# Patient Record
Sex: Male | Born: 1935 | Race: Black or African American | Hispanic: No | Marital: Married | Smoking: Former smoker
Health system: Southern US, Community
[De-identification: ages and names within clinical notes are randomized; demographics above are authoritative.]

## PROBLEM LIST (undated history)

## (undated) DIAGNOSIS — Z8546 Personal history of malignant neoplasm of prostate: Secondary | ICD-10-CM

## (undated) DIAGNOSIS — Z860100 Personal history of colon polyps, unspecified: Secondary | ICD-10-CM

## (undated) DIAGNOSIS — N281 Cyst of kidney, acquired: Secondary | ICD-10-CM

## (undated) DIAGNOSIS — E785 Hyperlipidemia, unspecified: Secondary | ICD-10-CM

## (undated) DIAGNOSIS — K219 Gastro-esophageal reflux disease without esophagitis: Secondary | ICD-10-CM

## (undated) DIAGNOSIS — I10 Essential (primary) hypertension: Secondary | ICD-10-CM

## (undated) DIAGNOSIS — I509 Heart failure, unspecified: Secondary | ICD-10-CM

## (undated) DIAGNOSIS — M1990 Unspecified osteoarthritis, unspecified site: Secondary | ICD-10-CM

## (undated) HISTORY — DX: Gastro-esophageal reflux disease without esophagitis: K21.9

## (undated) HISTORY — DX: Cyst of kidney, acquired: N28.1

## (undated) HISTORY — DX: Unspecified osteoarthritis, unspecified site: M19.90

## (undated) HISTORY — PX: COLONOSCOPY: SHX174

## (undated) HISTORY — DX: Personal history of malignant neoplasm of prostate: Z85.46

## (undated) HISTORY — DX: Personal history of colon polyps, unspecified: Z86.0100

## (undated) HISTORY — DX: Personal history of colonic polyps: Z86.010

## (undated) HISTORY — DX: Essential (primary) hypertension: I10

## (undated) HISTORY — DX: Hyperlipidemia, unspecified: E78.5

---

## 1996-10-07 ENCOUNTER — Encounter: Payer: Self-pay | Admitting: Family Medicine

## 1997-11-07 ENCOUNTER — Encounter: Payer: Self-pay | Admitting: Family Medicine

## 1997-11-07 LAB — CONVERTED CEMR LAB: PSA: 4.8 ng/mL

## 1998-12-08 ENCOUNTER — Encounter: Payer: Self-pay | Admitting: Family Medicine

## 1998-12-08 LAB — CONVERTED CEMR LAB: PSA: 6.2 ng/mL

## 1999-02-08 HISTORY — PX: PROSTATECTOMY: SHX69

## 1999-02-08 HISTORY — PX: INGUINAL HERNIA REPAIR: SUR1180

## 1999-02-12 ENCOUNTER — Other Ambulatory Visit: Admission: RE | Admit: 1999-02-12 | Discharge: 1999-02-12 | Payer: Self-pay | Admitting: Urology

## 1999-02-12 HISTORY — PX: CYSTOSCOPY: SUR368

## 1999-03-21 ENCOUNTER — Encounter: Payer: Self-pay | Admitting: Urology

## 1999-03-26 ENCOUNTER — Encounter (INDEPENDENT_AMBULATORY_CARE_PROVIDER_SITE_OTHER): Payer: Self-pay | Admitting: Specialist

## 1999-03-26 ENCOUNTER — Inpatient Hospital Stay (HOSPITAL_COMMUNITY): Admission: RE | Admit: 1999-03-26 | Discharge: 1999-03-29 | Payer: Self-pay | Admitting: General Surgery

## 2002-01-05 ENCOUNTER — Encounter: Payer: Self-pay | Admitting: Urology

## 2002-01-05 ENCOUNTER — Encounter: Admission: RE | Admit: 2002-01-05 | Discharge: 2002-01-05 | Payer: Self-pay | Admitting: Urology

## 2005-06-18 ENCOUNTER — Ambulatory Visit: Payer: Self-pay | Admitting: Internal Medicine

## 2005-06-23 ENCOUNTER — Ambulatory Visit: Payer: Self-pay | Admitting: Internal Medicine

## 2005-07-09 ENCOUNTER — Ambulatory Visit: Payer: Self-pay | Admitting: Family Medicine

## 2005-08-08 ENCOUNTER — Ambulatory Visit: Payer: Self-pay | Admitting: Family Medicine

## 2006-06-03 ENCOUNTER — Emergency Department (HOSPITAL_COMMUNITY): Admission: EM | Admit: 2006-06-03 | Discharge: 2006-06-03 | Payer: Self-pay | Admitting: Emergency Medicine

## 2006-08-11 ENCOUNTER — Ambulatory Visit: Payer: Self-pay | Admitting: Family Medicine

## 2006-08-18 ENCOUNTER — Ambulatory Visit: Payer: Self-pay | Admitting: Family Medicine

## 2006-08-18 LAB — CONVERTED CEMR LAB
BUN: 9 mg/dL (ref 6–23)
CO2: 30 meq/L (ref 19–32)
Calcium: 8.8 mg/dL (ref 8.4–10.5)
Chloride: 103 meq/L (ref 96–112)
Creatinine, Ser: 1.3 mg/dL (ref 0.4–1.5)
GFR calc Af Amer: 69 mL/min
GFR calc non Af Amer: 57 mL/min
Glucose, Bld: 99 mg/dL (ref 70–99)
Potassium: 3.3 meq/L — ABNORMAL LOW (ref 3.5–5.1)
Sodium: 141 meq/L (ref 135–145)

## 2006-09-24 ENCOUNTER — Ambulatory Visit: Payer: Self-pay | Admitting: Family Medicine

## 2006-09-24 LAB — CONVERTED CEMR LAB
BUN: 8 mg/dL (ref 6–23)
CO2: 30 meq/L (ref 19–32)
Calcium: 8.9 mg/dL (ref 8.4–10.5)
Chloride: 103 meq/L (ref 96–112)
Creatinine, Ser: 1.4 mg/dL (ref 0.4–1.5)
GFR calc Af Amer: 64 mL/min
GFR calc non Af Amer: 53 mL/min
Glucose, Bld: 106 mg/dL — ABNORMAL HIGH (ref 70–99)
Potassium: 3.7 meq/L (ref 3.5–5.1)
Sodium: 142 meq/L (ref 135–145)

## 2006-11-25 ENCOUNTER — Encounter: Payer: Self-pay | Admitting: Family Medicine

## 2006-11-25 DIAGNOSIS — E7800 Pure hypercholesterolemia, unspecified: Secondary | ICD-10-CM

## 2006-11-25 DIAGNOSIS — K219 Gastro-esophageal reflux disease without esophagitis: Secondary | ICD-10-CM | POA: Insufficient documentation

## 2006-11-25 DIAGNOSIS — R739 Hyperglycemia, unspecified: Secondary | ICD-10-CM

## 2006-11-25 DIAGNOSIS — A048 Other specified bacterial intestinal infections: Secondary | ICD-10-CM | POA: Insufficient documentation

## 2006-11-25 DIAGNOSIS — I10 Essential (primary) hypertension: Secondary | ICD-10-CM | POA: Insufficient documentation

## 2006-11-27 ENCOUNTER — Ambulatory Visit: Payer: Self-pay | Admitting: Family Medicine

## 2007-02-19 ENCOUNTER — Ambulatory Visit: Payer: Self-pay | Admitting: Family Medicine

## 2007-02-19 LAB — CONVERTED CEMR LAB
Albumin: 3.6 g/dL (ref 3.5–5.2)
Alkaline Phosphatase: 68 units/L (ref 39–117)
BUN: 9 mg/dL (ref 6–23)
Chloride: 106 meq/L (ref 96–112)
Creatinine, Ser: 1.3 mg/dL (ref 0.4–1.5)
Creatinine,U: 45.3 mg/dL
GFR calc non Af Amer: 57 mL/min
LDL Cholesterol: 115 mg/dL — ABNORMAL HIGH (ref 0–99)
Microalb, Ur: 5.6 mg/dL — ABNORMAL HIGH (ref 0.0–1.9)
Potassium: 3.8 meq/L (ref 3.5–5.1)
Sodium: 143 meq/L (ref 135–145)
TSH: 2.48 microintl units/mL (ref 0.35–5.50)
Total Bilirubin: 0.9 mg/dL (ref 0.3–1.2)
Triglycerides: 92 mg/dL (ref 0–149)
VLDL: 18 mg/dL (ref 0–40)

## 2007-02-23 ENCOUNTER — Ambulatory Visit: Payer: Self-pay | Admitting: Family Medicine

## 2007-02-23 DIAGNOSIS — Z87891 Personal history of nicotine dependence: Secondary | ICD-10-CM

## 2007-03-15 ENCOUNTER — Ambulatory Visit: Payer: Self-pay | Admitting: Family Medicine

## 2007-03-26 ENCOUNTER — Ambulatory Visit: Payer: Self-pay | Admitting: Family Medicine

## 2007-03-26 LAB — CONVERTED CEMR LAB
BUN: 12 mg/dL (ref 6–23)
CO2: 31 meq/L (ref 19–32)
Calcium: 9.2 mg/dL (ref 8.4–10.5)
GFR calc Af Amer: 63 mL/min
GFR calc non Af Amer: 52 mL/min
Potassium: 4.4 meq/L (ref 3.5–5.1)

## 2007-05-26 ENCOUNTER — Ambulatory Visit: Payer: Self-pay | Admitting: Family Medicine

## 2007-05-26 LAB — CONVERTED CEMR LAB
BUN: 10 mg/dL (ref 6–23)
Creatinine, Ser: 1.4 mg/dL (ref 0.4–1.5)
Potassium: 4.5 meq/L (ref 3.5–5.1)

## 2007-07-01 ENCOUNTER — Encounter: Payer: Self-pay | Admitting: Internal Medicine

## 2007-10-04 ENCOUNTER — Ambulatory Visit: Payer: Self-pay | Admitting: Family Medicine

## 2008-03-03 ENCOUNTER — Ambulatory Visit: Payer: Self-pay | Admitting: Family Medicine

## 2008-03-06 LAB — CONVERTED CEMR LAB
AST: 21 units/L (ref 0–37)
Albumin: 3.5 g/dL (ref 3.5–5.2)
Alkaline Phosphatase: 67 units/L (ref 39–117)
BUN: 11 mg/dL (ref 6–23)
Basophils Relative: 0.2 % (ref 0.0–3.0)
Creatinine, Ser: 1.4 mg/dL (ref 0.4–1.5)
Creatinine,U: 61.6 mg/dL
Eosinophils Absolute: 0.2 10*3/uL (ref 0.0–0.7)
Eosinophils Relative: 3.1 % (ref 0.0–5.0)
GFR calc Af Amer: 63 mL/min
Glucose, Bld: 103 mg/dL — ABNORMAL HIGH (ref 70–99)
HCT: 39.8 % (ref 39.0–52.0)
Hemoglobin: 13.8 g/dL (ref 13.0–17.0)
MCV: 89.5 fL (ref 78.0–100.0)
Microalb Creat Ratio: 76.3 mg/g — ABNORMAL HIGH (ref 0.0–30.0)
Monocytes Absolute: 0.8 10*3/uL (ref 0.1–1.0)
Monocytes Relative: 9.4 % (ref 3.0–12.0)
Neutro Abs: 5.8 10*3/uL (ref 1.4–7.7)
Platelets: 239 10*3/uL (ref 150–400)
Potassium: 4 meq/L (ref 3.5–5.1)
RBC: 4.44 M/uL (ref 4.22–5.81)
Total CHOL/HDL Ratio: 3.9
Total Protein: 7.5 g/dL (ref 6.0–8.3)
WBC: 8 10*3/uL (ref 4.5–10.5)

## 2008-03-09 ENCOUNTER — Ambulatory Visit: Payer: Self-pay | Admitting: Family Medicine

## 2008-03-09 DIAGNOSIS — K439 Ventral hernia without obstruction or gangrene: Secondary | ICD-10-CM | POA: Insufficient documentation

## 2008-03-24 ENCOUNTER — Ambulatory Visit: Payer: Self-pay | Admitting: Family Medicine

## 2008-03-24 LAB — CONVERTED CEMR LAB: OCCULT 3: NEGATIVE

## 2008-04-28 ENCOUNTER — Ambulatory Visit: Payer: Self-pay | Admitting: Family Medicine

## 2008-04-28 LAB — CONVERTED CEMR LAB
OCCULT 1: NEGATIVE
OCCULT 3: NEGATIVE

## 2008-04-28 LAB — FECAL OCCULT BLOOD, GUAIAC: Fecal Occult Blood: NEGATIVE

## 2008-05-01 ENCOUNTER — Encounter (INDEPENDENT_AMBULATORY_CARE_PROVIDER_SITE_OTHER): Payer: Self-pay | Admitting: *Deleted

## 2008-06-19 ENCOUNTER — Ambulatory Visit: Payer: Self-pay | Admitting: Family Medicine

## 2008-07-07 ENCOUNTER — Encounter: Payer: Self-pay | Admitting: Family Medicine

## 2009-01-03 ENCOUNTER — Encounter: Payer: Self-pay | Admitting: Family Medicine

## 2009-03-09 ENCOUNTER — Ambulatory Visit: Payer: Self-pay | Admitting: Family Medicine

## 2009-03-11 LAB — CONVERTED CEMR LAB
ALT: 12 units/L (ref 0–53)
BUN: 10 mg/dL (ref 6–23)
Basophils Absolute: 0 10*3/uL (ref 0.0–0.1)
Chloride: 106 meq/L (ref 96–112)
Cholesterol: 184 mg/dL (ref 0–200)
Creatinine,U: 36.6 mg/dL
Eosinophils Absolute: 0.1 10*3/uL (ref 0.0–0.7)
Glucose, Bld: 93 mg/dL (ref 70–99)
HCT: 42.2 % (ref 39.0–52.0)
Lymphs Abs: 1.1 10*3/uL (ref 0.7–4.0)
MCHC: 33 g/dL (ref 30.0–36.0)
MCV: 90.5 fL (ref 78.0–100.0)
Microalb Creat Ratio: 147.5 mg/g — ABNORMAL HIGH (ref 0.0–30.0)
Microalb, Ur: 5.4 mg/dL — ABNORMAL HIGH (ref 0.0–1.9)
Monocytes Absolute: 0.6 10*3/uL (ref 0.1–1.0)
Platelets: 174 10*3/uL (ref 150.0–400.0)
Potassium: 3.7 meq/L (ref 3.5–5.1)
RDW: 14.4 % (ref 11.5–14.6)
TSH: 3.01 microintl units/mL (ref 0.35–5.50)
Total Bilirubin: 0.8 mg/dL (ref 0.3–1.2)
Triglycerides: 80 mg/dL (ref 0.0–149.0)

## 2009-03-13 ENCOUNTER — Ambulatory Visit: Payer: Self-pay | Admitting: Family Medicine

## 2009-03-28 ENCOUNTER — Ambulatory Visit: Payer: Self-pay | Admitting: Family Medicine

## 2009-03-28 LAB — CONVERTED CEMR LAB: OCCULT 1: NEGATIVE

## 2009-04-03 ENCOUNTER — Telehealth: Payer: Self-pay | Admitting: Family Medicine

## 2009-04-24 ENCOUNTER — Ambulatory Visit: Payer: Self-pay | Admitting: Family Medicine

## 2009-04-24 LAB — CONVERTED CEMR LAB
BUN: 10 mg/dL (ref 6–23)
GFR calc non Af Amer: 68.64 mL/min (ref >60)
Glucose, Bld: 99 mg/dL (ref 70–99)
Potassium: 4 meq/L (ref 3.5–5.1)

## 2009-05-01 ENCOUNTER — Ambulatory Visit: Payer: Self-pay | Admitting: Family Medicine

## 2009-07-03 ENCOUNTER — Ambulatory Visit: Payer: Self-pay | Admitting: Urology

## 2009-07-03 ENCOUNTER — Encounter: Payer: Self-pay | Admitting: Family Medicine

## 2009-07-10 ENCOUNTER — Ambulatory Visit: Payer: Self-pay | Admitting: Family Medicine

## 2009-08-20 ENCOUNTER — Ambulatory Visit: Payer: Self-pay | Admitting: Family Medicine

## 2009-09-10 ENCOUNTER — Ambulatory Visit: Payer: Self-pay | Admitting: Family Medicine

## 2010-01-15 ENCOUNTER — Encounter (INDEPENDENT_AMBULATORY_CARE_PROVIDER_SITE_OTHER): Payer: Self-pay | Admitting: *Deleted

## 2010-07-09 NOTE — Letter (Signed)
Summary: Dr.[place] Humphries,[facility],Note  Dr.[place] Humphries,[facility],Note   Imported By: Virgia Land 07/03/2009 13:50:04  _____________________________________________________________________  External Attachment:    Type:   Image     Comment:   External Document  Appended Document: Dr.[place] Humphries,[facility],Note    Clinical Lists Changes  Problems: Added new problem of CONGEN POLYCYSTIC KIDNEY AUTOSOMAL DOMINANT, DR HUMPHRIES ([id number])

## 2010-07-09 NOTE — Assessment & Plan Note (Signed)
Summary: [place] F/U FOR BP CHECK / LFW   Vital Signs:  Patient profile:   77 year old male Weight:      166.50 pounds Temp:     97.9 degrees F oral Pulse rate:   80 / minute Pulse rhythm:   regular BP sitting:   120 / 74  (left arm) Cuff size:   regular  Vitals Entered By: Emelia Salisbury LPN (April  4, 624THL [id number] AM) CC: 2 Month follow-up on BP   History of Present Illness: Pt here for two month reche4ck of BP, tolerating his medication well. He was found to have shingles last time and is improving. The secondary bacrterial infection responded well to the Keflex and the lesions are resolving. He sdtill has a few large scabs in the lumbar area, the thigh area resolving.  Problems Prior to Update: 1)  Herpes Zoster, Right L3 Distr  (ICD-053.9) 2)  Congen Polycystic Kidney Autosomal Dominant, Dr Reece Agar  [phone]) 3)  Ventral Hernia, Asymptomatic  (ICD-553.20) 4)  Screening For Malignannt Neoplasm, Site Nec  (ICD-V76.49) 5)  Hypercholesterolemia  (ICD-272.0) 6)  Hyperglycemia  (ICD-790.6) 7)  Tobacco Abuse, Hx of  ([id number]) 8)  Helicobacter Pylori Infection  (ICD-041.86) 9)  Hypertension  (ICD-401.9) 10)  Gerd  (ICD-530.81) 11)  Prostate Cancer, Hx of (DR HUMPHRIES)  (ICD-V10.46)  Medications Prior to Update: 1)  Caduet 10-10 Mg Tabs (Amlodipine-Atorvastatin) .Marland Kitchen.. 1 By Mouth By Mouth At Night 2)  Furosemide 20 Mg Tabs (Furosemide) .... One Tab By Mouth Every Am 3)  Prilosec 20 Mg  Cpdr (Omeprazole) .Marland Kitchen.. 1 By Mouth Daily 4)  Klor-Con 10 10 Meq Cr-Tabs (Potassium Chloride) .Marland Kitchen.. 1 Daily By Mouth 5)  Lisinopril 20 Mg Tabs (Lisinopril) .... One Tab By Mouth Daily 6)  Vicodin 5-500 Mg Tabs (Hydrocodone-Acetaminophen) .... One Tab By Mouth Two Times A Day As Needed Pain From Shingles 7)  Keflex 250 Mg Caps (Cephalexin) .... One Tab By Mouth 4 Times A Day  Allergies: No Known Drug Allergies  Physical Exam  General:  Well-developed,well-nourished,in no acute distress;  alert,appropriate and cooperative throughout examination Head:  Normocephalic and atraumatic without obvious abnormalities. No apparent alopecia or balding. Eyes:  Conjunctiva clear bilaterally.  Ears:  External ear exam shows no significant lesions or deformities.  Otoscopic examination reveals clear canals, tympanic membranes are intact bilaterally without bulging, retraction, inflammation or discharge. Hearing is grossly normal bilaterally. Nose:  External nasal examination shows no deformity or inflammation. Nasal mucosa are pink and moist without lesions or exudates. Mouth:  Oral mucosa and oropharynx without lesions or exudates.  Teeth in mild  repair. Neck:  No deformities, masses, or tenderness noted. Lungs:  Normal respiratory effort, chest expands symmetrically. Lungs are clear to auscultation, no crackles or wheezes. Heart:  Normal rate and regular rhythm. S1 and S2 normal without gallop, murmur, click, rub or other extra sounds. Skin:  Lower back around to inguinal area and down the entire anterior right thigh, resolving with scabs on confluent areas of lower back but no new lesions seen.    Impression & Recommendations:  Problem # 1:  HYPERTENSION (ICD-401.9) Assessment Improved Stable, cont curr meds. His updated medication list for this problem includes:    Caduet 10-10 Mg Tabs (Amlodipine-atorvastatin) .Marland Kitchen... 1 by mouth by mouth at night    Furosemide 20 Mg Tabs (Furosemide) ..... One tab by mouth every am    Lisinopril 20 Mg Tabs (Lisinopril) ..... One tab by mouth daily    Metoprolol  Succinate 50 Mg Xr24h-tab (Metoprolol succinate) .Marland Kitchen... Take one by mouth every am  BP today: 120/74 Prior BP: 160/80 (08/20/2009)  Labs Reviewed: K+: 4.0 (04/24/2009) Creat: : 1.3 (04/24/2009)   Chol: 184 (03/09/2009)   HDL: 46.70 (03/09/2009)   LDL: 121 (03/09/2009)   TG: 80.0 (03/09/2009)  Problem # 2:  HERPES ZOSTER, RIGHT L3 DISTR (ICD-053.9) Assessment: Improved Resolving but still  hurting. Refill Vicodin but take as sparingly as possible.  Complete Medication List: 1)  Caduet 10-10 Mg Tabs (Amlodipine-atorvastatin) .Marland Kitchen.. 1 by mouth by mouth at night 2)  Furosemide 20 Mg Tabs (Furosemide) .... One tab by mouth every am 3)  Prilosec 20 Mg Cpdr (Omeprazole) .Marland Kitchen.. 1 by mouth daily 4)  Klor-con 10 10 Meq Cr-tabs (Potassium chloride) .Marland Kitchen.. 1 daily by mouth 5)  Lisinopril 20 Mg Tabs (Lisinopril) .... One tab by mouth daily 6)  Metoprolol Succinate 50 Mg Xr24h-tab (Metoprolol succinate) .... Take one by mouth every am 7)  Vicodin 5-500 Mg Tabs (Hydrocodone-acetaminophen) .... One tab by mouth two times a day as needed for shingles pain.  Patient Instructions: 1)  Zostavax next time if hasn't gotten it. 2)  Discussed discussing with his insurance. 3)  Call for appt in Fall for Comp Exam. Prescriptions: VICODIN 5-500 MG TABS (HYDROCODONE-ACETAMINOPHEN) one tab by mouth two times a day as needed for shingles pain.  #60 x 0   Entered and Authorized by:   Raenette Rover MD   Signed by:   Raenette Rover MD on 09/10/2009   Method used:   Print then Give to Patient   RxID:   [id number]   Current Allergies (reviewed today): No known allergies

## 2010-07-09 NOTE — Assessment & Plan Note (Signed)
Summary: RASH ON BACK AND LEGS/NT   Vital Signs:  Patient profile:   77 year old male Weight:      172 pounds Temp:     97.8 degrees F oral Pulse rate:   76 / minute Pulse rhythm:   regular BP sitting:   160 / 80  (left arm) Cuff size:   regular  Vitals Entered By: Emelia Salisbury LPN (March 14, 624THL [id number] AM) CC: Rash on legs and back, did not take his Caduet last night because he was out of it, will pick up today at the pharmacy   History of Present Illness: Pt here for rash which broke out last Wed or Thu, does not itch but is on his lowwer back and anterior thigh.  Problems Prior to Update: 1)  Congen Polycystic Kidney Autosomal Dominant, Dr Reece Agar  [phone]) 2)  Ventral Hernia, Asymptomatic  (ICD-553.20) 3)  Screening For Malignannt Neoplasm, Site Nec  (ICD-V76.49) 4)  Hypercholesterolemia  (ICD-272.0) 5)  Hyperglycemia  (ICD-790.6) 6)  Tobacco Abuse, Hx of  ([id number]) 7)  Helicobacter Pylori Infection  (ICD-041.86) 8)  Hypertension  (ICD-401.9) 9)  Gerd  (ICD-530.81) 10)  Prostate Cancer, Hx of (DR HUMPHRIES)  (ICD-V10.46)  Medications Prior to Update: 1)  Caduet 10-10 Mg Tabs (Amlodipine-Atorvastatin) .Marland Kitchen.. 1 By Mouth By Mouth At Night 2)  Furosemide 20 Mg Tabs (Furosemide) .... One Tab By Mouth Every Am 3)  Prilosec 20 Mg  Cpdr (Omeprazole) .Marland Kitchen.. 1 By Mouth Daily 4)  Klor-Con 10 10 Meq Cr-Tabs (Potassium Chloride) .Marland Kitchen.. 1 Daily By Mouth 5)  Lisinopril 20 Mg Tabs (Lisinopril) .... One Tab By Mouth Daily 6)  Metoprolol Succinate 50 Mg Xr24h-Tab (Metoprolol Succinate) .... Take One By Mouth Twice A Day. 7)  Amlodipine Besylate 10 Mg Tabs (Amlodipine Besylate) .... One Tab By Mouth At Night 8)  Lipitor 10 Mg Tabs (Atorvastatin Calcium) .... One Tab By Mouth At Night.  Allergies: No Known Drug Allergies  Physical Exam  General:  Well-developed,well-nourished,in no acute distress; alert,appropriate and cooperative throughout examination Head:  Normocephalic and  atraumatic without obvious abnormalities. No apparent alopecia or balding. Eyes:  Conjunctiva clear bilaterally.  Ears:  External ear exam shows no significant lesions or deformities.  Otoscopic examination reveals clear canals, tympanic membranes are intact bilaterally without bulging, retraction, inflammation or discharge. Hearing is grossly normal bilaterally. Nose:  External nasal examination shows no deformity or inflammation. Nasal mucosa are pink and moist without lesions or exudates. Mouth:  Oral mucosa and oropharynx without lesions or exudates.  Teeth in mild  repair. Neck:  No deformities, masses, or tenderness noted. Lungs:  Normal respiratory effort, chest expands symmetrically. Lungs are clear to auscultation, no crackles or wheezes. Heart:  Normal rate and regular rhythm. S1 and S2 normal without gallop, murmur, click, rub or other extra sounds. Skin:  Lower back around to inguinal area and down the entire anterior right thigh, veesicular clustered fuid filled lesions on erythematous base.    Impression & Recommendations:  Problem # 1:  HERPES ZOSTER, RIGHT L3 DISTR (ICD-053.9) Assessment New Too late for antiviral trmt.Will treat with Keflex for poss secondary celluilitis and use Vicodin sparingly, principally at night to help pain for sleep. Get shinngles shot after resolution .  Complete Medication List: 1)  Caduet 10-10 Mg Tabs (Amlodipine-atorvastatin) .Marland Kitchen.. 1 by mouth by mouth at night 2)  Furosemide 20 Mg Tabs (Furosemide) .... One tab by mouth every am 3)  Prilosec 20 Mg Cpdr (Omeprazole) .Marland Kitchen.. 1 by  mouth daily 4)  Klor-con 10 10 Meq Cr-tabs (Potassium chloride) .Marland Kitchen.. 1 daily by mouth 5)  Lisinopril 20 Mg Tabs (Lisinopril) .... One tab by mouth daily 6)  Vicodin 5-500 Mg Tabs (Hydrocodone-acetaminophen) .... One tab by mouth two times a day as needed pain from shingles 7)  Keflex 250 Mg Caps (Cephalexin) .... One tab by mouth 4 times a day Prescriptions: KEFLEX 250 MG  CAPS (CEPHALEXIN) one tab by mouth 4 times a day  #40 x 0   Entered and Authorized by:   Raenette Rover MD   Signed by:   Raenette Rover MD on 08/20/2009   Method used:   Print then Give to Patient   RxID:   [id number] VICODIN 5-500 MG TABS (HYDROCODONE-ACETAMINOPHEN) one tab by mouth two times a day as needed pain from shingles  #30 x 0   Entered and Authorized by:   Raenette Rover MD   Signed by:   Raenette Rover MD on 08/20/2009   Method used:   Print then Give to Patient   [id number]   Current Allergies (reviewed today): No known allergies

## 2010-07-09 NOTE — Assessment & Plan Note (Signed)
Summary: 2 MTH F/U / LFW   Vital Signs:  Patient profile:   77 year old male Weight:      168 pounds Temp:     98.2 degrees F oral Pulse rate:   80 / minute Pulse rhythm:   regular BP sitting:   132 / 80  (left arm) Cuff size:   regular  Vitals Entered By: Emelia Salisbury LPN (February  1, 624THL 10:02 AM) CC: 2 Month follow-up   History of Present Illness: He doesn't think his wife got the scripts last time filled which were to change from Caduet, an expensive medicine to Lipitor and Amlodipine individually. He does not have his meds with him but will check this when he gets home. he recently saw Dr Reece Agar and was told things are fine there. He is not due back for one year. He is urinating well. He has bno complaints and feels well.  Problems Prior to Update: 1)  Congen Polycystic Kidney Autosomal Dominant, Dr Reece Agar  [phone]) 2)  Ventral Hernia, Asymptomatic  (ICD-553.20) 3)  Screening For Malignannt Neoplasm, Site Nec  (ICD-V76.49) 4)  Hypercholesterolemia  (ICD-272.0) 5)  Hyperglycemia  (ICD-790.6) 6)  Tobacco Abuse, Hx of  ([id number]) 7)  Helicobacter Pylori Infection  (ICD-041.86) 8)  Hypertension  (ICD-401.9) 9)  Gerd  (ICD-530.81) 10)  Prostate Cancer, Hx of (DR HUMPHRIES)  (ICD-V10.46)  Medications Prior to Update: 1)  Caduet 10-10 Mg Tabs (Amlodipine-Atorvastatin) .Marland Kitchen.. 1 By Mouth By Mouth At Night 2)  Furosemide 20 Mg Tabs (Furosemide) .... One Tab By Mouth Every Am 3)  Prilosec 20 Mg  Cpdr (Omeprazole) .Marland Kitchen.. 1 By Mouth Daily 4)  Klor-Con 10 10 Meq Cr-Tabs (Potassium Chloride) .Marland Kitchen.. 1 Daily By Mouth 5)  Lisinopril 20 Mg Tabs (Lisinopril) .... One Tab By Mouth Daily 6)  Metoprolol Succinate 50 Mg Xr24h-Tab (Metoprolol Succinate) .... Take One By Mouth Twice A Day. 7)  Amlodipine Besylate 10 Mg Tabs (Amlodipine Besylate) .... One Tab By Mouth At Night 8)  Lipitor 10 Mg Tabs (Atorvastatin Calcium) .... One Tab By Mouth At Night.  Allergies: No Known Drug  Allergies  Physical Exam  General:  Well-developed,well-nourished,in no acute distress; alert,appropriate and cooperative throughout examination Head:  Normocephalic and atraumatic without obvious abnormalities. No apparent alopecia or balding. Eyes:  Conjunctiva clear bilaterally.  Ears:  External ear exam shows no significant lesions or deformities.  Otoscopic examination reveals clear canals, tympanic membranes are intact bilaterally without bulging, retraction, inflammation or discharge. Hearing is grossly normal bilaterally. Nose:  External nasal examination shows no deformity or inflammation. Nasal mucosa are pink and moist without lesions or exudates. Mouth:  Oral mucosa and oropharynx without lesions or exudates.  Teeth in mild  repair. Neck:  No deformities, masses, or tenderness noted. Chest Wall:  No deformities, masses, tenderness or gynecomastia noted. Lungs:  Normal respiratory effort, chest expands symmetrically. Lungs are clear to auscultation, no crackles or wheezes. Heart:  Normal rate and regular rhythm. S1 and S2 normal without gallop, murmur, click, rub or other extra sounds.   Impression & Recommendations:  Problem # 1:  HYPERTENSION (ICD-401.9) Assessment Improved  Finally well controlled. Will recheck next time. His updated medication list for this problem includes:    Caduet 10-10 Mg Tabs (Amlodipine-atorvastatin) .Marland Kitchen... 1 by mouth by mouth at night    Furosemide 20 Mg Tabs (Furosemide) ..... One tab by mouth every am    Lisinopril 20 Mg Tabs (Lisinopril) ..... One tab by mouth daily  Metoprolol Succinate 50 Mg Xr24h-tab (Metoprolol succinate) .Marland Kitchen... Take one by mouth twice a day.    Amlodipine Besylate 10 Mg Tabs (Amlodipine besylate) ..... One tab by mouth at night  BP today: 132/80 Prior BP: 142/82 (05/01/2009)  Labs Reviewed: K+: 4.0 (04/24/2009) Creat: : 1.3 (04/24/2009)   Chol: 184 (03/09/2009)   HDL: 46.70 (03/09/2009)   LDL: 121 (03/09/2009)   TG:  80.0 (03/09/2009)  Problem # 2:  CONGEN POLYCYSTIC KIDNEY AUTOSOMAL DOMINANT, DR HUMPHRIES [id number]) Assessment: Unchanged Per Dr Reece Agar. Seems stable.  Complete Medication List: 1)  Caduet 10-10 Mg Tabs (Amlodipine-atorvastatin) .Marland Kitchen.. 1 by mouth by mouth at night 2)  Furosemide 20 Mg Tabs (Furosemide) .... One tab by mouth every am 3)  Prilosec 20 Mg Cpdr (Omeprazole) .Marland Kitchen.. 1 by mouth daily 4)  Klor-con 10 10 Meq Cr-tabs (Potassium chloride) .Marland Kitchen.. 1 daily by mouth 5)  Lisinopril 20 Mg Tabs (Lisinopril) .... One tab by mouth daily 6)  Metoprolol Succinate 50 Mg Xr24h-tab (Metoprolol succinate) .... Take one by mouth twice a day. 7)  Amlodipine Besylate 10 Mg Tabs (Amlodipine besylate) .... One tab by mouth at night 8)  Lipitor 10 Mg Tabs (Atorvastatin calcium) .... One tab by mouth at night.  Patient Instructions: 1)  RTC 2 mos for BP check. 2)  Bring all medications along  Current Allergies (reviewed today): No known allergies

## 2010-07-09 NOTE — Letter (Signed)
Summary: Anabel Halon letter  [place] [facility]  [address] [place], [place] [address]   Phone: [phone]  Fax: [phone]       01/15/2010 MRN: [id number]  [facility] [place], [place]  [address]  Dear Mr. [place],  [place], and [place] announce the retirement of Modesto Charon, M.D., from full-time practice at the [facility] office effective December 06, 2009 and his plans of returning part-time.  It is important to Dr. Council Mechanic and to our practice that you understand that [facility] has seven physicians in our office for your health care needs.  We will continue to offer the same exceptional care that you have today.    Dr. Council Mechanic has spoken to many of you about his plans for retirement and returning part-time in the fall.   We will continue to work with you through the transition to schedule appointments for you in the office and meet the high standards that [facility] is committed to.   Again, it is with great pleasure that we share the news that Dr. Council Mechanic will return to [facility] in October of 2011 with a reduced schedule.    If you have any questions, or would like to request an appointment with one of our physicians, please call us at [phone] and press the option for Scheduling an appointment.  We take pleasure in providing you with excellent patient care and look forward to seeing you at your next office visit.  [facility] are:  Viviana Simpler, M.D. Teresa Pelton, M.D. Loura Pardon, M.D. Eliezer Lofts, M.D. Owens Loffler, M.D. Arnette Norris, M.D. We proudly welcomed Renford Dills, M.D. and Ria Bush, M.D. to the practice in July/August 2011.  Sincerely,  [facility]

## 2010-09-13 ENCOUNTER — Other Ambulatory Visit: Payer: Self-pay | Admitting: Family Medicine

## 2010-09-16 ENCOUNTER — Other Ambulatory Visit: Payer: Self-pay | Admitting: *Deleted

## 2010-09-16 MED ORDER — AMLODIPINE-ATORVASTATIN 10-10 MG PO TABS: 1.0000 | ORAL_TABLET | Freq: Every day | ORAL | Status: DC

## 2010-09-16 MED ORDER — POTASSIUM CHLORIDE 10 MEQ PO TBCR: 10.0000 meq | EXTENDED_RELEASE_TABLET | Freq: Every day | ORAL | Status: DC

## 2010-09-16 MED ORDER — FUROSEMIDE 20 MG PO TABS: 20.0000 mg | ORAL_TABLET | Freq: Every day | ORAL | Status: DC

## 2010-10-08 ENCOUNTER — Other Ambulatory Visit: Payer: Self-pay | Admitting: Family Medicine

## 2010-10-12 ENCOUNTER — Other Ambulatory Visit: Payer: Self-pay | Admitting: Family Medicine

## 2010-12-09 ENCOUNTER — Other Ambulatory Visit: Payer: Self-pay | Admitting: Family Medicine

## 2010-12-31 ENCOUNTER — Encounter: Payer: Self-pay | Admitting: Family Medicine

## 2011-01-01 ENCOUNTER — Ambulatory Visit: Payer: Self-pay | Admitting: Family Medicine

## 2011-01-09 ENCOUNTER — Ambulatory Visit (INDEPENDENT_AMBULATORY_CARE_PROVIDER_SITE_OTHER): Payer: Medicare Other | Admitting: Family Medicine

## 2011-01-09 ENCOUNTER — Encounter: Payer: Self-pay | Admitting: Family Medicine

## 2011-01-09 DIAGNOSIS — I10 Essential (primary) hypertension: Secondary | ICD-10-CM

## 2011-01-09 MED ORDER — LOSARTAN POTASSIUM 25 MG PO TABS: 25.0000 mg | ORAL_TABLET | Freq: Every day | ORAL | Status: DC

## 2011-01-09 MED ORDER — ZOSTER VACCINE LIVE 19400 UNT/0.65ML ~~LOC~~ SOLR: 0.6500 mL | Freq: Once | SUBCUTANEOUS | Status: DC

## 2011-01-09 NOTE — Patient Instructions (Addendum)
Please schedule fasting labs in three months and see me after. Start Cozaar instead of Lisinopril due to cough. Use prescription for Zostavax (Shingles shot) if so desired. Make appt after the beginning of the year to meet your new doctor and have Physical Exam approx 6/13.

## 2011-01-09 NOTE — Progress Notes (Signed)
  Subjective:    Patient ID: Rick [name], male    DOB: 05/29/1929, 77 y.o.   MRN: [id number]  HPIPt here for medication refills. He ran out of Lisinopril due to not being here in over a year and not renewing his medication. He was to get a PE last Fall but didn't come in. He has no complaints and feels ok. He has a cough that has been going on for a while. It has not stopped since he has been off the lisinopril for about two weeks.  He otherwise feels well and has no complaints.     Review of Systems  Constitutional: Negative for fever, chills, diaphoresis, activity change, appetite change and fatigue.  HENT: Negative for hearing loss, ear pain, congestion, sore throat, rhinorrhea, neck pain, neck stiffness, postnasal drip, sinus pressure, tinnitus and ear discharge.   Eyes: Negative for pain, discharge and visual disturbance.  Respiratory: Negative for cough, shortness of breath and wheezing.   Cardiovascular: Negative for chest pain and palpitations.       No SOB w/ exertion  Gastrointestinal:       No heartburn or swallowing problems.  Genitourinary:       No nocturia  Skin:       No itching or dryness.  Neurological:       No tingling or balance problems.  All other systems reviewed and are negative.       Objective:   Physical Exam  Constitutional: He appears well-developed and well-nourished. No distress.  HENT:  Head: Normocephalic and atraumatic.  Right Ear: External ear normal.  Left Ear: External ear normal.  Nose: Nose normal.  Mouth/Throat: Oropharynx is clear and moist.  Eyes: Conjunctivae and EOM are normal. Pupils are equal, round, and reactive to light. Right eye exhibits no discharge. Left eye exhibits no discharge.  Neck: Normal range of motion. Neck supple.  Cardiovascular: Normal rate and regular rhythm.   Pulmonary/Chest: Effort normal and breath sounds normal. He has no wheezes.  Lymphadenopathy:    He has no cervical adenopathy.  Skin: He is not  diaphoretic.          Assessment & Plan:

## 2011-01-09 NOTE — Assessment & Plan Note (Signed)
Stable. Will try switching ACEI to ARB in hopes of cough resolution. Start Cozaar. Will see back with labs prior.

## 2011-01-16 ENCOUNTER — Other Ambulatory Visit: Payer: Self-pay | Admitting: Family Medicine

## 2011-01-16 MED ORDER — POTASSIUM CHLORIDE CRYS ER 10 MEQ PO TBCR: 10.0000 meq | EXTENDED_RELEASE_TABLET | Freq: Every day | ORAL | Status: DC

## 2011-01-16 MED ORDER — FUROSEMIDE 20 MG PO TABS: 20.0000 mg | ORAL_TABLET | Freq: Every day | ORAL | Status: DC

## 2011-01-16 NOTE — Telephone Encounter (Addendum)
Received refill request electronically from pharmacy. The chart shows that Lisinopril, KlorCon and Caduet has been discontinued. Please advise.

## 2011-01-16 NOTE — Telephone Encounter (Signed)
Please insure ACEI cancelled at the drugstore and ARB filled from the other day with the other prescriptions done today.

## 2011-02-15 ENCOUNTER — Other Ambulatory Visit: Payer: Self-pay | Admitting: Family Medicine

## 2011-02-18 ENCOUNTER — Other Ambulatory Visit: Payer: Self-pay | Admitting: Family Medicine

## 2011-02-21 ENCOUNTER — Encounter: Payer: Self-pay | Admitting: Gastroenterology

## 2011-04-14 ENCOUNTER — Other Ambulatory Visit: Payer: Self-pay | Admitting: Family Medicine

## 2011-04-14 DIAGNOSIS — E7800 Pure hypercholesterolemia, unspecified: Secondary | ICD-10-CM

## 2011-04-14 DIAGNOSIS — Z87891 Personal history of nicotine dependence: Secondary | ICD-10-CM

## 2011-04-14 DIAGNOSIS — R7989 Other specified abnormal findings of blood chemistry: Secondary | ICD-10-CM

## 2011-04-17 ENCOUNTER — Other Ambulatory Visit (INDEPENDENT_AMBULATORY_CARE_PROVIDER_SITE_OTHER): Payer: Medicare Other

## 2011-04-17 DIAGNOSIS — Z87891 Personal history of nicotine dependence: Secondary | ICD-10-CM

## 2011-04-17 DIAGNOSIS — E7800 Pure hypercholesterolemia, unspecified: Secondary | ICD-10-CM

## 2011-04-17 DIAGNOSIS — R7989 Other specified abnormal findings of blood chemistry: Secondary | ICD-10-CM

## 2011-04-17 LAB — HEPATIC FUNCTION PANEL
Albumin: 3.7 g/dL (ref 3.5–5.2)
Alkaline Phosphatase: 58 U/L (ref 39–117)
Bilirubin, Direct: 0 mg/dL (ref 0.0–0.3)

## 2011-04-17 LAB — CBC WITH DIFFERENTIAL/PLATELET
Basophils Relative: 0.7 % (ref 0.0–3.0)
Eosinophils Relative: 2 % (ref 0.0–5.0)
Hemoglobin: 12.6 g/dL — ABNORMAL LOW (ref 13.0–17.0)
Lymphocytes Relative: 17.8 % (ref 12.0–46.0)
MCHC: 33.2 g/dL (ref 30.0–36.0)
Monocytes Relative: 8.8 % (ref 3.0–12.0)
Neutro Abs: 4.9 10*3/uL (ref 1.4–7.7)
RBC: 4.38 Mil/uL (ref 4.22–5.81)

## 2011-04-17 LAB — LIPID PANEL
HDL: 62.4 mg/dL (ref >39.00)
Total CHOL/HDL Ratio: 3
Triglycerides: 78 mg/dL (ref 0.0–149.0)
VLDL: 15.6 mg/dL (ref 0.0–40.0)

## 2011-04-17 LAB — MICROALBUMIN / CREATININE URINE RATIO: Microalb Creat Ratio: 7.9 mg/g (ref 0.0–30.0)

## 2011-04-17 LAB — RENAL FUNCTION PANEL
CO2: 28 mEq/L (ref 19–32)
Chloride: 104 mEq/L (ref 96–112)
GFR: 64.28 mL/min (ref >60.00)
Potassium: 3.9 mEq/L (ref 3.5–5.1)
Sodium: 140 mEq/L (ref 135–145)

## 2011-04-17 LAB — TSH: TSH: 2.32 u[IU]/mL (ref 0.35–5.50)

## 2011-04-23 ENCOUNTER — Ambulatory Visit (INDEPENDENT_AMBULATORY_CARE_PROVIDER_SITE_OTHER): Payer: Medicare Other | Admitting: Family Medicine

## 2011-04-23 ENCOUNTER — Encounter: Payer: Self-pay | Admitting: Family Medicine

## 2011-04-23 VITALS — BP 130/80 | HR 80 | Temp 97.7°F | Ht 67.0 in | Wt 175.0 lb

## 2011-04-23 DIAGNOSIS — I10 Essential (primary) hypertension: Secondary | ICD-10-CM

## 2011-04-23 DIAGNOSIS — E7800 Pure hypercholesterolemia, unspecified: Secondary | ICD-10-CM

## 2011-04-23 DIAGNOSIS — Z23 Encounter for immunization: Secondary | ICD-10-CM

## 2011-04-23 DIAGNOSIS — R7989 Other specified abnormal findings of blood chemistry: Secondary | ICD-10-CM

## 2011-04-23 NOTE — Progress Notes (Signed)
  Subjective:    Patient ID: Rick [name], male    DOB: 01-Dec-1928, 77 y.o.   MRN: [id number]  HPI Pt here for three month followup for BP control, switched tyo ARB after being off ACEI for two weeks with cough. He is tolerating the Cozaar well without any problems. Cough has lessened altho not resolved.    Review of Systems  Constitutional: Negative for fever, chills, diaphoresis, activity change, appetite change and fatigue.  HENT: Negative for hearing loss, ear pain, congestion, sore throat, rhinorrhea, neck pain, neck stiffness, postnasal drip, sinus pressure, tinnitus and ear discharge.   Eyes: Negative for pain, discharge and visual disturbance.  Respiratory: Negative for cough, shortness of breath and wheezing.   Cardiovascular: Negative for chest pain and palpitations.       No SOB w/ exertion  Gastrointestinal:       No heartburn or swallowing problems.  Genitourinary:       No nocturia  Skin:       No itching or dryness.  Neurological:       No tingling or balance problems.  All other systems reviewed and are negative.       Objective:   Physical Exam  Constitutional: He appears well-developed and well-nourished. No distress.  HENT:  Head: Normocephalic and atraumatic.  Right Ear: External ear normal.  Left Ear: External ear normal.  Nose: Nose normal.  Mouth/Throat: Oropharynx is clear and moist.  Eyes: Conjunctivae and EOM are normal. Pupils are equal, round, and reactive to light. Right eye exhibits no discharge. Left eye exhibits no discharge.  Neck: Normal range of motion. Neck supple.  Cardiovascular: Normal rate and regular rhythm.   Pulmonary/Chest: Effort normal and breath sounds normal. He has no wheezes.  Lymphadenopathy:    He has no cervical adenopathy.  Skin: He is not diaphoretic.          Assessment & Plan:

## 2011-04-23 NOTE — Assessment & Plan Note (Signed)
Still very slightly elevated. We discussed avoiding sweets and carbs to avoid accel to DM. Drink more water.

## 2011-04-23 NOTE — Assessment & Plan Note (Signed)
Good control. Discussed avoiding fatty foods.

## 2011-04-23 NOTE — Patient Instructions (Signed)
RTC in nine month for Comp Exam, labs prior with Dr [name]

## 2011-04-23 NOTE — Assessment & Plan Note (Signed)
Good control. Cont curr meds and followup in nine months. BP Readings from Last 3 Encounters:  04/23/11 130/80  01/09/11 130/80  09/10/09 120/74

## 2011-04-29 ENCOUNTER — Other Ambulatory Visit: Payer: Self-pay | Admitting: Family Medicine

## 2011-05-18 ENCOUNTER — Other Ambulatory Visit: Payer: Self-pay | Admitting: Family Medicine

## 2011-05-19 NOTE — Telephone Encounter (Signed)
Received refill request from pharmacy electronically. Please verify that patient is to continue medications.

## 2011-06-10 ENCOUNTER — Other Ambulatory Visit: Payer: Self-pay | Admitting: Family Medicine

## 2011-07-08 DIAGNOSIS — N182 Chronic kidney disease, stage 2 (mild): Secondary | ICD-10-CM | POA: Diagnosis not present

## 2011-07-08 DIAGNOSIS — Z8546 Personal history of malignant neoplasm of prostate: Secondary | ICD-10-CM | POA: Diagnosis not present

## 2011-07-08 DIAGNOSIS — N281 Cyst of kidney, acquired: Secondary | ICD-10-CM | POA: Diagnosis not present

## 2011-10-01 DIAGNOSIS — H40029 Open angle with borderline findings, high risk, unspecified eye: Secondary | ICD-10-CM | POA: Diagnosis not present

## 2011-10-01 DIAGNOSIS — H11009 Unspecified pterygium of unspecified eye: Secondary | ICD-10-CM | POA: Diagnosis not present

## 2011-10-01 DIAGNOSIS — H0230 Blepharochalasis unspecified eye, unspecified eyelid: Secondary | ICD-10-CM | POA: Diagnosis not present

## 2011-10-01 DIAGNOSIS — Z961 Presence of intraocular lens: Secondary | ICD-10-CM | POA: Diagnosis not present

## 2012-01-11 ENCOUNTER — Other Ambulatory Visit: Payer: Self-pay | Admitting: Family Medicine

## 2012-01-11 DIAGNOSIS — I10 Essential (primary) hypertension: Secondary | ICD-10-CM

## 2012-01-15 ENCOUNTER — Other Ambulatory Visit (INDEPENDENT_AMBULATORY_CARE_PROVIDER_SITE_OTHER): Payer: Medicare Other

## 2012-01-15 DIAGNOSIS — I10 Essential (primary) hypertension: Secondary | ICD-10-CM

## 2012-01-15 LAB — LIPID PANEL
Cholesterol: 165 mg/dL (ref 0–200)
HDL: 54 mg/dL (ref >39.00)
LDL Cholesterol: 94 mg/dL (ref 0–99)
VLDL: 17.4 mg/dL (ref 0.0–40.0)

## 2012-01-15 LAB — COMPREHENSIVE METABOLIC PANEL
Alkaline Phosphatase: 65 U/L (ref 39–117)
Creatinine, Ser: 1.4 mg/dL (ref 0.4–1.5)
Glucose, Bld: 101 mg/dL — ABNORMAL HIGH (ref 70–99)
Sodium: 138 mEq/L (ref 135–145)
Total Bilirubin: 0.6 mg/dL (ref 0.3–1.2)
Total Protein: 7.8 g/dL (ref 6.0–8.3)

## 2012-01-22 ENCOUNTER — Ambulatory Visit (INDEPENDENT_AMBULATORY_CARE_PROVIDER_SITE_OTHER): Payer: Medicare Other | Admitting: Family Medicine

## 2012-01-22 ENCOUNTER — Encounter: Payer: Self-pay | Admitting: Family Medicine

## 2012-01-22 VITALS — BP 152/70 | HR 68 | Temp 97.5°F | Wt 171.0 lb

## 2012-01-22 DIAGNOSIS — Z1211 Encounter for screening for malignant neoplasm of colon: Secondary | ICD-10-CM

## 2012-01-22 DIAGNOSIS — E7800 Pure hypercholesterolemia, unspecified: Secondary | ICD-10-CM

## 2012-01-22 DIAGNOSIS — I10 Essential (primary) hypertension: Secondary | ICD-10-CM

## 2012-01-22 DIAGNOSIS — Z0000 Encounter for general adult medical examination without abnormal findings: Secondary | ICD-10-CM

## 2012-01-22 MED ORDER — FUROSEMIDE 20 MG PO TABS: ORAL_TABLET | ORAL | Status: DC

## 2012-01-22 MED ORDER — ZOSTER VACCINE LIVE 19400 UNT/0.65ML ~~LOC~~ SOLR: 0.6500 mL | Freq: Once | SUBCUTANEOUS | Status: DC

## 2012-01-22 MED ORDER — AMLODIPINE-ATORVASTATIN 10-10 MG PO TABS: ORAL_TABLET | ORAL | Status: DC

## 2012-01-22 MED ORDER — METOPROLOL SUCCINATE ER 50 MG PO TB24: ORAL_TABLET | ORAL | Status: DC

## 2012-01-22 MED ORDER — LOSARTAN POTASSIUM 25 MG PO TABS: ORAL_TABLET | ORAL | Status: DC

## 2012-01-22 MED ORDER — POTASSIUM CHLORIDE CRYS ER 10 MEQ PO TBCR: 10.0000 meq | EXTENDED_RELEASE_TABLET | Freq: Every day | ORAL | Status: DC

## 2012-01-22 NOTE — Patient Instructions (Addendum)
I would get a flu shot each fall.   Get the shingles shot at the pharmacy.  Talk to your wife about what you would want done if you got so sick that you couldn't talk.   Try to cut back on salt.   Recheck labs in 1 year at a physical.

## 2012-01-22 NOTE — Progress Notes (Signed)
I have personally reviewed the Medicare Annual Wellness questionnaire and have noted 1. The patient's medical and social history 2. Their use of alcohol, tobacco or illicit drugs 3. Their current medications and supplements 4. The patient's functional ability including ADL's, fall risks, home safety risks and hearing or visual             impairment. 5. Diet and physical activities 6. Evidence for depression or mood disorders  The patients weight, height, BMI have been recorded in the chart and visual acuity is per eye clinic.  I have made referrals, counseling and provided education to the patient based review of the above and I have provided the pt with a written personalized care plan for preventive services.  See scanned forms.  Routine anticipatory guidance given to patient.  See health maintenance. Tetanus 2009 Flu yearly Shingles encouraged PNA 2002 D/w patient [id number] for colon cancer screening, including IFOB vs. colonoscopy.  Risks and benefits of both were discussed and patient voiced understanding.  Pt elects [id number].   Prostate cancer, s/p prostate surgery.  Deferred to uro.   Advance directive- d/w pt about living will.    Hypertension:    Using medication without problems or lightheadedness: yes Chest pain with exertion:no Edema:no Short of breath:no  Elevated Cholesterol: Using medications without problems:yes Muscle aches: no Diet compliance: "I like to eat."  "I'm trying." Exercise: some, walking  PMH and SH reviewed  Meds, vitals, and allergies reviewed.   ROS: See HPI.  Otherwise negative.    GEN: nad, alert and oriented HEENT: mucous membranes moist NECK: supple w/o LA CV: rrr. PULM: ctab, no inc wob ABD: soft, +bs EXT: trace edema SKIN: no acute rash

## 2012-01-23 DIAGNOSIS — Z0000 Encounter for general adult medical examination without abnormal findings: Secondary | ICD-10-CM | POA: Insufficient documentation

## 2012-01-23 NOTE — Assessment & Plan Note (Signed)
Mild elevation but I don't want to induce hypotension.  Avoid salt, continue current meds.  Labs d/w pt.

## 2012-01-23 NOTE — Assessment & Plan Note (Signed)
Controlled, continue current meds. Labs d/w pt.  

## 2012-01-28 ENCOUNTER — Telehealth: Payer: Self-pay | Admitting: Radiology

## 2012-01-28 ENCOUNTER — Other Ambulatory Visit: Payer: Medicare Other

## 2012-01-28 DIAGNOSIS — R195 Other fecal abnormalities: Secondary | ICD-10-CM

## 2012-01-28 DIAGNOSIS — Z1211 Encounter for screening for malignant neoplasm of colon: Secondary | ICD-10-CM

## 2012-01-28 LAB — FECAL OCCULT BLOOD, IMMUNOCHEMICAL: Fecal Occult Bld: POSITIVE

## 2012-01-28 NOTE — Telephone Encounter (Signed)
Noted. Please call pt.  I would have him see GI.  Referral is in.  Thanks.

## 2012-01-28 NOTE — Telephone Encounter (Signed)
[facility] called results of an ifob- positive

## 2012-01-29 NOTE — Telephone Encounter (Signed)
LMOVM to return call.

## 2012-02-03 NOTE — Telephone Encounter (Signed)
Rick [name] has scheduled appt.

## 2012-02-11 ENCOUNTER — Other Ambulatory Visit: Payer: Self-pay | Admitting: Family Medicine

## 2012-02-18 ENCOUNTER — Other Ambulatory Visit: Payer: Self-pay | Admitting: Family Medicine

## 2012-03-04 ENCOUNTER — Ambulatory Visit (INDEPENDENT_AMBULATORY_CARE_PROVIDER_SITE_OTHER): Payer: Medicare Other | Admitting: Gastroenterology

## 2012-03-04 ENCOUNTER — Encounter: Payer: Self-pay | Admitting: Gastroenterology

## 2012-03-04 ENCOUNTER — Other Ambulatory Visit (INDEPENDENT_AMBULATORY_CARE_PROVIDER_SITE_OTHER): Payer: Medicare Other

## 2012-03-04 VITALS — BP 138/72 | HR 88 | Ht 67.0 in | Wt 171.2 lb

## 2012-03-04 DIAGNOSIS — R195 Other fecal abnormalities: Secondary | ICD-10-CM | POA: Insufficient documentation

## 2012-03-04 DIAGNOSIS — Z23 Encounter for immunization: Secondary | ICD-10-CM | POA: Diagnosis not present

## 2012-03-04 DIAGNOSIS — Z800 Family history of malignant neoplasm of digestive organs: Secondary | ICD-10-CM | POA: Insufficient documentation

## 2012-03-04 LAB — CBC WITH DIFFERENTIAL/PLATELET
Basophils Relative: 0.4 % (ref 0.0–3.0)
Eosinophils Absolute: 0.1 10*3/uL (ref 0.0–0.7)
Eosinophils Relative: 1 % (ref 0.0–5.0)
Hemoglobin: 12.9 g/dL — ABNORMAL LOW (ref 13.0–17.0)
Lymphocytes Relative: 12.3 % (ref 12.0–46.0)
MCHC: 32.6 g/dL (ref 30.0–36.0)
Monocytes Relative: 6.3 % (ref 3.0–12.0)
Neutro Abs: 8.6 10*3/uL — ABNORMAL HIGH (ref 1.4–7.7)
Neutrophils Relative %: 80 % — ABNORMAL HIGH (ref 43.0–77.0)
RBC: 4.55 Mil/uL (ref 4.22–5.81)
WBC: 10.8 10*3/uL — ABNORMAL HIGH (ref 4.5–10.5)

## 2012-03-04 MED ORDER — NA SULFATE-K SULFATE-MG SULF 17.5-3.13-1.6 GM/177ML PO SOLN: ORAL | Status: DC

## 2012-03-04 NOTE — Assessment & Plan Note (Addendum)
Heme positive stool could be due to an occult malignancy. I feel a mass in the right lower quadrant that could represent a neoplasm. Occult bleeding from polyps,  AVMs or neoplasm are considerations. An upper GI source from ulcer disease is less likely in the face of PPI therapy.  Recommendations #1 CBC #2 colonoscopy #3 to consider upper endoscopy if colonoscopy is negative and CBC demonstrates a worsening anemia

## 2012-03-04 NOTE — Assessment & Plan Note (Signed)
Plan colonoscopy 

## 2012-03-04 NOTE — Progress Notes (Signed)
History of Present Illness: Pleasant 78 year old Afro-American male referred at the request of Dr. [name] for evaluation of Hemoccult-positive stool. This was noted on routine testing. [name] has no GI complaints including change of bowel habits, abdominal pain, melena or hematochezia. He is on no gastric irritants. At last CBC in November, 2012  hemoglobin was 12.6.  Family history is pertinent for a sister with colon cancer    Past Medical History  Diagnosis Date  . History of prostate cancer   . GERD (gastroesophageal reflux disease)   . Hypertension   . Kidney cysts     bilateral.  Renal US  . Hyperlipidemia   . Arthritis    Past Surgical History  Procedure Date  . Cystoscopy 02/12/99    biopsy  . Prostatectomy 02/1999  . Inguinal hernia repair 02/1999    Dr. Reece Agar   family history includes Bone cancer in his sister; Colon cancer in his other and sister; Lung cancer in his sister; Stomach cancer in his brothers; and Stroke in his mother. Current Outpatient Prescriptions  Medication Sig Dispense Refill  . amlodipine-atorvastatin (CADUET) 10-10 MG per tablet TAKE 1 TABLET EVERY DAY  30 tablet  12  . furosemide (LASIX) 20 MG tablet TAKE 1 TABLET EVERY MORNING  30 tablet  12  . losartan (COZAAR) 25 MG tablet TAKE 1 TABLET BY MOUTH EVERY DAY  30 tablet  12  . metoprolol succinate (TOPROL-XL) 50 MG 24 hr tablet Take with or immediately following a meal.  30 tablet  12  . omeprazole (PRILOSEC) 20 MG capsule Take 20 mg by mouth daily.        . potassium chloride (KLOR-CON M10) 10 MEQ tablet Take 1 tablet (10 mEq total) by mouth daily.  30 tablet  12  . zoster vaccine live, PF, (ZOSTAVAX) 09811 UNT/0.65ML injection Inject 19,400 Units into the skin once.  1 vial  0   Allergies as of 03/04/2012  . (No Known Allergies)    reports that he has quit smoking. His smokeless tobacco use includes Chew. He reports that he drinks alcohol. He reports that he does not use illicit  drugs.     Review of Systems: Pertinent positive and negative review of systems were noted in the above HPI section. All other review of systems were otherwise negative.  Vital signs were reviewed in today's medical record Physical Exam: General: Well developed , well nourished, no acute distress Head: Normocephalic and atraumatic Eyes:  sclerae anicteric, EOMI Ears: Normal auditory acuity Mouth: No deformity or lesions Neck: Supple, no masses or thyromegaly Lungs: Clear throughout to auscultation Heart: Regular rate and rhythm; no murmurs, rubs or bruits Abdomen: Soft, non tender and non distended. Nohepatosplenomegaly or hernias noted. Normal Bowel sounds. He has a palpable mass in the right lower quadrant that is non-fixed and measures at least 2 x 4 cm. This may represent a mobile right colon Rectal:deferred Musculoskeletal: Symmetrical with no gross deformities  Skin: No lesions on visible extremities Pulses:  Normal pulses noted Extremities: No clubbing, cyanosis,  or deformities noted; there is 2-3+ ankle edema Neurological: Alert oriented x 4, grossly nonfocal Cervical Nodes:  No significant cervical adenopathy Inguinal Nodes: No significant inguinal adenopathy Psychological:  Alert and cooperative. Normal mood and affect

## 2012-03-04 NOTE — Patient Instructions (Addendum)
You have been given a separate informational sheet regarding your tobacco use, the importance of quitting and local resources to help you quit.  You have been scheduled for a colonoscopy with propofol. Please follow written instructions given to you at your visit today.  Please pick up your prep kit at the pharmacy within the next 1-3 days. If you use inhalers (even only as needed), please bring them with you on the day of your procedure.  Your physician has requested that you go to the basement for lab work before leaving today:

## 2012-03-14 ENCOUNTER — Other Ambulatory Visit: Payer: Self-pay | Admitting: Family Medicine

## 2012-03-15 NOTE — Telephone Encounter (Signed)
Deny.  Was prev stopped 01/09/11.

## 2012-03-15 NOTE — Telephone Encounter (Signed)
This medication is not on the current meds list.  Please advise.

## 2012-03-18 ENCOUNTER — Telehealth: Payer: Self-pay

## 2012-03-18 NOTE — Telephone Encounter (Signed)
Stay off lisinopril for now.  Get OV to work on this.  Have him bring all meds/pills/bottles to the visit.  We don't need to have him on both meds.  We can adjust the losartan as needed at the [facility].  Thanks.

## 2012-03-18 NOTE — Telephone Encounter (Signed)
LM with wife to return call to schedule OV and be sure to ask him to bring meds/bottles/pills.

## 2012-03-18 NOTE — Telephone Encounter (Signed)
pts wife called pt is taking lisinopril 20 mg one daily. Spoke with [facility] and lisinopril refilled in 06/2011 with 8 refills. Pt has picked up med each month since 06/2011.  On 03/14/12 refill request Dr [name] denied; pt supposed to stop 01/2011. Pt also taking Losartan 25 mg one daily.[facility]. Pt does not take BP at home but pt seen Dr Eliezer Mccoy 03/04/12 BP 138/72. Please advise.

## 2012-03-19 NOTE — Telephone Encounter (Signed)
Pt's wire returned call, I advised pt and her and scheduled appt for him on 03/22/2012, he will bring all his meds with him and not take lisinopril.

## 2012-03-22 ENCOUNTER — Encounter: Payer: Self-pay | Admitting: Family Medicine

## 2012-03-22 ENCOUNTER — Other Ambulatory Visit: Payer: Self-pay | Admitting: *Deleted

## 2012-03-22 ENCOUNTER — Ambulatory Visit (INDEPENDENT_AMBULATORY_CARE_PROVIDER_SITE_OTHER): Payer: Medicare Other | Admitting: Family Medicine

## 2012-03-22 VITALS — BP 142/80 | HR 63 | Temp 98.2°F | Wt 172.0 lb

## 2012-03-22 DIAGNOSIS — I10 Essential (primary) hypertension: Secondary | ICD-10-CM | POA: Diagnosis not present

## 2012-03-22 NOTE — Progress Notes (Signed)
Hypertension:    Using medication without problems or lightheadedness: yes Chest pain with exertion:no Edema:no Short of breath:no Average home BPs:  Prev ACE cough, improved off medicine. We clarified today.  He hasn't been on lisinopril for about 1 year.    Meds, vitals, and allergies reviewed.   ROS: See HPI.  Otherwise negative.    GEN: nad, alert and oriented HEENT: mucous membranes moist NECK: supple w/o LA CV: rrr. PULM: ctab, no inc wob ABD: soft, +bs EXT: trace BLE edema SKIN: no acute rash

## 2012-03-22 NOTE — Patient Instructions (Addendum)
Don't change your meds.  Take care.  Glad to see you.

## 2012-03-23 ENCOUNTER — Encounter: Payer: Self-pay | Admitting: Gastroenterology

## 2012-03-23 ENCOUNTER — Ambulatory Visit (AMBULATORY_SURGERY_CENTER): Payer: Medicare Other | Admitting: Gastroenterology

## 2012-03-23 VITALS — BP 137/76 | HR 60 | Temp 97.7°F | Resp 38 | Ht 67.0 in | Wt 171.0 lb

## 2012-03-23 DIAGNOSIS — R195 Other fecal abnormalities: Secondary | ICD-10-CM

## 2012-03-23 DIAGNOSIS — K5520 Angiodysplasia of colon without hemorrhage: Secondary | ICD-10-CM

## 2012-03-23 DIAGNOSIS — N281 Cyst of kidney, acquired: Secondary | ICD-10-CM | POA: Diagnosis not present

## 2012-03-23 DIAGNOSIS — K219 Gastro-esophageal reflux disease without esophagitis: Secondary | ICD-10-CM | POA: Diagnosis not present

## 2012-03-23 DIAGNOSIS — Z800 Family history of malignant neoplasm of digestive organs: Secondary | ICD-10-CM | POA: Diagnosis not present

## 2012-03-23 DIAGNOSIS — D126 Benign neoplasm of colon, unspecified: Secondary | ICD-10-CM

## 2012-03-23 DIAGNOSIS — M129 Arthropathy, unspecified: Secondary | ICD-10-CM | POA: Diagnosis not present

## 2012-03-23 DIAGNOSIS — Z8546 Personal history of malignant neoplasm of prostate: Secondary | ICD-10-CM | POA: Diagnosis not present

## 2012-03-23 DIAGNOSIS — I10 Essential (primary) hypertension: Secondary | ICD-10-CM | POA: Diagnosis not present

## 2012-03-23 DIAGNOSIS — E785 Hyperlipidemia, unspecified: Secondary | ICD-10-CM | POA: Diagnosis not present

## 2012-03-23 MED ORDER — SODIUM CHLORIDE 0.9 % IV SOLN: 500.0000 mL | INTRAVENOUS | Status: DC

## 2012-03-23 NOTE — Progress Notes (Signed)
Patient did not experience any of the following events: a burn prior to discharge; a fall within the facility; wrong site/side/patient/procedure/implant event; or a hospital transfer or hospital admission upon discharge from the facility. (G8907) Patient did not have preoperative order for IV antibiotic SSI prophylaxis. (G8918)  

## 2012-03-23 NOTE — Assessment & Plan Note (Signed)
bp controlled on ARB.  Cough resolved off ACE.  We'll clarify with pharmacy.  Continue current meds.  He agrees.  Doing well.  He has flu shot prev at pharmacy.

## 2012-03-23 NOTE — Patient Instructions (Addendum)
Impressions/Recommendations:  Polyp (handout given) Diverticulosis (handout given) High Fiber Diet (handout given)  Hemoccult cards to be completed after two weeks time. Start collecting after 04/06/12.  YOU HAD AN ENDOSCOPIC PROCEDURE TODAY AT [facility]: Refer to the procedure report that was given to you for any specific questions about what was found during the examination.  If the procedure report does not answer your questions, please call your gastroenterologist to clarify.  If you requested that your care partner not be given the details of your procedure findings, then the procedure report has been included in a sealed envelope for you to review at your convenience later.  YOU SHOULD EXPECT: Some feelings of bloating in the abdomen. Passage of more gas than usual.  Walking can help get rid of the air that was put into your GI tract during the procedure and reduce the bloating. If you had a lower endoscopy (such as a colonoscopy or flexible sigmoidoscopy) you may notice spotting of blood in your stool or on the toilet paper. If you underwent a bowel prep for your procedure, then you may not have a normal bowel movement for a few days.  DIET: Your first meal following the procedure should be a light meal and then it is ok to progress to your normal diet.  A half-sandwich or bowl of soup is an example of a good first meal.  Heavy or fried foods are harder to digest and may make you feel nauseous or bloated.  Likewise meals heavy in dairy and vegetables can cause extra gas to form and this can also increase the bloating.  Drink plenty of fluids but you should avoid alcoholic beverages for 24 hours.  ACTIVITY: Your care partner should take you home directly after the procedure.  You should plan to take it easy, moving slowly for the rest of the day.  You can resume normal activity the day after the procedure however you should NOT DRIVE or use heavy machinery for 24 hours (because  of the sedation medicines used during the test).    SYMPTOMS TO REPORT IMMEDIATELY: A gastroenterologist can be reached at any hour.  During normal business hours, 8:30 AM to 5:00 PM Monday through Friday, call [phone].  After hours and on weekends, please call the GI answering service at [phone] who will take a message and have the physician on call contact you.   Following lower endoscopy (colonoscopy or flexible sigmoidoscopy):  Excessive amounts of blood in the stool  Significant tenderness or worsening of abdominal pains  Swelling of the abdomen that is new, acute  Fever of 100F or higher   FOLLOW UP: If any biopsies were taken you will be contacted by phone or by letter within the next 1-3 weeks.  Call your gastroenterologist if you have not heard about the biopsies in 3 weeks.  Our staff will call the home number listed on your records the next business day following your procedure to check on you and address any questions or concerns that you may have at that time regarding the information given to you following your procedure. This is a courtesy call and so if there is no answer at the home number and we have not heard from you through the emergency physician on call, we will assume that you have returned to your regular daily activities without incident.  SIGNATURES/CONFIDENTIALITY: You and/or your care partner have signed paperwork which will be entered into your electronic medical record.  These  signatures attest to the fact that that the information above on your After Visit Summary has been reviewed and is understood.  Full responsibility of the confidentiality of this discharge information lies with you and/or your care-partner.

## 2012-03-23 NOTE — Op Note (Signed)
[place]  [address]. [place], [address]   COLONOSCOPY PROCEDURE REPORT  PATIENT: Rick, [name]  MR#: [id number] BIRTHDATE: March 01, 1929 , 46  yrs. old GENDER: Male ENDOSCOPIST: Inda Castle, MD REFERRED [id number] Duncan, M.D. PROCEDURE DATE:  03/23/2012 PROCEDURE:   Colonoscopy with snare polypectomy ASA CLASS:   Class III INDICATIONS:heme-positive stool and patient's immediate family history of colon cancer. MEDICATIONS: MAC sedation, administered by CRNA and propofol (Diprivan) 200mg  IV  DESCRIPTION OF PROCEDURE:   After the risks benefits and alternatives of the procedure were thoroughly explained, informed consent was obtained.  A digital rectal exam revealed no abnormalities of the rectum.   The LB [id number]  endoscope was introduced through the anus and advanced to the cecum, which was identified by both the appendix and ileocecal valve. No adverse events experienced.   The quality of the prep was Suprep good  The instrument was then slowly withdrawn as the colon was fully examined.      COLON FINDINGS: An angiodysplastic lesion was found.   A single AVM measuring 3-4 mm was seen in the cecum.  There was no blood.  in the transverse colon a flat 3 mm polyp was identified and removed with cold polypectomy snare and submitted to pathology.  moderately severe diverticulosis was seen in the sigmoid colon.  there were internal hemorrhoids.  The remainder of the exam was entirely normal.  Retroflexed views revealed no abnormalities. The time to cecum=9 minutes 17 seconds.  Withdrawal time=11 minutes 56 seconds. The scope was withdrawn and the procedure completed. COMPLICATIONS: There were no complications.  ENDOSCOPIC IMPRESSION: 1.   Angiodysplastic lesion 2.  colon polyp 3.  diverticulosis  RECOMMENDATIONS: The cecal AVM could be responsible for Hemoccult-positive stools. it may be a marker for other AVMs.  recommend followup  Hemoccults in 2 weeks  eSigned:  Inda Castle, MD 03/23/2012 3:34 PM   cc:   PATIENT NAME:  Rick, [name] MR#: [id number]

## 2012-03-24 ENCOUNTER — Telehealth: Payer: Self-pay | Admitting: *Deleted

## 2012-03-24 NOTE — Telephone Encounter (Signed)
No answer, message left for the patient. 

## 2012-03-30 DIAGNOSIS — H0230 Blepharochalasis unspecified eye, unspecified eyelid: Secondary | ICD-10-CM | POA: Diagnosis not present

## 2012-03-30 DIAGNOSIS — H11009 Unspecified pterygium of unspecified eye: Secondary | ICD-10-CM | POA: Diagnosis not present

## 2012-03-30 DIAGNOSIS — Z961 Presence of intraocular lens: Secondary | ICD-10-CM | POA: Diagnosis not present

## 2012-03-30 DIAGNOSIS — H40029 Open angle with borderline findings, high risk, unspecified eye: Secondary | ICD-10-CM | POA: Diagnosis not present

## 2012-04-02 ENCOUNTER — Other Ambulatory Visit: Payer: Medicare Other

## 2012-04-05 ENCOUNTER — Encounter: Payer: Self-pay | Admitting: Gastroenterology

## 2012-04-07 ENCOUNTER — Other Ambulatory Visit: Payer: Self-pay | Admitting: Gastroenterology

## 2012-04-14 ENCOUNTER — Telehealth: Payer: Self-pay | Admitting: Gastroenterology

## 2012-04-14 DIAGNOSIS — R195 Other fecal abnormalities: Secondary | ICD-10-CM

## 2012-04-14 NOTE — Telephone Encounter (Signed)
Spoke with patient and gave him results. Scheduled for repeat CBC. Upper Endoscopy with enteroscopy scheduled on 04/27/12  3:00/4:00 PM. Pre visit 11;14;13 10:00 AM.

## 2012-04-22 ENCOUNTER — Ambulatory Visit (AMBULATORY_SURGERY_CENTER): Payer: Medicare Other

## 2012-04-22 VITALS — Ht 68.5 in | Wt 173.7 lb

## 2012-04-22 DIAGNOSIS — R195 Other fecal abnormalities: Secondary | ICD-10-CM

## 2012-04-22 DIAGNOSIS — K219 Gastro-esophageal reflux disease without esophagitis: Secondary | ICD-10-CM

## 2012-04-27 ENCOUNTER — Ambulatory Visit (AMBULATORY_SURGERY_CENTER): Payer: Medicare Other | Admitting: Gastroenterology

## 2012-04-27 ENCOUNTER — Encounter: Payer: Self-pay | Admitting: Gastroenterology

## 2012-04-27 VITALS — BP 162/79 | HR 58 | Temp 98.4°F | Resp 20 | Ht 68.5 in | Wt 173.0 lb

## 2012-04-27 DIAGNOSIS — K219 Gastro-esophageal reflux disease without esophagitis: Secondary | ICD-10-CM

## 2012-04-27 DIAGNOSIS — D139 Benign neoplasm of ill-defined sites within the digestive system: Secondary | ICD-10-CM | POA: Diagnosis not present

## 2012-04-27 DIAGNOSIS — D131 Benign neoplasm of stomach: Secondary | ICD-10-CM

## 2012-04-27 DIAGNOSIS — R195 Other fecal abnormalities: Secondary | ICD-10-CM

## 2012-04-27 DIAGNOSIS — A048 Other specified bacterial intestinal infections: Secondary | ICD-10-CM | POA: Diagnosis not present

## 2012-04-27 MED ORDER — SODIUM CHLORIDE 0.9 % IV SOLN: 500.0000 mL | INTRAVENOUS | Status: DC

## 2012-04-27 NOTE — Progress Notes (Signed)
Propofol given over incremental dosages 

## 2012-04-27 NOTE — Patient Instructions (Addendum)

## 2012-04-27 NOTE — Progress Notes (Signed)
Patient did not experience any of the following events: a burn prior to discharge; a fall within the facility; wrong site/side/patient/procedure/implant event; or a hospital transfer or hospital admission upon discharge from the facility. (G8907) Patient did not have preoperative order for IV antibiotic SSI prophylaxis. (G8918)   Charted by April Mirts RN 

## 2012-04-27 NOTE — Op Note (Signed)
[place]  [address]. [place], [address]   OPERATIVE PROCEDURE REPORT  PATIENT: Rick [name], Rick [name]  MR#: [id number] BIRTHDATE: 12-05-1928 , 65  yrs. old GENDER: Male ENDOSCOPIST: Inda Castle, MD REFERRED BY:  Elsie Stain, M.D. PROCEDURE DATE: 04/27/2012 PROCEDURE:   Small bowel enteroscopy with biopsy ASA CLASS:   Class II INDICATIONS:1.  hemoccult positive stools. MEDICATIONS: MAC sedation, administered by CRNA and propofol (Diprivan) 200mg  IV TOPICAL ANESTHETIC:  DESCRIPTION OF PROCEDURE:   After the risks benefits and alternatives of the procedure were thoroughly explained, informed consent was obtained.  The     endoscope was introduced through the mouth  and advanced to the proximal jejunum jejunum , limited by Without limitations.   The instrument was slowly withdrawn as the mucosa was fully examined.    A pedunculated polyp was found in the cardia.  Multiple biopsies were obtained and sent to pathology.   The polyp measured about 2 cm and was very friable. A hiatal hernia was found The remainder of the small bowel enteroscopy exam was otherwise normal. Retroflexed views revealed no abnormalities.    The scope was then withdrawn from the patient and the procedure terminated.  COMPLICATIONS: There were no complications. ENDOSCOPIC IMPRESSION: 1.    Bleeding, friable polyp was found in the cardia 2.   A hiatal hernia was found 3.   The remainder of the small bowel enteroscopy exam was otherwise normal  Hemoccult-positive stool is most likely related to bleeding, friable gastric polyp  RECOMMENDATIONS:  await biopsy results to determine  best method for polyp removal REPEAT EXAM:  _______________________________ eSignedInda Castle, MD 04/27/2012 4:57 PM   CC:

## 2012-04-28 ENCOUNTER — Telehealth: Payer: Self-pay

## 2012-04-28 NOTE — Telephone Encounter (Signed)
Left message

## 2012-05-04 ENCOUNTER — Encounter: Payer: Self-pay | Admitting: Gastroenterology

## 2012-05-20 ENCOUNTER — Telehealth: Payer: Self-pay | Admitting: *Deleted

## 2012-05-20 NOTE — Telephone Encounter (Signed)
Please schedule endoscopy with band ligation at [facility]

## 2012-05-21 ENCOUNTER — Telehealth: Payer: Self-pay

## 2012-05-21 NOTE — Telephone Encounter (Signed)
EGD with banding ----- Message ----- From: Maury Dus, RN Sent: 05/10/2012 2:27 PM To: Inda Castle, MD Subject: FW: Results Review Dr. [name], Does this pt need EGD with banding or EGD with APC. Just want to make sure I schedule the correct thing. Thanks, Vaughan Basta ----- Message ----- From: Laverna Peace, RN Sent: 05/10/2012 8:28 AM To: Maury Dus, RN Subject: FW: Results Review Hello. This pt needs to be scheduled for a procedure at [facility] you, Cyril Mourning ----- Message ----- From: Inda Castle, MD Sent: 05/04/2012 10:00 AM To: Oda Kilts, CMA, * Subject: Results Review    Left message for pt to call back to schedule appt.

## 2012-05-24 NOTE — Telephone Encounter (Signed)
Spoke with pts wife and they would like to schedule this after the 1st of the year.

## 2012-05-27 NOTE — Telephone Encounter (Signed)
Left another message for pt to return call to get procedure scheduled

## 2012-05-28 NOTE — Telephone Encounter (Signed)
Patients wife wants to schedule this after the first of the year

## 2012-06-05 ENCOUNTER — Other Ambulatory Visit: Payer: Self-pay | Admitting: Family Medicine

## 2012-07-12 DIAGNOSIS — Z8546 Personal history of malignant neoplasm of prostate: Secondary | ICD-10-CM | POA: Diagnosis not present

## 2012-07-21 ENCOUNTER — Other Ambulatory Visit: Payer: Self-pay | Admitting: Gastroenterology

## 2012-07-21 DIAGNOSIS — I8500 Esophageal varices without bleeding: Secondary | ICD-10-CM

## 2012-07-21 NOTE — Telephone Encounter (Signed)
Pt scheduled for EGD with banding at [facility] 07/30/12@12 :30pm. Scheduled with Sharee Pimple, case [phone]. Left message for pt to call back.

## 2012-07-30 ENCOUNTER — Ambulatory Visit (HOSPITAL_COMMUNITY): Admission: RE | Admit: 2012-07-30 | Payer: Medicare Other | Source: Ambulatory Visit | Admitting: Gastroenterology

## 2012-07-30 ENCOUNTER — Telehealth: Payer: Self-pay | Admitting: Gastroenterology

## 2012-07-30 ENCOUNTER — Encounter (HOSPITAL_COMMUNITY): Admission: RE | Payer: Self-pay | Source: Ambulatory Visit

## 2012-07-30 SURGERY — EGD (ESOPHAGOGASTRODUODENOSCOPY)
Anesthesia: Moderate Sedation

## 2012-07-30 NOTE — Telephone Encounter (Signed)
Left message for pt to call back  °

## 2012-08-02 ENCOUNTER — Telehealth: Payer: Self-pay | Admitting: Gastroenterology

## 2012-08-02 ENCOUNTER — Other Ambulatory Visit: Payer: Self-pay | Admitting: Gastroenterology

## 2012-08-02 DIAGNOSIS — I8500 Esophageal varices without bleeding: Secondary | ICD-10-CM

## 2012-08-02 NOTE — Telephone Encounter (Signed)
Spoke with pts wife and she is aware of appt date and time. Reviewed prep instructions with wife also.

## 2012-08-02 NOTE — Telephone Encounter (Signed)
See additional phone note. 

## 2012-08-02 NOTE — Telephone Encounter (Signed)
Pt missed hospital appt and wants to reschedule. Pt rescheduled at [facility] for 08/04/12. Pt to arrive at 11am for a 12:30pm appt. Pt to be NPO after midnight. Left message for pt to call back regarding appt date and time.

## 2012-08-04 ENCOUNTER — Encounter (HOSPITAL_COMMUNITY)
Admission: RE | Disposition: A | Discharge status: Home / Self Care | Payer: Self-pay | Source: Ambulatory Visit | Attending: Gastroenterology

## 2012-08-04 ENCOUNTER — Encounter (HOSPITAL_COMMUNITY): Payer: Self-pay

## 2012-08-04 ENCOUNTER — Ambulatory Visit (HOSPITAL_COMMUNITY)
Admission: RE | Admit: 2012-08-04 | Discharge: 2012-08-04 | Disposition: A | Discharge status: Home / Self Care | Payer: Medicare Other | Source: Ambulatory Visit | Attending: Gastroenterology | Admitting: Gastroenterology

## 2012-08-04 DIAGNOSIS — Z801 Family history of malignant neoplasm of trachea, bronchus and lung: Secondary | ICD-10-CM | POA: Diagnosis not present

## 2012-08-04 DIAGNOSIS — I8500 Esophageal varices without bleeding: Secondary | ICD-10-CM | POA: Diagnosis not present

## 2012-08-04 DIAGNOSIS — E785 Hyperlipidemia, unspecified: Secondary | ICD-10-CM | POA: Insufficient documentation

## 2012-08-04 DIAGNOSIS — Z800 Family history of malignant neoplasm of digestive organs: Secondary | ICD-10-CM | POA: Diagnosis not present

## 2012-08-04 DIAGNOSIS — F17200 Nicotine dependence, unspecified, uncomplicated: Secondary | ICD-10-CM | POA: Diagnosis not present

## 2012-08-04 DIAGNOSIS — K219 Gastro-esophageal reflux disease without esophagitis: Secondary | ICD-10-CM | POA: Insufficient documentation

## 2012-08-04 DIAGNOSIS — I10 Essential (primary) hypertension: Secondary | ICD-10-CM | POA: Diagnosis not present

## 2012-08-04 DIAGNOSIS — D131 Benign neoplasm of stomach: Secondary | ICD-10-CM | POA: Diagnosis not present

## 2012-08-04 DIAGNOSIS — Z808 Family history of malignant neoplasm of other organs or systems: Secondary | ICD-10-CM | POA: Diagnosis not present

## 2012-08-04 HISTORY — PX: ESOPHAGOGASTRODUODENOSCOPY: SHX5428

## 2012-08-04 HISTORY — PX: ESOPHAGEAL BANDING: SHX5518

## 2012-08-04 SURGERY — EGD (ESOPHAGOGASTRODUODENOSCOPY)
Anesthesia: Moderate Sedation

## 2012-08-04 MED ORDER — GLYCOPYRROLATE 0.2 MG/ML IJ SOLN
INTRAMUSCULAR | Status: DC | PRN
  Administered 2012-08-04: 0.2 mg via INTRAVENOUS

## 2012-08-04 MED ORDER — GLYCOPYRROLATE 0.2 MG/ML IJ SOLN
INTRAMUSCULAR | Status: AC
  Filled 2012-08-04: qty 1

## 2012-08-04 MED ORDER — DIPHENHYDRAMINE HCL 50 MG/ML IJ SOLN
INTRAMUSCULAR | Status: AC
  Filled 2012-08-04: qty 1

## 2012-08-04 MED ORDER — SODIUM CHLORIDE 0.9 % IV SOLN
INTRAVENOUS | Status: DC
  Administered 2012-08-04: 500 mL via INTRAVENOUS

## 2012-08-04 MED ORDER — MIDAZOLAM HCL 10 MG/2ML IJ SOLN
INTRAMUSCULAR | Status: AC
  Filled 2012-08-04: qty 2

## 2012-08-04 MED ORDER — FENTANYL CITRATE 0.05 MG/ML IJ SOLN
INTRAMUSCULAR | Status: DC | PRN
  Administered 2012-08-04 (×2): 25 ug via INTRAVENOUS

## 2012-08-04 MED ORDER — SODIUM CHLORIDE 0.9 % IV SOLN: INTRAVENOUS | Status: DC

## 2012-08-04 MED ORDER — BUTAMBEN-TETRACAINE-BENZOCAINE 2-2-14 % EX AERO
INHALATION_SPRAY | CUTANEOUS | Status: DC | PRN
  Administered 2012-08-04: 2 via TOPICAL

## 2012-08-04 MED ORDER — FENTANYL CITRATE 0.05 MG/ML IJ SOLN
INTRAMUSCULAR | Status: AC
  Filled 2012-08-04: qty 2

## 2012-08-04 MED ORDER — MIDAZOLAM HCL 10 MG/2ML IJ SOLN
INTRAMUSCULAR | Status: DC | PRN
  Administered 2012-08-04 (×2): 2 mg via INTRAVENOUS

## 2012-08-04 NOTE — H&P (Signed)
  History of Present Illness:Pt is scheduled for upper endoscopy and band ligation of a bleeding gastric polyp identified by prior endoscopy. He has Hemoccult-positive stools felt secondary to the bleeding polyp.    Past Medical History  Diagnosis Date  . History of prostate cancer   . GERD (gastroesophageal reflux disease)   . Hypertension   . Kidney cysts     bilateral.  Renal US  . Hyperlipidemia   . Arthritis   . History of colon polyps    Past Surgical History  Procedure Laterality Date  . Cystoscopy  02/12/99    biopsy  . Prostatectomy  02/1999  . Inguinal hernia repair  02/1999    Dr. Reece Agar  . Colonoscopy     family history includes Bone cancer in his sister; Colon cancer in his other and sister; Lung cancer in his sister; Stomach cancer in his brothers; and Stroke in his mother. Current Facility-Administered Medications  Medication Dose Route Frequency Provider Last Rate Last Dose  . 0.9 %  sodium chloride infusion   Intravenous Continuous Inda Castle, MD 20 mL/hr at 08/04/12 1244 500 mL at 08/04/12 1244  . 0.9 %  sodium chloride infusion   Intravenous Continuous Inda Castle, MD       Allergies as of 08/02/2012 - Review Complete 04/27/2012  Allergen Reaction Noted  . Lisinopril  03/22/2012    reports that he quit smoking about 10 years ago. His smoking use included Cigarettes. He smoked 0.00 packs per day. His smokeless tobacco use includes Chew. He reports that he drinks about 8.4 ounces of alcohol per week. He reports that he does not use illicit drugs.     Review of Systems: Pertinent positive and negative review of systems were noted in the above HPI section. All other review of systems were otherwise negative.  Vital signs were reviewed in today's medical record Physical Exam: General: Well developed , well nourished, no acute distress Skin: anicteric Head: Normocephalic and atraumatic Eyes:  sclerae anicteric, EOMI Ears: Normal auditory  acuity Mouth: No deformity or lesions Neck: Supple, no masses or thyromegaly Lungs: Clear throughout to auscultation Heart: Regular rate and rhythm; no murmurs, rubs or bruits Abdomen: Soft, non tender and non distended. No masses, hepatosplenomegaly or hernias noted. Normal Bowel sounds Rectal:deferred Musculoskeletal: Symmetrical with no gross deformities  Skin: No lesions on visible extremities Pulses:  Normal pulses noted Extremities: No clubbing, cyanosis, edema or deformities noted Neurological: Alert oriented x 4, grossly nonfocal Cervical Nodes:  No significant cervical adenopathy Inguinal Nodes: No significant inguinal adenopathy Psychological:  Alert and cooperative. Normal mood and affect   Impression-a bleeding gastric polyp  Plan upper endoscopy with band ligation of the polyp

## 2012-08-04 NOTE — Op Note (Signed)
[facility] [place], [address]   ENDOSCOPY PROCEDURE REPORT  PATIENT: [name], Rick  MR#: [id number] BIRTHDATE: 1929/01/27 , 54  yrs. old GENDER: Male ENDOSCOPIST: Inda Castle, MD REFERRED BY:  Elsie Stain, M.D. PROCEDURE DATE:  08/04/2012 PROCEDURE:  EGD w/ band ligation of varices ASA CLASS:     Class II INDICATIONS:  Bleeding gastric polyp. MEDICATIONS: These medications were titrated to patient response per physician's verbal order, Fentanyl 50 mcg IV, Versed 4 mg IV, and Robinul 0.2 mg IV TOPICAL ANESTHETIC: Cetacaine Spray  DESCRIPTION OF PROCEDURE: After the risks benefits and alternatives of the procedure were thoroughly explained, informed consent was obtained.  The Pentax Gastroscope [id number] endoscope was introduced through the mouth and advanced to the bulb of duodenum. Without limitations.  The instrument was slowly withdrawn as the mucosa was fully examined.      Again noted was a friable, definite polyp in the gastric body measuring approximately 2 cm.  3 endoscopic bands were placed at the and of the pedunculated polyp in the area of the stalk.   The remainder of the upper endoscopy exam was otherwise normal.  Retroflexed views revealed no abnormalities.     The scope was then withdrawn from the patient and the procedure completed.  COMPLICATIONS: There were no complications.  ENDOSCOPIC IMPRESSION: Bleeding gastric polyp-status post band ligation  RECOMMENDATIONS: Followup Hemoccults in one month  REPEAT EXAM:  eSigned:  Inda Castle, MD 08/04/2012 1:18 PM   CC:

## 2012-08-05 ENCOUNTER — Encounter (HOSPITAL_COMMUNITY): Payer: Self-pay | Admitting: Gastroenterology

## 2012-08-10 ENCOUNTER — Other Ambulatory Visit: Payer: Self-pay | Admitting: Gastroenterology

## 2012-08-10 DIAGNOSIS — K921 Melena: Secondary | ICD-10-CM

## 2012-09-13 ENCOUNTER — Other Ambulatory Visit (INDEPENDENT_AMBULATORY_CARE_PROVIDER_SITE_OTHER): Payer: Medicare Other

## 2012-09-13 DIAGNOSIS — K921 Melena: Secondary | ICD-10-CM

## 2012-09-13 LAB — HEMOCCULT SLIDES (X 3 CARDS)
OCCULT 1: NEGATIVE
OCCULT 2: NEGATIVE
OCCULT 4: NEGATIVE

## 2012-09-18 NOTE — Progress Notes (Signed)
Quick Note:  Please inform the patient that lab work were normal. No furthernGI workup. Cc referring MD  ______

## 2012-09-20 ENCOUNTER — Telehealth: Payer: Self-pay | Admitting: *Deleted

## 2012-09-20 NOTE — Telephone Encounter (Signed)
Results sent via EPIC to Dr. [name]. Left a message for patient to call me.

## 2012-09-20 NOTE — Telephone Encounter (Signed)
Message copied by Hulan Saas on Mon Sep 20, 2012  8:59 AM ------      Message from: Erskine Emery D      Created: Sat Sep 18, 2012  7:49 PM       Please inform the patient that lab work were normal.  No furthernGI workup.  Cc referring MD       ------

## 2012-09-21 NOTE — Telephone Encounter (Signed)
Spoke with patient and gave him results

## 2012-09-21 NOTE — Telephone Encounter (Signed)
Left message for patient to call me

## 2012-10-13 DIAGNOSIS — H0230 Blepharochalasis unspecified eye, unspecified eyelid: Secondary | ICD-10-CM | POA: Diagnosis not present

## 2012-10-13 DIAGNOSIS — H40029 Open angle with borderline findings, high risk, unspecified eye: Secondary | ICD-10-CM | POA: Diagnosis not present

## 2012-10-13 DIAGNOSIS — H11009 Unspecified pterygium of unspecified eye: Secondary | ICD-10-CM | POA: Diagnosis not present

## 2012-10-13 DIAGNOSIS — Z961 Presence of intraocular lens: Secondary | ICD-10-CM | POA: Diagnosis not present

## 2012-10-14 DIAGNOSIS — H35369 Drusen (degenerative) of macula, unspecified eye: Secondary | ICD-10-CM | POA: Diagnosis not present

## 2012-10-14 DIAGNOSIS — H3560 Retinal hemorrhage, unspecified eye: Secondary | ICD-10-CM | POA: Diagnosis not present

## 2012-10-14 DIAGNOSIS — H43829 Vitreomacular adhesion, unspecified eye: Secondary | ICD-10-CM | POA: Diagnosis not present

## 2012-10-14 DIAGNOSIS — H3509 Other intraretinal microvascular abnormalities: Secondary | ICD-10-CM | POA: Diagnosis not present

## 2012-12-16 DIAGNOSIS — H35369 Drusen (degenerative) of macula, unspecified eye: Secondary | ICD-10-CM | POA: Diagnosis not present

## 2012-12-16 DIAGNOSIS — H43819 Vitreous degeneration, unspecified eye: Secondary | ICD-10-CM | POA: Diagnosis not present

## 2012-12-16 DIAGNOSIS — H353190 Nonexudative age-related macular degeneration, unspecified eye, stage unspecified: Secondary | ICD-10-CM | POA: Diagnosis not present

## 2012-12-16 DIAGNOSIS — H3560 Retinal hemorrhage, unspecified eye: Secondary | ICD-10-CM | POA: Diagnosis not present

## 2013-02-05 ENCOUNTER — Other Ambulatory Visit: Payer: Self-pay | Admitting: Family Medicine

## 2013-02-12 ENCOUNTER — Other Ambulatory Visit: Payer: Self-pay | Admitting: Family Medicine

## 2013-02-14 ENCOUNTER — Other Ambulatory Visit: Payer: Self-pay | Admitting: Family Medicine

## 2013-03-18 ENCOUNTER — Other Ambulatory Visit: Payer: Self-pay | Admitting: *Deleted

## 2013-03-18 ENCOUNTER — Other Ambulatory Visit: Payer: Self-pay | Admitting: Family Medicine

## 2013-04-13 ENCOUNTER — Other Ambulatory Visit: Payer: Self-pay | Admitting: Family Medicine

## 2013-04-13 NOTE — Telephone Encounter (Signed)
Ok to fill? Last seen 03/2012, no future appts scheduled

## 2013-04-13 NOTE — Telephone Encounter (Signed)
Sent, schedule a CPE.  Thanks.

## 2013-04-14 ENCOUNTER — Other Ambulatory Visit: Payer: Self-pay | Admitting: Family Medicine

## 2013-04-14 NOTE — Telephone Encounter (Signed)
Electronic refill request, no recent appt (over a year ago), and no future appt, please advise

## 2013-04-14 NOTE — Telephone Encounter (Signed)
Sent, please see if you can get him scheduled for a BP check and labs at [facility].  Thanks.

## 2013-04-15 NOTE — Telephone Encounter (Signed)
Appointment scheduled.

## 2013-04-19 ENCOUNTER — Ambulatory Visit (INDEPENDENT_AMBULATORY_CARE_PROVIDER_SITE_OTHER): Payer: Medicare Other | Admitting: Family Medicine

## 2013-04-19 ENCOUNTER — Encounter: Payer: Self-pay | Admitting: Family Medicine

## 2013-04-19 VITALS — BP 142/80 | HR 63 | Temp 97.8°F | Ht 68.0 in | Wt 171.5 lb

## 2013-04-19 DIAGNOSIS — I10 Essential (primary) hypertension: Secondary | ICD-10-CM | POA: Diagnosis not present

## 2013-04-19 DIAGNOSIS — E7800 Pure hypercholesterolemia, unspecified: Secondary | ICD-10-CM

## 2013-04-19 DIAGNOSIS — Z23 Encounter for immunization: Secondary | ICD-10-CM

## 2013-04-19 LAB — COMPREHENSIVE METABOLIC PANEL
AST: 21 U/L (ref 0–37)
Albumin: 3.7 g/dL (ref 3.5–5.2)
BUN: 10 mg/dL (ref 6–23)
CO2: 29 mEq/L (ref 19–32)
Calcium: 9 mg/dL (ref 8.4–10.5)
Chloride: 102 mEq/L (ref 96–112)
Creatinine, Ser: 1.4 mg/dL (ref 0.4–1.5)
GFR: 62.9 mL/min (ref >60.00)
Glucose, Bld: 103 mg/dL — ABNORMAL HIGH (ref 70–99)

## 2013-04-19 LAB — LIPID PANEL
Cholesterol: 180 mg/dL (ref 0–200)
HDL: 55.4 mg/dL (ref >39.00)
Total CHOL/HDL Ratio: 3
Triglycerides: 81 mg/dL (ref 0.0–149.0)

## 2013-04-19 MED ORDER — FUROSEMIDE 20 MG PO TABS: ORAL_TABLET | ORAL | Status: DC

## 2013-04-19 MED ORDER — POTASSIUM CHLORIDE CRYS ER 10 MEQ PO TBCR: EXTENDED_RELEASE_TABLET | ORAL | Status: DC

## 2013-04-19 MED ORDER — LOSARTAN POTASSIUM 25 MG PO TABS: ORAL_TABLET | ORAL | Status: DC

## 2013-04-19 MED ORDER — AMLODIPINE-ATORVASTATIN 10-10 MG PO TABS: ORAL_TABLET | ORAL | Status: DC

## 2013-04-19 MED ORDER — METOPROLOL SUCCINATE ER 50 MG PO TB24: ORAL_TABLET | ORAL | Status: DC

## 2013-04-19 NOTE — Patient Instructions (Signed)
Go to the lab on the way out.  We'll contact you with your lab report.  Take care.  Keep walking for exercise.  Glad to see you.  Don't change your meds.  Call with questions.   I would schedule a physical for next summer or fall.

## 2013-04-19 NOTE — Assessment & Plan Note (Signed)
Continue current meds for now.  See notes on labs.  Reasonable control, minimal edema.   Continue healthy diet and walking for exercise.

## 2013-04-19 NOTE — Progress Notes (Signed)
Pre-visit discussion using our clinic review tool. No additional management support is needed unless otherwise documented below in the visit note.  Hypertension:    Using medication without problems or lightheadedness: yes Chest pain with exertion:no Edema:occ, rare, resolves on its own Short of breath:no Due for labs.   Elevated Cholesterol: Using medications without problems: yes Muscle aches: no Diet compliance:yes Exercise:walking  Meds, vitals, and allergies reviewed.   ROS: See HPI.  Otherwise negative.    GEN: nad, alert and oriented HEENT: mucous membranes moist NECK: supple w/o LA CV: rrr. PULM: ctab, no inc wob ABD: soft, +bs EXT: trace edema SKIN: no acute rash

## 2013-04-19 NOTE — Addendum Note (Signed)
Addended by: Josetta Huddle on: 04/19/2013 11:13 AM   Modules accepted: Orders

## 2013-04-19 NOTE — Assessment & Plan Note (Signed)
Continue current meds for now.  See notes on labs.

## 2013-05-17 DIAGNOSIS — H353190 Nonexudative age-related macular degeneration, unspecified eye, stage unspecified: Secondary | ICD-10-CM | POA: Diagnosis not present

## 2013-05-17 DIAGNOSIS — Z961 Presence of intraocular lens: Secondary | ICD-10-CM | POA: Diagnosis not present

## 2013-05-17 DIAGNOSIS — H40019 Open angle with borderline findings, low risk, unspecified eye: Secondary | ICD-10-CM | POA: Diagnosis not present

## 2013-05-17 DIAGNOSIS — H11009 Unspecified pterygium of unspecified eye: Secondary | ICD-10-CM | POA: Diagnosis not present

## 2013-05-17 DIAGNOSIS — H3560 Retinal hemorrhage, unspecified eye: Secondary | ICD-10-CM | POA: Diagnosis not present

## 2013-05-17 DIAGNOSIS — H0230 Blepharochalasis unspecified eye, unspecified eyelid: Secondary | ICD-10-CM | POA: Diagnosis not present

## 2013-05-26 ENCOUNTER — Encounter: Payer: Self-pay | Admitting: Family Medicine

## 2013-06-16 DIAGNOSIS — H3509 Other intraretinal microvascular abnormalities: Secondary | ICD-10-CM | POA: Diagnosis not present

## 2013-06-16 DIAGNOSIS — H353190 Nonexudative age-related macular degeneration, unspecified eye, stage unspecified: Secondary | ICD-10-CM | POA: Diagnosis not present

## 2013-07-11 DIAGNOSIS — N393 Stress incontinence (female) (male): Secondary | ICD-10-CM | POA: Diagnosis not present

## 2013-07-11 DIAGNOSIS — Z8546 Personal history of malignant neoplasm of prostate: Secondary | ICD-10-CM | POA: Diagnosis not present

## 2013-07-11 DIAGNOSIS — N182 Chronic kidney disease, stage 2 (mild): Secondary | ICD-10-CM | POA: Diagnosis not present

## 2013-07-17 ENCOUNTER — Other Ambulatory Visit: Payer: Self-pay | Admitting: Family Medicine

## 2013-08-13 ENCOUNTER — Other Ambulatory Visit: Payer: Self-pay | Admitting: Family Medicine

## 2013-08-19 ENCOUNTER — Other Ambulatory Visit: Payer: Self-pay | Admitting: Family Medicine

## 2013-08-26 ENCOUNTER — Other Ambulatory Visit: Payer: Self-pay | Admitting: Family Medicine

## 2013-09-29 ENCOUNTER — Other Ambulatory Visit: Payer: Self-pay | Admitting: Family Medicine

## 2013-09-29 ENCOUNTER — Other Ambulatory Visit: Payer: Self-pay | Admitting: *Deleted

## 2013-09-29 MED ORDER — METOPROLOL SUCCINATE ER 50 MG PO TB24: ORAL_TABLET | ORAL | Status: DC

## 2013-09-29 MED ORDER — FUROSEMIDE 20 MG PO TABS: ORAL_TABLET | ORAL | Status: DC

## 2013-09-29 MED ORDER — POTASSIUM CHLORIDE CRYS ER 10 MEQ PO TBCR: EXTENDED_RELEASE_TABLET | ORAL | Status: DC

## 2013-09-29 MED ORDER — LOSARTAN POTASSIUM 25 MG PO TABS: ORAL_TABLET | ORAL | Status: DC

## 2013-11-24 DIAGNOSIS — H40019 Open angle with borderline findings, low risk, unspecified eye: Secondary | ICD-10-CM | POA: Diagnosis not present

## 2013-11-24 DIAGNOSIS — Z961 Presence of intraocular lens: Secondary | ICD-10-CM | POA: Diagnosis not present

## 2013-11-24 DIAGNOSIS — H11009 Unspecified pterygium of unspecified eye: Secondary | ICD-10-CM | POA: Diagnosis not present

## 2013-12-16 ENCOUNTER — Encounter: Payer: Self-pay | Admitting: Gastroenterology

## 2014-01-30 ENCOUNTER — Encounter: Payer: Self-pay | Admitting: Family Medicine

## 2014-01-30 ENCOUNTER — Encounter (INDEPENDENT_AMBULATORY_CARE_PROVIDER_SITE_OTHER): Payer: Self-pay

## 2014-01-30 ENCOUNTER — Ambulatory Visit (INDEPENDENT_AMBULATORY_CARE_PROVIDER_SITE_OTHER)
Admission: RE | Admit: 2014-01-30 | Discharge: 2014-01-30 | Disposition: A | Discharge status: Home / Self Care | Payer: Medicare Other | Source: Ambulatory Visit | Attending: Family Medicine | Admitting: Family Medicine

## 2014-01-30 ENCOUNTER — Ambulatory Visit (INDEPENDENT_AMBULATORY_CARE_PROVIDER_SITE_OTHER): Payer: Medicare Other | Admitting: Family Medicine

## 2014-01-30 VITALS — BP 118/70 | HR 80 | Temp 97.3°F | Wt 164.2 lb

## 2014-01-30 DIAGNOSIS — R143 Flatulence: Secondary | ICD-10-CM

## 2014-01-30 DIAGNOSIS — R141 Gas pain: Secondary | ICD-10-CM

## 2014-01-30 DIAGNOSIS — R142 Eructation: Secondary | ICD-10-CM

## 2014-01-30 DIAGNOSIS — R109 Unspecified abdominal pain: Secondary | ICD-10-CM | POA: Diagnosis not present

## 2014-01-30 DIAGNOSIS — R140 Abdominal distension (gaseous): Secondary | ICD-10-CM

## 2014-01-30 LAB — COMPREHENSIVE METABOLIC PANEL
ALT: 60 U/L — ABNORMAL HIGH (ref 0–53)
AST: 74 U/L — ABNORMAL HIGH (ref 0–37)
Albumin: 2.8 g/dL — ABNORMAL LOW (ref 3.5–5.2)
Alkaline Phosphatase: 120 U/L — ABNORMAL HIGH (ref 39–117)
BUN: 19 mg/dL (ref 6–23)
CO2: 26 meq/L (ref 19–32)
CREATININE: 2.2 mg/dL — AB (ref 0.4–1.5)
Calcium: 9 mg/dL (ref 8.4–10.5)
Chloride: 99 mEq/L (ref 96–112)
GFR: 37.75 mL/min — AB (ref >60.00)
GLUCOSE: 91 mg/dL (ref 70–99)
Potassium: 3.7 mEq/L (ref 3.5–5.1)
SODIUM: 135 meq/L (ref 135–145)
TOTAL PROTEIN: 8 g/dL (ref 6.0–8.3)
Total Bilirubin: 0.4 mg/dL (ref 0.2–1.2)

## 2014-01-30 LAB — CBC WITH DIFFERENTIAL/PLATELET
BASOS PCT: 0.1 % (ref 0.0–3.0)
Basophils Absolute: 0 10*3/uL (ref 0.0–0.1)
Eosinophils Absolute: 0.1 10*3/uL (ref 0.0–0.7)
Eosinophils Relative: 0.8 % (ref 0.0–5.0)
HCT: 31.4 % — ABNORMAL LOW (ref 39.0–52.0)
Hemoglobin: 10.1 g/dL — ABNORMAL LOW (ref 13.0–17.0)
Lymphocytes Relative: 6.4 % — ABNORMAL LOW (ref 12.0–46.0)
Lymphs Abs: 1 10*3/uL (ref 0.7–4.0)
MCHC: 32.3 g/dL (ref 30.0–36.0)
MCV: 80.3 fl (ref 78.0–100.0)
MONOS PCT: 7.7 % (ref 3.0–12.0)
Monocytes Absolute: 1.2 10*3/uL — ABNORMAL HIGH (ref 0.1–1.0)
Neutro Abs: 13.6 10*3/uL — ABNORMAL HIGH (ref 1.4–7.7)
PLATELETS: 304 10*3/uL (ref 150.0–400.0)
RBC: 3.91 Mil/uL — AB (ref 4.22–5.81)
RDW: 16.7 % — ABNORMAL HIGH (ref 11.5–15.5)
WBC: 16 10*3/uL — ABNORMAL HIGH (ref 4.0–10.5)

## 2014-01-30 NOTE — Patient Instructions (Signed)
Take mirlax daily for now and go to the lab on the way out.  We'll contact you with your lab report. We'll go from there. Take care.  Glad to see you.

## 2014-01-30 NOTE — Progress Notes (Signed)
Pre visit review using our clinic review tool, if applicable. No additional management support is needed unless otherwise documented below in the visit note.  Sx noted sx in mid July 2015.  He had some chills, felt cold initially.  Then had abd pain.  He had gradual dec in appetite. The abd pain resolved but the appetite changes persisted longer.  Recently with some mild inc in PO intake.  No vomiting at any point.  Some nausea previously.  No diarrhea.  He tired stool softeners and OTC laxatives with some partial improvement.  Still with BMs, normal.  No blood in stool.  Daily BM.  Last took a laxative last night.  Still with some nausea, better than prev.  No burning with urination.  No fevers but some sweats.    Abd bloating is better now than prev.  Overall he feels better today.   No travel, no new foods. No other sick contacts.    Meds, vitals, and allergies reviewed.   ROS: See HPI.  Otherwise, noncontributory.  nad ncat Mmm rrr ctab abd bloated but not ttp, normal BS

## 2014-01-31 ENCOUNTER — Ambulatory Visit
Admission: RE | Admit: 2014-01-31 | Discharge: 2014-01-31 | Disposition: A | Discharge status: Home / Self Care | Payer: 59 | Source: Ambulatory Visit | Attending: Family Medicine | Admitting: Family Medicine

## 2014-01-31 ENCOUNTER — Telehealth: Payer: Self-pay

## 2014-01-31 DIAGNOSIS — N281 Cyst of kidney, acquired: Secondary | ICD-10-CM | POA: Insufficient documentation

## 2014-01-31 DIAGNOSIS — R140 Abdominal distension (gaseous): Secondary | ICD-10-CM

## 2014-01-31 DIAGNOSIS — K8020 Calculus of gallbladder without cholecystitis without obstruction: Secondary | ICD-10-CM | POA: Diagnosis not present

## 2014-01-31 NOTE — Assessment & Plan Note (Signed)
See notes on labs.   I initially thought this was an issue with stool retention/constipation, but wasn't demonstrated on KUB.  I assume his relative dec in PO intake led to the inc in Cr.  Will check u/s given the LFT changes.  I don't want him to have CT abd with contrast given the Cr.  Will get u/s first and then have recheck here in clinic.  He doesn't appear toxic at all, only with the distension that isn't sore.  Okay for outpatient f/u.  >25 minutes spent in face to face time with patient, >50% spent in counselling or coordination of care.

## 2014-01-31 NOTE — Telephone Encounter (Signed)
Patient not available.  Left message with wife (patient had asked me to speak to his wife also on Monday).  Wife says he will come to the appt with Dr. [name] on Wed, Aug 26th.

## 2014-01-31 NOTE — Telephone Encounter (Signed)
Please call pt.  Right complex mass measuring 23x13x16cm, unclear if mass is related to kidney or the liver.  Needs CT abd/pelvis with contrast.  We'll need to get his labs done again tomorrow and then proceed with the scan.  I wouldn't get the scan w/o rechecking him and his labs first (at the [facility] tomorrow).  Continue as planned in the meantime, with the med changes listed yesterday.  Thanks.

## 2014-01-31 NOTE — Telephone Encounter (Signed)
[facility] called report for [place] of abdomen; pt not waiting; report in pt's chart under imaging. Copy of report given to Dr Danise Mina and in Dr Josefine Class in box for review since Dr [name] is not in office until this afternoon.

## 2014-01-31 NOTE — Telephone Encounter (Signed)
Received call report on abd US showing R complex mass measuring 23x13.16cm, unclear if suprarenal mass or inferior projection from liver - rec CT abd/pelvis with contrast  Also showing mult gallstones, kidney cyst x1 possibly complex on right and several on left as well as kidney stones x3 on left  Reviewed yesterday's office note. Pt has appt with PCP tomorrow, PCP will be in office this afternoon.  Will route to him for f/u. Pt has not been contacted yet.

## 2014-02-01 ENCOUNTER — Encounter: Payer: Self-pay | Admitting: Family Medicine

## 2014-02-01 ENCOUNTER — Ambulatory Visit (INDEPENDENT_AMBULATORY_CARE_PROVIDER_SITE_OTHER): Payer: Medicare Other | Admitting: Family Medicine

## 2014-02-01 ENCOUNTER — Telehealth: Payer: Self-pay | Admitting: Radiology

## 2014-02-01 VITALS — BP 120/70 | HR 76 | Temp 98.1°F | Wt 166.2 lb

## 2014-02-01 DIAGNOSIS — R1900 Intra-abdominal and pelvic swelling, mass and lump, unspecified site: Secondary | ICD-10-CM | POA: Diagnosis not present

## 2014-02-01 LAB — CBC WITH DIFFERENTIAL/PLATELET
BASOS ABS: 0 10*3/uL (ref 0.0–0.1)
Basophils Relative: 0.2 % (ref 0.0–3.0)
Eosinophils Absolute: 0.2 10*3/uL (ref 0.0–0.7)
Eosinophils Relative: 1.2 % (ref 0.0–5.0)
HCT: 30 % — ABNORMAL LOW (ref 39.0–52.0)
Hemoglobin: 9.8 g/dL — ABNORMAL LOW (ref 13.0–17.0)
LYMPHS PCT: 6.4 % — AB (ref 12.0–46.0)
Lymphs Abs: 1.2 10*3/uL (ref 0.7–4.0)
MCHC: 32 g/dL (ref 30.0–36.0)
MCV: 80.4 fl (ref 78.0–100.0)
MONOS PCT: 6.1 % (ref 3.0–12.0)
Monocytes Absolute: 1.1 10*3/uL — ABNORMAL HIGH (ref 0.1–1.0)
NEUTROS PCT: 86.1 % — AB (ref 43.0–77.0)
Neutro Abs: 16.1 10*3/uL — ABNORMAL HIGH (ref 1.4–7.7)
PLATELETS: 383 10*3/uL (ref 150.0–400.0)
RBC: 3.74 Mil/uL — ABNORMAL LOW (ref 4.22–5.81)
RDW: 17.1 % — AB (ref 11.5–15.5)
WBC: 18.1 10*3/uL (ref 4.0–10.5)

## 2014-02-01 LAB — COMPREHENSIVE METABOLIC PANEL
ALBUMIN: 2.7 g/dL — AB (ref 3.5–5.2)
ALT: 40 U/L (ref 0–53)
AST: 33 U/L (ref 0–37)
Alkaline Phosphatase: 111 U/L (ref 39–117)
BUN: 16 mg/dL (ref 6–23)
CO2: 24 meq/L (ref 19–32)
Calcium: 8.6 mg/dL (ref 8.4–10.5)
Chloride: 99 mEq/L (ref 96–112)
Creatinine, Ser: 2 mg/dL — ABNORMAL HIGH (ref 0.4–1.5)
GFR: 40.55 mL/min — AB (ref >60.00)
GLUCOSE: 108 mg/dL — AB (ref 70–99)
POTASSIUM: 4 meq/L (ref 3.5–5.1)
SODIUM: 133 meq/L — AB (ref 135–145)
TOTAL PROTEIN: 7.8 g/dL (ref 6.0–8.3)
Total Bilirubin: 0.6 mg/dL (ref 0.2–1.2)

## 2014-02-01 NOTE — Patient Instructions (Signed)
Go to the lab on the way out.  We'll contact you with your lab report. Stay off the potassium, losartan and furosemide (also known as lasix). We'll likely get the CT scan set up soon.   Take care.  If you have a fever or a lot of abdominal pain, then go to the ER.

## 2014-02-01 NOTE — Telephone Encounter (Signed)
[facility] called a critical result, WBC 18.1. Results given to Dr [name]

## 2014-02-01 NOTE — Assessment & Plan Note (Signed)
Labs reviewed.  Labs are similar to prev, kidney function not worse but not much better. Will not change his meds for now.  Needs CT scan, but we can't do this with IV contrast- I d/w rady MD and we should use IV contast. We'll need to do CT with oral contrast only. Order is in.  We'll go from there. I don't expect him to worsen overnight, but if he does then go to ER. >25 minutes spent in face to face time with patient, >50% spent in counselling or coordination of care.

## 2014-02-01 NOTE — Telephone Encounter (Signed)
Similar to prev, awaiting other labs re: cr for CT scan planning. Clinically improved.

## 2014-02-01 NOTE — Progress Notes (Signed)
Pre visit review using our clinic review tool, if applicable. No additional management support is needed unless otherwise documented below in the visit note.  F/u for abd mass, elevated cr.  He stopped lasix, K and ARB.  In meantime, drinking more fluids.  No abd pain, BMs returning to normal.  No fevers, no sweats.  No complaints other than continued abd bloating.  No edema, no sob.    U/s results d/w pt re: mass and anatomy, with possible liver vs renal source for the mass.    Meds, vitals, and allergies reviewed.   ROS: See HPI.  Otherwise, noncontributory.  nad ncat Mmm Neck supple, no LA rrr ctab Abd soft, distended but not ttp, normal BS Ext w/o edema.   Repeat labs pending.

## 2014-02-02 ENCOUNTER — Ambulatory Visit: Payer: Medicare Other

## 2014-02-02 ENCOUNTER — Other Ambulatory Visit: Payer: Self-pay | Admitting: Family Medicine

## 2014-02-02 ENCOUNTER — Telehealth: Payer: Self-pay

## 2014-02-02 ENCOUNTER — Encounter: Payer: Self-pay | Admitting: Family Medicine

## 2014-02-02 ENCOUNTER — Ambulatory Visit (INDEPENDENT_AMBULATORY_CARE_PROVIDER_SITE_OTHER)
Admission: RE | Admit: 2014-02-02 | Discharge: 2014-02-02 | Disposition: A | Discharge status: Home / Self Care | Payer: Medicare Other | Source: Ambulatory Visit | Attending: Family Medicine | Admitting: Family Medicine

## 2014-02-02 DIAGNOSIS — R1900 Intra-abdominal and pelvic swelling, mass and lump, unspecified site: Secondary | ICD-10-CM | POA: Diagnosis not present

## 2014-02-02 DIAGNOSIS — Z125 Encounter for screening for malignant neoplasm of prostate: Secondary | ICD-10-CM

## 2014-02-02 DIAGNOSIS — Z8546 Personal history of malignant neoplasm of prostate: Secondary | ICD-10-CM | POA: Insufficient documentation

## 2014-02-02 DIAGNOSIS — N200 Calculus of kidney: Secondary | ICD-10-CM | POA: Diagnosis not present

## 2014-02-02 DIAGNOSIS — N281 Cyst of kidney, acquired: Secondary | ICD-10-CM

## 2014-02-02 DIAGNOSIS — K8020 Calculus of gallbladder without cholecystitis without obstruction: Secondary | ICD-10-CM | POA: Diagnosis not present

## 2014-02-02 LAB — PSA: PSA: 0.05 ng/mL — ABNORMAL LOW (ref 0.10–4.00)

## 2014-02-02 NOTE — Telephone Encounter (Signed)
Rick [name] request cb about CT of abdomen report; Rick [name] is not home but will be home by 4:00 - 4:15.

## 2014-02-06 ENCOUNTER — Other Ambulatory Visit (INDEPENDENT_AMBULATORY_CARE_PROVIDER_SITE_OTHER): Payer: Medicare Other

## 2014-02-06 ENCOUNTER — Telehealth: Payer: Self-pay | Admitting: Family Medicine

## 2014-02-06 DIAGNOSIS — N281 Cyst of kidney, acquired: Secondary | ICD-10-CM

## 2014-02-06 DIAGNOSIS — Q619 Cystic kidney disease, unspecified: Secondary | ICD-10-CM | POA: Diagnosis not present

## 2014-02-06 LAB — BASIC METABOLIC PANEL
BUN: 12 mg/dL (ref 6–23)
CHLORIDE: 102 meq/L (ref 96–112)
CO2: 24 mEq/L (ref 19–32)
Calcium: 8.8 mg/dL (ref 8.4–10.5)
Creatinine, Ser: 1.7 mg/dL — ABNORMAL HIGH (ref 0.4–1.5)
GFR: 49.09 mL/min — ABNORMAL LOW (ref >60.00)
Glucose, Bld: 113 mg/dL — ABNORMAL HIGH (ref 70–99)
POTASSIUM: 5 meq/L (ref 3.5–5.1)
SODIUM: 136 meq/L (ref 135–145)

## 2014-02-06 NOTE — Telephone Encounter (Signed)
Labwork faxed to Urology Dr Louis Meckel for appt tomorrow, 02/07/14 at 8am, Basic Metabolic panel.

## 2014-02-06 NOTE — Telephone Encounter (Signed)
Please see lab result note- I don't know if the most recent labs already went over.  Thanks.

## 2014-02-07 DIAGNOSIS — Q613 Polycystic kidney, unspecified: Secondary | ICD-10-CM | POA: Diagnosis not present

## 2014-02-07 DIAGNOSIS — R82998 Other abnormal findings in urine: Secondary | ICD-10-CM | POA: Diagnosis not present

## 2014-02-07 DIAGNOSIS — R599 Enlarged lymph nodes, unspecified: Secondary | ICD-10-CM | POA: Diagnosis not present

## 2014-02-07 DIAGNOSIS — Z8546 Personal history of malignant neoplasm of prostate: Secondary | ICD-10-CM | POA: Diagnosis not present

## 2014-02-07 NOTE — Telephone Encounter (Signed)
[facility] faxed labs.

## 2014-03-06 ENCOUNTER — Encounter: Payer: Self-pay | Admitting: Gastroenterology

## 2014-04-20 ENCOUNTER — Other Ambulatory Visit: Payer: Self-pay | Admitting: Family Medicine

## 2014-04-20 NOTE — Telephone Encounter (Signed)
Sent. Thanks.   

## 2014-04-20 NOTE — Telephone Encounter (Signed)
Received refill request electronically from pharmacy. See warning with Amlodipine and atorvastatin. Is it okay to refill medication?

## 2014-05-11 DIAGNOSIS — Z8546 Personal history of malignant neoplasm of prostate: Secondary | ICD-10-CM | POA: Diagnosis not present

## 2014-05-18 DIAGNOSIS — R590 Localized enlarged lymph nodes: Secondary | ICD-10-CM | POA: Diagnosis not present

## 2014-05-18 DIAGNOSIS — Z8546 Personal history of malignant neoplasm of prostate: Secondary | ICD-10-CM | POA: Diagnosis not present

## 2014-05-18 DIAGNOSIS — Q613 Polycystic kidney, unspecified: Secondary | ICD-10-CM | POA: Diagnosis not present

## 2014-05-18 DIAGNOSIS — N390 Urinary tract infection, site not specified: Secondary | ICD-10-CM | POA: Diagnosis not present

## 2014-05-25 DIAGNOSIS — H4011X1 Primary open-angle glaucoma, mild stage: Secondary | ICD-10-CM | POA: Diagnosis not present

## 2014-05-25 DIAGNOSIS — E119 Type 2 diabetes mellitus without complications: Secondary | ICD-10-CM | POA: Diagnosis not present

## 2014-05-25 DIAGNOSIS — Z961 Presence of intraocular lens: Secondary | ICD-10-CM | POA: Diagnosis not present

## 2014-05-25 LAB — HM DIABETES EYE EXAM

## 2014-05-30 ENCOUNTER — Encounter: Payer: Self-pay | Admitting: Family Medicine

## 2014-06-05 ENCOUNTER — Encounter: Payer: Self-pay | Admitting: Family Medicine

## 2014-07-27 DIAGNOSIS — H40013 Open angle with borderline findings, low risk, bilateral: Secondary | ICD-10-CM | POA: Diagnosis not present

## 2014-08-07 ENCOUNTER — Other Ambulatory Visit: Payer: Self-pay | Admitting: Family Medicine

## 2014-08-07 NOTE — Telephone Encounter (Signed)
Sent, schedule CPE for this summer.  Thanks.

## 2014-08-07 NOTE — Telephone Encounter (Signed)
Electronic refill request. Patient has refills remaining but is requesting 90 day supply but hasn't been seen for HTN since 2014.  Please advise. Last Filled:    30 tablet 5 04/20/2014

## 2014-08-08 NOTE — Telephone Encounter (Signed)
Sent to front desk pool for CPE scheduling.

## 2014-08-10 ENCOUNTER — Telehealth: Payer: Self-pay | Admitting: Family Medicine

## 2014-08-10 NOTE — Telephone Encounter (Signed)
Left message asking pt to call office    schedule CPE for this summer.[name]

## 2014-08-11 NOTE — Telephone Encounter (Signed)
Left message asking pt to call office  °

## 2014-08-11 NOTE — Telephone Encounter (Signed)
V/M left returning call and request cb at [phone].

## 2014-08-16 NOTE — Telephone Encounter (Signed)
Left message asking pt to call office  °

## 2014-08-18 DIAGNOSIS — Q613 Polycystic kidney, unspecified: Secondary | ICD-10-CM | POA: Diagnosis not present

## 2014-08-18 DIAGNOSIS — R590 Localized enlarged lymph nodes: Secondary | ICD-10-CM | POA: Diagnosis not present

## 2014-08-18 DIAGNOSIS — N200 Calculus of kidney: Secondary | ICD-10-CM | POA: Diagnosis not present

## 2014-08-18 DIAGNOSIS — K8020 Calculus of gallbladder without cholecystitis without obstruction: Secondary | ICD-10-CM | POA: Diagnosis not present

## 2014-08-18 DIAGNOSIS — N281 Cyst of kidney, acquired: Secondary | ICD-10-CM | POA: Diagnosis not present

## 2014-08-21 DIAGNOSIS — Z8546 Personal history of malignant neoplasm of prostate: Secondary | ICD-10-CM | POA: Diagnosis not present

## 2014-08-21 DIAGNOSIS — R590 Localized enlarged lymph nodes: Secondary | ICD-10-CM | POA: Diagnosis not present

## 2014-08-23 ENCOUNTER — Encounter: Payer: Self-pay | Admitting: Family Medicine

## 2014-08-23 NOTE — Telephone Encounter (Signed)
Mailed letter °

## 2014-08-23 NOTE — Telephone Encounter (Signed)
Left message asking pt to call office  °

## 2014-08-25 ENCOUNTER — Other Ambulatory Visit: Payer: Self-pay | Admitting: Family Medicine

## 2014-10-25 DIAGNOSIS — H4011X2 Primary open-angle glaucoma, moderate stage: Secondary | ICD-10-CM | POA: Diagnosis not present

## 2014-10-25 DIAGNOSIS — Z961 Presence of intraocular lens: Secondary | ICD-10-CM | POA: Diagnosis not present

## 2014-11-24 ENCOUNTER — Other Ambulatory Visit: Payer: Self-pay | Admitting: Family Medicine

## 2014-11-25 ENCOUNTER — Other Ambulatory Visit: Payer: Self-pay | Admitting: Family Medicine

## 2015-01-11 ENCOUNTER — Emergency Department (HOSPITAL_COMMUNITY): Payer: Medicare Other

## 2015-01-11 ENCOUNTER — Encounter (HOSPITAL_COMMUNITY): Payer: Self-pay | Admitting: Emergency Medicine

## 2015-01-11 ENCOUNTER — Emergency Department (HOSPITAL_COMMUNITY)
Admission: EM | Admit: 2015-01-11 | Discharge: 2015-01-12 | Disposition: A | Discharge status: Home / Self Care | Payer: Medicare Other | Attending: Emergency Medicine | Admitting: Emergency Medicine

## 2015-01-11 DIAGNOSIS — W01198A Fall on same level from slipping, tripping and stumbling with subsequent striking against other object, initial encounter: Secondary | ICD-10-CM | POA: Insufficient documentation

## 2015-01-11 DIAGNOSIS — Z86010 Personal history of colonic polyps: Secondary | ICD-10-CM | POA: Diagnosis not present

## 2015-01-11 DIAGNOSIS — Z862 Personal history of diseases of the blood and blood-forming organs and certain disorders involving the immune mechanism: Secondary | ICD-10-CM | POA: Insufficient documentation

## 2015-01-11 DIAGNOSIS — K429 Umbilical hernia without obstruction or gangrene: Secondary | ICD-10-CM | POA: Insufficient documentation

## 2015-01-11 DIAGNOSIS — Z87891 Personal history of nicotine dependence: Secondary | ICD-10-CM | POA: Diagnosis not present

## 2015-01-11 DIAGNOSIS — I10 Essential (primary) hypertension: Secondary | ICD-10-CM | POA: Diagnosis not present

## 2015-01-11 DIAGNOSIS — K219 Gastro-esophageal reflux disease without esophagitis: Secondary | ICD-10-CM | POA: Insufficient documentation

## 2015-01-11 DIAGNOSIS — Z79899 Other long term (current) drug therapy: Secondary | ICD-10-CM | POA: Insufficient documentation

## 2015-01-11 DIAGNOSIS — Y999 Unspecified external cause status: Secondary | ICD-10-CM | POA: Insufficient documentation

## 2015-01-11 DIAGNOSIS — R404 Transient alteration of awareness: Secondary | ICD-10-CM | POA: Diagnosis not present

## 2015-01-11 DIAGNOSIS — R42 Dizziness and giddiness: Secondary | ICD-10-CM | POA: Insufficient documentation

## 2015-01-11 DIAGNOSIS — E785 Hyperlipidemia, unspecified: Secondary | ICD-10-CM | POA: Diagnosis not present

## 2015-01-11 DIAGNOSIS — R531 Weakness: Secondary | ICD-10-CM | POA: Diagnosis not present

## 2015-01-11 DIAGNOSIS — Y9289 Other specified places as the place of occurrence of the external cause: Secondary | ICD-10-CM | POA: Diagnosis not present

## 2015-01-11 DIAGNOSIS — W19XXXA Unspecified fall, initial encounter: Secondary | ICD-10-CM

## 2015-01-11 DIAGNOSIS — M1990 Unspecified osteoarthritis, unspecified site: Secondary | ICD-10-CM | POA: Diagnosis not present

## 2015-01-11 DIAGNOSIS — R112 Nausea with vomiting, unspecified: Secondary | ICD-10-CM | POA: Diagnosis not present

## 2015-01-11 DIAGNOSIS — Z8546 Personal history of malignant neoplasm of prostate: Secondary | ICD-10-CM | POA: Diagnosis not present

## 2015-01-11 DIAGNOSIS — S0990XA Unspecified injury of head, initial encounter: Secondary | ICD-10-CM | POA: Diagnosis not present

## 2015-01-11 DIAGNOSIS — Y939 Activity, unspecified: Secondary | ICD-10-CM | POA: Insufficient documentation

## 2015-01-11 LAB — CBC WITH DIFFERENTIAL/PLATELET
Basophils Absolute: 0 10*3/uL (ref 0.0–0.1)
Basophils Relative: 0 % (ref 0–1)
Eosinophils Absolute: 0.2 10*3/uL (ref 0.0–0.7)
Eosinophils Relative: 2 % (ref 0–5)
HCT: 30.5 % — ABNORMAL LOW (ref 39.0–52.0)
Hemoglobin: 8.7 g/dL — ABNORMAL LOW (ref 13.0–17.0)
LYMPHS ABS: 1 10*3/uL (ref 0.7–4.0)
Lymphocytes Relative: 10 % — ABNORMAL LOW (ref 12–46)
MCH: 20.8 pg — AB (ref 26.0–34.0)
MCHC: 28.5 g/dL — AB (ref 30.0–36.0)
MCV: 73 fL — ABNORMAL LOW (ref 78.0–100.0)
Monocytes Absolute: 0.7 10*3/uL (ref 0.1–1.0)
Monocytes Relative: 7 % (ref 3–12)
Neutro Abs: 7.6 10*3/uL (ref 1.7–7.7)
Neutrophils Relative %: 80 % — ABNORMAL HIGH (ref 43–77)
Platelets: 313 10*3/uL (ref 150–400)
RBC: 4.18 MIL/uL — ABNORMAL LOW (ref 4.22–5.81)
RDW: 17.6 % — ABNORMAL HIGH (ref 11.5–15.5)
WBC: 9.5 10*3/uL (ref 4.0–10.5)

## 2015-01-11 LAB — URINALYSIS, ROUTINE W REFLEX MICROSCOPIC
Bilirubin Urine: NEGATIVE
Glucose, UA: NEGATIVE mg/dL
Ketones, ur: NEGATIVE mg/dL
Nitrite: NEGATIVE
Protein, ur: 100 mg/dL — AB
Specific Gravity, Urine: 1.008 (ref 1.005–1.030)
Urobilinogen, UA: 0.2 mg/dL (ref 0.0–1.0)
pH: 7 (ref 5.0–8.0)

## 2015-01-11 LAB — I-STAT TROPONIN, ED: Troponin i, poc: 0 ng/mL (ref 0.00–0.08)

## 2015-01-11 LAB — URINE MICROSCOPIC-ADD ON

## 2015-01-11 LAB — BASIC METABOLIC PANEL WITH GFR
Anion gap: 9 (ref 5–15)
BUN: 14 mg/dL (ref 6–20)
CO2: 23 mmol/L (ref 22–32)
Calcium: 9 mg/dL (ref 8.9–10.3)
Chloride: 103 mmol/L (ref 101–111)
Creatinine, Ser: 1.47 mg/dL — ABNORMAL HIGH (ref 0.61–1.24)
GFR calc Af Amer: 49 mL/min — ABNORMAL LOW (ref >60)
GFR calc non Af Amer: 42 mL/min — ABNORMAL LOW (ref >60)
Glucose, Bld: 106 mg/dL — ABNORMAL HIGH (ref 65–99)
Potassium: 3.3 mmol/L — ABNORMAL LOW (ref 3.5–5.1)
Sodium: 135 mmol/L (ref 135–145)

## 2015-01-11 MED ORDER — SODIUM CHLORIDE 0.9 % IV BOLUS (SEPSIS)
1000.0000 mL | Freq: Once | INTRAVENOUS | Status: AC
  Administered 2015-01-11: 1000 mL via INTRAVENOUS

## 2015-01-11 MED ORDER — MECLIZINE HCL 25 MG PO TABS
25.0000 mg | ORAL_TABLET | Freq: Once | ORAL | Status: AC
  Administered 2015-01-11: 25 mg via ORAL
  Filled 2015-01-11: qty 1

## 2015-01-11 MED ORDER — DIPHENHYDRAMINE HCL 25 MG PO CAPS
25.0000 mg | ORAL_CAPSULE | Freq: Once | ORAL | Status: AC
  Administered 2015-01-11: 25 mg via ORAL
  Filled 2015-01-11: qty 1

## 2015-01-11 MED ORDER — ONDANSETRON HCL 4 MG/2ML IJ SOLN
4.0000 mg | Freq: Once | INTRAMUSCULAR | Status: AC
  Administered 2015-01-11: 4 mg via INTRAVENOUS
  Filled 2015-01-11: qty 2

## 2015-01-11 MED ORDER — POTASSIUM CHLORIDE CRYS ER 20 MEQ PO TBCR
20.0000 meq | EXTENDED_RELEASE_TABLET | Freq: Once | ORAL | Status: AC
  Administered 2015-01-11: 20 meq via ORAL
  Filled 2015-01-11: qty 1

## 2015-01-11 NOTE — ED Notes (Signed)
Bed: WA09 Expected date:  Expected time:  Means of arrival:  Comments: EMS 81 yo male nausea, vomiting and weakness

## 2015-01-11 NOTE — ED Notes (Signed)
Pt presents from home via EMS for N/V and generalized weakness after eating pizza. Denies pain or SOB.   20G left hand. 4mg  Zofran in route.  Last VS: 147/88, 81hr, 99%RA, 18resp, cbg 119.

## 2015-01-11 NOTE — ED Notes (Addendum)
Pt reports "got up to go outdoors, balance was off, dizziness, fell and hit head on tree". Denies LOC, or anticoagulants.

## 2015-01-11 NOTE — ED Notes (Signed)
Pt is comfortably resting in bed, alert and responding appropriately.  Family is at the bedside.  No needs expressed and no acute distress noted.

## 2015-01-11 NOTE — ED Provider Notes (Signed)
CSN: [id number]     Arrival date & time 01/11/15  2130 History   First MD Initiated Contact with Patient 01/11/15 2132     No chief complaint on file.    (Consider location/radiation/quality/duration/timing/severity/associated sxs/prior Treatment) HPI Mr. Clarida is an 81 y.o male with a history of anemia, hypertension, hyperlipidemia, prostate cancer who presents by EMS for a fall while outside. He states that he was getting up out of a chair, felt dizzy and off balance, went outside and fell, hitting his head on a tree. No loss of consciousness. He states he vomited several times afterward and is currently nauseous. He also states he felt dizzy in the ambulance like the room was spinning. He was given Zofran by EMS. He denies being on any anticoagulation medications. He denies any recent illness, fever, chest pain, shortness of breath, abdominal pain, leg swelling. He denies smoking.   Past Medical History  Diagnosis Date  . History of prostate cancer   . GERD (gastroesophageal reflux disease)   . Hypertension   . Kidney cysts     bilateral.  Renal US  . Hyperlipidemia   . Arthritis   . History of colon polyps    Past Surgical History  Procedure Laterality Date  . Cystoscopy  02/12/99    biopsy  . Prostatectomy  02/1999  . Inguinal hernia repair  02/1999    Dr. Reece Agar  . Colonoscopy    . Esophagogastroduodenoscopy N/A 08/04/2012    Procedure: ESOPHAGOGASTRODUODENOSCOPY (EGD);  Surgeon: Inda Castle, MD;  Location: [facility];  Service: Endoscopy;  Laterality: N/A;  . Esophageal banding N/A 08/04/2012    Procedure: ESOPHAGEAL BANDING;  Surgeon: Inda Castle, MD;  Location: [facility];  Service: Endoscopy;  Laterality: N/A;   Family History  Problem Relation Age of Onset  . Bone cancer Sister   . Stomach cancer Brother   . Stomach cancer Brother   . Lung cancer Sister   . Colon cancer Sister   . Colon cancer Other     nephew  . Stroke Mother    History  Substance  Use Topics  . Smoking status: Former Smoker    Types: Cigarettes    Quit date: 08/26/2001  . Smokeless tobacco: Current User    Types: Chew     Comment: quit about 10 years or more  . Alcohol Use: 8.4 oz/week    14 Cans of beer per week     Comment: 14 per week    Review of Systems  Constitutional: Negative for fever.  Eyes: Negative for visual disturbance.  Respiratory: Negative for shortness of breath.   Cardiovascular: Negative for chest pain.  Gastrointestinal: Positive for nausea and vomiting.  Musculoskeletal: Positive for gait problem.  Neurological: Positive for dizziness. Negative for syncope, facial asymmetry, speech difficulty, weakness, numbness and headaches.  All other systems reviewed and are negative.     Allergies  Lisinopril  Home Medications   Prior to Admission medications   Medication Sig Start Date End Date Taking? Authorizing Provider  amlodipine-atorvastatin (CADUET) 10-10 MG per tablet TAKE 1 TABLET BY MOUTH DAILY 11/27/14  Yes Tonia Ghent, MD  metoprolol succinate (TOPROL-XL) 50 MG 24 hr tablet TAKE 1 TABLET BY MOUTH IMMEDIATELY FOLLWING A MEAL 08/25/14  Yes Tonia Ghent, MD  omeprazole (PRILOSEC) 20 MG capsule Take 20 mg by mouth daily.     Yes Historical Provider, MD  amlodipine-atorvastatin (CADUET) 10-10 MG per tablet TAKE 1 TABLET BY MOUTH DAILY Patient not  taking: Reported on 01/11/2015 11/24/14   Tonia Ghent, MD   BP 155/84 mmHg  Pulse 82  Temp(Src) 97.9 F (36.6 C) (Oral)  Resp 18  SpO2 93% Physical Exam  Constitutional: He is oriented to person, place, and time. He appears well-developed and well-nourished.  HENT:  Head: Normocephalic.  Eyes: Conjunctivae and EOM are normal.  Neck: Normal range of motion. Neck supple.  Cardiovascular: Normal rate, regular rhythm and normal heart sounds.   Pulmonary/Chest: Effort normal and breath sounds normal. No respiratory distress. He has no wheezes. He has no rales. He exhibits no  tenderness.  Abdominal: Soft. He exhibits no distension and no mass. There is no tenderness. There is no rigidity, no rebound and no guarding.  Umbilical hernia.  Musculoskeletal: Normal range of motion.  Neurological: He is alert and oriented to person, place, and time. He has normal strength. No sensory deficit. He displays a negative Romberg sign. GCS eye subscore is 4. GCS verbal subscore is 5. GCS motor subscore is 6.  Cranial nurse 3 through 12 intact.   Skin: Skin is warm and dry.  Psychiatric: He has a normal mood and affect. His behavior is normal.  Nursing note and vitals reviewed.   ED Course  Procedures (including critical care time) Labs Review Labs Reviewed  CBC WITH DIFFERENTIAL/PLATELET - Abnormal; Notable for the following:    RBC 4.18 (*)    Hemoglobin 8.7 (*)    HCT 30.5 (*)    MCV 73.0 (*)    MCH 20.8 (*)    MCHC 28.5 (*)    RDW 17.6 (*)    Neutrophils Relative % 80 (*)    Lymphocytes Relative 10 (*)    All other components within normal limits  BASIC METABOLIC PANEL - Abnormal; Notable for the following:    Potassium 3.3 (*)    Glucose, Bld 106 (*)    Creatinine, Ser 1.47 (*)    GFR calc non Af Amer 42 (*)    GFR calc Af Amer 49 (*)    All other components within normal limits  URINALYSIS, ROUTINE W REFLEX MICROSCOPIC (NOT AT [facility]) - Abnormal; Notable for the following:    Hgb urine dipstick TRACE (*)    Protein, ur 100 (*)    Leukocytes, UA MODERATE (*)    All other components within normal limits  URINE MICROSCOPIC-ADD ON - Abnormal; Notable for the following:    Casts HYALINE CASTS (*)    All other components within normal limits  URINE CULTURE  I-STAT TROPOININ, ED    Imaging Review Dg Chest 2 View  01/11/2015   CLINICAL DATA:  Nausea vomiting and generalized weakness  EXAM: CHEST  2 VIEW  COMPARISON:  None.  FINDINGS: The heart size and mediastinal contours are within normal limits. Both lungs are clear. The visualized skeletal structures are  unremarkable.  IMPRESSION: No active cardiopulmonary disease.   Electronically Signed   By: Andreas Newport M.D.   On: 01/11/2015 23:14   Ct Head Wo Contrast  01/11/2015   CLINICAL DATA:  Became dizzy and off balance, fell outside hitting head on tree. No loss of consciousness. History of prostate cancer, hypertension, hyperlipidemia.  EXAM: CT HEAD WITHOUT CONTRAST  TECHNIQUE: Contiguous axial images were obtained from the base of the skull through the vertex without intravenous contrast.  COMPARISON:  CT head June 03, 2006  FINDINGS: The ventricles and sulci are normal for age. No intraparenchymal hemorrhage, mass effect nor midline shift. Patchy supratentorial  white matter hypodensities are less than expected for patient's age and though non-specific suggest sequelae of chronic small vessel ischemic disease. No acute large vascular territory infarcts.  No abnormal extra-axial fluid collections. Basal cisterns are patent. Moderate calcific atherosclerosis of the carotid siphons and included vertebral arteries.  No skull fracture. The included ocular globes and orbital contents are non-suspicious. Status post bilateral ocular lens implants. Mild paranasal sinus mucosal thickening without air-fluid levels. Mastoid air cells are well aerated. Soft tissue within the external auditory canals compatible with cerumen.  IMPRESSION: No acute intracranial process ; normal noncontrast CT head for age.   Electronically Signed   By: Elon Alas M.D.   On: 01/11/2015 23:07     EKG Interpretation   Date/Time:  Thursday January 11 2015 21:43:08 EDT Ventricular Rate:  79 PR Interval:  318 QRS Duration: 116 QT Interval:  407 QTC Calculation: 467 R Axis:   -59 Text Interpretation:  Sinus rhythm Prolonged PR interval Left anterior  fascicular block Probable lateral infarct, old No comparison since 2000  Confirmed by [place]  MD, [place] ([id number]) on 01/11/2015 9:49:28 PM      MDM   Final diagnoses:  Fall,  initial encounter  Dizziness   Patient presents for dizziness, fall, n/v. His troponin is negative and EKG is not concerning. His vitals are stable and he is well-appearing. His labs show anemia with a hemoglobin of 8.7. He also has mild hypokalemia. He has moderate leukocytes but no urinary symptoms.  I have sent a urine culture but will not treat at this time. Chest x-ray is negative for pneumonia or edema. CT head shows no acute intracranial process. I discussed this patient with Dr. Jeneen Rinks who has seen and evaluated the patient. He believed the patient's symptoms are consistent with vertigo. He is not orthostatic. Medications  ondansetron (ZOFRAN) injection 4 mg (4 mg Intravenous Given 01/11/15 2205)  sodium chloride 0.9 % bolus 1,000 mL (1,000 mLs Intravenous New Bag/Given 01/11/15 2314)  diphenhydrAMINE (BENADRYL) capsule 25 mg (25 mg Oral Given 01/11/15 2314)  meclizine (ANTIVERT) tablet 25 mg (25 mg Oral Given 01/11/15 2314)  ondansetron (ZOFRAN) injection 4 mg (4 mg Intravenous Given 01/11/15 2314)  potassium chloride SA (K-DUR,KLOR-CON) CR tablet 20 mEq (20 mEq Oral Given 01/11/15 2351)  Recheck: He states he is feeling better.  I feel comfortable sending him home with strict return precautions which I discussed with him and his son. Patient has a follow-up appointment tomorrow at 8:45 am which I discussed keeping. He agrees with the plan.    Ottie Glazier, PA-C 01/12/15 0018  Tanna Furry, MD 01/16/15 [phone]

## 2015-01-12 ENCOUNTER — Encounter: Payer: Self-pay | Admitting: Family Medicine

## 2015-01-12 ENCOUNTER — Ambulatory Visit (INDEPENDENT_AMBULATORY_CARE_PROVIDER_SITE_OTHER): Payer: Medicare Other | Admitting: Family Medicine

## 2015-01-12 VITALS — BP 132/70 | HR 86 | Temp 97.7°F | Ht 68.0 in | Wt 169.0 lb

## 2015-01-12 DIAGNOSIS — Z23 Encounter for immunization: Secondary | ICD-10-CM | POA: Diagnosis not present

## 2015-01-12 DIAGNOSIS — E7800 Pure hypercholesterolemia, unspecified: Secondary | ICD-10-CM

## 2015-01-12 DIAGNOSIS — Z125 Encounter for screening for malignant neoplasm of prostate: Secondary | ICD-10-CM

## 2015-01-12 DIAGNOSIS — Z7189 Other specified counseling: Secondary | ICD-10-CM

## 2015-01-12 DIAGNOSIS — H8110 Benign paroxysmal vertigo, unspecified ear: Secondary | ICD-10-CM

## 2015-01-12 DIAGNOSIS — I10 Essential (primary) hypertension: Secondary | ICD-10-CM | POA: Diagnosis not present

## 2015-01-12 DIAGNOSIS — D649 Anemia, unspecified: Secondary | ICD-10-CM

## 2015-01-12 DIAGNOSIS — Z0000 Encounter for general adult medical examination without abnormal findings: Secondary | ICD-10-CM

## 2015-01-12 MED ORDER — IRON 325 (65 FE) MG PO TABS: 325.0000 mg | ORAL_TABLET | Freq: Every day | ORAL | Status: DC

## 2015-01-12 NOTE — Discharge Instructions (Signed)
Dizziness Hgb 8.7.   K 3.3.  Keep your follow up appointment tomorrow with your pcp at 8:45am. Return for worsening symptoms.  Dizziness is a common problem. It is a feeling of unsteadiness or light-headedness. You may feel like you are about to faint. Dizziness can lead to injury if you stumble or fall. A person of any age group can suffer from dizziness, but dizziness is more common in older adults. CAUSES  Dizziness can be caused by many different things, including:  Middle ear problems.  Standing for too long.  Infections.  An allergic reaction.  Aging.  An emotional response to something, such as the sight of blood.  Side effects of medicines.  Tiredness.  Problems with circulation or blood pressure.  Excessive use of alcohol or medicines, or illegal drug use.  Breathing too fast (hyperventilation).  An irregular heart rhythm (arrhythmia).  A low red blood cell count (anemia).  Pregnancy.  Vomiting, diarrhea, fever, or other illnesses that cause body fluid loss (dehydration).  Diseases or conditions such as Parkinson's disease, high blood pressure (hypertension), diabetes, and thyroid problems.  Exposure to extreme heat. DIAGNOSIS  Your health care provider will ask about your symptoms, perform a physical exam, and perform an electrocardiogram (ECG) to record the electrical activity of your heart. Your health care provider may also perform other heart or blood tests to determine the cause of your dizziness. These may include:  Transthoracic echocardiogram (TTE). During echocardiography, sound waves are used to evaluate how blood flows through your heart.  Transesophageal echocardiogram (TEE).  Cardiac monitoring. This allows your health care provider to monitor your heart rate and rhythm in real time.  Holter monitor. This is a portable device that records your heartbeat and can help diagnose heart arrhythmias. It allows your health care provider to track your  heart activity for several days if needed.  Stress tests by exercise or by giving medicine that makes the heart beat faster. TREATMENT  Treatment of dizziness depends on the cause of your symptoms and can vary greatly. HOME CARE INSTRUCTIONS   Drink enough fluids to keep your urine clear or pale yellow. This is especially important in very hot weather. In older adults, it is also important in cold weather.  Take your medicine exactly as directed if your dizziness is caused by medicines. When taking blood pressure medicines, it is especially important to get up slowly.  Rise slowly from chairs and steady yourself until you feel okay.  In the morning, first sit up on the side of the bed. When you feel okay, stand slowly while holding onto something until you know your balance is fine.  Move your legs often if you need to stand in one place for a long time. Tighten and relax your muscles in your legs while standing.  Have someone stay with you for 1-2 days if dizziness continues to be a problem. Do this until you feel you are well enough to stay alone. Have the person call your health care provider if he or she notices changes in you that are concerning.  Do not drive or use heavy machinery if you feel dizzy.  Do not drink alcohol. SEEK IMMEDIATE MEDICAL CARE IF:   Your dizziness or light-headedness gets worse.  You feel nauseous or vomit.  You have problems talking, walking, or using your arms, hands, or legs.  You feel weak.  You are not thinking clearly or you have trouble forming sentences. It may take a friend or family  member to notice this.  You have chest pain, abdominal pain, shortness of breath, or sweating.  Your vision changes.  You notice any bleeding.  You have side effects from medicine that seems to be getting worse rather than better. MAKE SURE YOU:   Understand these instructions.  Will watch your condition.  Will get help right away if you are not doing  well or get worse. Document Released: 11/19/2000 Document Revised: 05/31/2013 Document Reviewed: 12/13/2010 [facility] Patient Information 2015 [place], [place]. This information is not intended to replace advice given to you by your health care provider. Make sure you discuss any questions you have with your health care provider.

## 2015-01-12 NOTE — Patient Instructions (Signed)
Pneumonia vaccine today.  Go to the lab on the way out.  Pick up the stool cards and schedule a fasting labs appointment for early next week.  We'll contact you with your lab report. Start taking iron 325mg  a day.  It may make your stools turn darker.  We'll make plans when I see your labs.   Take care.  Glad to see you.

## 2015-01-12 NOTE — Progress Notes (Signed)
Pre visit review using our clinic review tool, if applicable. No additional management support is needed unless otherwise documented below in the visit note.  Patient was taken to ER yesterday by EMS after falling and hitting his head on a tree.  I have personally reviewed the Medicare Annual Wellness questionnaire and have noted 1. The patient's medical and social history 2. Their use of alcohol, tobacco or illicit drugs 3. Their current medications and supplements 4. The patient's functional ability including ADL's, fall risks, home safety risks and hearing or visual             impairment. 5. Diet and physical activities 6. Evidence for depression or mood disorders  The patients weight, height, BMI have been recorded in the chart and visual acuity is per eye clinic.  I have made referrals, counseling and provided education to the patient based review of the above and I have provided the pt with a written personalized care plan for preventive services.  Provider list updated- see scanned forms.  Routine anticipatory guidance given to patient.  See health maintenance.  Flu prev done, encouraged Shingles deferred, getting PNA vaccine at OV 01/2015 PNA updated 2016 Tetanus 2009 Colonoscopy NA due to age PSA pending.  Advance directive- wife designated if patient were incapacitated.   Cognitive function addressed- see scanned forms- and if abnormal then additional documentation follows.   To ER with vertigo yesterday, quick onset of sx yesterday.  [place] and hit a tree when his sx started.  No LOC.  Not dizzy now.  No CP, no SOB.  Feels better now.  ER notes and labs reviewed.    H/o abdominal LA and had seen Dr. Janice Norrie.  Neg w/u per urology.    Hypertension:    Using medication without problems or lightheadedness: yes Chest pain with exertion:no Edema:no Short of breath:no  Elevated Cholesterol: Using medications without problems:yes Muscle aches: no Diet compliance:  encouraged Exercise: limited, age expected.    Anemia noted on ER labs.  No bleeding, no black stools, no blood in stool per patient. No known cause of loss per patient but h/o banding noted.  Isn't lightheaded.    PMH and SH reviewed  Meds, vitals, and allergies reviewed.   ROS: See HPI.  Otherwise negative.    GEN: nad, alert and oriented HEENT: mucous membranes moist NECK: supple w/o LA CV: rrr. PULM: ctab, no inc wob ABD: soft, +bs EXT: no edema SKIN: no acute rash

## 2015-01-14 LAB — URINE CULTURE

## 2015-01-16 DIAGNOSIS — Z7189 Other specified counseling: Secondary | ICD-10-CM | POA: Insufficient documentation

## 2015-01-16 DIAGNOSIS — D649 Anemia, unspecified: Secondary | ICD-10-CM

## 2015-01-16 DIAGNOSIS — H8110 Benign paroxysmal vertigo, unspecified ear: Secondary | ICD-10-CM | POA: Insufficient documentation

## 2015-01-16 HISTORY — DX: Anemia, unspecified: D64.9

## 2015-01-16 NOTE — Assessment & Plan Note (Signed)
Now resolved.  

## 2015-01-16 NOTE — Assessment & Plan Note (Signed)
Flu prev done, encouraged Shingles deferred, getting PNA vaccine at OV 01/2015 PNA updated 2016 Tetanus 2009 Colonoscopy NA due to age PSA pending.  Advance directive- wife designated if patient were incapacitated.   Cognitive function addressed- see scanned forms- and if abnormal then additional documentation follows.

## 2015-01-16 NOTE — Assessment & Plan Note (Signed)
Continue statin, return for labs.  He agrees.

## 2015-01-16 NOTE — Assessment & Plan Note (Signed)
Return for labs, will start iron in the meantime with routine cautions.  He agrees.  Likely was an incidental finding at the ER.

## 2015-01-16 NOTE — Assessment & Plan Note (Signed)
No change in meds, return for labs.  He agrees.

## 2015-01-18 ENCOUNTER — Other Ambulatory Visit (INDEPENDENT_AMBULATORY_CARE_PROVIDER_SITE_OTHER): Payer: Medicare Other

## 2015-01-18 DIAGNOSIS — Z125 Encounter for screening for malignant neoplasm of prostate: Secondary | ICD-10-CM | POA: Diagnosis not present

## 2015-01-18 DIAGNOSIS — I10 Essential (primary) hypertension: Secondary | ICD-10-CM | POA: Diagnosis not present

## 2015-01-18 DIAGNOSIS — D649 Anemia, unspecified: Secondary | ICD-10-CM

## 2015-01-18 LAB — LIPID PANEL
CHOLESTEROL: 135 mg/dL (ref 0–200)
HDL: 43.3 mg/dL (ref >39.00)
LDL CALC: 75 mg/dL (ref 0–99)
NonHDL: 91.98
Total CHOL/HDL Ratio: 3
Triglycerides: 86 mg/dL (ref 0.0–149.0)
VLDL: 17.2 mg/dL (ref 0.0–40.0)

## 2015-01-18 LAB — CBC WITH DIFFERENTIAL/PLATELET
BASOS ABS: 0 10*3/uL (ref 0.0–0.1)
Basophils Relative: 0.6 % (ref 0.0–3.0)
EOS PCT: 4.7 % (ref 0.0–5.0)
Eosinophils Absolute: 0.4 10*3/uL (ref 0.0–0.7)
HEMATOCRIT: 27 % — AB (ref 39.0–52.0)
Hemoglobin: 8.5 g/dL — ABNORMAL LOW (ref 13.0–17.0)
LYMPHS ABS: 1 10*3/uL (ref 0.7–4.0)
Lymphocytes Relative: 13.2 % (ref 12.0–46.0)
MCHC: 31.3 g/dL (ref 30.0–36.0)
MCV: 67.9 fl — ABNORMAL LOW (ref 78.0–100.0)
Monocytes Absolute: 0.7 10*3/uL (ref 0.1–1.0)
Monocytes Relative: 9.7 % (ref 3.0–12.0)
Neutro Abs: 5.5 10*3/uL (ref 1.4–7.7)
Neutrophils Relative %: 71.8 % (ref 43.0–77.0)
PLATELETS: 306 10*3/uL (ref 150.0–400.0)
RBC: 3.98 Mil/uL — ABNORMAL LOW (ref 4.22–5.81)
RDW: 17.9 % — AB (ref 11.5–15.5)
WBC: 7.7 10*3/uL (ref 4.0–10.5)

## 2015-01-18 LAB — BASIC METABOLIC PANEL
BUN: 14 mg/dL (ref 6–23)
CHLORIDE: 105 meq/L (ref 96–112)
CO2: 27 meq/L (ref 19–32)
CREATININE: 1.59 mg/dL — AB (ref 0.40–1.50)
Calcium: 8.5 mg/dL (ref 8.4–10.5)
GFR: 53.63 mL/min — ABNORMAL LOW (ref >60.00)
Glucose, Bld: 102 mg/dL — ABNORMAL HIGH (ref 70–99)
POTASSIUM: 4 meq/L (ref 3.5–5.1)
Sodium: 139 mEq/L (ref 135–145)

## 2015-01-18 LAB — IBC PANEL
Iron: 17 ug/dL — ABNORMAL LOW (ref 42–165)
Saturation Ratios: 4.1 % — ABNORMAL LOW (ref 20.0–50.0)
Transferrin: 293 mg/dL (ref 212.0–360.0)

## 2015-01-18 LAB — PSA, MEDICARE: PSA: 0 ng/ml — ABNORMAL LOW (ref 0.10–4.00)

## 2015-01-19 ENCOUNTER — Other Ambulatory Visit: Payer: Self-pay | Admitting: Family Medicine

## 2015-01-19 DIAGNOSIS — D649 Anemia, unspecified: Secondary | ICD-10-CM

## 2015-01-19 LAB — FECAL OCCULT BLOOD, IMMUNOCHEMICAL: FECAL OCCULT BLD: POSITIVE — AB

## 2015-02-02 ENCOUNTER — Other Ambulatory Visit (INDEPENDENT_AMBULATORY_CARE_PROVIDER_SITE_OTHER): Payer: Medicare Other

## 2015-02-02 DIAGNOSIS — D649 Anemia, unspecified: Secondary | ICD-10-CM | POA: Diagnosis not present

## 2015-02-02 LAB — CBC WITH DIFFERENTIAL/PLATELET
BASOS ABS: 0 10*3/uL (ref 0.0–0.1)
BASOS PCT: 0.2 % (ref 0.0–3.0)
EOS ABS: 0.3 10*3/uL (ref 0.0–0.7)
Eosinophils Relative: 2 % (ref 0.0–5.0)
HEMATOCRIT: 29.8 % — AB (ref 39.0–52.0)
HEMOGLOBIN: 9.3 g/dL — AB (ref 13.0–17.0)
LYMPHS PCT: 7.5 % — AB (ref 12.0–46.0)
Lymphs Abs: 1 10*3/uL (ref 0.7–4.0)
MCHC: 31 g/dL (ref 30.0–36.0)
MCV: 70.4 fl — ABNORMAL LOW (ref 78.0–100.0)
Monocytes Absolute: 0.9 10*3/uL (ref 0.1–1.0)
Monocytes Relative: 6.6 % (ref 3.0–12.0)
Neutro Abs: 11.1 10*3/uL — ABNORMAL HIGH (ref 1.4–7.7)
Neutrophils Relative %: 83.7 % — ABNORMAL HIGH (ref 43.0–77.0)
Platelets: 387 10*3/uL (ref 150.0–400.0)
RBC: 4.24 Mil/uL (ref 4.22–5.81)
RDW: 22.8 % — ABNORMAL HIGH (ref 11.5–15.5)
WBC: 13.3 10*3/uL — AB (ref 4.0–10.5)

## 2015-02-02 LAB — IRON: Iron: 9 ug/dL — ABNORMAL LOW (ref 42–165)

## 2015-02-05 ENCOUNTER — Other Ambulatory Visit: Payer: Self-pay | Admitting: Family Medicine

## 2015-02-05 DIAGNOSIS — D509 Iron deficiency anemia, unspecified: Secondary | ICD-10-CM

## 2015-02-16 ENCOUNTER — Encounter: Payer: Self-pay | Admitting: Gastroenterology

## 2015-02-16 ENCOUNTER — Other Ambulatory Visit (INDEPENDENT_AMBULATORY_CARE_PROVIDER_SITE_OTHER): Payer: Medicare Other

## 2015-02-16 ENCOUNTER — Other Ambulatory Visit: Payer: Self-pay | Admitting: Family Medicine

## 2015-02-16 DIAGNOSIS — D509 Iron deficiency anemia, unspecified: Secondary | ICD-10-CM | POA: Diagnosis not present

## 2015-02-16 DIAGNOSIS — D649 Anemia, unspecified: Secondary | ICD-10-CM

## 2015-02-16 LAB — CBC WITH DIFFERENTIAL/PLATELET
BASOS PCT: 0.6 % (ref 0.0–3.0)
Basophils Absolute: 0 10*3/uL (ref 0.0–0.1)
EOS PCT: 4.9 % (ref 0.0–5.0)
Eosinophils Absolute: 0.3 10*3/uL (ref 0.0–0.7)
HCT: 31.2 % — ABNORMAL LOW (ref 39.0–52.0)
HEMOGLOBIN: 9.8 g/dL — AB (ref 13.0–17.0)
LYMPHS PCT: 13.5 % (ref 12.0–46.0)
Lymphs Abs: 0.9 10*3/uL (ref 0.7–4.0)
MCHC: 31.4 g/dL (ref 30.0–36.0)
MCV: 72.9 fl — ABNORMAL LOW (ref 78.0–100.0)
MONO ABS: 0.5 10*3/uL (ref 0.1–1.0)
Monocytes Relative: 7.5 % (ref 3.0–12.0)
Neutro Abs: 5.1 10*3/uL (ref 1.4–7.7)
Neutrophils Relative %: 73.5 % (ref 43.0–77.0)
Platelets: 346 10*3/uL (ref 150.0–400.0)
RBC: 4.28 Mil/uL (ref 4.22–5.81)
RDW: 25.4 % — AB (ref 11.5–15.5)
WBC: 7 10*3/uL (ref 4.0–10.5)

## 2015-02-16 LAB — IRON: IRON: 24 ug/dL — AB (ref 42–165)

## 2015-03-14 ENCOUNTER — Other Ambulatory Visit (INDEPENDENT_AMBULATORY_CARE_PROVIDER_SITE_OTHER): Payer: Medicare Other

## 2015-03-14 DIAGNOSIS — D649 Anemia, unspecified: Secondary | ICD-10-CM

## 2015-03-14 LAB — CBC WITH DIFFERENTIAL/PLATELET
BASOS PCT: 0.3 % (ref 0.0–3.0)
Basophils Absolute: 0 10*3/uL (ref 0.0–0.1)
EOS PCT: 2.8 % (ref 0.0–5.0)
Eosinophils Absolute: 0.2 10*3/uL (ref 0.0–0.7)
HCT: 37.6 % — ABNORMAL LOW (ref 39.0–52.0)
Hemoglobin: 12.1 g/dL — ABNORMAL LOW (ref 13.0–17.0)
LYMPHS ABS: 0.8 10*3/uL (ref 0.7–4.0)
Lymphocytes Relative: 10 % — ABNORMAL LOW (ref 12.0–46.0)
MCHC: 32.1 g/dL (ref 30.0–36.0)
MCV: 78.7 fl (ref 78.0–100.0)
MONO ABS: 0.5 10*3/uL (ref 0.1–1.0)
MONOS PCT: 6.8 % (ref 3.0–12.0)
NEUTROS PCT: 80.1 % — AB (ref 43.0–77.0)
Neutro Abs: 6.5 10*3/uL (ref 1.4–7.7)
Platelets: 228 10*3/uL (ref 150.0–400.0)
RBC: 4.78 Mil/uL (ref 4.22–5.81)
RDW: 26.8 % — AB (ref 11.5–15.5)
WBC: 8.1 10*3/uL (ref 4.0–10.5)

## 2015-04-11 ENCOUNTER — Ambulatory Visit: Payer: Medicare Other | Admitting: Gastroenterology

## 2015-04-11 ENCOUNTER — Ambulatory Visit (INDEPENDENT_AMBULATORY_CARE_PROVIDER_SITE_OTHER): Payer: Medicare Other | Admitting: Gastroenterology

## 2015-04-11 ENCOUNTER — Encounter: Payer: Self-pay | Admitting: Gastroenterology

## 2015-04-11 VITALS — BP 150/80 | HR 76 | Ht 68.0 in | Wt 168.6 lb

## 2015-04-11 DIAGNOSIS — R195 Other fecal abnormalities: Secondary | ICD-10-CM | POA: Diagnosis not present

## 2015-04-11 DIAGNOSIS — D509 Iron deficiency anemia, unspecified: Secondary | ICD-10-CM | POA: Insufficient documentation

## 2015-04-11 NOTE — Patient Instructions (Signed)
You have been given a separate informational sheet regarding your tobacco use, the importance of quitting and local resources to help you quit.  

## 2015-04-11 NOTE — Progress Notes (Signed)
     04/11/2015 Rick [name] [id number] 07-Dec-1928   History of Present Illness:  This is a pleasant 81 year old male who is previously known to Dr. Deatra [name]. He had been seen here in the past for anemia and heme-positive stools. 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 The polyp was a tubular adenoma, but no repeat colonoscopy was recommended due to age. Then, on November 2013, he underwent a small bowel endoscopy where he was found have a friable polyp in the stomach; this was biopsied and was a hyperplastic type polyp without H. pylori. EGD was then repeated in February 2013 at which time the polyp was removed with band ligation.  He returns to our office today once again from his PCP, Dr. [name] for evaluation regarding anemia and one recent heme positive stool in the form of an IFOB.  His hemoglobin had been running anywhere from 8.7-10.1 grams over the past year, however, he was recently placed back on iron supplementation 325 mg daily and his most recent hemoglobin on October 5 was improved at 12.1 g. The patient denies seeing any blood in his stools. Now that he is on iron his stools have been darker in color, but prior to that he did not have any dark black colored stools. He does have some long-standing acid reflux that is well controlled on daily PPI.  Current Medications, Allergies, Past Medical History, Past Surgical History, Family History and Social History were reviewed in [employer] record.   Physical Exam: BP 150/80 mmHg  Pulse 76  Ht 5\' 8"  (1.727 m)  Wt 168 lb 9.6 oz (76.476 kg)  BMI 25.64 kg/m2 General: Well developed black male in no acute distress Head: Normocephalic and atraumatic Eyes:  Sclerae anicteric, conjunctiva pink  Ears: Normal auditory acuity Lungs: Clear throughout to auscultation Heart: Regular rate and rhythm Abdomen: Soft, non-distended.  Normal bowel  sounds.  Non-tender. Musculoskeletal: Symmetrical with no gross deformities  Extremities: No edema  Neurological: Alert oriented x 4, grossly non-focal Psychological:  Alert and cooperative. Normal mood and affect  Assessment and Recommendations: -Recurrent anemia and heme positive stool:  Had extensive GI evaluation in the past with small colon AVM and a friable hyperplastic polyp that was removed from the stomach. 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 It appears he has responded well to iron supplementation that was restarted recently with good improvement in his hemoglobin. Discussed with Dr. Silverio Decamp; we'll hold off on repeat evaluation at this time. Would consider continuing iron supplementation and monitoring of his hemoglobin via his PCP.

## 2015-04-12 NOTE — Progress Notes (Signed)
Reviewed and agree with documentation and assessment and plan. K. Veena Nandigam , MD   

## 2015-04-18 ENCOUNTER — Telehealth: Payer: Self-pay | Admitting: Family Medicine

## 2015-04-18 DIAGNOSIS — R195 Other fecal abnormalities: Secondary | ICD-10-CM

## 2015-04-18 DIAGNOSIS — D509 Iron deficiency anemia, unspecified: Secondary | ICD-10-CM

## 2015-04-18 NOTE — Telephone Encounter (Signed)
Call pt.  Saw the note from GI.  Would continue iron for now.  Would recheck labs about every 3 months.  I put in the orders.   If more bleeding in the meantime, then please let me know.  Thanks.

## 2015-04-18 NOTE — Telephone Encounter (Signed)
Left message at home number and cell for patient to call back.

## 2015-04-19 NOTE — Telephone Encounter (Signed)
Left detailed message on voicemail.  

## 2015-04-27 DIAGNOSIS — H401132 Primary open-angle glaucoma, bilateral, moderate stage: Secondary | ICD-10-CM | POA: Diagnosis not present

## 2015-04-27 DIAGNOSIS — Z961 Presence of intraocular lens: Secondary | ICD-10-CM | POA: Diagnosis not present

## 2015-04-27 DIAGNOSIS — H11043 Peripheral pterygium, stationary, bilateral: Secondary | ICD-10-CM | POA: Diagnosis not present

## 2015-05-10 DIAGNOSIS — Z8546 Personal history of malignant neoplasm of prostate: Secondary | ICD-10-CM | POA: Diagnosis not present

## 2015-05-17 DIAGNOSIS — Z8546 Personal history of malignant neoplasm of prostate: Secondary | ICD-10-CM | POA: Diagnosis not present

## 2015-11-17 ENCOUNTER — Other Ambulatory Visit: Payer: Self-pay | Admitting: Family Medicine

## 2015-11-28 ENCOUNTER — Other Ambulatory Visit: Payer: Self-pay | Admitting: Family Medicine

## 2016-02-14 ENCOUNTER — Other Ambulatory Visit: Payer: Self-pay | Admitting: Family Medicine

## 2016-02-14 NOTE — Telephone Encounter (Signed)
Electronic refill request. Last office visit:   01/12/15  Last Filled:    90 tablet 0 11/19/2015  With notation to schedule OV prior to further refills.  No appts scheduled.  Please advise.

## 2016-02-15 NOTE — Telephone Encounter (Signed)
Fill when he has OV scheduled. Thanks.

## 2016-02-15 NOTE — Telephone Encounter (Signed)
Left detailed message on voicemail of preferred number.

## 2016-02-21 ENCOUNTER — Encounter: Payer: Self-pay | Admitting: Family Medicine

## 2016-02-21 ENCOUNTER — Ambulatory Visit (INDEPENDENT_AMBULATORY_CARE_PROVIDER_SITE_OTHER): Payer: Medicare Other | Admitting: Family Medicine

## 2016-02-21 VITALS — BP 144/82 | HR 62 | Temp 98.5°F | Wt 169.5 lb

## 2016-02-21 DIAGNOSIS — Z125 Encounter for screening for malignant neoplasm of prostate: Secondary | ICD-10-CM

## 2016-02-21 DIAGNOSIS — I10 Essential (primary) hypertension: Secondary | ICD-10-CM

## 2016-02-21 DIAGNOSIS — E7800 Pure hypercholesterolemia, unspecified: Secondary | ICD-10-CM

## 2016-02-21 DIAGNOSIS — Z8546 Personal history of malignant neoplasm of prostate: Secondary | ICD-10-CM

## 2016-02-21 DIAGNOSIS — D509 Iron deficiency anemia, unspecified: Secondary | ICD-10-CM

## 2016-02-21 LAB — CBC WITH DIFFERENTIAL/PLATELET
BASOS PCT: 0.3 % (ref 0.0–3.0)
Basophils Absolute: 0 10*3/uL (ref 0.0–0.1)
EOS ABS: 0.1 10*3/uL (ref 0.0–0.7)
EOS PCT: 1.5 % (ref 0.0–5.0)
HCT: 43.5 % (ref 39.0–52.0)
Hemoglobin: 15 g/dL (ref 13.0–17.0)
LYMPHS ABS: 1 10*3/uL (ref 0.7–4.0)
Lymphocytes Relative: 10.2 % — ABNORMAL LOW (ref 12.0–46.0)
MCHC: 34.4 g/dL (ref 30.0–36.0)
MCV: 90.2 fl (ref 78.0–100.0)
MONO ABS: 0.6 10*3/uL (ref 0.1–1.0)
Monocytes Relative: 6.3 % (ref 3.0–12.0)
NEUTROS PCT: 81.7 % — AB (ref 43.0–77.0)
Neutro Abs: 8.3 10*3/uL — ABNORMAL HIGH (ref 1.4–7.7)
PLATELETS: 164 10*3/uL (ref 150.0–400.0)
RBC: 4.82 Mil/uL (ref 4.22–5.81)
RDW: 14.2 % (ref 11.5–15.5)
WBC: 10.2 10*3/uL (ref 4.0–10.5)

## 2016-02-21 LAB — IBC PANEL
IRON: 81 ug/dL (ref 42–165)
SATURATION RATIOS: 25.6 % (ref 20.0–50.0)
TRANSFERRIN: 226 mg/dL (ref 212.0–360.0)

## 2016-02-21 LAB — COMPREHENSIVE METABOLIC PANEL
ALT: 13 U/L (ref 0–53)
AST: 18 U/L (ref 0–37)
Albumin: 3.9 g/dL (ref 3.5–5.2)
Alkaline Phosphatase: 64 U/L (ref 39–117)
BUN: 12 mg/dL (ref 6–23)
CHLORIDE: 102 meq/L (ref 96–112)
CO2: 29 mEq/L (ref 19–32)
CREATININE: 1.38 mg/dL (ref 0.40–1.50)
Calcium: 8.8 mg/dL (ref 8.4–10.5)
GFR: 62.99 mL/min (ref >60.00)
GLUCOSE: 113 mg/dL — AB (ref 70–99)
POTASSIUM: 3.6 meq/L (ref 3.5–5.1)
SODIUM: 138 meq/L (ref 135–145)
TOTAL PROTEIN: 7.7 g/dL (ref 6.0–8.3)
Total Bilirubin: 0.6 mg/dL (ref 0.2–1.2)

## 2016-02-21 LAB — LIPID PANEL
CHOL/HDL RATIO: 3
Cholesterol: 172 mg/dL (ref 0–200)
HDL: 55.9 mg/dL (ref >39.00)
LDL Cholesterol: 87 mg/dL (ref 0–99)
NONHDL: 116.52
Triglycerides: 149 mg/dL (ref 0.0–149.0)
VLDL: 29.8 mg/dL (ref 0.0–40.0)

## 2016-02-21 LAB — PSA, MEDICARE: PSA: 0.01 ng/mL — AB (ref 0.10–4.00)

## 2016-02-21 MED ORDER — AMLODIPINE-ATORVASTATIN 10-10 MG PO TABS: 1.0000 | ORAL_TABLET | Freq: Every day | ORAL | 3 refills | Status: DC

## 2016-02-21 MED ORDER — METOPROLOL SUCCINATE ER 50 MG PO TB24: ORAL_TABLET | ORAL | 3 refills | Status: DC

## 2016-02-21 NOTE — Assessment & Plan Note (Signed)
No ADE on med.  See notes on nonfasting labs.  Okay to continue statin, assuming no sig changes on labs.  He agrees.

## 2016-02-21 NOTE — Assessment & Plan Note (Signed)
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  As long as he is feeling well, tolerating iron, and not having overt sx, then it is likely not a good idea for more invasive testing, at the risk may be >> benefit.  Continue iron, check labs today.  He agrees.

## 2016-02-21 NOTE — Progress Notes (Signed)
Pre visit review using our clinic review tool, if applicable. No additional management support is needed unless otherwise documented below in the visit note. 

## 2016-02-21 NOTE — Assessment & Plan Note (Signed)
Reasonable to continue as is.  No change in meds.  See notes on labs.

## 2016-02-21 NOTE — Progress Notes (Signed)
Hypertension:    Using medication without problems or lightheadedness: yes Chest pain with exertion:no Edema:no Short of breath:no Average home BPs: not checked  Elevated Cholesterol: Using medications without problems:yes Muscle aches: no Diet compliance:encouarged Exercise: as tolerated.  D/w pt.  Main walking.    Advance directive d/w pt.  Wife designated if patient were incapacitated.    H/o IDA.  Prev CBC with anemia responding to iron noted.  Prev seen by GI.  Due for labs. Still on iron.  No FCNAVD.  On PPI.   Flu shot done at pharmacy today.    Meds, vitals, and allergies reviewed.   PMH and SH reviewed  ROS: Per HPI unless specifically indicated in ROS section   GEN: nad, alert and oriented HEENT: mucous membranes moist NECK: supple w/o LA CV: rrr. PULM: ctab, no inc wob ABD: soft, +bs EXT: no edema

## 2016-02-21 NOTE — Assessment & Plan Note (Signed)
D/w pt.  Reasonable to recheck PSA to make sure it is still low. He agrees.

## 2016-02-21 NOTE — Patient Instructions (Signed)
Go to the lab on the way out.  We'll contact you with your lab report. Take care.  Glad to see you.  Update me as needed.   

## 2016-02-22 ENCOUNTER — Encounter: Payer: Self-pay | Admitting: *Deleted

## 2016-03-06 ENCOUNTER — Encounter: Payer: Self-pay | Admitting: Family Medicine

## 2016-05-19 DIAGNOSIS — Z8546 Personal history of malignant neoplasm of prostate: Secondary | ICD-10-CM | POA: Diagnosis not present

## 2016-05-20 ENCOUNTER — Telehealth: Payer: Self-pay | Admitting: Family Medicine

## 2016-05-20 NOTE — Telephone Encounter (Signed)
Pt dropped off [employer] form to be filled out by pcp. Please call when ready for p/u. I placed in Rx tower.

## 2016-05-23 NOTE — Telephone Encounter (Signed)
I will work on the hardcopy.  Thanks. 

## 2016-05-25 NOTE — Telephone Encounter (Addendum)
I looked back at this. I need extra information. What was the specific service related issue that the patient wanted me to address with the paperwork? Please let me know.  My question is not dispute anything being service related, I just need extra detail. Thanks.

## 2016-05-26 NOTE — Telephone Encounter (Signed)
Patient called back and said there wasn't a specific problem.  He said he's just going to the [place] to get general care and sign up for the [place].

## 2016-05-26 NOTE — Telephone Encounter (Signed)
Left detailed message on voicemail to return call with info.

## 2016-05-27 NOTE — Telephone Encounter (Signed)
Form done. Thanks. 

## 2016-05-27 NOTE — Telephone Encounter (Signed)
Patient advised.  Form left at front desk for pick up. 

## 2016-06-26 ENCOUNTER — Ambulatory Visit: Payer: Medicare Other | Admitting: Family Medicine

## 2016-08-15 ENCOUNTER — Telehealth: Payer: Self-pay | Admitting: Family Medicine

## 2016-08-15 NOTE — Telephone Encounter (Signed)
Left pt message asking to call Allison back directly at [phone] to schedule AWV.+ labs with Lesia and CPE with PCP. °

## 2016-10-01 NOTE — Telephone Encounter (Signed)
Left pt message asking to call Allison back directly at [phone] to schedule AWV.+ labs with Lesia and CPE with PCP. °

## 2016-10-02 NOTE — Telephone Encounter (Signed)
Called pt back and LVM again

## 2016-10-14 ENCOUNTER — Other Ambulatory Visit: Payer: Self-pay | Admitting: Family Medicine

## 2016-10-14 DIAGNOSIS — D509 Iron deficiency anemia, unspecified: Secondary | ICD-10-CM

## 2016-10-14 DIAGNOSIS — I10 Essential (primary) hypertension: Secondary | ICD-10-CM

## 2016-10-15 ENCOUNTER — Ambulatory Visit (INDEPENDENT_AMBULATORY_CARE_PROVIDER_SITE_OTHER): Payer: Medicare Other

## 2016-10-15 VITALS — BP 136/80 | HR 72 | Temp 97.9°F | Ht 66.5 in | Wt 171.8 lb

## 2016-10-15 DIAGNOSIS — I10 Essential (primary) hypertension: Secondary | ICD-10-CM

## 2016-10-15 DIAGNOSIS — Z0000 Encounter for general adult medical examination without abnormal findings: Secondary | ICD-10-CM

## 2016-10-15 DIAGNOSIS — D509 Iron deficiency anemia, unspecified: Secondary | ICD-10-CM | POA: Diagnosis not present

## 2016-10-15 LAB — COMPREHENSIVE METABOLIC PANEL
ALBUMIN: 3.6 g/dL (ref 3.5–5.2)
ALK PHOS: 59 U/L (ref 39–117)
ALT: 14 U/L (ref 0–53)
AST: 20 U/L (ref 0–37)
BUN: 13 mg/dL (ref 6–23)
CO2: 28 mEq/L (ref 19–32)
CREATININE: 1.68 mg/dL — AB (ref 0.40–1.50)
Calcium: 9.1 mg/dL (ref 8.4–10.5)
Chloride: 102 mEq/L (ref 96–112)
GFR: 50.12 mL/min — ABNORMAL LOW (ref >60.00)
Glucose, Bld: 116 mg/dL — ABNORMAL HIGH (ref 70–99)
POTASSIUM: 3.8 meq/L (ref 3.5–5.1)
SODIUM: 135 meq/L (ref 135–145)
TOTAL PROTEIN: 7.4 g/dL (ref 6.0–8.3)
Total Bilirubin: 0.6 mg/dL (ref 0.2–1.2)

## 2016-10-15 LAB — LIPID PANEL
CHOLESTEROL: 149 mg/dL (ref 0–200)
HDL: 50.1 mg/dL (ref >39.00)
LDL CALC: 59 mg/dL (ref 0–99)
NonHDL: 98.96
TRIGLYCERIDES: 199 mg/dL — AB (ref 0.0–149.0)
Total CHOL/HDL Ratio: 3
VLDL: 39.8 mg/dL (ref 0.0–40.0)

## 2016-10-15 LAB — CBC WITH DIFFERENTIAL/PLATELET
Basophils Absolute: 0.1 10*3/uL (ref 0.0–0.1)
Basophils Relative: 0.6 % (ref 0.0–3.0)
EOS ABS: 0.1 10*3/uL (ref 0.0–0.7)
Eosinophils Relative: 0.9 % (ref 0.0–5.0)
HCT: 43.8 % (ref 39.0–52.0)
HEMOGLOBIN: 14.9 g/dL (ref 13.0–17.0)
Lymphocytes Relative: 10.4 % — ABNORMAL LOW (ref 12.0–46.0)
Lymphs Abs: 1 10*3/uL (ref 0.7–4.0)
MCHC: 33.9 g/dL (ref 30.0–36.0)
MCV: 92.5 fl (ref 78.0–100.0)
MONO ABS: 0.6 10*3/uL (ref 0.1–1.0)
Monocytes Relative: 6.3 % (ref 3.0–12.0)
Neutro Abs: 8 10*3/uL — ABNORMAL HIGH (ref 1.4–7.7)
Neutrophils Relative %: 81.8 % — ABNORMAL HIGH (ref 43.0–77.0)
Platelets: 176 10*3/uL (ref 150.0–400.0)
RBC: 4.73 Mil/uL (ref 4.22–5.81)
RDW: 13.8 % (ref 11.5–15.5)
WBC: 9.8 10*3/uL (ref 4.0–10.5)

## 2016-10-15 LAB — IBC PANEL
IRON: 78 ug/dL (ref 42–165)
SATURATION RATIOS: 27.7 % (ref 20.0–50.0)
TRANSFERRIN: 201 mg/dL — AB (ref 212.0–360.0)

## 2016-10-15 NOTE — Progress Notes (Signed)
PCP notes:   Health maintenance:  No gaps identified.   Abnormal screenings:   Hearing - failed Mini-Cog score: 18/20  Patient concerns:   None  Nurse concerns:  None  Next PCP appt:   10/22/16 @ 1500  I reviewed health advisor's note, was available for consultation on the day of service listed in this note, and agree with documentation and plan. Elsie Stain, MD.

## 2016-10-15 NOTE — Patient Instructions (Signed)
Rick [name] , Thank you for taking time to come for your Medicare Wellness Visit. I appreciate your ongoing commitment to your health goals. Please review the following plan we discussed and let me know if I can assist you in the future.   These are the goals we discussed: Goals    . Increase physical activity          Starting 10/15/2016, I will continue to work at least 60 min daily in garden as weather permits.        This is a list of the screening recommended for you and due dates:  Health Maintenance  Topic Date Due  . Flu Shot  01/07/2017  . Tetanus Vaccine  03/09/2018  . Pneumonia vaccines  Completed   Preventive Care for Adults  A healthy lifestyle and preventive care can promote health and wellness. Preventive health guidelines for adults include the following key practices.  . A routine yearly physical is a good way to check with your health care provider about your health and preventive screening. It is a chance to share any concerns and updates on your health and to receive a thorough exam.  . Visit your dentist for a routine exam and preventive care every 6 months. Brush your teeth twice a day and floss once a day. Good oral hygiene prevents tooth decay and gum disease.  . The frequency of eye exams is based on your age, health, family medical history, use  of contact lenses, and other factors. Follow your health care provider's ecommendations for frequency of eye exams.  . Eat a healthy diet. Foods like vegetables, fruits, whole grains, low-fat dairy products, and lean protein foods contain the nutrients you need without too many calories. Decrease your intake of foods high in solid fats, added sugars, and salt. Eat the right amount of calories for you. Get information about a proper diet from your health care provider, if necessary.  . Regular physical exercise is one of the most important things you can do for your health. Most adults should get at least 150 minutes of  moderate-intensity exercise (any activity that increases your heart rate and causes you to sweat) each week. In addition, most adults need muscle-strengthening exercises on 2 or more days a week.  Silver Sneakers may be a benefit available to you. To determine eligibility, you may visit the website: www.silversneakers.com or contact program at [phone] Mon-Fri between 8AM-8PM.   . Maintain a healthy weight. The body mass index (BMI) is a screening tool to identify possible weight problems. It provides an estimate of body fat based on height and weight. Your health care provider can find your BMI and can help you achieve or maintain a healthy weight.   For adults 20 years and older: ? A BMI below 18.5 is considered underweight. ? A BMI of 18.5 to 24.9 is normal. ? A BMI of 25 to 29.9 is considered overweight. ? A BMI of 30 and above is considered obese.   . Maintain normal blood lipids and cholesterol levels by exercising and minimizing your intake of saturated fat. Eat a balanced diet with plenty of fruit and vegetables. Blood tests for lipids and cholesterol should begin at age 39 and be repeated every 5 years. If your lipid or cholesterol levels are high, you are over 50, or you are at high risk for heart disease, you may need your cholesterol levels checked more frequently. Ongoing high lipid and cholesterol levels should be treated with medicines  if diet and exercise are not working.  . If you smoke, find out from your health care provider how to quit. If you do not use tobacco, please do not start.  . If you choose to drink alcohol, please do not consume more than 2 drinks per day. One drink is considered to be 12 ounces (355 mL) of beer, 5 ounces (148 mL) of wine, or 1.5 ounces (44 mL) of liquor.  . If you are 68-68 years old, ask your health care provider if you should take aspirin to prevent strokes.  . Use sunscreen. Apply sunscreen liberally and repeatedly throughout the day. You  should seek shade when your shadow is shorter than you. Protect yourself by wearing long sleeves, pants, a wide-brimmed hat, and sunglasses year round, whenever you are outdoors.  . Once a month, do a whole body skin exam, using a mirror to look at the skin on your back. Tell your health care provider of new moles, moles that have irregular borders, moles that are larger than a pencil eraser, or moles that have changed in shape or color.

## 2016-10-15 NOTE — Progress Notes (Signed)
Pre visit review using our clinic review tool, if applicable. No additional management support is needed unless otherwise documented below in the visit note. 

## 2016-10-15 NOTE — Progress Notes (Signed)
Subjective:   Rick [name] is a 83 y.o. male who presents for Medicare Annual/Subsequent preventive examination.  Review of Systems:  N/A Cardiac Risk Factors include: advanced age (>69men, >100 women);male gender;dyslipidemia;hypertension;smoking/ tobacco exposure     Objective:    Vitals: BP 136/80 (BP Location: Right Arm, Patient Position: Sitting, Cuff Size: Normal)   Pulse 72   Temp 97.9 F (36.6 C) (Oral)   Ht 5' 6.5" (1.689 m) Comment: no shoes  Wt 171 lb 12 oz (77.9 kg)   SpO2 95%   BMI 27.31 kg/m   Body mass index is 27.31 kg/m.  Tobacco History  Smoking Status  . Former Smoker  . Types: Cigarettes  . Quit date: 08/26/2001  Smokeless Tobacco  . Current User  . Types: Chew    Comment: quit about 10 years or more     Ready to quit: No Counseling given: No   Past Medical History:  Diagnosis Date  . Arthritis   . GERD (gastroesophageal reflux disease)   . History of colon polyps   . History of prostate cancer   . Hyperlipidemia   . Hypertension   . Kidney cysts    bilateral.  Renal US   Past Surgical History:  Procedure Laterality Date  . COLONOSCOPY    . CYSTOSCOPY  02/12/99   biopsy  . ESOPHAGEAL BANDING N/A 08/04/2012   Procedure: ESOPHAGEAL BANDING;  Surgeon: Inda Castle, MD;  Location: [facility];  Service: Endoscopy;  Laterality: N/A;  . ESOPHAGOGASTRODUODENOSCOPY N/A 08/04/2012   Procedure: ESOPHAGOGASTRODUODENOSCOPY (EGD);  Surgeon: Inda Castle, MD;  Location: [facility];  Service: Endoscopy;  Laterality: N/A;  . INGUINAL HERNIA REPAIR  02/1999   Dr. Reece Agar  . PROSTATECTOMY  02/1999   Family History  Problem Relation Age of Onset  . Bone cancer Sister   . Stomach cancer Brother   . Stomach cancer Brother   . Lung cancer Sister   . Colon cancer Sister   . Stroke Mother   . Colon cancer Other     nephew   History  Sexual Activity  . Sexual activity: Not on file    Outpatient Encounter Prescriptions as of 10/15/2016    Medication Sig  . amlodipine-atorvastatin (CADUET) 10-10 MG tablet Take 1 tablet by mouth daily.  . Ferrous Sulfate (IRON) 325 (65 FE) MG TABS Take 325 mg by mouth daily.  Marland Kitchen latanoprost (XALATAN) 0.005 % ophthalmic solution Place 1 drop into both eyes at bedtime.  . metoprolol succinate (TOPROL-XL) 50 MG 24 hr tablet TAKE 1 TABLET BY MOUTH EVERY DAY IMMEDIATELY FOLLWING A MEAL  . omeprazole (PRILOSEC) 20 MG capsule Take 20 mg by mouth daily.     No facility-administered encounter medications on file as of 10/15/2016.     Activities of Daily Living In your present state of health, do you have any difficulty performing the following activities: 10/15/2016  Hearing? N  Vision? N  Difficulty concentrating or making decisions? N  Walking or climbing stairs? N  Dressing or bathing? N  Doing errands, shopping? N  Preparing Food and eating ? N  Using the Toilet? N  In the past six months, have you accidently leaked urine? Y  Do you have problems with loss of bowel control? N  Managing your Medications? N  Managing your Finances? N  Housekeeping or managing your Housekeeping? N  Some recent data might be hidden    Patient Care Team: Tonia Ghent, MD as PCP - General (Family  Medicine) Ardis Hughs, MD as Attending Physician (Urology) Clent Jacks, MD as Consulting Physician (Ophthalmology)   Assessment:     Hearing Screening   125Hz  250Hz  500Hz  1000Hz  2000Hz  3000Hz  4000Hz  6000Hz  8000Hz   Right ear:   40 0 0  40    Left ear:   40 0 40  0    Vision Screening Comments: Last vision exam in December 2017 with Dr. Katy Fitch   Exercise Activities and Dietary recommendations Current Exercise Habits: The patient has a physically strenous job, but has no regular exercise apart from work. (pt works daily in farm), Exercise limited by: None identified  Goals    . Increase physical activity          Starting 10/15/2016, I will continue to work at least 60 min daily in garden as weather  permits.       Fall Risk Fall Risk  10/15/2016 01/12/2015  Falls in the past year? No Yes  Number falls in past yr: - 1   Depression Screen PHQ 2/9 Scores 10/15/2016 01/12/2015  PHQ - 2 Score 0 0    Cognitive Function MMSE - Mini Mental State Exam 10/15/2016  Orientation to time 5  Orientation to Place 5  Registration 3  Attention/ Calculation 0  Recall 1  Recall-comments pt was unable to recall 2 of 3 words  Language- name 2 objects 0  Language- repeat 1  Language- follow 3 step command 3  Language- read & follow direction 0  Write a sentence 0  Copy design 0  Total score 18     PLEASE NOTE: A Mini-Cog screen was completed. Maximum score is 20. A value of 0 denotes this part of Folstein MMSE was not completed or the patient failed this part of the Mini-Cog screening.   Mini-Cog Screening Orientation to Time - Max 5 pts Orientation to Place - Max 5 pts Registration - Max 3 pts Recall - Max 3 pts Language Repeat - Max 1 pts Language Follow 3 Step Command - Max 3 pts   Immunization History  Administered Date(s) Administered  . Influenza Split 04/23/2011  . Influenza Whole 06/18/2005, 03/03/2008  . Influenza,inj,Quad PF,36+ Mos 04/19/2013  . Influenza-Unspecified 02/21/2016  . Pneumococcal Conjugate-13 01/12/2015  . Pneumococcal Polysaccharide-23 06/09/2000  . Td 06/09/1994, 03/09/2008   Screening Tests Health Maintenance  Topic Date Due  . INFLUENZA VACCINE  01/07/2017  . TETANUS/TDAP  03/09/2018  . PNA vac Low Risk Adult  Completed      Plan:    I have personally reviewed and addressed the Medicare Annual Wellness questionnaire and have noted the following in the patient's chart:  A. Medical and social history B. Use of alcohol, tobacco or illicit drugs  C. Current medications and supplements D. Functional ability and status E.  Nutritional status F.  Physical activity G. Advance directives H. List of other physicians I.  Hospitalizations, surgeries, and ER  visits in previous 12 months J.  [place] to include hearing, vision, cognitive, depression L. Referrals and appointments - none  In addition, I have reviewed and discussed with patient certain preventive protocols, quality metrics, and best practice recommendations. A written personalized care plan for preventive services as well as general preventive health recommendations were provided to patient.  See attached scanned questionnaire for additional information.   Signed,   Lindell Noe, MHA, BS, LPN Health Coach

## 2016-10-22 ENCOUNTER — Encounter: Payer: Self-pay | Admitting: Family Medicine

## 2016-10-22 ENCOUNTER — Ambulatory Visit (INDEPENDENT_AMBULATORY_CARE_PROVIDER_SITE_OTHER): Payer: Medicare Other | Admitting: Family Medicine

## 2016-10-22 VITALS — BP 136/78 | Temp 98.3°F | Wt 173.0 lb

## 2016-10-22 DIAGNOSIS — I10 Essential (primary) hypertension: Secondary | ICD-10-CM | POA: Diagnosis not present

## 2016-10-22 DIAGNOSIS — R739 Hyperglycemia, unspecified: Secondary | ICD-10-CM

## 2016-10-22 DIAGNOSIS — D509 Iron deficiency anemia, unspecified: Secondary | ICD-10-CM

## 2016-10-22 DIAGNOSIS — Z8546 Personal history of malignant neoplasm of prostate: Secondary | ICD-10-CM

## 2016-10-22 DIAGNOSIS — Z0000 Encounter for general adult medical examination without abnormal findings: Secondary | ICD-10-CM

## 2016-10-22 DIAGNOSIS — Z125 Encounter for screening for malignant neoplasm of prostate: Secondary | ICD-10-CM

## 2016-10-22 DIAGNOSIS — E7800 Pure hypercholesterolemia, unspecified: Secondary | ICD-10-CM

## 2016-10-22 MED ORDER — AMLODIPINE-ATORVASTATIN 10-10 MG PO TABS: 1.0000 | ORAL_TABLET | Freq: Every day | ORAL | 3 refills | Status: DC

## 2016-10-22 MED ORDER — OMEPRAZOLE 20 MG PO CPDR: 20.0000 mg | DELAYED_RELEASE_CAPSULE | Freq: Every day | ORAL | Status: DC

## 2016-10-22 MED ORDER — METOPROLOL SUCCINATE ER 50 MG PO TB24: ORAL_TABLET | ORAL | 3 refills | Status: DC

## 2016-10-22 NOTE — Assessment & Plan Note (Signed)
D/w pt about tapering carbs- he drinks a few miller lites a day.  He can cut back some.

## 2016-10-22 NOTE — Assessment & Plan Note (Signed)
Abnormal screenings:  Hearing - failed, not bothersome enough to need hearing aids.   Mini-Cog score: 18/20.  Recheck memory today, oriented to year, month, day.  3/3 attention.  Can to math.  2/3 on recall, 3/3 with prompting.  No red flag events, no recent deficits.  Neither he nor I see sig evidence of problematic memory loss at this point.   He is trying quit all tobacco. D/w pt.   Advance directive- wife designated if patient were incapacitated.

## 2016-10-22 NOTE — Progress Notes (Signed)
Abnormal screenings:  Hearing - failed, not bothersome enough to need hearing aids.   Mini-Cog score: 18/20.  Recheck memory today, oriented to year, month, day.  3/3 attention.  Can to math.  2/3 on recall, 3/3 with prompting.  No red flag events, no recent deficits.  He is trying quit all tobacco. D/w pt.   Advance directive- wife designated if patient were incapacitated.   Hypertension:    Using medication without problems or lightheadedness: yes Chest pain with exertion:no Edema:occ mild BLE edema, better in the AM, worse at the end of the day Short of breath:no Labs d/w pt.   Elevated Cholesterol: Using medications without problems:yes Muscle aches: no Diet compliance: encouraged.  "I try to eat pretty good."   Exercise:encouraged, working around the house, walking.    H/o anemia.  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  As long as he is feeling well, tolerating iron, and not having overt sx, then it is likely not a good idea for more invasive testing, at the risk may be >> benefit- again d/w pt today.  No bleeding, no blood seen in stool.  Compliant with oral iron w/o ADE.    Meds, vitals, and allergies reviewed.   PMH and SH reviewed  ROS: Per HPI unless specifically indicated in ROS section   GEN: nad, alert and oriented HEENT: mucous membranes moist NECK: supple w/o LA CV: rrr. PULM: ctab, no inc wob ABD: soft, +bs EXT: no edema SKIN: no acute rash

## 2016-10-22 NOTE — Patient Instructions (Addendum)
Check with your insurance to see if they will cover the shingles shot. Recheck labs in about 6 months before a visit.  You don't need to fast for the labs.  Take care.  Glad to see you.

## 2016-10-22 NOTE — Assessment & Plan Note (Signed)
We can recheck PSA with next set of labs.  D/w pt. He agrees.

## 2016-10-22 NOTE — Assessment & Plan Note (Signed)
Reasonable control, still with eGFR ~50 even with slightly higher Cr.  He avoid nsaids.  Would continue as is.  D/w pt about labs.  He agrees.

## 2016-10-22 NOTE — Assessment & Plan Note (Signed)
As long as he is feeling well, tolerating iron, and not having overt sx, then it is likely not a good idea for more invasive testing, at the risk may be >> benefit- again d/w pt today.  No bleeding, no blood seen in stool.  Compliant with oral iron w/o ADE.  Continue as is.  Recheck labs in about 6 months.  Update me as needed.  He agrees.

## 2016-10-22 NOTE — Assessment & Plan Note (Signed)
Tolerating statin, d/w pt about diet and exercise.  No change in meds.

## 2017-04-22 ENCOUNTER — Other Ambulatory Visit (INDEPENDENT_AMBULATORY_CARE_PROVIDER_SITE_OTHER): Payer: Medicare Other

## 2017-04-22 DIAGNOSIS — D509 Iron deficiency anemia, unspecified: Secondary | ICD-10-CM | POA: Diagnosis not present

## 2017-04-22 DIAGNOSIS — Z125 Encounter for screening for malignant neoplasm of prostate: Secondary | ICD-10-CM

## 2017-04-22 LAB — IBC PANEL
IRON: 83 ug/dL (ref 42–165)
SATURATION RATIOS: 29.1 % (ref 20.0–50.0)
Transferrin: 204 mg/dL — ABNORMAL LOW (ref 212.0–360.0)

## 2017-04-22 LAB — CBC WITH DIFFERENTIAL/PLATELET
BASOS PCT: 0.9 % (ref 0.0–3.0)
Basophils Absolute: 0.1 10*3/uL (ref 0.0–0.1)
EOS PCT: 2.9 % (ref 0.0–5.0)
Eosinophils Absolute: 0.2 10*3/uL (ref 0.0–0.7)
HEMATOCRIT: 45.1 % (ref 39.0–52.0)
HEMOGLOBIN: 15.1 g/dL (ref 13.0–17.0)
LYMPHS PCT: 11.4 % — AB (ref 12.0–46.0)
Lymphs Abs: 0.9 10*3/uL (ref 0.7–4.0)
MCHC: 33.4 g/dL (ref 30.0–36.0)
MCV: 94.1 fl (ref 78.0–100.0)
Monocytes Absolute: 0.6 10*3/uL (ref 0.1–1.0)
Monocytes Relative: 8.3 % (ref 3.0–12.0)
NEUTROS ABS: 5.9 10*3/uL (ref 1.4–7.7)
Neutrophils Relative %: 76.5 % (ref 43.0–77.0)
RBC: 4.79 Mil/uL (ref 4.22–5.81)
RDW: 14.1 % (ref 11.5–15.5)
WBC: 7.7 10*3/uL (ref 4.0–10.5)

## 2017-04-22 LAB — BASIC METABOLIC PANEL
BUN: 14 mg/dL (ref 6–23)
CALCIUM: 8.9 mg/dL (ref 8.4–10.5)
CO2: 31 mEq/L (ref 19–32)
CREATININE: 1.59 mg/dL — AB (ref 0.40–1.50)
Chloride: 103 mEq/L (ref 96–112)
GFR: 53.34 mL/min — AB (ref >60.00)
GLUCOSE: 106 mg/dL — AB (ref 70–99)
Potassium: 3.9 mEq/L (ref 3.5–5.1)
Sodium: 140 mEq/L (ref 135–145)

## 2017-04-22 LAB — PSA, MEDICARE: PSA: 0 ng/ml — ABNORMAL LOW (ref 0.10–4.00)

## 2017-04-24 ENCOUNTER — Ambulatory Visit: Payer: Medicare Other | Admitting: Family Medicine

## 2017-05-04 ENCOUNTER — Encounter: Payer: Self-pay | Admitting: Family Medicine

## 2017-05-04 ENCOUNTER — Ambulatory Visit (INDEPENDENT_AMBULATORY_CARE_PROVIDER_SITE_OTHER): Payer: Medicare Other | Admitting: Family Medicine

## 2017-05-04 VITALS — BP 128/70 | HR 75 | Temp 98.4°F | Wt 172.8 lb

## 2017-05-04 DIAGNOSIS — Z8546 Personal history of malignant neoplasm of prostate: Secondary | ICD-10-CM | POA: Diagnosis not present

## 2017-05-04 DIAGNOSIS — D509 Iron deficiency anemia, unspecified: Secondary | ICD-10-CM | POA: Diagnosis not present

## 2017-05-04 DIAGNOSIS — I10 Essential (primary) hypertension: Secondary | ICD-10-CM

## 2017-05-04 NOTE — Progress Notes (Signed)
PSA still zero.  D/w pt.  No dysuria.  He has some occ leakage, at base and he is putting up with that.  "I'm used to it."  Hypertension:    Using medication without problems or lightheadedness: yes Chest pain with exertion:no Edema:no Short of breath:no Labs d/w pt.   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 As long as he is feeling well, tolerating iron, and not having overt sx, then it is likely not a good idea for more invasive testing, at the risk may be >>benefit- again d/w pt today.  No bleeding, no blood seen in stool.  Compliant with oral iron w/o ADE except black stools as expected from iron use.  Discussed labs.    He has f/u with eye clinic pending for 05/2017.    PMH and SH reviewed  ROS: Per HPI unless specifically indicated in ROS section   Meds, vitals, and allergies reviewed.   GEN: nad, alert and oriented HEENT: mucous membranes moist NECK: supple w/o LA CV: rrr. PULM: ctab, no inc wob ABD: soft, +bs EXT: no edema

## 2017-05-04 NOTE — Patient Instructions (Addendum)
Check with your insurance to see if they will cover the shingrix shot. Don't change your meds for now.  Keep taking the iron.  Update me as needed.  Medicare/lab visit with [facility] in about 6 months.  I'd like to see you a few days after you see [facility].  Take care.  Glad to see you.

## 2017-05-05 NOTE — Assessment & Plan Note (Signed)
PSA still zero.  D/w pt.  No dysuria.  He has some occ leakage, at base and he is putting up with that.  "I'm used to it."  Continue as is.   Will notify urology as Juluis Rainier.

## 2017-05-05 NOTE — Assessment & Plan Note (Signed)
Reasonable control, continue as is.  No ADE on med.  Labs d/w pt.  He agrees.  Recheck in about 6 months.

## 2017-05-05 NOTE — Assessment & Plan Note (Signed)
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 As long as he is feeling well, tolerating iron, and not having overt sx, then it is likely not a good idea for more invasive testing, at the risk may be >>benefit- again d/w pt today.  No bleeding, no blood seen in stool.  Compliant with oral iron w/o ADE except black stools as expected from iron use.  Discussed labs.  Continue as is.  He agrees.

## 2017-05-29 IMAGING — CT CT HEAD W/O CM
2 series · 16 of 30 positions shown, 19 images · non-contrast
Comparison: CT head June 03, 2006

CLINICAL DATA: Became dizzy and off balance, fell outside hitting
head on tree. No loss of consciousness. History of prostate cancer,
hypertension, hyperlipidemia.

EXAM:
CT HEAD WITHOUT CONTRAST
TECHNIQUE: Contiguous axial images were obtained from the base of the skull
through the vertex without intravenous contrast.

[Series 2: head w/o · axial · non-contrast · 0.45mm/px · z∈[-173,-63]mm · 9 of 28 slices shown, 12 images]
[im 3/28  brain]
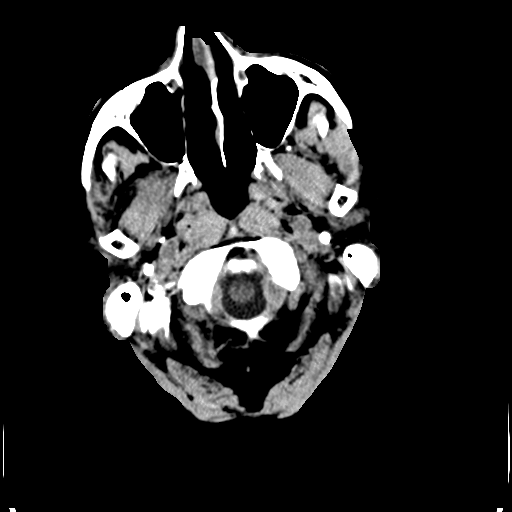
[im 3/28  bone]
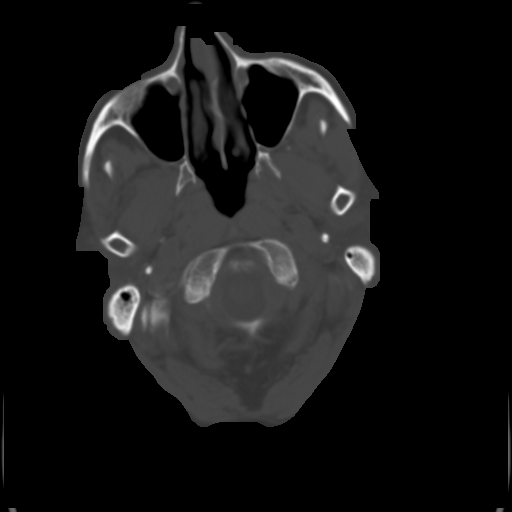
[im 6/28  brain]
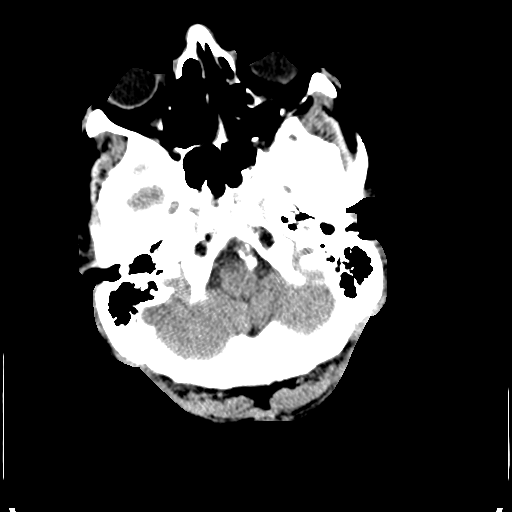
[im 9/28  brain]
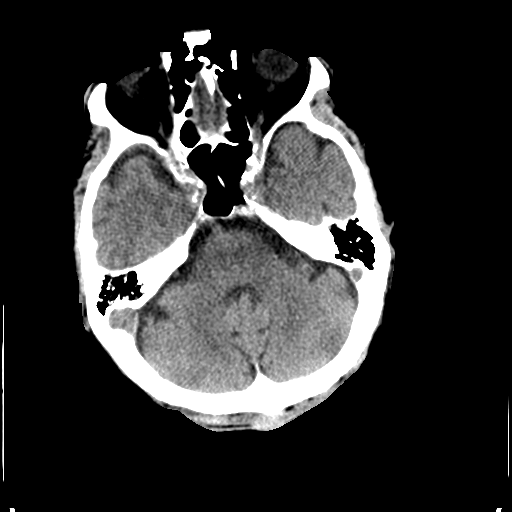
[im 11/28  brain]
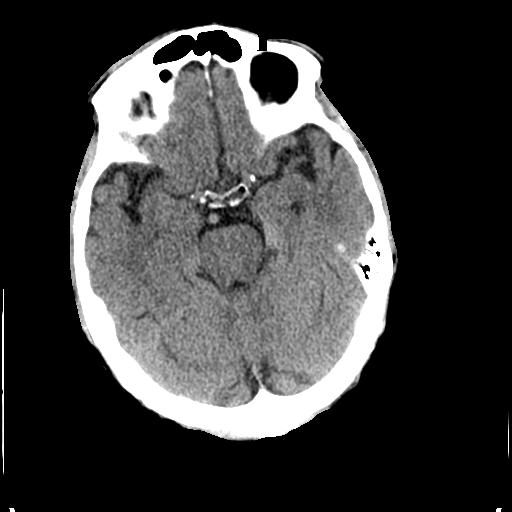
[im 14/28  brain]
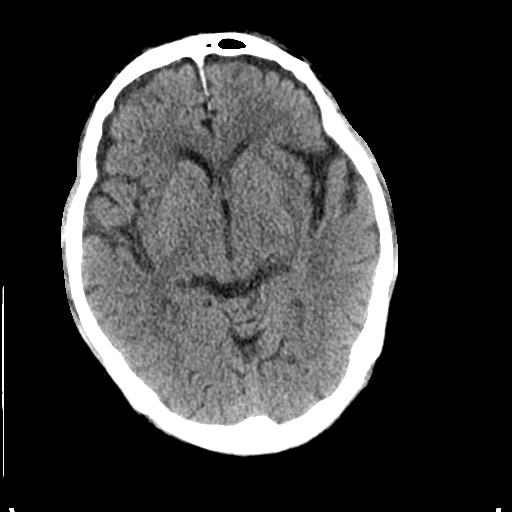
[im 14/28  bone]
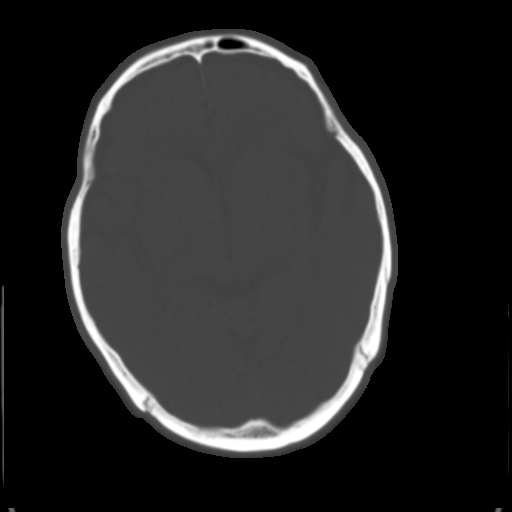
[im 17/28  brain]
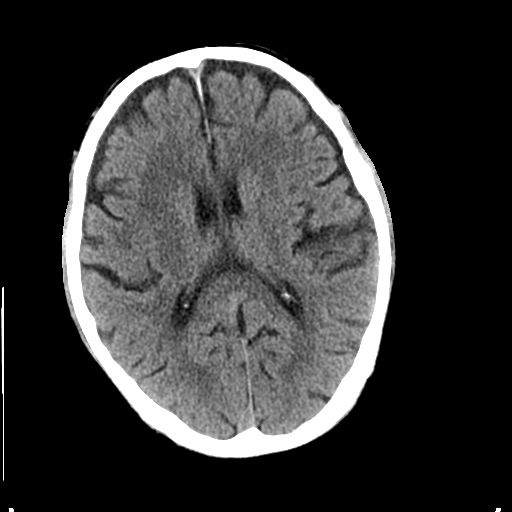
[im 19/28  brain]
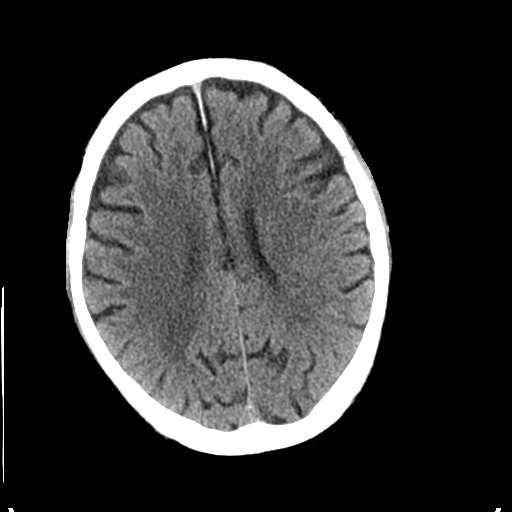
[im 22/28  brain]
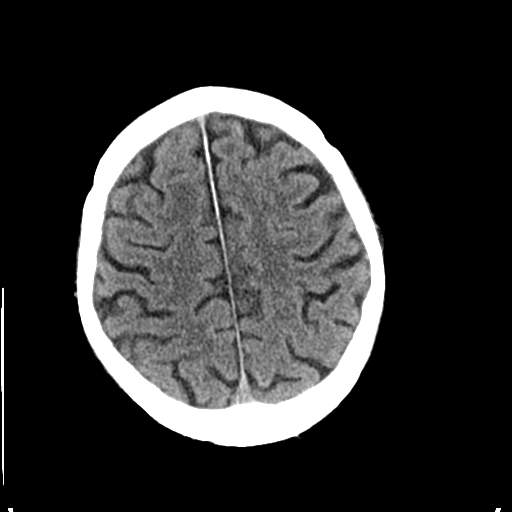
[im 25/28  brain]
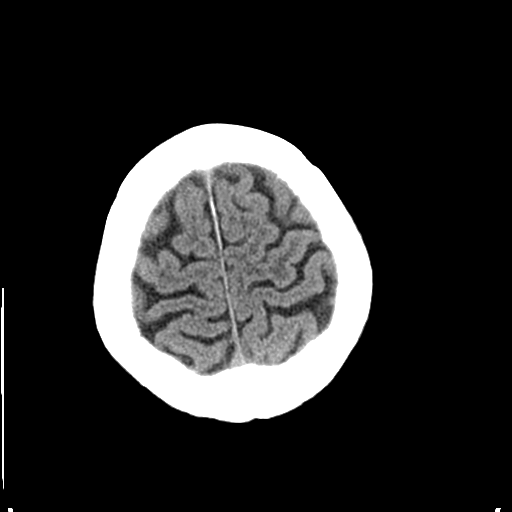
[im 25/28  bone]
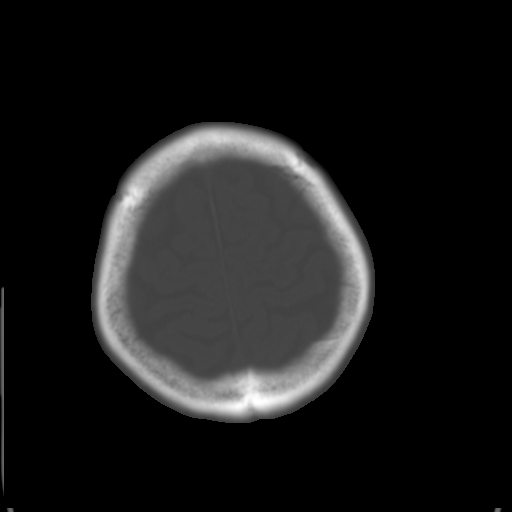

[Series 3: bone windows · axial · 0.42mm/px · z∈[-168,-69]mm · 7 of 50 slices shown]
[im 6/50  bone]
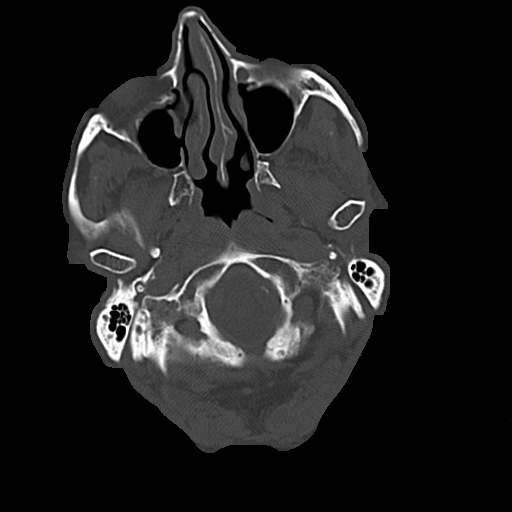
[im 11/50  bone]
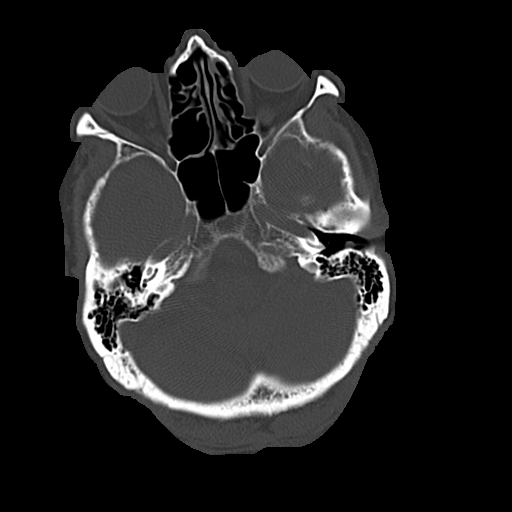
[im 17/50  bone]
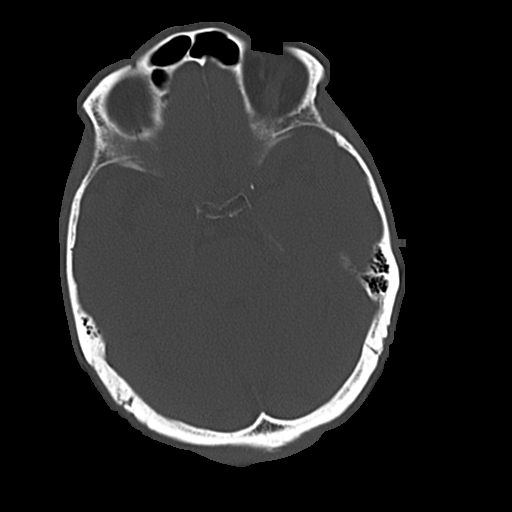
[im 22/50  bone]
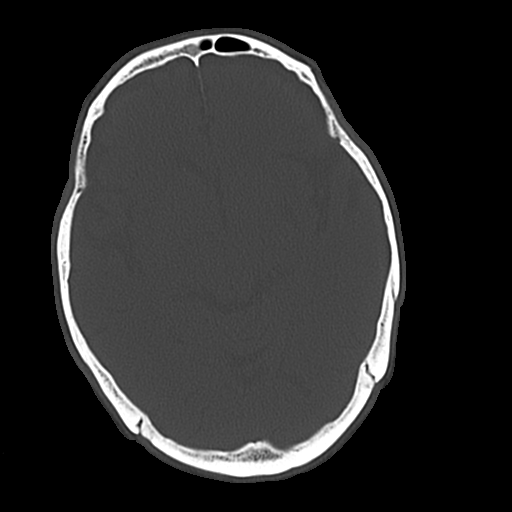
[im 28/50  bone]
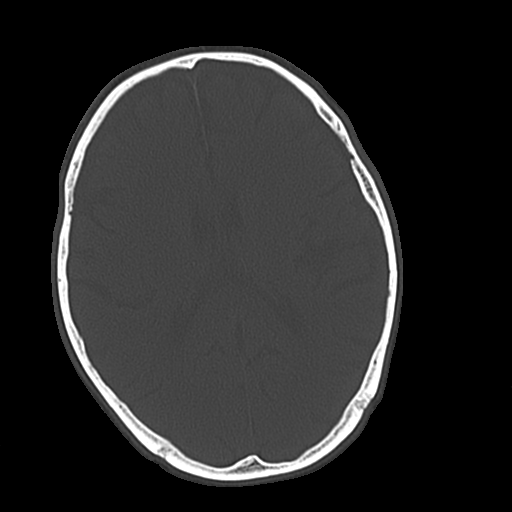
[im 33/50  bone]
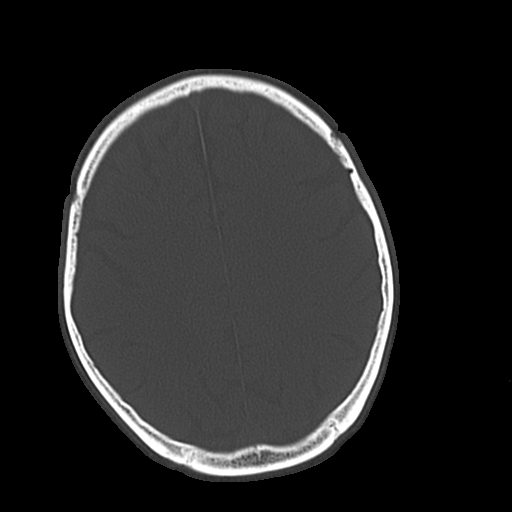
[im 39/50  bone]
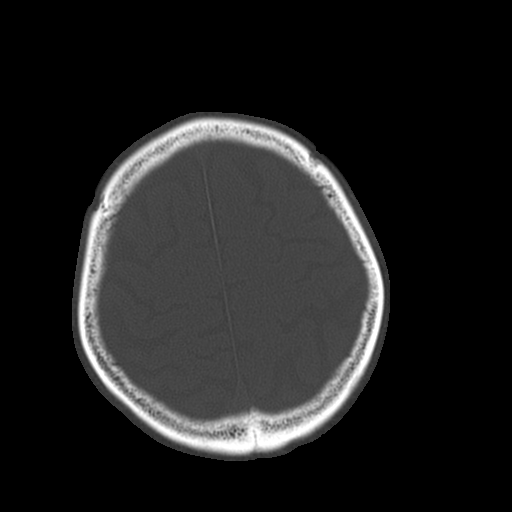

[16 of 30 positions shown; findings below may reference images not displayed]

FINDINGS: The ventricles and sulci are normal for age. No intraparenchymal
hemorrhage, mass effect nor midline shift. Patchy supratentorial
white matter hypodensities are less than expected for patient's age
and though non-specific suggest sequelae of chronic small vessel
ischemic disease. No acute large vascular territory infarcts.

No abnormal extra-axial fluid collections. Basal cisterns are
patent. Moderate calcific atherosclerosis of the carotid siphons and
included vertebral arteries.

No skull fracture. The included ocular globes and orbital contents
are non-suspicious. Status post bilateral ocular lens implants. Mild
paranasal sinus mucosal thickening without air-fluid levels. Mastoid
air cells are well aerated. Soft tissue within the external auditory
canals compatible with cerumen.
IMPRESSION: No acute intracranial process ; normal noncontrast CT head for age.

## 2017-10-09 ENCOUNTER — Other Ambulatory Visit: Payer: Self-pay | Admitting: Family Medicine

## 2017-10-16 ENCOUNTER — Ambulatory Visit: Payer: Medicare Other

## 2017-10-23 ENCOUNTER — Other Ambulatory Visit: Payer: Self-pay | Admitting: Family Medicine

## 2017-10-23 ENCOUNTER — Encounter: Payer: Self-pay | Admitting: Family Medicine

## 2017-10-23 ENCOUNTER — Ambulatory Visit (INDEPENDENT_AMBULATORY_CARE_PROVIDER_SITE_OTHER): Payer: Medicare Other

## 2017-10-23 ENCOUNTER — Ambulatory Visit (INDEPENDENT_AMBULATORY_CARE_PROVIDER_SITE_OTHER): Payer: Medicare Other | Admitting: Family Medicine

## 2017-10-23 VITALS — BP 140/84 | HR 69 | Temp 97.6°F | Ht 66.5 in | Wt 170.0 lb

## 2017-10-23 DIAGNOSIS — Z0000 Encounter for general adult medical examination without abnormal findings: Secondary | ICD-10-CM | POA: Diagnosis not present

## 2017-10-23 DIAGNOSIS — R05 Cough: Secondary | ICD-10-CM | POA: Diagnosis not present

## 2017-10-23 DIAGNOSIS — D509 Iron deficiency anemia, unspecified: Secondary | ICD-10-CM | POA: Diagnosis not present

## 2017-10-23 DIAGNOSIS — E7800 Pure hypercholesterolemia, unspecified: Secondary | ICD-10-CM

## 2017-10-23 DIAGNOSIS — Z7189 Other specified counseling: Secondary | ICD-10-CM

## 2017-10-23 DIAGNOSIS — I10 Essential (primary) hypertension: Secondary | ICD-10-CM | POA: Diagnosis not present

## 2017-10-23 DIAGNOSIS — Z125 Encounter for screening for malignant neoplasm of prostate: Secondary | ICD-10-CM

## 2017-10-23 DIAGNOSIS — R059 Cough, unspecified: Secondary | ICD-10-CM

## 2017-10-23 DIAGNOSIS — Z8546 Personal history of malignant neoplasm of prostate: Secondary | ICD-10-CM

## 2017-10-23 LAB — CBC WITH DIFFERENTIAL/PLATELET
BASOS PCT: 0.5 % (ref 0.0–3.0)
Basophils Absolute: 0 10*3/uL (ref 0.0–0.1)
EOS PCT: 3.1 % (ref 0.0–5.0)
Eosinophils Absolute: 0.3 10*3/uL (ref 0.0–0.7)
HEMATOCRIT: 39.6 % (ref 39.0–52.0)
HEMOGLOBIN: 13.6 g/dL (ref 13.0–17.0)
LYMPHS PCT: 8.4 % — AB (ref 12.0–46.0)
Lymphs Abs: 0.7 10*3/uL (ref 0.7–4.0)
MCHC: 34.5 g/dL (ref 30.0–36.0)
MCV: 89.6 fl (ref 78.0–100.0)
Monocytes Absolute: 0.9 10*3/uL (ref 0.1–1.0)
Monocytes Relative: 10.7 % (ref 3.0–12.0)
Neutro Abs: 6.6 10*3/uL (ref 1.4–7.7)
Neutrophils Relative %: 77.3 % — ABNORMAL HIGH (ref 43.0–77.0)
Platelets: 217 10*3/uL (ref 150.0–400.0)
RBC: 4.42 Mil/uL (ref 4.22–5.81)
RDW: 13.4 % (ref 11.5–15.5)
WBC: 8.5 10*3/uL (ref 4.0–10.5)

## 2017-10-23 LAB — LIPID PANEL
CHOL/HDL RATIO: 3
Cholesterol: 128 mg/dL (ref 0–200)
HDL: 51 mg/dL (ref >39.00)
LDL CALC: 63 mg/dL (ref 0–99)
NonHDL: 76.88
Triglycerides: 68 mg/dL (ref 0.0–149.0)
VLDL: 13.6 mg/dL (ref 0.0–40.0)

## 2017-10-23 LAB — COMPREHENSIVE METABOLIC PANEL
ALT: 11 U/L (ref 0–53)
AST: 16 U/L (ref 0–37)
Albumin: 3.4 g/dL — ABNORMAL LOW (ref 3.5–5.2)
Alkaline Phosphatase: 68 U/L (ref 39–117)
BUN: 13 mg/dL (ref 6–23)
CO2: 30 meq/L (ref 19–32)
Calcium: 8.8 mg/dL (ref 8.4–10.5)
Chloride: 102 mEq/L (ref 96–112)
Creatinine, Ser: 1.66 mg/dL — ABNORMAL HIGH (ref 0.40–1.50)
GFR: 50.69 mL/min — AB (ref >60.00)
Glucose, Bld: 100 mg/dL — ABNORMAL HIGH (ref 70–99)
POTASSIUM: 3.8 meq/L (ref 3.5–5.1)
SODIUM: 138 meq/L (ref 135–145)
Total Bilirubin: 0.5 mg/dL (ref 0.2–1.2)
Total Protein: 8.2 g/dL (ref 6.0–8.3)

## 2017-10-23 LAB — IBC PANEL
Iron: 20 ug/dL — ABNORMAL LOW (ref 42–165)
Saturation Ratios: 7.1 % — ABNORMAL LOW (ref 20.0–50.0)
Transferrin: 201 mg/dL — ABNORMAL LOW (ref 212.0–360.0)

## 2017-10-23 MED ORDER — METOPROLOL SUCCINATE ER 50 MG PO TB24: ORAL_TABLET | ORAL | 3 refills | Status: DC

## 2017-10-23 MED ORDER — AMLODIPINE-ATORVASTATIN 10-10 MG PO TABS: 1.0000 | ORAL_TABLET | Freq: Every day | ORAL | 3 refills | Status: DC

## 2017-10-23 NOTE — Patient Instructions (Signed)
Don't change your meds for now.   Let me talk to the GI clinic in the meantime.  Make sure you are still taking iron.   Update me as needed.  Take care.  Glad to see you.  Plan on a recheck in 6 months.

## 2017-10-23 NOTE — Progress Notes (Signed)
PCP notes:   Health maintenance:  No gaps identified.   Abnormal screenings:   Hearing - failed  Hearing Screening   125Hz  250Hz  500Hz  1000Hz  2000Hz  3000Hz  4000Hz  6000Hz  8000Hz   Right ear:   40 40 40  40    Left ear:   0 0 0  0     Patient concerns:   None  Nurse concerns:  None  Next PCP appt:   10/23/17 @ 1515  I reviewed health advisor's note, was available for consultation on the day of service listed in this note, and agree with documentation and plan. Elsie Stain, MD.

## 2017-10-23 NOTE — Patient Instructions (Addendum)
Mr. Route , Thank you for taking time to come for your Medicare Wellness Visit. I appreciate your ongoing commitment to your health goals. Please review the following plan we discussed and let me know if I can assist you in the future.   These are the goals we discussed: Goals    . Patient Stated     Starting 10/23/2017, I will continue to take medications as prescribed.        This is a list of the screening recommended for you and due dates:  Health Maintenance  Topic Date Due  . Flu Shot  01/07/2018  . Tetanus Vaccine  03/09/2018  . Pneumonia vaccines  Completed  . Preventive Care for Adults  A healthy lifestyle and preventive care can promote health and wellness. Preventive health guidelines for adults include the following key practices.  . A routine yearly physical is a good way to check with your health care provider about your health and preventive screening. It is a chance to share any concerns and updates on your health and to receive a thorough exam.  . Visit your dentist for a routine exam and preventive care every 6 months. Brush your teeth twice a day and floss once a day. Good oral hygiene prevents tooth decay and gum disease.  . The frequency of eye exams is based on your age, health, family medical history, use  of contact lenses, and other factors. Follow your health care provider's recommendations for frequency of eye exams.  . Eat a healthy diet. Foods like vegetables, fruits, whole grains, low-fat dairy products, and lean protein foods contain the nutrients you need without too many calories. Decrease your intake of foods high in solid fats, added sugars, and salt. Eat the right amount of calories for you. Get information about a proper diet from your health care provider, if necessary.  . Regular physical exercise is one of the most important things you can do for your health. Most adults should get at least 150 minutes of moderate-intensity exercise (any  activity that increases your heart rate and causes you to sweat) each week. In addition, most adults need muscle-strengthening exercises on 2 or more days a week.  Silver Sneakers may be a benefit available to you. To determine eligibility, you may visit the website: www.silversneakers.com or contact program at [phone] Mon-Fri between 8AM-8PM.   . Maintain a healthy weight. The body mass index (BMI) is a screening tool to identify possible weight problems. It provides an estimate of body fat based on height and weight. Your health care provider can find your BMI and can help you achieve or maintain a healthy weight.   For adults 20 years and older: ? A BMI below 18.5 is considered underweight. ? A BMI of 18.5 to 24.9 is normal. ? A BMI of 25 to 29.9 is considered overweight. ? A BMI of 30 and above is considered obese.   . Maintain normal blood lipids and cholesterol levels by exercising and minimizing your intake of saturated fat. Eat a balanced diet with plenty of fruit and vegetables. Blood tests for lipids and cholesterol should begin at age 29 and be repeated every 5 years. If your lipid or cholesterol levels are high, you are over 50, or you are at high risk for heart disease, you may need your cholesterol levels checked more frequently. Ongoing high lipid and cholesterol levels should be treated with medicines if diet and exercise are not working.  . If you smoke, find  out from your health care provider how to quit. If you do not use tobacco, please do not start.  . If you choose to drink alcohol, please do not consume more than 2 drinks per day. One drink is considered to be 12 ounces (355 mL) of beer, 5 ounces (148 mL) of wine, or 1.5 ounces (44 mL) of liquor.  . If you are 32-45 years old, ask your health care provider if you should take aspirin to prevent strokes.  . Use sunscreen. Apply sunscreen liberally and repeatedly throughout the day. You should seek shade when your  shadow is shorter than you. Protect yourself by wearing long sleeves, pants, a wide-brimmed hat, and sunglasses year round, whenever you are outdoors.  . Once a month, do a whole body skin exam, using a mirror to look at the skin on your back. Tell your health care provider of new moles, moles that have irregular borders, moles that are larger than a pencil eraser, or moles that have changed in shape or color.

## 2017-10-23 NOTE — Progress Notes (Signed)
Hearing screening failed.  Declined hearing aids.    Advance directive- wife designated if patient were incapacitated.   PSA wnl ~6 months ago.   D/w pt about recheck in about 6 months.    IDA. Iron level is lower but he reports still being on iron.  Black stools from iron at baseline.  No abd pain.  He doesn't have gross hematuria.  No other known blood loss.    Hypertension:    Using medication without problems or lightheadedness:  yes Chest pain with exertion:no Edema:occ trace BLE edema.   Short of breath:no  Elevated Cholesterol: Using medications without problems:yes Muscle aches: no Diet compliance: yes Exercise: active at baseline.    He had a cough attributed to allergies with season change.  Drinking water helps.  No sputum usually but occ.  No fevers.  He had used mucinex occ.    He has a 1 acre garden and he is still tending to all of that.  He is active at baseline.  PMH and SH reviewed  ROS: Per HPI unless specifically indicated in ROS section   Meds, vitals, and allergies reviewed.   GEN: nad, alert and oriented HEENT: mucous membranes moist NECK: supple w/o LA CV: rrr PULM: ctab, no inc wob ABD: soft, +bs EXT: no edema SKIN: no acute rash

## 2017-10-23 NOTE — Progress Notes (Signed)
Subjective:   Rick [name] is a 84 y.o. male who presents for Medicare Annual/Subsequent preventive examination.  Review of Systems:  N/A Cardiac Risk Factors include: advanced age (>53men, >59 women);dyslipidemia;hypertension;male gender;smoking/ tobacco exposure     Objective:    Vitals: BP 140/84 (BP Location: Right Arm, Patient Position: Sitting, Cuff Size: Normal)   Pulse 69   Temp 97.6 F (36.4 C) (Oral)   Ht 5' 6.5" (1.689 m) Comment: no shoes  Wt 170 lb (77.1 kg)   SpO2 93%   BMI 27.03 kg/m   Body mass index is 27.03 kg/m.  Advanced Directives 10/23/2017 10/15/2016 01/11/2015 01/11/2015 08/04/2012  Does Patient Have a Medical Advance Directive? No No No No Patient does not have advance directive  Would patient like information on creating a medical advance directive? Yes (MAU/Ambulatory/Procedural Areas - Information given) - No - patient declined information No - patient declined information -    Tobacco Social History   Tobacco Use  Smoking Status Former Smoker  . Types: Cigarettes  . Last attempt to quit: 08/26/2001  . Years since quitting: 16.1  Smokeless Tobacco Current User  . Types: Chew  Tobacco Comment   quit about 10 years or more     Ready to quit: No Counseling given: No Comment: quit about 10 years or more   Clinical Intake:  Pre-visit preparation completed: Yes  Pain : No/denies pain Pain Score: 0-No pain     Nutritional Status: BMI 25 -29 Overweight Nutritional Risks: None Diabetes: No  How often do you need to have someone help you when you read instructions, pamphlets, or other written materials from your doctor or pharmacy?: 1 - Never What is the last grade level you completed in school?: 12th grade  Interpreter Needed?: No  Comments: pt lives with spouse Information entered by :: LPinson, LPN  Past Medical History:  Diagnosis Date  . Arthritis   . GERD (gastroesophageal reflux disease)   . History of colon polyps   .  History of prostate cancer   . Hyperlipidemia   . Hypertension   . Kidney cysts    bilateral.  Renal US   Past Surgical History:  Procedure Laterality Date  . COLONOSCOPY    . CYSTOSCOPY  02/12/99   biopsy  . ESOPHAGEAL BANDING N/A 08/04/2012   Procedure: ESOPHAGEAL BANDING;  Surgeon: Inda Castle, MD;  Location: [facility];  Service: Endoscopy;  Laterality: N/A;  . ESOPHAGOGASTRODUODENOSCOPY N/A 08/04/2012   Procedure: ESOPHAGOGASTRODUODENOSCOPY (EGD);  Surgeon: Inda Castle, MD;  Location: [facility];  Service: Endoscopy;  Laterality: N/A;  . INGUINAL HERNIA REPAIR  02/1999   Dr. Reece Agar  . PROSTATECTOMY  02/1999   Family History  Problem Relation Age of Onset  . Bone cancer Sister   . Stomach cancer Brother   . Stomach cancer Brother   . Lung cancer Sister   . Colon cancer Sister   . Stroke Mother   . Colon cancer Other        nephew   Social History   Socioeconomic History  . Marital status: Married    Spouse name: Not on file  . Number of children: 6  . Years of education: Not on file  . Highest education level: Not on file  Occupational History  . Occupation: retired- Patent attorney, Biochemist, clinical    Comment: at [employer], Sport and exercise psychologist: [place]  . Financial resource strain: Not on file  .  Food insecurity:    Worry: Not on file    Inability: Not on file  . Transportation needs:    Medical: Not on file    Non-medical: Not on file  Tobacco Use  . Smoking status: Former Smoker    Types: Cigarettes    Last attempt to quit: 08/26/2001    Years since quitting: 16.1  . Smokeless tobacco: Current User    Types: Chew  . Tobacco comment: quit about 10 years or more  Substance and Sexual Activity  . Alcohol use: Yes    Alcohol/week: 3.6 - 4.2 oz    Types: 6 - 7 Cans of beer per week  . Drug use: No  . Sexual activity: Not Currently  Lifestyle  . Physical activity:    Days per week: Not on file     Minutes per session: Not on file  . Stress: Not on file  Relationships  . Social connections:    Talks on phone: Not on file    Gets together: Not on file    Attends religious service: Not on file    Active member of club or organization: Not on file    Attends meetings of clubs or organizations: Not on file    Relationship status: Not on file  Other Topics Concern  . 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      Outpatient Encounter Medications as of 10/23/2017  Medication Sig  . amlodipine-atorvastatin (CADUET) 10-10 MG tablet Take 1 tablet by mouth daily.  . Ferrous Sulfate (IRON) 325 (65 FE) MG TABS Take 325 mg by mouth daily.  Marland Kitchen latanoprost (XALATAN) 0.005 % ophthalmic solution Place 1 drop into both eyes at bedtime.  . metoprolol succinate (TOPROL-XL) 50 MG 24 hr tablet TAKE 1 TABLET BY MOUTH EVERY DAY IMMEDIATELY FOLLOWING A MEAL  . omeprazole (PRILOSEC) 20 MG capsule Take 1 capsule (20 mg total) by mouth daily.   No facility-administered encounter medications on file as of 10/23/2017.     Activities of Daily Living In your present state of health, do you have any difficulty performing the following activities: 10/23/2017  Hearing? N  Vision? N  Difficulty concentrating or making decisions? Y  Walking or climbing stairs? N  Dressing or bathing? N  Doing errands, shopping? Y  Comment drives short distances only  Conservation officer, nature and eating ? N  Using the Toilet? N  In the past six months, have you accidently leaked urine? N  Do you have problems with loss of bowel control? N  Managing your Medications? N  Managing your Finances? N  Housekeeping or managing your Housekeeping? N  Some recent data might be hidden    Patient Care Team: Tonia Ghent, MD as PCP - General (Family Medicine) Ardis Hughs, MD as  Attending Physician (Urology) Clent Jacks, MD as Consulting Physician (Ophthalmology)   Assessment:   This is a routine wellness examination for Rick [name].   Hearing Screening   125Hz  250Hz  500Hz  1000Hz  2000Hz  3000Hz  4000Hz  6000Hz  8000Hz   Right ear:   40 40 40  40    Left ear:   0 0 0  0    Vision Screening Comments: June 2018 with Dr. Katy Fitch   Exercise Activities and Dietary recommendations Current Exercise Habits: The patient has a physically strenous job, but has no  regular exercise apart from work.(gardening 2-3 hours daily, weather permitting), Exercise limited by: None identified  Goals    . Patient Stated     Starting 10/23/2017, I will continue to take medications as prescribed.        Fall Risk Fall Risk  10/23/2017 10/22/2016 10/15/2016 01/12/2015  Falls in the past year? No No No Yes  Number falls in past yr: - - - 1   Depression Screen PHQ 2/9 Scores 10/23/2017 10/22/2016 10/15/2016 01/12/2015  PHQ - 2 Score 0 0 0 0  PHQ- 9 Score 0 - - -    Cognitive Function MMSE - Mini Mental State Exam 10/23/2017 10/15/2016  Orientation to time 5 5  Orientation to Place 5 5  Registration 3 3  Attention/ Calculation 0 0  Recall 3 1  Recall-comments - pt was unable to recall 2 of 3 words  Language- name 2 objects 0 0  Language- repeat 1 1  Language- follow 3 step command 3 3  Language- read & follow direction 0 0  Write a sentence 0 0  Copy design 0 0  Total score 20 18     PLEASE NOTE: A Mini-Cog screen was completed. Maximum score is 20. A value of 0 denotes this part of Folstein MMSE was not completed or the patient failed this part of the Mini-Cog screening.   Mini-Cog Screening Orientation to Time - Max 5 pts Orientation to Place - Max 5 pts Registration - Max 3 pts Recall - Max 3 pts Language Repeat - Max 1 pts Language Follow 3 Step Command - Max 3 pts     Immunization History  Administered Date(s) Administered  . Influenza Split 04/23/2011  . Influenza Whole  06/18/2005, 03/03/2008  . Influenza, High Dose Seasonal PF 04/06/2017  . Influenza,inj,Quad PF,6+ Mos 04/19/2013  . Influenza-Unspecified 02/21/2016  . Pneumococcal Conjugate-13 01/12/2015  . Pneumococcal Polysaccharide-23 06/09/2000  . Td 06/09/1994, 03/09/2008    Screening Tests Health Maintenance  Topic Date Due  . INFLUENZA VACCINE  01/07/2018  . TETANUS/TDAP  03/09/2018  . PNA vac Low Risk Adult  Completed       Plan:     I have personally reviewed, addressed, and noted the following in the patient's chart:  A. Medical and social history B. Use of alcohol, tobacco or illicit drugs  C. Current medications and supplements D. Functional ability and status E.  Nutritional status F.  Physical activity G. Advance directives H. List of other physicians I.  Hospitalizations, surgeries, and ER visits in previous 12 months J.  Helen to include hearing, vision, cognitive, depression L. Referrals and appointments - none  In addition, I have reviewed and discussed with patient certain preventive protocols, quality metrics, and best practice recommendations. A written personalized care plan for preventive services as well as general preventive health recommendations were provided to patient.  See attached scanned questionnaire for additional information.   Signed,   Lindell Noe, MHA, BS, LPN Health Coach

## 2017-10-25 DIAGNOSIS — R05 Cough: Secondary | ICD-10-CM | POA: Insufficient documentation

## 2017-10-25 DIAGNOSIS — R059 Cough, unspecified: Secondary | ICD-10-CM | POA: Insufficient documentation

## 2017-10-25 NOTE — Assessment & Plan Note (Signed)
PSA wnl ~6 months ago.   D/w pt about recheck in about 6 months.   He agrees.

## 2017-10-25 NOTE — Assessment & Plan Note (Addendum)
Iron level is lower but he reports still being on iron.  Black stools from iron at baseline.  No abd pain.  He doesn't have gross hematuria.  No other known blood loss.    He is not on aspirin.  I want to talk to GI about options in the meantime.  I asked him to verify that he was still taking an adequate amount of iron at home.  See after visit summary.  >25 minutes spent in face to face time with patient, >50% spent in counselling or coordination of care, discussing iron deficiency, high blood pressure, hypercholesterolemia, etc.

## 2017-10-25 NOTE — Assessment & Plan Note (Signed)
Reasonable control.  No change in meds.  Labs discussed with patient.  He agrees.

## 2017-10-25 NOTE — Assessment & Plan Note (Signed)
Cough noted, but lungs are clear.  He attributed this to the seasonal change.  If his symptoms continue he can let me know.  Okay for outpatient follow-up.

## 2017-10-25 NOTE — Assessment & Plan Note (Signed)
Advance directive- wife designated if patient were incapacitated.  

## 2017-10-26 ENCOUNTER — Telehealth: Payer: Self-pay | Admitting: Family Medicine

## 2017-10-26 DIAGNOSIS — D509 Iron deficiency anemia, unspecified: Secondary | ICD-10-CM

## 2017-10-26 NOTE — Telephone Encounter (Signed)
No answer, will call again later.

## 2017-10-26 NOTE — Telephone Encounter (Signed)
Call pt.  I checked with Dr. Carlean Purl with GI.  At this point, since his HGB is still normal, I would continue as is with the iron.  I would recheck ferritin and HGB in 3 months.  If he isn't feeling well in the meantime, then let me know.  At this point, he is still active enough to work in his garden.  If that changes, then needs eval sooner.  I put in the orders.  All he needs in 3 months is the ferritin and CBC.   If his labs get sig worse, then we can consider other options (iron infusion, endoscopy) but he wouldn't need that done yet.   Thanks.

## 2017-10-27 NOTE — Telephone Encounter (Signed)
Copied from [place] [id number]. Topic: Quick Communication - Office Called Patient >> Oct 27, 2017  8:26 AM Yvette Rack wrote: Reason for CRM: patient calling office back Dr [name] assistant called her about GI Doctor

## 2017-10-27 NOTE — Telephone Encounter (Signed)
Patient advised.  Lab appt scheduled.  

## 2017-10-27 NOTE — Telephone Encounter (Signed)
Tried to phone patient at home and cell number, no answer on cell and no VM available.  Left another message on home phone.

## 2018-01-26 ENCOUNTER — Other Ambulatory Visit (INDEPENDENT_AMBULATORY_CARE_PROVIDER_SITE_OTHER): Payer: Medicare Other

## 2018-01-26 DIAGNOSIS — Z125 Encounter for screening for malignant neoplasm of prostate: Secondary | ICD-10-CM

## 2018-01-26 DIAGNOSIS — D509 Iron deficiency anemia, unspecified: Secondary | ICD-10-CM | POA: Diagnosis not present

## 2018-01-26 LAB — IBC PANEL
Iron: 46 ug/dL (ref 42–165)
SATURATION RATIOS: 16.8 % — AB (ref 20.0–50.0)
Transferrin: 195 mg/dL — ABNORMAL LOW (ref 212.0–360.0)

## 2018-01-26 LAB — BASIC METABOLIC PANEL
BUN: 14 mg/dL (ref 6–23)
CALCIUM: 9.1 mg/dL (ref 8.4–10.5)
CHLORIDE: 101 meq/L (ref 96–112)
CO2: 31 meq/L (ref 19–32)
CREATININE: 1.75 mg/dL — AB (ref 0.40–1.50)
GFR: 47.67 mL/min — ABNORMAL LOW (ref >60.00)
Glucose, Bld: 108 mg/dL — ABNORMAL HIGH (ref 70–99)
Potassium: 4.2 mEq/L (ref 3.5–5.1)
Sodium: 139 mEq/L (ref 135–145)

## 2018-01-26 LAB — CBC WITH DIFFERENTIAL/PLATELET
BASOS PCT: 0.5 % (ref 0.0–3.0)
Basophils Absolute: 0 10*3/uL (ref 0.0–0.1)
EOS ABS: 0.2 10*3/uL (ref 0.0–0.7)
Eosinophils Relative: 2.3 % (ref 0.0–5.0)
HEMATOCRIT: 41.8 % (ref 39.0–52.0)
Hemoglobin: 14.2 g/dL (ref 13.0–17.0)
LYMPHS PCT: 9.4 % — AB (ref 12.0–46.0)
Lymphs Abs: 0.8 10*3/uL (ref 0.7–4.0)
MCHC: 33.9 g/dL (ref 30.0–36.0)
MCV: 87.6 fl (ref 78.0–100.0)
Monocytes Absolute: 0.6 10*3/uL (ref 0.1–1.0)
Monocytes Relative: 7.3 % (ref 3.0–12.0)
NEUTROS ABS: 6.6 10*3/uL (ref 1.4–7.7)
Neutrophils Relative %: 80.5 % — ABNORMAL HIGH (ref 43.0–77.0)
PLATELETS: 201 10*3/uL (ref 150.0–400.0)
RBC: 4.77 Mil/uL (ref 4.22–5.81)
RDW: 15.6 % — AB (ref 11.5–15.5)
WBC: 8.2 10*3/uL (ref 4.0–10.5)

## 2018-01-26 LAB — FERRITIN: Ferritin: 67.7 ng/mL (ref 22.0–322.0)

## 2018-01-26 LAB — PSA, MEDICARE: PSA: 0 ng/mL — AB (ref 0.10–4.00)

## 2018-01-28 ENCOUNTER — Other Ambulatory Visit: Payer: Self-pay | Admitting: Family Medicine

## 2018-01-28 ENCOUNTER — Encounter: Payer: Self-pay | Admitting: *Deleted

## 2018-01-28 DIAGNOSIS — D509 Iron deficiency anemia, unspecified: Secondary | ICD-10-CM

## 2018-05-31 ENCOUNTER — Other Ambulatory Visit (INDEPENDENT_AMBULATORY_CARE_PROVIDER_SITE_OTHER): Payer: Medicare Other

## 2018-05-31 DIAGNOSIS — D509 Iron deficiency anemia, unspecified: Secondary | ICD-10-CM | POA: Diagnosis not present

## 2018-05-31 LAB — CBC WITH DIFFERENTIAL/PLATELET
BASOS ABS: 0 10*3/uL (ref 0.0–0.1)
BASOS PCT: 0.6 % (ref 0.0–3.0)
EOS ABS: 0.2 10*3/uL (ref 0.0–0.7)
Eosinophils Relative: 2.6 % (ref 0.0–5.0)
HEMATOCRIT: 42.9 % (ref 39.0–52.0)
HEMOGLOBIN: 14.8 g/dL (ref 13.0–17.0)
LYMPHS PCT: 10.6 % — AB (ref 12.0–46.0)
Lymphs Abs: 0.9 10*3/uL (ref 0.7–4.0)
MCHC: 34.6 g/dL (ref 30.0–36.0)
MCV: 89.7 fl (ref 78.0–100.0)
Monocytes Absolute: 0.8 10*3/uL (ref 0.1–1.0)
Monocytes Relative: 8.7 % (ref 3.0–12.0)
Neutro Abs: 6.8 10*3/uL (ref 1.4–7.7)
Neutrophils Relative %: 77.5 % — ABNORMAL HIGH (ref 43.0–77.0)
Platelets: 188 10*3/uL (ref 150.0–400.0)
RBC: 4.78 Mil/uL (ref 4.22–5.81)
RDW: 13.5 % (ref 11.5–15.5)
WBC: 8.7 10*3/uL (ref 4.0–10.5)

## 2018-05-31 LAB — FERRITIN: Ferritin: 90.3 ng/mL (ref 22.0–322.0)

## 2018-05-31 LAB — IBC PANEL
IRON: 39 ug/dL — AB (ref 42–165)
Saturation Ratios: 14.6 % — ABNORMAL LOW (ref 20.0–50.0)
Transferrin: 191 mg/dL — ABNORMAL LOW (ref 212.0–360.0)

## 2018-05-31 LAB — BASIC METABOLIC PANEL
BUN: 15 mg/dL (ref 6–23)
CHLORIDE: 101 meq/L (ref 96–112)
CO2: 27 meq/L (ref 19–32)
Calcium: 8.9 mg/dL (ref 8.4–10.5)
Creatinine, Ser: 1.57 mg/dL — ABNORMAL HIGH (ref 0.40–1.50)
GFR: 53.99 mL/min — ABNORMAL LOW (ref >60.00)
GLUCOSE: 106 mg/dL — AB (ref 70–99)
POTASSIUM: 3.6 meq/L (ref 3.5–5.1)
Sodium: 138 mEq/L (ref 135–145)

## 2018-06-01 ENCOUNTER — Other Ambulatory Visit: Payer: Self-pay | Admitting: Family Medicine

## 2018-06-01 DIAGNOSIS — D509 Iron deficiency anemia, unspecified: Secondary | ICD-10-CM

## 2018-09-03 ENCOUNTER — Other Ambulatory Visit: Payer: Self-pay

## 2018-09-03 ENCOUNTER — Other Ambulatory Visit (INDEPENDENT_AMBULATORY_CARE_PROVIDER_SITE_OTHER): Payer: Medicare Other

## 2018-09-03 DIAGNOSIS — D509 Iron deficiency anemia, unspecified: Secondary | ICD-10-CM

## 2018-09-03 LAB — CBC WITH DIFFERENTIAL/PLATELET
Basophils Absolute: 0 10*3/uL (ref 0.0–0.1)
Basophils Relative: 0.6 % (ref 0.0–3.0)
Eosinophils Absolute: 0.3 10*3/uL (ref 0.0–0.7)
Eosinophils Relative: 3.1 % (ref 0.0–5.0)
HCT: 41.6 % (ref 39.0–52.0)
Hemoglobin: 14.5 g/dL (ref 13.0–17.0)
Lymphocytes Relative: 10.8 % — ABNORMAL LOW (ref 12.0–46.0)
Lymphs Abs: 0.9 10*3/uL (ref 0.7–4.0)
MCHC: 34.9 g/dL (ref 30.0–36.0)
MCV: 89.4 fl (ref 78.0–100.0)
Monocytes Absolute: 0.6 10*3/uL (ref 0.1–1.0)
Monocytes Relative: 7 % (ref 3.0–12.0)
NEUTROS ABS: 6.5 10*3/uL (ref 1.4–7.7)
Neutrophils Relative %: 78.5 % — ABNORMAL HIGH (ref 43.0–77.0)
Platelets: 178 10*3/uL (ref 150.0–400.0)
RBC: 4.65 Mil/uL (ref 4.22–5.81)
RDW: 13.7 % (ref 11.5–15.5)
WBC: 8.3 10*3/uL (ref 4.0–10.5)

## 2018-09-03 LAB — IBC PANEL
Iron: 65 ug/dL (ref 42–165)
Saturation Ratios: 25 % (ref 20.0–50.0)
Transferrin: 186 mg/dL — ABNORMAL LOW (ref 212.0–360.0)

## 2018-09-03 LAB — BASIC METABOLIC PANEL
BUN: 15 mg/dL (ref 6–23)
CALCIUM: 8.6 mg/dL (ref 8.4–10.5)
CO2: 27 mEq/L (ref 19–32)
Chloride: 102 mEq/L (ref 96–112)
Creatinine, Ser: 1.7 mg/dL — ABNORMAL HIGH (ref 0.40–1.50)
GFR: 46.31 mL/min — ABNORMAL LOW (ref >60.00)
Glucose, Bld: 133 mg/dL — ABNORMAL HIGH (ref 70–99)
Potassium: 3.3 mEq/L — ABNORMAL LOW (ref 3.5–5.1)
Sodium: 138 mEq/L (ref 135–145)

## 2018-09-03 LAB — FERRITIN: Ferritin: 60.7 ng/mL (ref 22.0–322.0)

## 2018-11-02 ENCOUNTER — Other Ambulatory Visit: Payer: Self-pay | Admitting: Family Medicine

## 2018-11-11 ENCOUNTER — Other Ambulatory Visit (INDEPENDENT_AMBULATORY_CARE_PROVIDER_SITE_OTHER): Payer: Medicare Other

## 2018-11-11 ENCOUNTER — Ambulatory Visit (INDEPENDENT_AMBULATORY_CARE_PROVIDER_SITE_OTHER): Payer: Medicare Other

## 2018-11-11 ENCOUNTER — Other Ambulatory Visit: Payer: Self-pay | Admitting: Family Medicine

## 2018-11-11 ENCOUNTER — Encounter: Payer: Medicare Other | Admitting: Family Medicine

## 2018-11-11 ENCOUNTER — Other Ambulatory Visit: Payer: Self-pay

## 2018-11-11 DIAGNOSIS — Z8546 Personal history of malignant neoplasm of prostate: Secondary | ICD-10-CM

## 2018-11-11 DIAGNOSIS — D509 Iron deficiency anemia, unspecified: Secondary | ICD-10-CM

## 2018-11-11 DIAGNOSIS — Z0000 Encounter for general adult medical examination without abnormal findings: Secondary | ICD-10-CM

## 2018-11-11 DIAGNOSIS — I10 Essential (primary) hypertension: Secondary | ICD-10-CM | POA: Diagnosis not present

## 2018-11-11 LAB — LIPID PANEL
Cholesterol: 132 mg/dL (ref 0–200)
HDL: 49 mg/dL (ref >39.00)
LDL Cholesterol: 69 mg/dL (ref 0–99)
NonHDL: 82.63
Total CHOL/HDL Ratio: 3
Triglycerides: 69 mg/dL (ref 0.0–149.0)
VLDL: 13.8 mg/dL (ref 0.0–40.0)

## 2018-11-11 LAB — CBC WITH DIFFERENTIAL/PLATELET
Basophils Absolute: 0.1 10*3/uL (ref 0.0–0.1)
Basophils Relative: 0.6 % (ref 0.0–3.0)
Eosinophils Absolute: 0.2 10*3/uL (ref 0.0–0.7)
Eosinophils Relative: 2 % (ref 0.0–5.0)
HCT: 37.4 % — ABNORMAL LOW (ref 39.0–52.0)
Hemoglobin: 13 g/dL (ref 13.0–17.0)
Lymphocytes Relative: 7.3 % — ABNORMAL LOW (ref 12.0–46.0)
Lymphs Abs: 0.7 10*3/uL (ref 0.7–4.0)
MCHC: 34.7 g/dL (ref 30.0–36.0)
MCV: 89.4 fl (ref 78.0–100.0)
Monocytes Absolute: 0.7 10*3/uL (ref 0.1–1.0)
Monocytes Relative: 8 % (ref 3.0–12.0)
Neutro Abs: 7.5 10*3/uL (ref 1.4–7.7)
Neutrophils Relative %: 82.1 % — ABNORMAL HIGH (ref 43.0–77.0)
Platelets: 201 10*3/uL (ref 150.0–400.0)
RBC: 4.19 Mil/uL — ABNORMAL LOW (ref 4.22–5.81)
RDW: 14 % (ref 11.5–15.5)
WBC: 9.1 10*3/uL (ref 4.0–10.5)

## 2018-11-11 LAB — COMPREHENSIVE METABOLIC PANEL
ALT: 10 U/L (ref 0–53)
AST: 13 U/L (ref 0–37)
Albumin: 3.2 g/dL — ABNORMAL LOW (ref 3.5–5.2)
Alkaline Phosphatase: 62 U/L (ref 39–117)
BUN: 17 mg/dL (ref 6–23)
CO2: 28 mEq/L (ref 19–32)
Calcium: 8.6 mg/dL (ref 8.4–10.5)
Chloride: 103 mEq/L (ref 96–112)
Creatinine, Ser: 2.03 mg/dL — ABNORMAL HIGH (ref 0.40–1.50)
GFR: 37.72 mL/min — ABNORMAL LOW (ref >60.00)
Glucose, Bld: 90 mg/dL (ref 70–99)
Potassium: 3.9 mEq/L (ref 3.5–5.1)
Sodium: 140 mEq/L (ref 135–145)
Total Bilirubin: 0.8 mg/dL (ref 0.2–1.2)
Total Protein: 7.1 g/dL (ref 6.0–8.3)

## 2018-11-11 LAB — FERRITIN: Ferritin: 77 ng/mL (ref 22.0–322.0)

## 2018-11-11 LAB — IRON: Iron: 50 ug/dL (ref 42–165)

## 2018-11-11 LAB — PSA, MEDICARE: PSA: 0 ng/ml — ABNORMAL LOW (ref 0.10–4.00)

## 2018-11-11 NOTE — Progress Notes (Signed)
PCP notes:   Health maintenance:  Tetanus vaccine - postponed/insurance  Abnormal screenings:   Fall risk - hx of multiple falls Fall Risk  11/11/2018 10/23/2017 10/22/2016 10/15/2016 01/12/2015  Falls in the past year? 2 No No No Yes  Comment falls due to loss of balance - - - -  Number falls in past yr: 2 - - - 1  Injury with Fall? 0 - - - -  Risk for fall due to : History of fall(s);Impaired balance/gait - - - -    Patient concerns:   None  Nurse concerns:  None  Next PCP appt:   11/12/18 @ 1200  I reviewed health advisor's note, was available for consultation on the day of service listed in this note, and agree with documentation and plan. Elsie Stain, MD.

## 2018-11-11 NOTE — Progress Notes (Signed)
Subjective:   Rick [name] is a 85 y.o. male who presents for Medicare Annual/Subsequent preventive examination.  Review of Systems:  N/A Cardiac Risk Factors include: advanced age (>2men, >38 women);dyslipidemia;hypertension;male gender;smoking/ tobacco exposure     Objective:    Vitals: There were no vitals taken for this visit.  There is no height or weight on file to calculate BMI.  Advanced Directives 11/11/2018 10/23/2017 10/15/2016 01/11/2015 01/11/2015 08/04/2012  Does Patient Have a Medical Advance Directive? No No No No No Patient does not have advance directive  Would patient like information on creating a medical advance directive? No - Patient declined Yes (MAU/Ambulatory/Procedural Areas - Information given) - No - patient declined information No - patient declined information -    Tobacco Social History   Tobacco Use  Smoking Status Former Smoker  . Types: Cigarettes  . Last attempt to quit: 08/26/2001  . Years since quitting: 17.2  Smokeless Tobacco Current User  . Types: Chew  Tobacco Comment   quit about 10 years or more     Ready to quit: Not Answered Counseling given: No Comment: quit about 10 years or more   Clinical Intake:  Pre-visit preparation completed: Yes  Pain : No/denies pain Pain Score: 0-No pain     Nutritional Status: BMI 25 -29 Overweight Nutritional Risks: None Diabetes: No  How often do you need to have someone help you when you read instructions, pamphlets, or other written materials from your doctor or pharmacy?: 1 - Never What is the last grade level you completed in school?: 12th grade  Interpreter Needed?: No  Comments: pt lives with spouse Information entered by :: LPinson, LPN  Past Medical History:  Diagnosis Date  . Arthritis   . GERD (gastroesophageal reflux disease)   . History of colon polyps   . History of prostate cancer   . Hyperlipidemia   . Hypertension   . Kidney cysts    bilateral.  Renal US   Past  Surgical History:  Procedure Laterality Date  . COLONOSCOPY    . CYSTOSCOPY  02/12/99   biopsy  . ESOPHAGEAL BANDING N/A 08/04/2012   Procedure: ESOPHAGEAL BANDING;  Surgeon: Inda Castle, MD;  Location: [facility];  Service: Endoscopy;  Laterality: N/A;  . ESOPHAGOGASTRODUODENOSCOPY N/A 08/04/2012   Procedure: ESOPHAGOGASTRODUODENOSCOPY (EGD);  Surgeon: Inda Castle, MD;  Location: [facility];  Service: Endoscopy;  Laterality: N/A;  . INGUINAL HERNIA REPAIR  02/1999   Dr. Reece Agar  . PROSTATECTOMY  02/1999   Family History  Problem Relation Age of Onset  . Bone cancer Sister   . Stomach cancer Brother   . Stomach cancer Brother   . Lung cancer Sister   . Colon cancer Sister   . Stroke Mother   . Colon cancer Other        nephew   Social History   Socioeconomic History  . Marital status: Married    Spouse name: Not on file  . Number of children: 6  . Years of education: Not on file  . Highest education level: Not on file  Occupational History  . Occupation: retired- Patent attorney, Biochemist, clinical    Comment: at [employer], Sport and exercise psychologist: [place]  . Financial resource strain: Not on file  . Food insecurity:    Worry: Not on file    Inability: Not on file  . Transportation needs:    Medical: Not on file  Non-medical: Not on file  Tobacco Use  . Smoking status: Former Smoker    Types: Cigarettes    Last attempt to quit: 08/26/2001    Years since quitting: 17.2  . Smokeless tobacco: Current User    Types: Chew  . Tobacco comment: quit about 10 years or more  Substance and Sexual Activity  . Alcohol use: Yes    Alcohol/week: 7.0 standard drinks    Types: 7 Cans of beer per week  . Drug use: No  . Sexual activity: Not Currently  Lifestyle  . Physical activity:    Days per week: Not on file    Minutes per session: Not on file  . Stress: Not on file  Relationships  . Social connections:    Talks on phone: Not  on file    Gets together: Not on file    Attends religious service: Not on file    Active member of club or organization: Not on file    Attends meetings of clubs or organizations: Not on file    Relationship status: Not on file  Other Topics Concern  . 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      Outpatient Encounter Medications as of 11/11/2018  Medication Sig  . amlodipine-atorvastatin (CADUET) 10-10 MG tablet TAKE 1 TABLET BY MOUTH EVERY DAY  . Ferrous Sulfate (IRON) 325 (65 FE) MG TABS Take 325 mg by mouth daily.  Marland Kitchen latanoprost (XALATAN) 0.005 % ophthalmic solution Place 1 drop into both eyes at bedtime.  . metoprolol succinate (TOPROL-XL) 50 MG 24 hr tablet TAKE 1 TABLET BY MOUTH EVERY DAY IMMEDIATELY FOLLOWING A MEAL  . omeprazole (PRILOSEC) 20 MG capsule Take 1 capsule (20 mg total) by mouth daily.   No facility-administered encounter medications on file as of 11/11/2018.     Activities of Daily Living In your present state of health, do you have any difficulty performing the following activities: 11/11/2018  Hearing? Y  Vision? N  Difficulty concentrating or making decisions? N  Walking or climbing stairs? N  Dressing or bathing? N  Doing errands, shopping? N  Preparing Food and eating ? N  Using the Toilet? N  In the past six months, have you accidently leaked urine? N  Do you have problems with loss of bowel control? N  Managing your Medications? N  Managing your Finances? N  Housekeeping or managing your Housekeeping? N  Some recent data might be hidden    Patient Care Team: Tonia Ghent, MD as PCP - General (Family Medicine) Ardis Hughs, MD as Attending Physician (Urology) Clent Jacks, MD as Consulting Physician (Ophthalmology)   Assessment:   This is a routine wellness examination for [place].  Vision  Screening Comments: Vision exam every 6 mths with Dr. 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 Patient Stated     Starting 11/11/2018, I will continue to take medications as prescribed.        Fall Risk Fall Risk  11/11/2018 10/23/2017 10/22/2016 10/15/2016 01/12/2015  Falls in the past year? 1 No No No Yes  Comment falls due to loss of balance - - - -  Number falls  in past yr: 1 - - - 1  Injury with Fall? 0 - - - -  Risk for fall due to : History of fall(s);Impaired balance/gait - - - -   Depression Screen PHQ 2/9 Scores 11/11/2018 10/23/2017 10/22/2016 10/15/2016  PHQ - 2 Score 0 0 0 0  PHQ- 9 Score 0 0 - -    Cognitive Function MMSE - Mini Mental State Exam 11/11/2018 10/23/2017 10/15/2016  Orientation to time 5 5 5   Orientation to Place 5 5 5   Registration 3 3 3   Attention/ Calculation 0 0 0  Recall 3 3 1   Recall-comments - - pt was unable to recall 2 of 3 words  Language- name 2 objects 0 0 0  Language- repeat 1 1 1   Language- follow 3 step command 0 3 3  Language- read & follow direction 0 0 0  Write a sentence 0 0 0  Copy design 0 0 0  Total score 17 20 18      PLEASE NOTE: A Mini-Cog screen was completed. Maximum score is 17. A value of 0 denotes this part of Folstein MMSE was not completed or the patient failed this part of the Mini-Cog screening.   Mini-Cog Screening Orientation to Time - Max 5 pts Orientation to Place - Max 5 pts Registration - Max 3 pts Recall - Max 3 pts Language Repeat - Max 1 pts      Immunization History  Administered Date(s) Administered  . Influenza Split 04/23/2011  . Influenza Whole 06/18/2005, 03/03/2008  . Influenza, High Dose Seasonal PF 04/06/2017  . Influenza,inj,Quad PF,6+ Mos 04/19/2013  . Influenza-Unspecified 02/21/2016  . Pneumococcal  Conjugate-13 01/12/2015  . Pneumococcal Polysaccharide-23 06/09/2000  . Td 06/09/1994, 03/09/2008    Screening Tests Health Maintenance  Topic Date Due  . TETANUS/TDAP  06/08/2020 (Originally 03/09/2018)  . INFLUENZA VACCINE  01/08/2019  . PNA vac Low Risk Adult  Completed     Plan:     I have personally reviewed, addressed, and noted the following in the patient's chart:  A. Medical and social history B. Use of alcohol, tobacco or illicit drugs  C. Current medications and supplements D. Functional ability and status E.  Nutritional status F.  Physical activity G. Advance directives H. List of other physicians I.  Hospitalizations, surgeries, and ER visits in previous 12 months J.  Vitals (unless it is a telemedicine encounter) K. Screenings to include cognitive, depression, hearing, vision (NOTE: hearing and vision screenings not completed in telemedicine encounter) L. Referrals and appointments   In addition, I have reviewed and discussed with patient certain preventive protocols, quality metrics, and best practice recommendations. A written personalized care plan for preventive services and recommendations were provided to patient.  With patient's permission, we connected on 11/11/18 at 10:00 AM EDT. Interactive audio and video telecommunications were attempted with patient. This attempt was unsuccessful due to patient having technical difficulties OR patient did not have access to video capability.  Encounter was completed with audio only.  Two patient identifiers were used to ensure the encounter occurred with the correct person. Patient was in home and writer was in office.   Signed,   Lindell Noe, MHA, BS, LPN Health Coach

## 2018-11-11 NOTE — Patient Instructions (Signed)
Rick [name] , Thank you for taking time to come for your Medicare Wellness Visit. I appreciate your ongoing commitment to your health goals. Please review the following plan we discussed and let me know if I can assist you in the future.   These are the goals we discussed: Goals    . Patient Stated     Starting 11/11/2018, I will continue to take medications as prescribed.        This is a list of the screening recommended for you and due dates:  Health Maintenance  Topic Date Due  . Tetanus Vaccine  03/09/2018  . Flu Shot  01/08/2019  . Pneumonia vaccines  Completed   Preventive Care for Adults  A healthy lifestyle and preventive care can promote health and wellness. Preventive health guidelines for adults include the following key practices.  . A routine yearly physical is a good way to check with your health care provider about your health and preventive screening. It is a chance to share any concerns and updates on your health and to receive a thorough exam.  . Visit your dentist for a routine exam and preventive care every 6 months. Brush your teeth twice a day and floss once a day. Good oral hygiene prevents tooth decay and gum disease.  . The frequency of eye exams is based on your age, health, family medical history, use  of contact lenses, and other factors. Follow your health care provider's recommendations for frequency of eye exams.  . Eat a healthy diet. Foods like vegetables, fruits, whole grains, low-fat dairy products, and lean protein foods contain the nutrients you need without too many calories. Decrease your intake of foods high in solid fats, added sugars, and salt. Eat the right amount of calories for you. Get information about a proper diet from your health care provider, if necessary.  . Regular physical exercise is one of the most important things you can do for your health. Most adults should get at least 150 minutes of moderate-intensity exercise (any activity  that increases your heart rate and causes you to sweat) each week. In addition, most adults need muscle-strengthening exercises on 2 or more days a week.  Silver Sneakers may be a benefit available to you. To determine eligibility, you may visit the website: www.silversneakers.com or contact program at [phone] Mon-Fri between 8AM-8PM.   . Maintain a healthy weight. The body mass index (BMI) is a screening tool to identify possible weight problems. It provides an estimate of body fat based on height and weight. Your health care provider can find your BMI and can help you achieve or maintain a healthy weight.   For adults 20 years and older: ? A BMI below 18.5 is considered underweight. ? A BMI of 18.5 to 24.9 is normal. ? A BMI of 25 to 29.9 is considered overweight. ? A BMI of 30 and above is considered obese.   . Maintain normal blood lipids and cholesterol levels by exercising and minimizing your intake of saturated fat. Eat a balanced diet with plenty of fruit and vegetables. Blood tests for lipids and cholesterol should begin at age 25 and be repeated every 5 years. If your lipid or cholesterol levels are high, you are over 50, or you are at high risk for heart disease, you may need your cholesterol levels checked more frequently. Ongoing high lipid and cholesterol levels should be treated with medicines if diet and exercise are not working.  . If you smoke, find  out from your health care provider how to quit. If you do not use tobacco, please do not start.  . If you choose to drink alcohol, please do not consume more than 2 drinks per day. One drink is considered to be 12 ounces (355 mL) of beer, 5 ounces (148 mL) of wine, or 1.5 ounces (44 mL) of liquor.  . If you are 43-81 years old, ask your health care provider if you should take aspirin to prevent strokes.  . Use sunscreen. Apply sunscreen liberally and repeatedly throughout the day. You should seek shade when your shadow is  shorter than you. Protect yourself by wearing long sleeves, pants, a wide-brimmed hat, and sunglasses year round, whenever you are outdoors.  . Once a month, do a whole body skin exam, using a mirror to look at the skin on your back. Tell your health care provider of new moles, moles that have irregular borders, moles that are larger than a pencil eraser, or moles that have changed in shape or color.

## 2018-11-12 ENCOUNTER — Telehealth: Payer: Self-pay | Admitting: Family Medicine

## 2018-11-12 ENCOUNTER — Ambulatory Visit: Payer: Medicare Other | Admitting: Family Medicine

## 2018-11-12 NOTE — Telephone Encounter (Signed)
Patient's wife advised, [name].

## 2018-11-12 NOTE — Telephone Encounter (Signed)
Patient's wife called and said they had received several calls and she thought it was about patient's lab work. I let her know patient had a phone appointment with Dr.Duncan at 12:00. I rescheduled the appointment to 11/16/18.  I didn't see this note until I rescheduled the appointment, so I didn't tell her to make sure patient was drinking water.  I cancelled the appointment for today.

## 2018-11-12 NOTE — Telephone Encounter (Signed)
Thanks for checking on patient.  Please give him the message below.  His Cr was up from prev and we'll need to address/recheck that in the near future.   Make sure he is drinking enough water to keep his urine clear and avoid nsaids.  Thanks.

## 2018-11-12 NOTE — Telephone Encounter (Signed)
Was scheduled for f/u today.  Called mult times, couldn't get in touch with patient. Please check on patient, reschedule for early next week if possible.  His Cr was up from prev and we'll need to address/recheck that in the near future.   Make sure he is drinking enough water to keep his urine clear and avoid nsaids.  Thanks.

## 2018-11-12 NOTE — Progress Notes (Signed)
Called pt, couldn't leave message on voice mail.  A. Hopkins prev tried call pt twice today, w/o answer by patient.

## 2018-11-14 NOTE — Assessment & Plan Note (Signed)
Called pt, couldn't leave message on voice mail.  A. Hopkins prev tried call pt twice today, w/o answer by patient.

## 2018-11-16 ENCOUNTER — Ambulatory Visit (INDEPENDENT_AMBULATORY_CARE_PROVIDER_SITE_OTHER): Payer: Medicare Other | Admitting: Family Medicine

## 2018-11-16 ENCOUNTER — Encounter: Payer: Self-pay | Admitting: Family Medicine

## 2018-11-16 VITALS — Temp 97.5°F

## 2018-11-16 DIAGNOSIS — R7989 Other specified abnormal findings of blood chemistry: Secondary | ICD-10-CM

## 2018-11-16 DIAGNOSIS — Z0000 Encounter for general adult medical examination without abnormal findings: Secondary | ICD-10-CM | POA: Diagnosis not present

## 2018-11-16 DIAGNOSIS — Z7189 Other specified counseling: Secondary | ICD-10-CM

## 2018-11-16 DIAGNOSIS — I10 Essential (primary) hypertension: Secondary | ICD-10-CM

## 2018-11-16 DIAGNOSIS — D509 Iron deficiency anemia, unspecified: Secondary | ICD-10-CM

## 2018-11-16 DIAGNOSIS — E7800 Pure hypercholesterolemia, unspecified: Secondary | ICD-10-CM

## 2018-11-16 MED ORDER — AMLODIPINE-ATORVASTATIN 10-10 MG PO TABS: 1.0000 | ORAL_TABLET | Freq: Every day | ORAL | 3 refills | Status: DC

## 2018-11-16 MED ORDER — METOPROLOL SUCCINATE ER 50 MG PO TB24: ORAL_TABLET | ORAL | 3 refills | Status: DC

## 2018-11-16 NOTE — Progress Notes (Signed)
Interactive audio and video telecommunications were attempted between this provider and patient, however failed, due to patient having technical difficulties OR patient did not have access to video capability.  We continued and completed visit with audio only.   Virtual Visit via Telephone Note  I connected with patient on 11/16/18 at 12:33 PM by telephone and verified that I am speaking with the correct person using two identifiers.  Location of patient: home.   Location of MD: [facility] Name of referring provider (if blank then none associated): Names per persons and role in encounter:  MD: Earlyne Iba, Patient: name listed above.    I discussed the limitations, risks, security and privacy concerns of performing an evaluation and management service by telephone and the availability of in person appointments. I also discussed with the patient that there may be a patient responsible charge related to this service. The patient expressed understanding and agreed to proceed.  CC f/u   History of Present Illness:   Advance directive- wife designated if patient were incapacitated.  PSA 0. D/w pt.   Colonoscopy not due given his age, d/w pt.  PNA up to date Flu shot prev done.  tetanus 2020 at the [place].  Shingles d/w pt.    Fall caution d/w pt. He is intentionally working to careful.    Cr elevation.  He isn't taking aleve or ibuprofen.  nsaid cautions d/w pt. He has h/o renal cysts.    Hypertension:    Using medication without problems or lightheadedness: yes Chest pain with exertion:no Edema: some occ RLE edema, not consistent   Short of breath: he has some dec in exercise capacity that he attributed to relative deconditioning due to age and restrictions.    Elevated Cholesterol: Using medications without problems:yes Muscle aches: no Diet compliance: encouraged   Exercise: encouraged as tolerated.    Iron def anemia.  Still on iron.  No ADE on med.  Likely reasonable to  continue for now and defer colonoscopy.  Not passing blood.    Past medical history, social history, family history reviewed.  Meds, vitals, and allergies reviewed.   ROS: Per HPI unless specifically indicated in ROS section   Observations/Objective: nad Speech normal.  Assessment and Plan: Advance directive- wife designated if patient were incapacitated.  PSA 0. D/w pt.   Colonoscopy not due given his age, d/w pt.  PNA up to date Flu shot prev done.  tetanus 2020 at the [place].  Shingles d/w pt.    Fall caution d/w pt. He is intentionally working to careful with walking.   Cr elevation.  He isn't taking aleve or ibuprofen.  nsaid cautions d/w pt. He has h/o renal cysts.    Needs lab visit at 9:25 on 11/17/2018.  Bmet in [place].    Hypertension:    No change in meds at this point.  See notes on follow-up labs.  Elevated Cholesterol: No change in meds at this point.  Labs discussed with patient.  Iron def anemia.  Still on iron.  No ADE on med.  Likely reasonable/safer to continue for now and defer colonoscopy.  Not passing blood.  He agrees.   Follow Up Instructions: See above.  I discussed the assessment and treatment plan with the patient. The patient was provided an opportunity to ask questions and all were answered. The patient agreed with the plan and demonstrated an understanding of the instructions.   The patient was advised to call back or seek an  in-person evaluation if the symptoms worsen or if the condition fails to improve as anticipated.  I provided 25 minutes of non-face-to-face time during this encounter.  Elsie Stain, MD

## 2018-11-17 ENCOUNTER — Other Ambulatory Visit (INDEPENDENT_AMBULATORY_CARE_PROVIDER_SITE_OTHER): Payer: Medicare Other

## 2018-11-17 DIAGNOSIS — IMO0002 Reserved for concepts with insufficient information to code with codable children: Secondary | ICD-10-CM | POA: Insufficient documentation

## 2018-11-17 DIAGNOSIS — R7989 Other specified abnormal findings of blood chemistry: Secondary | ICD-10-CM | POA: Diagnosis not present

## 2018-11-17 LAB — BASIC METABOLIC PANEL
BUN: 14 mg/dL (ref 6–23)
CO2: 28 mEq/L (ref 19–32)
Calcium: 8.6 mg/dL (ref 8.4–10.5)
Chloride: 102 mEq/L (ref 96–112)
Creatinine, Ser: 1.87 mg/dL — ABNORMAL HIGH (ref 0.40–1.50)
GFR: 41.47 mL/min — ABNORMAL LOW (ref >60.00)
Glucose, Bld: 92 mg/dL (ref 70–99)
Potassium: 4.1 mEq/L (ref 3.5–5.1)
Sodium: 137 mEq/L (ref 135–145)

## 2018-11-17 NOTE — Assessment & Plan Note (Signed)
Iron def anemia.  Still on iron.  No ADE on med.  Likely reasonable/safer to continue for now and defer colonoscopy.  Not passing blood.  He agrees.

## 2018-11-17 NOTE — Assessment & Plan Note (Signed)
Advance directive- wife designated if patient were incapacitated.  PSA 0. D/w pt.   Colonoscopy not due given his age, d/w pt.  PNA up to date Flu shot prev done.  tetanus 2020 at the [place].  Shingles d/w pt.

## 2018-11-17 NOTE — Assessment & Plan Note (Signed)
No change in meds at this point.  See notes on follow-up labs.

## 2018-11-17 NOTE — Assessment & Plan Note (Signed)
No change in meds at this point.  Labs discussed with patient. 

## 2018-11-17 NOTE — Assessment & Plan Note (Signed)
Cr elevation.  He isn't taking aleve or ibuprofen.  nsaid cautions d/w pt. He has h/o renal cysts.    Needs lab visit at 9:25 on 11/17/2018.  [facility] in [place].

## 2018-11-17 NOTE — Assessment & Plan Note (Signed)
Advance directive- wife designated if patient were incapacitated.  

## 2019-11-15 ENCOUNTER — Other Ambulatory Visit: Payer: Self-pay | Admitting: *Deleted

## 2019-11-15 ENCOUNTER — Other Ambulatory Visit: Payer: Self-pay | Admitting: Family Medicine

## 2019-11-15 MED ORDER — METOPROLOL SUCCINATE ER 50 MG PO TB24: ORAL_TABLET | ORAL | 0 refills | Status: DC

## 2019-12-01 DIAGNOSIS — H401131 Primary open-angle glaucoma, bilateral, mild stage: Secondary | ICD-10-CM | POA: Diagnosis not present

## 2019-12-03 ENCOUNTER — Other Ambulatory Visit: Payer: Self-pay | Admitting: Family Medicine

## 2020-02-10 ENCOUNTER — Other Ambulatory Visit: Payer: Self-pay | Admitting: Family Medicine

## 2020-03-01 ENCOUNTER — Other Ambulatory Visit: Payer: Self-pay | Admitting: Family Medicine

## 2020-03-01 NOTE — Telephone Encounter (Signed)
Electronic refill request. Amlodipine-Atorvastatin Last office visit:   11/16/2018 Last Filled:    90 tablet 0 12/03/2019  Patient was given notice at last RF to schedule OV.

## 2020-03-04 NOTE — Telephone Encounter (Signed)
Sent. Thanks.  With noticed to schedule appointment put on prescription.

## 2020-03-22 ENCOUNTER — Other Ambulatory Visit: Payer: Self-pay | Admitting: Family Medicine

## 2020-03-26 ENCOUNTER — Other Ambulatory Visit: Payer: Self-pay | Admitting: Family Medicine

## 2020-03-26 ENCOUNTER — Encounter: Payer: Self-pay | Admitting: Family Medicine

## 2020-05-08 ENCOUNTER — Other Ambulatory Visit: Payer: Self-pay | Admitting: Family Medicine

## 2020-05-09 ENCOUNTER — Other Ambulatory Visit: Payer: Self-pay | Admitting: Family Medicine

## 2020-05-09 DIAGNOSIS — Z125 Encounter for screening for malignant neoplasm of prostate: Secondary | ICD-10-CM

## 2020-05-09 DIAGNOSIS — E7800 Pure hypercholesterolemia, unspecified: Secondary | ICD-10-CM

## 2020-05-09 DIAGNOSIS — I10 Essential (primary) hypertension: Secondary | ICD-10-CM

## 2020-05-09 DIAGNOSIS — D509 Iron deficiency anemia, unspecified: Secondary | ICD-10-CM

## 2020-05-10 ENCOUNTER — Other Ambulatory Visit: Payer: Self-pay | Admitting: Family Medicine

## 2020-05-10 DIAGNOSIS — D509 Iron deficiency anemia, unspecified: Secondary | ICD-10-CM

## 2020-05-18 ENCOUNTER — Other Ambulatory Visit: Payer: Medicare Other

## 2020-05-18 ENCOUNTER — Other Ambulatory Visit: Payer: Self-pay

## 2020-05-18 ENCOUNTER — Other Ambulatory Visit (INDEPENDENT_AMBULATORY_CARE_PROVIDER_SITE_OTHER): Payer: Medicare PPO

## 2020-05-18 DIAGNOSIS — Z125 Encounter for screening for malignant neoplasm of prostate: Secondary | ICD-10-CM

## 2020-05-18 DIAGNOSIS — I10 Essential (primary) hypertension: Secondary | ICD-10-CM | POA: Diagnosis not present

## 2020-05-18 DIAGNOSIS — D509 Iron deficiency anemia, unspecified: Secondary | ICD-10-CM | POA: Diagnosis not present

## 2020-05-18 DIAGNOSIS — E7800 Pure hypercholesterolemia, unspecified: Secondary | ICD-10-CM

## 2020-05-18 LAB — COMPREHENSIVE METABOLIC PANEL WITH GFR
ALT: 12 U/L (ref 0–53)
AST: 22 U/L (ref 0–37)
Albumin: 3.4 g/dL — ABNORMAL LOW (ref 3.5–5.2)
Alkaline Phosphatase: 83 U/L (ref 39–117)
BUN: 16 mg/dL (ref 6–23)
CO2: 28 meq/L (ref 19–32)
Calcium: 8.7 mg/dL (ref 8.4–10.5)
Chloride: 100 meq/L (ref 96–112)
Creatinine, Ser: 2.21 mg/dL — ABNORMAL HIGH (ref 0.40–1.50)
GFR: 25.82 mL/min — ABNORMAL LOW
Glucose, Bld: 87 mg/dL (ref 70–99)
Potassium: 3.9 meq/L (ref 3.5–5.1)
Sodium: 135 meq/L (ref 135–145)
Total Bilirubin: 0.8 mg/dL (ref 0.2–1.2)
Total Protein: 7.7 g/dL (ref 6.0–8.3)

## 2020-05-18 LAB — LIPID PANEL
Cholesterol: 132 mg/dL (ref 0–200)
HDL: 61.6 mg/dL (ref >39.00)
LDL Cholesterol: 56 mg/dL (ref 0–99)
NonHDL: 70.69
Total CHOL/HDL Ratio: 2
Triglycerides: 73 mg/dL (ref 0.0–149.0)
VLDL: 14.6 mg/dL (ref 0.0–40.0)

## 2020-05-18 LAB — CBC WITH DIFFERENTIAL/PLATELET
Basophils Absolute: 0.1 10*3/uL (ref 0.0–0.1)
Basophils Relative: 0.7 % (ref 0.0–3.0)
Eosinophils Absolute: 0.3 10*3/uL (ref 0.0–0.7)
Eosinophils Relative: 3.6 % (ref 0.0–5.0)
HCT: 38.4 % — ABNORMAL LOW (ref 39.0–52.0)
Hemoglobin: 13.1 g/dL (ref 13.0–17.0)
Lymphocytes Relative: 9.9 % — ABNORMAL LOW (ref 12.0–46.0)
Lymphs Abs: 0.8 10*3/uL (ref 0.7–4.0)
MCHC: 34.3 g/dL (ref 30.0–36.0)
MCV: 90.8 fl (ref 78.0–100.0)
Monocytes Absolute: 0.7 10*3/uL (ref 0.1–1.0)
Monocytes Relative: 8.3 % (ref 3.0–12.0)
Neutro Abs: 6.3 10*3/uL (ref 1.4–7.7)
Neutrophils Relative %: 77.5 % — ABNORMAL HIGH (ref 43.0–77.0)
Platelets: 196 10*3/uL (ref 150.0–400.0)
RBC: 4.22 Mil/uL (ref 4.22–5.81)
RDW: 14.6 % (ref 11.5–15.5)
WBC: 8.2 10*3/uL (ref 4.0–10.5)

## 2020-05-18 LAB — IBC + FERRITIN
Ferritin: 104.7 ng/mL (ref 22.0–322.0)
Iron: 79 ug/dL (ref 42–165)
Saturation Ratios: 31.5 % (ref 20.0–50.0)
Transferrin: 179 mg/dL — ABNORMAL LOW (ref 212.0–360.0)

## 2020-05-18 LAB — PSA, MEDICARE: PSA: 0 ng/ml — ABNORMAL LOW (ref 0.10–4.00)

## 2020-05-20 ENCOUNTER — Other Ambulatory Visit: Payer: Self-pay | Admitting: Family Medicine

## 2020-05-20 DIAGNOSIS — R7989 Other specified abnormal findings of blood chemistry: Secondary | ICD-10-CM

## 2020-05-25 ENCOUNTER — Ambulatory Visit: Payer: Medicare PPO

## 2020-05-25 ENCOUNTER — Telehealth: Payer: Self-pay

## 2020-05-25 ENCOUNTER — Other Ambulatory Visit: Payer: Self-pay

## 2020-05-25 NOTE — Telephone Encounter (Signed)
Called patient 4 times trying to complete AWV. Kept getting voicemail that said, "Voicemail box not setup right now please try your call again later". Appointment was cancelled.

## 2020-05-25 NOTE — Progress Notes (Deleted)
Subjective:   Rick [name] is a 86 y.o. male who presents for Medicare Annual/Subsequent preventive examination.  Review of Systems: N/A     I connected with the patient today by telephone and verified that I am speaking with the correct person using two identifiers. Location patient: home Location nurse: work Persons participating in the telephone visit: patient, nurse.   I discussed the limitations, risks, security and privacy concerns of performing an evaluation and management service by telephone and the availability of in person appointments. I also discussed with the patient that there may be a patient responsible charge related to this service. The patient expressed understanding and verbally consented to this telephonic visit.              Objective:    There were no vitals filed for this visit. There is no height or weight on file to calculate BMI.  Advanced Directives 11/11/2018 10/23/2017 10/15/2016 01/11/2015 01/11/2015 08/04/2012  Does Patient Have a Medical Advance Directive? No No No No No Patient does not have advance directive  Would patient like information on creating a medical advance directive? No - Patient declined Yes (MAU/Ambulatory/Procedural Areas - Information given) - No - patient declined information No - patient declined information -    Current Medications (verified) Outpatient Encounter Medications as of 05/25/2020  Medication Sig  . amlodipine-atorvastatin (CADUET) 10-10 MG tablet TAKE 1 TABLET BY MOUTH DAILY. MUST SCHEDULE OFFICE VISIT  . Ferrous Sulfate (IRON) 325 (65 FE) MG TABS Take 325 mg by mouth daily.  Marland Kitchen latanoprost (XALATAN) 0.005 % ophthalmic solution Place 1 drop into both eyes at bedtime.  . metoprolol succinate (TOPROL-XL) 50 MG 24 hr tablet TAKE 1 TABLET BY MOUTH EVERY DAY IMMEDIATELY FOLLOWING A MEAL .NEEDS APPT FOR REFILLS  . omeprazole (PRILOSEC) 20 MG capsule Take 1 capsule (20 mg total) by mouth daily.   No facility-administered  encounter medications on file as of 05/25/2020.    Allergies (verified) Lisinopril   History: Past Medical History:  Diagnosis Date  . Arthritis   . GERD (gastroesophageal reflux disease)   . History of colon polyps   . History of prostate cancer   . Hyperlipidemia   . Hypertension   . Kidney cysts    bilateral.  Renal US   Past Surgical History:  Procedure Laterality Date  . COLONOSCOPY    . CYSTOSCOPY  02/12/99   biopsy  . ESOPHAGEAL BANDING N/A 08/04/2012   Procedure: ESOPHAGEAL BANDING;  Surgeon: Inda Castle, MD;  Location: [facility];  Service: Endoscopy;  Laterality: N/A;  . ESOPHAGOGASTRODUODENOSCOPY N/A 08/04/2012   Procedure: ESOPHAGOGASTRODUODENOSCOPY (EGD);  Surgeon: Inda Castle, MD;  Location: [facility];  Service: Endoscopy;  Laterality: N/A;  . INGUINAL HERNIA REPAIR  02/1999   Dr. Reece Agar  . PROSTATECTOMY  02/1999   Family History  Problem Relation Age of Onset  . Bone cancer Sister   . Stomach cancer Brother   . Stomach cancer Brother   . Lung cancer Sister   . Colon cancer Sister   . Stroke Mother   . Colon cancer Other        nephew   Social History   Socioeconomic History  . Marital status: Married    Spouse name: Not on file  . Number of children: 6  . Years of education: Not on file  . Highest education level: Not on file  Occupational History  . Occupation: retired- Patent attorney, Biochemist, clinical    Comment:  at [employer], [place] workman    Employer: RETIRED  Tobacco Use  . Smoking status: Former Smoker    Types: Cigarettes    Quit date: 08/26/2001    Years since quitting: 18.7  . Smokeless tobacco: Current User    Types: Chew  . Tobacco comment: quit about 10 years or more  Vaping Use  . Vaping Use: Never used  Substance and Sexual Activity  . Alcohol use: Yes  . Drug use: No  . Sexual activity: Not Currently  Other Topics Concern  . Not on file  Social History Narrative   Retired Psychologist, sport and exercise, worked at  [employer], [place]   Married 1955   6 kids initially, oldest daughter died after surgery   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, 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   Social Determinants of Radio broadcast assistant Strain: Not on [employer] Insecurity: Not on file  Transportation Needs: Not on file  Physical Activity: Not on file  Stress: Not on file  Social Connections: Not on file    Tobacco Counseling Ready to quit: Not Answered Counseling given: Not Answered Comment: quit about 10 years or more   Clinical Intake:                 Diabetic: No Nutrition Risk Assessment:  Has the patient had any N/V/D within the last 2 months?  {YES/NO:21197} Does the patient have any non-healing wounds?  {YES/NO:21197} Has the patient had any unintentional weight loss or weight gain?  {YES/NO:21197}  Diabetes:  Is the patient diabetic?  No  If diabetic, was a CBG obtained today?  N/A Did the patient bring in their glucometer from home?  N/A How often do you monitor your CBG's? N/A.   Financial Strains and Diabetes Management:  Are you having any financial strains with the device, your supplies or your medication? N/A.  Does the patient want to be seen by Chronic Care Management for management of their diabetes?  N/A Would the patient like to be referred to a Nutritionist or for Diabetic Management?  N/A         Activities of Daily Living No flowsheet data found.  Patient Care Team: Tonia Ghent, MD as PCP - General (Family Medicine) Ardis Hughs, MD as Attending Physician (Urology) Clent Jacks, MD as Consulting Physician (Ophthalmology)  Indicate any recent Medical Services you may have received from other than [facility] providers in the past year (date may be approximate).     Assessment:   This is a routine wellness examination for Kel[place].  Hearing/Vision screen No exam data present  Dietary issues and exercise activities discussed:    Goals    . Patient Stated     Starting 11/11/2018,  I will continue to take medications as prescribed.       Depression Screen PHQ 2/9 Scores 11/11/2018 10/23/2017 10/22/2016 10/15/2016 01/12/2015  PHQ - 2 Score 0 0 0 0 0  PHQ- 9 Score 0 0 - - -    Fall Risk Fall Risk  11/11/2018 10/23/2017 10/22/2016 10/15/2016 01/12/2015  Falls in the past year? 1 No No No Yes  Comment falls due to loss of balance - - - -  Number falls in past yr: 1 - - - 1  Injury with Fall? 0 - - - -  Risk for fall due to : History of fall(s);Impaired balance/gait - - - -    FALL RISK PREVENTION PERTAINING TO THE HOME:  Any stairs in or around the home? Yes  If so, are there any without handrails? No  Home free of loose throw rugs in walkways, pet beds, electrical cords, etc? Yes  Adequate lighting in your home to reduce risk of falls? Yes   ASSISTIVE DEVICES UTILIZED TO PREVENT FALLS:  Life alert? {YES/NO:21197} Use of a cane, walker or w/c? {YES/NO:21197} Grab bars in the bathroom? {YES/NO:21197} Shower chair or bench in shower? {YES/NO:21197} Elevated toilet seat or a handicapped toilet? {YES/NO:21197}  TIMED UP AND GO:  Was the test performed? N/A, telephone visit.    Cognitive Function: MMSE - Mini Mental State Exam 11/11/2018 10/23/2017 10/15/2016  Orientation to time 5 5 5   Orientation to Place 5 5 5   Registration 3 3 3   Attention/ Calculation 0 0 0  Recall 3 3 1   Recall-comments - - pt was unable to recall 2 of 3 words  Language- name 2 objects 0 0 0  Language- repeat 1 1 1   Language- follow 3 step command 0 3 3  Language- read & follow direction 0 0 0  Write a sentence 0 0 0  Copy design 0 0 0  Total score 17 20 18   Mini Cog  Mini-Cog screen was completed. Maximum score is 22. A value of 0 denotes this part of the MMSE was not completed or the patient failed this part of the Mini-Cog screening.       Immunizations Immunization History  Administered Date(s) Administered  . Influenza Split 04/23/2011  . Influenza Whole 06/18/2005, 03/03/2008  .  Influenza, High Dose Seasonal PF 04/06/2017, 03/15/2019  . Influenza,inj,Quad PF,6+ Mos 04/19/2013  . Influenza-Unspecified 02/21/2016  . Pneumococcal Conjugate-13 01/12/2015  . Pneumococcal Polysaccharide-23 06/09/2000  . Td 06/09/1994, 03/09/2008  . Tetanus 06/09/2018    TDAP status: Up to date  {Flu Vaccine status:2101806}  Pneumococcal vaccine status: Up to date  {Covid-19 vaccine status:2101808}  Qualifies for Shingles Vaccine? Yes   Zostavax completed No   Shingrix Completed?: No.    Education has been provided regarding the importance of this vaccine. Patient has been advised to call insurance company to determine out of pocket expense if they have not yet received this vaccine. Advised may also receive vaccine at local pharmacy or Health Dept. Verbalized acceptance and understanding.  Screening Tests Health Maintenance  Topic Date Due  . COVID-19 Vaccine (1) Never done  . INFLUENZA VACCINE  01/08/2020  . TETANUS/TDAP  06/09/2028  . PNA vac Low Risk Adult  Completed    Health Maintenance  Health Maintenance Due  Topic Date Due  . COVID-19 Vaccine (1) Never done  . INFLUENZA VACCINE  01/08/2020    Colorectal cancer screening: No longer required.   Lung Cancer Screening: (Low Dose CT Chest recommended if Age 70-80 years, 30 pack-year currently smoking OR have quit w/in 15years.) does not qualify.  Additional Screening:  Hepatitis C Screening: does not qualify; Completed N/A  Vision Screening: Recommended annual ophthalmology exams for early detection of glaucoma and other disorders of the eye. Is the patient up to date with their annual eye exam?  {YES/NO:21197} Who is the provider or what is the name of the office in which the patient attends annual eye exams? *** If pt is not established with a provider, would they like to be referred to a provider to establish care? No .   Dental Screening: Recommended annual dental exams for proper oral hygiene  Community  Resource Referral / Chronic Care Management: CRR required this visit?  No   CCM required this visit?  No      Plan:     I have personally reviewed and noted the following in the patient's chart:   . Medical and social history . Use of alcohol, tobacco or illicit drugs  . Current medications and supplements . Functional ability and status . Nutritional status . Physical activity . Advanced directives . List of other physicians . Hospitalizations, surgeries, and ER visits in previous 12 months . Vitals . Screenings to include cognitive, depression, and falls . Referrals and appointments  In addition, I have reviewed and discussed with patient certain preventive protocols, quality metrics, and best practice recommendations. A written personalized care plan for preventive services as well as general preventive health recommendations were provided to patient.   Due to this being a telephonic visit, the after visit summary with patients personalized plan was offered to patient via office or my-chart.  Patient preferred to pick up at office at next visit or via mychart.   Andrez Grime, LPN   [id number]

## 2020-05-28 ENCOUNTER — Encounter: Payer: Self-pay | Admitting: Family Medicine

## 2020-05-28 ENCOUNTER — Ambulatory Visit (INDEPENDENT_AMBULATORY_CARE_PROVIDER_SITE_OTHER): Payer: Medicare PPO | Admitting: Family Medicine

## 2020-05-28 ENCOUNTER — Other Ambulatory Visit: Payer: Self-pay

## 2020-05-28 VITALS — BP 142/80 | HR 59 | Temp 95.7°F | Ht 68.0 in | Wt 162.7 lb

## 2020-05-28 DIAGNOSIS — Z7189 Other specified counseling: Secondary | ICD-10-CM

## 2020-05-28 DIAGNOSIS — Z8546 Personal history of malignant neoplasm of prostate: Secondary | ICD-10-CM

## 2020-05-28 DIAGNOSIS — K219 Gastro-esophageal reflux disease without esophagitis: Secondary | ICD-10-CM

## 2020-05-28 DIAGNOSIS — IMO0002 Reserved for concepts with insufficient information to code with codable children: Secondary | ICD-10-CM

## 2020-05-28 DIAGNOSIS — M25519 Pain in unspecified shoulder: Secondary | ICD-10-CM

## 2020-05-28 DIAGNOSIS — I10 Essential (primary) hypertension: Secondary | ICD-10-CM | POA: Diagnosis not present

## 2020-05-28 DIAGNOSIS — R7989 Other specified abnormal findings of blood chemistry: Secondary | ICD-10-CM

## 2020-05-28 DIAGNOSIS — D509 Iron deficiency anemia, unspecified: Secondary | ICD-10-CM | POA: Diagnosis not present

## 2020-05-28 DIAGNOSIS — Z0000 Encounter for general adult medical examination without abnormal findings: Secondary | ICD-10-CM

## 2020-05-28 DIAGNOSIS — E7800 Pure hypercholesterolemia, unspecified: Secondary | ICD-10-CM

## 2020-05-28 LAB — BASIC METABOLIC PANEL
BUN: 20 mg/dL (ref 6–23)
CO2: 27 mEq/L (ref 19–32)
Calcium: 9.1 mg/dL (ref 8.4–10.5)
Chloride: 101 mEq/L (ref 96–112)
Creatinine, Ser: 2.27 mg/dL — ABNORMAL HIGH (ref 0.40–1.50)
GFR: 25 mL/min — ABNORMAL LOW (ref >60.00)
Glucose, Bld: 103 mg/dL — ABNORMAL HIGH (ref 70–99)
Potassium: 3.7 mEq/L (ref 3.5–5.1)
Sodium: 135 mEq/L (ref 135–145)

## 2020-05-28 MED ORDER — METOPROLOL SUCCINATE ER 50 MG PO TB24: ORAL_TABLET | ORAL | 3 refills | Status: DC

## 2020-05-28 MED ORDER — AMLODIPINE-ATORVASTATIN 10-10 MG PO TABS: 1.0000 | ORAL_TABLET | Freq: Every day | ORAL | 3 refills | Status: DC

## 2020-05-28 NOTE — Progress Notes (Signed)
This visit occurred during the SARS-CoV-2 public health emergency.  Safety protocols were in place, including screening questions prior to the visit, additional usage of staff PPE, and extensive cleaning of exam room while observing appropriate contact time as indicated for disinfecting solutions.  Advance directive- wife designated if patient were incapacitated. PSA 0. D/w pt.   Colonoscopy not due given his age, d/w pt.  PNA up to date Flu shot prev done.  tetanus 2020 at the [place].  covid vaccine prev done.   Shingles d/w pt.    GERD.  Still on PPI.  No GERD sx while on med.  Failed taper.  No blood in stool.    Cr elevation d/w pt. he had a history of elevated creatinine in the past that had improved on recheck but his most recent creatinine was again elevated.  He is not taking NSAIDs.  Routine cautions given to patient.  We agreed to recheck his labs today.  See notes on labs.  H/o anemia, on iron.  Labs d/w pt.  He has harder stools and black stools likely from iron.  He can manage as is. D/w pt.    Hypertension:    Using medication without problems or lightheadedness:  yes Chest pain with exertion:no Edema:some occ BLE edema, better overnight.   Short of breath:no Labs d/w pt.   Elevated Cholesterol: Using medications without problems: yes Muscle aches: no diffuse aches attributed to statin.   Diet compliance:  Exercise: Labs d/w pt.   He has L shoulder pain likely from rotator cuff irritation. Pain with overhead motion and sleeping on L shoulder.  Some days with more pain on ext rotation than other days.    PMH and SH reviewed  Meds, vitals, and allergies reviewed.   ROS: Per HPI unless specifically indicated in ROS section   GEN: nad, alert and oriented HEENT: ncat NECK: supple w/o LA CV: rrr. PULM: ctab, no inc wob ABD: soft, +bs EXT: no edema SKIN: no acute rash Some pain with ext rotation L shoulder.  Less pain with internal rotation.  No arm drop.  Distally  neurovascular intact.

## 2020-05-28 NOTE — Patient Instructions (Addendum)
Go to the lab on the way out.   If you have mychart we'll likely use that to update you.    Don't change your meds for now.  Use the shoulder exercises.  Let me know if that doesn't help.  Don't take aleve or ibuprofen or motrin.    Take care.  Glad to see you.

## 2020-05-29 ENCOUNTER — Ambulatory Visit (INDEPENDENT_AMBULATORY_CARE_PROVIDER_SITE_OTHER): Payer: Medicare PPO | Admitting: Ophthalmology

## 2020-05-29 ENCOUNTER — Encounter (INDEPENDENT_AMBULATORY_CARE_PROVIDER_SITE_OTHER): Payer: Self-pay | Admitting: Ophthalmology

## 2020-05-29 DIAGNOSIS — H11043 Peripheral pterygium, stationary, bilateral: Secondary | ICD-10-CM | POA: Diagnosis not present

## 2020-05-29 DIAGNOSIS — H35351 Cystoid macular degeneration, right eye: Secondary | ICD-10-CM | POA: Diagnosis not present

## 2020-05-29 DIAGNOSIS — H353131 Nonexudative age-related macular degeneration, bilateral, early dry stage: Secondary | ICD-10-CM | POA: Diagnosis not present

## 2020-05-29 DIAGNOSIS — H35031 Hypertensive retinopathy, right eye: Secondary | ICD-10-CM | POA: Diagnosis not present

## 2020-05-29 DIAGNOSIS — Z961 Presence of intraocular lens: Secondary | ICD-10-CM | POA: Diagnosis not present

## 2020-05-29 DIAGNOSIS — H3561 Retinal hemorrhage, right eye: Secondary | ICD-10-CM | POA: Diagnosis not present

## 2020-05-29 DIAGNOSIS — H401131 Primary open-angle glaucoma, bilateral, mild stage: Secondary | ICD-10-CM | POA: Diagnosis not present

## 2020-05-29 DIAGNOSIS — H35011 Changes in retinal vascular appearance, right eye: Secondary | ICD-10-CM

## 2020-05-29 NOTE — Assessment & Plan Note (Signed)
I discussed with the patient this asymptomatic finding discovered today by Dr. Carolynn Sayers on examination.  I explained to the patient that this is underlying hypertension related and that all efforts should be made to assure that hypertension is controlled.  I will also explained that these conditions can spontaneously erupt even with normal blood pressure at this current time.  No specific therapy is warranted.  There may be secondary serous elevation of fluid on the papillomacular bundle from this area however again no specific therapy is warranted as simple observation alone over time will in most cases resulted in successful resolution

## 2020-05-29 NOTE — Progress Notes (Signed)
05/29/2020     CHIEF COMPLAINT Patient presents for Retina Evaluation (NP VIT HEM OD, ref'd by R. Groat////Pt reports stable vision OU, no F/F, no pain or pressure. )   HISTORY OF PRESENT ILLNESS: Rick [name] is a 86 y.o. male who presents to the clinic today for:   HPI    Retina Evaluation    In right eye.  This started 1 day ago.  Duration of 1 day. Additional comments: NP VIT HEM OD, ref'd by R. Groat    Pt reports stable vision OU, no F/F, no pain or pressure.        Last edited by Nichola Sizer D on 05/29/2020  1:26 PM. (History)      Referring physician: Tonia Ghent, MD [place],  [place] [id number]  HISTORICAL INFORMATION:   Selected notes from the MEDICAL RECORD NUMBER       CURRENT MEDICATIONS: Current Outpatient Medications (Ophthalmic Drugs)  Medication Sig  . latanoprost (XALATAN) 0.005 % ophthalmic solution Place 1 drop into both eyes at bedtime.  . timolol (BETIMOL) 0.25 % ophthalmic solution Place 1-2 drops into both eyes every morning.   No current facility-administered medications for this visit. (Ophthalmic Drugs)   Current Outpatient Medications (Other)  Medication Sig  . amlodipine-atorvastatin (CADUET) 10-10 MG tablet Take 1 tablet by mouth daily.  . Ferrous Sulfate (IRON) 325 (65 FE) MG TABS Take 325 mg by mouth daily.  . metoprolol succinate (TOPROL-XL) 50 MG 24 hr tablet Take with or immediately following a meal.  . omeprazole (PRILOSEC) 20 MG capsule Take 1 capsule (20 mg total) by mouth daily.   No current facility-administered medications for this visit. (Other)      REVIEW OF SYSTEMS:    ALLERGIES Allergies  Allergen Reactions  . Lisinopril     cough    PAST MEDICAL HISTORY Past Medical History:  Diagnosis Date  . Arthritis   . GERD (gastroesophageal reflux disease)   . History of colon polyps   . History of prostate cancer   . Hyperlipidemia   . Hypertension   . Kidney cysts     bilateral.  Renal US   Past Surgical History:  Procedure Laterality Date  . COLONOSCOPY    . CYSTOSCOPY  02/12/99   biopsy  . ESOPHAGEAL BANDING N/A 08/04/2012   Procedure: ESOPHAGEAL BANDING;  Surgeon: Inda Castle, MD;  Location: [facility];  Service: Endoscopy;  Laterality: N/A;  . ESOPHAGOGASTRODUODENOSCOPY N/A 08/04/2012   Procedure: ESOPHAGOGASTRODUODENOSCOPY (EGD);  Surgeon: Inda Castle, MD;  Location: [facility];  Service: Endoscopy;  Laterality: N/A;  . INGUINAL HERNIA REPAIR  02/1999   Dr. Reece Agar  . PROSTATECTOMY  02/1999    FAMILY HISTORY Family History  Problem Relation Age of Onset  . Bone cancer Sister   . Stomach cancer Brother   . Stomach cancer Brother   . Lung cancer Sister   . Colon cancer Sister   . Stroke Mother   . Colon cancer Other        nephew    SOCIAL HISTORY Social History   Tobacco Use  . Smoking status: Former Smoker    Types: Cigarettes    Quit date: 08/26/2001    Years since quitting: 18.7  . Smokeless tobacco: Current User    Types: Chew  . Tobacco comment: quit about 10 years or more  Vaping Use  . Vaping Use: Never used  Substance Use Topics  . Alcohol use:  Not Currently  . Drug use: No         OPHTHALMIC EXAM:  Base Eye Exam    Visual Acuity (ETDRS)      Right Left   Dist cc 20/100 -1 20/60 +2   Dist ph cc 20/80 20/40 +2   Correction: Glasses       Tonometry (Tonopen, 1:38 PM)      Right Left   Pressure 14 18       Pupils      Dark   Right dilated   Left dilated       Visual Fields (Counting fingers)      Left Right   Restrictions Total superior temporal, inferior temporal, superior nasal, inferior nasal deficiencies Total superior temporal, inferior temporal, inferior nasal deficiencies       Extraocular Movement      Right Left    limited limited    -- -- --  --  --  -- -- --   -- -- --  --  --  -- -- --         Neuro/Psych    Oriented x3: Yes       Dilation    Both eyes: 1.0%  Mydriacyl, 2.5% Phenylephrine @ 1:38 PM        Slit Lamp and Fundus Exam    External Exam      Right Left   External Normal Normal       Slit Lamp Exam      Right Left   Lids/Lashes Normal Normal   Conjunctiva/Sclera White and quiet White and quiet   Cornea Clear Clear   Anterior Chamber Deep and quiet Deep and quiet   Iris Round and reactive Round and reactive   Anterior Vitreous Normal Normal       Fundus Exam      Right Left   Posterior Vitreous Posterior vitreous detachment Posterior vitreous detachment   Disc Normal Normal   C/D Ratio 0.45 0.7   Macula Normal Normal   Vessels Retinal artery macro aneurysm superior to the nerve proximal 1.5 disc diameters superonasal, with surrounding adjacent subretinal hemorrhage and distally thin attenuated artery Normal   Periphery Superior to the optic nerve, subretinal hemorrhage and intraretinal hemorrhage elevating with a central lucency characteristic of a retinal artery macro aneurysm, Central white, RAM, Subretinal hemorrhage adjacent, Attenuated retinal artery superonasal Normal          IMAGING AND PROCEDURES  Imaging and Procedures for 05/29/20  OCT, Retina - OU - Both Eyes       Right Eye Quality was good. Scan locations included subfoveal. Central Foveal Thickness: 309. Progression has worsened. Findings include cystoid macular edema, intraretinal fluid.   Left Eye Quality was good. Scan locations included subfoveal. Central Foveal Thickness: 274. Progression has been stable. Findings include no SRF.   Notes Minor CME in the nasal aspect of the PM bundle which is new OD but most likely this is a serous effusion coming from the retinal artery macro aneurysm leakage which is fairly close and superior to the nerve  Will observe OD       Color Fundus Photography Optos - OU - Both Eyes       Right Eye Disc findings include normal observations. Macula : geographic atrophy. Periphery : hemorrhage.   Left  Eye Progression has no prior data. Disc findings include normal observations. Macula : geographic atrophy. Vessels : normal observations.   Notes OD subretinal and intraretinal hemorrhage  superonasal to the nerve, 1.5 disc diameters away with a central "lucency" of white fibrotic round recently ruptured.  Distally a thin attenuated artery is noted.  OS normal                  ASSESSMENT/PLAN:  Retinal macroaneurysm of right eye I discussed with the patient this asymptomatic finding discovered today by Dr. Carolynn Sayers on examination.  I explained to the patient that this is underlying hypertension related and that all efforts should be made to assure that hypertension is controlled.  I will also explained that these conditions can spontaneously erupt even with normal blood pressure at this current time.  No specific therapy is warranted.  There may be secondary serous elevation of fluid on the papillomacular bundle from this area however again no specific therapy is warranted as simple observation alone over time will in most cases resulted in successful resolution  Retinal hemorrhage, right eye Not threatening to vision at this time, will observe  Hypertensive retinopathy, right eye Confirm with PCP that blood pressure is normal      ICD-10-CM   1. Retinal macroaneurysm of right eye  H35.011 Color Fundus Photography Optos - OU - Both Eyes  2. Retinal hemorrhage, right eye  H35.61 Color Fundus Photography Optos - OU - Both Eyes  3. Hypertensive retinopathy, right eye  H35.031 Color Fundus Photography Optos - OU - Both Eyes  4. Cystoid macular edema of right eye  H35.351 OCT, Retina - OU - Both Eyes    1.  Patient to follow-up with PCP to confirm blood pressure is at satisfactory levels  2.  Patient informed observation alone is usually sufficient for this asymptomatic condition to subside often taking months.  3.  Ophthalmic Meds Ordered this visit:  No orders of the defined  types were placed in this encounter.      Return in about 8 weeks (around 07/24/2020) for DILATE OU, COLOR FP, OCT.  Patient Instructions  Patient asked to contact the office promptly for new onset visual acuity declines or distortions.    Explained the diagnoses, plan, and follow up with the patient and they expressed understanding.  Patient expressed understanding of the importance of proper follow up care.   Clent Demark Jakwon Gayton M.D. Diseases & Surgery of the Retina and Vitreous Retina [facility] Diabetic [place] 05/29/20     Abbreviations: M myopia (nearsighted); A astigmatism; H hyperopia (farsighted); P presbyopia; Mrx spectacle prescription;  CTL contact lenses; OD right eye; OS left eye; OU both eyes  XT exotropia; ET esotropia; PEK punctate epithelial keratitis; PEE punctate epithelial erosions; DES dry eye syndrome; MGD meibomian gland dysfunction; ATs artificial tears; PFAT's preservative free artificial tears; [place] nuclear sclerotic cataract; PSC posterior subcapsular cataract; ERM epi-retinal membrane; PVD posterior vitreous detachment; RD retinal detachment; DM diabetes mellitus; DR diabetic retinopathy; NPDR non-proliferative diabetic retinopathy; PDR proliferative diabetic retinopathy; CSME clinically significant macular edema; DME diabetic macular edema; dbh dot blot hemorrhages; CWS cotton wool spot; POAG primary open angle glaucoma; C/D cup-to-disc ratio; HVF humphrey visual field; GVF goldmann visual field; OCT optical coherence tomography; IOP intraocular pressure; BRVO Branch retinal vein occlusion; CRVO central retinal vein occlusion; CRAO central retinal artery occlusion; BRAO branch retinal artery occlusion; RT retinal tear; SB scleral buckle; PPV pars plana vitrectomy; VH Vitreous hemorrhage; PRP panretinal laser photocoagulation; IVK intravitreal kenalog; VMT vitreomacular traction; MH Macular hole;  NVD neovascularization of the disc; NVE neovascularization elsewhere; AREDS age  related eye disease study; ARMD age related  macular degeneration; POAG primary open angle glaucoma; EBMD epithelial/anterior basement membrane dystrophy; ACIOL anterior chamber intraocular lens; IOL intraocular lens; PCIOL posterior chamber intraocular lens; Phaco/IOL phacoemulsification with intraocular lens placement; [place] photorefractive keratectomy; LASIK laser assisted in situ keratomileusis; HTN hypertension; DM diabetes mellitus; COPD chronic obstructive pulmonary disease

## 2020-05-29 NOTE — Patient Instructions (Signed)
Patient asked to contact the office promptly for new onset visual acuity declines or distortions.

## 2020-05-29 NOTE — Assessment & Plan Note (Signed)
Not threatening to vision at this time, will observe

## 2020-05-29 NOTE — Assessment & Plan Note (Signed)
Confirm with PCP that blood pressure is normal

## 2020-05-30 DIAGNOSIS — M25519 Pain in unspecified shoulder: Secondary | ICD-10-CM | POA: Insufficient documentation

## 2020-05-30 NOTE — Assessment & Plan Note (Signed)
Advance directive- wife designated if patient were incapacitated. PSA 0. D/w pt.   Colonoscopy not due given his age, d/w pt.  PNA up to date Flu shot prev done.  tetanus 2020 at the [place].  covid vaccine prev done.   Shingles d/w pt.

## 2020-05-30 NOTE — Assessment & Plan Note (Signed)
Advance directive- wife designated if patient were incapacitated.  

## 2020-05-30 NOTE — Assessment & Plan Note (Signed)
Likely from rotator cuff irritation.  Discussed home exercises, handout given and demonstrated.  He can try that and then update me as needed.  Avoid NSAIDs.

## 2020-05-30 NOTE — Assessment & Plan Note (Signed)
Continue atorvastatin.  Labs discussed with patient.  He agrees.

## 2020-05-30 NOTE — Assessment & Plan Note (Signed)
Continue amlodipine.  Labs discussed with patient.  He agrees.

## 2020-05-30 NOTE — Assessment & Plan Note (Signed)
He has harder stools and black stools likely from iron.  He can manage as is. D/w pt. hemoglobin is stable and iron is normal.  Would continue as is with iron replacement.

## 2020-05-30 NOTE — Assessment & Plan Note (Signed)
See notes on follow-up clinic.  Avoid NSAIDs.  Refer to renal.

## 2020-05-30 NOTE — Assessment & Plan Note (Signed)
PSA 0 discussed with patient.

## 2020-05-30 NOTE — Assessment & Plan Note (Signed)
Still on PPI.  No GERD sx while on med.  Failed taper.  No blood in stool.   I would continue as is with PPI for now.

## 2020-07-04 DIAGNOSIS — I129 Hypertensive chronic kidney disease with stage 1 through stage 4 chronic kidney disease, or unspecified chronic kidney disease: Secondary | ICD-10-CM | POA: Diagnosis not present

## 2020-07-04 DIAGNOSIS — R809 Proteinuria, unspecified: Secondary | ICD-10-CM | POA: Diagnosis not present

## 2020-07-04 DIAGNOSIS — N184 Chronic kidney disease, stage 4 (severe): Secondary | ICD-10-CM | POA: Diagnosis not present

## 2020-07-04 DIAGNOSIS — Q613 Polycystic kidney, unspecified: Secondary | ICD-10-CM | POA: Diagnosis not present

## 2020-07-04 LAB — BASIC METABOLIC PANEL
Creatinine: 1.9 — AB (ref <=1.3)
Glucose: 88

## 2020-07-04 LAB — CBC AND DIFFERENTIAL: Hemoglobin: 13.9 (ref 13.5–17.5)

## 2020-07-06 ENCOUNTER — Other Ambulatory Visit: Payer: Self-pay | Admitting: Nephrology

## 2020-07-06 ENCOUNTER — Other Ambulatory Visit: Payer: Self-pay | Admitting: Urology

## 2020-07-06 DIAGNOSIS — R809 Proteinuria, unspecified: Secondary | ICD-10-CM

## 2020-07-06 DIAGNOSIS — N184 Chronic kidney disease, stage 4 (severe): Secondary | ICD-10-CM

## 2020-07-06 DIAGNOSIS — Q613 Polycystic kidney, unspecified: Secondary | ICD-10-CM

## 2020-07-24 ENCOUNTER — Encounter (INDEPENDENT_AMBULATORY_CARE_PROVIDER_SITE_OTHER): Payer: Self-pay | Admitting: Ophthalmology

## 2020-07-24 ENCOUNTER — Ambulatory Visit (INDEPENDENT_AMBULATORY_CARE_PROVIDER_SITE_OTHER): Payer: Medicare PPO | Admitting: Ophthalmology

## 2020-07-24 ENCOUNTER — Other Ambulatory Visit: Payer: Self-pay

## 2020-07-24 DIAGNOSIS — H35011 Changes in retinal vascular appearance, right eye: Secondary | ICD-10-CM

## 2020-07-24 DIAGNOSIS — H35031 Hypertensive retinopathy, right eye: Secondary | ICD-10-CM | POA: Diagnosis not present

## 2020-07-24 DIAGNOSIS — H3561 Retinal hemorrhage, right eye: Secondary | ICD-10-CM | POA: Diagnosis not present

## 2020-07-24 NOTE — Assessment & Plan Note (Signed)
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

## 2020-07-24 NOTE — Progress Notes (Signed)
07/24/2020     CHIEF COMPLAINT Patient presents for Retina Follow Up (8 Week f\u OU. OCT and FP/Pt states vision is doing well. Denies FOL and floaters.)   HISTORY OF PRESENT ILLNESS: Rick [name] is a 87 y.o. male who presents to the clinic today for:   HPI    Retina Follow Up    Diagnosis: Macroaneurysm.  In right eye.  Severity is moderate.  Duration of 8 weeks.  Since onset it is stable.  I, the attending physician,  performed the HPI with the patient and updated documentation appropriately. Additional comments: 8 Week f\u OU. OCT and FP Pt states vision is doing well. Denies FOL and floaters.       Last edited by Tilda Franco on 07/24/2020 10:50 AM. (History)      Referring physician: Tonia Ghent, MD [place],  [place] [id number]  HISTORICAL INFORMATION:   Selected notes from the MEDICAL RECORD NUMBER       CURRENT MEDICATIONS: Current Outpatient Medications (Ophthalmic Drugs)  Medication Sig  . latanoprost (XALATAN) 0.005 % ophthalmic solution Place 1 drop into both eyes at bedtime.  . timolol (BETIMOL) 0.25 % ophthalmic solution Place 1-2 drops into both eyes every morning.   No current facility-administered medications for this visit. (Ophthalmic Drugs)   Current Outpatient Medications (Other)  Medication Sig  . amlodipine-atorvastatin (CADUET) 10-10 MG tablet Take 1 tablet by mouth daily.  . Ferrous Sulfate (IRON) 325 (65 FE) MG TABS Take 325 mg by mouth daily.  . metoprolol succinate (TOPROL-XL) 50 MG 24 hr tablet Take with or immediately following a meal.  . omeprazole (PRILOSEC) 20 MG capsule Take 1 capsule (20 mg total) by mouth daily.   No current facility-administered medications for this visit. (Other)      REVIEW OF SYSTEMS:    ALLERGIES Allergies  Allergen Reactions  . Lisinopril     cough    PAST MEDICAL HISTORY Past Medical History:  Diagnosis Date  . Arthritis   . GERD (gastroesophageal reflux  disease)   . History of colon polyps   . History of prostate cancer   . Hyperlipidemia   . Hypertension   . Kidney cysts    bilateral.  Renal US   Past Surgical History:  Procedure Laterality Date  . COLONOSCOPY    . CYSTOSCOPY  02/12/99   biopsy  . ESOPHAGEAL BANDING N/A 08/04/2012   Procedure: ESOPHAGEAL BANDING;  Surgeon: Inda Castle, MD;  Location: [facility];  Service: Endoscopy;  Laterality: N/A;  . ESOPHAGOGASTRODUODENOSCOPY N/A 08/04/2012   Procedure: ESOPHAGOGASTRODUODENOSCOPY (EGD);  Surgeon: Inda Castle, MD;  Location: [facility];  Service: Endoscopy;  Laterality: N/A;  . INGUINAL HERNIA REPAIR  02/1999   Dr. Reece Agar  . PROSTATECTOMY  02/1999    FAMILY HISTORY Family History  Problem Relation Age of Onset  . Bone cancer Sister   . Stomach cancer Brother   . Stomach cancer Brother   . Lung cancer Sister   . Colon cancer Sister   . Stroke Mother   . Colon cancer Other        nephew    SOCIAL HISTORY Social History   Tobacco Use  . Smoking status: Former Smoker    Types: Cigarettes    Quit date: 08/26/2001    Years since quitting: 18.9  . Smokeless tobacco: Current User    Types: Chew  . Tobacco comment: quit about 10 years or more  Vaping  Use  . Vaping Use: Never used  Substance Use Topics  . Alcohol use: Not Currently  . Drug use: No         OPHTHALMIC EXAM:  Base Eye Exam    Visual Acuity (Snellen - Linear)      Right Left   Dist cc 20/80 20/40 -1   Dist ph cc 20/60 +2    Correction: Glasses       Tonometry (Tonopen, 10:57 AM)      Right Left   Pressure 14 18       Pupils      Pupils Dark Light Shape React APD   Right PERRL 3 3 Round Minimal None   Left PERRL 3 3 Round Minimal None       Visual Fields (Counting fingers)      Left Right    Full Full       Neuro/Psych    Oriented x3: Yes   Mood/Affect: Normal       Dilation    Both eyes: 1.0% Mydriacyl, 2.5% Phenylephrine @ 10:57 AM        Slit Lamp and Fundus  Exam    External Exam      Right Left   External Normal Normal       Slit Lamp Exam      Right Left   Lids/Lashes Normal Normal   Conjunctiva/Sclera White and quiet White and quiet   Cornea Clear Clear   Anterior Chamber Deep and quiet Deep and quiet   Iris Round and reactive Round and reactive   Anterior Vitreous Normal Normal       Fundus Exam      Right Left   Posterior Vitreous Posterior vitreous detachment Posterior vitreous detachment   Disc Normal Normal   C/D Ratio 0.45 0.7   Macula Normal Normal   Vessels Retinal artery macro aneurysm superior to the nerve proximal .75 disc diametersin size superonasal, with surrounding adjacent subretinal hemorrhage smaller and distally thin attenuated artery Normal   Periphery Superior to the optic nerve, subretinal hemorrhage and intraretinal hemorrhage elevating with a central lucency characteristic of a retinal artery macro aneurysm, Central white, RAM, Subretinal hemorrhage adjacent, Attenuated retinal artery superonasal Normal          IMAGING AND PROCEDURES  Imaging and Procedures for 07/24/20  OCT, Retina - OU - Both Eyes       Right Eye Quality was good. Scan locations included subfoveal. Central Foveal Thickness: 250. Progression has improved. Findings include cystoid macular edema, intraretinal fluid.   Left Eye Quality was good. Scan locations included subfoveal. Central Foveal Thickness: 274. Findings include no SRF.   Notes Much less macular thickening now nasal aspect of the fovea as retinal macro aneurysm has continued to spontaneously resolved superonasal to the optic nerve.  Will observe OD  OS stable       Color Fundus Photography Optos - OU - Both Eyes       Right Eye Progression has improved. Disc findings include normal observations. Macula : geographic atrophy. Periphery : hemorrhage.   Left Eye Progression has been stable. Disc findings include normal observations. Macula : geographic  atrophy. Vessels : normal observations.   Notes OD subretinal and intraretinal hemorrhage superonasal to the nerve, smaller now with only 0.75 disc diameters in size with a central "lucency" of white fibrotic round recently ruptured.  Distally a thin attenuated artery is noted.  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 Retinal hemorrhage, right eye  H35.61   3. Hypertensive retinopathy, right eye  H35.031     1.  Continued observation OU.  2.  Visual acuity preserved and no macular threatening lesions at this time as the macular edema from this condition has continued to resolve  3.  Ophthalmic Meds Ordered this visit:  No orders of the defined types were placed in this encounter.      Return in about 6 months (around 01/21/2021) for DILATE OU, OCT.  There are no Patient Instructions on file for this visit.   Explained the diagnoses, plan, and follow up with the patient and they expressed understanding.  Patient expressed understanding of the importance of proper follow up care.   Clent Demark Davona Kinoshita M.D. Diseases & Surgery of the Retina and Vitreous Retina & Diabetic [place] 07/24/20     Abbreviations: M myopia (nearsighted); A astigmatism; H hyperopia (farsighted); P presbyopia; Mrx spectacle prescription;  CTL contact lenses; OD right eye; OS left eye;  OU both eyes  XT exotropia; ET esotropia; PEK punctate epithelial keratitis; PEE punctate epithelial erosions; DES dry eye syndrome; MGD meibomian gland dysfunction; ATs artificial tears; PFAT's preservative free artificial tears; [place] nuclear sclerotic cataract; PSC posterior subcapsular cataract; ERM epi-retinal membrane; PVD posterior vitreous detachment; RD retinal detachment; DM diabetes mellitus; DR diabetic retinopathy; NPDR non-proliferative diabetic retinopathy; PDR proliferative diabetic retinopathy; CSME clinically significant macular edema; DME diabetic macular edema; dbh dot blot hemorrhages; CWS cotton wool spot; POAG primary open angle glaucoma; C/D cup-to-disc ratio; HVF humphrey visual field; GVF goldmann visual field; OCT optical coherence tomography; IOP intraocular pressure; BRVO Branch retinal vein occlusion; CRVO central retinal vein occlusion; CRAO central retinal artery occlusion; BRAO branch retinal artery occlusion; RT retinal tear; SB scleral buckle; PPV pars plana vitrectomy; VH Vitreous hemorrhage; PRP panretinal laser photocoagulation; IVK intravitreal kenalog; VMT vitreomacular traction; MH Macular hole;  NVD neovascularization of the disc; NVE neovascularization elsewhere; AREDS age related eye disease study; ARMD age related macular degeneration; POAG primary open angle glaucoma; EBMD epithelial/anterior basement membrane dystrophy; ACIOL anterior chamber intraocular lens; IOL intraocular lens; PCIOL posterior chamber intraocular lens; Phaco/IOL phacoemulsification with intraocular lens placement; [place] photorefractive keratectomy; LASIK laser assisted in situ keratomileusis; HTN hypertension; DM diabetes mellitus; COPD chronic obstructive pulmonary disease

## 2020-07-24 NOTE — Assessment & Plan Note (Signed)
Control blood pressure

## 2020-07-24 NOTE — Assessment & Plan Note (Signed)
Improving spontaneously OD will continue to observe

## 2020-08-06 ENCOUNTER — Ambulatory Visit
Admission: RE | Admit: 2020-08-06 | Discharge: 2020-08-06 | Disposition: A | Discharge status: Home / Self Care | Payer: Medicare PPO | Source: Ambulatory Visit | Attending: Nephrology | Admitting: Nephrology

## 2020-08-06 DIAGNOSIS — N184 Chronic kidney disease, stage 4 (severe): Secondary | ICD-10-CM

## 2020-08-06 DIAGNOSIS — Q613 Polycystic kidney, unspecified: Secondary | ICD-10-CM

## 2020-08-06 DIAGNOSIS — Q612 Polycystic kidney, adult type: Secondary | ICD-10-CM | POA: Diagnosis not present

## 2020-08-06 DIAGNOSIS — R809 Proteinuria, unspecified: Secondary | ICD-10-CM

## 2020-08-06 DIAGNOSIS — N200 Calculus of kidney: Secondary | ICD-10-CM | POA: Diagnosis not present

## 2020-08-14 ENCOUNTER — Other Ambulatory Visit: Payer: Self-pay | Admitting: Nephrology

## 2020-08-14 DIAGNOSIS — N281 Cyst of kidney, acquired: Secondary | ICD-10-CM | POA: Diagnosis not present

## 2020-08-14 DIAGNOSIS — R809 Proteinuria, unspecified: Secondary | ICD-10-CM | POA: Diagnosis not present

## 2020-08-14 DIAGNOSIS — Q613 Polycystic kidney, unspecified: Secondary | ICD-10-CM

## 2020-08-14 DIAGNOSIS — I129 Hypertensive chronic kidney disease with stage 1 through stage 4 chronic kidney disease, or unspecified chronic kidney disease: Secondary | ICD-10-CM | POA: Diagnosis not present

## 2020-08-14 DIAGNOSIS — N184 Chronic kidney disease, stage 4 (severe): Secondary | ICD-10-CM | POA: Diagnosis not present

## 2020-08-22 DIAGNOSIS — N184 Chronic kidney disease, stage 4 (severe): Secondary | ICD-10-CM | POA: Diagnosis not present

## 2020-09-01 ENCOUNTER — Ambulatory Visit
Admission: RE | Admit: 2020-09-01 | Discharge: 2020-09-01 | Disposition: A | Discharge status: Home / Self Care | Payer: Medicare PPO | Source: Ambulatory Visit | Attending: Nephrology | Admitting: Nephrology

## 2020-09-01 ENCOUNTER — Other Ambulatory Visit: Payer: Self-pay

## 2020-09-01 ENCOUNTER — Other Ambulatory Visit: Payer: Self-pay | Admitting: Nephrology

## 2020-09-01 DIAGNOSIS — Q613 Polycystic kidney, unspecified: Secondary | ICD-10-CM

## 2020-09-01 DIAGNOSIS — K8020 Calculus of gallbladder without cholecystitis without obstruction: Secondary | ICD-10-CM | POA: Diagnosis not present

## 2020-09-01 DIAGNOSIS — N2889 Other specified disorders of kidney and ureter: Secondary | ICD-10-CM | POA: Diagnosis not present

## 2020-10-17 DIAGNOSIS — D631 Anemia in chronic kidney disease: Secondary | ICD-10-CM | POA: Diagnosis not present

## 2020-10-17 DIAGNOSIS — E7800 Pure hypercholesterolemia, unspecified: Secondary | ICD-10-CM | POA: Diagnosis not present

## 2020-10-17 DIAGNOSIS — Q613 Polycystic kidney, unspecified: Secondary | ICD-10-CM | POA: Diagnosis not present

## 2020-10-17 DIAGNOSIS — N184 Chronic kidney disease, stage 4 (severe): Secondary | ICD-10-CM | POA: Diagnosis not present

## 2020-10-17 DIAGNOSIS — R609 Edema, unspecified: Secondary | ICD-10-CM | POA: Diagnosis not present

## 2020-10-17 DIAGNOSIS — N2581 Secondary hyperparathyroidism of renal origin: Secondary | ICD-10-CM | POA: Diagnosis not present

## 2020-10-17 DIAGNOSIS — R809 Proteinuria, unspecified: Secondary | ICD-10-CM | POA: Diagnosis not present

## 2020-10-17 DIAGNOSIS — I129 Hypertensive chronic kidney disease with stage 1 through stage 4 chronic kidney disease, or unspecified chronic kidney disease: Secondary | ICD-10-CM | POA: Diagnosis not present

## 2020-10-24 DIAGNOSIS — N184 Chronic kidney disease, stage 4 (severe): Secondary | ICD-10-CM | POA: Diagnosis not present

## 2020-10-24 DIAGNOSIS — I129 Hypertensive chronic kidney disease with stage 1 through stage 4 chronic kidney disease, or unspecified chronic kidney disease: Secondary | ICD-10-CM | POA: Diagnosis not present

## 2020-11-01 DIAGNOSIS — N184 Chronic kidney disease, stage 4 (severe): Secondary | ICD-10-CM | POA: Diagnosis not present

## 2020-11-30 ENCOUNTER — Inpatient Hospital Stay (HOSPITAL_COMMUNITY)
Admission: EM | Admit: 2020-11-30 | Discharge: 2020-12-02 | DRG: 291 | Disposition: A | Discharge status: Home / Self Care | Payer: Medicare PPO | Attending: Family Medicine | Admitting: Family Medicine

## 2020-11-30 ENCOUNTER — Other Ambulatory Visit: Payer: Self-pay

## 2020-11-30 ENCOUNTER — Encounter (HOSPITAL_COMMUNITY): Payer: Self-pay

## 2020-11-30 ENCOUNTER — Emergency Department (HOSPITAL_COMMUNITY): Payer: Medicare PPO

## 2020-11-30 DIAGNOSIS — Z79899 Other long term (current) drug therapy: Secondary | ICD-10-CM | POA: Diagnosis not present

## 2020-11-30 DIAGNOSIS — R197 Diarrhea, unspecified: Secondary | ICD-10-CM

## 2020-11-30 DIAGNOSIS — I110 Hypertensive heart disease with heart failure: Secondary | ICD-10-CM | POA: Diagnosis not present

## 2020-11-30 DIAGNOSIS — I160 Hypertensive urgency: Secondary | ICD-10-CM | POA: Diagnosis not present

## 2020-11-30 DIAGNOSIS — Z888 Allergy status to other drugs, medicaments and biological substances status: Secondary | ICD-10-CM | POA: Diagnosis not present

## 2020-11-30 DIAGNOSIS — I700 Atherosclerosis of aorta: Secondary | ICD-10-CM | POA: Diagnosis not present

## 2020-11-30 DIAGNOSIS — I5043 Acute on chronic combined systolic (congestive) and diastolic (congestive) heart failure: Secondary | ICD-10-CM | POA: Insufficient documentation

## 2020-11-30 DIAGNOSIS — K219 Gastro-esophageal reflux disease without esophagitis: Secondary | ICD-10-CM | POA: Diagnosis present

## 2020-11-30 DIAGNOSIS — R109 Unspecified abdominal pain: Secondary | ICD-10-CM | POA: Diagnosis not present

## 2020-11-30 DIAGNOSIS — M1990 Unspecified osteoarthritis, unspecified site: Secondary | ICD-10-CM | POA: Diagnosis present

## 2020-11-30 DIAGNOSIS — Q613 Polycystic kidney, unspecified: Secondary | ICD-10-CM | POA: Diagnosis not present

## 2020-11-30 DIAGNOSIS — I130 Hypertensive heart and chronic kidney disease with heart failure and stage 1 through stage 4 chronic kidney disease, or unspecified chronic kidney disease: Principal | ICD-10-CM | POA: Diagnosis present

## 2020-11-30 DIAGNOSIS — N200 Calculus of kidney: Secondary | ICD-10-CM | POA: Diagnosis not present

## 2020-11-30 DIAGNOSIS — K8020 Calculus of gallbladder without cholecystitis without obstruction: Secondary | ICD-10-CM | POA: Diagnosis not present

## 2020-11-30 DIAGNOSIS — H409 Unspecified glaucoma: Secondary | ICD-10-CM | POA: Diagnosis present

## 2020-11-30 DIAGNOSIS — I248 Other forms of acute ischemic heart disease: Secondary | ICD-10-CM | POA: Diagnosis present

## 2020-11-30 DIAGNOSIS — Z801 Family history of malignant neoplasm of trachea, bronchus and lung: Secondary | ICD-10-CM

## 2020-11-30 DIAGNOSIS — Z20822 Contact with and (suspected) exposure to covid-19: Secondary | ICD-10-CM | POA: Diagnosis not present

## 2020-11-30 DIAGNOSIS — I2510 Atherosclerotic heart disease of native coronary artery without angina pectoris: Secondary | ICD-10-CM | POA: Diagnosis present

## 2020-11-30 DIAGNOSIS — N189 Chronic kidney disease, unspecified: Secondary | ICD-10-CM

## 2020-11-30 DIAGNOSIS — I509 Heart failure, unspecified: Secondary | ICD-10-CM

## 2020-11-30 DIAGNOSIS — I5033 Acute on chronic diastolic (congestive) heart failure: Secondary | ICD-10-CM | POA: Insufficient documentation

## 2020-11-30 DIAGNOSIS — N179 Acute kidney failure, unspecified: Secondary | ICD-10-CM | POA: Diagnosis not present

## 2020-11-30 DIAGNOSIS — I5031 Acute diastolic (congestive) heart failure: Secondary | ICD-10-CM | POA: Diagnosis not present

## 2020-11-30 DIAGNOSIS — J9601 Acute respiratory failure with hypoxia: Secondary | ICD-10-CM | POA: Diagnosis present

## 2020-11-30 DIAGNOSIS — I340 Nonrheumatic mitral (valve) insufficiency: Secondary | ICD-10-CM | POA: Diagnosis present

## 2020-11-30 DIAGNOSIS — Z8546 Personal history of malignant neoplasm of prostate: Secondary | ICD-10-CM

## 2020-11-30 DIAGNOSIS — E785 Hyperlipidemia, unspecified: Secondary | ICD-10-CM | POA: Diagnosis present

## 2020-11-30 DIAGNOSIS — Z87891 Personal history of nicotine dependence: Secondary | ICD-10-CM | POA: Diagnosis not present

## 2020-11-30 DIAGNOSIS — N184 Chronic kidney disease, stage 4 (severe): Secondary | ICD-10-CM | POA: Diagnosis present

## 2020-11-30 DIAGNOSIS — Z823 Family history of stroke: Secondary | ICD-10-CM | POA: Diagnosis not present

## 2020-11-30 DIAGNOSIS — R1084 Generalized abdominal pain: Secondary | ICD-10-CM

## 2020-11-30 DIAGNOSIS — K5750 Diverticulosis of both small and large intestine without perforation or abscess without bleeding: Secondary | ICD-10-CM | POA: Diagnosis not present

## 2020-11-30 DIAGNOSIS — I5023 Acute on chronic systolic (congestive) heart failure: Secondary | ICD-10-CM | POA: Diagnosis not present

## 2020-11-30 DIAGNOSIS — R0602 Shortness of breath: Secondary | ICD-10-CM

## 2020-11-30 DIAGNOSIS — R059 Cough, unspecified: Secondary | ICD-10-CM | POA: Diagnosis not present

## 2020-11-30 DIAGNOSIS — Z800 Family history of malignant neoplasm of digestive organs: Secondary | ICD-10-CM | POA: Diagnosis not present

## 2020-11-30 DIAGNOSIS — Q6102 Congenital multiple renal cysts: Secondary | ICD-10-CM

## 2020-11-30 DIAGNOSIS — K4090 Unilateral inguinal hernia, without obstruction or gangrene, not specified as recurrent: Secondary | ICD-10-CM | POA: Diagnosis not present

## 2020-11-30 LAB — CBC
HCT: 40.1 % (ref 39.0–52.0)
Hemoglobin: 13.4 g/dL (ref 13.0–17.0)
MCH: 30.9 pg (ref 26.0–34.0)
MCHC: 33.4 g/dL (ref 30.0–36.0)
MCV: 92.4 fL (ref 80.0–100.0)
Platelets: 189 10*3/uL (ref 150–400)
RBC: 4.34 MIL/uL (ref 4.22–5.81)
RDW: 15.6 % — ABNORMAL HIGH (ref 11.5–15.5)
WBC: 8.1 10*3/uL (ref 4.0–10.5)
nRBC: 0 % (ref 0.0–0.2)

## 2020-11-30 LAB — COMPREHENSIVE METABOLIC PANEL
ALT: 14 U/L (ref 0–44)
AST: 22 U/L (ref 15–41)
Albumin: 2.7 g/dL — ABNORMAL LOW (ref 3.5–5.0)
Alkaline Phosphatase: 61 U/L (ref 38–126)
Anion gap: 11 (ref 5–15)
BUN: 20 mg/dL (ref 8–23)
CO2: 25 mmol/L (ref 22–32)
Calcium: 8.5 mg/dL — ABNORMAL LOW (ref 8.9–10.3)
Chloride: 95 mmol/L — ABNORMAL LOW (ref 98–111)
Creatinine, Ser: 2.87 mg/dL — ABNORMAL HIGH (ref 0.61–1.24)
GFR, Estimated: 20 mL/min — ABNORMAL LOW (ref >60)
Glucose, Bld: 100 mg/dL — ABNORMAL HIGH (ref 70–99)
Potassium: 3.6 mmol/L (ref 3.5–5.1)
Sodium: 131 mmol/L — ABNORMAL LOW (ref 135–145)
Total Bilirubin: 1.3 mg/dL — ABNORMAL HIGH (ref 0.3–1.2)
Total Protein: 6.9 g/dL (ref 6.5–8.1)

## 2020-11-30 LAB — RESP PANEL BY RT-PCR (FLU A&B, COVID) ARPGX2
Influenza A by PCR: NEGATIVE
Influenza B by PCR: NEGATIVE
SARS Coronavirus 2 by RT PCR: NEGATIVE

## 2020-11-30 LAB — LIPASE, BLOOD: Lipase: 37 U/L (ref 11–51)

## 2020-11-30 LAB — URINALYSIS, ROUTINE W REFLEX MICROSCOPIC
Bilirubin Urine: NEGATIVE
Glucose, UA: NEGATIVE mg/dL
Ketones, ur: NEGATIVE mg/dL
Nitrite: NEGATIVE
Protein, ur: 100 mg/dL — AB
Specific Gravity, Urine: 1.01 (ref 1.005–1.030)
WBC, UA: >50 WBC/hpf — ABNORMAL HIGH (ref 0–5)
pH: 7 (ref 5.0–8.0)

## 2020-11-30 LAB — TROPONIN I (HIGH SENSITIVITY)
Troponin I (High Sensitivity): 47 ng/L — ABNORMAL HIGH (ref <18)
Troponin I (High Sensitivity): 45 ng/L — ABNORMAL HIGH (ref <18)

## 2020-11-30 LAB — BRAIN NATRIURETIC PEPTIDE: B Natriuretic Peptide: 2320.4 pg/mL — ABNORMAL HIGH (ref 0.0–100.0)

## 2020-11-30 MED ORDER — HEPARIN SODIUM (PORCINE) 5000 UNIT/ML IJ SOLN
5000.0000 [IU] | Freq: Three times a day (TID) | INTRAMUSCULAR | Status: DC
  Administered 2020-11-30 – 2020-12-02 (×5): 5000 [IU] via SUBCUTANEOUS
  Filled 2020-11-30 (×4): qty 1

## 2020-11-30 MED ORDER — ACETAMINOPHEN 650 MG RE SUPP: 650.0000 mg | Freq: Four times a day (QID) | RECTAL | Status: DC | PRN

## 2020-11-30 MED ORDER — HYDRALAZINE HCL 20 MG/ML IJ SOLN
10.0000 mg | Freq: Once | INTRAMUSCULAR | Status: AC
  Administered 2020-11-30: 10 mg via INTRAVENOUS
  Filled 2020-11-30: qty 1

## 2020-11-30 MED ORDER — ONDANSETRON HCL 4 MG/2ML IJ SOLN: 4.0000 mg | Freq: Four times a day (QID) | INTRAMUSCULAR | Status: DC | PRN

## 2020-11-30 MED ORDER — LATANOPROST 0.005 % OP SOLN
1.0000 [drp] | Freq: Every day | OPHTHALMIC | Status: DC
  Administered 2020-11-30 – 2020-12-01 (×2): 1 [drp] via OPHTHALMIC
  Filled 2020-11-30: qty 2.5

## 2020-11-30 MED ORDER — METOPROLOL SUCCINATE ER 50 MG PO TB24
50.0000 mg | ORAL_TABLET | Freq: Every day | ORAL | Status: DC
  Administered 2020-12-01 – 2020-12-02 (×2): 50 mg via ORAL
  Filled 2020-11-30 (×2): qty 1

## 2020-11-30 MED ORDER — FUROSEMIDE 10 MG/ML IJ SOLN
60.0000 mg | Freq: Once | INTRAMUSCULAR | Status: AC
  Administered 2020-11-30: 60 mg via INTRAVENOUS
  Filled 2020-11-30: qty 8

## 2020-11-30 MED ORDER — HYDRALAZINE HCL 20 MG/ML IJ SOLN: 20.0000 mg | Freq: Once | INTRAMUSCULAR | Status: DC

## 2020-11-30 MED ORDER — ATORVASTATIN CALCIUM 10 MG PO TABS
10.0000 mg | ORAL_TABLET | Freq: Every day | ORAL | Status: DC
  Administered 2020-11-30 – 2020-12-01 (×2): 10 mg via ORAL
  Filled 2020-11-30 (×2): qty 1

## 2020-11-30 MED ORDER — NICARDIPINE HCL IN NACL 20-0.86 MG/200ML-% IV SOLN
3.0000 mg/h | INTRAVENOUS | Status: DC
  Administered 2020-11-30: 5 mg/h via INTRAVENOUS
  Filled 2020-11-30: qty 200

## 2020-11-30 MED ORDER — LABETALOL HCL 5 MG/ML IV SOLN
10.0000 mg | Freq: Once | INTRAVENOUS | Status: DC
  Filled 2020-11-30: qty 4

## 2020-11-30 MED ORDER — HYDRALAZINE HCL 20 MG/ML IJ SOLN
10.0000 mg | Freq: Three times a day (TID) | INTRAMUSCULAR | Status: DC | PRN
  Administered 2020-11-30 – 2020-12-01 (×2): 10 mg via INTRAVENOUS
  Filled 2020-11-30 (×2): qty 1

## 2020-11-30 MED ORDER — FENTANYL CITRATE (PF) 100 MCG/2ML IJ SOLN
50.0000 ug | Freq: Once | INTRAMUSCULAR | Status: AC
  Administered 2020-11-30: 50 ug via INTRAVENOUS
  Filled 2020-11-30: qty 2

## 2020-11-30 MED ORDER — FUROSEMIDE 10 MG/ML IJ SOLN
60.0000 mg | Freq: Two times a day (BID) | INTRAMUSCULAR | Status: DC
  Administered 2020-11-30 – 2020-12-01 (×3): 60 mg via INTRAVENOUS
  Filled 2020-11-30 (×3): qty 6

## 2020-11-30 MED ORDER — ACETAMINOPHEN 325 MG PO TABS: 650.0000 mg | ORAL_TABLET | Freq: Four times a day (QID) | ORAL | Status: DC | PRN

## 2020-11-30 MED ORDER — NITROGLYCERIN 0.4 MG SL SUBL
0.4000 mg | SUBLINGUAL_TABLET | SUBLINGUAL | Status: DC | PRN
  Filled 2020-11-30: qty 1

## 2020-11-30 MED ORDER — ONDANSETRON HCL 4 MG PO TABS: 4.0000 mg | ORAL_TABLET | Freq: Four times a day (QID) | ORAL | Status: DC | PRN

## 2020-11-30 NOTE — ED Notes (Signed)
Messaged ED provider Dr. Billy Fischer regarding pt's hypertensive BP's and low O2 sats requiring nasal cannula O2. No further orders at this time. Will continue to monitor.

## 2020-11-30 NOTE — ED Notes (Signed)
Provider at bedside with US.

## 2020-11-30 NOTE — ED Provider Notes (Signed)
[facility] DEPT Provider Note   CSN: [id number] Arrival date & time: 11/30/20  2595     History Chief Complaint  Patient presents with   Abdominal Pain    Rick [name] is a 87 y.o. male.  HPI     87 year old male with history of hypertension, hyperlipidemia, arthritis, presents with concern for abdominal pain and distention for 2 days, and increasing shortness of breath over the last week.  Presents with concern for abdominal pain, distention for 2 days Increasing shortness of breath over the last week, chronic cough No fever Swelling present chronically Orthopnea No n/v/d  Past Medical History:  Diagnosis Date   Arthritis    GERD (gastroesophageal reflux disease)    History of colon polyps    History of prostate cancer    Hyperlipidemia    Hypertension    Kidney cysts    bilateral.  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   Retinal hemorrhage, right eye 05/29/2020   Hypertensive retinopathy, right eye 05/29/2020   Creatinine elevation 11/17/2018   Cough 10/25/2017   Health care maintenance 10/22/2016   Anemia, iron deficiency 04/11/2015   Advance care planning 01/16/2015   Benign paroxysmal positional vertigo 01/16/2015   History of prostate cancer 02/02/2014   Bilateral renal cysts 01/31/2014   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   Family history of malignant neoplasm of gastrointestinal tract 03/04/2012   Medicare annual wellness visit, subsequent 01/23/2012   VENTRAL HERNIA, ASYMPTOMATIC 03/09/2008   TOBACCO ABUSE, HX OF 63/87/5643   HELICOBACTER PYLORI INFECTION 11/25/2006   HYPERCHOLESTEROLEMIA 11/25/2006   Essential hypertension 11/25/2006   GERD 11/25/2006   Hyperglycemia 11/25/2006    Past Surgical History:  Procedure Laterality Date   COLONOSCOPY      CYSTOSCOPY  02/12/99   biopsy   ESOPHAGEAL BANDING N/A 08/04/2012   Procedure: ESOPHAGEAL BANDING;  Surgeon: Inda Castle, MD;  Location: [facility];  Service: Endoscopy;  Laterality: N/A;   ESOPHAGOGASTRODUODENOSCOPY N/A 08/04/2012   Procedure: ESOPHAGOGASTRODUODENOSCOPY (EGD);  Surgeon: Inda Castle, MD;  Location: [facility];  Service: Endoscopy;  Laterality: N/A;   INGUINAL HERNIA REPAIR  02/1999   Dr. Reece Agar   PROSTATECTOMY  02/1999       Family History  Problem Relation Age of Onset   Bone cancer Sister    Stomach cancer Brother    Stomach cancer Brother    Lung cancer Sister    Colon cancer Sister    Stroke Mother    Colon cancer Other        nephew    Social History   Tobacco Use   Smoking status: Former    Pack years: 0.00    Types: Cigarettes    Quit date: 08/26/2001    Years since quitting: 19.2   Smokeless tobacco: Current    Types: Chew   Tobacco comments:    quit about 10 years or more  Vaping Use   Vaping Use: Never used  Substance Use Topics   Alcohol use: Yes   Drug use: No    Home Medications Prior to Admission medications   Medication Sig Start Date End Date Taking? Authorizing Provider  atorvastatin (LIPITOR) 10 MG tablet Take 1 tablet by mouth at bedtime. 10/17/20  Yes [provider]  latanoprost (XALATAN) 0.005 % ophthalmic solution  Place 1 drop into both eyes at bedtime.   Yes [provider]  losartan (COZAAR) 100 MG tablet Take 1 tablet by mouth daily. 10/25/20  Yes [provider]  metoprolol succinate (TOPROL-XL) 50 MG 24 hr tablet Take with or immediately following a meal. Patient taking differently: Take 50 mg by mouth daily. Take with or immediately following a meal. 05/28/20  Yes Tonia Ghent, MD  omeprazole (PRILOSEC OTC) 20 MG tablet Take 20 mg by mouth daily.   Yes [provider]  psyllium (REGULOID) 0.52 g capsule Take 0.52 g by mouth daily.   Yes [provider]   amlodipine-atorvastatin (CADUET) 10-10 MG tablet Take 1 tablet by mouth daily. Patient not taking: No sig reported 05/28/20   Tonia Ghent, MD  Ferrous Sulfate (IRON) 325 (65 FE) MG TABS Take 325 mg by mouth daily. Patient not taking: No sig reported 01/12/15   Tonia Ghent, MD  omeprazole (PRILOSEC) 20 MG capsule Take 1 capsule (20 mg total) by mouth daily. Patient not taking: Reported on 11/30/2020 10/22/16   Tonia Ghent, MD    Allergies    Lisinopril  Review of Systems   Review of Systems  Constitutional:  Positive for fatigue. Negative for fever.  HENT:  Negative for sore throat.   Eyes:  Negative for visual disturbance.  Respiratory:  Positive for cough and shortness of breath.   Cardiovascular:  Positive for leg swelling. Negative for chest pain.  Gastrointestinal:  Positive for abdominal pain. Negative for constipation, diarrhea, nausea and vomiting.  Genitourinary:  Negative for difficulty urinating.  Musculoskeletal:  Negative for back pain and neck stiffness.  Skin:  Negative for rash.  Neurological:  Positive for light-headedness. Negative for syncope and headaches.   Physical Exam Updated Vital Signs BP (!) 162/80 (BP Location: Right Arm)   Pulse (!) 51   Temp (!) 97.5 F (36.4 C) (Oral)   Resp 18   Ht 5\' 8"  (1.727 m)   Wt 76.7 kg   SpO2 96%   BMI 25.70 kg/m   Physical Exam Vitals and nursing note reviewed.  Constitutional:      General: He is not in acute distress.    Appearance: He is well-developed. He is not diaphoretic.  HENT:     Head: Normocephalic and atraumatic.  Eyes:     Conjunctiva/sclera: Conjunctivae normal.  Cardiovascular:     Rate and Rhythm: Normal rate and regular rhythm.     Heart sounds: Murmur heard.    No friction rub. No gallop.  Pulmonary:     Effort: Pulmonary effort is normal. Tachypnea present. No respiratory distress.     Breath sounds: Normal breath sounds. No wheezing or rales.     Comments: Decreased breath  sounds  Abdominal:     General: There is distension.     Palpations: Abdomen is soft. There is mass.     Tenderness: There is no abdominal tenderness. There is no guarding.     Comments: mass  Musculoskeletal:     Cervical back: Normal range of motion.     Right lower leg: Edema present.     Left lower leg: Edema present.  Skin:    General: Skin is warm and dry.  Neurological:     Mental Status: He is alert and oriented to person, place, and time.    ED Results / Procedures / Treatments   Labs (all labs ordered are listed, but only abnormal results are displayed) Labs Reviewed  CBC - Abnormal; Notable for the following components:      Result Value   RDW 15.6 (*)    All other components within normal limits  URINALYSIS, ROUTINE W REFLEX MICROSCOPIC - Abnormal; Notable for the following components:   APPearance CLOUDY (*)    Hgb urine dipstick MODERATE (*)    Protein, ur 100 (*)    Leukocytes,Ua LARGE (*)    WBC, UA >50 (*)    Bacteria, UA MANY (*)    All other components within normal limits  COMPREHENSIVE METABOLIC PANEL - Abnormal; Notable for the following components:   Sodium 131 (*)    Chloride 95 (*)    Glucose, Bld 100 (*)    Creatinine, Ser 2.87 (*)    Calcium 8.5 (*)    Albumin 2.7 (*)    Total Bilirubin 1.3 (*)    GFR, Estimated 20 (*)    All other components within normal limits  BRAIN NATRIURETIC PEPTIDE - Abnormal; Notable for the following components:   B Natriuretic Peptide 2,320.4 (*)    All other components within normal limits  TROPONIN I (HIGH SENSITIVITY) - Abnormal; Notable for the following components:   Troponin I (High Sensitivity) 47 (*)    All other components within normal limits  TROPONIN I (HIGH SENSITIVITY) - Abnormal; Notable for the following components:   Troponin I (High Sensitivity) 45 (*)    All other components within normal limits  RESP PANEL BY RT-PCR (FLU A&B, COVID) ARPGX2  GASTROINTESTINAL PANEL BY PCR, STOOL (REPLACES  STOOL CULTURE)  LIPASE, BLOOD  COMPREHENSIVE METABOLIC PANEL  CBC    EKG None  Radiology CT ABDOMEN PELVIS WO CONTRAST  Result Date: 11/30/2020 CLINICAL DATA:  Abdominal pain EXAM: CT ABDOMEN AND PELVIS WITHOUT CONTRAST TECHNIQUE: Multidetector CT imaging of the abdomen and pelvis was performed following the standard protocol without IV contrast. COMPARISON:  CT abdomen pelvis, 08/18/2014, MR abdomen, 09/01/2020 FINDINGS: Lower chest: Moderate bilateral pleural effusions, with associated dependent interlobular septal thickening and heterogeneous airspace opacity. Underlying emphysema. Cardiomegaly, coronary artery calcifications, and trace pleural effusion. Hepatobiliary: No solid liver abnormality is seen. Gallstones and sludge in the gallbladder. No gallbladder wall thickening, or biliary dilatation. Pancreas: Unremarkable. No pancreatic ductal dilatation or surrounding inflammatory changes. Spleen: Normal in size without significant abnormality. Adrenals/Urinary Tract: Adrenal glands are unremarkable. Numerous, extremely bilateral renal cysts which completely replace the normal renal parenchyma, lesion of the superior pole of the right kidney measuring at least 23 cm. There are large staghorn calculi of the left kidney, measuring at least 4.6 and 3.8 cm (series 5, image 85). No hydronephrosis. Bladder is unremarkable. Stomach/Bowel: Stomach is within normal limits. Appendix appears normal. No evidence of bowel wall thickening, distention, or inflammatory changes. Sigmoid diverticulosis. Vascular/Lymphatic: Aortic atherosclerosis. No enlarged abdominal or pelvic lymph nodes. Reproductive: Status post prostatectomy. Other: Small, fat containing right inguinal hernia. Anasarca. No abdominopelvic ascites. Musculoskeletal: No acute or significant osseous findings. IMPRESSION: 1. No acute non-contrast CT findings of the abdomen or pelvis to explain abdominal pain. 2. Moderate bilateral pleural effusions,  with associated dependent interlobular septal thickening and heterogeneous airspace opacity, consistent with edema and/or infection. 3. Cardiomegaly, coronary artery disease, and trace pleural effusion. 4. Cholelithiasis. 5. Numerous, extremely bilateral renal cysts which completely replace the normal renal parenchyma, lesion of the superior pole of the right kidney measuring at least 23 cm. These may be symptomatic due to extreme bulk. 6. There are large staghorn calculi of the left kidney, measuring at least  4.6 and 3.8 cm. No hydronephrosis. 7. Status post prostatectomy. Aortic Atherosclerosis (ICD10-I70.0). Electronically Signed   By: Eddie Candle M.D.   On: 11/30/2020 13:58   DG Chest Portable 1 View  Result Date: 11/30/2020 CLINICAL DATA:  87 y.o male c/o feeling off balanced x 1 month. Patient also c/o feeling tired all the time. Patient also c/o abdominal pain x 2 days ago. Patient is a poor historian. Pt. States he has had a cough for 2 days EXAM: PORTABLE CHEST 1 VIEW COMPARISON:  01/11/2015 FINDINGS: Hazy bilateral lung base opacities, right greater than left, with hemidiaphragms partly obscured, more evident on the left. Mid to upper lungs are clear. Cardiac silhouette is normal in size. No mediastinal or hilar masses. Possible small effusions.  No pneumothorax. Skeletal structures are grossly intact. IMPRESSION: 1. Mild lateral lung base airspace opacities consistent with multifocal pneumonia. No convincing pulmonary edema. Electronically Signed   By: Lajean Manes M.D.   On: 11/30/2020 11:54    Procedures Procedures   Medications Ordered in ED Medications  labetalol (NORMODYNE) injection 10 mg (10 mg Intravenous Not Given 11/30/20 1245)  atorvastatin (LIPITOR) tablet 10 mg (10 mg Oral Given 11/30/20 2103)  latanoprost (XALATAN) 0.005 % ophthalmic solution 1 drop (1 drop Both Eyes Given 11/30/20 2333)  heparin injection 5,000 Units (5,000 Units Subcutaneous Given 11/30/20 2104)  furosemide  (LASIX) injection 60 mg (60 mg Intravenous Given 11/30/20 2103)  acetaminophen (TYLENOL) tablet 650 mg (has no administration in time range)    Or  acetaminophen (TYLENOL) suppository 650 mg (has no administration in time range)  ondansetron (ZOFRAN) tablet 4 mg (has no administration in time range)    Or  ondansetron (ZOFRAN) injection 4 mg (has no administration in time range)  hydrALAZINE (APRESOLINE) injection 10 mg (10 mg Intravenous Given 11/30/20 1903)  metoprolol succinate (TOPROL-XL) 24 hr tablet 50 mg (has no administration in time range)  fentaNYL (SUBLIMAZE) injection 50 mcg (50 mcg Intravenous Given 11/30/20 1246)  hydrALAZINE (APRESOLINE) injection 10 mg (10 mg Intravenous Given 11/30/20 1257)  furosemide (LASIX) injection 60 mg (60 mg Intravenous Given 11/30/20 1405)    ED Course  I have reviewed the triage vital signs and the nursing notes.  Pertinent labs & imaging results that were available during my care of the patient were reviewed by me and considered in my medical decision making (see chart for details).    MDM Rules/Calculators/A&P                           87 year old male with history of hypertension, hyperlipidemia, arthritis, presents with concern for abdominal pain and distention for 2 days, and increasing shortness of breath over the last week.  CT abdomen pelvis shows numerous very large renal cysts one measuring up to 23cm, staghorn calculi, pleural effusions, cardiomegaly. UA with suspected infection.  No sign of AAA, symptoms not consistent with disseciton.   CXR< BNP, troponin, exam all consistent with CHF/volume overload. Suspect this is etiology of symptoms. XR shows possible multifocal pneumonia but history and exam more consistent with CHF, no leukocytosis, doubt pneumonia.   Given lasix, medications for severe hypertension, placed on 4L O2 for hypoxia, will admit for further care.   Final Clinical Impression(s) / ED Diagnoses Final diagnoses:   Shortness of breath  Acute on chronic congestive heart failure, unspecified heart failure type ([facility])  Acute respiratory failure with hypoxia ([facility])  Generalized abdominal pain  Multiple renal cysts  Hypertensive urgency    Rx / DC Orders ED Discharge Orders     None        Gareth Morgan, MD 12/01/20 [phone]

## 2020-11-30 NOTE — ED Notes (Signed)
Provider at the bedside to evaluate. 

## 2020-11-30 NOTE — ED Notes (Signed)
4W nurse called to receive report on patient. Per RN she will call me back

## 2020-11-30 NOTE — ED Triage Notes (Addendum)
Patient c/o feeling off balanced x 1 month. Patient also c/o feeling tired all the time.  Patient also c/o abdominal pain x 2 days ago.  Patient is a poor historian.

## 2020-11-30 NOTE — H&P (Addendum)
History and Physical    [name]:[id number] DOB: Oct 30, 1928 DOA: 11/30/2020  PCP: Tonia Ghent, MD  Patient coming from: Home  Chief Complaint: abdominal pain  HPI: Rick [name] is a 87 y.o. male with medical history significant of prostate CA, HTN, PCKD. Presenting with abdominal pain. Started 3 days ago. Noticed it first in the lower quadrants. It was an achy pain that wrapped around to his flanks. He tried some baking soda, but it didn't help. He's noticed increased fatigue, weakness, and orthopnea during that time. His symptoms worsened last night, so he decided to come to the ED. He denies any other aggravating or alleviating factors.    ED Course: He was found to have an elevated BNP. CT exam was positive for significant renal cyst load from his PCKD, renal stones, moderate b/l pleural effusions w/ pulm edema. He was given 60mg  IV lasix. His was noted to have elevated SBPs. He was given NTG, labetolol and hydralazine. TRH was called for admission.   Review of Systems:  Denies CP, palpitations, N/V, fever, lightheadedness, dizziness. Reports diarrhea, ble edema. Review of systems is otherwise negative for all not mentioned in HPI.   PMHx Past Medical History:  Diagnosis Date   Arthritis    GERD (gastroesophageal reflux disease)    History of colon polyps    History of prostate cancer    Hyperlipidemia    Hypertension    Kidney cysts    bilateral.  Renal US    PSHx Past Surgical History:  Procedure Laterality Date   COLONOSCOPY     CYSTOSCOPY  02/12/99   biopsy   ESOPHAGEAL BANDING N/A 08/04/2012   Procedure: ESOPHAGEAL BANDING;  Surgeon: Inda Castle, MD;  Location: [facility];  Service: Endoscopy;  Laterality: N/A;   ESOPHAGOGASTRODUODENOSCOPY N/A 08/04/2012   Procedure: ESOPHAGOGASTRODUODENOSCOPY (EGD);  Surgeon: Inda Castle, MD;  Location: [facility];  Service: Endoscopy;  Laterality: N/A;   INGUINAL HERNIA REPAIR  02/1999   Dr. Reece Agar    PROSTATECTOMY  02/1999    [facility]  reports that he quit smoking about 19 years ago. His smoking use included cigarettes. His smokeless tobacco use includes chew. He reports current alcohol use. He reports that he does not use drugs.  Allergies  Allergen Reactions   Lisinopril     cough    FamHx Family History  Problem Relation Age of Onset   Bone cancer Sister    Stomach cancer Brother    Stomach cancer Brother    Lung cancer Sister    Colon cancer Sister    Stroke Mother    Colon cancer Other        nephew    Prior to Admission medications   Medication Sig Start Date End Date Taking? Authorizing Provider  amlodipine-atorvastatin (CADUET) 10-10 MG tablet Take 1 tablet by mouth daily. 05/28/20   Tonia Ghent, MD  Ferrous Sulfate (IRON) 325 (65 FE) MG TABS Take 325 mg by mouth daily. 01/12/15   Tonia Ghent, MD  latanoprost (XALATAN) 0.005 % ophthalmic solution Place 1 drop into both eyes at bedtime.    [provider]  metoprolol succinate (TOPROL-XL) 50 MG 24 hr tablet Take with or immediately following a meal. 05/28/20   Tonia Ghent, MD  omeprazole (PRILOSEC) 20 MG capsule Take 1 capsule (20 mg total) by mouth daily. 10/22/16   Tonia Ghent, MD  timolol (BETIMOL) 0.25 % ophthalmic solution Place 1-2 drops into both eyes every  morning.    [provider]    Physical Exam: Vitals:   11/30/20 1230 11/30/20 1300 11/30/20 1345 11/30/20 1400  BP: (!) 203/110 (!) 211/106 (!) 196/102 (!) 185/99  Pulse: (!) 59 66 72 67  Resp: 19 15 19  (!) 21  Temp:      TempSrc:      SpO2: 99% 99% 98% 99%  Weight:      Height:        General: 87 y.o. male resting in bed in NAD Eyes: PERRL, normal sclera ENMT: Nares patent w/o discharge, orophaynx clear, dentition normal, ears w/o discharge/lesions/ulcers Neck: thin, trachea midline Cardiovascular: RRR, +S1, S2, no m/g/r, equal pulses throughout Respiratory: CTABL, no r/r, 2/6 SEM, slightly increased WOB GI:  BS+, distended w/ slight TTP in LLQ, soft MSK: No c/c; 2-3+ pitting edema BLE Skin: No rashes, bruises, ulcerations noted Neuro: A&O x 3, no focal deficits Psyc: Appropriate interaction and affect, calm/cooperative  Labs on Admission: I have personally reviewed following labs and imaging studies  CBC: Recent Labs  Lab 11/30/20 1019  WBC 8.1  HGB 13.4  HCT 40.1  MCV 92.4  PLT 341   Basic Metabolic Panel: Recent Labs  Lab 11/30/20 1107  NA 131*  K 3.6  CL 95*  CO2 25  GLUCOSE 100*  BUN 20  CREATININE 2.87*  CALCIUM 8.5*   GFR: Estimated Creatinine Clearance: 16.9 mL/min (A) (by C-G formula based on SCr of 2.87 mg/dL (H)). Liver Function Tests: Recent Labs  Lab 11/30/20 1107  AST 22  ALT 14  ALKPHOS 61  BILITOT 1.3*  PROT 6.9  ALBUMIN 2.7*   Recent Labs  Lab 11/30/20 1107  LIPASE 37   No results for input(s): AMMONIA in the last 168 hours. Coagulation Profile: No results for input(s): INR, PROTIME in the last 168 hours. Cardiac Enzymes: No results for input(s): CKTOTAL, CKMB, CKMBINDEX, TROPONINI in the last 168 hours. BNP (last 3 results) No results for input(s): PROBNP in the last 8760 hours. HbA1C: No results for input(s): HGBA1C in the last 72 hours. CBG: No results for input(s): GLUCAP in the last 168 hours. Lipid Profile: No results for input(s): CHOL, HDL, LDLCALC, TRIG, CHOLHDL, LDLDIRECT in the last 72 hours. Thyroid Function Tests: No results for input(s): TSH, T4TOTAL, FREET4, T3FREE, THYROIDAB in the last 72 hours. Anemia Panel: No results for input(s): VITAMINB12, FOLATE, FERRITIN, TIBC, IRON, RETICCTPCT in the last 72 hours. Urine analysis:    Component Value Date/Time   COLORURINE YELLOW 11/30/2020 1129   APPEARANCEUR CLOUDY (A) 11/30/2020 1129   LABSPEC 1.010 11/30/2020 1129   PHURINE 7.0 11/30/2020 1129   GLUCOSEU NEGATIVE 11/30/2020 1129   HGBUR MODERATE (A) 11/30/2020 1129   BILIRUBINUR NEGATIVE 11/30/2020 1129   KETONESUR  NEGATIVE 11/30/2020 1129   PROTEINUR 100 (A) 11/30/2020 1129   UROBILINOGEN 0.2 01/11/2015 2204   NITRITE NEGATIVE 11/30/2020 1129   LEUKOCYTESUR LARGE (A) 11/30/2020 1129    Radiological Exams on Admission: CT ABDOMEN PELVIS WO CONTRAST  Result Date: 11/30/2020 CLINICAL DATA:  Abdominal pain EXAM: CT ABDOMEN AND PELVIS WITHOUT CONTRAST TECHNIQUE: Multidetector CT imaging of the abdomen and pelvis was performed following the standard protocol without IV contrast. COMPARISON:  CT abdomen pelvis, 08/18/2014, MR abdomen, 09/01/2020 FINDINGS: Lower chest: Moderate bilateral pleural effusions, with associated dependent interlobular septal thickening and heterogeneous airspace opacity. Underlying emphysema. Cardiomegaly, coronary artery calcifications, and trace pleural effusion. Hepatobiliary: No solid liver abnormality is seen. Gallstones and sludge in the gallbladder. No gallbladder  wall thickening, or biliary dilatation. Pancreas: Unremarkable. No pancreatic ductal dilatation or surrounding inflammatory changes. Spleen: Normal in size without significant abnormality. Adrenals/Urinary Tract: Adrenal glands are unremarkable. Numerous, extremely bilateral renal cysts which completely replace the normal renal parenchyma, lesion of the superior pole of the right kidney measuring at least 23 cm. There are large staghorn calculi of the left kidney, measuring at least 4.6 and 3.8 cm (series 5, image 85). No hydronephrosis. Bladder is unremarkable. Stomach/Bowel: Stomach is within normal limits. Appendix appears normal. No evidence of bowel wall thickening, distention, or inflammatory changes. Sigmoid diverticulosis. Vascular/Lymphatic: Aortic atherosclerosis. No enlarged abdominal or pelvic lymph nodes. Reproductive: Status post prostatectomy. Other: Small, fat containing right inguinal hernia. Anasarca. No abdominopelvic ascites. Musculoskeletal: No acute or significant osseous findings. IMPRESSION: 1. No acute  non-contrast CT findings of the abdomen or pelvis to explain abdominal pain. 2. Moderate bilateral pleural effusions, with associated dependent interlobular septal thickening and heterogeneous airspace opacity, consistent with edema and/or infection. 3. Cardiomegaly, coronary artery disease, and trace pleural effusion. 4. Cholelithiasis. 5. Numerous, extremely bilateral renal cysts which completely replace the normal renal parenchyma, lesion of the superior pole of the right kidney measuring at least 23 cm. These may be symptomatic due to extreme bulk. 6. There are large staghorn calculi of the left kidney, measuring at least 4.6 and 3.8 cm. No hydronephrosis. 7. Status post prostatectomy. Aortic Atherosclerosis (ICD10-I70.0). Electronically Signed   By: Eddie Candle M.D.   On: 11/30/2020 13:58   DG Chest Portable 1 View  Result Date: 11/30/2020 CLINICAL DATA:  87 y.o male c/o feeling off balanced x 1 month. Patient also c/o feeling tired all the time. Patient also c/o abdominal pain x 2 days ago. Patient is a poor historian. Pt. States he has had a cough for 2 days EXAM: PORTABLE CHEST 1 VIEW COMPARISON:  01/11/2015 FINDINGS: Hazy bilateral lung base opacities, right greater than left, with hemidiaphragms partly obscured, more evident on the left. Mid to upper lungs are clear. Cardiac silhouette is normal in size. No mediastinal or hilar masses. Possible small effusions.  No pneumothorax. Skeletal structures are grossly intact. IMPRESSION: 1. Mild lateral lung base airspace opacities consistent with multifocal pneumonia. No convincing pulmonary edema. Electronically Signed   By: Lajean Manes M.D.   On: 11/30/2020 11:54    EKG: Independently reviewed. Sinus, RBBB, no st elevations  Assessment/Plan Acute CHF, type unknown     - admit to inpatient, progressive     - lasix 60mg  IV BID, fluid restriction to 1200cc     - continue home metoprolol     - daily wts, I&O     - echo     - new Dx; cards  consulted, appreciate assistance  HTN urgency     - SBP initially in 200s; given PRNs but not holding     - started on cardene gtt, but had huge response; stop cardene     - will resume home metoprolol and continue IV lasix  Bilateral pleural effusions Pulm edema     - as a fxn of above     - wean O2 as able, continue lasix  PCKD     - evidence of this back to 2015 at least     - significant disease now     - Follows w/ urology (Dr. Louis Meckel)     - spoke with on-call urology; reasonable to continue outpt follow up  AKI on CKD4     - watch nephrotoxins     -  will be getting lasix     - obviously his PCKD is having a decent role here, monitor  HLD     - continue statin  Hx of prostate cancer     - continue outpt follow up  Glaucoma     - continue eye drops  Diarrhea     - check GI PCR  DVT prophylaxis: heparin  Code Status: FULL  Family Communication: w/ wife at bedside  Consults called: cardiology   Status is: Inpatient  Remains inpatient appropriate because:Inpatient level of care appropriate due to severity of illness  Dispo: The patient is from: Home              Anticipated d/c is to: Home              Patient currently is not medically stable to d/c.   Difficult to place patient No  Time spent coordinating admission: 70 minutes  [place] [facility]  If 7PM-7AM, please contact night-coverage [email or web address]  11/30/2020, 2:35 PM

## 2020-12-01 ENCOUNTER — Inpatient Hospital Stay (HOSPITAL_COMMUNITY): Payer: Medicare PPO

## 2020-12-01 DIAGNOSIS — N189 Chronic kidney disease, unspecified: Secondary | ICD-10-CM

## 2020-12-01 DIAGNOSIS — J9601 Acute respiratory failure with hypoxia: Secondary | ICD-10-CM

## 2020-12-01 DIAGNOSIS — I160 Hypertensive urgency: Secondary | ICD-10-CM

## 2020-12-01 DIAGNOSIS — Q613 Polycystic kidney, unspecified: Secondary | ICD-10-CM

## 2020-12-01 DIAGNOSIS — N179 Acute kidney failure, unspecified: Secondary | ICD-10-CM

## 2020-12-01 DIAGNOSIS — I5031 Acute diastolic (congestive) heart failure: Secondary | ICD-10-CM

## 2020-12-01 DIAGNOSIS — N184 Chronic kidney disease, stage 4 (severe): Secondary | ICD-10-CM

## 2020-12-01 DIAGNOSIS — I509 Heart failure, unspecified: Secondary | ICD-10-CM

## 2020-12-01 DIAGNOSIS — R197 Diarrhea, unspecified: Secondary | ICD-10-CM

## 2020-12-01 DIAGNOSIS — H409 Unspecified glaucoma: Secondary | ICD-10-CM

## 2020-12-01 DIAGNOSIS — I5023 Acute on chronic systolic (congestive) heart failure: Secondary | ICD-10-CM

## 2020-12-01 DIAGNOSIS — I700 Atherosclerosis of aorta: Secondary | ICD-10-CM

## 2020-12-01 HISTORY — DX: Acute respiratory failure with hypoxia: J96.01

## 2020-12-01 LAB — COMPREHENSIVE METABOLIC PANEL
ALT: 24 U/L (ref 0–44)
AST: 48 U/L — ABNORMAL HIGH (ref 15–41)
Albumin: 2.6 g/dL — ABNORMAL LOW (ref 3.5–5.0)
Alkaline Phosphatase: 103 U/L (ref 38–126)
Anion gap: 13 (ref 5–15)
BUN: 26 mg/dL — ABNORMAL HIGH (ref 8–23)
CO2: 25 mmol/L (ref 22–32)
Calcium: 8.2 mg/dL — ABNORMAL LOW (ref 8.9–10.3)
Chloride: 94 mmol/L — ABNORMAL LOW (ref 98–111)
Creatinine, Ser: 3.12 mg/dL — ABNORMAL HIGH (ref 0.61–1.24)
GFR, Estimated: 18 mL/min — ABNORMAL LOW (ref >60)
Glucose, Bld: 70 mg/dL (ref 70–99)
Potassium: 3.4 mmol/L — ABNORMAL LOW (ref 3.5–5.1)
Sodium: 132 mmol/L — ABNORMAL LOW (ref 135–145)
Total Bilirubin: 1.9 mg/dL — ABNORMAL HIGH (ref 0.3–1.2)
Total Protein: 6.7 g/dL (ref 6.5–8.1)

## 2020-12-01 LAB — ECHOCARDIOGRAM COMPLETE
AR max vel: 2.31 cm2
AV Area VTI: 2.31 cm2
AV Area mean vel: 2.22 cm2
AV Mean grad: 8 mmHg
AV Peak grad: 14 mmHg
Ao pk vel: 1.87 m/s
Height: 68 in
P 1/2 time: 280 msec
S' Lateral: 2.8 cm
Weight: 2557.34 oz

## 2020-12-01 LAB — CBC
HCT: 37.1 % — ABNORMAL LOW (ref 39.0–52.0)
Hemoglobin: 12.2 g/dL — ABNORMAL LOW (ref 13.0–17.0)
MCH: 30.3 pg (ref 26.0–34.0)
MCHC: 32.9 g/dL (ref 30.0–36.0)
MCV: 92.1 fL (ref 80.0–100.0)
Platelets: 169 10*3/uL (ref 150–400)
RBC: 4.03 MIL/uL — ABNORMAL LOW (ref 4.22–5.81)
RDW: 15.9 % — ABNORMAL HIGH (ref 11.5–15.5)
WBC: 11.8 10*3/uL — ABNORMAL HIGH (ref 4.0–10.5)
nRBC: 0 % (ref 0.0–0.2)

## 2020-12-01 MED ORDER — HYDRALAZINE HCL 10 MG PO TABS
10.0000 mg | ORAL_TABLET | Freq: Three times a day (TID) | ORAL | Status: DC
  Administered 2020-12-01 – 2020-12-02 (×2): 10 mg via ORAL
  Filled 2020-12-01 (×2): qty 1

## 2020-12-01 MED ORDER — AMLODIPINE BESYLATE 5 MG PO TABS
5.0000 mg | ORAL_TABLET | Freq: Every day | ORAL | Status: DC
  Administered 2020-12-02: 5 mg via ORAL
  Filled 2020-12-01: qty 1

## 2020-12-01 NOTE — Assessment & Plan Note (Deleted)
--  follow BMP

## 2020-12-01 NOTE — Hospital Course (Signed)
87 year old man presenting with abdominal pain, diarrhea, lower extremity edema, orthopnea.  Admitted for acute CHF presumed, hypertensive urgency, AKI.

## 2020-12-01 NOTE — Assessment & Plan Note (Signed)
continue statin

## 2020-12-01 NOTE — Assessment & Plan Note (Signed)
--  continue gtts

## 2020-12-01 NOTE — Assessment & Plan Note (Signed)
--  resolved, etiology and significance unclear

## 2020-12-01 NOTE — Progress Notes (Signed)
  Echocardiogram 2D Echocardiogram has been performed.  Rick [name] 12/01/2020, 10:00 AM

## 2020-12-01 NOTE — Assessment & Plan Note (Addendum)
--   No significant peripheral edema at this time.  Asymptomatic.  Discussed with patient, wife and daughter at bedside limitation of salt in diet and weighing daily. -- Discussed with Dr. Harrington Challenger.  No diuretic at this time.  Continue amlodipine and metoprolol on discharge. -- Close outpatient follow-up with cardiology.

## 2020-12-01 NOTE — Assessment & Plan Note (Addendum)
--   Versus progression of chronic kidney disease.  Creatinine slightly better today.  No diuretic at this point.  Hold Cozaar for now until follow-up. -- Continue to follow with Dr. Candiss Norse as an outpatient.

## 2020-12-01 NOTE — Assessment & Plan Note (Addendum)
--  follow-up with urology and nephrology as an outpatient

## 2020-12-01 NOTE — Assessment & Plan Note (Addendum)
--  resolved --added amlodipine, continue metoprolol.

## 2020-12-01 NOTE — Progress Notes (Addendum)
PROGRESS NOTE  Rick [name] QJF:[id number] DOB: [birth year] DOA: 11/30/2020 PCP: Tonia Ghent, MD  Brief History   87 year old man presenting with abdominal pain, diarrhea, lower extremity edema, orthopnea.  Admitted for acute CHF presumed, hypertensive urgency, AKI.  A & P  * Acute CHF (congestive heart failure) (HCC) -- Still has peripheral edema.  Breathing better today.  Continue diuresis.  Follow BMP. -- Follow-up echocardiogram and cardiology evaluation. -- Can continue beta-blocker  AKI (acute kidney injury) ([place]) superimposed on CKD stage IV --suspect secondary to relative hypoperfusion of kidneys --creatinine worse today, may need to back off Lasix unless better tomorrow  CKD (chronic kidney disease), stage IV ([place]) --follow BMP  Polycystic kidney disease --follow-up with urology as an outpatient  Diarrhea --resolved, etiology and significance unclear  Glaucoma --continue gtts  Acute hypoxemic respiratory failure ([place]) --resolved, secondary to CHF  Hypertensive urgency --resolved  Aortic atherosclerosis ([place]) --continue statin  Disposition Plan:  Discussion:   Status is: Inpatient  Remains inpatient appropriate because:IV treatments appropriate due to intensity of illness or inability to take PO and Inpatient level of care appropriate due to severity of illness  Dispo: The patient is from: Home              Anticipated d/c is to: Home              Patient currently is not medically stable to d/c.   Difficult to place patient No  DVT prophylaxis: heparin injection 5,000 Units Start: 11/30/20 2200   Code Status: Full Code Level of care: Progressive Family Communication: none  Murray Hodgkins, MD  [facility] Direct contact: see [email or web address] (further directions at bottom of note if needed) 7PM-7AM contact night coverage as at bottom of note 12/01/2020, 12:50 PM  LOS: 1 day   Significant Hospital Events   6/25 admit for CHF   Consults:   Cardiology    Procedures:    Interval History/Subjective  CC: f/u swelling  Feels better, breathing better No diarrhea now (but had x1 week) No abd pain now Still w/ LE edema   Objective   Vitals:  Vitals:   11/30/20 2123 12/01/20 0443  BP: (!) 162/80 (!) 163/84  Pulse: (!) 51 77  Resp: 18 18  Temp: (!) 97.5 F (36.4 C) 98.5 F (36.9 C)  SpO2: 96% 97%    Exam: Physical Exam Constitutional:      General: He is not in acute distress.    Appearance: Normal appearance.     Comments: Sitting in chair.  Cardiovascular:     Rate and Rhythm: Normal rate and regular rhythm.     Heart sounds: No murmur heard.    Comments: Telemetry SR Pulmonary:     Effort: Pulmonary effort is normal. No respiratory distress.     Breath sounds: Normal breath sounds. No wheezing or rales.  Abdominal:     Palpations: Abdomen is soft.     Tenderness: There is no abdominal tenderness. There is no guarding.  Neurological:     Mental Status: He is alert.  Psychiatric:        Mood and Affect: Mood normal.        Behavior: Behavior normal.    I have personally reviewed the labs and other data, making special note of:   Today's Data  UOP 1200 K+ 3.4 Creatinine up to 3.12 AST up to 48 CBC noted   Scheduled Meds:  atorvastatin  10 mg Oral QHS   furosemide  60  mg Intravenous BID   heparin  5,000 Units Subcutaneous Q8H   labetalol  10 mg Intravenous Once   latanoprost  1 drop Both Eyes QHS   metoprolol succinate  50 mg Oral Daily   Continuous Infusions:  Principal Problem:   Acute CHF (congestive heart failure) (HCC) Active Problems:   AKI (acute kidney injury) ([place]) superimposed on CKD stage IV   Polycystic kidney disease   CKD (chronic kidney disease), stage IV (HCC)   Glaucoma   Diarrhea   Aortic atherosclerosis ([place])   Hypertensive urgency   Acute hypoxemic respiratory failure ([place])   LOS: 1 day   How to contact the [facility] Attending or Consulting provider 7A - 7P or  covering provider during after hours [place], for this patient?  Check the care team in [facility] and look for a) attending/consulting [facility] provider listed and b) the [facility] team listed Log into [email or web address] and use [place]'s universal password to access. If you do not have the password, please contact the hospital operator. Locate the [facility] provider you are looking for under [facility] and page to a number that you can be directly reached. If you still have difficulty reaching the provider, please page the [facility] (Director on Call) for the Hospitalists listed on amion for assistance.

## 2020-12-01 NOTE — Assessment & Plan Note (Signed)
--  resolved, secondary to CHF

## 2020-12-01 NOTE — Consult Note (Signed)
Cardiology Consultation:   Patient ID: Rick [name] MRN: [id number]; DOB: [birth year]  Admit date: 11/30/2020 Date of Consult: 12/01/2020  PCP:  Tonia Ghent, MD   [facility] Providers Cardiologist:  None   { Patient Profile:   Rick [name] is a 88 y.o. male with a hx of HTN who is being seen 12/01/2020 for the evaluation of edema, CHF at the request of Dr Sarajane Jews.  History of Present Illness:   Rick [name] an 87 yo with hx of prostate Ca, HTN, CKD.  The pt was admitted yesterday with abdominal pain(lower then around flanks)   Patient also noted increased fatigue, weakness and orthopnea  Went to [facility] ED In ED he was found to be severely HTN (203/110)  CT showed mod bilateral pleural effusions with pulmonary edema.  Pt admitted for control of HTN and CHF   Cardiology consulted    The pt deneis CP   No palpitations   No dizziness  Has had LE swelling for years    Denies SOB and then says he was a little SOB     Past Medical History:  Diagnosis Date   Arthritis    GERD (gastroesophageal reflux disease)    History of colon polyps    History of prostate cancer    Hyperlipidemia    Hypertension    Kidney cysts    bilateral.  Renal US    Past Surgical History:  Procedure Laterality Date   COLONOSCOPY     CYSTOSCOPY  02/12/99   biopsy   ESOPHAGEAL BANDING N/A 08/04/2012   Procedure: ESOPHAGEAL BANDING;  Surgeon: Inda Castle, MD;  Location: [facility] ENDOSCOPY;  Service: Endoscopy;  Laterality: N/A;   ESOPHAGOGASTRODUODENOSCOPY N/A 08/04/2012   Procedure: ESOPHAGOGASTRODUODENOSCOPY (EGD);  Surgeon: Inda Castle, MD;  Location: [facility];  Service: Endoscopy;  Laterality: N/A;   INGUINAL HERNIA REPAIR  02/1999   Dr. Reece Agar   PROSTATECTOMY  02/1999     Home Medications:  Prior to Admission medications   Medication Sig Start Date End Date Taking? Authorizing Provider  atorvastatin (LIPITOR) 10 MG tablet Take 1 tablet by mouth at bedtime. 10/17/20  Yes [provider]  latanoprost (XALATAN) 0.005 % ophthalmic solution Place 1 drop into both eyes at bedtime.   Yes [provider]  losartan (COZAAR) 100 MG tablet Take 1 tablet by mouth daily. 10/25/20  Yes [provider]  metoprolol succinate (TOPROL-XL) 50 MG 24 hr tablet Take with or immediately following a meal. Patient taking differently: Take 50 mg by mouth daily. Take with or immediately following a meal. 05/28/20  Yes Tonia Ghent, MD  omeprazole (PRILOSEC OTC) 20 MG tablet Take 20 mg by mouth daily.   Yes [provider]  psyllium (REGULOID) 0.52 g capsule Take 0.52 g by mouth daily.   Yes [provider]  amlodipine-atorvastatin (CADUET) 10-10 MG tablet Take 1 tablet by mouth daily. Patient not taking: No sig reported 05/28/20   Tonia Ghent, MD  Ferrous Sulfate (IRON) 325 (65 FE) MG TABS Take 325 mg by mouth daily. Patient not taking: No sig reported 01/12/15   Tonia Ghent, MD  omeprazole (PRILOSEC) 20 MG capsule Take 1 capsule (20 mg total) by mouth daily. Patient not taking: Reported on 11/30/2020 10/22/16   Tonia Ghent, MD    Inpatient Medications: Scheduled Meds:  atorvastatin  10 mg Oral QHS   furosemide  60 mg Intravenous BID   heparin  5,000 Units Subcutaneous  Q8H   labetalol  10 mg Intravenous Once   latanoprost  1 drop Both Eyes QHS   metoprolol succinate  50 mg Oral Daily   Continuous Infusions:  PRN Meds: acetaminophen **OR** acetaminophen, hydrALAZINE, ondansetron **OR** ondansetron (ZOFRAN) IV  Allergies:    Allergies  Allergen Reactions   Lisinopril     cough    Social History:   Social History   Socioeconomic History   Marital status: Married    Spouse name: Not on file   Number of children: 6   Years of education: Not on file   Highest education level: Not on file  Occupational History   Occupation: retired- Patent attorney, Biochemist, clinical    Comment: at [employer], Neurosurgeon: RETIRED  Tobacco Use   Smoking status: Former    Pack years: 0.00    Types: Cigarettes    Quit date: 08/26/2001    Years since quitting: 19.2   Smokeless tobacco: Current    Types: Chew   Tobacco comments:    quit about 10 years or more  Vaping Use   Vaping Use: Never used  Substance and Sexual Activity   Alcohol use: Yes   Drug use: No   Sexual activity: Not Currently  Other Topics Concern   Not on file  Social History Narrative   Retired Psychologist, sport and exercise, worked at [employer], [place]   Married 1955   6 kids initially, oldest daughter died after surgery   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, 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   Social Determinants of Radio broadcast assistant Strain: Not on Art therapist Insecurity: Not on file  Transportation Needs: Not on file  Physical Activity: Not on file  Stress: Not on file  Social Connections: Not on file  Intimate Partner Violence: Not on file    Family History:    Family History  Problem Relation Age of Onset   Bone cancer Sister    Stomach cancer Brother    Stomach cancer Brother    Lung cancer Sister    Colon cancer Sister    Stroke Mother    Colon cancer Other        nephew     ROS:  Please see the history of present illness.   All other ROS reviewed and negative.     Physical Exam/Data:   Vitals:   11/30/20 1848 11/30/20 2123 12/01/20 0443 12/01/20 0500  BP: (!) 184/92 (!) 162/80 (!) 163/84   Pulse: (!) 53 (!) 51 77   Resp:  18 18   Temp:  (!) 97.5 F (36.4 C) 98.5 F (36.9 C)   TempSrc:  Oral Oral   SpO2:  96% 97%   Weight:    72.5 kg  Height:        Intake/Output Summary (Last 24 hours) at 12/01/2020 0756 Last data filed at 12/01/2020 0300 Gross per 24 hour  Intake --  Output 1200 ml  Net -1200 ml   Last 3 Weights 12/01/2020 11/30/2020 05/28/2020  Weight (lbs) 159 lb 13.3 oz 169 lb 162 lb 11.2 oz  Weight (kg) 72.5 kg 76.658 kg 73.8 kg     Body mass index is 24.3 kg/m.  General:  Well nourished, well developed, in no  acute distress HEENT: normal Neck: no JVD Endocrine:  No thryomegaly Vascular: No carotid bruits; FA pulses 2+ bilaterally without bruits  Cardiac:  RRR   Gr III/VI blowing holosystolic murmur heard best at apex   Lungs:  clear to auscultation bilaterally, no wheezing.  Very mild rales at bses Abd: soft, nontender, no hepatomegaly  Ext: 1-2+ LE edema Musculoskeletal:  No deformities, BUE and BLE strength normal and equal Skin: warm and dry  Neuro:  CNs 2-12 intact, no focal abnormalities noted Psych:  Normal affect   EKG:  The EKG was personally reviewed and demonstrates:  SR 73  First degree AV block  PR 308 msec   RBBB  LAFB   Poss Lateral MI with Q in V6    Telemetry:  Telemetry was personally reviewed and demonstrates:  SR  Relevant CV Studies: Moderate to severe mitral regurgitation is present, likely severe. The jet is eccentric and anteriorly directed. There is likely pulmonary vein flow reversal in systole in the right inferior pulmonary vein. There is splay artifact present, indicating at least moderate severity. The jet appears to wrap around the LA, indicative of severe MR. RVSP elevated. There is no obvious flail segment, but suspect this is not well seen. Would recommend a TEE for characterization of the MR. The mitral valve is grossly normal. Moderate to severe mitral valve regurgitation. No evidence of mitral stenosis. 2. Left ventricular ejection fraction, by estimation, is 60 to 65%. The left ventricle has normal function. The left ventricle has no regional wall motion abnormalities. There is severe asymmetric left ventricular hypertrophy of the basal-septal segment. Indeterminate diastolic filling due to E-A fusion. 3. Right ventricular systolic function is normal. The right ventricular size is normal. There is mildly elevated pulmonary artery systolic pressure. The estimated right ventricular systolic pressure is 88.4 mmHg. 4. The aortic valve is tricuspid. Aortic  valve regurgitation is mild to moderate. Mild aortic valve sclerosis is present, with no evidence of aortic valve stenosis. 5. The inferior vena cava is normal in size with greater than 50% respiratory variability, suggesting right atrial pressure of 3 mmHg. 6. There is a large cystic structure located inferior to the liver, which could represent a cyst. Would recommend dedicated liver imaging.  Laboratory Data:  High Sensitivity Troponin:   Recent Labs  Lab 11/30/20 1107 11/30/20 1319  TROPONINIHS 47* 45*     Chemistry Recent Labs  Lab 11/30/20 1107  NA 131*  K 3.6  CL 95*  CO2 25  GLUCOSE 100*  BUN 20  CREATININE 2.87*  CALCIUM 8.5*  GFRNONAA 20*  ANIONGAP 11    Recent Labs  Lab 11/30/20 1107  PROT 6.9  ALBUMIN 2.7*  AST 22  ALT 14  ALKPHOS 61  BILITOT 1.3*   Hematology Recent Labs  Lab 11/30/20 1019  WBC 8.1  RBC 4.34  HGB 13.4  HCT 40.1  MCV 92.4  MCH 30.9  MCHC 33.4  RDW 15.6*  PLT 189   BNP Recent Labs  Lab 11/30/20 1107  BNP 2,320.4*    DDimer No results for input(s): DDIMER in the last 168 hours.   Radiology/Studies:  CT ABDOMEN PELVIS WO CONTRAST  Result Date: 11/30/2020 CLINICAL DATA:  Abdominal pain EXAM: CT ABDOMEN AND PELVIS WITHOUT CONTRAST TECHNIQUE: Multidetector CT imaging of the abdomen and pelvis was performed following the standard protocol without IV contrast. COMPARISON:  CT abdomen pelvis, 08/18/2014, MR abdomen, 09/01/2020 FINDINGS: Lower chest: Moderate bilateral pleural effusions, with associated dependent interlobular septal thickening and heterogeneous airspace opacity. Underlying emphysema. Cardiomegaly, coronary artery calcifications, and trace pleural effusion. Hepatobiliary: No solid liver abnormality is seen. Gallstones and sludge in the gallbladder. No gallbladder wall thickening, or biliary dilatation. Pancreas: Unremarkable. No pancreatic ductal dilatation  or surrounding inflammatory changes. Spleen: Normal in size  without significant abnormality. Adrenals/Urinary Tract: Adrenal glands are unremarkable. Numerous, extremely bilateral renal cysts which completely replace the normal renal parenchyma, lesion of the superior pole of the right kidney measuring at least 23 cm. There are large staghorn calculi of the left kidney, measuring at least 4.6 and 3.8 cm (series 5, image 85). No hydronephrosis. Bladder is unremarkable. Stomach/Bowel: Stomach is within normal limits. Appendix appears normal. No evidence of bowel wall thickening, distention, or inflammatory changes. Sigmoid diverticulosis. Vascular/Lymphatic: Aortic atherosclerosis. No enlarged abdominal or pelvic lymph nodes. Reproductive: Status post prostatectomy. Other: Small, fat containing right inguinal hernia. Anasarca. No abdominopelvic ascites. Musculoskeletal: No acute or significant osseous findings. IMPRESSION: 1. No acute non-contrast CT findings of the abdomen or pelvis to explain abdominal pain. 2. Moderate bilateral pleural effusions, with associated dependent interlobular septal thickening and heterogeneous airspace opacity, consistent with edema and/or infection. 3. Cardiomegaly, coronary artery disease, and trace pleural effusion. 4. Cholelithiasis. 5. Numerous, extremely bilateral renal cysts which completely replace the normal renal parenchyma, lesion of the superior pole of the right kidney measuring at least 23 cm. These may be symptomatic due to extreme bulk. 6. There are large staghorn calculi of the left kidney, measuring at least 4.6 and 3.8 cm. No hydronephrosis. 7. Status post prostatectomy. Aortic Atherosclerosis (ICD10-I70.0). Electronically Signed   By: Eddie Candle M.D.   On: 11/30/2020 13:58   DG Chest Portable 1 View  Result Date: 11/30/2020 CLINICAL DATA:  87 y.o male c/o feeling off balanced x 1 month. Patient also c/o feeling tired all the time. Patient also c/o abdominal pain x 2 days ago. Patient is a poor historian. Pt. States he  has had a cough for 2 days EXAM: PORTABLE CHEST 1 VIEW COMPARISON:  01/11/2015 FINDINGS: Hazy bilateral lung base opacities, right greater than left, with hemidiaphragms partly obscured, more evident on the left. Mid to upper lungs are clear. Cardiac silhouette is normal in size. No mediastinal or hilar masses. Possible small effusions.  No pneumothorax. Skeletal structures are grossly intact. IMPRESSION: 1. Mild lateral lung base airspace opacities consistent with multifocal pneumonia. No convincing pulmonary edema. Electronically Signed   By: Lajean Manes M.D.   On: 11/30/2020 11:54     Assessment and Plan:    1.  CHF   Acute diastolic  Pt presented yesterday for eval of abdominal pain some SOB and orthopnea  Found to have LE edema, bilateral pleural effusion (on noncontrastCT)    He has diuresed some with IV lasix with 1000 cc net out     Breathing is OK at rest    Sill with edeam Echo done shows LVEF is normal  There is possible severe MR (eccentric) With bump in Cr I would stop diuretics and follow      2  Mitral valve dz   Echo shows a very eccentric jet of MR    Given age, comorbidities (esp signif renal dysfunction) I would recomm maximizing medical Rx for now and follow   Need tighter control of BP   Watch I/O and renal function  3   Renal  Hx polycystic renal dz  Baselin Cr 1.9 to 2.3   Now 3.12  Hold lasix for now   Reassess in am  4  HTN   BP is severly elevated   Had been on Caduet metoprolol and losartan as outpt  Agree with stopping carden   I would add amlodipine 5 as wall as  hydralazine 10 mg tid    Follow BP  5  CAD  Noted atheroscleorsis on CT scan   No symptoms of angina    Keep on statin     ]    New York Heart Association (NYHA) Functional Class NYHA Class II        For questions or updates, please contact [facility] Please consult [email or web address] for contact info under    Signed, Dorris Carnes, MD  12/01/2020 7:56 AM

## 2020-12-02 ENCOUNTER — Encounter (HOSPITAL_COMMUNITY): Payer: Self-pay | Admitting: Internal Medicine

## 2020-12-02 DIAGNOSIS — N184 Chronic kidney disease, stage 4 (severe): Secondary | ICD-10-CM

## 2020-12-02 DIAGNOSIS — I340 Nonrheumatic mitral (valve) insufficiency: Secondary | ICD-10-CM

## 2020-12-02 DIAGNOSIS — J9601 Acute respiratory failure with hypoxia: Secondary | ICD-10-CM

## 2020-12-02 DIAGNOSIS — I248 Other forms of acute ischemic heart disease: Secondary | ICD-10-CM

## 2020-12-02 DIAGNOSIS — I5033 Acute on chronic diastolic (congestive) heart failure: Secondary | ICD-10-CM

## 2020-12-02 HISTORY — DX: Nonrheumatic mitral (valve) insufficiency: I34.0

## 2020-12-02 LAB — BASIC METABOLIC PANEL
Anion gap: 12 (ref 5–15)
BUN: 29 mg/dL — ABNORMAL HIGH (ref 8–23)
CO2: 25 mmol/L (ref 22–32)
Calcium: 7.9 mg/dL — ABNORMAL LOW (ref 8.9–10.3)
Chloride: 95 mmol/L — ABNORMAL LOW (ref 98–111)
Creatinine, Ser: 3.03 mg/dL — ABNORMAL HIGH (ref 0.61–1.24)
GFR, Estimated: 19 mL/min — ABNORMAL LOW (ref >60)
Glucose, Bld: 104 mg/dL — ABNORMAL HIGH (ref 70–99)
Potassium: 3.4 mmol/L — ABNORMAL LOW (ref 3.5–5.1)
Sodium: 132 mmol/L — ABNORMAL LOW (ref 135–145)

## 2020-12-02 MED ORDER — POTASSIUM CHLORIDE CRYS ER 20 MEQ PO TBCR
40.0000 meq | EXTENDED_RELEASE_TABLET | Freq: Once | ORAL | Status: AC
  Administered 2020-12-02: 40 meq via ORAL
  Filled 2020-12-02: qty 2

## 2020-12-02 MED ORDER — AMLODIPINE BESYLATE 5 MG PO TABS: 5.0000 mg | ORAL_TABLET | Freq: Every day | ORAL | 2 refills | Status: DC

## 2020-12-02 NOTE — TOC Transition Note (Signed)
Transition of Care [facility]) - CM/SW Discharge Note   Patient Details  Name: Rick [name] MRN: [id number] Date of Birth: 1929/04/19  [facility] CM/SW Contact:  Dessa Phi, RN Phone Number: 12/02/2020, 12:37 PM   Clinical Narrative: d/c today. No CM orders or needs per attending.             Patient Goals and CMS Choice        Discharge Placement                       Discharge Plan and Services                                     Social Determinants of Health (SDOH) Interventions     Readmission Risk Interventions No flowsheet data found.

## 2020-12-02 NOTE — Assessment & Plan Note (Signed)
--   In the setting of acute CHF, mitral regurgitation, hypertension.  No further evaluation planned.

## 2020-12-02 NOTE — Discharge Summary (Addendum)
Physician Discharge Summary  Rick [name] LHT:[id number] DOB: 02/20/1929 DOA: 11/30/2020  PCP: Tonia Ghent, MD  Admit date: 11/30/2020 Discharge date: 12/02/2020  Recommendations for Outpatient Follow-up:  Follow-up new diagnosis of diastolic CHF and mitral regurgitation, see below Follow-up CKD stage IV with possible progression. I discussed with primary nephrologist Dr. Candiss Norse, he recommended to hold ARB for now. Can restart if renal function recovers.   Follow-up Information     Tonia Ghent, MD. Schedule an appointment as soon as possible for a visit in 1 week(s).   Specialty: Family Medicine Contact information: [place] [address] [phone]         Fay Records, MD Follow up.   Specialty: Cardiology Why: The office will call to arrange a f/u appt with Dr. Harrington Challenger in the next 2-4 weeks. Contact information: [place] [address] [phone]                Discharge Diagnoses: Principal diagnosis is #1 Principal Problem:   Acute on chronic diastolic CHF (congestive heart failure) ([place]) Active Problems:   AKI (acute kidney injury) ([place]) superimposed on CKD stage IV   Polycystic kidney disease   CKD (chronic kidney disease), stage IV (HCC)   Mitral regurgitation   Glaucoma   Diarrhea   Demand ischemia ([place])   Aortic atherosclerosis ([place])   Hypertensive urgency   Acute hypoxemic respiratory failure ([place])  Discharge Condition: improved Disposition: home  Diet recommendation:  Diet Orders (From admission, onward)     Start     Ordered   12/02/20 0952  Diet Heart Room service appropriate? Yes; Fluid consistency: Thin; Fluid restriction: 1500 mL Fluid  Diet effective now       Comments: 2G NA  Question Answer Comment  Room service appropriate? Yes   Fluid consistency: Thin   Fluid restriction: 1500 mL Fluid      12/02/20 1000   12/02/20 0000  Diet - low sodium heart healthy         12/02/20 1137             Filed Weights   11/30/20 0940 12/01/20 0500 12/02/20 0500  Weight: 76.7 kg 72.5 kg 68.3 kg    HPI/Hospital Course:   87 year old man presenting with abdominal pain, diarrhea, lower extremity edema, orthopnea.  Admitted for acute CHF presumed, hypertensive urgency, AKI.  Treated with diuresis with rapid clinical improvement.  Seen by cardiology.  Hospitalization uncomplicated.  Individual issues as below.  * Acute on chronic diastolic CHF (congestive heart failure) (HCC) -- No significant peripheral edema at this time.  Asymptomatic.  Discussed with patient, wife and daughter at bedside limitation of salt in diet and weighing daily. -- Discussed with Dr. Harrington Challenger.  No diuretic at this time.  Continue amlodipine and metoprolol on discharge. -- Close outpatient follow-up with cardiology.  AKI (acute kidney injury) ([place]) superimposed on CKD stage IV -- Versus progression of chronic kidney disease.  Creatinine slightly better today.  No diuretic at this point.  Hold Cozaar for now until follow-up. -- Continue to follow with Dr. Candiss Norse as an outpatient.  Mitral regurgitation --moderate to severe.  Given advanced age and renal dysfunction cardiology plans to manage medically at this time.  Control blood pressure, limit salt.  Polycystic kidney disease --follow-up with urology and nephrology as an outpatient  Demand ischemia [facility]) -- In the setting of acute CHF, mitral regurgitation, hypertension.  No further evaluation planned.  Diarrhea --resolved, etiology and significance unclear  Glaucoma --continue gtts  Acute hypoxemic respiratory failure ([place]) --resolved, secondary to CHF  Hypertensive urgency --resolved --added amlodipine, continue metoprolol.  Aortic atherosclerosis ([place]) --continue statin    Consults:  Cardiology  Today's assessment: S: CC: f/u SOB  Feels good, breathing fine No diarrhea per RN  O: Vitals:  Vitals:   12/02/20 0101  12/02/20 0607  BP: (!) 151/83 115/70  Pulse:  87  Resp:    Temp:  97.8 F (36.6 C)  SpO2:  99%   Physical Exam Vitals and nursing note reviewed.  Constitutional:      Appearance: Normal appearance.  Cardiovascular:     Rate and Rhythm: Normal rate and regular rhythm.     Comments: Telemetry SR Pulmonary:     Effort: Pulmonary effort is normal. No respiratory distress.     Breath sounds: Normal breath sounds. No wheezing or rales.  Neurological:     Mental Status: He is alert.  Psychiatric:        Mood and Affect: Mood normal.        Behavior: Behavior normal.     Discharge Instructions  Discharge Instructions     (HEART FAILURE PATIENTS) Call MD:  Anytime you have any of the following symptoms: 1) 3 pound weight gain in 24 hours or 5 pounds in 1 week 2) shortness of breath, with or without a dry hacking cough 3) swelling in the hands, feet or stomach 4) if you have to sleep on extra pillows at night in order to breathe.   Complete by: As directed    Diet - low sodium heart healthy   Complete by: As directed    Discharge instructions   Complete by: As directed    Call your physician or seek immediate medical attention for shortness or breath, swelling, pain or worsening of condition.   Increase activity slowly   Complete by: As directed       Allergies as of 12/02/2020       Reactions   Lisinopril    cough        Medication List     STOP taking these medications    amlodipine-atorvastatin 10-10 MG tablet Commonly known as: CADUET   Iron 325 (65 Fe) MG Tabs   losartan 100 MG tablet Commonly known as: COZAAR   omeprazole 20 MG capsule Commonly known as: PRILOSEC       TAKE these medications    amLODipine 5 MG tablet Commonly known as: NORVASC Take 1 tablet (5 mg total) by mouth daily.   atorvastatin 10 MG tablet Commonly known as: LIPITOR Take 1 tablet by mouth at bedtime.   latanoprost 0.005 % ophthalmic solution Commonly known as:  XALATAN Place 1 drop into both eyes at bedtime.   metoprolol succinate 50 MG 24 hr tablet Commonly known as: TOPROL-XL Take with or immediately following a meal. What changed:  how much to take how to take this when to take this   omeprazole 20 MG tablet Commonly known as: PRILOSEC OTC Take 20 mg by mouth daily.   psyllium 0.52 g capsule Commonly known as: REGULOID Take 0.52 g by mouth daily.       Allergies  Allergen Reactions   Lisinopril     cough    The results of significant diagnostics from this hospitalization (including imaging, microbiology, ancillary and laboratory) are listed below for reference.    Significant Diagnostic Studies: CT ABDOMEN PELVIS WO CONTRAST  Result Date:  11/30/2020 CLINICAL DATA:  Abdominal pain EXAM: CT ABDOMEN AND PELVIS WITHOUT CONTRAST TECHNIQUE: Multidetector CT imaging of the abdomen and pelvis was performed following the standard protocol without IV contrast. COMPARISON:  CT abdomen pelvis, 08/18/2014, MR abdomen, 09/01/2020 FINDINGS: Lower chest: Moderate bilateral pleural effusions, with associated dependent interlobular septal thickening and heterogeneous airspace opacity. Underlying emphysema. Cardiomegaly, coronary artery calcifications, and trace pleural effusion. Hepatobiliary: No solid liver abnormality is seen. Gallstones and sludge in the gallbladder. No gallbladder wall thickening, or biliary dilatation. Pancreas: Unremarkable. No pancreatic ductal dilatation or surrounding inflammatory changes. Spleen: Normal in size without significant abnormality. Adrenals/Urinary Tract: Adrenal glands are unremarkable. Numerous, extremely bilateral renal cysts which completely replace the normal renal parenchyma, lesion of the superior pole of the right kidney measuring at least 23 cm. There are large staghorn calculi of the left kidney, measuring at least 4.6 and 3.8 cm (series 5, image 85). No hydronephrosis. Bladder is unremarkable.  Stomach/Bowel: Stomach is within normal limits. Appendix appears normal. No evidence of bowel wall thickening, distention, or inflammatory changes. Sigmoid diverticulosis. Vascular/Lymphatic: Aortic atherosclerosis. No enlarged abdominal or pelvic lymph nodes. Reproductive: Status post prostatectomy. Other: Small, fat containing right inguinal hernia. Anasarca. No abdominopelvic ascites. Musculoskeletal: No acute or significant osseous findings. IMPRESSION: 1. No acute non-contrast CT findings of the abdomen or pelvis to explain abdominal pain. 2. Moderate bilateral pleural effusions, with associated dependent interlobular septal thickening and heterogeneous airspace opacity, consistent with edema and/or infection. 3. Cardiomegaly, coronary artery disease, and trace pleural effusion. 4. Cholelithiasis. 5. Numerous, extremely bilateral renal cysts which completely replace the normal renal parenchyma, lesion of the superior pole of the right kidney measuring at least 23 cm. These may be symptomatic due to extreme bulk. 6. There are large staghorn calculi of the left kidney, measuring at least 4.6 and 3.8 cm. No hydronephrosis. 7. Status post prostatectomy. Aortic Atherosclerosis (ICD10-I70.0). Electronically Signed   By: Eddie Candle M.D.   On: 11/30/2020 13:58   DG Chest Portable 1 View  Result Date: 11/30/2020 CLINICAL DATA:  87 y.o male c/o feeling off balanced x 1 month. Patient also c/o feeling tired all the time. Patient also c/o abdominal pain x 2 days ago. Patient is a poor historian. Pt. States he has had a cough for 2 days EXAM: PORTABLE CHEST 1 VIEW COMPARISON:  01/11/2015 FINDINGS: Hazy bilateral lung base opacities, right greater than left, with hemidiaphragms partly obscured, more evident on the left. Mid to upper lungs are clear. Cardiac silhouette is normal in size. No mediastinal or hilar masses. Possible small effusions.  No pneumothorax. Skeletal structures are grossly intact. IMPRESSION: 1.  Mild lateral lung base airspace opacities consistent with multifocal pneumonia. No convincing pulmonary edema. Electronically Signed   By: Lajean Manes M.D.   On: 11/30/2020 11:54   ECHOCARDIOGRAM COMPLETE  Result Date: 12/01/2020    ECHOCARDIOGRAM REPORT   Patient Name:   GAIL CREEKMORE Date of Exam: 12/01/2020 Medical Rec #:  [id number]       Height:       68.0 in Accession #:    [id number]      Weight:       159.8 lb Date of Birth:  1928/08/27       BSA:          1.858 m Patient Age:    70 years        BP:           163/84 mmHg Patient Gender: M  HR:           81 bpm. Exam Location:  Inpatient Procedure: 2D Echo, Cardiac Doppler and Color Doppler Indications:    I50.23 Acute on chronic systolic (congestive) heart failure  History:        Patient has no prior history of Echocardiogram examinations.                 Risk Factors:Hypertension and Dyslipidemia. GERD. Cancer.  Sonographer:    Tiffany Dance Referring Phys: [id number] [place]  1. Moderate to severe mitral regurgitation is present, likely severe. The jet is eccentric and anteriorly directed. There is likely pulmonary vein flow reversal in systole in the right inferior pulmonary vein. There is splay artifact present, indicating  at least moderate severity. The jet appears to wrap around the LA, indicative of severe MR. RVSP elevated. There is no obvious flail segment, but suspect this is not well seen. Would recommend a TEE for characterization of the MR. The mitral valve is grossly normal. Moderate to severe mitral valve regurgitation. No evidence of mitral stenosis.  2. Left ventricular ejection fraction, by estimation, is 60 to 65%. The left ventricle has normal function. The left ventricle has no regional wall motion abnormalities. There is severe asymmetric left ventricular hypertrophy of the basal-septal segment. Indeterminate diastolic filling due to E-A fusion.  3. Right ventricular systolic function is normal. The  right ventricular size is normal. There is mildly elevated pulmonary artery systolic pressure. The estimated right ventricular systolic pressure is 09.6 mmHg.  4. The aortic valve is tricuspid. Aortic valve regurgitation is mild to moderate. Mild aortic valve sclerosis is present, with no evidence of aortic valve stenosis.  5. The inferior vena cava is normal in size with greater than 50% respiratory variability, suggesting right atrial pressure of 3 mmHg.  6. There is a large cystic structure located inferior to the liver, which could represent a cyst. Would recommend dedicated liver imaging. FINDINGS  Left Ventricle: Left ventricular ejection fraction, by estimation, is 60 to 65%. The left ventricle has normal function. The left ventricle has no regional wall motion abnormalities. The left ventricular internal cavity size was normal in size. There is  severe asymmetric left ventricular hypertrophy of the basal-septal segment. Indeterminate diastolic filling due to E-A fusion. Right Ventricle: The right ventricular size is normal. No increase in right ventricular wall thickness. Right ventricular systolic function is normal. There is mildly elevated pulmonary artery systolic pressure. The tricuspid regurgitant velocity is 3.20  m/s, and with an assumed right atrial pressure of 3 mmHg, the estimated right ventricular systolic pressure is 28.3 mmHg. Left Atrium: Left atrial size was normal in size. Right Atrium: Right atrial size was normal in size. Pericardium: Trivial pericardial effusion is present. Mitral Valve: Moderate to severe mitral regurgitation is present, likely severe. The jet is eccentric and anteriorly directed. There is likely pulmonary vein flow reversal in systole in the right inferior pulmonary vein. There is splay artifact present, indicating at least moderate severity. The jet appears to wrap around the LA, indicative of severe MR. RVSP elevated. There is no obvious flail segment, but suspect  this is not well seen. Would recommend a TEE for characterization of the MR. The mitral valve is grossly normal. Moderate to severe mitral valve regurgitation, with anteriorly-directed jet. No evidence of mitral valve stenosis. Tricuspid Valve: The tricuspid valve is grossly normal. Tricuspid valve regurgitation is mild . No evidence of tricuspid stenosis. Aortic Valve: The aortic valve  is tricuspid. Aortic valve regurgitation is mild to moderate. Aortic regurgitation PHT measures 280 msec. Mild aortic valve sclerosis is present, with no evidence of aortic valve stenosis. Aortic valve mean gradient measures 8.0 mmHg. Aortic valve peak gradient measures 14.0 mmHg. Aortic valve area, by VTI measures 2.31 cm. Pulmonic Valve: The pulmonic valve was grossly normal. Pulmonic valve regurgitation is mild. No evidence of pulmonic stenosis. Aorta: The aortic root and ascending aorta are structurally normal, with no evidence of dilitation. Venous: The inferior vena cava is normal in size with greater than 50% respiratory variability, suggesting right atrial pressure of 3 mmHg. IAS/Shunts: The atrial septum is grossly normal. Additional Comments: There is a small pleural effusion in the left lateral region.  LEFT VENTRICLE PLAX 2D LVIDd:         4.80 cm LVIDs:         2.80 cm LV PW:         1.20 cm LV IVS:        1.60 cm LVOT diam:     2.10 cm LV SV:         81 LV SV Index:   44 LVOT Area:     3.46 cm  RIGHT VENTRICLE          IVC RV Basal diam:  2.60 cm  IVC diam: 1.50 cm TAPSE (M-mode): 1.8 cm LEFT ATRIUM             Index       RIGHT ATRIUM           Index LA diam:        3.80 cm 2.05 cm/m  RA Area:     13.30 cm LA Vol (A2C):   95.9 ml 51.61 ml/m RA Volume:   28.90 ml  15.55 ml/m LA Vol (A4C):   40.7 ml 21.91 ml/m LA Biplane Vol: 66.6 ml 35.84 ml/m  AORTIC VALVE AV Area (Vmax):    2.31 cm AV Area (Vmean):   2.22 cm AV Area (VTI):     2.31 cm AV Vmax:           187.33 cm/s AV Vmean:          129.333 cm/s AV VTI:             0.352 m AV Peak Grad:      14.0 mmHg AV Mean Grad:      8.0 mmHg LVOT Vmax:         125.00 cm/s LVOT Vmean:        82.850 cm/s LVOT VTI:          0.234 m LVOT/AV VTI ratio: 0.67 AI PHT:            280 msec  AORTA Ao Root diam: 3.80 cm Ao Asc diam:  3.90 cm MV A velocity: 135.50 cm/s  TRICUSPID VALVE                             TR Peak grad:   41.0 mmHg                             TR Vmax:        320.00 cm/s                              SHUNTS  Systemic VTI:  0.23 m                             Systemic Diam: 2.10 cm Eleonore Chiquito MD Electronically signed by Eleonore Chiquito MD Signature Date/Time: 12/01/2020/12:22:23 PM    Final     Microbiology: Recent Results (from the past 240 hour(s))  Resp Panel by RT-PCR (Flu A&B, Covid) Nasopharyngeal Swab     Status: None   Collection Time: 11/30/20 11:29 AM   Specimen: Nasopharyngeal Swab; Nasopharyngeal(NP) swabs in vial transport medium  Result Value Ref Range Status   SARS Coronavirus 2 by RT PCR NEGATIVE NEGATIVE Final    Comment: (NOTE) SARS-CoV-2 target nucleic acids are NOT DETECTED.  The SARS-CoV-2 RNA is generally detectable in upper respiratory specimens during the acute phase of infection. The lowest concentration of SARS-CoV-2 viral copies this assay can detect is 138 copies/mL. A negative result does not preclude SARS-Cov-2 infection and should not be used as the sole basis for treatment or other patient management decisions. A negative result may occur with  improper specimen collection/handling, submission of specimen other than nasopharyngeal swab, presence of viral mutation(s) within the areas targeted by this assay, and inadequate number of viral copies(<138 copies/mL). A negative result must be combined with clinical observations, patient history, and epidemiological information. The expected result is Negative.  Fact Sheet for Patients:  EntrepreneurPulse.com.au  Fact Sheet for  Healthcare Providers:  IncredibleEmployment.be  This test is no t yet approved or cleared by the [place] FDA and  has been authorized for detection and/or diagnosis of SARS-CoV-2 by FDA under an Emergency Use Authorization (EUA). This EUA will remain  in effect (meaning this test can be used) for the duration of the COVID-19 declaration under Section 564(b)(1) of the Act, 21 U.S.C.section 360bbb-3(b)(1), unless the authorization is terminated  or revoked sooner.       Influenza A by PCR NEGATIVE NEGATIVE Final   Influenza B by PCR NEGATIVE NEGATIVE Final    Comment: (NOTE) The Xpert Xpress SARS-CoV-2/FLU/RSV plus assay is intended as an aid in the diagnosis of influenza from Nasopharyngeal swab specimens and should not be used as a sole basis for treatment. Nasal washings and aspirates are unacceptable for Xpert Xpress SARS-CoV-2/FLU/RSV testing.  Fact Sheet for Patients: EntrepreneurPulse.com.au  Fact Sheet for Healthcare Providers: IncredibleEmployment.be  This test is not yet approved or cleared by the [place] FDA and has been authorized for detection and/or diagnosis of SARS-CoV-2 by FDA under an Emergency Use Authorization (EUA). This EUA will remain in effect (meaning this test can be used) for the duration of the COVID-19 declaration under Section 564(b)(1) of the Act, 21 U.S.C. section 360bbb-3(b)(1), unless the authorization is terminated or revoked.  Performed at [facility], [place] [address], [place], [place] [address]      Labs: Basic Metabolic Panel: Recent Labs  Lab 11/30/20 1107 12/01/20 0657 12/02/20 0450  NA 131* 132* 132*  K 3.6 3.4* 3.4*  CL 95* 94* 95*  CO2 25 25 25   GLUCOSE 100* 70 104*  BUN 20 26* 29*  CREATININE 2.87* 3.12* 3.03*  CALCIUM 8.5* 8.2* 7.9*   Liver Function Tests: Recent Labs  Lab 11/30/20 1107 12/01/20 0657  AST 22 48*  ALT 14 24   ALKPHOS 61 103  BILITOT 1.3* 1.9*  PROT 6.9 6.7  ALBUMIN 2.7* 2.6*   Recent Labs  Lab 11/30/20 1107  LIPASE 37  CBC: Recent Labs  Lab 11/30/20 1019 12/01/20 0657  WBC 8.1 11.8*  HGB 13.4 12.2*  HCT 40.1 37.1*  MCV 92.4 92.1  PLT 189 169   Recent Labs    11/30/20 1107  BNP 2,320.4*   Principal Problem:   Acute on chronic diastolic CHF (congestive heart failure) (HCC) Active Problems:   AKI (acute kidney injury) ([place]) superimposed on CKD stage IV   Polycystic kidney disease   CKD (chronic kidney disease), stage IV (HCC)   Mitral regurgitation   Glaucoma   Diarrhea   Demand ischemia ([place])   Aortic atherosclerosis ([place])   Hypertensive urgency   Acute hypoxemic respiratory failure ([place])   Time coordinating discharge: 25 minutes  Signed:  Murray Hodgkins, MD  [facility]  12/02/2020, 3:57 PM

## 2020-12-02 NOTE — Assessment & Plan Note (Signed)
--  moderate to severe.  Given advanced age and renal dysfunction cardiology plans to manage medically at this time.  Control blood pressure, limit salt.

## 2020-12-02 NOTE — Progress Notes (Signed)
Progress Note  Patient Name: Rick [name] Date of Encounter: 12/02/2020  [facility] Cardiologist: New  Subjective   Breathing is OK  No CP   Inpatient Medications    Scheduled Meds:  amLODipine  5 mg Oral Daily   atorvastatin  10 mg Oral QHS   heparin  5,000 Units Subcutaneous Q8H   hydrALAZINE  10 mg Oral Q8H   labetalol  10 mg Intravenous Once   latanoprost  1 drop Both Eyes QHS   metoprolol succinate  50 mg Oral Daily   Continuous Infusions:    Vital Signs    Vitals:   12/01/20 2250 12/02/20 0101 12/02/20 0500 12/02/20 0607  BP: (!) 180/86 (!) 151/83  115/70  Pulse:    87  Resp:      Temp:    97.8 F (36.6 C)  TempSrc:    Oral  SpO2:    99%  Weight:   68.3 kg   Height:        Intake/Output Summary (Last 24 hours) at 12/02/2020 0748 Last data filed at 12/02/2020 3810 Gross per 24 hour  Intake 360 ml  Output 2025 ml  Net -1665 ml   Last 3 Weights 12/02/2020 12/01/2020 11/30/2020  Weight (lbs) 150 lb 9.2 oz 159 lb 13.3 oz 169 lb  Weight (kg) 68.3 kg 72.5 kg 76.658 kg      Telemetry    SR  - Personally Reviewed  ECG    No new  - Personally Reviewed  Physical Exam   GEN: No acute distress.   Neck: No JVD Cardiac: RRR, Gr III/VI systolic murmur apex   Respiratory: Clear to auscultation bilaterally. GI: Soft, nontender, non-distended  Mobile mass (? Lipoma) mid abdomen) MS: No edema; No deformity. Neuro:  Nonfocal  Psych: Normal affect   Labs    High Sensitivity Troponin:   Recent Labs  Lab 11/30/20 1107 11/30/20 1319  TROPONINIHS 47* 45*      Chemistry Recent Labs  Lab 11/30/20 1107 12/01/20 0657 12/02/20 0450  NA 131* 132* 132*  K 3.6 3.4* 3.4*  CL 95* 94* 95*  CO2 25 25 25   GLUCOSE 100* 70 104*  BUN 20 26* 29*  CREATININE 2.87* 3.12* 3.03*  CALCIUM 8.5* 8.2* 7.9*  PROT 6.9 6.7  --   ALBUMIN 2.7* 2.6*  --   AST 22 48*  --   ALT 14 24  --   ALKPHOS 61 103  --   BILITOT 1.3* 1.9*  --   GFRNONAA 20* 18* 19*   ANIONGAP 11 13 12      Hematology Recent Labs  Lab 11/30/20 1019 12/01/20 0657  WBC 8.1 11.8*  RBC 4.34 4.03*  HGB 13.4 12.2*  HCT 40.1 37.1*  MCV 92.4 92.1  MCH 30.9 30.3  MCHC 33.4 32.9  RDW 15.6* 15.9*  PLT 189 169    BNP Recent Labs  Lab 11/30/20 1107  BNP 2,320.4*     DDimer No results for input(s): DDIMER in the last 168 hours.   Radiology    CT ABDOMEN PELVIS WO CONTRAST  Result Date: 11/30/2020 CLINICAL DATA:  Abdominal pain EXAM: CT ABDOMEN AND PELVIS WITHOUT CONTRAST TECHNIQUE: Multidetector CT imaging of the abdomen and pelvis was performed following the standard protocol without IV contrast. COMPARISON:  CT abdomen pelvis, 08/18/2014, MR abdomen, 09/01/2020 FINDINGS: Lower chest: Moderate bilateral pleural effusions, with associated dependent interlobular septal thickening and heterogeneous airspace opacity. Underlying emphysema. Cardiomegaly, coronary artery calcifications, and trace pleural effusion.  Hepatobiliary: No solid liver abnormality is seen. Gallstones and sludge in the gallbladder. No gallbladder wall thickening, or biliary dilatation. Pancreas: Unremarkable. No pancreatic ductal dilatation or surrounding inflammatory changes. Spleen: Normal in size without significant abnormality. Adrenals/Urinary Tract: Adrenal glands are unremarkable. Numerous, extremely bilateral renal cysts which completely replace the normal renal parenchyma, lesion of the superior pole of the right kidney measuring at least 23 cm. There are large staghorn calculi of the left kidney, measuring at least 4.6 and 3.8 cm (series 5, image 85). No hydronephrosis. Bladder is unremarkable. Stomach/Bowel: Stomach is within normal limits. Appendix appears normal. No evidence of bowel wall thickening, distention, or inflammatory changes. Sigmoid diverticulosis. Vascular/Lymphatic: Aortic atherosclerosis. No enlarged abdominal or pelvic lymph nodes. Reproductive: Status post prostatectomy. Other:  Small, fat containing right inguinal hernia. Anasarca. No abdominopelvic ascites. Musculoskeletal: No acute or significant osseous findings. IMPRESSION: 1. No acute non-contrast CT findings of the abdomen or pelvis to explain abdominal pain. 2. Moderate bilateral pleural effusions, with associated dependent interlobular septal thickening and heterogeneous airspace opacity, consistent with edema and/or infection. 3. Cardiomegaly, coronary artery disease, and trace pleural effusion. 4. Cholelithiasis. 5. Numerous, extremely bilateral renal cysts which completely replace the normal renal parenchyma, lesion of the superior pole of the right kidney measuring at least 23 cm. These may be symptomatic due to extreme bulk. 6. There are large staghorn calculi of the left kidney, measuring at least 4.6 and 3.8 cm. No hydronephrosis. 7. Status post prostatectomy. Aortic Atherosclerosis (ICD10-I70.0). Electronically Signed   By: Eddie Candle M.D.   On: 11/30/2020 13:58   DG Chest Portable 1 View  Result Date: 11/30/2020 CLINICAL DATA:  87 y.o male c/o feeling off balanced x 1 month. Patient also c/o feeling tired all the time. Patient also c/o abdominal pain x 2 days ago. Patient is a poor historian. Pt. States he has had a cough for 2 days EXAM: PORTABLE CHEST 1 VIEW COMPARISON:  01/11/2015 FINDINGS: Hazy bilateral lung base opacities, right greater than left, with hemidiaphragms partly obscured, more evident on the left. Mid to upper lungs are clear. Cardiac silhouette is normal in size. No mediastinal or hilar masses. Possible small effusions.  No pneumothorax. Skeletal structures are grossly intact. IMPRESSION: 1. Mild lateral lung base airspace opacities consistent with multifocal pneumonia. No convincing pulmonary edema. Electronically Signed   By: Lajean Manes M.D.   On: 11/30/2020 11:54   ECHOCARDIOGRAM COMPLETE  Result Date: 12/01/2020    ECHOCARDIOGRAM REPORT   Patient Name:   Rick [name] Date of Exam:  12/01/2020 Medical Rec #:  [id number]       Height:       68.0 in Accession #:    [id number]      Weight:       159.8 lb Date of Birth:  Apr 12, 1929       BSA:          1.858 m Patient Age:    87 years        BP:           163/84 mmHg Patient Gender: M               HR:           81 bpm. Exam Location:  Inpatient Procedure: 2D Echo, Cardiac Doppler and Color Doppler Indications:    I50.23 Acute on chronic systolic (congestive) heart failure  History:        Patient has no prior history of Echocardiogram examinations.  Risk Factors:Hypertension and Dyslipidemia. GERD. Cancer.  Sonographer:    Tiffany Dance Referring Phys: [id number] [place]  1. Moderate to severe mitral regurgitation is present, likely severe. The jet is eccentric and anteriorly directed. There is likely pulmonary vein flow reversal in systole in the right inferior pulmonary vein. There is splay artifact present, indicating  at least moderate severity. The jet appears to wrap around the LA, indicative of severe MR. RVSP elevated. There is no obvious flail segment, but suspect this is not well seen. Would recommend a TEE for characterization of the MR. The mitral valve is grossly normal. Moderate to severe mitral valve regurgitation. No evidence of mitral stenosis.  2. Left ventricular ejection fraction, by estimation, is 60 to 65%. The left ventricle has normal function. The left ventricle has no regional wall motion abnormalities. There is severe asymmetric left ventricular hypertrophy of the basal-septal segment. Indeterminate diastolic filling due to E-A fusion.  3. Right ventricular systolic function is normal. The right ventricular size is normal. There is mildly elevated pulmonary artery systolic pressure. The estimated right ventricular systolic pressure is 16.9 mmHg.  4. The aortic valve is tricuspid. Aortic valve regurgitation is mild to moderate. Mild aortic valve sclerosis is present, with no evidence of aortic  valve stenosis.  5. The inferior vena cava is normal in size with greater than 50% respiratory variability, suggesting right atrial pressure of 3 mmHg.  6. There is a large cystic structure located inferior to the liver, which could represent a cyst. Would recommend dedicated liver imaging. FINDINGS  Left Ventricle: Left ventricular ejection fraction, by estimation, is 60 to 65%. The left ventricle has normal function. The left ventricle has no regional wall motion abnormalities. The left ventricular internal cavity size was normal in size. There is  severe asymmetric left ventricular hypertrophy of the basal-septal segment. Indeterminate diastolic filling due to E-A fusion. Right Ventricle: The right ventricular size is normal. No increase in right ventricular wall thickness. Right ventricular systolic function is normal. There is mildly elevated pulmonary artery systolic pressure. The tricuspid regurgitant velocity is 3.20  m/s, and with an assumed right atrial pressure of 3 mmHg, the estimated right ventricular systolic pressure is 67.8 mmHg. Left Atrium: Left atrial size was normal in size. Right Atrium: Right atrial size was normal in size. Pericardium: Trivial pericardial effusion is present. Mitral Valve: Moderate to severe mitral regurgitation is present, likely severe. The jet is eccentric and anteriorly directed. There is likely pulmonary vein flow reversal in systole in the right inferior pulmonary vein. There is splay artifact present, indicating at least moderate severity. The jet appears to wrap around the LA, indicative of severe MR. RVSP elevated. There is no obvious flail segment, but suspect this is not well seen. Would recommend a TEE for characterization of the MR. The mitral valve is grossly normal. Moderate to severe mitral valve regurgitation, with anteriorly-directed jet. No evidence of mitral valve stenosis. Tricuspid Valve: The tricuspid valve is grossly normal. Tricuspid valve  regurgitation is mild . No evidence of tricuspid stenosis. Aortic Valve: The aortic valve is tricuspid. Aortic valve regurgitation is mild to moderate. Aortic regurgitation PHT measures 280 msec. Mild aortic valve sclerosis is present, with no evidence of aortic valve stenosis. Aortic valve mean gradient measures 8.0 mmHg. Aortic valve peak gradient measures 14.0 mmHg. Aortic valve area, by VTI measures 2.31 cm. Pulmonic Valve: The pulmonic valve was grossly normal. Pulmonic valve regurgitation is mild. No evidence of pulmonic stenosis. Aorta: The  aortic root and ascending aorta are structurally normal, with no evidence of dilitation. Venous: The inferior vena cava is normal in size with greater than 50% respiratory variability, suggesting right atrial pressure of 3 mmHg. IAS/Shunts: The atrial septum is grossly normal. Additional Comments: There is a small pleural effusion in the left lateral region.  LEFT VENTRICLE PLAX 2D LVIDd:         4.80 cm LVIDs:         2.80 cm LV PW:         1.20 cm LV IVS:        1.60 cm LVOT diam:     2.10 cm LV SV:         81 LV SV Index:   44 LVOT Area:     3.46 cm  RIGHT VENTRICLE          IVC RV Basal diam:  2.60 cm  IVC diam: 1.50 cm TAPSE (M-mode): 1.8 cm LEFT ATRIUM             Index       RIGHT ATRIUM           Index LA diam:        3.80 cm 2.05 cm/m  RA Area:     13.30 cm LA Vol (A2C):   95.9 ml 51.61 ml/m RA Volume:   28.90 ml  15.55 ml/m LA Vol (A4C):   40.7 ml 21.91 ml/m LA Biplane Vol: 66.6 ml 35.84 ml/m  AORTIC VALVE AV Area (Vmax):    2.31 cm AV Area (Vmean):   2.22 cm AV Area (VTI):     2.31 cm AV Vmax:           187.33 cm/s AV Vmean:          129.333 cm/s AV VTI:            0.352 m AV Peak Grad:      14.0 mmHg AV Mean Grad:      8.0 mmHg LVOT Vmax:         125.00 cm/s LVOT Vmean:        82.850 cm/s LVOT VTI:          0.234 m LVOT/AV VTI ratio: 0.67 AI PHT:            280 msec  AORTA Ao Root diam: 3.80 cm Ao Asc diam:  3.90 cm MV A velocity: 135.50 cm/s   TRICUSPID VALVE                             TR Peak grad:   41.0 mmHg                             TR Vmax:        320.00 cm/s                              SHUNTS                             Systemic VTI:  0.23 m                             Systemic Diam: 2.10 cm Eleonore Chiquito MD Electronically signed by Eleonore Chiquito MD  Signature Date/Time: 12/01/2020/12:22:23 PM    Final     Cardiac Studies    Moderate to severe mitral regurgitation is present, likely severe. The jet is eccentric and anteriorly directed. There is likely pulmonary vein flow reversal in systole in the right inferior pulmonary vein. There is splay artifact present, indicating at least moderate severity. The jet appears to wrap around the LA, indicative of severe MR. RVSP elevated. There is no obvious flail segment, but suspect this is not well seen. Would recommend a TEE for characterization of the MR. The mitral valve is grossly normal. Moderate to severe mitral valve regurgitation. No evidence of mitral stenosis. 2. Left ventricular ejection fraction, by estimation, is 60 to 65%. The left ventricle has normal function. The left ventricle has no regional wall motion abnormalities. There is severe asymmetric left ventricular hypertrophy of the basal-septal segment. Indeterminate diastolic filling due to E-A fusion. 3. Right ventricular systolic function is normal. The right ventricular size is normal. There is mildly elevated pulmonary artery systolic pressure. The estimated right ventricular systolic pressure is 76.8 mmHg. 4. The aortic valve is tricuspid. Aortic valve regurgitation is mild to moderate. Mild aortic valve sclerosis is present, with no evidence of aortic valve stenosis. 5. The inferior vena cava is normal in size with greater than 50% respiratory variability, suggesting right atrial pressure of 3 mmHg. 6. There is a large cystic structure located inferior to the liver, which could represent a cyst. Would  recommend dedicated liver imaging.    Patient Profile     Pt is an 87 yo with hx of HTN who we are asked to see for evaluation of edema, CHF at the request of Dr Sarajane Jews   Assessment & Plan    1  Acute on chronic diastolic CHF  Pt presented with orthopnea and SOB   REceived 1 dose lasix   I/O   ? Accurate VOlume has improved since admit  (no LE edema, laying flat) He is at risk for reaccumulation given HTN, CKD and mitral regurg Spoke with patient and daughter   Limit salt in diet Weight daily   Follow as outpt  2  Mitral regurg.  PT with significant mitral regurgitation  At least moderate, may be severe.   Given age, degree of renal dysfunction would try to manage medically    Control BP Limit salt   Follow symptoms    Weigh daily    3  Elevated trop   may represent demand in setting of HTN and mitral regurg  No CP   4  HTN  BP is better   Would contine amlodipine and metorpllol   WIll need to be followed as outpt        Pt OK to d/c if he gets out pf bed    Will make sure he has f/u   For questions or updates, please contact [facility] Please consult [email or web address] for contact info under        Signed, Dorris Carnes, MD  12/02/2020, 7:48 AM

## 2020-12-03 ENCOUNTER — Telehealth: Payer: Self-pay

## 2020-12-03 NOTE — Telephone Encounter (Signed)
Transition Care Management Unsuccessful Follow-up Telephone Call  Date of discharge and from where:  12/02/2020, [name]   Attempts:  3rd Attempt  Reason for unsuccessful TCM follow-up call:  Left voice message

## 2020-12-03 NOTE — Telephone Encounter (Signed)
Transition Care Management Unsuccessful Follow-up Telephone Call  Date of discharge and from where:  12/02/2020, [facility]  Attempts:  2nd Attempt  Reason for unsuccessful TCM follow-up call:  No answer/busy

## 2020-12-03 NOTE — Telephone Encounter (Signed)
Transition Care Management Unsuccessful Follow-up Telephone Call  Date of discharge and from where:  12/02/2020, [name]  Attempts:  1st Attempt  Reason for unsuccessful TCM follow-up call:  Left voice message

## 2020-12-04 NOTE — Telephone Encounter (Signed)
Please try to get update on patient.  Thanks.

## 2020-12-05 NOTE — Telephone Encounter (Signed)
LMTCB

## 2020-12-07 ENCOUNTER — Other Ambulatory Visit: Payer: Self-pay

## 2020-12-07 ENCOUNTER — Encounter: Payer: Self-pay | Admitting: Family Medicine

## 2020-12-07 ENCOUNTER — Ambulatory Visit: Payer: Medicare PPO | Admitting: Family Medicine

## 2020-12-07 VITALS — BP 136/78 | HR 56 | Temp 97.5°F | Ht 68.0 in | Wt 154.0 lb

## 2020-12-07 DIAGNOSIS — N179 Acute kidney failure, unspecified: Secondary | ICD-10-CM | POA: Diagnosis not present

## 2020-12-07 DIAGNOSIS — R7989 Other specified abnormal findings of blood chemistry: Secondary | ICD-10-CM | POA: Diagnosis not present

## 2020-12-07 DIAGNOSIS — I340 Nonrheumatic mitral (valve) insufficiency: Secondary | ICD-10-CM

## 2020-12-07 DIAGNOSIS — K668 Other specified disorders of peritoneum: Secondary | ICD-10-CM | POA: Diagnosis not present

## 2020-12-07 LAB — CBC WITH DIFFERENTIAL/PLATELET
Basophils Absolute: 0.1 10*3/uL (ref 0.0–0.1)
Basophils Relative: 1 % (ref 0.0–3.0)
Eosinophils Absolute: 0.1 10*3/uL (ref 0.0–0.7)
Eosinophils Relative: 1.7 % (ref 0.0–5.0)
HCT: 36.8 % — ABNORMAL LOW (ref 39.0–52.0)
Hemoglobin: 12.6 g/dL — ABNORMAL LOW (ref 13.0–17.0)
Lymphocytes Relative: 12.8 % (ref 12.0–46.0)
Lymphs Abs: 0.8 10*3/uL (ref 0.7–4.0)
MCHC: 34.4 g/dL (ref 30.0–36.0)
MCV: 89.2 fl (ref 78.0–100.0)
Monocytes Absolute: 0.6 10*3/uL (ref 0.1–1.0)
Monocytes Relative: 10.5 % (ref 3.0–12.0)
Neutro Abs: 4.6 10*3/uL (ref 1.4–7.7)
Neutrophils Relative %: 74 % (ref 43.0–77.0)
Platelets: 186 10*3/uL (ref 150.0–400.0)
RBC: 4.12 Mil/uL — ABNORMAL LOW (ref 4.22–5.81)
RDW: 16 % — ABNORMAL HIGH (ref 11.5–15.5)
WBC: 6.2 10*3/uL (ref 4.0–10.5)

## 2020-12-07 LAB — BASIC METABOLIC PANEL
BUN: 20 mg/dL (ref 6–23)
CO2: 29 mEq/L (ref 19–32)
Calcium: 8.8 mg/dL (ref 8.4–10.5)
Chloride: 93 mEq/L — ABNORMAL LOW (ref 96–112)
Creatinine, Ser: 2.35 mg/dL — ABNORMAL HIGH (ref 0.40–1.50)
GFR: 23.9 mL/min — ABNORMAL LOW (ref >60.00)
Glucose, Bld: 125 mg/dL — ABNORMAL HIGH (ref 70–99)
Potassium: 4.6 mEq/L (ref 3.5–5.1)
Sodium: 127 mEq/L — ABNORMAL LOW (ref 135–145)

## 2020-12-07 MED ORDER — ATORVASTATIN CALCIUM 10 MG PO TABS: 10.0000 mg | ORAL_TABLET | Freq: Every day | ORAL | 3 refills | Status: DC

## 2020-12-07 MED ORDER — OMEPRAZOLE MAGNESIUM 20 MG PO TBEC: 20.0000 mg | DELAYED_RELEASE_TABLET | Freq: Every day | ORAL | Status: DC

## 2020-12-07 NOTE — Patient Instructions (Signed)
I'll check on the kidney and heart appointments.  I'll check on the follow up imaging.   Go to the lab on the way out.   If you have mychart we'll likely use that to update you.    Take care.  Glad to see you. Don't change your meds for now.  Limit salt- don't add extra salt.

## 2020-12-07 NOTE — Progress Notes (Signed)
This visit occurred during the SARS-CoV-2 public health emergency.  Safety protocols were in place, including screening questions prior to the visit, additional usage of staff PPE, and extensive cleaning of exam room while observing appropriate contact time as indicated for disinfecting solutions.  Inpatient f/u for CHF/inc Cr/mitral valve changes.       1. Moderate to severe mitral regurgitation is present, likely severe. The jet is eccentric and anteriorly directed. There is likely pulmonary vein flow reversal in systole in the right inferior pulmonary vein. There is splay artifact present, indicating  at least moderate severity. The jet appears to wrap around the LA, indicative of severe MR. RVSP elevated. There is no obvious flail segment, but suspect this is not well seen. Would recommend a TEE for characterization of the MR. The mitral valve is  grossly normal. Moderate to severe mitral valve regurgitation. No evidence  of mitral stenosis.    2. Left ventricular ejection fraction, by estimation, is 60 to 65%. The left ventricle has normal function. The left ventricle has no regional wall motion abnormalities. There is severe asymmetric left ventricular hypertrophy of the basal-septal segment. Indeterminate diastolic filling due to E-A fusion.      3. Right ventricular systolic function is normal. The right ventricular size is normal. There is mildly elevated pulmonary artery systolic pressure. The estimated right ventricular systolic pressure is 28.7 mmHg.    4. The aortic valve is tricuspid. Aortic valve regurgitation is mild to moderate. Mild aortic valve sclerosis is present, with no evidence of aortic valve stenosis.    5. The inferior vena cava is normal in size with greater than 50% respiratory variability, suggesting right atrial pressure of 3 mmHg.    6. There is a large cystic structure located inferior to the liver, whichcould represent a cyst. Would recommend dedicated liver imaging.    ================= Sx started about 1 week prior to ER presentation.  Was SOB, couldn't work as hard.  Presented to ER, admitted, with CHF, inc Cr, valve findings.  D/w pt about inpatient tx and med changes.   Not SOB currently.  Some BLE edema, but better than prev.  Nsaid cautions. Advised to limit salt.    D/w pt about f/u imaging re: #6 above.    Anatomy re: valve and CHF/cardiorenal considerations d/w pt.    Meds, vitals, and allergies reviewed.   ROS: Per HPI unless specifically indicated in ROS section   GEN: nad, alert and oriented HEENT: ncat NECK: supple w/o LA CV: RRR murmur noted.  PULM: ctab, no inc wob ABD: soft, +bs EXT: 1+ BLE edema SKIN: no acute rash Nontender 4cm mobile mass noted on the skin on the R side of abdomen.    40 minutes were devoted to patient care in this encounter (this includes time spent reviewing the patient's file/history, interviewing and examining the patient, counseling/reviewing plan with patient).

## 2020-12-07 NOTE — Telephone Encounter (Signed)
Patient seen in office today with Dr. [name].

## 2020-12-11 DIAGNOSIS — K668 Other specified disorders of peritoneum: Secondary | ICD-10-CM | POA: Insufficient documentation

## 2020-12-11 NOTE — Assessment & Plan Note (Signed)
With likely contribution to CHF and inc Cr.  D/w pt about options.  We'll check labs today and check on cards/renal f/u.  No change in meds at this point, continue amlodipine, lipitor, metoprolol.  Okay for outpatient f/u.

## 2020-12-11 NOTE — Assessment & Plan Note (Signed)
Also with nontender 4cm mobile mass noted on the skin on the R side of abdomen- will see if that can be concurrently imaged on f/u liver imaging.

## 2020-12-11 NOTE — Assessment & Plan Note (Signed)
Off ace/arb, would avoid nsaids, see f/u labs.

## 2020-12-25 ENCOUNTER — Ambulatory Visit
Admission: RE | Admit: 2020-12-25 | Discharge: 2020-12-25 | Disposition: A | Discharge status: Home / Self Care | Payer: Medicare PPO | Source: Ambulatory Visit | Attending: Family Medicine | Admitting: Family Medicine

## 2020-12-25 ENCOUNTER — Other Ambulatory Visit: Payer: Self-pay

## 2020-12-25 DIAGNOSIS — K8020 Calculus of gallbladder without cholecystitis without obstruction: Secondary | ICD-10-CM | POA: Diagnosis not present

## 2020-12-25 DIAGNOSIS — N281 Cyst of kidney, acquired: Secondary | ICD-10-CM | POA: Diagnosis not present

## 2020-12-25 DIAGNOSIS — K668 Other specified disorders of peritoneum: Secondary | ICD-10-CM

## 2020-12-25 MED ORDER — GADOBUTROL 1 MMOL/ML IV SOLN
7.0000 mL | Freq: Once | INTRAVENOUS | Status: AC | PRN
  Administered 2020-12-25: 7 mL via INTRAVENOUS

## 2021-01-17 DIAGNOSIS — I129 Hypertensive chronic kidney disease with stage 1 through stage 4 chronic kidney disease, or unspecified chronic kidney disease: Secondary | ICD-10-CM | POA: Diagnosis not present

## 2021-01-17 DIAGNOSIS — N2581 Secondary hyperparathyroidism of renal origin: Secondary | ICD-10-CM | POA: Diagnosis not present

## 2021-01-17 DIAGNOSIS — D631 Anemia in chronic kidney disease: Secondary | ICD-10-CM | POA: Diagnosis not present

## 2021-01-17 DIAGNOSIS — R809 Proteinuria, unspecified: Secondary | ICD-10-CM | POA: Diagnosis not present

## 2021-01-17 DIAGNOSIS — R609 Edema, unspecified: Secondary | ICD-10-CM | POA: Diagnosis not present

## 2021-01-17 DIAGNOSIS — Q613 Polycystic kidney, unspecified: Secondary | ICD-10-CM | POA: Diagnosis not present

## 2021-01-17 DIAGNOSIS — N200 Calculus of kidney: Secondary | ICD-10-CM | POA: Diagnosis not present

## 2021-01-17 DIAGNOSIS — E871 Hypo-osmolality and hyponatremia: Secondary | ICD-10-CM | POA: Diagnosis not present

## 2021-01-17 DIAGNOSIS — N184 Chronic kidney disease, stage 4 (severe): Secondary | ICD-10-CM | POA: Diagnosis not present

## 2021-01-22 ENCOUNTER — Ambulatory Visit (INDEPENDENT_AMBULATORY_CARE_PROVIDER_SITE_OTHER): Payer: Medicare PPO | Admitting: Ophthalmology

## 2021-01-22 ENCOUNTER — Other Ambulatory Visit: Payer: Self-pay

## 2021-01-22 ENCOUNTER — Encounter (INDEPENDENT_AMBULATORY_CARE_PROVIDER_SITE_OTHER): Payer: Self-pay | Admitting: Ophthalmology

## 2021-01-22 DIAGNOSIS — H35011 Changes in retinal vascular appearance, right eye: Secondary | ICD-10-CM | POA: Diagnosis not present

## 2021-01-22 DIAGNOSIS — H3561 Retinal hemorrhage, right eye: Secondary | ICD-10-CM | POA: Diagnosis not present

## 2021-01-22 NOTE — Progress Notes (Signed)
01/22/2021     CHIEF COMPLAINT Patient presents for Retina Follow Up   HISTORY OF PRESENT ILLNESS: Rick [name] is a 87 y.o. male who presents to the clinic today for:   HPI     Retina Follow Up           Diagnosis: Other   Laterality: both eyes   Onset: 6 months ago   Severity: mild   Duration: 6 months   Course: stable         Comments   6 month fu OU and OCT Pt states VA OU stable since last visit. Pt denies FOL, floaters, or ocular pain OU.  Pt reports using Latanoprost QHS OU       Last edited by Kendra Opitz, COA on 01/22/2021  9:48 AM.      Referring physician: Tonia Ghent, MD [address] [place],  [place] [address]  HISTORICAL INFORMATION:   Selected notes from the MEDICAL RECORD NUMBER       CURRENT MEDICATIONS: Current Outpatient Medications (Ophthalmic Drugs)  Medication Sig   latanoprost (XALATAN) 0.005 % ophthalmic solution Place 1 drop into both eyes at bedtime.   No current facility-administered medications for this visit. (Ophthalmic Drugs)   Current Outpatient Medications (Other)  Medication Sig   amLODipine (NORVASC) 5 MG tablet Take 1 tablet (5 mg total) by mouth daily.   atorvastatin (LIPITOR) 10 MG tablet Take 1 tablet (10 mg total) by mouth at bedtime.   metoprolol succinate (TOPROL-XL) 50 MG 24 hr tablet Take with or immediately following a meal.   omeprazole (PRILOSEC OTC) 20 MG tablet Take 1 tablet (20 mg total) by mouth daily.   psyllium (REGULOID) 0.52 g capsule Take 0.52 g by mouth daily.   No current facility-administered medications for this visit. (Other)      REVIEW OF SYSTEMS:    ALLERGIES Allergies  Allergen Reactions   Lisinopril     cough   Nsaids     Elevated Cr    PAST MEDICAL HISTORY Past Medical History:  Diagnosis Date   Arthritis    GERD (gastroesophageal reflux disease)    History of colon polyps    History of prostate cancer    Hyperlipidemia    Hypertension    Kidney  cysts    bilateral.  Renal US   Mitral regurgitation 12/02/2020   Past Surgical History:  Procedure Laterality Date   COLONOSCOPY     CYSTOSCOPY  02/12/99   biopsy   ESOPHAGEAL BANDING N/A 08/04/2012   Procedure: ESOPHAGEAL BANDING;  Surgeon: Inda Castle, MD;  Location: [facility];  Service: Endoscopy;  Laterality: N/A;   ESOPHAGOGASTRODUODENOSCOPY N/A 08/04/2012   Procedure: ESOPHAGOGASTRODUODENOSCOPY (EGD);  Surgeon: Inda Castle, MD;  Location: [facility];  Service: Endoscopy;  Laterality: N/A;   INGUINAL HERNIA REPAIR  02/1999   Dr. Reece Agar   PROSTATECTOMY  02/1999    FAMILY HISTORY Family History  Problem Relation Age of Onset   Bone cancer Sister    Stomach cancer Brother    Stomach cancer Brother    Lung cancer Sister    Colon cancer Sister    Stroke Mother    Colon cancer Other        nephew    SOCIAL HISTORY Social History   Tobacco Use   Smoking status: Former    Types: Cigarettes    Quit date: 08/26/2001    Years since quitting: 19.4   Smokeless tobacco: Current  Types: Chew   Tobacco comments:    quit about 10 years or more  Vaping Use   Vaping Use: Never used  Substance Use Topics   Alcohol use: Yes   Drug use: No         OPHTHALMIC EXAM:  Base Eye Exam     Visual Acuity (ETDRS)       Right Left   Dist cc 20/80 -2 20/60 -1   Dist ph cc NI 20/50 ecc         Tonometry (Tonopen, 9:51 AM)       Right Left   Pressure 14 15         Pupils       Pupils Dark Light Shape React APD   Right PERRL 3 2 Round Minimal None   Left PERRL 3 2 Round Minimal None         Visual Fields       Left Right    Full Full         Extraocular Movement       Right Left    Full, Ortho Full, Ortho         Neuro/Psych     Oriented x3: Yes   Mood/Affect: Normal         Dilation     Both eyes: 1.0% Mydriacyl, 2.5% Phenylephrine @ 9:52 AM           Slit Lamp and Fundus Exam     External Exam       Right Left    External Normal Normal         Slit Lamp Exam       Right Left   Lids/Lashes Normal Normal   Conjunctiva/Sclera White and quiet White and quiet   Cornea Clear Clear   Anterior Chamber Deep and quiet Deep and quiet   Iris Round and reactive Round and reactive   Anterior Vitreous Normal Normal         Fundus Exam       Right Left   Posterior Vitreous Posterior vitreous detachment Posterior vitreous detachment   Disc Normal Normal   C/D Ratio 0.45 0.7   Macula Normal Normal   Vessels Small area superior to the nerve now completely resolved resolved  Normal   Periphery  Central white, RAM, fibrotic resolved Normal            IMAGING AND PROCEDURES  Imaging and Procedures for 01/22/21  OCT, Retina - OU - Both Eyes       Right Eye Quality was good. Scan locations included subfoveal. Central Foveal Thickness: 253. Progression has improved. Findings include cystoid macular edema, intraretinal fluid.   Left Eye Quality was good. Scan locations included subfoveal. Central Foveal Thickness: 274. Findings include no SRF.   Notes Much less macular thickening now nasal aspect of the fovea as retinal macro aneurysm has continued to spontaneously resolved superonasal to the optic nerve.  Will observe OD  OS stable             ASSESSMENT/PLAN:  Retinal macroaneurysm of right eye Recent RAM noted superior nasal to the nerve now resolved     ICD-10-CM   1. Retinal macroaneurysm of right eye  H35.011 OCT, Retina - OU - Both Eyes    2. Retinal hemorrhage, right eye  H35.61 OCT, Retina - OU - Both Eyes      1.  Recent RAM noted superonasal to the nerve OD resolved completely no  residual hemorrhage  2.  History of large ram Temporally to the macula right eye 2014 also no change able  3.  Ophthalmic Meds Ordered this visit:  No orders of the defined types were placed in this encounter.      Return in about 1 year (around 01/22/2022) for DILATE OU, COLOR  FP.  There are no Patient Instructions on file for this visit.   Explained the diagnoses, plan, and follow up with the patient and they expressed understanding.  Patient expressed understanding of the importance of proper follow up care.   Clent Demark Nayla Dias M.D. Diseases & Surgery of the Retina and Vitreous [facility] [place] 01/22/21     Abbreviations: M myopia (nearsighted); A astigmatism; H hyperopia (farsighted); P presbyopia; Mrx spectacle prescription;  CTL contact lenses; OD right eye; OS left eye; OU both eyes  XT exotropia; ET esotropia; PEK punctate epithelial keratitis; PEE punctate epithelial erosions; DES dry eye syndrome; MGD meibomian gland dysfunction; ATs artificial tears; PFAT's preservative free artificial tears; [place] nuclear sclerotic cataract; PSC posterior subcapsular cataract; ERM epi-retinal membrane; PVD posterior vitreous detachment; RD retinal detachment; DM diabetes mellitus; DR diabetic retinopathy; NPDR non-proliferative diabetic retinopathy; PDR proliferative diabetic retinopathy; CSME clinically significant macular edema; DME diabetic macular edema; dbh dot blot hemorrhages; CWS cotton wool spot; POAG primary open angle glaucoma; C/D cup-to-disc ratio; HVF humphrey visual field; GVF goldmann visual field; OCT optical coherence tomography; IOP intraocular pressure; BRVO Branch retinal vein occlusion; CRVO central retinal vein occlusion; CRAO central retinal artery occlusion; BRAO branch retinal artery occlusion; RT retinal tear; SB scleral buckle; PPV pars plana vitrectomy; VH Vitreous hemorrhage; PRP panretinal laser photocoagulation; IVK intravitreal kenalog; VMT vitreomacular traction; MH Macular hole;  NVD neovascularization of the disc; NVE neovascularization elsewhere; AREDS age related eye disease study; ARMD age related macular degeneration; POAG primary open angle glaucoma; EBMD epithelial/anterior basement membrane dystrophy; ACIOL anterior chamber  intraocular lens; IOL intraocular lens; PCIOL posterior chamber intraocular lens; Phaco/IOL phacoemulsification with intraocular lens placement; [place] photorefractive keratectomy; LASIK laser assisted in situ keratomileusis; HTN hypertension; DM diabetes mellitus; COPD chronic obstructive pulmonary disease

## 2021-01-22 NOTE — Assessment & Plan Note (Signed)
Recent RAM noted superior nasal to the nerve now resolved

## 2021-02-26 ENCOUNTER — Telehealth: Payer: Self-pay | Admitting: Family Medicine

## 2021-02-26 DIAGNOSIS — N184 Chronic kidney disease, stage 4 (severe): Secondary | ICD-10-CM | POA: Diagnosis not present

## 2021-02-26 NOTE — Telephone Encounter (Signed)
LVM for pt to rtn my call to schedule awv with nha. 

## 2021-03-29 DIAGNOSIS — D631 Anemia in chronic kidney disease: Secondary | ICD-10-CM | POA: Diagnosis not present

## 2021-03-29 DIAGNOSIS — N184 Chronic kidney disease, stage 4 (severe): Secondary | ICD-10-CM | POA: Diagnosis not present

## 2021-03-29 DIAGNOSIS — E871 Hypo-osmolality and hyponatremia: Secondary | ICD-10-CM | POA: Diagnosis not present

## 2021-03-29 DIAGNOSIS — Z23 Encounter for immunization: Secondary | ICD-10-CM | POA: Diagnosis not present

## 2021-03-29 DIAGNOSIS — N2581 Secondary hyperparathyroidism of renal origin: Secondary | ICD-10-CM | POA: Diagnosis not present

## 2021-03-29 DIAGNOSIS — I129 Hypertensive chronic kidney disease with stage 1 through stage 4 chronic kidney disease, or unspecified chronic kidney disease: Secondary | ICD-10-CM | POA: Diagnosis not present

## 2021-03-29 DIAGNOSIS — Q613 Polycystic kidney, unspecified: Secondary | ICD-10-CM | POA: Diagnosis not present

## 2021-04-26 DIAGNOSIS — N184 Chronic kidney disease, stage 4 (severe): Secondary | ICD-10-CM | POA: Diagnosis not present

## 2021-05-01 ENCOUNTER — Inpatient Hospital Stay (HOSPITAL_COMMUNITY)
Admission: EM | Admit: 2021-05-01 | Discharge: 2021-05-07 | DRG: 377 | Disposition: A | Discharge status: Home / Self Care | Payer: Medicare PPO | Attending: Internal Medicine | Admitting: Internal Medicine

## 2021-05-01 ENCOUNTER — Telehealth: Payer: Self-pay | Admitting: Nephrology

## 2021-05-01 ENCOUNTER — Encounter (HOSPITAL_COMMUNITY): Payer: Self-pay

## 2021-05-01 DIAGNOSIS — K31A19 Gastric intestinal metaplasia without dysplasia, unspecified site: Secondary | ICD-10-CM | POA: Diagnosis not present

## 2021-05-01 DIAGNOSIS — D509 Iron deficiency anemia, unspecified: Secondary | ICD-10-CM | POA: Diagnosis present

## 2021-05-01 DIAGNOSIS — R140 Abdominal distension (gaseous): Secondary | ICD-10-CM

## 2021-05-01 DIAGNOSIS — K922 Gastrointestinal hemorrhage, unspecified: Principal | ICD-10-CM | POA: Diagnosis present

## 2021-05-01 DIAGNOSIS — I340 Nonrheumatic mitral (valve) insufficiency: Secondary | ICD-10-CM | POA: Diagnosis present

## 2021-05-01 DIAGNOSIS — Z20822 Contact with and (suspected) exposure to covid-19: Secondary | ICD-10-CM | POA: Diagnosis present

## 2021-05-01 DIAGNOSIS — K219 Gastro-esophageal reflux disease without esophagitis: Secondary | ICD-10-CM | POA: Diagnosis present

## 2021-05-01 DIAGNOSIS — Z79899 Other long term (current) drug therapy: Secondary | ICD-10-CM

## 2021-05-01 DIAGNOSIS — E871 Hypo-osmolality and hyponatremia: Secondary | ICD-10-CM | POA: Diagnosis present

## 2021-05-01 DIAGNOSIS — I5033 Acute on chronic diastolic (congestive) heart failure: Secondary | ICD-10-CM | POA: Diagnosis present

## 2021-05-01 DIAGNOSIS — N179 Acute kidney failure, unspecified: Secondary | ICD-10-CM | POA: Diagnosis present

## 2021-05-01 DIAGNOSIS — K449 Diaphragmatic hernia without obstruction or gangrene: Secondary | ICD-10-CM | POA: Diagnosis not present

## 2021-05-01 DIAGNOSIS — Z888 Allergy status to other drugs, medicaments and biological substances status: Secondary | ICD-10-CM

## 2021-05-01 DIAGNOSIS — I472 Ventricular tachycardia, unspecified: Secondary | ICD-10-CM | POA: Diagnosis not present

## 2021-05-01 DIAGNOSIS — E785 Hyperlipidemia, unspecified: Secondary | ICD-10-CM | POA: Diagnosis present

## 2021-05-01 DIAGNOSIS — K2970 Gastritis, unspecified, without bleeding: Secondary | ICD-10-CM

## 2021-05-01 DIAGNOSIS — I272 Pulmonary hypertension, unspecified: Secondary | ICD-10-CM | POA: Diagnosis present

## 2021-05-01 DIAGNOSIS — I130 Hypertensive heart and chronic kidney disease with heart failure and stage 1 through stage 4 chronic kidney disease, or unspecified chronic kidney disease: Secondary | ICD-10-CM | POA: Diagnosis present

## 2021-05-01 DIAGNOSIS — D62 Acute posthemorrhagic anemia: Secondary | ICD-10-CM | POA: Diagnosis present

## 2021-05-01 DIAGNOSIS — I700 Atherosclerosis of aorta: Secondary | ICD-10-CM | POA: Diagnosis not present

## 2021-05-01 DIAGNOSIS — Q613 Polycystic kidney, unspecified: Secondary | ICD-10-CM

## 2021-05-01 DIAGNOSIS — Z886 Allergy status to analgesic agent status: Secondary | ICD-10-CM

## 2021-05-01 DIAGNOSIS — Z8546 Personal history of malignant neoplasm of prostate: Secondary | ICD-10-CM

## 2021-05-01 DIAGNOSIS — E876 Hypokalemia: Secondary | ICD-10-CM | POA: Diagnosis not present

## 2021-05-01 DIAGNOSIS — D631 Anemia in chronic kidney disease: Secondary | ICD-10-CM | POA: Diagnosis present

## 2021-05-01 DIAGNOSIS — K3189 Other diseases of stomach and duodenum: Secondary | ICD-10-CM | POA: Diagnosis not present

## 2021-05-01 DIAGNOSIS — K2990 Gastroduodenitis, unspecified, without bleeding: Secondary | ICD-10-CM | POA: Diagnosis not present

## 2021-05-01 DIAGNOSIS — I10 Essential (primary) hypertension: Secondary | ICD-10-CM | POA: Diagnosis present

## 2021-05-01 DIAGNOSIS — N289 Disorder of kidney and ureter, unspecified: Secondary | ICD-10-CM

## 2021-05-01 DIAGNOSIS — D649 Anemia, unspecified: Secondary | ICD-10-CM | POA: Diagnosis not present

## 2021-05-01 DIAGNOSIS — R195 Other fecal abnormalities: Secondary | ICD-10-CM | POA: Diagnosis not present

## 2021-05-01 DIAGNOSIS — N184 Chronic kidney disease, stage 4 (severe): Secondary | ICD-10-CM | POA: Diagnosis present

## 2021-05-01 DIAGNOSIS — K8020 Calculus of gallbladder without cholecystitis without obstruction: Secondary | ICD-10-CM | POA: Diagnosis not present

## 2021-05-01 DIAGNOSIS — K5730 Diverticulosis of large intestine without perforation or abscess without bleeding: Secondary | ICD-10-CM | POA: Diagnosis present

## 2021-05-01 DIAGNOSIS — F17220 Nicotine dependence, chewing tobacco, uncomplicated: Secondary | ICD-10-CM | POA: Diagnosis present

## 2021-05-01 DIAGNOSIS — Z66 Do not resuscitate: Secondary | ICD-10-CM | POA: Diagnosis present

## 2021-05-01 DIAGNOSIS — K921 Melena: Secondary | ICD-10-CM | POA: Diagnosis not present

## 2021-05-01 DIAGNOSIS — N189 Chronic kidney disease, unspecified: Secondary | ICD-10-CM | POA: Diagnosis present

## 2021-05-01 DIAGNOSIS — K317 Polyp of stomach and duodenum: Secondary | ICD-10-CM | POA: Diagnosis present

## 2021-05-01 DIAGNOSIS — K5520 Angiodysplasia of colon without hemorrhage: Secondary | ICD-10-CM | POA: Diagnosis present

## 2021-05-01 LAB — BASIC METABOLIC PANEL
Anion gap: 10 (ref 5–15)
BUN: 26 mg/dL — ABNORMAL HIGH (ref 8–23)
CO2: 17 mmol/L — ABNORMAL LOW (ref 22–32)
Calcium: 8 mg/dL — ABNORMAL LOW (ref 8.9–10.3)
Chloride: 95 mmol/L — ABNORMAL LOW (ref 98–111)
Creatinine, Ser: 3.26 mg/dL — ABNORMAL HIGH (ref 0.61–1.24)
GFR, Estimated: 17 mL/min — ABNORMAL LOW (ref >60)
Glucose, Bld: 111 mg/dL — ABNORMAL HIGH (ref 70–99)
Potassium: 3.8 mmol/L (ref 3.5–5.1)
Sodium: 122 mmol/L — ABNORMAL LOW (ref 135–145)

## 2021-05-01 LAB — CBC WITH DIFFERENTIAL/PLATELET
Abs Immature Granulocytes: 0.04 10*3/uL (ref 0.00–0.07)
Basophils Absolute: 0 10*3/uL (ref 0.0–0.1)
Basophils Relative: 0 %
Eosinophils Absolute: 0.2 10*3/uL (ref 0.0–0.5)
Eosinophils Relative: 3 %
HCT: 18.9 % — ABNORMAL LOW (ref 39.0–52.0)
Hemoglobin: 6.4 g/dL — CL (ref 13.0–17.0)
Immature Granulocytes: 1 %
Lymphocytes Relative: 9 %
Lymphs Abs: 0.6 10*3/uL — ABNORMAL LOW (ref 0.7–4.0)
MCH: 31.7 pg (ref 26.0–34.0)
MCHC: 33.9 g/dL (ref 30.0–36.0)
MCV: 93.6 fL (ref 80.0–100.0)
Monocytes Absolute: 0.6 10*3/uL (ref 0.1–1.0)
Monocytes Relative: 9 %
Neutro Abs: 5.3 10*3/uL (ref 1.7–7.7)
Neutrophils Relative %: 78 %
Platelets: 281 10*3/uL (ref 150–400)
RBC: 2.02 MIL/uL — ABNORMAL LOW (ref 4.22–5.81)
RDW: 16.1 % — ABNORMAL HIGH (ref 11.5–15.5)
WBC: 6.9 10*3/uL (ref 4.0–10.5)
nRBC: 0 % (ref 0.0–0.2)

## 2021-05-01 LAB — OSMOLALITY, URINE: Osmolality, Ur: 350 mOsm/kg (ref 300–900)

## 2021-05-01 LAB — RESP PANEL BY RT-PCR (FLU A&B, COVID) ARPGX2
Influenza A by PCR: NEGATIVE
Influenza B by PCR: NEGATIVE
SARS Coronavirus 2 by RT PCR: NEGATIVE

## 2021-05-01 LAB — CREATININE, URINE, RANDOM: Creatinine, Urine: 91.9 mg/dL

## 2021-05-01 LAB — OSMOLALITY: Osmolality: 265 mOsm/kg — ABNORMAL LOW (ref 275–295)

## 2021-05-01 LAB — ABO/RH: ABO/RH(D): B POS

## 2021-05-01 LAB — SODIUM, URINE, RANDOM: Sodium, Ur: 36 mmol/L

## 2021-05-01 NOTE — ED Provider Notes (Signed)
Emergency Medicine Provider Triage Evaluation Note  Rick [name] , a 87 y.o. male  was evaluated in triage.  Pt complains of hyponatremia. Outpatient labs drawn by nephrology showed persistent hyponatremia despite fluid restriction this week.  Review of Systems  Positive: No sxs Negative: seizures  Physical Exam  BP 117/63 (BP Location: Left Arm)   Pulse 65   Temp 97.8 F (36.6 C)   Resp 15   SpO2 98%  Gen:   Awake, no distress   Resp:  Normal effort  MSK:   Moves extremities without difficulty  Other:  Edema to the bueble  Medical Decision Making  Medically screening exam initiated at 8:05 PM.  Appropriate orders placed.  ELIU BATCH was informed that the remainder of the evaluation will be completed by another provider, this initial triage assessment does not replace that evaluation, and the importance of remaining in the ED until their evaluation is complete.     Bishop Dublin 05/01/21 2009    Regan Lemming, MD 05/01/21 2018

## 2021-05-01 NOTE — Telephone Encounter (Signed)
Patient followed by me at the office. Have been following for advanced CKD, PCKD.  Has been having worsening hyponatremia, Na now down to 123 today, despite fluid restriction (was 124 last week). Cr relatively stable around 3.2 with a eGFR of 18%. Has been having excessive fatigue and has been sleeping more than usual. Discussed with his wife [name]. At this junction, I recommended to her that the proceed with going to the ER for an expedited evaluation. Recommend rechecking labs, assessing volume status for diuretic need, serum osm, urine osm, urine Na, TSH, uric acid level. Please call on-call nephrologist if needed.  Gean Quint, MD [facility]

## 2021-05-01 NOTE — ED Triage Notes (Signed)
Pt states that he went to see his PCP for normal check up and called and told his NA level was low and told to come here

## 2021-05-02 ENCOUNTER — Encounter (HOSPITAL_COMMUNITY): Payer: Self-pay | Admitting: Family Medicine

## 2021-05-02 ENCOUNTER — Inpatient Hospital Stay (HOSPITAL_COMMUNITY): Payer: Medicare PPO

## 2021-05-02 ENCOUNTER — Other Ambulatory Visit: Payer: Self-pay

## 2021-05-02 DIAGNOSIS — K921 Melena: Secondary | ICD-10-CM

## 2021-05-02 DIAGNOSIS — K922 Gastrointestinal hemorrhage, unspecified: Secondary | ICD-10-CM | POA: Diagnosis present

## 2021-05-02 DIAGNOSIS — E785 Hyperlipidemia, unspecified: Secondary | ICD-10-CM | POA: Diagnosis present

## 2021-05-02 DIAGNOSIS — E871 Hypo-osmolality and hyponatremia: Secondary | ICD-10-CM

## 2021-05-02 DIAGNOSIS — K219 Gastro-esophageal reflux disease without esophagitis: Secondary | ICD-10-CM | POA: Diagnosis present

## 2021-05-02 DIAGNOSIS — Q613 Polycystic kidney, unspecified: Secondary | ICD-10-CM | POA: Diagnosis not present

## 2021-05-02 DIAGNOSIS — D62 Acute posthemorrhagic anemia: Secondary | ICD-10-CM

## 2021-05-02 DIAGNOSIS — K2970 Gastritis, unspecified, without bleeding: Secondary | ICD-10-CM | POA: Diagnosis not present

## 2021-05-02 DIAGNOSIS — I5033 Acute on chronic diastolic (congestive) heart failure: Secondary | ICD-10-CM

## 2021-05-02 DIAGNOSIS — Z8546 Personal history of malignant neoplasm of prostate: Secondary | ICD-10-CM | POA: Diagnosis not present

## 2021-05-02 DIAGNOSIS — D509 Iron deficiency anemia, unspecified: Secondary | ICD-10-CM | POA: Diagnosis present

## 2021-05-02 DIAGNOSIS — I340 Nonrheumatic mitral (valve) insufficiency: Secondary | ICD-10-CM | POA: Diagnosis present

## 2021-05-02 DIAGNOSIS — D631 Anemia in chronic kidney disease: Secondary | ICD-10-CM | POA: Diagnosis present

## 2021-05-02 DIAGNOSIS — N179 Acute kidney failure, unspecified: Secondary | ICD-10-CM | POA: Diagnosis present

## 2021-05-02 DIAGNOSIS — N184 Chronic kidney disease, stage 4 (severe): Secondary | ICD-10-CM | POA: Diagnosis present

## 2021-05-02 DIAGNOSIS — I472 Ventricular tachycardia, unspecified: Secondary | ICD-10-CM | POA: Diagnosis not present

## 2021-05-02 DIAGNOSIS — K317 Polyp of stomach and duodenum: Secondary | ICD-10-CM | POA: Diagnosis present

## 2021-05-02 DIAGNOSIS — K2990 Gastroduodenitis, unspecified, without bleeding: Secondary | ICD-10-CM | POA: Diagnosis not present

## 2021-05-02 DIAGNOSIS — F17220 Nicotine dependence, chewing tobacco, uncomplicated: Secondary | ICD-10-CM | POA: Diagnosis present

## 2021-05-02 DIAGNOSIS — Z888 Allergy status to other drugs, medicaments and biological substances status: Secondary | ICD-10-CM | POA: Diagnosis not present

## 2021-05-02 DIAGNOSIS — Z66 Do not resuscitate: Secondary | ICD-10-CM | POA: Diagnosis present

## 2021-05-02 DIAGNOSIS — I130 Hypertensive heart and chronic kidney disease with heart failure and stage 1 through stage 4 chronic kidney disease, or unspecified chronic kidney disease: Secondary | ICD-10-CM | POA: Diagnosis present

## 2021-05-02 DIAGNOSIS — Z20822 Contact with and (suspected) exposure to covid-19: Secondary | ICD-10-CM | POA: Diagnosis present

## 2021-05-02 DIAGNOSIS — E876 Hypokalemia: Secondary | ICD-10-CM | POA: Diagnosis not present

## 2021-05-02 DIAGNOSIS — Z79899 Other long term (current) drug therapy: Secondary | ICD-10-CM | POA: Diagnosis not present

## 2021-05-02 DIAGNOSIS — Z886 Allergy status to analgesic agent status: Secondary | ICD-10-CM | POA: Diagnosis not present

## 2021-05-02 DIAGNOSIS — I272 Pulmonary hypertension, unspecified: Secondary | ICD-10-CM | POA: Diagnosis present

## 2021-05-02 HISTORY — DX: Acute on chronic diastolic (congestive) heart failure: I50.33

## 2021-05-02 LAB — IRON AND TIBC
Iron: 35 ug/dL — ABNORMAL LOW (ref 45–182)
Saturation Ratios: 15 % — ABNORMAL LOW (ref 17.9–39.5)
TIBC: 231 ug/dL — ABNORMAL LOW (ref 250–450)
UIBC: 196 ug/dL

## 2021-05-02 LAB — BASIC METABOLIC PANEL
Anion gap: 7 (ref 5–15)
Anion gap: 11 (ref 5–15)
BUN: 25 mg/dL — ABNORMAL HIGH (ref 8–23)
BUN: 27 mg/dL — ABNORMAL HIGH (ref 8–23)
CO2: 20 mmol/L — ABNORMAL LOW (ref 22–32)
CO2: 20 mmol/L — ABNORMAL LOW (ref 22–32)
Calcium: 7.9 mg/dL — ABNORMAL LOW (ref 8.9–10.3)
Calcium: 8 mg/dL — ABNORMAL LOW (ref 8.9–10.3)
Chloride: 96 mmol/L — ABNORMAL LOW (ref 98–111)
Chloride: 95 mmol/L — ABNORMAL LOW (ref 98–111)
Creatinine, Ser: 3.21 mg/dL — ABNORMAL HIGH (ref 0.61–1.24)
Creatinine, Ser: 3.1 mg/dL — ABNORMAL HIGH (ref 0.61–1.24)
GFR, Estimated: 18 mL/min — ABNORMAL LOW (ref >60)
GFR, Estimated: 18 mL/min — ABNORMAL LOW (ref >60)
Glucose, Bld: 97 mg/dL (ref 70–99)
Glucose, Bld: 110 mg/dL — ABNORMAL HIGH (ref 70–99)
Potassium: 3.6 mmol/L (ref 3.5–5.1)
Potassium: 3.5 mmol/L (ref 3.5–5.1)
Sodium: 123 mmol/L — ABNORMAL LOW (ref 135–145)
Sodium: 126 mmol/L — ABNORMAL LOW (ref 135–145)

## 2021-05-02 LAB — FERRITIN: Ferritin: 87 ng/mL (ref 24–336)

## 2021-05-02 LAB — BRAIN NATRIURETIC PEPTIDE: B Natriuretic Peptide: 318.9 pg/mL — ABNORMAL HIGH (ref 0.0–100.0)

## 2021-05-02 LAB — CBC
HCT: 19.1 % — ABNORMAL LOW (ref 39.0–52.0)
HCT: 23.1 % — ABNORMAL LOW (ref 39.0–52.0)
Hemoglobin: 6.1 g/dL — CL (ref 13.0–17.0)
Hemoglobin: 7.7 g/dL — ABNORMAL LOW (ref 13.0–17.0)
MCH: 30.3 pg (ref 26.0–34.0)
MCH: 30.3 pg (ref 26.0–34.0)
MCHC: 31.9 g/dL (ref 30.0–36.0)
MCHC: 33.3 g/dL (ref 30.0–36.0)
MCV: 95 fL (ref 80.0–100.0)
MCV: 90.9 fL (ref 80.0–100.0)
Platelets: 263 10*3/uL (ref 150–400)
Platelets: 233 10*3/uL (ref 150–400)
RBC: 2.01 MIL/uL — ABNORMAL LOW (ref 4.22–5.81)
RBC: 2.54 MIL/uL — ABNORMAL LOW (ref 4.22–5.81)
RDW: 16.3 % — ABNORMAL HIGH (ref 11.5–15.5)
RDW: 17.2 % — ABNORMAL HIGH (ref 11.5–15.5)
WBC: 6.8 10*3/uL (ref 4.0–10.5)
WBC: 6.2 10*3/uL (ref 4.0–10.5)
nRBC: 0 % (ref 0.0–0.2)
nRBC: 0 % (ref 0.0–0.2)

## 2021-05-02 LAB — SODIUM, URINE, RANDOM: Sodium, Ur: 73 mmol/L

## 2021-05-02 LAB — OSMOLALITY: Osmolality: 267 mOsm/kg — ABNORMAL LOW (ref 275–295)

## 2021-05-02 LAB — VITAMIN B12: Vitamin B-12: 771 pg/mL (ref 180–914)

## 2021-05-02 LAB — RETICULOCYTES
Immature Retic Fract: 21.4 % — ABNORMAL HIGH (ref 2.3–15.9)
RBC.: 2.06 MIL/uL — ABNORMAL LOW (ref 4.22–5.81)
Retic Count, Absolute: 78.7 10*3/uL (ref 19.0–186.0)
Retic Ct Pct: 3.8 % — ABNORMAL HIGH (ref 0.4–3.1)

## 2021-05-02 LAB — PREPARE RBC (CROSSMATCH)

## 2021-05-02 LAB — FOLATE: Folate: 10.4 ng/mL (ref >5.9)

## 2021-05-02 LAB — POC OCCULT BLOOD, ED: Fecal Occult Bld: POSITIVE — AB

## 2021-05-02 MED ORDER — FUROSEMIDE 10 MG/ML IJ SOLN
80.0000 mg | Freq: Once | INTRAMUSCULAR | Status: AC
  Administered 2021-05-02: 80 mg via INTRAVENOUS
  Filled 2021-05-02: qty 8

## 2021-05-02 MED ORDER — FUROSEMIDE 10 MG/ML IJ SOLN
40.0000 mg | Freq: Two times a day (BID) | INTRAMUSCULAR | Status: DC
  Administered 2021-05-02: 40 mg via INTRAVENOUS
  Filled 2021-05-02: qty 4

## 2021-05-02 MED ORDER — LOSARTAN POTASSIUM 50 MG PO TABS: 100.0000 mg | ORAL_TABLET | Freq: Every day | ORAL | Status: DC

## 2021-05-02 MED ORDER — PANTOPRAZOLE SODIUM 40 MG IV SOLR
40.0000 mg | Freq: Two times a day (BID) | INTRAVENOUS | Status: DC
  Administered 2021-05-02 – 2021-05-06 (×10): 40 mg via INTRAVENOUS
  Filled 2021-05-02 (×10): qty 40

## 2021-05-02 MED ORDER — ACETAMINOPHEN 325 MG PO TABS: 650.0000 mg | ORAL_TABLET | Freq: Four times a day (QID) | ORAL | Status: DC | PRN

## 2021-05-02 MED ORDER — SODIUM CHLORIDE 0.9 % IV SOLN: 250.0000 mL | INTRAVENOUS | Status: DC | PRN

## 2021-05-02 MED ORDER — AMLODIPINE BESYLATE 5 MG PO TABS
10.0000 mg | ORAL_TABLET | Freq: Every day | ORAL | Status: DC
  Administered 2021-05-02: 10 mg via ORAL
  Filled 2021-05-02: qty 2

## 2021-05-02 MED ORDER — HYDRALAZINE HCL 25 MG PO TABS
25.0000 mg | ORAL_TABLET | Freq: Three times a day (TID) | ORAL | Status: DC
  Administered 2021-05-02: 25 mg via ORAL
  Filled 2021-05-02: qty 1

## 2021-05-02 MED ORDER — SODIUM CHLORIDE 0.9% FLUSH: 3.0000 mL | INTRAVENOUS | Status: DC | PRN

## 2021-05-02 MED ORDER — SODIUM CHLORIDE 0.9 % IV SOLN
10.0000 mL/h | Freq: Once | INTRAVENOUS | Status: AC
  Administered 2021-05-02: 10 mL/h via INTRAVENOUS

## 2021-05-02 MED ORDER — ATORVASTATIN CALCIUM 10 MG PO TABS
10.0000 mg | ORAL_TABLET | Freq: Every day | ORAL | Status: DC
  Administered 2021-05-02 – 2021-05-06 (×5): 10 mg via ORAL
  Filled 2021-05-02 (×5): qty 1

## 2021-05-02 MED ORDER — LATANOPROST 0.005 % OP SOLN
1.0000 [drp] | Freq: Every day | OPHTHALMIC | Status: DC
  Administered 2021-05-04 – 2021-05-06 (×3): 1 [drp] via OPHTHALMIC
  Filled 2021-05-02 (×2): qty 2.5

## 2021-05-02 MED ORDER — ACETAMINOPHEN 650 MG RE SUPP: 650.0000 mg | Freq: Four times a day (QID) | RECTAL | Status: DC | PRN

## 2021-05-02 MED ORDER — SODIUM CHLORIDE 0.9% FLUSH
3.0000 mL | Freq: Two times a day (BID) | INTRAVENOUS | Status: DC
  Administered 2021-05-02 – 2021-05-07 (×10): 3 mL via INTRAVENOUS

## 2021-05-02 NOTE — Consult Note (Addendum)
Referring Provider: [facility] PCP: Tonia Ghent, MD  Gastroenterologist:  Harl Bowie, MD Reason for consultation: GI bleed                  ASSESSMENT / PLAN   #87 year old male with multiple medical problems admitted with hyponatremia, AKI on CKD, CHF, recurrent anemia with FOBT positive dark stools at home.  Hemoglobin 12.6 ( July) >> 6.4. Received 2 uPRBC,  hgb improved to 7.7. He could have another bleeding gastric polyp. Other etiology such as PUD, AVM, malignancy also considerations --Will probably need EGD this admission when CHF and hyponatremia improve. -- BID PPI  -- Monitor CBC, transfuse additional blood as needed.  #  Hyponatremia, work-up in progress . Na+ 123              #  Acute on chronic heart failure with preserved EF. He has peripheral edema, mild abdominal wall edema and possibly ascites?  Got one dose of lasix today. So far urine output is 2175 ml.  --Abdominal US, evaluate for ascites / abnormal abdominal exam  # Polycystic kidney disease. MRI July 2022 remarkable for bilateral renal cysts. One of the largest measuring over 20 cm and occupying entire right renal parenchyma. Also multiple cystic renal complexes in left kidney.   # Cholelithiasis, MRI in July 2022 remarkable for a 4.9 cm stone in gallbladder  # Additional medical history listed below.   HISTORY OF PRESENT ILLNESS                                                                                                                         Chief Complaint:  anemia  Rick [name] is a 87 y.o. male with a past medical history significant for small adenomatous colon polyps, diverticulosis, gastric polyps, diastolic heart failure, aortic atherosclerosis , HLD CKD stage IV, kidney stones , history of prostate cancer , polycystic kidney disease , hypertension, glaucoma, cholelithiasis see [facility] for any additional medical problems.   Rick [name] was evaluated by [place] in 2013-2014 for heme  positive stool.  Colonoscopy revealing only of a nonbleeding cecal AVM and very small transverse colon tubular adenoma.  He was found to have a bleeding hyperplastic gastric polyp which we banded. We saw him again in the office November 2016 for anemia and positive IFOBT.   Prior to that visit patient had been started on oral iron with improvement in hemoglobin from the 8-10 range up to 12 .  Since he had had a good response to oral iron we decided to hold off on repeat endoscopic evaluation  ED visit:  Patient presented to the ED yesterday upon the recommendation of his PCP for low sodium level.  In the ED he complained of fatigue and reported black tarry stools over the last few weeks.   WBC 2.0, hemoglobin 6.4 (down from 12.6 in July), MCV 93, platelets 281.  Sodium 122, BUN 26, creatinine 3.26, BNP 318.  FOBT positive  Rick [name] is hard of hearing so history taking was difficult. He gives ~ 1 week history of very dark bowel movements. He doesn't think he takes iron at home. He does not take NSAIDs as advised by his PCP.  He has not had any nausea or vomiting and says his appetite is fine and weight is stable.  About a week or so ago he had generalized abdominal pain shortly after eating a salad.  No abdominal pain since.  He has occasional constipation for which he takes laxatives.  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  No blood in colon.  A 3 mm transverse colon polyp (tubular adenoma).  Moderately severe sigmoid diverticulosis  Small bowel endoscopy November 2013 for Hemoccult positive stool  -- Extent of exam to the proximal jejunum and remarkable for a bleeding polyp in the cardia and a hiatal hernia small bowel endoscopy November 2013 for heme positive stool  EGD February 2014 --Friable, pedunculated gastric  polyp treated with 3 bands  Past Medical History:  Diagnosis Date   Arthritis    GERD (gastroesophageal reflux disease)    History of colon polyps    History of prostate cancer    Hyperlipidemia    Hypertension    Kidney cysts    bilateral.  Renal US   Mitral regurgitation 12/02/2020    Past Surgical History:  Procedure Laterality Date   COLONOSCOPY     CYSTOSCOPY  02/12/99   biopsy   ESOPHAGEAL BANDING N/A 08/04/2012   Procedure: ESOPHAGEAL BANDING;  Surgeon: Inda Castle, MD;  Location: [facility];  Service: Endoscopy;  Laterality: N/A;   ESOPHAGOGASTRODUODENOSCOPY N/A 08/04/2012   Procedure: ESOPHAGOGASTRODUODENOSCOPY (EGD);  Surgeon: Inda Castle, MD;  Location: [facility];  Service: Endoscopy;  Laterality: N/A;   INGUINAL HERNIA REPAIR  02/1999   Dr. Reece Agar   PROSTATECTOMY  02/1999    Prior to Admission medications   Medication Sig Start Date End Date Taking? Authorizing Provider  amLODipine (NORVASC) 10 MG tablet Take 10 mg by mouth daily. 03/29/21  Yes [provider]  atorvastatin (LIPITOR) 10 MG tablet Take 1 tablet (10 mg total) by mouth at bedtime. 12/07/20  Yes Tonia Ghent, MD  hydrALAZINE (APRESOLINE) 25 MG tablet Take 25 mg by mouth 3 (three) times daily. 04/09/21  Yes [provider]  latanoprost (XALATAN) 0.005 % ophthalmic solution Place 1 drop into both eyes at bedtime.   Yes [provider]  losartan (COZAAR) 100 MG tablet Take 100 mg by mouth daily. 01/31/21  Yes [provider]  omeprazole (PRILOSEC OTC) 20 MG tablet Take 1 tablet (20 mg total) by mouth daily. 12/07/20  Yes Tonia Ghent, MD  sodium bicarbonate 650 MG tablet Take 1,250 mg by mouth See admin instructions. 2 tabs bid x 8 days 04/30/21  Yes [provider]  amLODipine (NORVASC) 5 MG tablet Take 1 tablet (5 mg total) by mouth daily. Patient not taking: Reported on 05/02/2021 12/02/20   Samuella Cota, MD  metoprolol succinate (TOPROL-XL)  50 MG 24 hr tablet Take with or immediately following a meal. Patient not taking: Reported on 05/02/2021 05/28/20   Tonia Ghent, MD    Current Facility-Administered Medications  Medication Dose Route Frequency Provider Last Rate Last Admin  0.9 %  sodium chloride infusion  250 mL Intravenous PRN Chotiner, Yevonne Aline, MD       acetaminophen (TYLENOL) tablet 650 mg  650 mg Oral Q6H PRN Chotiner, Yevonne Aline, MD       Or   acetaminophen (TYLENOL) suppository 650 mg  650 mg Rectal Q6H PRN Chotiner, Yevonne Aline, MD       atorvastatin (LIPITOR) tablet 10 mg  10 mg Oral QHS Chotiner, Yevonne Aline, MD       latanoprost (XALATAN) 0.005 % ophthalmic solution 1 drop  1 drop Both Eyes QHS Chotiner, Yevonne Aline, MD       pantoprazole (PROTONIX) injection 40 mg  40 mg Intravenous Q12H Chotiner, Yevonne Aline, MD   40 mg at 05/02/21 0905   sodium chloride flush (NS) 0.9 % injection 3 mL  3 mL Intravenous Q12H Chotiner, Yevonne Aline, MD   3 mL at 05/02/21 0905   sodium chloride flush (NS) 0.9 % injection 3 mL  3 mL Intravenous PRN Chotiner, Yevonne Aline, MD        Allergies as of 05/01/2021 - Review Complete 05/01/2021  Allergen Reaction Noted   Lisinopril  03/22/2012   Nsaids  12/07/2020    Family History  Problem Relation Age of Onset   Bone cancer Sister    Stomach cancer Brother    Stomach cancer Brother    Lung cancer Sister    Colon cancer Sister    Stroke Mother    Colon cancer Other        nephew    Social History   Socioeconomic History   Marital status: Married    Spouse name: Not on file   Number of children: 6   Years of education: Not on file   Highest education level: Not on file  Occupational History   Occupation: retired- Patent attorney, Biochemist, clinical    Comment: at [employer], [employer] Therapist, art: RETIRED  Tobacco Use   Smoking status: Former    Types: Cigarettes    Quit date: 08/26/2001    Years since quitting: 19.6   Smokeless tobacco: Current     Types: Chew   Tobacco comments:    quit about 10 years or more  Vaping Use   Vaping Use: Never used  Substance and Sexual Activity   Alcohol use: Yes   Drug use: No   Sexual activity: Not Currently  Other Topics Concern   Not on file  Social History Narrative   Retired Psychologist, sport and exercise, worked at [employer], [place]   Married 1955   6 kids initially, oldest daughter died after surgery   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, 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   Social Determinants of Radio broadcast assistant Strain: Not on Art therapist Insecurity: Not on file  Transportation Needs: Not on file  Physical Activity: Not on file  Stress: Not on file  Social Connections: Not on file  Intimate Partner Violence: Not on file    Review of Systems: All systems reviewed and negative except where noted in HPI.   OBJECTIVE    Physical Exam: Vital signs in last 24 hours: Temp:  [97.3 F (36.3 C)-97.8 F (36.6 C)] 97.6 F (36.4 C) (11/24 1223) Pulse Rate:  [61-73] 68 (11/24 1223) Resp:  [13-22] 18 (11/24 1223) BP: (102-133)/(60-93) 112/75 (11/24 1223) SpO2:  [92 %-98 %] 93 % (11/24 1223) Weight:  [74.2 kg] 74.2 kg (11/24 1223) Last BM Date: 05/02/21  General:  Alert male in NAD  Psych:  Pleasant, cooperative. Normal mood and affect Eyes: Pupils equal, no icterus. Conjunctive pink Ears: Hard of hearing  Nose: No deformity, discharge or lesions Neck:  Supple, no masses felt Lungs:  Clear to auscultation.  Heart:  Regular rate, regular rhythm, 2+ BLE edema  Abdomen:  Soft, distended.  There is abdominal wall edema .  Most of the abdomen is tympanitic, flanks and dependent portion of abdominal wall more flat to percussion . Nontender, active bowel sounds. Large firm area on lateral aspect of LUQ, similar mass like area in RLQ.  Rectal :  Deferred Msk: Symmetrical without gross deformities.  Neurologic:  Alert, oriented, grossly normal neurologically Skin:  Intact without significant lesions.    Scheduled inpatient  medications  atorvastatin  10 mg Oral QHS   latanoprost  1 drop Both Eyes QHS   pantoprazole (PROTONIX) IV  40 mg Intravenous Q12H   sodium chloride flush  3 mL Intravenous Q12H      Intake/Output from previous day: 11/23 0701 - 11/24 0700 In: 960.2 [I.V.:15.2; Blood:945] Out: -  Intake/Output this shift: Total I/O In: -  Out: 1400 [Urine:1400]   Lab Results: Recent Labs    05/01/21 2017 05/02/21 0234 05/02/21 1030  WBC 6.9 6.8 6.2  HGB 6.4* 6.1* 7.7*  HCT 18.9* 19.1* 23.1*  PLT 281 263 233   BMET Recent Labs    05/01/21 2017 05/02/21 0234  NA 122* 123*  K 3.8 3.6  CL 95* 96*  CO2 17* 20*  GLUCOSE 111* 97  BUN 26* 25*  CREATININE 3.26* 3.21*  CALCIUM 8.0* 7.9*   LFTs No results for input(s): PROT, ALBUMIN, AST, ALT, ALKPHOS, BILITOT, BILIDIR, IBILI in the last 72 hours. PT/INR No results for input(s): LABPROT, INR in the last 72 hours. Hepatitis Panel No results for input(s): HEPBSAG, HCVAB, HEPAIGM, HEPBIGM in the last 72 hours.   . CBC Latest Ref Rng & Units 05/02/2021 05/02/2021 05/01/2021  WBC 4.0 - 10.5 K/uL 6.2 6.8 6.9  Hemoglobin 13.0 - 17.0 g/dL 7.7(L) 6.1(LL) 6.4(LL)  Hematocrit 39.0 - 52.0 % 23.1(L) 19.1(L) 18.9(L)  Platelets 150 - 400 K/uL 233 263 281    . CMP Latest Ref Rng & Units 05/02/2021 05/01/2021 12/07/2020  Glucose 70 - 99 mg/dL 97 111(H) 125(H)  BUN 8 - 23 mg/dL 25(H) 26(H) 20  Creatinine 0.61 - 1.24 mg/dL 3.21(H) 3.26(H) 2.35(H)  Sodium 135 - 145 mmol/L 123(L) 122(L) 127(L)  Potassium 3.5 - 5.1 mmol/L 3.6 3.8 4.6  Chloride 98 - 111 mmol/L 96(L) 95(L) 93(L)  CO2 22 - 32 mmol/L 20(L) 17(L) 29  Calcium 8.9 - 10.3 mg/dL 7.9(L) 8.0(L) 8.8  Total Protein 6.5 - 8.1 g/dL - - -  Total Bilirubin 0.3 - 1.2 mg/dL - - -  Alkaline Phos 38 - 126 U/L - - -  AST 15 - 41 U/L - - -  ALT 0 - 44 U/L - - -     Principal Problem:   GI bleed Active Problems:   Essential hypertension   CKD (chronic kidney disease), stage IV (HCC)   Anemia  due to stage 4 chronic kidney disease (HCC)   Hyponatremia   Acute on chronic heart failure with preserved ejection fraction (HFpEF) ([place])    Tye Savoy, NP-C @  05/02/2021, 1:57 PM

## 2021-05-02 NOTE — Progress Notes (Addendum)
Patient seen and examined, admitted earlier this morning by Dr.Chotiner -Briefly Rick [name] is a 89/M with history of CKD 4, valvular heart disease, moderate to severe MR, LVH, pulmonary hypertension, essential hypertension, dyslipidemia, prostate cancer was sent to the ED by his nephrologist due to weakness, worsening hyponatremia in the setting of CKD. -While in the ER labs noted hemoglobin of 6.4 with heme positive stools, sodium was 122, creatinine was 3.2 slightly higher from baseline of 2.3-3.0, patient reported dark/black stools off and on the last few weeks.  Acute blood loss anemia Heme positive stools/melena -Hemoglobin 6.4 on admission, baseline was 12.6 in July'22 -Transfused 2 units of PRBC early this morning, repeat CBC -Gastroenterology consulting, continue PPI -Anemia panel with component of iron deficiency and chronic disease  AKI on CKD 4 Hyponatremia Volume overload -Creatinine 3.2 on admission up from baseline of 2.3-3.0 -Likely secondary to blood loss as above, transfused 2 units of PRBC overnight -Hold Cozaar -Monitor urine output -Lasix 80 mg x 1 now, repeat sodium, BMP this evening -Case discussed with nephrology, I do not think he would be a good dialysis candidate  Valvular heart disease,  moderate to severe MR, LVH Pulmonary hypertension -Volume overloaded at this time, diuretics as above  Hypertension -Hold amlodipine, Cozaar and hydralazine  DVT prophylaxis: SCDs CODE STATUS: Full code, discussed this in detail with patient and spouse, recommended consideration of DNR Disposition: Home pending GI work-up, clinical improvement  Domenic Polite, MD

## 2021-05-02 NOTE — H&P (Signed)
History and Physical    Rick [name] HYW:[id number] DOB: July 12, 1928 DOA: 05/01/2021  PCP: Tonia Ghent, MD   Patient coming from: Home  Chief Complaint: fatigue, abnormal labs  HPI: Rick [name] is a 87 y.o. male with medical history significant for HTN, HLD, CKD 4, prostate cancer who presents for evaluation of abnoraml lab work. He reports having low energy level for past few weeks. He states he has had black tarry stools for the past 3-4 weeks. He had labs by nephrology today and was found to have anemia and hyponatremia was low. He has not had LOC. He denies any bright red blood in stools. He has not had nausea or vomiting. Is not on NSAIDs or anticoagulants. He had polyp removed in 2014 during colonoscopy.   ED Course:  Hgb is 6.4 on labs in Er. PRBC ordered. Has hemoccult positive stools.  Was 122 potassium 3.8 chloride 95 bicarb 17 glucose 111 creatinine 3.26 (baseline creatinine is 2.35-3.05), WBC 6900 hemoglobin 6.4 hematocrit 18.9 platelet 291,000.  COVID is negative.  Influenza A and B are negative.  BNP ordered and pending  Review of Systems:  General: Denies fever, chills, weight loss, night sweats.  Denies dizziness.  Denies change in appetite HENT: Denies head trauma, headache, denies change in hearing, tinnitus.  Denies nasal congestion or bleeding.  Denies sore throat, sores in mouth.  Denies difficulty swallowing Eyes: Denies blurry vision, pain in eye, drainage.  Denies discoloration of eyes. Neck: Denies pain.  Denies swelling.  Denies pain with movement. Cardiovascular: Denies chest pain, palpitations.  Denies edema.  Denies orthopnea Respiratory: Reports shortness of breath with exertion, cough.  Denies wheezing.  Denies sputum production Gastrointestinal: Denies abdominal pain, swelling.  Denies nausea, vomiting, diarrhea. Denies hematemesis. Musculoskeletal: Denies limitation of movement.  Denies deformity or swelling. Denies arthralgias or  myalgias. Genitourinary: Denies pelvic pain.  Denies urinary frequency or hesitancy.  Denies dysuria.  Skin: Denies rash.  Denies petechiae, purpura, ecchymosis. Neurological: Denies syncope.  Denies seizure activity. Denies paresthesia. Denies slurred speech, drooping face.  Denies visual change. Psychiatric: Denies depression, anxiety. Denies hallucinations.  Past Medical History:  Diagnosis Date   Arthritis    GERD (gastroesophageal reflux disease)    History of colon polyps    History of prostate cancer    Hyperlipidemia    Hypertension    Kidney cysts    bilateral.  Renal US   Mitral regurgitation 12/02/2020    Past Surgical History:  Procedure Laterality Date   COLONOSCOPY     CYSTOSCOPY  02/12/99   biopsy   ESOPHAGEAL BANDING N/A 08/04/2012   Procedure: ESOPHAGEAL BANDING;  Surgeon: Inda Castle, MD;  Location: [facility];  Service: Endoscopy;  Laterality: N/A;   ESOPHAGOGASTRODUODENOSCOPY N/A 08/04/2012   Procedure: ESOPHAGOGASTRODUODENOSCOPY (EGD);  Surgeon: Inda Castle, MD;  Location: [facility];  Service: Endoscopy;  Laterality: N/A;   INGUINAL HERNIA REPAIR  02/1999   Dr. Reece Agar   PROSTATECTOMY  02/1999    Social History  reports that he quit smoking about 19 years ago. His smoking use included cigarettes. His smokeless tobacco use includes chew. He reports current alcohol use. He reports that he does not use drugs.  Allergies  Allergen Reactions   Lisinopril     cough   Nsaids     Elevated Cr    Family History  Problem Relation Age of Onset   Bone cancer Sister    Stomach cancer Brother    Stomach cancer  Brother    Lung cancer Sister    Colon cancer Sister    Stroke Mother    Colon cancer Other        nephew     Prior to Admission medications   Medication Sig Start Date End Date Taking? Authorizing Provider  amLODipine (NORVASC) 10 MG tablet Take 10 mg by mouth daily. 03/29/21  Yes [provider]  atorvastatin (LIPITOR) 10 MG  tablet Take 1 tablet (10 mg total) by mouth at bedtime. 12/07/20  Yes Tonia Ghent, MD  hydrALAZINE (APRESOLINE) 25 MG tablet Take 25 mg by mouth 3 (three) times daily. 04/09/21  Yes [provider]  latanoprost (XALATAN) 0.005 % ophthalmic solution Place 1 drop into both eyes at bedtime.   Yes [provider]  losartan (COZAAR) 100 MG tablet Take 100 mg by mouth daily. 01/31/21  Yes [provider]  omeprazole (PRILOSEC OTC) 20 MG tablet Take 1 tablet (20 mg total) by mouth daily. 12/07/20  Yes Tonia Ghent, MD  sodium bicarbonate 650 MG tablet Take 1,250 mg by mouth See admin instructions. 2 tabs bid x 8 days 04/30/21  Yes [provider]  amLODipine (NORVASC) 5 MG tablet Take 1 tablet (5 mg total) by mouth daily. Patient not taking: Reported on 05/02/2021 12/02/20   Samuella Cota, MD  metoprolol succinate (TOPROL-XL) 50 MG 24 hr tablet Take with or immediately following a meal. Patient not taking: Reported on 05/02/2021 05/28/20   Tonia Ghent, MD    Physical Exam: Vitals:   05/01/21 1938 05/01/21 2314 05/02/21 0039  BP: 117/63 122/72 128/74  Pulse: 65 65 73  Resp: 15 (!) 22 17  Temp: 97.8 F (36.6 C)    SpO2: 98% 96% 97%    Constitutional: NAD, calm, comfortable Vitals:   05/01/21 1938 05/01/21 2314 05/02/21 0039  BP: 117/63 122/72 128/74  Pulse: 65 65 73  Resp: 15 (!) 22 17  Temp: 97.8 F (36.6 C)    SpO2: 98% 96% 97%   General: WDWN, Alert and oriented x3.  Eyes: EOMI, PERRL, conjunctivae pale.  Sclera nonicteric HENT:  [place]/AT, external ears normal.  Nares patent without epistasis.  Mucous membranes are moist.  Neck: Soft, normal range of motion, supple, no masses, Trachea midline Respiratory: clear to auscultation bilaterally, no wheezing, no crackles. Normal respiratory effort. No accessory muscle use.  Cardiovascular: Regular rate and rhythm, no murmurs / rubs / gallops. Has extremity edema. 2+ pedal pulses. Abdomen: Soft,  no tenderness, nondistended, no rebound or guarding.  No masses palpated. Bowel sounds normoactive Musculoskeletal: FROM. no cyanosis. No joint deformity upper and lower extremities. Normal muscle tone.  Skin: Warm, dry, intact no rashes, lesions, ulcers. No induration.  Neurologic:  CN 2-12 grossly intact. Sensation normal to light touch. Normal speech. Strength 5/5 in all extremities.   Psychiatric:Normal mood and affect.    Labs on Admission: I have personally reviewed following labs and imaging studies  CBC: Recent Labs  Lab 05/01/21 2017  WBC 6.9  NEUTROABS 5.3  HGB 6.4*  HCT 18.9*  MCV 93.6  PLT 244    Basic Metabolic Panel: Recent Labs  Lab 05/01/21 2017  NA 122*  K 3.8  CL 95*  CO2 17*  GLUCOSE 111*  BUN 26*  CREATININE 3.26*  CALCIUM 8.0*    GFR: CrCl cannot be calculated (Unknown ideal weight.).  Liver Function Tests: No results for input(s): AST, ALT, ALKPHOS, BILITOT, PROT, ALBUMIN in the last 168 hours.  Urine analysis:    Component Value Date/Time   COLORURINE YELLOW 11/30/2020 1129   APPEARANCEUR CLOUDY (A) 11/30/2020 1129   LABSPEC 1.010 11/30/2020 1129   PHURINE 7.0 11/30/2020 1129   GLUCOSEU NEGATIVE 11/30/2020 1129   HGBUR MODERATE (A) 11/30/2020 1129   BILIRUBINUR NEGATIVE 11/30/2020 1129   KETONESUR NEGATIVE 11/30/2020 1129   PROTEINUR 100 (A) 11/30/2020 1129   UROBILINOGEN 0.2 01/11/2015 2204   NITRITE NEGATIVE 11/30/2020 1129   LEUKOCYTESUR LARGE (A) 11/30/2020 1129    Radiological Exams on Admission: No results found.  Assessment/Plan Principal Problem:   GI bleed Mr. Spickler is admitted to Cardiac telemetry floor.  Transfuse PRBC and monitor Hgb/Hct level.  GI consulted for evaluation in am.  Protonix IV bid.  Check CBC in am.   Active Problems:   Anemia due to stage 4 chronic kidney disease  Transfusion provided. Iron level ordered in Er.     Acute on chronic heart failure with preserved ejection fraction (HFpEF)   Diuresis with lasix bid. Monitor daily weights and I&Os.  Continue metoprolol, norvasc, lipitor.  Had echo in June of this year which showed EF of 60%    CKD (chronic kidney disease), stage IV Chronic. Follows with nephrology    Hyponatremia Check urine sodium and serum osmolarity Recheck electrolytes in am    Essential hypertension Continue norvasc and metoprolol. Monitor BP  DVT prophylaxis: SCDs for DVT prophylaxis.   Code Status:   Full Code  Family Communication:  Diagnosis and plan discussed with  Disposition Plan:   Patient is from:  Home  Anticipated DC to:  Home  Anticipated DC date:  Anticipate 2 midnight stay or longer.   Anticipated DC barriers:   Admission status:  Inpatient  Yevonne Aline Jazzman Loughmiller MD [facility]  How to contact the [facility] Attending or Consulting provider [place] or covering provider during after hours [place], for this patient?   Check the care team in [facility] and look for a) attending/consulting [facility] provider listed and b) the [facility] team listed Log into [email or web address] and use [place]'s universal password to access. If you do not have the password, please contact the hospital operator. Locate the [facility] provider you are looking for under [facility] and page to a number that you can be directly reached. If you still have difficulty reaching the provider, please page the [facility] (Director on Call) for the Hospitalists listed on amion for assistance.  05/02/2021, 2:23 AM

## 2021-05-02 NOTE — ED Provider Notes (Signed)
[facility] EMERGENCY DEPARTMENT Provider Note   CSN: [id number] Arrival date & time: 05/01/21  1808     History Chief Complaint  Patient presents with   Abnormal Lab    Rick [name] is a 87 y.o. male.  The history is provided by the patient and the spouse.  Abnormal Lab He has history of hypertension, hyperlipidemia, prostate cancer, polycystic kidney disease with chronic kidney disease and comes in because of abnormal blood work.  He saw his nephrologist who ordered some labs today and his sodium was low and hemoglobin was low.  He he denies chest pain, heaviness, tightness, pressure.  He does notice some decrease in exercise tolerance.  He denies any dyspnea.  He denies any abdominal pain, nausea, vomiting.  He has noted stools have been darker than normal.  He is not on any anticoagulants or NSAIDs.   Past Medical History:  Diagnosis Date   Arthritis    GERD (gastroesophageal reflux disease)    History of colon polyps    History of prostate cancer    Hyperlipidemia    Hypertension    Kidney cysts    bilateral.  Renal US   Mitral regurgitation 12/02/2020    Patient Active Problem List   Diagnosis Date Noted   Cystic lesion of abdominal viscera 12/11/2020   Mitral regurgitation 12/02/2020   Demand ischemia ([place]) 12/02/2020   Aortic atherosclerosis ([place]) 12/01/2020   Hypertensive urgency 12/01/2020   Polycystic kidney disease 12/01/2020   AKI (acute kidney injury) ([place]) superimposed on CKD stage IV 12/01/2020   Glaucoma 12/01/2020   Diarrhea 12/01/2020   Acute hypoxemic respiratory failure ([place]) 12/01/2020   CKD (chronic kidney disease), stage IV ([place]) 12/01/2020   Acute on chronic diastolic CHF (congestive heart failure) ([place]) 11/30/2020   Shoulder pain 05/30/2020   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   Retinal hemorrhage, right eye 05/29/2020   Hypertensive retinopathy, right eye 05/29/2020   Creatinine elevation 11/17/2018   Cough  10/25/2017   Health care maintenance 10/22/2016   Anemia, iron deficiency 04/11/2015   Advance care planning 01/16/2015   Benign paroxysmal positional vertigo 01/16/2015   History of prostate cancer 02/02/2014   Bilateral renal cysts 01/31/2014   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   Family history of malignant neoplasm of gastrointestinal tract 03/04/2012   Medicare annual wellness visit, subsequent 01/23/2012   VENTRAL HERNIA, ASYMPTOMATIC 03/09/2008   TOBACCO ABUSE, HX OF 54/00/8676   HELICOBACTER PYLORI INFECTION 11/25/2006   HYPERCHOLESTEROLEMIA 11/25/2006   Essential hypertension 11/25/2006   GERD 11/25/2006   Hyperglycemia 11/25/2006    Past Surgical History:  Procedure Laterality Date   COLONOSCOPY     CYSTOSCOPY  02/12/99   biopsy   ESOPHAGEAL BANDING N/A 08/04/2012   Procedure: ESOPHAGEAL BANDING;  Surgeon: Inda Castle, MD;  Location: [facility];  Service: Endoscopy;  Laterality: N/A;   ESOPHAGOGASTRODUODENOSCOPY N/A 08/04/2012   Procedure: ESOPHAGOGASTRODUODENOSCOPY (EGD);  Surgeon: Inda Castle, MD;  Location: [facility];  Service: Endoscopy;  Laterality: N/A;   INGUINAL HERNIA REPAIR  02/1999   Dr. Reece Agar   PROSTATECTOMY  02/1999       Family History  Problem Relation Age of Onset   Bone cancer Sister    Stomach cancer Brother    Stomach cancer Brother    Lung cancer Sister    Colon cancer Sister    Stroke Mother    Colon cancer Other        nephew  Social History   Tobacco Use   Smoking status: Former    Types: Cigarettes    Quit date: 08/26/2001    Years since quitting: 19.6   Smokeless tobacco: Current    Types: Chew   Tobacco comments:    quit about 10 years or more  Vaping Use   Vaping Use: Never used  Substance Use Topics   Alcohol use: Yes   Drug use: No    Home Medications Prior to Admission medications   Medication Sig Start Date End Date Taking? Authorizing Provider  amLODipine (NORVASC) 5 MG tablet Take 1 tablet (5  mg total) by mouth daily. 12/02/20   Samuella Cota, MD  atorvastatin (LIPITOR) 10 MG tablet Take 1 tablet (10 mg total) by mouth at bedtime. 12/07/20   Tonia Ghent, MD  latanoprost (XALATAN) 0.005 % ophthalmic solution Place 1 drop into both eyes at bedtime.    [provider]  metoprolol succinate (TOPROL-XL) 50 MG 24 hr tablet Take with or immediately following a meal. 05/28/20   Tonia Ghent, MD  omeprazole (PRILOSEC OTC) 20 MG tablet Take 1 tablet (20 mg total) by mouth daily. 12/07/20   Tonia Ghent, MD  psyllium (REGULOID) 0.52 g capsule Take 0.52 g by mouth daily.    [provider]    Allergies    Lisinopril and Nsaids  Review of Systems   Review of Systems  All other systems reviewed and are negative.  Physical Exam Updated Vital Signs BP 128/74   Pulse 73   Temp 97.8 F (36.6 C)   Resp 17   SpO2 97%   Physical Exam Vitals and nursing note reviewed.  87 year old male, resting comfortably and in no acute distress. Vital signs are normal. Oxygen saturation is 97%, which is normal. Head is normocephalic and atraumatic. PERRLA, EOMI. Oropharynx is clear.  Conjunctivae are slightly pale. Neck is nontender and supple without adenopathy or JVD. Back is nontender and there is no CVA tenderness.  There is 2-3+ presacral edema. Lungs are clear without rales, wheezes, or rhonchi. Chest is nontender. Heart has regular rate and rhythm without murmur. Abdomen is soft, moderately distended, nontender without masses or hepatosplenomegaly and peristalsis is normoactive. Rectal: Normal sphincter tone.  Dark brown to black stool present which is Hemoccult positive. Extremities have 3+ edema, full range of motion is present. Skin is warm and dry without rash. Neurologic: Mental status is normal, cranial nerves are intact, moves all extremities equally.  ED Results / Procedures / Treatments   Labs (all labs ordered are listed, but only abnormal results are  displayed) Labs Reviewed  CBC WITH DIFFERENTIAL/PLATELET - Abnormal; Notable for the following components:      Result Value   RBC 2.02 (*)    Hemoglobin 6.4 (*)    HCT 18.9 (*)    RDW 16.1 (*)    Lymphs Abs 0.6 (*)    All other components within normal limits  BASIC METABOLIC PANEL - Abnormal; Notable for the following components:   Sodium 122 (*)    Chloride 95 (*)    CO2 17 (*)    Glucose, Bld 111 (*)    BUN 26 (*)    Creatinine, Ser 3.26 (*)    Calcium 8.0 (*)    GFR, Estimated 17 (*)    All other components within normal limits  OSMOLALITY - Abnormal; Notable for the following components:   Osmolality 265 (*)    All other components  within normal limits  POC OCCULT BLOOD, ED - Abnormal; Notable for the following components:   Fecal Occult Bld POSITIVE (*)    All other components within normal limits  RESP PANEL BY RT-PCR (FLU A&B, COVID) ARPGX2  SODIUM, URINE, RANDOM  OSMOLALITY, URINE  CREATININE, URINE, RANDOM  VITAMIN B12  FOLATE  IRON AND TIBC  FERRITIN  RETICULOCYTES  TYPE AND SCREEN  ABO/RH  PREPARE RBC (CROSSMATCH)    EKG None  Radiology No results found.  Procedures Procedures  CRITICAL CARE Performed by: Delora Fuel Total critical care time: 45 minutes Critical care time was exclusive of separately billable procedures and treating other patients. Critical care was necessary to treat or prevent imminent or life-threatening deterioration. Critical care was time spent personally by me on the following activities: development of treatment plan with patient and/or surrogate as well as nursing, discussions with consultants, evaluation of patient's response to treatment, examination of patient, obtaining history from patient or surrogate, ordering and performing treatments and interventions, ordering and review of laboratory studies, ordering and review of radiographic studies, pulse oximetry and re-evaluation of patient's condition.  Medications Ordered  in ED Medications  0.9 %  sodium chloride infusion (has no administration in time range)    ED Course  I have reviewed the triage vital signs and the nursing notes.  Pertinent labs & imaging results that were available during my care of the patient were reviewed by me and considered in my medical decision making (see chart for details).    MDM Rules/Calculators/A&P                         Anemia with GI bleeding, probably upper GI.  Old records were reviewed, and he did have a bleeding gastric polyp in 2014.  Labs today show hemoglobin is 6.4 compared with 12.6 on 12/07/2020.  Sodium is low at 122, but had been 127 on 12/07/2020.  Creatinine is 3.26 which is within the range that he has been at.  CO2 is 17 which is a drop compared with prior.  Note from nephrologist also states that he had been given a trial of fluid restriction with without improvement in sodium.  I suspect he will need diuresis.  He blood is ordered for transfusion.  Case is discussed with Dr. Tonie Griffith of [facility], who agrees to admit the patient.  Final Clinical Impression(s) / ED Diagnoses Final diagnoses:  Anemia due to acute blood loss  Guaiac positive stools  Hyponatremia  Renal insufficiency    Rx / DC Orders ED Discharge Orders     None        Delora Fuel, MD 18/29/93 [id number]

## 2021-05-03 DIAGNOSIS — K922 Gastrointestinal hemorrhage, unspecified: Secondary | ICD-10-CM | POA: Diagnosis not present

## 2021-05-03 DIAGNOSIS — D631 Anemia in chronic kidney disease: Secondary | ICD-10-CM

## 2021-05-03 DIAGNOSIS — N184 Chronic kidney disease, stage 4 (severe): Secondary | ICD-10-CM

## 2021-05-03 LAB — TYPE AND SCREEN
ABO/RH(D): B POS
Antibody Screen: NEGATIVE
Unit division: 0
Unit division: 0

## 2021-05-03 LAB — CBC
HCT: 21.3 % — ABNORMAL LOW (ref 39.0–52.0)
Hemoglobin: 7.2 g/dL — ABNORMAL LOW (ref 13.0–17.0)
MCH: 30.1 pg (ref 26.0–34.0)
MCHC: 33.8 g/dL (ref 30.0–36.0)
MCV: 89.1 fL (ref 80.0–100.0)
Platelets: 241 10*3/uL (ref 150–400)
RBC: 2.39 MIL/uL — ABNORMAL LOW (ref 4.22–5.81)
RDW: 17.1 % — ABNORMAL HIGH (ref 11.5–15.5)
WBC: 6 10*3/uL (ref 4.0–10.5)
nRBC: 0 % (ref 0.0–0.2)

## 2021-05-03 LAB — BPAM RBC
Blood Product Expiration Date: 202212072359
Blood Product Expiration Date: 202212082359
ISSUE DATE / TIME: 202211240245
ISSUE DATE / TIME: 202211240512
Unit Type and Rh: 7300
Unit Type and Rh: 7300

## 2021-05-03 LAB — BASIC METABOLIC PANEL
Anion gap: 7 (ref 5–15)
BUN: 27 mg/dL — ABNORMAL HIGH (ref 8–23)
CO2: 22 mmol/L (ref 22–32)
Calcium: 7.6 mg/dL — ABNORMAL LOW (ref 8.9–10.3)
Chloride: 97 mmol/L — ABNORMAL LOW (ref 98–111)
Creatinine, Ser: 3.23 mg/dL — ABNORMAL HIGH (ref 0.61–1.24)
GFR, Estimated: 18 mL/min — ABNORMAL LOW (ref >60)
Glucose, Bld: 87 mg/dL (ref 70–99)
Potassium: 3.1 mmol/L — ABNORMAL LOW (ref 3.5–5.1)
Sodium: 126 mmol/L — ABNORMAL LOW (ref 135–145)

## 2021-05-03 MED ORDER — POTASSIUM CHLORIDE CRYS ER 20 MEQ PO TBCR
40.0000 meq | EXTENDED_RELEASE_TABLET | Freq: Every day | ORAL | Status: AC
  Administered 2021-05-03: 40 meq via ORAL
  Filled 2021-05-03: qty 2

## 2021-05-03 MED ORDER — POTASSIUM CHLORIDE CRYS ER 20 MEQ PO TBCR
40.0000 meq | EXTENDED_RELEASE_TABLET | Freq: Once | ORAL | Status: AC
  Administered 2021-05-03: 40 meq via ORAL
  Filled 2021-05-03: qty 2

## 2021-05-03 MED ORDER — FUROSEMIDE 10 MG/ML IJ SOLN
80.0000 mg | Freq: Every day | INTRAMUSCULAR | Status: AC
  Administered 2021-05-03: 80 mg via INTRAVENOUS
  Filled 2021-05-03: qty 8

## 2021-05-03 NOTE — Progress Notes (Signed)
PROGRESS NOTE    Rick [name]  JTT:[id number] DOB: 02-06-1929 DOA: 05/01/2021 PCP: Tonia Ghent, MD  Brief Narrative:89/M with history of CKD 4, valvular heart disease, moderate to severe MR, LVH, pulmonary hypertension, essential hypertension, dyslipidemia, prostate cancer was sent to the ED by his nephrologist due to weakness, worsening hyponatremia in the setting of CKD. -While in the ER labs noted hemoglobin of 6.4 with heme positive stools, sodium was 122, creatinine was 3.2 slightly higher from baseline of 2.3-3.0, patient reported dark/black stools off and on the last few weeks   Assessment & Plan:   Acute blood loss anemia Heme positive stools/melena -Hemoglobin 6.4 on admission, baseline was 12.6 in [place] -Transfused 2 units of PRBC yesterday a.m., hemoglobin 7.2 this morning -Gastroenterology consulting, continue PPI -Anemia panel with component of iron deficiency and chronic disease -Plan for EGD in a few days -Will give IV iron   AKI on CKD 4 Hyponatremia Volume overload -Creatinine 3.2 on admission up from baseline of 2.3-3.0 -Likely secondary to blood loss as above, transfused 2 units of PRBC overnight -Hold Cozaar -Diuresed with IV Lasix X1 yesterday, good urine output of 3.4 L -Will repeat another dose of Lasix, sodium is improving -Case discussed with nephrology yesterday, I do not think he would be a good dialysis candidate   Valvular heart disease,  moderate to severe MR, LVH Pulmonary hypertension -Volume overloaded at this time, diuretics as above   Hypertension -Hold amlodipine, Cozaar and hydralazine  Hypokalemia -Replaced   DVT prophylaxis: SCDs CODE STATUS: Full code, discussed this in detail with patient and spouse, recommended consideration of DNR Disposition: Home pending GI work-up, clinical improvement Status is: Inpatient  Remains inpatient appropriate because: Severity of illness  Consultants:  Gastroenterology  Procedures:    Antimicrobials:    Subjective: -Feels better, breathing is improving  Objective: Vitals:   05/02/21 1553 05/03/21 0231 05/03/21 0450 05/03/21 0802  BP: 107/72  122/71 (!) 130/93  Pulse: 72  64 60  Resp: 16  18 20   Temp: 97.7 F (36.5 C)  98.7 F (37.1 C)   TempSrc: Oral  Oral   SpO2: 93%  93% 100%  Weight:  75.8 kg    Height:        Intake/Output Summary (Last 24 hours) at 05/03/2021 1105 Last data filed at 05/03/2021 0905 Gross per 24 hour  Intake 720 ml  Output 3300 ml  Net -2580 ml   Filed Weights   05/02/21 1223 05/03/21 0231  Weight: 74.2 kg 75.8 kg    Examination:  General exam: Pleasant elderly male laying in bed, AAO x2, mild cognitive deficits, no distress CVS: S1-S2, regular rate rhythm Lungs: Few basilar rales Abdomen: Soft, nontender, bowel sounds present Extremities: 1+ edema Skin: No rashes on exposed skin Psych: Appropriate mood and affect   Data Reviewed:   CBC: Recent Labs  Lab 05/01/21 2017 05/02/21 0234 05/02/21 1030 05/03/21 0310  WBC 6.9 6.8 6.2 6.0  NEUTROABS 5.3  --   --   --   HGB 6.4* 6.1* 7.7* 7.2*  HCT 18.9* 19.1* 23.1* 21.3*  MCV 93.6 95.0 90.9 89.1  PLT 281 263 233 009   Basic Metabolic Panel: Recent Labs  Lab 05/01/21 2017 05/02/21 0234 05/02/21 1453 05/03/21 0310  NA 122* 123* 126* 126*  K 3.8 3.6 3.5 3.1*  CL 95* 96* 95* 97*  CO2 17* 20* 20* 22  GLUCOSE 111* 97 110* 87  BUN 26* 25* 27* 27*  CREATININE 3.26* 3.21*  3.10* 3.23*  CALCIUM 8.0* 7.9* 8.0* 7.6*   GFR: Estimated Creatinine Clearance: 14.7 mL/min (A) (by C-G formula based on SCr of 3.23 mg/dL (H)). Liver Function Tests: No results for input(s): AST, ALT, ALKPHOS, BILITOT, PROT, ALBUMIN in the last 168 hours. No results for input(s): LIPASE, AMYLASE in the last 168 hours. No results for input(s): AMMONIA in the last 168 hours. Coagulation Profile: No results for input(s): INR, PROTIME in the last 168 hours. Cardiac Enzymes: No results for  input(s): CKTOTAL, CKMB, CKMBINDEX, TROPONINI in the last 168 hours. BNP (last 3 results) No results for input(s): PROBNP in the last 8760 hours. HbA1C: No results for input(s): HGBA1C in the last 72 hours. CBG: No results for input(s): GLUCAP in the last 168 hours. Lipid Profile: No results for input(s): CHOL, HDL, LDLCALC, TRIG, CHOLHDL, LDLDIRECT in the last 72 hours. Thyroid Function Tests: No results for input(s): TSH, T4TOTAL, FREET4, T3FREE, THYROIDAB in the last 72 hours. Anemia Panel: Recent Labs    05/02/21 0234  VITAMINB12 771  FOLATE 10.4  FERRITIN 87  TIBC 231*  IRON 35*  RETICCTPCT 3.8*   Urine analysis:    Component Value Date/Time   COLORURINE YELLOW 11/30/2020 1129   APPEARANCEUR CLOUDY (A) 11/30/2020 1129   LABSPEC 1.010 11/30/2020 1129   PHURINE 7.0 11/30/2020 1129   GLUCOSEU NEGATIVE 11/30/2020 1129   HGBUR MODERATE (A) 11/30/2020 1129   BILIRUBINUR NEGATIVE 11/30/2020 1129   KETONESUR NEGATIVE 11/30/2020 1129   PROTEINUR 100 (A) 11/30/2020 1129   UROBILINOGEN 0.2 01/11/2015 2204   NITRITE NEGATIVE 11/30/2020 1129   LEUKOCYTESUR LARGE (A) 11/30/2020 1129   Sepsis Labs: @LABRCNTIP (procalcitonin:4,lacticidven:4)  ) Recent Results (from the past 240 hour(s))  Resp Panel by RT-PCR (Flu A&B, Covid) Nasopharyngeal Swab     Status: None   Collection Time: 05/01/21  9:37 PM   Specimen: Nasopharyngeal Swab; Nasopharyngeal(NP) swabs in vial transport medium  Result Value Ref Range Status   SARS Coronavirus 2 by RT PCR NEGATIVE NEGATIVE Final    Comment: (NOTE) SARS-CoV-2 target nucleic acids are NOT DETECTED.  The SARS-CoV-2 RNA is generally detectable in upper respiratory specimens during the acute phase of infection. The lowest concentration of SARS-CoV-2 viral copies this assay can detect is 138 copies/mL. A negative result does not preclude SARS-Cov-2 infection and should not be used as the sole basis for treatment or other patient management  decisions. A negative result may occur with  improper specimen collection/handling, submission of specimen other than nasopharyngeal swab, presence of viral mutation(s) within the areas targeted by this assay, and inadequate number of viral copies(<138 copies/mL). A negative result must be combined with clinical observations, patient history, and epidemiological information. The expected result is Negative.  Fact Sheet for Patients:  EntrepreneurPulse.com.au  Fact Sheet for Healthcare Providers:  IncredibleEmployment.be  This test is no t yet approved or cleared by the [place] FDA and  has been authorized for detection and/or diagnosis of SARS-CoV-2 by FDA under an Emergency Use Authorization (EUA). This EUA will remain  in effect (meaning this test can be used) for the duration of the COVID-19 declaration under Section 564(b)(1) of the Act, 21 U.S.C.section 360bbb-3(b)(1), unless the authorization is terminated  or revoked sooner.       Influenza A by PCR NEGATIVE NEGATIVE Final   Influenza B by PCR NEGATIVE NEGATIVE Final    Comment: (NOTE) The Xpert Xpress SARS-CoV-2/FLU/RSV plus assay is intended as an aid in the diagnosis of influenza from Nasopharyngeal swab specimens  and should not be used as a sole basis for treatment. Nasal washings and aspirates are unacceptable for Xpert Xpress SARS-CoV-2/FLU/RSV testing.  Fact Sheet for Patients: EntrepreneurPulse.com.au  Fact Sheet for Healthcare Providers: IncredibleEmployment.be  This test is not yet approved or cleared by the [place] FDA and has been authorized for detection and/or diagnosis of SARS-CoV-2 by FDA under an Emergency Use Authorization (EUA). This EUA will remain in effect (meaning this test can be used) for the duration of the COVID-19 declaration under Section 564(b)(1) of the Act, 21 U.S.C. section 360bbb-3(b)(1), unless the  authorization is terminated or revoked.  Performed at [facility], [place] [address] [place],  [address]          Radiology Studies: US Abdomen Complete  Result Date: 05/03/2021 CLINICAL DATA:  Abdominal distension. EXAM: ABDOMEN ULTRASOUND COMPLETE COMPARISON:  11/30/2020. FINDINGS: Gallbladder: There is a suspected stone in the gallbladder. No wall thickening or pericholecystic edema. The sonographer reports a negative Murphy sign. Common bile duct: Diameter: 2.3 mm Liver: No focal lesion identified. Within normal limits in parenchymal echogenicity. Portal vein is patent on color Doppler imaging with normal direction of blood flow towards the liver. IVC: Not well seen on exam Pancreas: Not well seen on exam Spleen: Size and appearance within normal limits. Right Kidney: Renal parenchyma is not visualized on exam. There is a cystic structure in the right renal fossa measuring 2.4 x 1.8 cm. Left Kidney: Renal parenchyma is not visualized on exam. There is a cystic structure in the left renal fossa measuring 2.5 x 1.4 cm. Calcification is noted in the region of the left renal pelvis measuring approximately 5 cm. Abdominal aorta: No aneurysm visualized. Other findings: No free fluid. IMPRESSION: 1. No evidence of renal parenchyma bilaterally with large cystic structures in the renal fossae, unchanged from prior exams. Calcification is noted in the region of the left renal pelvis. 2. Cholelithiasis without acute cholecystitis. Electronically Signed   By: Brett Fairy M.D.   On: 05/03/2021 02:36        Scheduled Meds:  atorvastatin  10 mg Oral QHS   latanoprost  1 drop Both Eyes QHS   pantoprazole (PROTONIX) IV  40 mg Intravenous Q12H   sodium chloride flush  3 mL Intravenous Q12H   Continuous Infusions:  sodium chloride       LOS: 1 day    Time spent: 67min    Domenic Polite, MD [facility]   05/03/2021, 11:05 AM

## 2021-05-03 NOTE — H&P (View-Only) (Signed)
Progress Note Hospital Day: 3  Chief Complaint:    dark stool     ASSESSMENT AND PLAN   # 87 year old male with multiple medical problems admitted with hyponatremia, AKI on CKD, acute on chronic CHF, recurrent anemia with FOBT + dark stools at home.  Hemoglobin 6.4, down from baseline of 12.6 in [place].  He could have another bleeding gastric polyp. Other etiologies such as PUD, AVM, malignancy also considerations --Suboptimal response to 2 u PRBC yesterday morning ( 6.1 >>> 7.2 this am) but no dark stools today or yesterday ( no BMs) --TRH has ordered IV Fe  --Will put on schedule for EGD on Sunday. I discussed procedure with patient and his family. Will check on him again tomorrow and / or Sunday morning to make sure he is medically stable to proceed with procedure.  -- BID PPI   # Hyponatremia, improving 123 >> 126.     # Hypokalemia, K+ 3.1. Replacement in progress.              #  Acute on chronic heart failure with preserved EF. Diuresing with IV lasix. He had 3K urine output yesterday. No ascites on yesterday's [place]   # Polycystic kidney disease. MRI July 2022 remarkable for bilateral renal cysts. One of the largest measuring over 20 cm and occupying entire right renal parenchyma. Also multiple cystic renal complexes in left kidney.    # Cholelithiasis, MRI in July 2022 remarkable for a 4.9 cm stone in gallbladder   SUBJECTIVE   No complaints.Urinating a lot. No Bms   OBJECTIVE      Scheduled inpatient medications:   atorvastatin  10 mg Oral QHS   latanoprost  1 drop Both Eyes QHS   pantoprazole (PROTONIX) IV  40 mg Intravenous Q12H   sodium chloride flush  3 mL Intravenous Q12H   Continuous inpatient infusions:   sodium chloride     PRN inpatient medications: sodium chloride, acetaminophen **OR** acetaminophen, sodium chloride flush  Vital signs in last 24 hours: Temp:  [97.7 F (36.5 C)-98.7 F (37.1 C)] 98.3 F (36.8 C) (11/25 1148) Pulse Rate:   [60-74] 74 (11/25 1148) Resp:  [16-20] 20 (11/25 0802) BP: (107-140)/(71-93) 140/84 (11/25 1148) SpO2:  [93 %-100 %] 94 % (11/25 1148) Weight:  [75.8 kg] 75.8 kg (11/25 0231) Last BM Date: 05/02/21  Intake/Output Summary (Last 24 hours) at 05/03/2021 1500 Last data filed at 05/03/2021 1323 Gross per 24 hour  Intake 780 ml  Output 2575 ml  Net -1795 ml     Physical Exam:  General: Alert male in NAD Heart:  Regular rate and rhythm, 1-2 + BLE edema Pulmonary: Normal respiratory effort Abdomen: Soft, protuberant,  nontender. Normal bowel sounds.  Neurologic: Alert and oriented Psych: Pleasant. Cooperative.   Filed Weights   05/02/21 1223 05/03/21 0231  Weight: 74.2 kg 75.8 kg    Intake/Output from previous day: 11/24 0701 - 11/25 0700 In: 360 [P.O.:360] Out: 3400 [Urine:3400] Intake/Output this shift: Total I/O In: 600 [P.O.:600] Out: 1350 [Urine:1350]    Lab Results: Recent Labs    05/02/21 0234 05/02/21 1030 05/03/21 0310  WBC 6.8 6.2 6.0  HGB 6.1* 7.7* 7.2*  HCT 19.1* 23.1* 21.3*  PLT 263 233 241   BMET Recent Labs    05/02/21 0234 05/02/21 1453 05/03/21 0310  NA 123* 126* 126*  K 3.6 3.5 3.1*  CL 96* 95* 97*  CO2 20* 20* 22  GLUCOSE 97 110* 87  BUN  25* 27* 27*  CREATININE 3.21* 3.10* 3.23*  CALCIUM 7.9* 8.0* 7.6*   LFT No results for input(s): PROT, ALBUMIN, AST, ALT, ALKPHOS, BILITOT, BILIDIR, IBILI in the last 72 hours. PT/INR No results for input(s): LABPROT, INR in the last 72 hours. Hepatitis Panel No results for input(s): HEPBSAG, HCVAB, HEPAIGM, HEPBIGM in the last 72 hours.  US Abdomen Complete  Result Date: 05/03/2021 CLINICAL DATA:  Abdominal distension. EXAM: ABDOMEN ULTRASOUND COMPLETE COMPARISON:  11/30/2020. FINDINGS: Gallbladder: There is a suspected stone in the gallbladder. No wall thickening or pericholecystic edema. The sonographer reports a negative Murphy sign. Common bile duct: Diameter: 2.3 mm Liver: No focal lesion  identified. Within normal limits in parenchymal echogenicity. Portal vein is patent on color Doppler imaging with normal direction of blood flow towards the liver. IVC: Not well seen on exam Pancreas: Not well seen on exam Spleen: Size and appearance within normal limits. Right Kidney: Renal parenchyma is not visualized on exam. There is a cystic structure in the right renal fossa measuring 2.4 x 1.8 cm. Left Kidney: Renal parenchyma is not visualized on exam. There is a cystic structure in the left renal fossa measuring 2.5 x 1.4 cm. Calcification is noted in the region of the left renal pelvis measuring approximately 5 cm. Abdominal aorta: No aneurysm visualized. Other findings: No free fluid. IMPRESSION: 1. No evidence of renal parenchyma bilaterally with large cystic structures in the renal fossae, unchanged from prior exams. Calcification is noted in the region of the left renal pelvis. 2. Cholelithiasis without acute cholecystitis. Electronically Signed   By: Brett Fairy M.D.   On: 05/03/2021 02:36       Principal Problem:   GI bleed Active Problems:   Essential hypertension   CKD (chronic kidney disease), stage IV (HCC)   Anemia due to stage 4 chronic kidney disease (HCC)   Hyponatremia   Acute on chronic heart failure with preserved ejection fraction (HFpEF) ([place])     LOS: 1 day   Tye Savoy ,NP 05/03/2021, 3:00 PM

## 2021-05-03 NOTE — Progress Notes (Signed)
Progress Note Hospital Day: 3  Chief Complaint:    dark stool     ASSESSMENT AND PLAN   # 87 year old male with multiple medical problems admitted with hyponatremia, AKI on CKD, acute on chronic CHF, recurrent anemia with FOBT + dark stools at home.  Hemoglobin 6.4, down from baseline of 12.6 in [place].  He could have another bleeding gastric polyp. Other etiologies such as PUD, AVM, malignancy also considerations --Suboptimal response to 2 u PRBC yesterday morning ( 6.1 >>> 7.2 this am) but no dark stools today or yesterday ( no BMs) --TRH has ordered IV Fe  --Will put on schedule for EGD on Sunday. I discussed procedure with patient and his family. Will check on him again tomorrow and / or Sunday morning to make sure he is medically stable to proceed with procedure.  -- BID PPI   # Hyponatremia, improving 123 >> 126.     # Hypokalemia, K+ 3.1. Replacement in progress.              #  Acute on chronic heart failure with preserved EF. Diuresing with IV lasix. He had 3K urine output yesterday. No ascites on yesterday's [place]   # Polycystic kidney disease. MRI July 2022 remarkable for bilateral renal cysts. One of the largest measuring over 20 cm and occupying entire right renal parenchyma. Also multiple cystic renal complexes in left kidney.    # Cholelithiasis, MRI in July 2022 remarkable for a 4.9 cm stone in gallbladder   SUBJECTIVE   No complaints.Urinating a lot. No Bms   OBJECTIVE      Scheduled inpatient medications:   atorvastatin  10 mg Oral QHS   latanoprost  1 drop Both Eyes QHS   pantoprazole (PROTONIX) IV  40 mg Intravenous Q12H   sodium chloride flush  3 mL Intravenous Q12H   Continuous inpatient infusions:   sodium chloride     PRN inpatient medications: sodium chloride, acetaminophen **OR** acetaminophen, sodium chloride flush  Vital signs in last 24 hours: Temp:  [97.7 F (36.5 C)-98.7 F (37.1 C)] 98.3 F (36.8 C) (11/25 1148) Pulse Rate:   [60-74] 74 (11/25 1148) Resp:  [16-20] 20 (11/25 0802) BP: (107-140)/(71-93) 140/84 (11/25 1148) SpO2:  [93 %-100 %] 94 % (11/25 1148) Weight:  [75.8 kg] 75.8 kg (11/25 0231) Last BM Date: 05/02/21  Intake/Output Summary (Last 24 hours) at 05/03/2021 1500 Last data filed at 05/03/2021 1323 Gross per 24 hour  Intake 780 ml  Output 2575 ml  Net -1795 ml     Physical Exam:  General: Alert male in NAD Heart:  Regular rate and rhythm, 1-2 + BLE edema Pulmonary: Normal respiratory effort Abdomen: Soft, protuberant,  nontender. Normal bowel sounds.  Neurologic: Alert and oriented Psych: Pleasant. Cooperative.   Filed Weights   05/02/21 1223 05/03/21 0231  Weight: 74.2 kg 75.8 kg    Intake/Output from previous day: 11/24 0701 - 11/25 0700 In: 360 [P.O.:360] Out: 3400 [Urine:3400] Intake/Output this shift: Total I/O In: 600 [P.O.:600] Out: 1350 [Urine:1350]    Lab Results: Recent Labs    05/02/21 0234 05/02/21 1030 05/03/21 0310  WBC 6.8 6.2 6.0  HGB 6.1* 7.7* 7.2*  HCT 19.1* 23.1* 21.3*  PLT 263 233 241   BMET Recent Labs    05/02/21 0234 05/02/21 1453 05/03/21 0310  NA 123* 126* 126*  K 3.6 3.5 3.1*  CL 96* 95* 97*  CO2 20* 20* 22  GLUCOSE 97 110* 87  BUN  25* 27* 27*  CREATININE 3.21* 3.10* 3.23*  CALCIUM 7.9* 8.0* 7.6*   LFT No results for input(s): PROT, ALBUMIN, AST, ALT, ALKPHOS, BILITOT, BILIDIR, IBILI in the last 72 hours. PT/INR No results for input(s): LABPROT, INR in the last 72 hours. Hepatitis Panel No results for input(s): HEPBSAG, HCVAB, HEPAIGM, HEPBIGM in the last 72 hours.  US Abdomen Complete  Result Date: 05/03/2021 CLINICAL DATA:  Abdominal distension. EXAM: ABDOMEN ULTRASOUND COMPLETE COMPARISON:  11/30/2020. FINDINGS: Gallbladder: There is a suspected stone in the gallbladder. No wall thickening or pericholecystic edema. The sonographer reports a negative Murphy sign. Common bile duct: Diameter: 2.3 mm Liver: No focal lesion  identified. Within normal limits in parenchymal echogenicity. Portal vein is patent on color Doppler imaging with normal direction of blood flow towards the liver. IVC: Not well seen on exam Pancreas: Not well seen on exam Spleen: Size and appearance within normal limits. Right Kidney: Renal parenchyma is not visualized on exam. There is a cystic structure in the right renal fossa measuring 2.4 x 1.8 cm. Left Kidney: Renal parenchyma is not visualized on exam. There is a cystic structure in the left renal fossa measuring 2.5 x 1.4 cm. Calcification is noted in the region of the left renal pelvis measuring approximately 5 cm. Abdominal aorta: No aneurysm visualized. Other findings: No free fluid. IMPRESSION: 1. No evidence of renal parenchyma bilaterally with large cystic structures in the renal fossae, unchanged from prior exams. Calcification is noted in the region of the left renal pelvis. 2. Cholelithiasis without acute cholecystitis. Electronically Signed   By: Brett Fairy M.D.   On: 05/03/2021 02:36       Principal Problem:   GI bleed Active Problems:   Essential hypertension   CKD (chronic kidney disease), stage IV (HCC)   Anemia due to stage 4 chronic kidney disease (HCC)   Hyponatremia   Acute on chronic heart failure with preserved ejection fraction (HFpEF) ([place])     LOS: 1 day   Tye Savoy ,NP 05/03/2021, 3:00 PM

## 2021-05-04 LAB — BASIC METABOLIC PANEL
Anion gap: 7 (ref 5–15)
BUN: 25 mg/dL — ABNORMAL HIGH (ref 8–23)
CO2: 24 mmol/L (ref 22–32)
Calcium: 7.6 mg/dL — ABNORMAL LOW (ref 8.9–10.3)
Chloride: 95 mmol/L — ABNORMAL LOW (ref 98–111)
Creatinine, Ser: 3.21 mg/dL — ABNORMAL HIGH (ref 0.61–1.24)
GFR, Estimated: 18 mL/min — ABNORMAL LOW (ref >60)
Glucose, Bld: 84 mg/dL (ref 70–99)
Potassium: 3.6 mmol/L (ref 3.5–5.1)
Sodium: 126 mmol/L — ABNORMAL LOW (ref 135–145)

## 2021-05-04 LAB — CBC
HCT: 23.5 % — ABNORMAL LOW (ref 39.0–52.0)
Hemoglobin: 8.1 g/dL — ABNORMAL LOW (ref 13.0–17.0)
MCH: 30.7 pg (ref 26.0–34.0)
MCHC: 34.5 g/dL (ref 30.0–36.0)
MCV: 89 fL (ref 80.0–100.0)
Platelets: 234 10*3/uL (ref 150–400)
RBC: 2.64 MIL/uL — ABNORMAL LOW (ref 4.22–5.81)
RDW: 17.2 % — ABNORMAL HIGH (ref 11.5–15.5)
WBC: 6.8 10*3/uL (ref 4.0–10.5)
nRBC: 0 % (ref 0.0–0.2)

## 2021-05-04 MED ORDER — POTASSIUM CHLORIDE CRYS ER 20 MEQ PO TBCR
20.0000 meq | EXTENDED_RELEASE_TABLET | Freq: Once | ORAL | Status: AC
  Administered 2021-05-04: 20 meq via ORAL
  Filled 2021-05-04: qty 1

## 2021-05-04 MED ORDER — SODIUM CHLORIDE 0.9 % IV SOLN
250.0000 mg | Freq: Every day | INTRAVENOUS | Status: AC
  Administered 2021-05-04 – 2021-05-05 (×2): 250 mg via INTRAVENOUS
  Filled 2021-05-04 (×2): qty 20

## 2021-05-04 MED ORDER — FUROSEMIDE 40 MG PO TABS
80.0000 mg | ORAL_TABLET | Freq: Every day | ORAL | Status: DC
  Administered 2021-05-04 – 2021-05-05 (×2): 80 mg via ORAL
  Filled 2021-05-04 (×2): qty 2

## 2021-05-04 NOTE — Evaluation (Signed)
Physical Therapy Evaluation & Discharge Patient Details Name: Rick [name] MRN: [id number] DOB: 11/17/1928 Today's Date: 05/04/2021  History of Present Illness  Pt is a 87 y.o. male who presented 05/01/21 with weakness, worsening hyponatremia in the setting of CKD. Pt also with heme positive stools. Plan for EGD 11/27. PMH: CKD 4, valvular heart disease, moderate to severe MR, LVH, pulmonary hypertension, essential hypertension, dyslipidemia, prostate cancer   Clinical Impression  Pt presents with condition above. PTA, he was mod I using a SPC for mobility, living with his wife in a 1-level house with 3 STE with 1 handrail. Currently, pt appears and reports to be functioning at his baseline, ambulating at least 360 ft with a SPC and navigating stairs without physical assistance or LOB. He does display some deficits in balance, but is able to maintain it with reactional strategies. Encouraged pt to mobilize with mobility specialists while in hospital. All education completed and questions answered. PT will sign off.       Recommendations for follow up therapy are one component of a multi-disciplinary discharge planning process, led by the attending physician.  Recommendations may be updated based on patient status, additional functional criteria and insurance authorization.  Follow Up Recommendations No PT follow up    Assistance Recommended at Discharge Intermittent Supervision/Assistance  Functional Status Assessment Patient has not had a recent decline in their functional status  Equipment Recommendations  None recommended by PT    Recommendations for Other Services       Precautions / Restrictions Precautions Precautions: Fall Restrictions Weight Bearing Restrictions: No      Mobility  Bed Mobility Overal bed mobility: Modified Independent             General bed mobility comments: Pt performs all bed mobility aspects without assistance.    Transfers Overall  transfer level: Needs assistance Equipment used: Straight cane Transfers: Sit to/from Stand Sit to Stand: Supervision           General transfer comment: Supervision for safety, no LOB.    Ambulation/Gait Ambulation/Gait assistance: Supervision;Min guard Gait Distance (Feet): 360 Feet Assistive device: Straight cane Gait Pattern/deviations: Step-through pattern;Decreased stride length Gait velocity: reduced Gait velocity interpretation: <1.8 ft/sec, indicate of risk for recurrent falls   General Gait Details: Pt with slow, fairly steady gait, needing reactional strategies at times to maintain balance, but no LOB, min guard-supervision for safety. Pt with increased instability but still no LOB when turning head side to side.  Stairs Stairs: Yes Stairs assistance: Min guard Stair Management: One rail Right;One rail Left;Step to pattern;Forwards;With cane Number of Stairs: 4 General stair comments: Ascends with R rail and SPC in L hand and descends with L rail and SPC in R hand to simulate home set-up, no LOB, min guard for safety.  Wheelchair Mobility    Modified Rankin (Stroke Patients Only)       Balance Overall balance assessment: Mild deficits observed, not formally tested                                           Pertinent Vitals/Pain Pain Assessment: Faces Faces Pain Scale: No hurt Pain Intervention(s): Monitored during session    Home Living Family/patient expects to be discharged to:: Private residence Living Arrangements: Spouse/significant other Available Help at Discharge: Family;Available 24 hours/day Type of Home: House Home Access: Stairs to enter Entrance Stairs-Rails: Right (  ascending) Entrance Stairs-Number of Steps: 3   Home Layout: One level Home Equipment: Cane - single point;Standard Walker      Prior Function Prior Level of Function : Independent/Modified Independent;Driving             Mobility Comments: Pt uses  SPC. Denies any recent falls.       Hand Dominance        Extremity/Trunk Assessment   Upper Extremity Assessment Upper Extremity Assessment: Overall WFL for tasks assessed    Lower Extremity Assessment Lower Extremity Assessment: Overall WFL for tasks assessed    Cervical / Trunk Assessment Cervical / Trunk Assessment: Kyphotic  Communication   Communication: HOH  Cognition Arousal/Alertness: Awake/alert Behavior During Therapy: WFL for tasks assessed/performed Overall Cognitive Status: Within Functional Limits for tasks assessed                                          General Comments General comments (skin integrity, edema, etc.): Educated pt to transition to walker if instability worsens.    Exercises     Assessment/Plan    PT Assessment Patient does not need any further PT services  PT Problem List         PT Treatment Interventions      PT Goals (Current goals can be found in the Care Plan section)  Acute Rehab PT Goals Patient Stated Goal: to walk PT Goal Formulation: With patient/family Time For Goal Achievement: 05/05/21 Potential to Achieve Goals: Good    Frequency     Barriers to discharge        Co-evaluation               AM-PAC PT "6 Clicks" Mobility  Outcome Measure Help needed turning from your back to your side while in a flat bed without using bedrails?: None Help needed moving from lying on your back to sitting on the side of a flat bed without using bedrails?: None Help needed moving to and from a bed to a chair (including a wheelchair)?: A Little Help needed standing up from a chair using your arms (e.g., wheelchair or bedside chair)?: A Little Help needed to walk in hospital room?: A Little Help needed climbing 3-5 steps with a railing? : A Little 6 Click Score: 20    End of Session Equipment Utilized During Treatment: Gait belt Activity Tolerance: Patient tolerated treatment well Patient left: in  bed;with call bell/phone within reach;with bed alarm set;with family/visitor present Nurse Communication: Mobility status PT Visit Diagnosis: Unsteadiness on feet (R26.81);Other abnormalities of gait and mobility (R26.89)    Time: 5176-1607 PT Time Calculation (min) (ACUTE ONLY): 24 min   Charges:   PT Evaluation $PT Eval Low Complexity: 1 Low PT Treatments $Gait Training: 8-22 mins        Moishe Spice, PT, DPT Acute Rehabilitation Services  Pager: [phone] Office: [phone]   Orvan Falconer 05/04/2021, 3:33 PM

## 2021-05-04 NOTE — Progress Notes (Signed)
PROGRESS NOTE    Rick [name]  OEU:[id number] DOB: 1928-08-25 DOA: 05/01/2021 PCP: Tonia Ghent, MD  Brief Narrative:89/M with history of CKD 4, valvular heart disease, moderate to severe MR, LVH, pulmonary hypertension, essential hypertension, dyslipidemia, prostate cancer was sent to the ED by his nephrologist due to weakness, worsening hyponatremia in the setting of CKD. -While in the ER labs noted hemoglobin of 6.4 with heme positive stools, sodium was 122, creatinine was 3.2 slightly higher from baseline of 2.3-3.0, patient reported dark/black stools off and on the last few weeks   Assessment & Plan:   Acute blood loss anemia Heme positive stools/melena -Hemoglobin 6.4 on admission, baseline was 12.6 in July'22 -Transfused 2 units of PRBC -Gastroenterology consulting, continue PPI -Anemia panel with component of iron deficiency and chronic disease -Plan for EGD tomorrow -Give IV iron, hemoglobin seems to have stabilized, check CBC in a.m.   AKI on CKD 4 Hyponatremia Volume overload -Creatinine 3.2 on admission up from baseline of 2.3-3.0 -Likely secondary to blood loss as above, transfused 2 units of PRBC overnight -Hold Cozaar -Diuresed with IV Lasix X 2 days, he is -4.6 L, volume status is improved considerably -Transition to oral Lasix, sodium seems to have plateaued but clinically asymptomatic now -Case discussed with nephrology 11/24, I do not think he would be a good dialysis candidate   Valvular heart disease,  moderate to severe MR, LVH Pulmonary hypertension -Volume overloaded at this time, diuretics as above   Hypertension -Hold amlodipine, Cozaar and hydralazine  Hypokalemia -Replaced   DVT prophylaxis: SCDs CODE STATUS: DNR, after discussion with patient and his daughter today Family communication: Discussed with patient and daughter at bedside Disposition: Home pending GI work-up, clinical improvement Status is: Inpatient  Remains inpatient  appropriate because: Severity of illness  Consultants:  Gastroenterology  Procedures:   Antimicrobials:    Subjective: -Feels better, breathing has improved significantly  Objective: Vitals:   05/04/21 0010 05/04/21 0416 05/04/21 0818 05/04/21 1128  BP: 118/82 114/74 126/85 114/77  Pulse: 77 80 78 (!) 42  Resp: 20 20    Temp: 98.4 F (36.9 C) 98.6 F (37 C) 97.8 F (36.6 C)   TempSrc: Oral Oral Oral   SpO2: 93% 93% 97% 95%  Weight:  68.9 kg    Height:        Intake/Output Summary (Last 24 hours) at 05/04/2021 1156 Last data filed at 05/04/2021 1126 Gross per 24 hour  Intake 960 ml  Output 2925 ml  Net -1965 ml   Filed Weights   05/02/21 1223 05/03/21 0231 05/04/21 0416  Weight: 74.2 kg 75.8 kg 68.9 kg    Examination:  General exam: Pleasant elderly male sitting up in bed, AAO x2, no distress CVS: S1-S2, regular rate rhythm Lungs: Decreased breath sounds to bases, otherwise clear today Abdomen: Soft, nontender, bowel sounds present Extremities: Trace edema  Skin: No rashes on exposed skin Psych: Appropriate mood and affect   Data Reviewed:   CBC: Recent Labs  Lab 05/01/21 2017 05/02/21 0234 05/02/21 1030 05/03/21 0310 05/04/21 0314  WBC 6.9 6.8 6.2 6.0 6.8  NEUTROABS 5.3  --   --   --   --   HGB 6.4* 6.1* 7.7* 7.2* 8.1*  HCT 18.9* 19.1* 23.1* 21.3* 23.5*  MCV 93.6 95.0 90.9 89.1 89.0  PLT 281 263 233 241 154   Basic Metabolic Panel: Recent Labs  Lab 05/01/21 2017 05/02/21 0234 05/02/21 1453 05/03/21 0310 05/04/21 0314  NA 122* 123* 126*  126* 126*  K 3.8 3.6 3.5 3.1* 3.6  CL 95* 96* 95* 97* 95*  CO2 17* 20* 20* 22 24  GLUCOSE 111* 97 110* 87 84  BUN 26* 25* 27* 27* 25*  CREATININE 3.26* 3.21* 3.10* 3.23* 3.21*  CALCIUM 8.0* 7.9* 8.0* 7.6* 7.6*   GFR: Estimated Creatinine Clearance: 14.8 mL/min (A) (by C-G formula based on SCr of 3.21 mg/dL (H)). Liver Function Tests: No results for input(s): AST, ALT, ALKPHOS, BILITOT, PROT,  ALBUMIN in the last 168 hours. No results for input(s): LIPASE, AMYLASE in the last 168 hours. No results for input(s): AMMONIA in the last 168 hours. Coagulation Profile: No results for input(s): INR, PROTIME in the last 168 hours. Cardiac Enzymes: No results for input(s): CKTOTAL, CKMB, CKMBINDEX, TROPONINI in the last 168 hours. BNP (last 3 results) No results for input(s): PROBNP in the last 8760 hours. HbA1C: No results for input(s): HGBA1C in the last 72 hours. CBG: No results for input(s): GLUCAP in the last 168 hours. Lipid Profile: No results for input(s): CHOL, HDL, LDLCALC, TRIG, CHOLHDL, LDLDIRECT in the last 72 hours. Thyroid Function Tests: No results for input(s): TSH, T4TOTAL, FREET4, T3FREE, THYROIDAB in the last 72 hours. Anemia Panel: Recent Labs    05/02/21 0234  VITAMINB12 771  FOLATE 10.4  FERRITIN 87  TIBC 231*  IRON 35*  RETICCTPCT 3.8*   Urine analysis:    Component Value Date/Time   COLORURINE YELLOW 11/30/2020 1129   APPEARANCEUR CLOUDY (A) 11/30/2020 1129   LABSPEC 1.010 11/30/2020 1129   PHURINE 7.0 11/30/2020 1129   GLUCOSEU NEGATIVE 11/30/2020 1129   HGBUR MODERATE (A) 11/30/2020 1129   BILIRUBINUR NEGATIVE 11/30/2020 1129   KETONESUR NEGATIVE 11/30/2020 1129   PROTEINUR 100 (A) 11/30/2020 1129   UROBILINOGEN 0.2 01/11/2015 2204   NITRITE NEGATIVE 11/30/2020 1129   LEUKOCYTESUR LARGE (A) 11/30/2020 1129   Sepsis Labs: @LABRCNTIP (procalcitonin:4,lacticidven:4)  ) Recent Results (from the past 240 hour(s))  Resp Panel by RT-PCR (Flu A&B, Covid) Nasopharyngeal Swab     Status: None   Collection Time: 05/01/21  9:37 PM   Specimen: Nasopharyngeal Swab; Nasopharyngeal(NP) swabs in vial transport medium  Result Value Ref Range Status   SARS Coronavirus 2 by RT PCR NEGATIVE NEGATIVE Final    Comment: (NOTE) SARS-CoV-2 target nucleic acids are NOT DETECTED.  The SARS-CoV-2 RNA is generally detectable in upper respiratory specimens during  the acute phase of infection. The lowest concentration of SARS-CoV-2 viral copies this assay can detect is 138 copies/mL. A negative result does not preclude SARS-Cov-2 infection and should not be used as the sole basis for treatment or other patient management decisions. A negative result may occur with  improper specimen collection/handling, submission of specimen other than nasopharyngeal swab, presence of viral mutation(s) within the areas targeted by this assay, and inadequate number of viral copies(<138 copies/mL). A negative result must be combined with clinical observations, patient history, and epidemiological information. The expected result is Negative.  Fact Sheet for Patients:  EntrepreneurPulse.com.au  Fact Sheet for Healthcare Providers:  IncredibleEmployment.be  This test is no t yet approved or cleared by the [place] FDA and  has been authorized for detection and/or diagnosis of SARS-CoV-2 by FDA under an Emergency Use Authorization (EUA). This EUA will remain  in effect (meaning this test can be used) for the duration of the COVID-19 declaration under Section 564(b)(1) of the Act, 21 U.S.C.section 360bbb-3(b)(1), unless the authorization is terminated  or revoked sooner.  Influenza A by PCR NEGATIVE NEGATIVE Final   Influenza B by PCR NEGATIVE NEGATIVE Final    Comment: (NOTE) The Xpert Xpress SARS-CoV-2/FLU/RSV plus assay is intended as an aid in the diagnosis of influenza from Nasopharyngeal swab specimens and should not be used as a sole basis for treatment. Nasal washings and aspirates are unacceptable for Xpert Xpress SARS-CoV-2/FLU/RSV testing.  Fact Sheet for Patients: EntrepreneurPulse.com.au  Fact Sheet for Healthcare Providers: IncredibleEmployment.be  This test is not yet approved or cleared by the [place] FDA and has been authorized for detection and/or  diagnosis of SARS-CoV-2 by FDA under an Emergency Use Authorization (EUA). This EUA will remain in effect (meaning this test can be used) for the duration of the COVID-19 declaration under Section 564(b)(1) of the Act, 21 U.S.C. section 360bbb-3(b)(1), unless the authorization is terminated or revoked.  Performed at [facility], [place] [address], [place], [place] [address]      Radiology Studies: US Abdomen Complete  Result Date: 05/03/2021 CLINICAL DATA:  Abdominal distension. EXAM: ABDOMEN ULTRASOUND COMPLETE COMPARISON:  11/30/2020. FINDINGS: Gallbladder: There is a suspected stone in the gallbladder. No wall thickening or pericholecystic edema. The sonographer reports a negative Murphy sign. Common bile duct: Diameter: 2.3 mm Liver: No focal lesion identified. Within normal limits in parenchymal echogenicity. Portal vein is patent on color Doppler imaging with normal direction of blood flow towards the liver. IVC: Not well seen on exam Pancreas: Not well seen on exam Spleen: Size and appearance within normal limits. Right Kidney: Renal parenchyma is not visualized on exam. There is a cystic structure in the right renal fossa measuring 2.4 x 1.8 cm. Left Kidney: Renal parenchyma is not visualized on exam. There is a cystic structure in the left renal fossa measuring 2.5 x 1.4 cm. Calcification is noted in the region of the left renal pelvis measuring approximately 5 cm. Abdominal aorta: No aneurysm visualized. Other findings: No free fluid. IMPRESSION: 1. No evidence of renal parenchyma bilaterally with large cystic structures in the renal fossae, unchanged from prior exams. Calcification is noted in the region of the left renal pelvis. 2. Cholelithiasis without acute cholecystitis. Electronically Signed   By: Brett Fairy M.D.   On: 05/03/2021 02:36        Scheduled Meds:  atorvastatin  10 mg Oral QHS   furosemide  80 mg Oral Daily   latanoprost  1 drop Both Eyes QHS    pantoprazole (PROTONIX) IV  40 mg Intravenous Q12H   potassium chloride  20 mEq Oral Once   sodium chloride flush  3 mL Intravenous Q12H   Continuous Infusions:  sodium chloride       LOS: 2 days    Time spent: 94min  Domenic Polite, MD [facility]   05/04/2021, 11:56 AM

## 2021-05-04 NOTE — Progress Notes (Signed)
     Progress Note Hospital Day: 4   Came by to check on Rick [name] . Plan is for EGD tomorrow. Up in chair eating lunch. No complaints. Hgb improved to 8.1 post 2 u PRBC and IV iron. He has been transitioned to PO lasix, has negative fluid balance 4.6 L.  Na+ is stable at 126. Creatinine same at 3.21.   NPO after MN for EGD tomorrow

## 2021-05-04 NOTE — Progress Notes (Signed)
Mobility Specialist Progress Note:   05/04/21 1042  Mobility  Activity Ambulated in hall  Level of Assistance Contact guard assist, steadying assist  Assistive Device Cane  Distance Ambulated (ft) 520 ft  Mobility Ambulated with assistance in hallway  Mobility Response Tolerated well  Mobility performed by Mobility specialist  $Mobility charge 1 Mobility   Pt received in bed willing to participate in mobility. No complaints of pain.Pt returned to sink with nurse tech to get washed up.   [facility] Public librarian Phone [phone] Secondary Phone [phone]

## 2021-05-05 ENCOUNTER — Inpatient Hospital Stay (HOSPITAL_COMMUNITY): Payer: Medicare PPO | Admitting: Certified Registered Nurse Anesthetist

## 2021-05-05 ENCOUNTER — Encounter (HOSPITAL_COMMUNITY)
Admission: EM | Disposition: A | Discharge status: Home / Self Care | Payer: Self-pay | Source: Home / Self Care | Attending: Internal Medicine

## 2021-05-05 ENCOUNTER — Encounter (HOSPITAL_COMMUNITY): Payer: Self-pay | Admitting: Family Medicine

## 2021-05-05 DIAGNOSIS — K317 Polyp of stomach and duodenum: Secondary | ICD-10-CM | POA: Diagnosis not present

## 2021-05-05 DIAGNOSIS — K2970 Gastritis, unspecified, without bleeding: Secondary | ICD-10-CM | POA: Diagnosis not present

## 2021-05-05 DIAGNOSIS — K2990 Gastroduodenitis, unspecified, without bleeding: Secondary | ICD-10-CM

## 2021-05-05 HISTORY — PX: ESOPHAGOGASTRODUODENOSCOPY (EGD) WITH PROPOFOL: SHX5813

## 2021-05-05 HISTORY — PX: BIOPSY: SHX5522

## 2021-05-05 HISTORY — PX: POLYPECTOMY: SHX5525

## 2021-05-05 LAB — CBC
HCT: 22 % — ABNORMAL LOW (ref 39.0–52.0)
Hemoglobin: 7.2 g/dL — ABNORMAL LOW (ref 13.0–17.0)
MCH: 30.5 pg (ref 26.0–34.0)
MCHC: 32.7 g/dL (ref 30.0–36.0)
MCV: 93.2 fL (ref 80.0–100.0)
Platelets: 226 10*3/uL (ref 150–400)
RBC: 2.36 MIL/uL — ABNORMAL LOW (ref 4.22–5.81)
RDW: 16.9 % — ABNORMAL HIGH (ref 11.5–15.5)
WBC: 7.5 10*3/uL (ref 4.0–10.5)
nRBC: 0 % (ref 0.0–0.2)

## 2021-05-05 LAB — BASIC METABOLIC PANEL
Anion gap: 7 (ref 5–15)
BUN: 26 mg/dL — ABNORMAL HIGH (ref 8–23)
CO2: 23 mmol/L (ref 22–32)
Calcium: 7.6 mg/dL — ABNORMAL LOW (ref 8.9–10.3)
Chloride: 98 mmol/L (ref 98–111)
Creatinine, Ser: 3.07 mg/dL — ABNORMAL HIGH (ref 0.61–1.24)
GFR, Estimated: 19 mL/min — ABNORMAL LOW (ref >60)
Glucose, Bld: 96 mg/dL (ref 70–99)
Potassium: 3.8 mmol/L (ref 3.5–5.1)
Sodium: 128 mmol/L — ABNORMAL LOW (ref 135–145)

## 2021-05-05 LAB — PREPARE RBC (CROSSMATCH)

## 2021-05-05 SURGERY — ESOPHAGOGASTRODUODENOSCOPY (EGD) WITH PROPOFOL
Anesthesia: Monitor Anesthesia Care

## 2021-05-05 MED ORDER — PHENYLEPHRINE 40 MCG/ML (10ML) SYRINGE FOR IV PUSH (FOR BLOOD PRESSURE SUPPORT)
PREFILLED_SYRINGE | INTRAVENOUS | Status: DC | PRN
  Administered 2021-05-05 (×5): 80 ug via INTRAVENOUS

## 2021-05-05 MED ORDER — PROPOFOL 500 MG/50ML IV EMUL
INTRAVENOUS | Status: DC | PRN
  Administered 2021-05-05: 60 ug/kg/min via INTRAVENOUS

## 2021-05-05 MED ORDER — SODIUM CHLORIDE 0.9% IV SOLUTION: Freq: Once | INTRAVENOUS | Status: DC

## 2021-05-05 MED ORDER — SODIUM CHLORIDE 0.9 % IV SOLN
250.0000 mg | Freq: Every day | INTRAVENOUS | Status: AC
  Administered 2021-05-06: 09:00:00 250 mg via INTRAVENOUS
  Filled 2021-05-05 (×2): qty 20

## 2021-05-05 MED ORDER — LIDOCAINE 2% (20 MG/ML) 5 ML SYRINGE
INTRAMUSCULAR | Status: DC | PRN
  Administered 2021-05-05: 40 mg via INTRAVENOUS

## 2021-05-05 MED ORDER — SODIUM CHLORIDE 0.9% IV SOLUTION: Freq: Once | INTRAVENOUS | Status: AC

## 2021-05-05 MED ORDER — SODIUM CHLORIDE 0.9 % IV SOLN: INTRAVENOUS | Status: DC | PRN

## 2021-05-05 MED ORDER — EPHEDRINE SULFATE-NACL 50-0.9 MG/10ML-% IV SOSY
PREFILLED_SYRINGE | INTRAVENOUS | Status: DC | PRN
  Administered 2021-05-05 (×2): 5 mg via INTRAVENOUS

## 2021-05-05 MED ORDER — PROPOFOL 10 MG/ML IV BOLUS
INTRAVENOUS | Status: DC | PRN
  Administered 2021-05-05: 5 mg via INTRAVENOUS

## 2021-05-05 MED ORDER — FUROSEMIDE 40 MG PO TABS
40.0000 mg | ORAL_TABLET | Freq: Every day | ORAL | Status: DC
  Administered 2021-05-06 – 2021-05-07 (×2): 40 mg via ORAL
  Filled 2021-05-05 (×2): qty 1

## 2021-05-05 SURGICAL SUPPLY — 15 items

## 2021-05-05 NOTE — Anesthesia Preprocedure Evaluation (Addendum)
Anesthesia Evaluation  Patient identified by MRN, date of birth, ID band Patient awake    Reviewed: Allergy & Precautions, NPO status , Patient's Chart, lab work & pertinent test results  Airway Mallampati: II  TM Distance: >3 FB     Dental  (+) Missing   Pulmonary neg pulmonary ROS, former smoker,    Pulmonary exam normal        Cardiovascular hypertension, Pt. on medications and Pt. on home beta blockers +CHF  + Valvular Problems/Murmurs MR  Rhythm:Regular Rate:Normal     Neuro/Psych negative neurological ROS  negative psych ROS   GI/Hepatic Neg liver ROS, GERD  Medicated,FOBT+   Endo/Other  negative endocrine ROS  Renal/GU negative Renal ROS   Prostate Ca    Musculoskeletal  (+) Arthritis , Osteoarthritis,    Abdominal Normal abdominal exam  (+)   Peds  Hematology  (+) anemia ,   Anesthesia Other Findings   Reproductive/Obstetrics                            Anesthesia Physical Anesthesia Plan  ASA: 3  Anesthesia Plan: MAC   Post-op Pain Management:    Induction: Intravenous  PONV Risk Score and Plan: 1 and Propofol infusion and Treatment may vary due to age or medical condition  Airway Management Planned: Natural Airway, Simple Face Mask and Nasal Cannula  Additional Equipment: None  Intra-op Plan:   Post-operative Plan:   Informed Consent: I have reviewed the patients History and Physical, chart, labs and discussed the procedure including the risks, benefits and alternatives for the proposed anesthesia with the patient or authorized representative who has indicated his/her understanding and acceptance.   Patient has DNR.   Dental advisory given  Plan Discussed with: CRNA  Anesthesia Plan Comments: (Lab Results      Component                Value               Date                      WBC                      7.5                 05/05/2021                HGB                       7.2 (L)             05/05/2021                HCT                      22.0 (L)            05/05/2021                MCV                      93.2                05/05/2021                PLT  226                 05/05/2021           Lab Results      Component                Value               Date                      NA                       128 (L)             05/05/2021                K                        3.8                 05/05/2021                CO2                      23                  05/05/2021                GLUCOSE                  96                  05/05/2021                BUN                      26 (H)              05/05/2021                CREATININE               3.07 (H)            05/05/2021                CALCIUM                  7.6 (L)             05/05/2021                GFRNONAA                 19 (L)              05/05/2021           ECHO 06/22: 1. Moderate to severe mitral regurgitation is present, likely severe. The  jet is eccentric and anteriorly directed. There is likely pulmonary vein  flow reversal in systole in the right inferior pulmonary vein. There is  splay artifact present, indicating  at least moderate severity. The jet appears to wrap around the LA,  indicative of severe MR. RVSP elevated. There is no obvious flail segment,  but suspect this is not well seen. Would recommend a TEE for  characterization of the MR. The mitral valve is  grossly normal. Moderate to severe mitral valve regurgitation. No evidence  of mitral stenosis.  2. Left ventricular ejection fraction, by estimation, is 60 to 65%. The  left ventricle has normal function. The left ventricle has no regional  wall motion abnormalities. There is severe asymmetric left ventricular  hypertrophy of the basal-septal  segment. Indeterminate diastolic filling due to E-A fusion.  3. Right ventricular systolic function is normal. The right  ventricular  size is normal. There is mildly elevated pulmonary artery systolic  pressure. The estimated right ventricular systolic pressure is 00.3 mmHg.  4. The aortic valve is tricuspid. Aortic valve regurgitation is mild to  moderate. Mild aortic valve sclerosis is present, with no evidence of  aortic valve stenosis.  5. The inferior vena cava is normal in size with greater than 50%  respiratory variability, suggesting right atrial pressure of 3 mmHg.  6. There is a large cystic structure located inferior to the liver, which  could represent a cyst. Would recommend dedicated liver imaging. )       Anesthesia Quick Evaluation

## 2021-05-05 NOTE — Progress Notes (Signed)
Mobility Specialist Progress Note:   05/05/21 1106  Mobility  Activity Ambulated in hall  Level of Assistance Modified independent, requires aide device or extra time  Assistive Device [facility] Ambulated (ft) 520 ft  Mobility Ambulated with assistance in hallway  Mobility Response Tolerated well  Mobility performed by Mobility specialist  $Mobility charge 1 Mobility   Pt received in bed willing to participate in mobility. No complaints of pain and asymptomatic. Pt returned to bed with call bell in reach and all needs met.   [facility] Public librarian Phone [phone] Secondary Phone [phone]

## 2021-05-05 NOTE — Anesthesia Procedure Notes (Signed)
Procedure Name: MAC Date/Time: 05/05/2021 8:46 AM Performed by: Janene Harvey, CRNA Pre-anesthesia Checklist: Patient identified, Emergency Drugs available, Suction available and Patient being monitored Patient Re-evaluated:Patient Re-evaluated prior to induction Oxygen Delivery Method: Nasal cannula Induction Type: IV induction Placement Confirmation: positive ETCO2 Dental Injury: Teeth and Oropharynx as per pre-operative assessment

## 2021-05-05 NOTE — Op Note (Addendum)
[facility] Patient Name: Rick [name] Procedure Date : 05/05/2021 MRN: [id number] Attending MD: Thornton Park MD, MD Date of Birth: 19-Jul-1928 CSN: [id number] Age: 87 Admit Type: Inpatient Procedure:                Upper GI endoscopy Indications:              Suspected upper gastrointestinal bleeding                           Admitted with acute on chronic heart failure. Found                            to have recurrent anemia with a hemoglobin of 6.4                            with a one week history of intermittently dark                            stools. Hemoccult+. He has multiple serious other                            chronic medical problems including stage IV chronic                            kidney disease, bulky polycystic kidney disease,                            valvular heart disease, hyponatremia and                            hypertension.                           Endoscopic evaluation in 2013 for heme positive                            stool revealed a bleeding pedunculated hyperplastic                            gastric polyp treated with band ligation x 3, a                            nonbleeding cecal AVM, moderately severe sigmoid                            diverticulosis and a small transverse tubular                            adenoma.                           Seen again in 2016 for heme positive stool and                            associated anemia. Given his response to oral iron  and his advanced age, endoscopic evaluation not                            pursued at that time.                           Admitted now with hemoglobin 6.4, MCV 93, platelets                            281                           Hemoccult +                           Anemia panel shows iron deficiency and anemia of                            chronic disease                           No localizing symptoms.                            CT 11/2020 and MRI 12/2020 reviewed. He has                            polycystic kidney disease. He has a very large                            gallstone within the gallbladder but this does not                            appear to be symptomatic. Providers:                Thornton Park MD, MD, Carlyn Reichert, RN, Tyna Jaksch Technician Referring MD:              Medicines:                Monitored Anesthesia Care Complications:            Short runs of ventricular tachycardia without                            associated hemodynamic instability were noted                            during the procedure. No immediate complications.                            Estimated blood loss: Minimal. Estimated Blood Loss:     Estimated blood loss was minimal. Procedure:                Pre-Anesthesia Assessment:                           -  Prior to the procedure, a History and Physical                            was performed, and patient medications and                            allergies were reviewed. The patient's tolerance of                            previous anesthesia was also reviewed. The risks                            and benefits of the procedure and the sedation                            options and risks were discussed with the patient.                            All questions were answered, and informed consent                            was obtained. Prior Anticoagulants: The patient has                            taken no previous anticoagulant or antiplatelet                            agents. ASA Grade Assessment: III - A patient with                            severe systemic disease. After reviewing the risks                            and benefits, the patient was deemed in                            satisfactory condition to undergo the procedure.                           After obtaining informed consent, the endoscope was                             passed under direct vision. Throughout the                            procedure, the patient's blood pressure, pulse, and                            oxygen saturations were monitored continuously. The                            GIF-H190 ([id number] Olympus endoscope was introduced  through the mouth, and advanced to the third part                            of duodenum. The upper GI endoscopy was                            accomplished without difficulty. The patient                            tolerated the procedure well. Scope In: Scope Out: Findings:      The examined esophagus was normal.      A small hiatal hernia was present.      an 49mm pedunculated polyp in the gastric body, any may be contributing       to the anemia. The polyp removed with a cold snare. Resection and       retrieval were complete. Estimated blood loss was minimal. The other       polyps were sessile and had an appearance more consistent with fundic       gland polyps.      Diffuse atrophic mucosa was found in the gastric body and in the gastric       antrum. Biopsies were taken from the antrum, body, and fundus with a       cold forceps for histology. Estimated blood loss was minimal.      The examined duodenum was normal. Biopsies were taken with a cold       forceps for histology. Estimated blood loss was minimal.      The cardia and gastric fundus were normal on retroflexion.      The exam was otherwise without abnormality. No frank blood or evidence       for recent bleeding seen during the procedure. Impression:               - Normal esophagus.                           - Small hiatal hernia.                           - Multiple gastric polyps. The largest polyp may be                            contributed to his anemia. It was esected and                            retrieved.                           - Gastric mucosal atrophy. Biopsied.                           -  Normal examined duodenum. Biopsied.                           - No evidence for recent or active bleeding Recommendation:           - Return patient to hospital ward for ongoing care.                           -  Clear liquid diet. Advance diet if hemoglobin                            stabilizes.                           - Continue present medications including PPI BID.                           - Await pathology results.                           - Continue serial hgb/hct with transfusion as                            indicated.                           - GIven these results, I am hopeful that any                            component of GI blood loss anemia associated to the                            gastric polyp may improve over time after the                            polypectomy. Given his comorbidities, his advanced                            age, and the likelihood that his anemia has                            multiple etiologies, would proceed with colonoscopy                            only if his anemia does not response to iron                            replacement or if there is overt GI bleeding.                           The results and recommendations were discussed with                            the patient in PACU and with his wife by phone. All                            questions answered to their satisfaction. He was                            given a paper copy of the procedure note.  The inpatient GI team will move to stand-by.                            However, please call the on-call gastroenterologist                            with any questions or concerns during this                            hospitalization. Procedure Code(s):        --- Professional ---                           [phone], Esophagogastroduodenoscopy, flexible,                            transoral; with removal of tumor(s), polyp(s), or                             other lesion(s) by snare technique                           43239, 59,51, Esophagogastroduodenoscopy, flexible,                            transoral; with biopsy, single or multiple Diagnosis Code(s):        --- Professional ---                           K44.9, Diaphragmatic hernia without obstruction or                            gangrene                           K31.7, Polyp of stomach and duodenum                           K31.89, Other diseases of stomach and duodenum CPT copyright 2019 [employer]. All rights reserved. The codes documented in this report are preliminary and upon coder review may  be revised to meet current compliance requirements. Thornton Park MD, MD 05/05/2021 9:34:22 AM This report has been signed electronically. Number of Addenda: 0

## 2021-05-05 NOTE — Transfer of Care (Signed)
Immediate Anesthesia Transfer of Care Note  Patient: Rick [name]  Procedure(s) Performed: ESOPHAGOGASTRODUODENOSCOPY (EGD) WITH PROPOFOL BIOPSY POLYPECTOMY  Patient Location: PACU  Anesthesia Type:MAC  Level of Consciousness: drowsy and patient cooperative  Airway & Oxygen Therapy: Patient Spontanous Breathing and Patient connected to nasal cannula oxygen  Post-op Assessment: Report given to RN and Post -op Vital signs reviewed and stable  Post vital signs: Reviewed  Last Vitals:  Vitals Value Taken Time  BP 114/72 05/05/21 0927  Temp    Pulse 86 05/05/21 0930  Resp 26 05/05/21 0930  SpO2 98 % 05/05/21 0930  Vitals shown include unvalidated device data.  Last Pain:  Vitals:   05/05/21 0756  TempSrc: Temporal  PainSc: 0-No pain         Complications: No notable events documented.

## 2021-05-05 NOTE — Progress Notes (Signed)
PROGRESS NOTE    Rick [name]  QIO:[id number] DOB: 1929/05/03 DOA: 05/01/2021 PCP: Tonia Ghent, MD  Brief Narrative:89/M with history of CKD 4, valvular heart disease, moderate to severe MR, LVH, pulmonary hypertension, essential hypertension, dyslipidemia, prostate cancer was sent to the ED by his nephrologist due to weakness, worsening hyponatremia in the setting of CKD. -While in the ER labs noted hemoglobin of 6.4 with heme positive stools, sodium was 122, creatinine was 3.2 slightly higher from baseline of 2.3-3.0, patient reported dark/black stools off and on the last few weeks   Assessment & Plan:   Acute blood loss anemia Heme positive stools/melena -Hemoglobin 6.4 on admission, baseline was 12.6 in July'22 -Transfused 2 units of PRBC -Gastroenterology consulting, continue PPI -Anemia panel with component of iron deficiency and chronic disease -EGD today noted gastric polyp which was removed, suspected to be source of blood loss per gastroenterology -Continue IV iron, hemoglobin 7.2 this a.m.   AKI on CKD 4 Hyponatremia Volume overload -Creatinine 3.2 on admission up from baseline of 2.3-3.0 -Likely secondary to blood loss as above, transfused 2 units of PRBC overnight -Hold Cozaar -Diuresed with IV Lasix X 2 days, he is -4.6 L, volume status has improved -Transition to oral Lasix yesterday, discussed with nephrology, switch to 40 mg daily -Discussed with patient and daughter yesterday at that I do not think he would be a good dialysis candidate   Valvular heart disease,  moderate to severe MR, LVH Pulmonary hypertension -Volume status improving, diuretics as above   Hypertension -Hold amlodipine, Cozaar and hydralazine  Hypokalemia -Replaced   DVT prophylaxis: SCDs CODE STATUS: DNR, after discussion with patient and his daughter yesterday Family communication: Discussed with patient and daughter at bedside yesterday Disposition: Home pending GI work-up,  clinical improvement Status is: Inpatient  Remains inpatient appropriate because: Severity of illness  Consultants:  Gastroenterology  Procedures: EGD noted gastric polyp which was removed  Antimicrobials:    Subjective: -Feels okay, just back from endoscopy  Objective: Vitals:   05/05/21 0925 05/05/21 0930 05/05/21 0945 05/05/21 1000  BP: 114/72 103/74 105/74 125/83  Pulse: 74 86 79 67  Resp: (!) 22 17 18 18   Temp: 97.7 F (36.5 C)  97.7 F (36.5 C) (!) 97.4 F (36.3 C)  TempSrc:      SpO2: 99% 98% 94% 97%  Weight:      Height:        Intake/Output Summary (Last 24 hours) at 05/05/2021 1043 Last data filed at 05/05/2021 0919 Gross per 24 hour  Intake 1145 ml  Output 1100 ml  Net 45 ml   Filed Weights   05/03/21 0231 05/04/21 0416 05/05/21 0756  Weight: 75.8 kg 68.9 kg 68.9 kg    Examination:  General exam: Pleasant elderly male sitting up in bed, AAO x2, no distress CVS: S1-S2, regular rate rhythm Lungs: Decreased breath sounds to bases Abdomen: Soft, nontender, bowel sounds present Extremities: Trace edema  Skin: No rashes on exposed skin Psych: Appropriate mood and affect   Data Reviewed:   CBC: Recent Labs  Lab 05/01/21 2017 05/02/21 0234 05/02/21 1030 05/03/21 0310 05/04/21 0314 05/05/21 0242  WBC 6.9 6.8 6.2 6.0 6.8 7.5  NEUTROABS 5.3  --   --   --   --   --   HGB 6.4* 6.1* 7.7* 7.2* 8.1* 7.2*  HCT 18.9* 19.1* 23.1* 21.3* 23.5* 22.0*  MCV 93.6 95.0 90.9 89.1 89.0 93.2  PLT 281 263 233 241 234 226  Basic Metabolic Panel: Recent Labs  Lab 05/02/21 0234 05/02/21 1453 05/03/21 0310 05/04/21 0314 05/05/21 0242  NA 123* 126* 126* 126* 128*  K 3.6 3.5 3.1* 3.6 3.8  CL 96* 95* 97* 95* 98  CO2 20* 20* 22 24 23   GLUCOSE 97 110* 87 84 96  BUN 25* 27* 27* 25* 26*  CREATININE 3.21* 3.10* 3.23* 3.21* 3.07*  CALCIUM 7.9* 8.0* 7.6* 7.6* 7.6*   GFR: Estimated Creatinine Clearance: 15.5 mL/min (A) (by C-G formula based on SCr of 3.07  mg/dL (H)). Liver Function Tests: No results for input(s): AST, ALT, ALKPHOS, BILITOT, PROT, ALBUMIN in the last 168 hours. No results for input(s): LIPASE, AMYLASE in the last 168 hours. No results for input(s): AMMONIA in the last 168 hours. Coagulation Profile: No results for input(s): INR, PROTIME in the last 168 hours. Cardiac Enzymes: No results for input(s): CKTOTAL, CKMB, CKMBINDEX, TROPONINI in the last 168 hours. BNP (last 3 results) No results for input(s): PROBNP in the last 8760 hours. HbA1C: No results for input(s): HGBA1C in the last 72 hours. CBG: No results for input(s): GLUCAP in the last 168 hours. Lipid Profile: No results for input(s): CHOL, HDL, LDLCALC, TRIG, CHOLHDL, LDLDIRECT in the last 72 hours. Thyroid Function Tests: No results for input(s): TSH, T4TOTAL, FREET4, T3FREE, THYROIDAB in the last 72 hours. Anemia Panel: No results for input(s): VITAMINB12, FOLATE, FERRITIN, TIBC, IRON, RETICCTPCT in the last 72 hours.  Urine analysis:    Component Value Date/Time   COLORURINE YELLOW 11/30/2020 1129   APPEARANCEUR CLOUDY (A) 11/30/2020 1129   LABSPEC 1.010 11/30/2020 1129   PHURINE 7.0 11/30/2020 1129   GLUCOSEU NEGATIVE 11/30/2020 1129   HGBUR MODERATE (A) 11/30/2020 1129   BILIRUBINUR NEGATIVE 11/30/2020 1129   KETONESUR NEGATIVE 11/30/2020 1129   PROTEINUR 100 (A) 11/30/2020 1129   UROBILINOGEN 0.2 01/11/2015 2204   NITRITE NEGATIVE 11/30/2020 1129   LEUKOCYTESUR LARGE (A) 11/30/2020 1129   Sepsis Labs: @LABRCNTIP (procalcitonin:4,lacticidven:4)  ) Recent Results (from the past 240 hour(s))  Resp Panel by RT-PCR (Flu A&B, Covid) Nasopharyngeal Swab     Status: None   Collection Time: 05/01/21  9:37 PM   Specimen: Nasopharyngeal Swab; Nasopharyngeal(NP) swabs in vial transport medium  Result Value Ref Range Status   SARS Coronavirus 2 by RT PCR NEGATIVE NEGATIVE Final    Comment: (NOTE) SARS-CoV-2 target nucleic acids are NOT DETECTED.  The  SARS-CoV-2 RNA is generally detectable in upper respiratory specimens during the acute phase of infection. The lowest concentration of SARS-CoV-2 viral copies this assay can detect is 138 copies/mL. A negative result does not preclude SARS-Cov-2 infection and should not be used as the sole basis for treatment or other patient management decisions. A negative result may occur with  improper specimen collection/handling, submission of specimen other than nasopharyngeal swab, presence of viral mutation(s) within the areas targeted by this assay, and inadequate number of viral copies(<138 copies/mL). A negative result must be combined with clinical observations, patient history, and epidemiological information. The expected result is Negative.  Fact Sheet for Patients:  EntrepreneurPulse.com.au  Fact Sheet for Healthcare Providers:  IncredibleEmployment.be  This test is no t yet approved or cleared by the [place] FDA and  has been authorized for detection and/or diagnosis of SARS-CoV-2 by FDA under an Emergency Use Authorization (EUA). This EUA will remain  in effect (meaning this test can be used) for the duration of the COVID-19 declaration under Section 564(b)(1) of the Act, 21 U.S.C.section 360bbb-3(b)(1), unless the  authorization is terminated  or revoked sooner.       Influenza A by PCR NEGATIVE NEGATIVE Final   Influenza B by PCR NEGATIVE NEGATIVE Final    Comment: (NOTE) The Xpert Xpress SARS-CoV-2/FLU/RSV plus assay is intended as an aid in the diagnosis of influenza from Nasopharyngeal swab specimens and should not be used as a sole basis for treatment. Nasal washings and aspirates are unacceptable for Xpert Xpress SARS-CoV-2/FLU/RSV testing.  Fact Sheet for Patients: EntrepreneurPulse.com.au  Fact Sheet for Healthcare Providers: IncredibleEmployment.be  This test is not yet approved or  cleared by the [place] FDA and has been authorized for detection and/or diagnosis of SARS-CoV-2 by FDA under an Emergency Use Authorization (EUA). This EUA will remain in effect (meaning this test can be used) for the duration of the COVID-19 declaration under Section 564(b)(1) of the Act, 21 U.S.C. section 360bbb-3(b)(1), unless the authorization is terminated or revoked.  Performed at [facility], [place] [address] [place], [place] [address]      Radiology Studies: No results found.  Scheduled Meds:  sodium chloride   Intravenous Once   atorvastatin  10 mg Oral QHS   furosemide  80 mg Oral Daily   latanoprost  1 drop Both Eyes QHS   pantoprazole (PROTONIX) IV  40 mg Intravenous Q12H   sodium chloride flush  3 mL Intravenous Q12H   Continuous Infusions:  sodium chloride     ferric gluconate (FERRLECIT) IVPB 250 mg (05/05/21 1000)     LOS: 3 days    Time spent: 23min  Domenic Polite, MD [facility]   05/05/2021, 10:43 AM

## 2021-05-05 NOTE — Interval H&P Note (Signed)
History and Physical Interval Note:  05/05/2021 8:00 AM  Rick [name]  has presented today for surgery, with the diagnosis of anemia, hemoccult positive stool.  The various methods of treatment have been discussed with the patient and family. After consideration of risks, benefits and other options for treatment, the patient has consented to  Procedure(s): ESOPHAGOGASTRODUODENOSCOPY (EGD) WITH PROPOFOL (N/A) as a surgical intervention.  The patient's history has been reviewed, patient examined, no change in status, stable for surgery.  I have reviewed the patient's chart and labs.  Questions were answered to the patient's satisfaction.     Thornton Park

## 2021-05-05 NOTE — Anesthesia Postprocedure Evaluation (Signed)
Anesthesia Post Note  Patient: Rick [name]  Procedure(s) Performed: ESOPHAGOGASTRODUODENOSCOPY (EGD) WITH PROPOFOL BIOPSY POLYPECTOMY     Patient location during evaluation: PACU Anesthesia Type: MAC Level of consciousness: awake and alert Pain management: pain level controlled Vital Signs Assessment: post-procedure vital signs reviewed and stable Respiratory status: spontaneous breathing, nonlabored ventilation, respiratory function stable and patient connected to nasal cannula oxygen Cardiovascular status: stable and blood pressure returned to baseline Postop Assessment: no apparent nausea or vomiting Anesthetic complications: no   No notable events documented.  Last Vitals:  Vitals:   05/05/21 1455 05/05/21 1530  BP: 128/81 123/80  Pulse: 77 70  Resp:  18  Temp: 36.6 C 36.5 C  SpO2: 95% 100%    Last Pain:  Vitals:   05/05/21 1530  TempSrc: Oral  PainSc:                  March Rummage Audrick Lamoureaux

## 2021-05-06 ENCOUNTER — Telehealth: Payer: Self-pay

## 2021-05-06 ENCOUNTER — Encounter (HOSPITAL_COMMUNITY): Payer: Self-pay | Admitting: Gastroenterology

## 2021-05-06 ENCOUNTER — Ambulatory Visit: Payer: Medicare PPO | Admitting: Internal Medicine

## 2021-05-06 LAB — BASIC METABOLIC PANEL
Anion gap: 9 (ref 5–15)
BUN: 23 mg/dL (ref 8–23)
CO2: 23 mmol/L (ref 22–32)
Calcium: 7.9 mg/dL — ABNORMAL LOW (ref 8.9–10.3)
Chloride: 95 mmol/L — ABNORMAL LOW (ref 98–111)
Creatinine, Ser: 2.79 mg/dL — ABNORMAL HIGH (ref 0.61–1.24)
GFR, Estimated: 21 mL/min — ABNORMAL LOW (ref >60)
Glucose, Bld: 118 mg/dL — ABNORMAL HIGH (ref 70–99)
Potassium: 3.4 mmol/L — ABNORMAL LOW (ref 3.5–5.1)
Sodium: 127 mmol/L — ABNORMAL LOW (ref 135–145)

## 2021-05-06 LAB — TYPE AND SCREEN
ABO/RH(D): B POS
Antibody Screen: NEGATIVE
Unit division: 0

## 2021-05-06 LAB — COMPREHENSIVE METABOLIC PANEL
ALT: 11 U/L (ref 0–44)
AST: 20 U/L (ref 15–41)
Albumin: 2.1 g/dL — ABNORMAL LOW (ref 3.5–5.0)
Alkaline Phosphatase: 46 U/L (ref 38–126)
Anion gap: 10 (ref 5–15)
BUN: 24 mg/dL — ABNORMAL HIGH (ref 8–23)
CO2: 21 mmol/L — ABNORMAL LOW (ref 22–32)
Calcium: 7.6 mg/dL — ABNORMAL LOW (ref 8.9–10.3)
Chloride: 93 mmol/L — ABNORMAL LOW (ref 98–111)
Creatinine, Ser: 2.79 mg/dL — ABNORMAL HIGH (ref 0.61–1.24)
GFR, Estimated: 21 mL/min — ABNORMAL LOW (ref >60)
Glucose, Bld: 81 mg/dL (ref 70–99)
Potassium: 3.3 mmol/L — ABNORMAL LOW (ref 3.5–5.1)
Sodium: 124 mmol/L — ABNORMAL LOW (ref 135–145)
Total Bilirubin: 0.9 mg/dL (ref 0.3–1.2)
Total Protein: 6.4 g/dL — ABNORMAL LOW (ref 6.5–8.1)

## 2021-05-06 LAB — CBC
HCT: 26.3 % — ABNORMAL LOW (ref 39.0–52.0)
HCT: 28.1 % — ABNORMAL LOW (ref 39.0–52.0)
Hemoglobin: 9.2 g/dL — ABNORMAL LOW (ref 13.0–17.0)
Hemoglobin: 9.7 g/dL — ABNORMAL LOW (ref 13.0–17.0)
MCH: 31.1 pg (ref 26.0–34.0)
MCH: 31 pg (ref 26.0–34.0)
MCHC: 35 g/dL (ref 30.0–36.0)
MCHC: 34.5 g/dL (ref 30.0–36.0)
MCV: 88.9 fL (ref 80.0–100.0)
MCV: 89.8 fL (ref 80.0–100.0)
Platelets: 226 10*3/uL (ref 150–400)
Platelets: 234 10*3/uL (ref 150–400)
RBC: 2.96 MIL/uL — ABNORMAL LOW (ref 4.22–5.81)
RBC: 3.13 MIL/uL — ABNORMAL LOW (ref 4.22–5.81)
RDW: 17.2 % — ABNORMAL HIGH (ref 11.5–15.5)
RDW: 17.3 % — ABNORMAL HIGH (ref 11.5–15.5)
WBC: 8.6 10*3/uL (ref 4.0–10.5)
WBC: 9.3 10*3/uL (ref 4.0–10.5)
nRBC: 0 % (ref 0.0–0.2)
nRBC: 0 % (ref 0.0–0.2)

## 2021-05-06 LAB — BPAM RBC
Blood Product Expiration Date: 202212122359
ISSUE DATE / TIME: 202211271429
Unit Type and Rh: 7300

## 2021-05-06 MED ORDER — PANTOPRAZOLE SODIUM 40 MG PO TBEC
40.0000 mg | DELAYED_RELEASE_TABLET | Freq: Two times a day (BID) | ORAL | Status: DC
  Administered 2021-05-06 – 2021-05-07 (×2): 40 mg via ORAL
  Filled 2021-05-06 (×2): qty 1

## 2021-05-06 MED ORDER — POTASSIUM CHLORIDE CRYS ER 20 MEQ PO TBCR
40.0000 meq | EXTENDED_RELEASE_TABLET | Freq: Once | ORAL | Status: AC
  Administered 2021-05-06: 08:00:00 40 meq via ORAL
  Filled 2021-05-06: qty 2

## 2021-05-06 MED ORDER — POTASSIUM CHLORIDE CRYS ER 20 MEQ PO TBCR
40.0000 meq | EXTENDED_RELEASE_TABLET | Freq: Once | ORAL | Status: AC
  Administered 2021-05-06: 13:00:00 40 meq via ORAL
  Filled 2021-05-06: qty 2

## 2021-05-06 NOTE — Telephone Encounter (Signed)
Appt today with Dr. Harrington Challenger for post hosp consult back in 11/2020 cancelled due to pt is in the hospital currently with GI Bleed.   Per Dr. Harrington Challenger will make him an appt for a few months. Her next available to have a plan to see him at some point will plan to follow up with him after he is discharged form the hospital.

## 2021-05-06 NOTE — Evaluation (Signed)
Occupational Therapy Evaluation Patient Details Name: Rick [name] MRN: [id number] DOB: Nov 26, 1928 Today's Date: 05/06/2021   History of Present Illness Pt is a 87 y.o. male who presented 05/01/21 with weakness, worsening hyponatremia in the setting of CKD. Pt also with heme positive stools. Plan for EGD 11/27. PMH: CKD 4, valvular heart disease, moderate to severe MR, LVH, pulmonary hypertension, essential hypertension, dyslipidemia, prostate cancer   Clinical Impression   Pt is functioning at his baseline. Recommended tub transfer bench for fall prevention during showering. No further OT needs.      Recommendations for follow up therapy are one component of a multi-disciplinary discharge planning process, led by the attending physician.  Recommendations may be updated based on patient status, additional functional criteria and insurance authorization.   Follow Up Recommendations  No OT follow up    Assistance Recommended at Discharge    Functional Status Assessment  Patient has had a recent decline in their functional status and demonstrates the ability to make significant improvements in function in a reasonable and predictable amount of time.  Equipment Recommendations       Recommendations for Other Services       Precautions / Restrictions Precautions Precautions: Fall Precaution Comments: denies falls at home      Mobility Bed Mobility Overal bed mobility: Modified Independent             General bed mobility comments: HOB up, no physical assist    Transfers Overall transfer level: Needs assistance Equipment used: Straight cane Transfers: Sit to/from Stand Sit to Stand: Supervision           General transfer comment: Supervision for safety, no LOB.      Balance Overall balance assessment: Mild deficits observed, not formally tested                                         ADL either performed or assessed with clinical judgement    ADL                                               Vision Baseline Vision/History: 1 Wears glasses Ability to See in Adequate Light: 0 Adequate Patient Visual Report: No change from baseline       Perception     Praxis      Pertinent Vitals/Pain Pain Assessment: No/denies pain     Hand Dominance Right   Extremity/Trunk Assessment Upper Extremity Assessment Upper Extremity Assessment: Overall WFL for tasks assessed   Lower Extremity Assessment Lower Extremity Assessment: Defer to PT evaluation   Cervical / Trunk Assessment Cervical / Trunk Assessment: Kyphotic   Communication Communication Communication: HOH   Cognition Arousal/Alertness: Awake/alert Behavior During Therapy: WFL for tasks assessed/performed Overall Cognitive Status: Within Functional Limits for tasks assessed                                       General Comments       Exercises     Shoulder Instructions      Home Living Family/patient expects to be discharged to:: Private residence Living Arrangements: Spouse/significant other Available Help at Discharge: Family;Available 24 hours/day Type of Home: House  Home Access: Stairs to enter Entrance Stairs-Number of Steps: 3 Entrance Stairs-Rails: Right Home Layout: One level     Bathroom Shower/Tub: Teacher, early years/pre: Handicapped height     Home Equipment: [place] - single point;Standard Environmental consultant          Prior Functioning/Environment Prior Level of Function : Independent/Modified Independent;Driving             Mobility Comments: Pt uses SPC. Denies any recent falls. ADLs Comments: wife supervises tub transfer        OT Problem List:        OT Treatment/Interventions:      OT Goals(Current goals can be found in the care plan section)    OT Frequency:     Barriers to D/C:            Co-evaluation              AM-PAC OT "6 Clicks" Daily Activity     Outcome  Measure Help from another person eating meals?: None Help from another person taking care of personal grooming?: None Help from another person toileting, which includes using toliet, bedpan, or urinal?: None Help from another person bathing (including washing, rinsing, drying)?: None Help from another person to put on and taking off regular upper body clothing?: None Help from another person to put on and taking off regular lower body clothing?: None 6 Click Score: 24   End of Session Equipment Utilized During Treatment: Gait belt;Other (comment) (SPC)  Activity Tolerance: Patient tolerated treatment well Patient left: in bed;with call bell/phone within reach;with family/visitor present  OT Visit Diagnosis: Other abnormalities of gait and mobility (R26.89)                Time: 1700-1749 OT Time Calculation (min): 16 min Charges:  OT General Charges $OT Visit: 1 Visit OT Evaluation $OT Eval Low Complexity: 1 Low  Nestor Lewandowsky, OTR/L Acute Rehabilitation Services Pager: [phone] Office: [phone]   Malka So 05/06/2021, 2:26 PM

## 2021-05-06 NOTE — Progress Notes (Signed)
PT Cancellation Note  Patient Details Name: Rick [name] MRN: [id number] DOB: February 02, 1929   Cancelled Treatment:    Reason Eval/Treat Not Completed: PT screened, no needs identified, will sign off  Please see PT evaluation of 05/04/21 with pt at baseline for mobility. No follow-up needs identified.    Arby Barrette, PT Acute Rehabilitation Services  Pager [phone] Office [phone]    Rexanne Mano 05/06/2021, 3:37 PM

## 2021-05-06 NOTE — Progress Notes (Signed)
PHARMACIST - PHYSICIAN COMMUNICATION  DR:   Broadus John  CONCERNING: IV to Oral Route Change Policy  RECOMMENDATION: This patient is receiving Protonix by the intravenous route.  Based on criteria approved by the Pharmacy and Therapeutics Committee, the intravenous medication(s) is/are being converted to the equivalent oral dose form(s).   DESCRIPTION: These criteria include: The patient is eating (either orally or via tube) and/or has been taking other orally administered medications for a least 24 hours The patient has no evidence of active gastrointestinal bleeding or impaired GI absorption (gastrectomy, short bowel, patient on TNA or NPO). Ok to continue BID per GI  If you have questions about this conversion, please contact the Pharmacy Department  []   [phone] )  [name] []   [phone] )  [name] [x]   [phone] )  [name] []   [phone] )  [facility] []   [phone] )  [facility]

## 2021-05-06 NOTE — Care Management Important Message (Signed)
Important Message  Patient Details  Name: ANDREA COLGLAZIER MRN: [id number] Date of Birth: 11-Sep-1928   Medicare Important Message Given:  Yes     [name] 05/06/2021, 10:33 AM

## 2021-05-06 NOTE — Progress Notes (Signed)
Mobility Specialist Progress Note    05/06/21 1636  Mobility  Activity Ambulated in hall  Level of Assistance Modified independent, requires aide device or extra time  Assistive Device [facility] Ambulated (ft) 480 ft  Mobility Ambulated independently in hallway  Mobility Response Tolerated well  Mobility performed by Mobility specialist  $Mobility charge 1 Mobility   Pt received in bed and agreeable. No complaints on walk. Had x1 LOB episode. Returned to bed with call bell in reach and family present.   [facility] Mobility Specialist  M.S. Primary Phone: [phone] M.S. Secondary Phone: [phone]

## 2021-05-06 NOTE — Plan of Care (Signed)
  Problem: Education: Goal: Knowledge of General Education information will improve Description: Including pain rating scale, medication(s)/side effects and non-pharmacologic comfort measures Outcome: Progressing   Problem: Health Behavior/Discharge Planning: Goal: Ability to manage health-related needs will improve Outcome: Progressing   Problem: Clinical Measurements: Goal: Diagnostic test results will improve Outcome: Progressing   

## 2021-05-06 NOTE — Progress Notes (Signed)
PROGRESS NOTE    BRIGHTON PILLEY  HQI:[id number] DOB: 1929-02-20 DOA: 05/01/2021 PCP: Tonia Ghent, MD  Brief Narrative:89/M with history of CKD 4, valvular heart disease, moderate to severe MR, LVH, pulmonary hypertension, essential hypertension, dyslipidemia, prostate cancer was sent to the ED by his nephrologist due to weakness, worsening hyponatremia in the setting of CKD. -While in the ER labs noted hemoglobin of 6.4 with heme positive stools, sodium was 122, creatinine was 3.2 slightly higher from baseline of 2.3-3.0, patient reported dark/black stools off and on the last few weeks   Assessment & Plan:   Acute blood loss anemia Heme positive stools/melena -Hemoglobin 6.4 on admission, baseline was 12.6 in July'22 -Transfused 2 units of PRBC -Gastroenterology consulted, underwent endoscopy yesterday which noted multiple gastric polyps, largest polyp was removed, per Dr. Tarri Glenn suspected to be the source -Anemia panel with component of iron deficiency and chronic disease -Given IV iron, hemoglobin up to 9.2 which may be a lab error, repeat CBC this afternoon   AKI on CKD 4 Hyponatremia Volume overload -Creatinine 3.2 on admission up from baseline of 2.3-3.0 -Likely secondary to blood loss as above, transfused 2 units of PRBC this admission -Cozaar held -Diuresed with IV Lasix X 2 days, he is -4.2L, volume status has improved -Transitioned to oral diuretics, discussed with nephrology, switch to 40 Mg Lasix daily -Sodium dropped down to 124 this morning, despite overall clinical improvement, will repeat -Discussed with patient and daughter 11/27 at that I do not think he would be a good dialysis candidate   Valvular heart disease,  moderate to severe MR, LVH Pulmonary hypertension -Volume status improving, diuretics as above   Hypertension -Hold amlodipine, Cozaar and hydralazine  Hypokalemia -Replaced   DVT prophylaxis: SCDs CODE STATUS: DNR, after discussion with  patient and his daughter  Family communication: No family at bedside, discussed with patient and daughter yesterday Disposition: Home in 1 to 2 days if stable Status is: Inpatient  Remains inpatient appropriate because: Severity of illness  Consultants:  Gastroenterology  Procedures: EGD noted gastric polyp which was removed  Antimicrobials:    Subjective: -Feels better overall, asking when he can go home  Objective: Vitals:   05/05/21 1901 05/05/21 1959 05/06/21 0351 05/06/21 1110  BP: 130/84 125/83 120/83 130/82  Pulse: 82 81 83 76  Resp: 16 19 17 18   Temp: 97.8 F (36.6 C) 98.3 F (36.8 C) 98.1 F (36.7 C) 98.1 F (36.7 C)  TempSrc: Oral Oral Oral Oral  SpO2: 97% 95% 95% 96%  Weight:   65.3 kg   Height:        Intake/Output Summary (Last 24 hours) at 05/06/2021 1117 Last data filed at 05/06/2021 1033 Gross per 24 hour  Intake 2119.17 ml  Output 2000 ml  Net 119.17 ml   Filed Weights   05/04/21 0416 05/05/21 0756 05/06/21 0351  Weight: 68.9 kg 68.9 kg 65.3 kg    Examination:  General exam: Pleasant elderly male sitting up in bed, AAO x2, no distress CVS: S1-S2, regular rate rhythm Lungs: Decreased breath sounds the bases Abdomen: Soft, nontender, bowel sounds present Extremities: Trace edema only  Skin: No rashes on exposed skin Psych: Appropriate mood and affect   Data Reviewed:   CBC: Recent Labs  Lab 05/01/21 2017 05/02/21 0234 05/02/21 1030 05/03/21 0310 05/04/21 0314 05/05/21 0242 05/06/21 0301  WBC 6.9   < > 6.2 6.0 6.8 7.5 8.6  NEUTROABS 5.3  --   --   --   --   --   --  HGB 6.4*   < > 7.7* 7.2* 8.1* 7.2* 9.2*  HCT 18.9*   < > 23.1* 21.3* 23.5* 22.0* 26.3*  MCV 93.6   < > 90.9 89.1 89.0 93.2 88.9  PLT 281   < > 233 241 234 226 226   < > = values in this interval not displayed.   Basic Metabolic Panel: Recent Labs  Lab 05/02/21 1453 05/03/21 0310 05/04/21 0314 05/05/21 0242 05/06/21 0301  NA 126* 126* 126* 128* 124*  K 3.5  3.1* 3.6 3.8 3.3*  CL 95* 97* 95* 98 93*  CO2 20* 22 24 23  21*  GLUCOSE 110* 87 84 96 81  BUN 27* 27* 25* 26* 24*  CREATININE 3.10* 3.23* 3.21* 3.07* 2.79*  CALCIUM 8.0* 7.6* 7.6* 7.6* 7.6*   GFR: Estimated Creatinine Clearance: 16.3 mL/min (A) (by C-G formula based on SCr of 2.79 mg/dL (H)). Liver Function Tests: Recent Labs  Lab 05/06/21 0301  AST 20  ALT 11  ALKPHOS 46  BILITOT 0.9  PROT 6.4*  ALBUMIN 2.1*   No results for input(s): LIPASE, AMYLASE in the last 168 hours. No results for input(s): AMMONIA in the last 168 hours. Coagulation Profile: No results for input(s): INR, PROTIME in the last 168 hours. Cardiac Enzymes: No results for input(s): CKTOTAL, CKMB, CKMBINDEX, TROPONINI in the last 168 hours. BNP (last 3 results) No results for input(s): PROBNP in the last 8760 hours. HbA1C: No results for input(s): HGBA1C in the last 72 hours. CBG: No results for input(s): GLUCAP in the last 168 hours. Lipid Profile: No results for input(s): CHOL, HDL, LDLCALC, TRIG, CHOLHDL, LDLDIRECT in the last 72 hours. Thyroid Function Tests: No results for input(s): TSH, T4TOTAL, FREET4, T3FREE, THYROIDAB in the last 72 hours. Anemia Panel: No results for input(s): VITAMINB12, FOLATE, FERRITIN, TIBC, IRON, RETICCTPCT in the last 72 hours.  Urine analysis:    Component Value Date/Time   COLORURINE YELLOW 11/30/2020 1129   APPEARANCEUR CLOUDY (A) 11/30/2020 1129   LABSPEC 1.010 11/30/2020 1129   PHURINE 7.0 11/30/2020 1129   GLUCOSEU NEGATIVE 11/30/2020 1129   HGBUR MODERATE (A) 11/30/2020 1129   BILIRUBINUR NEGATIVE 11/30/2020 1129   KETONESUR NEGATIVE 11/30/2020 1129   PROTEINUR 100 (A) 11/30/2020 1129   UROBILINOGEN 0.2 01/11/2015 2204   NITRITE NEGATIVE 11/30/2020 1129   LEUKOCYTESUR LARGE (A) 11/30/2020 1129   Sepsis Labs: @LABRCNTIP (procalcitonin:4,lacticidven:4)  ) Recent Results (from the past 240 hour(s))  Resp Panel by RT-PCR (Flu A&B, Covid) Nasopharyngeal  Swab     Status: None   Collection Time: 05/01/21  9:37 PM   Specimen: Nasopharyngeal Swab; Nasopharyngeal(NP) swabs in vial transport medium  Result Value Ref Range Status   SARS Coronavirus 2 by RT PCR NEGATIVE NEGATIVE Final    Comment: (NOTE) SARS-CoV-2 target nucleic acids are NOT DETECTED.  The SARS-CoV-2 RNA is generally detectable in upper respiratory specimens during the acute phase of infection. The lowest concentration of SARS-CoV-2 viral copies this assay can detect is 138 copies/mL. A negative result does not preclude SARS-Cov-2 infection and should not be used as the sole basis for treatment or other patient management decisions. A negative result may occur with  improper specimen collection/handling, submission of specimen other than nasopharyngeal swab, presence of viral mutation(s) within the areas targeted by this assay, and inadequate number of viral copies(<138 copies/mL). A negative result must be combined with clinical observations, patient history, and epidemiological information. The expected result is Negative.  Fact Sheet for Patients:  EntrepreneurPulse.com.au  Fact  Sheet for Healthcare Providers:  IncredibleEmployment.be  This test is no t yet approved or cleared by the [place] FDA and  has been authorized for detection and/or diagnosis of SARS-CoV-2 by FDA under an Emergency Use Authorization (EUA). This EUA will remain  in effect (meaning this test can be used) for the duration of the COVID-19 declaration under Section 564(b)(1) of the Act, 21 U.S.C.section 360bbb-3(b)(1), unless the authorization is terminated  or revoked sooner.       Influenza A by PCR NEGATIVE NEGATIVE Final   Influenza B by PCR NEGATIVE NEGATIVE Final    Comment: (NOTE) The Xpert Xpress SARS-CoV-2/FLU/RSV plus assay is intended as an aid in the diagnosis of influenza from Nasopharyngeal swab specimens and should not be used as a sole  basis for treatment. Nasal washings and aspirates are unacceptable for Xpert Xpress SARS-CoV-2/FLU/RSV testing.  Fact Sheet for Patients: EntrepreneurPulse.com.au  Fact Sheet for Healthcare Providers: IncredibleEmployment.be  This test is not yet approved or cleared by the [place] FDA and has been authorized for detection and/or diagnosis of SARS-CoV-2 by FDA under an Emergency Use Authorization (EUA). This EUA will remain in effect (meaning this test can be used) for the duration of the COVID-19 declaration under Section 564(b)(1) of the Act, 21 U.S.C. section 360bbb-3(b)(1), unless the authorization is terminated or revoked.  Performed at [facility], [place] [address] [place], [place] [address]      Radiology Studies: No results found.  Scheduled Meds:  sodium chloride   Intravenous Once   atorvastatin  10 mg Oral QHS   furosemide  40 mg Oral Daily   latanoprost  1 drop Both Eyes QHS   pantoprazole  40 mg Oral BID   sodium chloride flush  3 mL Intravenous Q12H   Continuous Infusions:  sodium chloride     ferric gluconate (FERRLECIT) IVPB 250 mg (05/06/21 0903)     LOS: 4 days    Time spent: 47min  Domenic Polite, MD [facility]   05/06/2021, 11:17 AM

## 2021-05-07 ENCOUNTER — Other Ambulatory Visit (HOSPITAL_COMMUNITY): Payer: Self-pay

## 2021-05-07 LAB — BASIC METABOLIC PANEL
Anion gap: 10 (ref 5–15)
BUN: 23 mg/dL (ref 8–23)
CO2: 20 mmol/L — ABNORMAL LOW (ref 22–32)
Calcium: 8 mg/dL — ABNORMAL LOW (ref 8.9–10.3)
Chloride: 98 mmol/L (ref 98–111)
Creatinine, Ser: 2.76 mg/dL — ABNORMAL HIGH (ref 0.61–1.24)
GFR, Estimated: 21 mL/min — ABNORMAL LOW (ref >60)
Glucose, Bld: 81 mg/dL (ref 70–99)
Potassium: 4.1 mmol/L (ref 3.5–5.1)
Sodium: 128 mmol/L — ABNORMAL LOW (ref 135–145)

## 2021-05-07 LAB — SURGICAL PATHOLOGY

## 2021-05-07 LAB — CBC
HCT: 27.3 % — ABNORMAL LOW (ref 39.0–52.0)
Hemoglobin: 9.1 g/dL — ABNORMAL LOW (ref 13.0–17.0)
MCH: 30.2 pg (ref 26.0–34.0)
MCHC: 33.3 g/dL (ref 30.0–36.0)
MCV: 90.7 fL (ref 80.0–100.0)
Platelets: 252 10*3/uL (ref 150–400)
RBC: 3.01 MIL/uL — ABNORMAL LOW (ref 4.22–5.81)
RDW: 17.2 % — ABNORMAL HIGH (ref 11.5–15.5)
WBC: 10.3 10*3/uL (ref 4.0–10.5)
nRBC: 0 % (ref 0.0–0.2)

## 2021-05-07 MED ORDER — SENNOSIDES-DOCUSATE SODIUM 8.6-50 MG PO TABS
1.0000 | ORAL_TABLET | Freq: Every evening | ORAL | 0 refills | Status: DC | PRN
  Filled 2021-05-07: qty 10, 10d supply, fill #0

## 2021-05-07 MED ORDER — FERROUS SULFATE 325 (65 FE) MG PO TABS
325.0000 mg | ORAL_TABLET | Freq: Two times a day (BID) | ORAL | 0 refills | Status: DC
  Filled 2021-05-07: qty 60, 30d supply, fill #0

## 2021-05-07 MED ORDER — PANTOPRAZOLE SODIUM 40 MG PO TBEC
40.0000 mg | DELAYED_RELEASE_TABLET | Freq: Every day | ORAL | 0 refills | Status: DC
Start: 2021-05-07 — End: 2021-05-07
  Filled 2021-05-07: qty 30, 30d supply, fill #0

## 2021-05-07 MED ORDER — FUROSEMIDE 40 MG PO TABS
40.0000 mg | ORAL_TABLET | Freq: Every day | ORAL | 0 refills | Status: DC
Start: 2021-05-07 — End: 2021-10-25
  Filled 2021-05-07: qty 30, 30d supply, fill #0

## 2021-05-07 NOTE — Plan of Care (Signed)
  Problem: Education: Goal: Knowledge of General Education information will improve Description Including pain rating scale, medication(s)/side effects and non-pharmacologic comfort measures Outcome: Progressing   Problem: Health Behavior/Discharge Planning: Goal: Ability to manage health-related needs will improve Outcome: Progressing   

## 2021-05-07 NOTE — Discharge Summary (Signed)
Physician Discharge Summary  Rick [name] LNL:[id number] DOB: May 15, 1929 DOA: 05/01/2021  PCP: Tonia Ghent, MD  Admit date: 05/01/2021 Discharge date: 05/07/2021  Time spent: 35 minutes  Recommendations for Outpatient Follow-up:  PCP in 1 week, please monitor CBC periodically Nephrology Dr. Candiss Norse in 2 weeks, needs ongoing goals of care discussions   Discharge Diagnoses:  AKI on CKD 4 Hyponatremia Upper GI bleed Iron deficiency anemia   Essential hypertension   CKD (chronic kidney disease), stage IV (HCC)   Anemia due to stage 4 chronic kidney disease (HCC)   Hyponatremia   Acute on chronic heart failure with preserved ejection fraction (HFpEF) ([place])   Gastritis and gastroduodenitis   Gastric polyp DO NOT RESUSCITATE  Discharge Condition: Stable  Diet recommendation: Low-sodium  Filed Weights   05/05/21 0756 05/06/21 0351 05/07/21 0354  Weight: 68.9 kg 65.3 kg 63.1 kg    History of present illness:  89/M with history of CKD 4, valvular heart disease, moderate to severe MR, LVH, pulmonary hypertension, essential hypertension, dyslipidemia, prostate cancer was sent to the ED by his nephrologist due to weakness, worsening hyponatremia in the setting of CKD. -While in the ER labs noted hemoglobin of 6.4 with heme positive stools, sodium was 122, creatinine was 3.2 slightly higher from baseline of 2.3-3.0, patient reported dark/black stools off and on the last few weeks  Hospital Course:   Acute blood loss anemia Heme positive stools/melena -Hemoglobin 6.4 on admission, baseline was 12.6 in July'22 -Transfused 2 units of PRBC -Gastroenterology consulted, underwent endoscopy 11/27 which noted multiple gastric polyps, largest polyp was removed, per Dr. Tarri Glenn suspected to be the source of bleeding -Anemia panel noted component of iron deficiency and chronic disease -Given IV iron, hemoglobin 9.1 at discharge, added oral iron -Monitor hemoglobin periodically   AKI  on CKD 4 Hyponatremia Volume overload -Creatinine 3.2 on admission up from baseline of 2.3-3.0 with sodium of 122 -Likely secondary to blood loss as above, transfused 2 units of PRBC this admission -Cozaar discontinued -Diuresed with IV Lasix X 3 days, he is 5 L negative, volume status has improved -Transitioned to oral diuretics, discussed with nephrology, switch to 40 Mg Lasix daily -Sodium improved to 128 at discharge, clinically asymptomatic at this level -Discussed with patient and daughter on multiple occasions that I do not think he would be a good dialysis candidate   Valvular heart disease,  moderate to severe MR, LVH Pulmonary hypertension -Volume status improving, diuretics as above   Hypertension -Resumed amlodipine, Cozaar and hydralazine discontinued, blood pressure improved after diuresis   Hypokalemia -Replaced  CODE STATUS: DNR, after discussion with patient and his daughter    Consultants:  Gastroenterology   Procedures: EGD noted gastric polyp which was removed    Discharge Exam: Vitals:   05/07/21 0354 05/07/21 1121  BP: 126/73 125/79  Pulse: 65 81  Resp: 18 18  Temp: 98.5 F (36.9 C)   SpO2: 95% 97%   General exam: Pleasant elderly male sitting up in bed, AAO x2, no distress CVS: S1-S2, regular rate rhythm Lungs: Decreased breath sounds the bases Abdomen: Soft, nontender, bowel sounds present Extremities: Trace edema only  Skin: No rashes on exposed skin Psych: Appropriate mood and affect    Discharge Instructions   Discharge Instructions     Diet - low sodium heart healthy   Complete by: As directed    Increase activity slowly   Complete by: As directed       Allergies as of 05/07/2021  Reactions   Lisinopril    cough   Nsaids    Elevated Cr        Medication List     STOP taking these medications    hydrALAZINE 25 MG tablet Commonly known as: APRESOLINE   losartan 100 MG tablet Commonly known as: COZAAR    metoprolol succinate 50 MG 24 hr tablet Commonly known as: TOPROL-XL       TAKE these medications    amLODipine 10 MG tablet Commonly known as: NORVASC Take 10 mg by mouth daily. What changed: Another medication with the same name was removed. Continue taking this medication, and follow the directions you see here.   atorvastatin 10 MG tablet Commonly known as: LIPITOR Take 1 tablet (10 mg total) by mouth at bedtime.   FeroSul 325 (65 FE) MG tablet Generic drug: ferrous sulfate Take 1 tablet (325 mg total) by mouth 2 (two) times daily with a meal.   furosemide 40 MG tablet Commonly known as: LASIX Take 1 tablet (40 mg total) by mouth daily.   latanoprost 0.005 % ophthalmic solution Commonly known as: XALATAN Place 1 drop into both eyes at bedtime.   omeprazole 20 MG tablet Commonly known as: PRILOSEC OTC Take 1 tablet (20 mg total) by mouth daily.   Senexon-S 8.6-50 MG tablet Generic drug: senna-docusate Take 1 tablet by mouth at bedtime as needed for mild constipation.   sodium bicarbonate 650 MG tablet Take 1,250 mg by mouth See admin instructions. 2 tabs bid x 8 days       Allergies  Allergen Reactions   Lisinopril     cough   Nsaids     Elevated Cr    Follow-up Information     Gean Quint, MD. Go in 2 week(s).   Specialty: Nephrology Contact information: [place] [address] [phone]         Tonia Ghent, MD. Go on 05/09/2021.   Specialty: Family Medicine Why: @11 :30am please go Therapist, music at [employer] [place]. Contact information: [place] [address] [phone]                  The results of significant diagnostics from this hospitalization (including imaging, microbiology, ancillary and laboratory) are listed below for reference.    Significant Diagnostic Studies: US Abdomen Complete  Result Date: 05/03/2021 CLINICAL DATA:  Abdominal  distension. EXAM: ABDOMEN ULTRASOUND COMPLETE COMPARISON:  11/30/2020. FINDINGS: Gallbladder: There is a suspected stone in the gallbladder. No wall thickening or pericholecystic edema. The sonographer reports a negative Murphy sign. Common bile duct: Diameter: 2.3 mm Liver: No focal lesion identified. Within normal limits in parenchymal echogenicity. Portal vein is patent on color Doppler imaging with normal direction of blood flow towards the liver. IVC: Not well seen on exam Pancreas: Not well seen on exam Spleen: Size and appearance within normal limits. Right Kidney: Renal parenchyma is not visualized on exam. There is a cystic structure in the right renal fossa measuring 2.4 x 1.8 cm. Left Kidney: Renal parenchyma is not visualized on exam. There is a cystic structure in the left renal fossa measuring 2.5 x 1.4 cm. Calcification is noted in the region of the left renal pelvis measuring approximately 5 cm. Abdominal aorta: No aneurysm visualized. Other findings: No free fluid. IMPRESSION: 1. No evidence of renal parenchyma bilaterally with large cystic structures in the renal fossae, unchanged from prior exams. Calcification is noted in the region  of the left renal pelvis. 2. Cholelithiasis without acute cholecystitis. Electronically Signed   By: Brett Fairy M.D.   On: 05/03/2021 02:36    Microbiology: Recent Results (from the past 240 hour(s))  Resp Panel by RT-PCR (Flu A&B, Covid) Nasopharyngeal Swab     Status: None   Collection Time: 05/01/21  9:37 PM   Specimen: Nasopharyngeal Swab; Nasopharyngeal(NP) swabs in vial transport medium  Result Value Ref Range Status   SARS Coronavirus 2 by RT PCR NEGATIVE NEGATIVE Final    Comment: (NOTE) SARS-CoV-2 target nucleic acids are NOT DETECTED.  The SARS-CoV-2 RNA is generally detectable in upper respiratory specimens during the acute phase of infection. The lowest concentration of SARS-CoV-2 viral copies this assay can detect is 138 copies/mL. A  negative result does not preclude SARS-Cov-2 infection and should not be used as the sole basis for treatment or other patient management decisions. A negative result may occur with  improper specimen collection/handling, submission of specimen other than nasopharyngeal swab, presence of viral mutation(s) within the areas targeted by this assay, and inadequate number of viral copies(<138 copies/mL). A negative result must be combined with clinical observations, patient history, and epidemiological information. The expected result is Negative.  Fact Sheet for Patients:  EntrepreneurPulse.com.au  Fact Sheet for Healthcare Providers:  IncredibleEmployment.be  This test is no t yet approved or cleared by the [place] FDA and  has been authorized for detection and/or diagnosis of SARS-CoV-2 by FDA under an Emergency Use Authorization (EUA). This EUA will remain  in effect (meaning this test can be used) for the duration of the COVID-19 declaration under Section 564(b)(1) of the Act, 21 U.S.C.section 360bbb-3(b)(1), unless the authorization is terminated  or revoked sooner.       Influenza A by PCR NEGATIVE NEGATIVE Final   Influenza B by PCR NEGATIVE NEGATIVE Final    Comment: (NOTE) The Xpert Xpress SARS-CoV-2/FLU/RSV plus assay is intended as an aid in the diagnosis of influenza from Nasopharyngeal swab specimens and should not be used as a sole basis for treatment. Nasal washings and aspirates are unacceptable for Xpert Xpress SARS-CoV-2/FLU/RSV testing.  Fact Sheet for Patients: EntrepreneurPulse.com.au  Fact Sheet for Healthcare Providers: IncredibleEmployment.be  This test is not yet approved or cleared by the [place] FDA and has been authorized for detection and/or diagnosis of SARS-CoV-2 by FDA under an Emergency Use Authorization (EUA). This EUA will remain in effect (meaning this test can  be used) for the duration of the COVID-19 declaration under Section 564(b)(1) of the Act, 21 U.S.C. section 360bbb-3(b)(1), unless the authorization is terminated or revoked.  Performed at [facility], [place] [address], [place], [place] [address]      Labs: Basic Metabolic Panel: Recent Labs  Lab 05/04/21 0314 05/05/21 0242 05/06/21 0301 05/06/21 1118 05/07/21 0106  NA 126* 128* 124* 127* 128*  K 3.6 3.8 3.3* 3.4* 4.1  CL 95* 98 93* 95* 98  CO2 24 23 21* 23 20*  GLUCOSE 84 96 81 118* 81  BUN 25* 26* 24* 23 23  CREATININE 3.21* 3.07* 2.79* 2.79* 2.76*  CALCIUM 7.6* 7.6* 7.6* 7.9* 8.0*   Liver Function Tests: Recent Labs  Lab 05/06/21 0301  AST 20  ALT 11  ALKPHOS 46  BILITOT 0.9  PROT 6.4*  ALBUMIN 2.1*   No results for input(s): LIPASE, AMYLASE in the last 168 hours. No results for input(s): AMMONIA in the last 168 hours. CBC: Recent Labs  Lab 05/01/21 2017 05/02/21  0234 05/04/21 0314 05/05/21 0242 05/06/21 0301 05/06/21 1118 05/07/21 0106  WBC 6.9   < > 6.8 7.5 8.6 9.3 10.3  NEUTROABS 5.3  --   --   --   --   --   --   HGB 6.4*   < > 8.1* 7.2* 9.2* 9.7* 9.1*  HCT 18.9*   < > 23.5* 22.0* 26.3* 28.1* 27.3*  MCV 93.6   < > 89.0 93.2 88.9 89.8 90.7  PLT 281   < > 234 226 226 234 252   < > = values in this interval not displayed.   Cardiac Enzymes: No results for input(s): CKTOTAL, CKMB, CKMBINDEX, TROPONINI in the last 168 hours. BNP: BNP (last 3 results) Recent Labs    11/30/20 1107 05/02/21 0234  BNP 2,320.4* 318.9*    ProBNP (last 3 results) No results for input(s): PROBNP in the last 8760 hours.  CBG: No results for input(s): GLUCAP in the last 168 hours.     Signed:  Domenic Polite MD.  [facility] 05/07/2021, 2:00 PM

## 2021-05-07 NOTE — Plan of Care (Signed)
  Problem: Education: Goal: Knowledge of General Education information will improve Description: Including pain rating scale, medication(s)/side effects and non-pharmacologic comfort measures Outcome: Adequate for Discharge   Problem: Health Behavior/Discharge Planning: Goal: Ability to manage health-related needs will improve Outcome: Adequate for Discharge   Problem: Clinical Measurements: Goal: Will remain free from infection Outcome: Adequate for Discharge   Problem: Clinical Measurements: Goal: Diagnostic test results will improve Outcome: Adequate for Discharge

## 2021-05-07 NOTE — Progress Notes (Signed)
Patient discharged home. IVs and telemetry removed. Discharge instructions explained and given to family.

## 2021-05-08 ENCOUNTER — Telehealth: Payer: Self-pay

## 2021-05-08 NOTE — Telephone Encounter (Signed)
Transition Care Management Unsuccessful Follow-up Telephone Call  Date of discharge and from where:  05/07/21 from [facility]   Attempts:  1st Attempt  Reason for unsuccessful TCM follow-up call:  Left voice message    

## 2021-05-09 ENCOUNTER — Other Ambulatory Visit: Payer: Self-pay

## 2021-05-09 ENCOUNTER — Ambulatory Visit: Payer: Medicare PPO | Admitting: Family Medicine

## 2021-05-09 ENCOUNTER — Encounter: Payer: Self-pay | Admitting: Family Medicine

## 2021-05-09 DIAGNOSIS — Q613 Polycystic kidney, unspecified: Secondary | ICD-10-CM | POA: Diagnosis not present

## 2021-05-09 DIAGNOSIS — N184 Chronic kidney disease, stage 4 (severe): Secondary | ICD-10-CM | POA: Diagnosis not present

## 2021-05-09 DIAGNOSIS — I129 Hypertensive chronic kidney disease with stage 1 through stage 4 chronic kidney disease, or unspecified chronic kidney disease: Secondary | ICD-10-CM | POA: Diagnosis not present

## 2021-05-09 DIAGNOSIS — N2581 Secondary hyperparathyroidism of renal origin: Secondary | ICD-10-CM | POA: Diagnosis not present

## 2021-05-09 DIAGNOSIS — E871 Hypo-osmolality and hyponatremia: Secondary | ICD-10-CM | POA: Diagnosis not present

## 2021-05-09 DIAGNOSIS — D509 Iron deficiency anemia, unspecified: Secondary | ICD-10-CM

## 2021-05-09 DIAGNOSIS — D631 Anemia in chronic kidney disease: Secondary | ICD-10-CM | POA: Diagnosis not present

## 2021-05-09 DIAGNOSIS — K922 Gastrointestinal hemorrhage, unspecified: Secondary | ICD-10-CM | POA: Diagnosis not present

## 2021-05-09 MED ORDER — BETIMOL 0.25 % OP SOLN: 1.0000 [drp] | Freq: Every day | OPHTHALMIC | Status: DC

## 2021-05-09 MED ORDER — SODIUM BICARBONATE 650 MG PO TABS: 1250.0000 mg | ORAL_TABLET | Freq: Two times a day (BID) | ORAL | Status: DC

## 2021-05-09 NOTE — Progress Notes (Signed)
This visit occurred during the SARS-CoV-2 public health emergency.  Safety protocols were in place, including screening questions prior to the visit, additional usage of staff PPE, and extensive cleaning of exam room while observing appropriate contact time as indicated for disinfecting solutions.  Inpatient f/u.  Inpatient course and rationale for treatment discussed with patient.  Admitted with anemia.  He was having black stools prior to admission.  This is improving in the meantime.  No abdominal pain.  He had endoscopy done. No malignancy on biopsy.  No fevers.  No chills.  Compliant with current medications.  Med list updated and reviewed with patient and wife.  See below.  He went to see Dr. Candiss Norse today but couldn't get labs done.  He is going to follow-up next week for labs.  He has routine follow-up with the renal clinic.  Meds, vitals, and allergies reviewed.   ROS: Per HPI unless specifically indicated in ROS section   Current Outpatient Medications on File Prior to Visit  Medication Sig Dispense Refill   amLODipine (NORVASC) 10 MG tablet Take 10 mg by mouth daily.     atorvastatin (LIPITOR) 10 MG tablet Take 1 tablet (10 mg total) by mouth at bedtime. 90 tablet 3   ferrous sulfate 325 (65 FE) MG tablet Take 1 tablet (325 mg total) by mouth 2 (two) times daily with a meal. 60 tablet 0   furosemide (LASIX) 40 MG tablet Take 1 tablet (40 mg total) by mouth daily. 30 tablet 0   latanoprost (XALATAN) 0.005 % ophthalmic solution Place 1 drop into both eyes at bedtime.     omeprazole (PRILOSEC OTC) 20 MG tablet Take 1 tablet (20 mg total) by mouth daily.     senna-docusate (SENOKOT-S) 8.6-50 MG tablet Take 1 tablet by mouth at bedtime as needed for mild constipation. 10 tablet 0   No current facility-administered medications on file prior to visit.   GEN: nad, alert and oriented HEENT: ncat NECK: supple w/o LA CV: rrr PULM: ctab, no inc wob ABD: soft, +bs EXT: no edema SKIN: no acute  rash

## 2021-05-09 NOTE — Patient Instructions (Signed)
Please have Dr. Candiss Norse send me a copy of the labs from next week.  Please let me know if you can't get the labs drawn.  Please update me as needed.  Take care.  Glad to see you.

## 2021-05-12 NOTE — Assessment & Plan Note (Signed)
He is going to follow-up with Dr. Candiss Norse next week and I asked him to get me a copy of his labs/let me know if he could not get labs done.  No change in medications at this point.  Continue amlodipine Lipitor iron and Lasix.

## 2021-05-12 NOTE — Assessment & Plan Note (Signed)
Continue iron for now.  He is going to get follow-up labs done with nephrology/let me know if he is unable to get labs collected.  He is not having black stools now.  Continue PPI and update me as needed.  He agrees with plan.  30 minutes were devoted to patient care in this encounter (this includes time spent reviewing the patient's file/history, interviewing and examining the patient, counseling/reviewing plan with patient).

## 2021-05-14 DIAGNOSIS — N184 Chronic kidney disease, stage 4 (severe): Secondary | ICD-10-CM | POA: Diagnosis not present

## 2021-05-21 DIAGNOSIS — Z8546 Personal history of malignant neoplasm of prostate: Secondary | ICD-10-CM | POA: Diagnosis not present

## 2021-05-21 DIAGNOSIS — N200 Calculus of kidney: Secondary | ICD-10-CM | POA: Diagnosis not present

## 2021-05-21 DIAGNOSIS — Q613 Polycystic kidney, unspecified: Secondary | ICD-10-CM | POA: Diagnosis not present

## 2021-06-17 ENCOUNTER — Other Ambulatory Visit: Payer: Self-pay | Admitting: Family Medicine

## 2021-06-21 ENCOUNTER — Ambulatory Visit: Payer: Medicare PPO | Admitting: Gastroenterology

## 2021-07-29 ENCOUNTER — Other Ambulatory Visit: Payer: Self-pay | Admitting: Family Medicine

## 2021-08-14 ENCOUNTER — Telehealth: Payer: Self-pay

## 2021-08-14 NOTE — Telephone Encounter (Signed)
[name] - Client ?Nonclinical Telephone Record  ?Access[facility]urse? ?Client [name] - Client ?Client Site [facility] ?Provider Renford Dills - MD ?Contact Type Call ?Who Is Calling Patient / Member / Family / Caregiver ?Caller Name Roxy Files ?Caller Phone Number [phone] ?Patient Name Rick [name] ?Patient DOB 1929-04-30 ?Call Type Message Only Information Provided ?Reason for Call Medication Question / Request ?Initial Comment Caller states her husband's refill for Metoprolol was denied according to the pharmacy. He is ?completely out but is not experiencing symptoms. She would like to know why it was denied. ?Additional Comment Pharmacy- [facility] Office hours provided. ?Disp. Time Disposition Final User ?08/14/2021 1:13:50 PM General Information Provided Yes Caprice Beaver ?Call Closed By: Caprice Beaver ?Transaction Date/Time: 08/14/2021 1:09:50 PM (ET ?

## 2021-08-14 NOTE — Telephone Encounter (Signed)
Rx was denied due to being taken off of the medication when he was in the hospital in nov 2022. I called patients wife and explained that he was supposed to be off the medication. Wife states the pharmacy kept filling it but they will stay off the med if that's what he needs to do. I advised patients wife to monitor his BP and if he has changes to let us know that we may need to put him back on the med. She verbalized understanding.  ?

## 2021-08-18 NOTE — Progress Notes (Signed)
? ?Cardiology Office Note ? ? ?Date:  08/19/2021  ? ?ID:  Rick [name], DOB August 06, 1928, MRN [id number] ? ?PCP:  Tonia Ghent, MD  ?Cardiologist:   Dorris Carnes, MD  ? ?Pt presents for f/u of HTN and diastolic CHF   ? ?  ?History of Present Illness: ?Rick [name] is a 88 y.o. male with a history of HTN, CKD.   I saw him in June 2022 when he was hospitalized for  severe HTN and pulmonary edema  Echo was doe showing LVEF normal  and severe  eccentric MR    The pt was diuresed with IV lasix     No LE edema prior to d/c   Given age, renal dysfuciton plan was to treat medically and not pursue any further imaging ? ?Since seen the pt says he has been doig OK   He says his breathing is OK   He denies CP   No PND   Notes occasional dizziness   No syncope  No paliptation   ? ? ? ? ? ? ?Current Meds  ?Medication Sig  ? atorvastatin (LIPITOR) 10 MG tablet Take 1 tablet (10 mg total) by mouth at bedtime.  ? ferrous sulfate 325 (65 FE) MG tablet Take 1 tablet (325 mg total) by mouth 2 (two) times daily with a meal.  ? furosemide (LASIX) 40 MG tablet Take 1 tablet (40 mg total) by mouth daily.  ? latanoprost (XALATAN) 0.005 % ophthalmic solution Place 1 drop into both eyes at bedtime.  ? omeprazole (PRILOSEC OTC) 20 MG tablet Take 1 tablet (20 mg total) by mouth daily.  ? senna-docusate (SENOKOT-S) 8.6-50 MG tablet Take 1 tablet by mouth at bedtime as needed for mild constipation.  ? sodium bicarbonate 650 MG tablet Take 2 tablets (1,300 mg total) by mouth 2 (two) times daily. For 8 days  ? timolol (BETIMOL) 0.25 % ophthalmic solution Place 1 drop into both eyes daily.  ? ? ? ?Allergies:   Lisinopril and Nsaids  ? ?Past Medical History:  ?Diagnosis Date  ? Arthritis   ? GERD (gastroesophageal reflux disease)   ? History of colon polyps   ? History of prostate cancer   ? Hyperlipidemia   ? Hypertension   ? Kidney cysts   ? bilateral.  Renal US  ? Mitral regurgitation 12/02/2020  ? ? ?Past Surgical History:  ?Procedure  Laterality Date  ? BIOPSY  05/05/2021  ? Procedure: BIOPSY;  Surgeon: Thornton Park, MD;  Location: [name];  Service: Gastroenterology;;  ? COLONOSCOPY    ? CYSTOSCOPY  02/12/99  ? biopsy  ? ESOPHAGEAL BANDING N/A 08/04/2012  ? Procedure: ESOPHAGEAL BANDING;  Surgeon: Inda Castle, MD;  Location: [facility];  Service: Endoscopy;  Laterality: N/A;  ? ESOPHAGOGASTRODUODENOSCOPY N/A 08/04/2012  ? Procedure: ESOPHAGOGASTRODUODENOSCOPY (EGD);  Surgeon: Inda Castle, MD;  Location: [facility];  Service: Endoscopy;  Laterality: N/A;  ? ESOPHAGOGASTRODUODENOSCOPY (EGD) WITH PROPOFOL N/A 05/05/2021  ? Procedure: ESOPHAGOGASTRODUODENOSCOPY (EGD) WITH PROPOFOL;  Surgeon: Thornton Park, MD;  Location: [facility];  Service: Gastroenterology;  Laterality: N/A;  ? INGUINAL HERNIA REPAIR  02/1999  ? Dr. Reece Agar  ? POLYPECTOMY  05/05/2021  ? Procedure: POLYPECTOMY;  Surgeon: Thornton Park, MD;  Location: [facility];  Service: Gastroenterology;;  ? PROSTATECTOMY  02/1999  ? ? ? ?Social History:  The patient  reports that he quit smoking about 19 years ago. His smoking use included cigarettes. His smokeless tobacco use includes chew. He reports  current alcohol use. He reports that he does not use drugs.  ? ?Family History:  The patient's family history includes Bone cancer in his sister; Colon cancer in his sister and another family member; Lung cancer in his sister; Stomach cancer in his brother and brother; Stroke in his mother.  ? ? ?ROS:  Please see the history of present illness. All other systems are reviewed and  Negative to the above problem except as noted.  ? ? ?PHYSICAL EXAM: ?VS:  BP 128/80   Pulse 71   Ht '5\' 8"'$  (1.727 m)   Wt 151 lb (68.5 kg)   BMI 22.96 kg/m?   ?GEN: Well nourished, well developed, in no acute distress  ?HEENT: normal  ?Neck: no JVD, carotid bruits ?Cardiac: RRR; Gr II/VI systolc murmur LSB to apex   Tr to 1+ LE edema  ?Respiratory:  clear to auscultation bilaterally, ?GI:  soft, nontender, nondistended, + BS  No hepatomegaly  ?MS: no deformity Moving all extremities   ?Skin: warm and dry, no rash ?Neuro:  Strength and sensation are intact ?Psych: euthymic mood, full affect ? ? ?EKG:  EKG is ordered today.  SR 71 bpm   First degree AV block  PR 302 msec   RBBB   LAFB    ? ? ?Lipid Panel ?   ?Component Value Date/Time  ? CHOL 132 05/18/2020 1218  ? TRIG 73.0 05/18/2020 1218  ? HDL 61.60 05/18/2020 1218  ? CHOLHDL 2 05/18/2020 1218  ? VLDL 14.6 05/18/2020 1218  ? [place] 56 05/18/2020 1218  ? ?  ? ?Wt Readings from Last 3 Encounters:  ?08/19/21 151 lb (68.5 kg)  ?05/09/21 144 lb (65.3 kg)  ?05/07/21 139 lb 1.8 oz (63.1 kg)  ?  ? ? ?ASSESSMENT AND PLAN: ? ?1  HTN  BP is contrlled    He has not been on amlodipine for awhile  Would not resume   Asked pt to follow BP at home  ? ?2  Hx diastolic CHF   Volume is ot too bad on exam   Follow  ? ?3  Mitral regurgitation  PT with mod to severe MR by echo in June 2022   Plan for medical Rx  Overall appears to be tolerating   ? ?Will get CBC, CMET and lipid  ? ?F/U in Oct 2023 ? ? ?Current medicines are reviewed at length with the patient today.  The patient does not have concerns regarding medicines. ? ?Signed, ?Dorris Carnes, MD  ?08/19/2021 11:50 PM    ?[address], [place], [place]  [address] ?Phone: [phone]; Fax: [phone]  ? ? ?

## 2021-08-19 ENCOUNTER — Ambulatory Visit: Payer: Medicare PPO | Admitting: Internal Medicine

## 2021-08-19 ENCOUNTER — Other Ambulatory Visit: Payer: Self-pay

## 2021-08-19 ENCOUNTER — Encounter: Payer: Self-pay | Admitting: Internal Medicine

## 2021-08-19 VITALS — BP 128/80 | HR 71 | Ht 68.0 in | Wt 151.0 lb

## 2021-08-19 DIAGNOSIS — I10 Essential (primary) hypertension: Secondary | ICD-10-CM | POA: Diagnosis not present

## 2021-08-19 DIAGNOSIS — Z79899 Other long term (current) drug therapy: Secondary | ICD-10-CM

## 2021-08-19 DIAGNOSIS — I5033 Acute on chronic diastolic (congestive) heart failure: Secondary | ICD-10-CM | POA: Diagnosis not present

## 2021-08-19 DIAGNOSIS — Z0000 Encounter for general adult medical examination without abnormal findings: Secondary | ICD-10-CM

## 2021-08-19 NOTE — Patient Instructions (Signed)
Medication Instructions:  ?*If you need a refill on your cardiac medications before your next appointment, please call your pharmacy* ? ? ?Lab Work: ? CMET, CBC, TSH, LIPID ?If you have labs (blood work) drawn today and your tests are completely normal, you will receive your results only by: ?MyChart Message (if you have MyChart) OR ?A paper copy in the mail ?If you have any lab test that is abnormal or we need to change your treatment, we will call you to review the results. ? ? ?Testing/Procedures: ?NONE ? ? ?Follow-Up: ?At [facility], you and your health needs are our priority.  As part of our continuing mission to provide you with exceptional heart care, we have created designated Provider Care Teams.  These Care Teams include your primary Cardiologist (physician) and Advanced Practice Providers (APPs -  Physician Assistants and Nurse Practitioners) who all work together to provide you with the care you need, when you need it. ? ?We recommend signing up for the patient portal called "MyChart".  Sign up information is provided on this After Visit Summary.  MyChart is used to connect with patients for Virtual Visits (Telemedicine).  Patients are able to view lab/test results, encounter notes, upcoming appointments, etc.  Non-urgent messages can be sent to your provider as well.   ?To learn more about what you can do with MyChart, go to [email or web address].   ? ?Your next appointment:   ?7 month(s) ? ?The format for your next appointment:   ?In Person ? ?Provider:  Dr. Dorris Carnes  ?

## 2021-08-20 LAB — COMPREHENSIVE METABOLIC PANEL
ALT: 6 IU/L (ref 0–44)
AST: 15 IU/L (ref 0–40)
Albumin/Globulin Ratio: 0.6 — ABNORMAL LOW (ref 1.2–2.2)
Albumin: 3 g/dL — ABNORMAL LOW (ref 3.5–4.6)
Alkaline Phosphatase: 81 IU/L (ref 44–121)
BUN/Creatinine Ratio: 8 — ABNORMAL LOW (ref 10–24)
BUN: 20 mg/dL (ref 10–36)
Bilirubin Total: 0.4 mg/dL (ref 0.0–1.2)
CO2: 24 mmol/L (ref 20–29)
Calcium: 8.6 mg/dL (ref 8.6–10.2)
Chloride: 94 mmol/L — ABNORMAL LOW (ref 96–106)
Creatinine, Ser: 2.54 mg/dL — ABNORMAL HIGH (ref 0.76–1.27)
Globulin, Total: 5.2 g/dL — ABNORMAL HIGH (ref 1.5–4.5)
Glucose: 79 mg/dL (ref 70–99)
Potassium: 4.9 mmol/L (ref 3.5–5.2)
Sodium: 128 mmol/L — ABNORMAL LOW (ref 134–144)
Total Protein: 8.2 g/dL (ref 6.0–8.5)
eGFR: 23 mL/min/{1.73_m2} — ABNORMAL LOW (ref >59)

## 2021-08-20 LAB — LIPID PANEL
Chol/HDL Ratio: 1.9 ratio (ref 0.0–5.0)
Cholesterol, Total: 101 mg/dL (ref 100–199)
HDL: 52 mg/dL (ref >39)
LDL Chol Calc (NIH): 38 mg/dL (ref 0–99)
Triglycerides: 43 mg/dL (ref 0–149)
VLDL Cholesterol Cal: 11 mg/dL (ref 5–40)

## 2021-08-20 LAB — CBC
Hematocrit: 28 % — ABNORMAL LOW (ref 37.5–51.0)
Hemoglobin: 9.2 g/dL — ABNORMAL LOW (ref 13.0–17.7)
MCH: 28.2 pg (ref 26.6–33.0)
MCHC: 32.9 g/dL (ref 31.5–35.7)
MCV: 86 fL (ref 79–97)
Platelets: 432 10*3/uL (ref 150–450)
RBC: 3.26 x10E6/uL — ABNORMAL LOW (ref 4.14–5.80)
RDW: 13.1 % (ref 11.6–15.4)
WBC: 7.3 10*3/uL (ref 3.4–10.8)

## 2021-08-20 LAB — TSH: TSH: 9.52 u[IU]/mL — ABNORMAL HIGH (ref 0.450–4.500)

## 2021-08-26 NOTE — Addendum Note (Signed)
Addended by: Janan Halter F on: 08/26/2021 04:38 PM ? ? Modules accepted: Orders ? ?

## 2021-08-28 ENCOUNTER — Telehealth: Payer: Self-pay

## 2021-08-28 DIAGNOSIS — E039 Hypothyroidism, unspecified: Secondary | ICD-10-CM

## 2021-08-28 DIAGNOSIS — Z79899 Other long term (current) drug therapy: Secondary | ICD-10-CM

## 2021-08-28 NOTE — Telephone Encounter (Signed)
Pt to have labs drawn 09/04/21.  ?

## 2021-08-28 NOTE — Telephone Encounter (Signed)
-----   Message from Fay Records, MD sent at 08/28/2021  1:29 PM EDT ----- ?CBC:  Hgb relatively stable at 9.2    ?Cr 2.54   Mldly improved from Nov 2022 ?Lipids are excellent    ?Thryoid may be oof   Would   Check free T3 , free T4 at his convenience   ?

## 2021-08-29 ENCOUNTER — Telehealth: Payer: Self-pay | Admitting: Internal Medicine

## 2021-08-29 NOTE — Telephone Encounter (Signed)
Wife calling in with questions bout the labs results. Please advise ?

## 2021-08-29 NOTE — Telephone Encounter (Signed)
Called spouse ok per DPR reviewed lab results and allowed time to write down.  Spouse thanked me for call had no further questions or concerns.   ?

## 2021-09-04 ENCOUNTER — Other Ambulatory Visit: Payer: Self-pay

## 2021-09-04 ENCOUNTER — Other Ambulatory Visit: Payer: Medicare PPO | Admitting: *Deleted

## 2021-09-04 DIAGNOSIS — E039 Hypothyroidism, unspecified: Secondary | ICD-10-CM

## 2021-09-04 DIAGNOSIS — Z79899 Other long term (current) drug therapy: Secondary | ICD-10-CM | POA: Diagnosis not present

## 2021-09-05 LAB — T3, FREE: T3, Free: 1.8 pg/mL — ABNORMAL LOW (ref 2.0–4.4)

## 2021-09-05 LAB — T4, FREE: Free T4: 1.03 ng/dL (ref 0.82–1.77)

## 2021-09-09 ENCOUNTER — Telehealth: Payer: Self-pay | Admitting: Internal Medicine

## 2021-09-09 DIAGNOSIS — E039 Hypothyroidism, unspecified: Secondary | ICD-10-CM

## 2021-09-09 NOTE — Telephone Encounter (Signed)
Left a message to call back.

## 2021-09-09 NOTE — Telephone Encounter (Signed)
Follow Up: ? ? ? ?Patient's wife is returning a call, concerning his lab results. ?

## 2021-09-10 NOTE — Telephone Encounter (Signed)
Advised wife of abnormal thyroid test and advised Dr. Josefine Class office would be calling with medication recommendations. ? ?Wife indicates understanding. ?

## 2021-09-10 NOTE — Telephone Encounter (Signed)
I copied the result message here.  Okay to address either message.  Thanks.  ? ?Please contact patient.  His thyroid function is not as good as it used to be and it likely makes sense to start thyroid medication (levothyroxine), taking 1 pill daily.  He would need to take it in the morning on empty stomach.  The medication is usually well-tolerated.   ?If he starts the medication, he would need follow-up labs in about 2 months.  Please let me know if he is willing to start so I can send the prescription and put in the follow-up labs.  Thanks. ?

## 2021-09-10 NOTE — Telephone Encounter (Signed)
Pt's wife returning call.

## 2021-09-10 NOTE — Telephone Encounter (Signed)
LMTCB

## 2021-09-11 MED ORDER — LEVOTHYROXINE SODIUM 25 MCG PO TABS: 25.0000 ug | ORAL_TABLET | Freq: Every day | ORAL | 3 refills | Status: DC

## 2021-09-11 NOTE — Telephone Encounter (Signed)
Called patient wife reviewed all information and repeated back to me. Will call if any questions.  ? ?Patient would like to start medication lab appointment made.  ? ?

## 2021-09-11 NOTE — Telephone Encounter (Signed)
I sent the rx and put in the lab order.  Thanks.  ?

## 2021-10-14 ENCOUNTER — Telehealth: Payer: Self-pay | Admitting: Family Medicine

## 2021-10-14 NOTE — Telephone Encounter (Signed)
Left message for patient to call back and schedule Medicare Annual Wellness Visit (AWV) to be completed by video or phone. ? ? ? ?Last AWV: 11/11/2018 ? ? ? ?Please schedule at anytime with  ?[facility]    ? ? ? ?45 minute appointment ? ? ? ?Any questions, please contact me at [phone] ?

## 2021-10-21 ENCOUNTER — Ambulatory Visit: Payer: Medicare PPO

## 2021-10-22 ENCOUNTER — Emergency Department (HOSPITAL_COMMUNITY): Payer: Medicare PPO

## 2021-10-22 ENCOUNTER — Other Ambulatory Visit: Payer: Self-pay

## 2021-10-22 ENCOUNTER — Inpatient Hospital Stay (HOSPITAL_COMMUNITY)
Admission: EM | Admit: 2021-10-22 | Discharge: 2021-11-01 | DRG: 536 | Disposition: A | Discharge status: Skilled Nursing Facility | Payer: Medicare PPO | Attending: Surgery | Admitting: Surgery

## 2021-10-22 ENCOUNTER — Encounter (HOSPITAL_COMMUNITY): Payer: Self-pay

## 2021-10-22 DIAGNOSIS — S61412A Laceration without foreign body of left hand, initial encounter: Secondary | ICD-10-CM | POA: Diagnosis present

## 2021-10-22 DIAGNOSIS — Z79890 Hormone replacement therapy (postmenopausal): Secondary | ICD-10-CM

## 2021-10-22 DIAGNOSIS — Z720 Tobacco use: Secondary | ICD-10-CM

## 2021-10-22 DIAGNOSIS — Z800 Family history of malignant neoplasm of digestive organs: Secondary | ICD-10-CM

## 2021-10-22 DIAGNOSIS — K219 Gastro-esophageal reflux disease without esophagitis: Secondary | ICD-10-CM | POA: Diagnosis present

## 2021-10-22 DIAGNOSIS — E871 Hypo-osmolality and hyponatremia: Secondary | ICD-10-CM | POA: Diagnosis present

## 2021-10-22 DIAGNOSIS — S299XXA Unspecified injury of thorax, initial encounter: Secondary | ICD-10-CM

## 2021-10-22 DIAGNOSIS — S32591A Other specified fracture of right pubis, initial encounter for closed fracture: Secondary | ICD-10-CM | POA: Diagnosis present

## 2021-10-22 DIAGNOSIS — S3282XA Multiple fractures of pelvis without disruption of pelvic ring, initial encounter for closed fracture: Secondary | ICD-10-CM | POA: Diagnosis not present

## 2021-10-22 DIAGNOSIS — M7989 Other specified soft tissue disorders: Secondary | ICD-10-CM | POA: Diagnosis not present

## 2021-10-22 DIAGNOSIS — S32592A Other specified fracture of left pubis, initial encounter for closed fracture: Secondary | ICD-10-CM | POA: Diagnosis not present

## 2021-10-22 DIAGNOSIS — S381XXA Crushing injury of abdomen, lower back, and pelvis, initial encounter: Secondary | ICD-10-CM | POA: Diagnosis not present

## 2021-10-22 DIAGNOSIS — Z823 Family history of stroke: Secondary | ICD-10-CM

## 2021-10-22 DIAGNOSIS — I129 Hypertensive chronic kidney disease with stage 1 through stage 4 chronic kidney disease, or unspecified chronic kidney disease: Secondary | ICD-10-CM | POA: Diagnosis present

## 2021-10-22 DIAGNOSIS — Z886 Allergy status to analgesic agent status: Secondary | ICD-10-CM

## 2021-10-22 DIAGNOSIS — S62307A Unspecified fracture of fifth metacarpal bone, left hand, initial encounter for closed fracture: Secondary | ICD-10-CM | POA: Diagnosis not present

## 2021-10-22 DIAGNOSIS — N184 Chronic kidney disease, stage 4 (severe): Secondary | ICD-10-CM

## 2021-10-22 DIAGNOSIS — S32810A Multiple fractures of pelvis with stable disruption of pelvic ring, initial encounter for closed fracture: Secondary | ICD-10-CM | POA: Diagnosis not present

## 2021-10-22 DIAGNOSIS — D649 Anemia, unspecified: Secondary | ICD-10-CM

## 2021-10-22 DIAGNOSIS — Z801 Family history of malignant neoplasm of trachea, bronchus and lung: Secondary | ICD-10-CM

## 2021-10-22 DIAGNOSIS — I700 Atherosclerosis of aorta: Secondary | ICD-10-CM | POA: Diagnosis not present

## 2021-10-22 DIAGNOSIS — S3289XA Fracture of other parts of pelvis, initial encounter for closed fracture: Secondary | ICD-10-CM | POA: Diagnosis not present

## 2021-10-22 DIAGNOSIS — N281 Cyst of kidney, acquired: Secondary | ICD-10-CM | POA: Diagnosis present

## 2021-10-22 DIAGNOSIS — W19XXXA Unspecified fall, initial encounter: Principal | ICD-10-CM

## 2021-10-22 DIAGNOSIS — E785 Hyperlipidemia, unspecified: Secondary | ICD-10-CM | POA: Diagnosis present

## 2021-10-22 DIAGNOSIS — R102 Pelvic and perineal pain: Secondary | ICD-10-CM | POA: Diagnosis not present

## 2021-10-22 DIAGNOSIS — Y929 Unspecified place or not applicable: Secondary | ICD-10-CM

## 2021-10-22 DIAGNOSIS — N189 Chronic kidney disease, unspecified: Secondary | ICD-10-CM | POA: Diagnosis present

## 2021-10-22 DIAGNOSIS — S32501A Unspecified fracture of right pubis, initial encounter for closed fracture: Secondary | ICD-10-CM | POA: Diagnosis present

## 2021-10-22 DIAGNOSIS — F17209 Nicotine dependence, unspecified, with unspecified nicotine-induced disorders: Secondary | ICD-10-CM | POA: Diagnosis not present

## 2021-10-22 DIAGNOSIS — Z888 Allergy status to other drugs, medicaments and biological substances status: Secondary | ICD-10-CM

## 2021-10-22 DIAGNOSIS — S3991XA Unspecified injury of abdomen, initial encounter: Secondary | ICD-10-CM

## 2021-10-22 DIAGNOSIS — E039 Hypothyroidism, unspecified: Secondary | ICD-10-CM | POA: Diagnosis present

## 2021-10-22 DIAGNOSIS — X58XXXA Exposure to other specified factors, initial encounter: Secondary | ICD-10-CM | POA: Diagnosis present

## 2021-10-22 DIAGNOSIS — S32502A Unspecified fracture of left pubis, initial encounter for closed fracture: Secondary | ICD-10-CM | POA: Diagnosis present

## 2021-10-22 DIAGNOSIS — I10 Essential (primary) hypertension: Secondary | ICD-10-CM | POA: Diagnosis not present

## 2021-10-22 DIAGNOSIS — Z8546 Personal history of malignant neoplasm of prostate: Secondary | ICD-10-CM

## 2021-10-22 LAB — CBC
HCT: 27 % — ABNORMAL LOW (ref 39.0–52.0)
Hemoglobin: 8.4 g/dL — ABNORMAL LOW (ref 13.0–17.0)
MCH: 27.4 pg (ref 26.0–34.0)
MCHC: 31.1 g/dL (ref 30.0–36.0)
MCV: 87.9 fL (ref 80.0–100.0)
Platelets: 275 10*3/uL (ref 150–400)
RBC: 3.07 MIL/uL — ABNORMAL LOW (ref 4.22–5.81)
RDW: 16.5 % — ABNORMAL HIGH (ref 11.5–15.5)
WBC: 8.4 10*3/uL (ref 4.0–10.5)
nRBC: 0 % (ref 0.0–0.2)

## 2021-10-22 LAB — COMPREHENSIVE METABOLIC PANEL
ALT: 11 U/L (ref 0–44)
AST: 15 U/L (ref 15–41)
Albumin: 1.9 g/dL — ABNORMAL LOW (ref 3.5–5.0)
Alkaline Phosphatase: 59 U/L (ref 38–126)
Anion gap: 7 (ref 5–15)
BUN: 18 mg/dL (ref 8–23)
CO2: 25 mmol/L (ref 22–32)
Calcium: 8 mg/dL — ABNORMAL LOW (ref 8.9–10.3)
Chloride: 102 mmol/L (ref 98–111)
Creatinine, Ser: 2.84 mg/dL — ABNORMAL HIGH (ref 0.61–1.24)
GFR, Estimated: 20 mL/min — ABNORMAL LOW (ref >60)
Glucose, Bld: 87 mg/dL (ref 70–99)
Potassium: 3.5 mmol/L (ref 3.5–5.1)
Sodium: 134 mmol/L — ABNORMAL LOW (ref 135–145)
Total Bilirubin: 0.4 mg/dL (ref 0.3–1.2)
Total Protein: 6.8 g/dL (ref 6.5–8.1)

## 2021-10-22 LAB — I-STAT CHEM 8, ED
BUN: 18 mg/dL (ref 8–23)
Calcium, Ion: 1.07 mmol/L — ABNORMAL LOW (ref 1.15–1.40)
Chloride: 99 mmol/L (ref 98–111)
Creatinine, Ser: 3 mg/dL — ABNORMAL HIGH (ref 0.61–1.24)
Glucose, Bld: 86 mg/dL (ref 70–99)
HCT: 27 % — ABNORMAL LOW (ref 39.0–52.0)
Hemoglobin: 9.2 g/dL — ABNORMAL LOW (ref 13.0–17.0)
Potassium: 3.6 mmol/L (ref 3.5–5.1)
Sodium: 135 mmol/L (ref 135–145)
TCO2: 25 mmol/L (ref 22–32)

## 2021-10-22 LAB — SAMPLE TO BLOOD BANK

## 2021-10-22 MED ORDER — ONDANSETRON 4 MG PO TBDP: 4.0000 mg | ORAL_TABLET | Freq: Four times a day (QID) | ORAL | Status: DC | PRN

## 2021-10-22 MED ORDER — POTASSIUM CL IN DEXTROSE 5% 20 MEQ/L IV SOLN
20.0000 meq | INTRAVENOUS | Status: DC
  Administered 2021-10-22 – 2021-10-23 (×2): 20 meq via INTRAVENOUS
  Filled 2021-10-22 (×2): qty 1000

## 2021-10-22 MED ORDER — MORPHINE SULFATE (PF) 2 MG/ML IV SOLN
2.0000 mg | Freq: Once | INTRAVENOUS | Status: AC
  Administered 2021-10-22: 2 mg via INTRAVENOUS
  Filled 2021-10-22: qty 1

## 2021-10-22 MED ORDER — DOCUSATE SODIUM 100 MG PO CAPS
100.0000 mg | ORAL_CAPSULE | Freq: Two times a day (BID) | ORAL | Status: DC
  Administered 2021-10-22 – 2021-11-01 (×16): 100 mg via ORAL
  Filled 2021-10-22 (×20): qty 1

## 2021-10-22 MED ORDER — MORPHINE SULFATE (PF) 2 MG/ML IV SOLN: 2.0000 mg | INTRAVENOUS | Status: DC | PRN

## 2021-10-22 MED ORDER — AMLODIPINE BESYLATE 10 MG PO TABS
10.0000 mg | ORAL_TABLET | Freq: Every day | ORAL | Status: DC
Start: 2021-10-23 — End: 2021-11-01
  Administered 2021-10-23 – 2021-11-01 (×10): 10 mg via ORAL
  Filled 2021-10-22 (×10): qty 1

## 2021-10-22 MED ORDER — BACITRACIN ZINC 500 UNIT/GM EX OINT
TOPICAL_OINTMENT | Freq: Two times a day (BID) | CUTANEOUS | Status: DC
  Administered 2021-10-22 – 2021-10-24 (×2): 1 via TOPICAL
  Filled 2021-10-22 (×4): qty 28.35
  Filled 2021-10-22: qty 0.9
  Filled 2021-10-22: qty 28.35

## 2021-10-22 MED ORDER — TIMOLOL MALEATE 0.5 % OP SOLN
1.0000 [drp] | Freq: Two times a day (BID) | OPHTHALMIC | Status: DC
  Administered 2021-10-22 – 2021-11-01 (×20): 1 [drp] via OPHTHALMIC
  Filled 2021-10-22 (×5): qty 5

## 2021-10-22 MED ORDER — PANTOPRAZOLE SODIUM 40 MG PO TBEC
40.0000 mg | DELAYED_RELEASE_TABLET | Freq: Every day | ORAL | Status: DC
Start: 2021-10-23 — End: 2021-11-01
  Administered 2021-10-23 – 2021-11-01 (×10): 40 mg via ORAL
  Filled 2021-10-22 (×10): qty 1

## 2021-10-22 MED ORDER — ONDANSETRON HCL 4 MG/2ML IJ SOLN: 4.0000 mg | Freq: Four times a day (QID) | INTRAMUSCULAR | Status: DC | PRN

## 2021-10-22 MED ORDER — LATANOPROST 0.005 % OP SOLN
1.0000 [drp] | Freq: Every day | OPHTHALMIC | Status: DC
  Administered 2021-10-22 – 2021-10-31 (×9): 1 [drp] via OPHTHALMIC
  Filled 2021-10-22: qty 2.5

## 2021-10-22 MED ORDER — OXYCODONE HCL 5 MG PO TABS
2.5000 mg | ORAL_TABLET | ORAL | Status: DC | PRN
  Administered 2021-10-23: 5 mg via ORAL
  Filled 2021-10-22: qty 1

## 2021-10-22 MED ORDER — LOSARTAN POTASSIUM 50 MG PO TABS
100.0000 mg | ORAL_TABLET | Freq: Every day | ORAL | Status: DC
  Administered 2021-10-23 – 2021-11-01 (×10): 100 mg via ORAL
  Filled 2021-10-22 (×10): qty 2

## 2021-10-22 MED ORDER — SENNOSIDES-DOCUSATE SODIUM 8.6-50 MG PO TABS
1.0000 | ORAL_TABLET | Freq: Every evening | ORAL | Status: DC | PRN
  Administered 2021-10-25: 1 via ORAL

## 2021-10-22 MED ORDER — FERROUS SULFATE 325 (65 FE) MG PO TABS
325.0000 mg | ORAL_TABLET | Freq: Every day | ORAL | Status: DC
  Administered 2021-10-23 – 2021-11-01 (×10): 325 mg via ORAL
  Filled 2021-10-22 (×10): qty 1

## 2021-10-22 MED ORDER — SODIUM BICARBONATE 650 MG PO TABS
1250.0000 mg | ORAL_TABLET | Freq: Two times a day (BID) | ORAL | Status: DC
  Administered 2021-10-22 – 2021-11-01 (×20): 1300 mg via ORAL
  Filled 2021-10-22 (×20): qty 2

## 2021-10-22 MED ORDER — ATORVASTATIN CALCIUM 10 MG PO TABS
10.0000 mg | ORAL_TABLET | Freq: Every day | ORAL | Status: DC
  Administered 2021-10-22 – 2021-10-31 (×10): 10 mg via ORAL
  Filled 2021-10-22 (×10): qty 1

## 2021-10-22 MED ORDER — HEPARIN SODIUM (PORCINE) 5000 UNIT/ML IJ SOLN
5000.0000 [IU] | Freq: Three times a day (TID) | INTRAMUSCULAR | Status: DC
  Administered 2021-10-23 – 2021-11-01 (×27): 5000 [IU] via SUBCUTANEOUS
  Filled 2021-10-22 (×26): qty 1

## 2021-10-22 MED ORDER — LEVOTHYROXINE SODIUM 25 MCG PO TABS
25.0000 ug | ORAL_TABLET | Freq: Every day | ORAL | Status: DC
  Administered 2021-10-23 – 2021-11-01 (×10): 25 ug via ORAL
  Filled 2021-10-22 (×10): qty 1

## 2021-10-22 MED ORDER — ACETAMINOPHEN 325 MG PO TABS
650.0000 mg | ORAL_TABLET | Freq: Four times a day (QID) | ORAL | Status: DC
  Administered 2021-10-22 – 2021-11-01 (×35): 650 mg via ORAL
  Filled 2021-10-22 (×35): qty 2

## 2021-10-22 NOTE — ED Triage Notes (Signed)
Pt arrives via EMS after pt was working on his tractor and it ran over him. Tractor ran over pelvis area. Pt has some distention to abd but family reports he has some distention at baseline. Skin tear to left hand. Initial BP for EMS 90/50 and pt given 150 cc NS.  ?

## 2021-10-22 NOTE — Progress Notes (Signed)
Orthopedic Tech Progress Note ?Patient Details:  ?Rick [name] ?04-13-1929 ?[id number] ? ?Level 2 trauma  ? ?Patient ID: Rick [name], male   DOB: 05-04-1929, 88 y.o.   MRN: [id number] ? ?[name] ?10/22/2021, 1:32 PM ? ?

## 2021-10-22 NOTE — Progress Notes (Signed)
?  10/22/21 1345  ?Clinical Encounter Type  ?Visited With Patient and family together;Health care provider  ?Visit Type Trauma;ED;Follow-up  ?Referral From Nurse  ?Consult/Referral To Chaplain ?(Frederick Sibben)  ? ?Met [name] and his wife Rick [name] at patient's bedside. ?Chaplain provided meaningful presence, hospitality, and emotional support to patient's wife. ?Chaplain Frederick Sibben, M. Min., [phone]. ?

## 2021-10-22 NOTE — ED Notes (Signed)
ED TO INPATIENT HANDOFF REPORT ? ?ED Nurse Name and Phone #: Mel Almond RN [phone] ? ?S ?Name/Age/Gender ?Rick [name] ?88 y.o. ?male ?Room/Bed: 044C/044C ? ?Code Status ?  Code Status: Full Code ? ?Home/SNF/Other ?Home ?Patient oriented to: self, place, time, and situation ?Is this baseline? Yes  ? ?Triage Complete: Triage complete  ?Chief Complaint ?Multiple closed pelvic fractures without disruption of pelvic ring ([place]) [S32.82XA] ? ?Triage Note ?Pt arrives via EMS after pt was working on his tractor and it ran over him. Tractor ran over pelvis area. Pt has some distention to abd but family reports he has some distention at baseline. Skin tear to left hand. Initial BP for EMS 90/50 and pt given 150 cc NS.   ? ?Allergies ?Allergies  ?Allergen Reactions  ? Lisinopril   ?  cough  ? Nsaids   ?  Elevated Cr  ? ? ?Level of Care/Admitting Diagnosis ?ED Disposition   ? ? ED Disposition  ?Admit  ? Condition  ?--  ? Comment  ?Hospital Area: [facility] [[id number]] ? Level of Care: Telemetry Medical [104] ? May place patient in observation at [facility] or [place] if equivalent level of care is available:: No ? Covid Evaluation: Asymptomatic - no recent exposure (last 10 days) testing not required ? Diagnosis: Multiple closed pelvic fractures without disruption of pelvic ring ([place]) [[id number]] ? Admitting Physician: TRAUMA MD [[id number]] ? Attending Physician: TRAUMA MD [[id number]] ? Bed request comments: 6N ?  ?  ? ?  ? ? ?B ?Medical/Surgery History ?Past Medical History:  ?Diagnosis Date  ? Arthritis   ? GERD (gastroesophageal reflux disease)   ? History of colon polyps   ? History of prostate cancer   ? Hyperlipidemia   ? Hypertension   ? Kidney cysts   ? bilateral.  Renal US  ? Mitral regurgitation 12/02/2020  ? ?Past Surgical History:  ?Procedure Laterality Date  ? BIOPSY  05/05/2021  ? Procedure: BIOPSY;  Surgeon: Thornton Park, MD;  Location: [facility];  Service: Gastroenterology;;  ? COLONOSCOPY    ?  CYSTOSCOPY  02/12/99  ? biopsy  ? ESOPHAGEAL BANDING N/A 08/04/2012  ? Procedure: ESOPHAGEAL BANDING;  Surgeon: Inda Castle, MD;  [facility]: [facility];  Service: Endoscopy;  Laterality: N/A;  ? ESOPHAGOGASTRODUODENOSCOPY N/A 08/04/2012  ? Procedure: ESOPHAGOGASTRODUODENOSCOPY (EGD);  Surgeon: Inda Castle, MD;  Location: [facility];  Service: Endoscopy;  Laterality: N/A;  ? ESOPHAGOGASTRODUODENOSCOPY (EGD) WITH PROPOFOL N/A 05/05/2021  ? Procedure: ESOPHAGOGASTRODUODENOSCOPY (EGD) WITH PROPOFOL;  Surgeon: Thornton Park, MD;  Location: [facility];  Service: Gastroenterology;  Laterality: N/A;  ? INGUINAL HERNIA REPAIR  02/1999  ? Dr. Reece Agar  ? POLYPECTOMY  05/05/2021  ? Procedure: POLYPECTOMY;  Surgeon: Thornton Park, MD;  Location: [facility];  Service: Gastroenterology;;  ? PROSTATECTOMY  02/1999  ?  ? ?A ?IV Location/Drains/Wounds ?Patient Lines/Drains/Airways Status   ? ? Active Line/Drains/Airways   ? ? Name Placement date Placement time Site Days  ? Peripheral IV 10/22/21 16 G Left Antecubital 10/22/21  1325  Antecubital  less than 1  ? Peripheral IV 10/22/21 18 G Right Antecubital 10/22/21  1632  Antecubital  less than 1  ? Wound / Incision (Open or Dehisced) 10/22/21 Skin tear Hand Left;Posterior 10/22/21  --  Hand  less than 1  ? ?  ?  ? ?  ? ? ?Intake/Output Last 24 hours ? ?Intake/Output Summary (Last 24 hours) at 10/22/2021 1944 ?Last data filed at 10/22/2021 1807 ?  Gross per 24 hour  ?Intake --  ?Output 30 ml  ?Net -30 ml  ? ? ?Labs/Imaging ?Results for orders placed or performed during the hospital encounter of 10/22/21 (from the past 48 hour(s))  ?Sample to Blood Bank     Status: None  ? Collection Time: 10/22/21  1:23 PM  ?Result Value Ref Range  ? Blood Bank Specimen SAMPLE AVAILABLE FOR TESTING   ? Sample Expiration    ?  10/23/2021,2359 ?Performed at [facility], [place] [address] [place], [place] [address] ?  ?Comprehensive metabolic panel     Status: Abnormal  ? Collection  Time: 10/22/21  1:31 PM  ?Result Value Ref Range  ? Sodium 134 (L) 135 - 145 mmol/L  ? Potassium 3.5 3.5 - 5.1 mmol/L  ? Chloride 102 98 - 111 mmol/L  ? CO2 25 22 - 32 mmol/L  ? Glucose, Bld 87 70 - 99 mg/dL  ?  Comment: Glucose reference range applies only to samples taken after fasting for at least 8 hours.  ? BUN 18 8 - 23 mg/dL  ? Creatinine, Ser 2.84 (H) 0.61 - 1.24 mg/dL  ? Calcium 8.0 (L) 8.9 - 10.3 mg/dL  ? Total Protein 6.8 6.5 - 8.1 g/dL  ? Albumin 1.9 (L) 3.5 - 5.0 g/dL  ? AST 15 15 - 41 U/L  ? ALT 11 0 - 44 U/L  ? Alkaline Phosphatase 59 38 - 126 U/L  ? Total Bilirubin 0.4 0.3 - 1.2 mg/dL  ? GFR, Estimated 20 (L) >60 mL/min  ?  Comment: (NOTE) ?Calculated using the CKD-EPI Creatinine Equation (2021) ?  ? Anion gap 7 5 - 15  ?  Comment: Performed at [facility], [place] [address] [place], [place] [address]  ?CBC     Status: Abnormal  ? Collection Time: 10/22/21  1:31 PM  ?Result Value Ref Range  ? WBC 8.4 4.0 - 10.5 K/uL  ? RBC 3.07 (L) 4.22 - 5.81 MIL/uL  ? Hemoglobin 8.4 (L) 13.0 - 17.0 g/dL  ? HCT 27.0 (L) 39.0 - 52.0 %  ? MCV 87.9 80.0 - 100.0 fL  ? MCH 27.4 26.0 - 34.0 pg  ? MCHC 31.1 30.0 - 36.0 g/dL  ? RDW 16.5 (H) 11.5 - 15.5 %  ? Platelets 275 150 - 400 K/uL  ? nRBC 0.0 0.0 - 0.2 %  ?  Comment: Performed at [facility], [place] [address] [place],  [address]  ?I-Stat Chem 8, ED     Status: Abnormal  ? Collection Time: 10/22/21  1:41 PM  ?Result Value Ref Range  ? Sodium 135 135 - 145 mmol/L  ? Potassium 3.6 3.5 - 5.1 mmol/L  ? Chloride 99 98 - 111 mmol/L  ? BUN 18 8 - 23 mg/dL  ? Creatinine, Ser 3.00 (H) 0.61 - 1.24 mg/dL  ? Glucose, Bld 86 70 - 99 mg/dL  ?  Comment: Glucose reference range applies only to samples taken after fasting for at least 8 hours.  ? Calcium, Ion 1.07 (L) 1.15 - 1.40 mmol/L  ? TCO2 25 22 - 32 mmol/L  ? Hemoglobin 9.2 (L) 13.0 - 17.0 g/dL  ? HCT 27.0 (L) 39.0 - 52.0 %  ? ?CT Abdomen Pelvis Wo Contrast ? ?Result Date: 10/22/2021 ?CLINICAL DATA:  Blunt trauma.   Tractor ran over pelvis area EXAM: CT ABDOMEN AND PELVIS WITHOUT CONTRAST TECHNIQUE: Multidetector CT imaging of the abdomen and pelvis was performed following the standard  protocol without IV contrast. RADIATION DOSE REDUCTION: This exam was performed according to the departmental dose-optimization program which includes automated exposure control, adjustment of the mA and/or kV according to patient size and/or use of iterative reconstruction technique. COMPARISON:  CT 11/30/2020 FINDINGS: Lower chest: Emphysematous lung bases. Mild cardiomegaly. Small pericardial effusion, unchanged. Relative hypoattenuation of the cardiac blood pool indicative of anemia. Hepatobiliary: Sludge and stones again noted within the gallbladder. Gallbladder is anteriorly displaced by large renal cysts. No focal liver lesion or evidence of hepatic injury. No perihepatic hematoma. Pancreas: Grossly unremarkable. Spleen: Normal in size without focal abnormality. Adrenals/Urinary Tract: No adrenal nodule. Stable appearance of numerous large bilateral renal cysts, largest within the upper pole of the right kidney measuring at least 22 cm. Near-complete replacement of normal renal parenchyma bilaterally. Large staghorn calculi in the left kidney, largest measuring up to 6.4 cm in diameter. No evidence of hydronephrosis. Urinary bladder is incompletely distended. Stomach/Bowel: Small hiatal hernia. No dilated loops of bowel. Colonic diverticulosis. No evidence of active bowel inflammation, although evaluation is limited in the absence of oral or IV contrast. Vascular/Lymphatic: Aortic atherosclerosis. No enlarged abdominal or pelvic lymph nodes. Reproductive: Prior prostatectomy. Other: Mild presacral edema is unchanged. No intra-abdominal or pelvic hematoma or evidence of hemoperitoneum. No pneumoperitoneum. Small fat containing right inguinal hernia. Musculoskeletal: Bilateral pubic bone fractures including involvement of the left  parasymphyseal pubic bone with left inferior pubic ramus and left pubic root fracture. Mildly displaced right inferior pubic ramus fracture and nondisplaced right pubic root fracture. Pubic symphysis intact without di

## 2021-10-22 NOTE — Consult Note (Addendum)
Reason for Consult:Pelvic fxs Referring Physician: Lajean Saver Time called: 1610 Time at bedside: Rick [name] is an 88 y.o. male.  HPI: Rick [name] was outside when his tractor engaged and rolled over him. He had immediate pelvic pain. He thought he may have been able to get up but family wouldn't let him. He was brought to the ED where x-rays showed multiple pubic rami fxs and orthopedic surgery was consulted. He lives at home and generally ambulates with a cane.  Past Medical History:  Diagnosis Date   Arthritis    GERD (gastroesophageal reflux disease)    History of colon polyps    History of prostate cancer    Hyperlipidemia    Hypertension    Kidney cysts    bilateral.  Renal US   Mitral regurgitation 12/02/2020    Past Surgical History:  Procedure Laterality Date   BIOPSY  05/05/2021   Procedure: BIOPSY;  Surgeon: Thornton Park, MD;  Location: [facility];  Service: Gastroenterology;;   COLONOSCOPY     CYSTOSCOPY  02/12/99   biopsy   ESOPHAGEAL BANDING N/A 08/04/2012   Procedure: ESOPHAGEAL BANDING;  Surgeon: Inda Castle, MD;  [facility];  Service: Endoscopy;  Laterality: N/A;   ESOPHAGOGASTRODUODENOSCOPY N/A 08/04/2012   Procedure: ESOPHAGOGASTRODUODENOSCOPY (EGD);  Surgeon: Inda Castle, MD;  Location[facility];  Service: Endoscopy;  Laterality: N/A;   ESOPHAGOGASTRODUODENOSCOPY (EGD) WITH PROPOFOL N/A 05/05/2021   Procedure: ESOPHAGOGASTRODUODENOSCOPY (EGD) WITH PROPOFOL;  Surgeon: Thornton Park, MD;  Location: [facility];  Service: Gastroenterology;  Laterality: N/A;   INGUINAL HERNIA REPAIR  02/1999   Dr. Reece Agar   POLYPECTOMY  05/05/2021   Procedure: POLYPECTOMY;  Surgeon: Thornton Park, MD;  Location: [facility];  Service: Gastroenterology;;   PROSTATECTOMY  02/1999    Family History  Problem Relation Age of Onset   Bone cancer Sister    Stomach cancer Brother    Stomach cancer Brother    Lung cancer Sister    Colon  cancer Sister    Stroke Mother    Colon cancer Other        nephew    Social History:  reports that he quit smoking about 20 years ago. His smoking use included cigarettes. His smokeless tobacco use includes chew. He reports current alcohol use. He reports that he does not use drugs.  Allergies:  Allergies  Allergen Reactions   Lisinopril     cough   Nsaids     Elevated Cr    Medications: I have reviewed the patient's current medications.  Results for orders placed or performed during the hospital encounter of 10/22/21 (from the past 48 hour(s))  Sample to Blood Bank     Status: None   Collection Time: 10/22/21  1:23 PM  Result Value Ref Range   Blood Bank Specimen SAMPLE AVAILABLE FOR TESTING    Sample Expiration      10/23/2021,2359 Performed at [facility], [address] [place], [place] [address]   Comprehensive metabolic panel     Status: Abnormal   Collection Time: 10/22/21  1:31 PM  Result Value Ref Range   Sodium 134 (L) 135 - 145 mmol/L   Potassium 3.5 3.5 - 5.1 mmol/L   Chloride 102 98 - 111 mmol/L   CO2 25 22 - 32 mmol/L   Glucose, Bld 87 70 - 99 mg/dL    Comment: Glucose reference range applies only to samples taken after fasting for at least 8 hours.  BUN 18 8 - 23 mg/dL   Creatinine, Ser 2.84 (H) 0.61 - 1.24 mg/dL   Calcium 8.0 (L) 8.9 - 10.3 mg/dL   Total Protein 6.8 6.5 - 8.1 g/dL   Albumin 1.9 (L) 3.5 - 5.0 g/dL   AST 15 15 - 41 U/L   ALT 11 0 - 44 U/L   Alkaline Phosphatase 59 38 - 126 U/L   Total Bilirubin 0.4 0.3 - 1.2 mg/dL   GFR, Estimated 20 (L) >60 mL/min    Comment: (NOTE) Calculated using the CKD-EPI Creatinine Equation (2021)    Anion gap 7 5 - 15    Comment: Performed at [facility], [place] [address], [place], [place] [address]  CBC     Status: Abnormal   Collection Time: 10/22/21  1:31 PM  Result Value Ref Range   WBC 8.4 4.0 - 10.5 K/uL   RBC 3.07 (L) 4.22 - 5.81 MIL/uL   Hemoglobin 8.4 (L) 13.0 - 17.0 g/dL   HCT  27.0 (L) 39.0 - 52.0 %   MCV 87.9 80.0 - 100.0 fL   MCH 27.4 26.0 - 34.0 pg   MCHC 31.1 30.0 - 36.0 g/dL   RDW 16.5 (H) 11.5 - 15.5 %   Platelets 275 150 - 400 K/uL   nRBC 0.0 0.0 - 0.2 %    Comment: Performed at [facility] [address], [place], [place] [address]  I-Stat Chem 8, ED     Status: Abnormal   Collection Time: 10/22/21  1:41 PM  Result Value Ref Range   Sodium 135 135 - 145 mmol/L   Potassium 3.6 3.5 - 5.1 mmol/L   Chloride 99 98 - 111 mmol/L   BUN 18 8 - 23 mg/dL   Creatinine, Ser 3.00 (H) 0.61 - 1.24 mg/dL   Glucose, Bld 86 70 - 99 mg/dL    Comment: Glucose reference range applies only to samples taken after fasting for at least 8 hours.   Calcium, Ion 1.07 (L) 1.15 - 1.40 mmol/L   TCO2 25 22 - 32 mmol/L   Hemoglobin 9.2 (L) 13.0 - 17.0 g/dL   HCT 27.0 (L) 39.0 - 52.0 %    CT Abdomen Pelvis Wo Contrast  Result Date: 10/22/2021 CLINICAL DATA:  Blunt trauma.  Tractor ran over pelvis area EXAM: CT ABDOMEN AND PELVIS WITHOUT CONTRAST TECHNIQUE: Multidetector CT imaging of the abdomen and pelvis was performed following the standard protocol without IV contrast. RADIATION DOSE REDUCTION: This exam was performed according to the departmental dose-optimization program which includes automated exposure control, adjustment of the mA and/or kV according to patient size and/or use of iterative reconstruction technique. COMPARISON:  CT 11/30/2020 FINDINGS: Lower chest: Emphysematous lung bases. Mild cardiomegaly. Small pericardial effusion, unchanged. Relative hypoattenuation of the cardiac blood pool indicative of anemia. Hepatobiliary: Sludge and stones again noted within the gallbladder. Gallbladder is anteriorly displaced by large renal cysts. No focal liver lesion or evidence of hepatic injury. No perihepatic hematoma. Pancreas: Grossly unremarkable. Spleen: Normal in size without focal abnormality. Adrenals/Urinary Tract: No adrenal nodule. Stable appearance of numerous  large bilateral renal cysts, largest within the upper pole of the right kidney measuring at least 22 cm. Near-complete replacement of normal renal parenchyma bilaterally. Large staghorn calculi in the left kidney, largest measuring up to 6.4 cm in diameter. No evidence of hydronephrosis. Urinary bladder is incompletely distended. Stomach/Bowel: Small hiatal hernia. No dilated loops of bowel. Colonic diverticulosis. No evidence of active bowel inflammation, although  evaluation is limited in the absence of oral or IV contrast. Vascular/Lymphatic: Aortic atherosclerosis. No enlarged abdominal or pelvic lymph nodes. Reproductive: Prior prostatectomy. Other: Mild presacral edema is unchanged. No intra-abdominal or pelvic hematoma or evidence of hemoperitoneum. No pneumoperitoneum. Small fat containing right inguinal hernia. Musculoskeletal: Bilateral pubic bone fractures including involvement of the left parasymphyseal pubic bone with left inferior pubic ramus and left pubic root fracture. Mildly displaced right inferior pubic ramus fracture and nondisplaced right pubic root fracture. Pubic symphysis intact without diastasis. Bilateral SI joints intact without diastasis. Bilateral hip joints intact. No fracture of the proximal femurs. Lumbar vertebral body heights are maintained. Degenerative grade 1 anterolisthesis of L4 on L5 is unchanged. No appreciable soft tissue hematoma. IMPRESSION: 1. Acute multiple bilateral pubic bone fractures, as described above. 2. No evidence of acute traumatic solid organ injury to the abdomen or pelvis. 3. Stable appearance of numerous large bilateral renal cysts, largest within the upper pole of the right kidney measuring at least 22 cm. Large staghorn calculi in the left kidney, largest measuring up to 6.4 cm in diameter. No evidence of hydronephrosis. 4. Sludge and stones within the gallbladder. 5. Colonic diverticulosis without evidence of active bowel inflammation. 6. Small  pericardial effusion, unchanged. Aortic Atherosclerosis (ICD10-I70.0) and Emphysema (ICD10-J43.9). Electronically Signed   By: Davina Poke D.O.   On: 10/22/2021 14:08   DG Pelvis Portable  Result Date: 10/22/2021 CLINICAL DATA:  Run over by a tractor.  Pelvic and abdominal pain. EXAM: PORTABLE PELVIS 1-2 VIEWS COMPARISON:  None Available. FINDINGS: No displaced fracture or dislocation. Diffuse osteopenia limits evaluation. Multiple surgical clips overlying the pelvic cavity. Hyperdense foreign body projecting over the proximal right femur. IMPRESSION: No definite evidence of acute displaced fracture or dislocation. Diffuse osteopenia limits evaluation. Cross-sectional imaging could be obtained for further evaluation if clinically warranted. Electronically Signed   By: Keane Police D.O.   On: 10/22/2021 13:43   DG Chest Port 1 View  Result Date: 10/22/2021 CLINICAL DATA:  Ran over by a tractor. EXAM: PORTABLE CHEST 1 VIEW COMPARISON:  None Available. FINDINGS: The heart is enlarged. Atherosclerotic calcification of the aortic arch. Lungs are clear without evidence of focal consolidation or pleural effusion. No appreciable pneumothorax. No acute displaced fracture. IMPRESSION: No active disease. Electronically Signed   By: Keane Police D.O.   On: 10/22/2021 13:45    Review of Systems  HENT:  Negative for ear discharge, ear pain, hearing loss and tinnitus.   Eyes:  Negative for photophobia and pain.  Respiratory:  Negative for cough and shortness of breath.   Cardiovascular:  Negative for chest pain.  Gastrointestinal:  Negative for abdominal pain, nausea and vomiting.  Genitourinary:  Negative for dysuria, flank pain, frequency and urgency.  Musculoskeletal:  Positive for arthralgias (Pelvic pain). Negative for back pain, myalgias and neck pain.  Neurological:  Negative for dizziness and headaches.  Hematological:  Does not bruise/bleed easily.  Psychiatric/Behavioral:  The patient is not  nervous/anxious.   Blood pressure 128/73, pulse 61, temperature 97.8 F (36.6 C), temperature source Tympanic, resp. rate 18, height '5\' 8"'$  (1.727 m), weight 68 kg, SpO2 98 %. Physical Exam Constitutional:      General: He is not in acute distress.    Appearance: He is well-developed. He is not diaphoretic.  HENT:     Head: Normocephalic and atraumatic.  Eyes:     General: No scleral icterus.       Right eye: No discharge.  Left eye: No discharge.     Conjunctiva/sclera: Conjunctivae normal.  Cardiovascular:     Rate and Rhythm: Normal rate and regular rhythm.  Pulmonary:     Effort: Pulmonary effort is normal. No respiratory distress.  Musculoskeletal:     Cervical back: Normal range of motion.     Comments: Pelvis--no traumatic wounds or rash, no ecchymosis, stable to manual stress, mild TTP AP compression  BLE No traumatic wounds, ecchymosis, or rash  Nontender  No knee or ankle effusion  Knee stable to varus/ valgus and anterior/posterior stress  Sens DPN, SPN, TN intact  Motor EHL, ext, flex, evers 5/5  DP 2+, PT 1+, No significant edema  Skin:    General: Skin is warm and dry.  Neurological:     Mental Status: He is alert.  Psychiatric:        Mood and Affect: Mood normal.        Behavior: Behavior normal.    Assessment/Plan: Pubic rami fxs -- Pt may be WBAT BLE. F/u with Dr. Marcelino Scot in 2-3 weeks. Left 5th MC fx -- Plan non-operative management in ulnar gutter, NWB (ok to WBAT through elbow)    Lisette Abu, PA-C Orthopedic Surgery [phone] 10/22/2021, 2:44 PM

## 2021-10-22 NOTE — ED Notes (Signed)
C-collar removed by EDP.   

## 2021-10-22 NOTE — ED Provider Notes (Addendum)
[facility] EMERGENCY DEPARTMENT Provider Note   CSN: [id number] Arrival date & time: 10/22/21  1322     History  Chief Complaint  Patient presents with   tractor accident    Rick [name] is a 88 y.o. male.  Patient s/p being run over by tractor. Pt indicates was working on tractor, was finishing up when accidentally put it in gear and couldn't get out of way fast enough, and wheel ran over pelvis area. Mild pain to left lower pelvis/hip area. Denies head injury or loc, no headache. No neck or back pain. No chest pain or sob. No abd pain. No nv. Denies other extremity pain or injury. Skin intact other than small skin tear to left hand. No anticoag use.   The history is provided by the patient, the EMS personnel and medical records.      Home Medications Prior to Admission medications   Medication Sig Start Date End Date Taking? Authorizing Provider  amLODipine (NORVASC) 10 MG tablet Take 10 mg by mouth daily. Patient not taking: Reported on 08/19/2021 03/29/21   [provider]  atorvastatin (LIPITOR) 10 MG tablet Take 1 tablet (10 mg total) by mouth at bedtime. 12/07/20   Joaquim Nam, MD  ferrous sulfate 325 (65 FE) MG tablet Take 1 tablet (325 mg total) by mouth 2 (two) times daily with a meal. 05/07/21 05/07/22  Zannie Cove, MD  furosemide (LASIX) 40 MG tablet Take 1 tablet (40 mg total) by mouth daily. 05/07/21   Zannie Cove, MD  latanoprost (XALATAN) 0.005 % ophthalmic solution Place 1 drop into both eyes at bedtime.    [provider]  levothyroxine (SYNTHROID) 25 MCG tablet Take 1 tablet (25 mcg total) by mouth daily. Take on empty stomach early in the morning. 09/11/21   Joaquim Nam, MD  omeprazole (PRILOSEC OTC) 20 MG tablet Take 1 tablet (20 mg total) by mouth daily. 12/07/20   Joaquim Nam, MD  senna-docusate (SENOKOT-S) 8.6-50 MG tablet Take 1 tablet by mouth at bedtime as needed for mild constipation. 05/07/21    Zannie Cove, MD  sodium bicarbonate 650 MG tablet Take 2 tablets (1,300 mg total) by mouth 2 (two) times daily. For 8 days 05/09/21   Joaquim Nam, MD  timolol (BETIMOL) 0.25 % ophthalmic solution Place 1 drop into both eyes daily. 05/09/21   Joaquim Nam, MD      Allergies    Lisinopril and Nsaids    Review of Systems   Review of Systems  Constitutional:  Negative for fever.  HENT:  Negative for nosebleeds.   Eyes:  Negative for pain and visual disturbance.  Respiratory:  Negative for shortness of breath.   Cardiovascular:  Negative for chest pain.  Gastrointestinal:  Negative for abdominal pain and vomiting.  Genitourinary:  Negative for flank pain.  Musculoskeletal:  Negative for back pain and neck pain.  Skin:  Negative for rash.  Neurological:  Negative for headaches.  Hematological:  Does not bruise/bleed easily.  Psychiatric/Behavioral:  Negative for confusion.    Physical Exam Updated Vital Signs BP 125/80   Pulse 68   Temp 97.8 F (36.6 C) (Tympanic)   Resp 18   SpO2 (!) 89%  Physical Exam Vitals and nursing note reviewed.  Constitutional:      Appearance: Normal appearance. He is well-developed.  HENT:     Head: Atraumatic.     Nose: Nose normal.     Mouth/Throat:  Mouth: Mucous membranes are moist.     Pharynx: Oropharynx is clear.  Eyes:     General: No scleral icterus.    Conjunctiva/sclera: Conjunctivae normal.     Pupils: Pupils are equal, round, and reactive to light.  Neck:     Trachea: No tracheal deviation.  Cardiovascular:     Rate and Rhythm: Normal rate and regular rhythm.     Pulses: Normal pulses.     Heart sounds: Normal heart sounds. No murmur heard.   No friction rub. No gallop.  Pulmonary:     Effort: Pulmonary effort is normal. No accessory muscle usage or respiratory distress.     Breath sounds: Normal breath sounds.  Chest:     Chest wall: No tenderness.  Abdominal:     General: Bowel sounds are normal. There is no  distension.     Palpations: Abdomen is soft.     Tenderness: There is no abdominal tenderness. There is no guarding.     Comments: No abd contusion or bruising noted.   Genitourinary:    Comments: No cva tenderness. Musculoskeletal:        General: No swelling.     Cervical back: Normal range of motion and neck supple. No rigidity or tenderness.     Comments: CTLS spine, non tender, aligned, no step off. Pelvis grossly stable.  Mild tenderness left hip, otherwise good rom bil extremities without pain or focal bony tenderness.   Skin:    General: Skin is warm and dry.     Findings: No rash.  Neurological:     Mental Status: He is alert.     Comments: Alert, speech clear. GCS 15. Motor/sens grossly intact bil.   Psychiatric:        Mood and Affect: Mood normal.    ED Results / Procedures / Treatments   Labs (all labs ordered are listed, but only abnormal results are displayed) Results for orders placed or performed during the hospital encounter of 10/22/21  CBC  Result Value Ref Range   WBC 8.4 4.0 - 10.5 K/uL   RBC 3.07 (L) 4.22 - 5.81 MIL/uL   Hemoglobin 8.4 (L) 13.0 - 17.0 g/dL   HCT 16.1 (L) 09.6 - 04.5 %   MCV 87.9 80.0 - 100.0 fL   MCH 27.4 26.0 - 34.0 pg   MCHC 31.1 30.0 - 36.0 g/dL   RDW 40.9 (H) 81.1 - 91.4 %   Platelets 275 150 - 400 K/uL   nRBC 0.0 0.0 - 0.2 %  I-Stat Chem 8, ED  Result Value Ref Range   Sodium 135 135 - 145 mmol/L   Potassium 3.6 3.5 - 5.1 mmol/L   Chloride 99 98 - 111 mmol/L   BUN 18 8 - 23 mg/dL   Creatinine, Ser 7.82 (H) 0.61 - 1.24 mg/dL   Glucose, Bld 86 70 - 99 mg/dL   Calcium, Ion 9.56 (L) 1.15 - 1.40 mmol/L   TCO2 25 22 - 32 mmol/L   Hemoglobin 9.2 (L) 13.0 - 17.0 g/dL   HCT 21.3 (L) 08.6 - 57.8 %  Sample to Blood Bank  Result Value Ref Range   Blood Bank Specimen SAMPLE AVAILABLE FOR TESTING    Sample Expiration      10/23/2021,2359 Performed at [facility], [address] [place], [place] [address]      EKG None  Radiology CT Abdomen Pelvis Wo Contrast  Result Date: 10/22/2021 CLINICAL DATA:  Blunt trauma.  Tractor ran over  pelvis area EXAM: CT ABDOMEN AND PELVIS WITHOUT CONTRAST TECHNIQUE: Multidetector CT imaging of the abdomen and pelvis was performed following the standard protocol without IV contrast. RADIATION DOSE REDUCTION: This exam was performed according to the departmental dose-optimization program which includes automated exposure control, adjustment of the mA and/or kV according to patient size and/or use of iterative reconstruction technique. COMPARISON:  CT 11/30/2020 FINDINGS: Lower chest: Emphysematous lung bases. Mild cardiomegaly. Small pericardial effusion, unchanged. Relative hypoattenuation of the cardiac blood pool indicative of anemia. Hepatobiliary: Sludge and stones again noted within the gallbladder. Gallbladder is anteriorly displaced by large renal cysts. No focal liver lesion or evidence of hepatic injury. No perihepatic hematoma. Pancreas: Grossly unremarkable. Spleen: Normal in size without focal abnormality. Adrenals/Urinary Tract: No adrenal nodule. Stable appearance of numerous large bilateral renal cysts, largest within the upper pole of the right kidney measuring at least 22 cm. Near-complete replacement of normal renal parenchyma bilaterally. Large staghorn calculi in the left kidney, largest measuring up to 6.4 cm in diameter. No evidence of hydronephrosis. Urinary bladder is incompletely distended. Stomach/Bowel: Small hiatal hernia. No dilated loops of bowel. Colonic diverticulosis. No evidence of active bowel inflammation, although evaluation is limited in the absence of oral or IV contrast. Vascular/Lymphatic: Aortic atherosclerosis. No enlarged abdominal or pelvic lymph nodes. Reproductive: Prior prostatectomy. Other: Mild presacral edema is unchanged. No intra-abdominal or pelvic hematoma or evidence of hemoperitoneum. No pneumoperitoneum. Small fat  containing right inguinal hernia. Musculoskeletal: Bilateral pubic bone fractures including involvement of the left parasymphyseal pubic bone with left inferior pubic ramus and left pubic root fracture. Mildly displaced right inferior pubic ramus fracture and nondisplaced right pubic root fracture. Pubic symphysis intact without diastasis. Bilateral SI joints intact without diastasis. Bilateral hip joints intact. No fracture of the proximal femurs. Lumbar vertebral body heights are maintained. Degenerative grade 1 anterolisthesis of L4 on L5 is unchanged. No appreciable soft tissue hematoma. IMPRESSION: 1. Acute multiple bilateral pubic bone fractures, as described above. 2. No evidence of acute traumatic solid organ injury to the abdomen or pelvis. 3. Stable appearance of numerous large bilateral renal cysts, largest within the upper pole of the right kidney measuring at least 22 cm. Large staghorn calculi in the left kidney, largest measuring up to 6.4 cm in diameter. No evidence of hydronephrosis. 4. Sludge and stones within the gallbladder. 5. Colonic diverticulosis without evidence of active bowel inflammation. 6. Small pericardial effusion, unchanged. Aortic Atherosclerosis (ICD10-I70.0) and Emphysema (ICD10-J43.9). Electronically Signed   By: Duanne Guess D.O.   On: 10/22/2021 14:08   DG Pelvis Portable  Result Date: 10/22/2021 CLINICAL DATA:  Run over by a tractor.  Pelvic and abdominal pain. EXAM: PORTABLE PELVIS 1-2 VIEWS COMPARISON:  None Available. FINDINGS: No displaced fracture or dislocation. Diffuse osteopenia limits evaluation. Multiple surgical clips overlying the pelvic cavity. Hyperdense foreign body projecting over the proximal right femur. IMPRESSION: No definite evidence of acute displaced fracture or dislocation. Diffuse osteopenia limits evaluation. Cross-sectional imaging could be obtained for further evaluation if clinically warranted. Electronically Signed   By: Larose Hires D.O.    On: 10/22/2021 13:43   DG Chest Port 1 View  Result Date: 10/22/2021 CLINICAL DATA:  Ran over by a tractor. EXAM: PORTABLE CHEST 1 VIEW COMPARISON:  None Available. FINDINGS: The heart is enlarged. Atherosclerotic calcification of the aortic arch. Lungs are clear without evidence of focal consolidation or pleural effusion. No appreciable pneumothorax. No acute displaced fracture. IMPRESSION: No active disease. Electronically Signed   By: Leona Carry  Ahmed D.O.   On: 10/22/2021 13:45    Procedures Procedures    Medications Ordered in ED Medications - No data to display  ED Course/ Medical Decision Making/ A&P                           Medical Decision Making Problems Addressed: Accidental fall, initial encounter: acute illness or injury with systemic symptoms that poses a threat to life or bodily functions Bilateral pubic rami fractures, closed, initial encounter [facility]): acute illness or injury Blunt traumatic injury of thoraco-abdomino-pelvic region: acute illness or injury with systemic symptoms that poses a threat to life or bodily functions Chronic anemia: chronic illness or injury with exacerbation, progression, or side effects of treatment that poses a threat to life or bodily functions Stage 4 chronic kidney disease (HCC): chronic illness or injury that poses a threat to life or bodily functions  Amount and/or Complexity of Data Reviewed Independent Historian: EMS    Details: hx External Data Reviewed: notes. Labs: ordered. Decision-making details documented in ED Course. Radiology: ordered. Discussion of management or test interpretation with external provider(s): Trauma surgery, discussed pt, imaging  Risk Prescription drug management. Parenteral controlled substances. Decision regarding hospitalization.   Iv ns. Continuous pulse ox and cardiac monitoring. Labs ordered/sent. Imaging ordered.   Reviewed nursing notes and prior charts for additional history. External reports  reviewed. Additional history from: EMS.  Cardiac monitor: sinus rhythm, rate 70.   Morphine iv for pain.   Labs reviewed/interpreted by me - hgb low, hx anemia - hgb sl decreased from prior. CKD.   Xrays reviewed/interpreted by me - no obvious, displaced fx.   CT reviewed/interpreted by me -  +multiple pubic fxs.   Given bilateral pubic rami fxs, pain, need for pain control, PT, possible rehab, etc, trauma surgery consulted for admission - discussed w Dr Janee Morn - will admit.   Orthopedics consulted - discussed w APP Prince Rome - they will leave note, indicates wt bear as tol, pt.          Final Clinical Impression(s) / ED Diagnoses Final diagnoses:  None    Rx / DC Orders ED Discharge Orders     None         Cathren Laine, MD 10/22/21 1435

## 2021-10-22 NOTE — Progress Notes (Signed)
Trauma Response Nurse Documentation ? ? ?Rick [name] is a 88 y.o. male arriving to [facility] ED via EMS on  10/22/21 . Trauma was activated as a Level 2 by ED charge based on the following trauma criteria Crush injury to extremity. Trauma team at the bedside on patient arrival. Patient cleared for CT by Dr. Ashok Cordia. Patient to CT with team. GCS 15. ? ?History  ? Past Medical History:  ?Diagnosis Date  ? Arthritis   ? GERD (gastroesophageal reflux disease)   ? History of colon polyps   ? History of prostate cancer   ? Hyperlipidemia   ? Hypertension   ? Kidney cysts   ? bilateral.  Renal US  ? Mitral regurgitation 12/02/2020  ?  ? Past Surgical History:  ?Procedure Laterality Date  ? BIOPSY  05/05/2021  ? Procedure: BIOPSY;  Surgeon: Thornton Park, MD;  Location: [place];  Service: Gastroenterology;;  ? COLONOSCOPY    ? CYSTOSCOPY  02/12/99  ? biopsy  ? ESOPHAGEAL BANDING N/A 08/04/2012  ? Procedure: ESOPHAGEAL BANDING;  Surgeon: Inda Castle, MD;  Location: [facility];  Service: Endoscopy;  Laterality: N/A;  ? ESOPHAGOGASTRODUODENOSCOPY N/A 08/04/2012  ? Procedure: ESOPHAGOGASTRODUODENOSCOPY (EGD);  Surgeon: Inda Castle, MD;  Location: [facility];  Service: Endoscopy;  Laterality: N/A;  ? ESOPHAGOGASTRODUODENOSCOPY (EGD) WITH PROPOFOL N/A 05/05/2021  ? Procedure: ESOPHAGOGASTRODUODENOSCOPY (EGD) WITH PROPOFOL;  Surgeon: Thornton Park, MD;  Location: [place];  Service: Gastroenterology;  Laterality: N/A;  ? INGUINAL HERNIA REPAIR  02/1999  ? Dr. Reece Agar  ? POLYPECTOMY  05/05/2021  ? Procedure: POLYPECTOMY;  Surgeon: Thornton Park, MD;  Location: [facility] ENDOSCOPY;  Service: Gastroenterology;;  ? PROSTATECTOMY  02/1999  ?  ? ? ? ?Initial Focused Assessment (If applicable, or please see trauma documentation): ? ?No massive hemorrhage.  ?Airway, breath, and circulation WDL.  ?No neurologic disability.  ?Warm blankets applied.  ?IV and blood work obtained.  ?CXR and pelvis obtained. ?Pt  transported to/from CT. ?No pelvic instability, but pt complains of severe pain in left leg.  ?C-collar removed per EDP. ? ?CT's Completed:   ?CT abdomen/pelvis w/ contrast  ? ? ?Plan for disposition:  ?Admission to [facility]  ? ?Consults completed:  ?Orthopaedic Surgeon at 1405. ? ? ?Bedside handoff with ED RN Burman Nieves.   ? ?Feliza Diven L Ryder Man  ?Trauma Response RN ? ?Please call TRN at [phone] for further assistance. ?  ?

## 2021-10-22 NOTE — Progress Notes (Signed)
?  10/22/21 1309  ?Clinical Encounter Type  ?Visited With Family;Patient not available  ?Visit Type ED;Trauma;Initial ?(Level 2 Trauma)  ?Referral From Nurse  ?Consult/Referral To Chaplain ?Albertina Parr [place])  ?Spiritual Encounters  ?Spiritual Needs Emotional  ? ?Responded to page in E.D. Trauma Room B for Level 2 Trauma. Patient taken to CT and not seen by Chaplain at this time. Met patient's wife, Rick [name] and his granddaughter in Trauma B. Provided meaningful presence and emotional support for family.   ?Chaplain Keston Seever, M.Min., [phone].   ?

## 2021-10-22 NOTE — H&P (Signed)
? ? ? ?Rick [name] ?May 02, 1929  ?[id number].   ? ?Requesting MD: Dr. Lajean Saver ?Chief Complaint/Reason for Consult: Ran over by tractor ? ?HPI: Rick [name] is a 88 y.o. male with a history of hypertension, hyperlipidemia, GERD, hypothyroidism, renal cysts with CKD and a remote history of prostate cancer status post prostatectomy who presented to the ED after he was working on his tractor and it ran over him.  Patient reports he was finishing up and actually put the tractor in gear and could not get out of the way fast enough.  Notes the wheel ran over his pelvic region.  Reports pain in his pelvis and left hand. No other complaints.  Has not been able to walk since this time. Underwent chest x-ray that was negative. Pelvic x-rays inconclusive.  CT A/P showed acute multiple bilateral pubic bone fractures.  This was done without contrast secondary to patient's underlying kidney function but there did not appear to be any pelvic hematomas. Hgb stable at his baseline of 9.2. We were asked to see for admission. Patient lives with his wife and uses a cane on the right at baseline. He is not on any blood thinners. NKDA.  ? ?ROS: ?ROS ?As above ? ?Family History  ?Problem Relation Age of Onset  ? Bone cancer Sister   ? Stomach cancer Brother   ? Stomach cancer Brother   ? Lung cancer Sister   ? Colon cancer Sister   ? Stroke Mother   ? Colon cancer Other   ?     nephew  ? ? ?Past Medical History:  ?Diagnosis Date  ? Arthritis   ? GERD (gastroesophageal reflux disease)   ? History of colon polyps   ? History of prostate cancer   ? Hyperlipidemia   ? Hypertension   ? Kidney cysts   ? bilateral.  Renal US  ? Mitral regurgitation 12/02/2020  ? ? ?Past Surgical History:  ?Procedure Laterality Date  ? BIOPSY  05/05/2021  ? Procedure: BIOPSY;  Surgeon: Thornton Park, MD;  Location: [place];  Service: Gastroenterology;;  ? COLONOSCOPY    ? CYSTOSCOPY  02/12/99  ? biopsy  ? ESOPHAGEAL BANDING N/A 08/04/2012  ?  Procedure: ESOPHAGEAL BANDING;  Surgeon: Inda Castle, MD;  Location: [facility];  Service: Endoscopy;  Laterality: N/A;  ? ESOPHAGOGASTRODUODENOSCOPY N/A 08/04/2012  ? Procedure: ESOPHAGOGASTRODUODENOSCOPY (EGD);  Surgeon: Inda Castle, MD;  Location: [facility];  Service: Endoscopy;  Laterality: N/A;  ? ESOPHAGOGASTRODUODENOSCOPY (EGD) WITH PROPOFOL N/A 05/05/2021  ? Procedure: ESOPHAGOGASTRODUODENOSCOPY (EGD) WITH PROPOFOL;  Surgeon: Thornton Park, MD;  Location: [place];  Service: Gastroenterology;  Laterality: N/A;  ? INGUINAL HERNIA REPAIR  02/1999  ? Dr. Reece Agar  ? POLYPECTOMY  05/05/2021  ? Procedure: POLYPECTOMY;  Surgeon: Thornton Park, MD;  Location: [facility] END[facility]COPY;  Service: Gastroenterology;;  ? PROSTATECTOMY  02/1999  ? ? ?Social History:  reports that he quit smoking about 20 years ago. His smoking use included cigarettes. His smokeless tobacco use includes chew. He reports current alcohol use. He reports that he does not use drugs. ? ?Allergies:  ?Allergies  ?Allergen Reactions  ? Lisinopril   ?  cough  ? Nsaids   ?  Elevated Cr  ? ? ?(Not in a hospital admission) ? ? ? ?Physical Exam: ?Blood pressure 128/73, pulse 61, temperature 97.8 ?F (36.6 ?C), temperature source Tympanic, resp. rate 18, height '5\' 8"'$  (1.727 m), weight 68 kg, SpO2 98 %. ?General: pleasant, WD, elderly male  who is laying in bed in NAD and appears stated age ?HEENT: head is normocephalic, atraumatic.  Sclera are noninjected.  Pupils equal and round. EOMI.  Ears and nose without any masses or lesions.  Mouth is pink and moist. Dentition poor ?Heart: regular, rate, and rhythm.  Normal s1,s2. No obvious murmurs, gallops, or rubs noted.  Palpable radial pulses bilaterally, 3+ pitting edema in BLE R>L ?Lungs: CTAB, no wheezes, rhonchi, or rales noted.  Respiratory effort nonlabored ?Abd: Soft, NT/ND, +BS, no masses, hernias, or organomegaly ?MS: left hand edema with abrasion over lateral dorsum, L fingers NVI; edema  of BLE, BLE NVI with some decreased sensation in the left ?Skin: warm and dry with no masses, lesions, or rashes ?Psych: A&Ox4 with an appropriate affect ?Neuro: cranial nerves grossly intact, equal strength in BLE, normal speech, thought process intact, moves all extremities, gait not assessed ? ? ?Results for orders placed or performed during the hospital encounter of 10/22/21 (from the past 48 hour(s))  ?Sample to Blood Bank     Status: None  ? Collection Time: 10/22/21  1:23 PM  ?Result Value Ref Range  ? Blood Bank Specimen SAMPLE AVAILABLE FOR TESTING   ? Sample Expiration    ?  10/23/2021,2359 ?Performed at [facility], [place] [address] [place], [place] [address] ?  ?Comprehensive metabolic panel     Status: Abnormal  ? Collection Time: 10/22/21  1:31 PM  ?Result Value Ref Range  ? Sodium 134 (L) 135 - 145 mmol/L  ? Potassium 3.5 3.5 - 5.1 mmol/L  ? Chloride 102 98 - 111 mmol/L  ? CO2 25 22 - 32 mmol/L  ? Glucose, Bld 87 70 - 99 mg/dL  ?  Comment: Glucose reference range applies only to samples taken after fasting for at least 8 hours.  ? BUN 18 8 - 23 mg/dL  ? Creatinine, Ser 2.84 (H) 0.61 - 1.24 mg/dL  ? Calcium 8.0 (L) 8.9 - 10.3 mg/dL  ? Total Protein 6.8 6.5 - 8.1 g/dL  ? Albumin 1.9 (L) 3.5 - 5.0 g/dL  ? AST 15 15 - 41 U/L  ? ALT 11 0 - 44 U/L  ? Alkaline Phosphatase 59 38 - 126 U/L  ? Total Bilirubin 0.4 0.3 - 1.2 mg/dL  ? GFR, Estimated 20 (L) >60 mL/min  ?  Comment: (NOTE) ?Calculated using the CKD-EPI Creatinine Equation (2021) ?  ? Anion gap 7 5 - 15  ?  Comment: Performed at [facility], [place] [address] [place], [place] [address]  ?CBC     Status: Abnormal  ? Collection Time: 10/22/21  1:31 PM  ?Result Value Ref Range  ? WBC 8.4 4.0 - 10.5 K/uL  ? RBC 3.07 (L) 4.22 - 5.81 MIL/uL  ? Hemoglobin 8.4 (L) 13.0 - 17.0 g/dL  ? HCT 27.0 (L) 39.0 - 52.0 %  ? MCV 87.9 80.0 - 100.0 fL  ? MCH 27.4 26.0 - 34.0 pg  ? MCHC 31.1 30.0 - 36.0 g/dL  ? RDW 16.5 (H) 11.5 - 15.5 %  ? Platelets 275 150 -  400 K/uL  ? nRBC 0.0 0.0 - 0.2 %  ?  Comment: Performed at [facility], [place] [address], [place], [place] [address]  ?I-Stat Chem 8, ED     Status: Abnormal  ? Collection Time: 10/22/21  1:41 PM  ?Result Value Ref Range  ? Sodium 135 135 - 145 mmol/L  ? Potassium 3.6 3.5 - 5.1 mmol/L  ?  Chloride 99 98 - 111 mmol/L  ? BUN 18 8 - 23 mg/dL  ? Creatinine, Ser 3.00 (H) 0.61 - 1.24 mg/dL  ? Glucose, Bld 86 70 - 99 mg/dL  ?  Comment: Glucose reference range applies only to samples taken after fasting for at least 8 hours.  ? Calcium, Ion 1.07 (L) 1.15 - 1.40 mmol/L  ? TCO2 25 22 - 32 mmol/L  ? Hemoglobin 9.2 (L) 13.0 - 17.0 g/dL  ? HCT 27.0 (L) 39.0 - 52.0 %  ? ?CT Abdomen Pelvis Wo Contrast ? ?Result Date: 10/22/2021 ?CLINICAL DATA:  Blunt trauma.  Tractor ran over pelvis area EXAM: CT ABDOMEN AND PELVIS WITHOUT CONTRAST TECHNIQUE: Multidetector CT imaging of the abdomen and pelvis was performed following the standard protocol without IV contrast. RADIATION DOSE REDUCTION: This exam was performed according to the departmental dose-optimization program which includes automated exposure control, adjustment of the mA and/or kV according to patient size and/or use of iterative reconstruction technique. COMPARISON:  CT 11/30/2020 FINDINGS: Lower chest: Emphysematous lung bases. Mild cardiomegaly. Small pericardial effusion, unchanged. Relative hypoattenuation of the cardiac blood pool indicative of anemia. Hepatobiliary: Sludge and stones again noted within the gallbladder. Gallbladder is anteriorly displaced by large renal cysts. No focal liver lesion or evidence of hepatic injury. No perihepatic hematoma. Pancreas: Grossly unremarkable. Spleen: Normal in size without focal abnormality. Adrenals/Urinary Tract: No adrenal nodule. Stable appearance of numerous large bilateral renal cysts, largest within the upper pole of the right kidney measuring at least 22 cm. Near-complete replacement of normal renal parenchyma  bilaterally. Large staghorn calculi in the left kidney, largest measuring up to 6.4 cm in diameter. No evidence of hydronephrosis. Urinary bladder is incompletely distended. Stomach/Bowel: Small hiatal hernia. No dil

## 2021-10-22 NOTE — Progress Notes (Signed)
Orthopedic Tech Progress Note ?Patient Details:  ?Rick [name] ?06/17/1928 ?[id number] ? ?Ortho Devices ?Type of Ortho Device: Ulna gutter splint, Cotton web roll ?Ortho Device/Splint Location: LUE ?Ortho Device/Splint Interventions: Ordered, Application ?  ?Post Interventions ?Patient Tolerated: Well ?Instructions Provided: Care of device ? ?[name] ?10/22/2021, 5:25 PM ? ?

## 2021-10-23 DIAGNOSIS — S3289XA Fracture of other parts of pelvis, initial encounter for closed fracture: Secondary | ICD-10-CM | POA: Diagnosis not present

## 2021-10-23 DIAGNOSIS — I10 Essential (primary) hypertension: Secondary | ICD-10-CM | POA: Diagnosis not present

## 2021-10-23 LAB — BASIC METABOLIC PANEL
Anion gap: 8 (ref 5–15)
BUN: 17 mg/dL (ref 8–23)
CO2: 23 mmol/L (ref 22–32)
Calcium: 8 mg/dL — ABNORMAL LOW (ref 8.9–10.3)
Chloride: 100 mmol/L (ref 98–111)
Creatinine, Ser: 2.57 mg/dL — ABNORMAL HIGH (ref 0.61–1.24)
GFR, Estimated: 23 mL/min — ABNORMAL LOW (ref >60)
Glucose, Bld: 128 mg/dL — ABNORMAL HIGH (ref 70–99)
Potassium: 3.8 mmol/L (ref 3.5–5.1)
Sodium: 131 mmol/L — ABNORMAL LOW (ref 135–145)

## 2021-10-23 LAB — CBC
HCT: 27.6 % — ABNORMAL LOW (ref 39.0–52.0)
Hemoglobin: 8.8 g/dL — ABNORMAL LOW (ref 13.0–17.0)
MCH: 26.8 pg (ref 26.0–34.0)
MCHC: 31.9 g/dL (ref 30.0–36.0)
MCV: 84.1 fL (ref 80.0–100.0)
Platelets: 236 10*3/uL (ref 150–400)
RBC: 3.28 MIL/uL — ABNORMAL LOW (ref 4.22–5.81)
RDW: 16.3 % — ABNORMAL HIGH (ref 11.5–15.5)
WBC: 10.3 10*3/uL (ref 4.0–10.5)
nRBC: 0 % (ref 0.0–0.2)

## 2021-10-23 MED ORDER — FUROSEMIDE 10 MG/ML IJ SOLN
40.0000 mg | Freq: Once | INTRAMUSCULAR | Status: AC
  Administered 2021-10-23: 40 mg via INTRAVENOUS
  Filled 2021-10-23: qty 4

## 2021-10-23 MED ORDER — SODIUM CHLORIDE 0.9% FLUSH
3.0000 mL | Freq: Two times a day (BID) | INTRAVENOUS | Status: DC
Start: 2021-10-23 — End: 2021-11-01
  Administered 2021-10-23 – 2021-10-31 (×16): 3 mL via INTRAVENOUS

## 2021-10-23 MED ORDER — SODIUM CHLORIDE 0.9% FLUSH
3.0000 mL | INTRAVENOUS | Status: DC | PRN
Start: 2021-10-23 — End: 2021-11-01
  Administered 2021-10-29: 3 mL via INTRAVENOUS

## 2021-10-23 MED ORDER — SODIUM CHLORIDE 0.9 % IV SOLN
250.0000 mL | INTRAVENOUS | Status: DC | PRN
Start: 2021-10-23 — End: 2021-11-01

## 2021-10-23 MED ORDER — POTASSIUM CHLORIDE 20 MEQ PO PACK
40.0000 meq | PACK | Freq: Once | ORAL | Status: AC
  Administered 2021-10-23: 40 meq via ORAL
  Filled 2021-10-23: qty 2

## 2021-10-23 NOTE — TOC CAGE-AID Note (Signed)
Transition of Care (TOC) - CAGE-AID Screening ? ? ?Patient Details  ?Name: Rick [name] ?MRN: [id number] ?Date of Birth: 28-Jul-1928 ? ?Transition of Care (TOC) CM/SW Contact:    ?Neidy Guerrieri C Tarpley-[name], LCSWA ?Phone Number: ?10/23/2021, 1:25 PM ? ? ?Clinical Narrative: ?Pt participated in [place].  Pts family stated pt does smoke cigarettes and drinks ETOH.  Pt was offered resources, due to usage of substance and ETOH.    ? ?Passenger transport manager, MSW, LCSW-A ?Pronouns:  She/Her/Hers ?[facility] ?Clinical Social Worker ?Direct Number:  [phone] ?[name] ? ?CAGE-AID Screening: ?  ? ?Have You Ever Felt You Ought to Cut Down on Your Drinking or Drug Use?: No ?Have People Annoyed You By Critizing Your Drinking Or Drug Use?: Yes ?Have You Felt Bad Or Guilty About Your Drinking Or Drug Use?: No ?Have You Ever Had a Drink or Used Drugs First Thing In The Morning to Steady Your Nerves or to Get Rid of a Hangover?: No ?CAGE-AID Score: 1 ? ?Substance Abuse Education Offered: Yes ? ?Substance abuse interventions: Educational Materials ? ? ? ? ? ? ?

## 2021-10-23 NOTE — Progress Notes (Signed)
Pt. Was cooperative with therapy and was motivated to get out of bed. Pt. Is able to follow directions and is oriented. Pt. Has supportive family that can assist at dc. Pt. Would benefit form cir prior to dc home. If pt. Is not able to go to cir than st snf recommended.  ?  ? 10/23/21 1100  ?OT Visit Information  ?Last OT Received On 10/23/21  ?Assistance Needed +2  ?PT/OT/SLP Co-Evaluation/Treatment Yes  ?Reason for Co-Treatment Complexity of the patient's impairments (multi-system involvement);For patient/therapist safety;To address functional/ADL transfers  ?OT goals addressed during session ADL's and self-care  ?History of Present Illness Rick [name] is a 88 y.o. male with a history of hypertension, hyperlipidemia, GERD, hypothyroidism, renal cysts with CKD and a remote history of prostate cancer status post prostatectomy who presented to the ED after he was working on his tractor and it ran over him.  Pt. with multiple pelvic fx and l hand 5th martarsal fx.  ?Precautions  ?Precautions Fall  ?Required Braces or Orthoses Splint/Cast  ?Restrictions  ?Weight Bearing Restrictions Yes  ?LUE Weight Bearing NWB  ?Other Position/Activity Restrictions no weightbearing on l hand. PT has issued platform walker.  ?Home Living  ?Family/patient expects to be discharged to: Private residence  ?Living Arrangements Spouse/significant other  ?Available Help at Discharge Family;Available 24 hours/day  ?Type of Home House  ?Home Access Stairs to enter  ?Entrance Stairs-Number of Steps 2  ?Entrance Stairs-Rails Right  ?Home Layout One level  ?Bathroom Shower/Tub Tub/shower unit  ?Bathroom Toilet Handicapped height  ?[place] - single point  ?Prior Function  ?Prior Level of Function  Independent/Modified Independent  ?Mobility Comments amb with spc  ?ADLs Comments i to mod i  ?Communication  ?Communication HOH  ?Pain Assessment  ?Pain Assessment 0-10  ?Pain Score 7  ?Pain Location pelvis  ?Pain Descriptors / Indicators  Aching  ?Pain Intervention(s) Premedicated before session  ?Cognition  ?Arousal/Alertness Awake/alert  ?Behavior During Therapy [facility] for tasks assessed/performed  ?Overall Cognitive Status Within Functional Limits for tasks assessed  ?Upper Extremity Assessment  ?Upper Extremity Assessment LUE deficits/detail  ?LUE Deficits / Details wrist is splinted with 4th and 5th digits secured as well  ?LUE Sensation WNL  ?LUE Coordination  ?(splint interfers with rom of l hand.)  ?Lower Extremity Assessment  ?Lower Extremity Assessment Defer to PT evaluation  ?Vision- History  ?Baseline Vision/History 1 Wears glasses;3 Glaucoma  ?Ability to See in Adequate Light  ?(Pt. does not have glasses here so unable to test)  ?Patient Visual Report No change from baseline  ?Vision- Assessment  ?Vision Assessment? No apparent visual deficits  ?ADL  ?Overall ADL's  Needs assistance/impaired  ?Eating/Feeding Set up;Sitting  ?Grooming Wash/dry hands;Wash/dry face;Oral care;Minimal assistance;Sitting  ?Upper Body Bathing Moderate assistance;Sitting  ?Lower Body Bathing Total assistance;+2 for safety/equipment;+2 for physical assistance;Sit to/from stand  ?Upper Body Dressing  Maximal assistance;Sitting  ?Lower Body Dressing Total assistance;+2 for physical assistance;+2 for safety/equipment;Sit to/from stand  ?Toilet Transfer Total assistance  ?Toileting- Clothing Manipulation and Hygiene Total assistance;Sit to/from stand;+2 for physical assistance;+2 for safety/equipment  ?Functional mobility during ADLs Maximal assistance;+2 for physical assistance;+2 for safety/equipment  ?General ADL Comments Pt. is requiring 2 person assist currently.  ?Bed Mobility  ?Overal bed mobility Needs Assistance  ?Bed Mobility Supine to Sit  ?Supine to sit Mod assist;+2 for physical assistance  ?Transfers  ?Overall transfer level Needs assistance  ?Equipment used 2 person hand held assist  ?Transfers Sit to/from Stand;Bed to chair/wheelchair/BSC  ?Sit  to Stand  Max assist;Total assist  ?Bed to/from chair/wheelchair/BSC transfer type: Squat pivot  ?Squat pivot transfers Max assist;+2 physical assistance  ?Balance  ?Sitting balance-Leahy Scale Good  ?Standing balance-Leahy Scale Poor  ?OT - End of Session  ?Equipment Utilized During [employer] walker (2 wheels)  ?Activity Tolerance Patient tolerated treatment well  ?Patient left in chair;with family/visitor present  ?Nurse Communication  ?(ok therapy)  ?OT Assessment  ?OT Recommendation/Assessment Patient needs continued OT Services  ?OT Visit Diagnosis Unsteadiness on feet (R26.81)  ?OT Problem List Decreased activity tolerance;Decreased range of motion;Decreased knowledge of precautions;Impaired UE functional use  ?OT Plan  ?OT Frequency (ACUTE ONLY) Min 2X/week  ?OT Treatment/Interventions (ACUTE ONLY) Self-care/ADL training;DME and/or AE instruction;Therapeutic activities;Patient/family education  ?AM-PAC OT "6 Clicks" Daily Activity Outcome Measure (Version 2)  ?Help from another person eating meals? 3  ?Help from another person taking care of personal grooming? 3  ?Help from another person toileting, which includes using toliet, bedpan, or urinal? 2  ?Help from another person bathing (including washing, rinsing, drying)? 2  ?Help from another person to put on and taking off regular upper body clothing? 2  ?Help from another person to put on and taking off regular lower body clothing? 1  ?6 Click Score 13  ?Progressive Mobility  ?What is the highest level of mobility based on the progressive mobility assessment? Level 4 (Walks with assist in room) - Balance while marching in place and cannot step forward and back - Complete  ?Activity Transferred from bed to chair  ?OT Recommendation  ?Recommendations for Other Services Rehab consult  ?Follow Up Recommendations Acute inpatient rehab (3hours/day)  ?Assistance recommended at discharge Frequent or constant Supervision/Assistance  ?Patient can return home with the  following A lot of help with walking and/or transfers;A lot of help with bathing/dressing/bathroom  ?Functional Status Assessent Patient has had a recent decline in their functional status and demonstrates the ability to make significant improvements in function in a reasonable and predictable amount of time.  ?OT Equipment  ?(to be determined at dc location)  ?Individuals Consulted  ?Consulted and Agree with Results and Recommendations Patient  ?Acute Rehab OT Goals  ?Patient Stated Goal to get better  ?OT Goal Formulation With patient  ?Time For Goal Achievement 11/06/21  ?Potential to Achieve Goals Good  ?OT Time Calculation  ?OT Start Time (ACUTE ONLY) 1100  ?OT Stop Time (ACUTE ONLY) 1130  ?OT Time Calculation (min) 30 min  ?OT General Charges  ?$OT Visit 1 Visit  ?OT Evaluation  ?$OT Eval Moderate Complexity 1 Mod  ?Written Expression  ?Dominant Hand Right  ?Reece Packer OT/L ? ?

## 2021-10-23 NOTE — Care Management Obs Status (Signed)
MEDICARE OBSERVATION STATUS NOTIFICATION ? ? ?Patient Details  ?Name: Rick [name] ?MRN: [id number] ?Date of Birth: Jan 05, 1929 ? ? ?Medicare Observation Status Notification Given:  Yes ? ? ? ?Carles Collet, RN ?10/23/2021, 3:09 PM ?

## 2021-10-23 NOTE — Progress Notes (Signed)
? ?Progress Note ? ?   ?Subjective: ?Rick [name] is not complaining of much pain at this time besides when he has to be moved during ADL care. He admits to having some nausea last night but it has now subsided. Wife present at bedside and updated on plan of care.  ? ?Objective: ?Vital signs in last 24 hours: ?Temp:  [97.7 ?F (36.5 ?C)-98.2 ?F (36.8 ?C)] 97.7 ?F (36.5 ?C) (05/17 0801) ?Pulse Rate:  [60-86] 73 (05/17 0801) ?Resp:  [12-24] 17 (05/17 0801) ?BP: (124-162)/(68-93) 144/79 (05/17 0801) ?SpO2:  [89 %-99 %] 95 % (05/17 0801) ?Weight:  [68 kg] 68 kg (05/16 1333) ?Last BM Date : 10/22/21 ? ?Intake/Output from previous day: ?05/16 0701 - 05/17 0700 ?In: 747.3 [I.V.:747.3] ?Out: 37 [Urine:900; Emesis/NG output:30] ?Intake/Output this shift: ?Total I/O ?In: -  ?Out: 600 [Urine:600] ? ?PE: ?General: pleasant, WD, elderly male who is laying in bed in NAD and appears stated age ?Heart: regular, rate, and rhythm.  Normal s1,s2. No obvious murmurs, gallops, or rubs noted.  Palpable radial pulses bilaterally, 3+ pitting edema in BLE R>L ?Lungs: CTAB, no wheezes, rhonchi, or rales noted.  Respiratory effort nonlabored ?Abd: Soft, NT/ND, +BS, no masses, hernias, or organomegaly ?MS: left hand edema with abrasion over lateral dorsum in ulnar gutter, L fingers NVI; edema of BLE, BLE NVI with some decreased sensation in the left. No bruising noted to the pelvic region ?Skin: warm and dry with no masses, lesions, or rashes ? ? ?Lab Results:  ?Recent Labs  ?  10/22/21 ?1331 10/22/21 ?1341 10/23/21 ?0308  ?WBC 8.4  --  10.3  ?HGB 8.4* 9.2* 8.8*  ?HCT 27.0* 27.0* 27.6*  ?PLT 275  --  236  ? ?BMET ?Recent Labs  ?  10/22/21 ?1331 10/22/21 ?1341 10/23/21 ?0308  ?NA 134* 135 131*  ?K 3.5 3.6 3.8  ?CL 102 99 100  ?CO2 25  --  23  ?GLUCOSE 87 86 128*  ?BUN '18 18 17  '$ ?CREATININE 2.84* 3.00* 2.57*  ?CALCIUM 8.0*  --  8.0*  ? ?PT/INR ?No results for input(s): LABPROT, INR in the last 72 hours. ?CMP  ?   ?Component Value Date/Time  ? NA 131  (L) 10/23/2021 0308  ? NA 128 (L) 08/19/2021 1600  ? K 3.8 10/23/2021 0308  ? CL 100 10/23/2021 0308  ? CO2 23 10/23/2021 0308  ? GLUCOSE 128 (H) 10/23/2021 0308  ? BUN 17 10/23/2021 0308  ? BUN 20 08/19/2021 1600  ? CREATININE 2.57 (H) 10/23/2021 0308  ? CALCIUM 8.0 (L) 10/23/2021 0308  ? PROT 6.8 10/22/2021 1331  ? PROT 8.2 08/19/2021 1600  ? ALBUMIN 1.9 (L) 10/22/2021 1331  ? ALBUMIN 3.0 (L) 08/19/2021 1600  ? AST 15 10/22/2021 1331  ? ALT 11 10/22/2021 1331  ? ALKPHOS 59 10/22/2021 1331  ? BILITOT 0.4 10/22/2021 1331  ? BILITOT 0.4 08/19/2021 1600  ? GFRNONAA 23 (L) 10/23/2021 0308  ? GFRAA 49 (L) 01/11/2015 2208  ? ?Lipase  ?   ?Component Value Date/Time  ? LIPASE 37 11/30/2020 1107  ? ? ? ? ? ?Studies/Results: ?CT Abdomen Pelvis Wo Contrast ? ?Result Date: 10/22/2021 ?CLINICAL DATA:  Blunt trauma.  Tractor ran over pelvis area EXAM: CT ABDOMEN AND PELVIS WITHOUT CONTRAST TECHNIQUE: Multidetector CT imaging of the abdomen and pelvis was performed following the standard protocol without IV contrast. RADIATION DOSE REDUCTION: This exam was performed according to the departmental dose-optimization program which includes automated exposure control, adjustment of the mA and/or  kV according to patient size and/or use of iterative reconstruction technique. COMPARISON:  CT 11/30/2020 FINDINGS: Lower chest: Emphysematous lung bases. Mild cardiomegaly. Small pericardial effusion, unchanged. Relative hypoattenuation of the cardiac blood pool indicative of anemia. Hepatobiliary: Sludge and stones again noted within the gallbladder. Gallbladder is anteriorly displaced by large renal cysts. No focal liver lesion or evidence of hepatic injury. No perihepatic hematoma. Pancreas: Grossly unremarkable. Spleen: Normal in size without focal abnormality. Adrenals/Urinary Tract: No adrenal nodule. Stable appearance of numerous large bilateral renal cysts, largest within the upper pole of the right kidney measuring at least 22 cm.  Near-complete replacement of normal renal parenchyma bilaterally. Large staghorn calculi in the left kidney, largest measuring up to 6.4 cm in diameter. No evidence of hydronephrosis. Urinary bladder is incompletely distended. Stomach/Bowel: Small hiatal hernia. No dilated loops of bowel. Colonic diverticulosis. No evidence of active bowel inflammation, although evaluation is limited in the absence of oral or IV contrast. Vascular/Lymphatic: Aortic atherosclerosis. No enlarged abdominal or pelvic lymph nodes. Reproductive: Prior prostatectomy. Other: Mild presacral edema is unchanged. No intra-abdominal or pelvic hematoma or evidence of hemoperitoneum. No pneumoperitoneum. Small fat containing right inguinal hernia. Musculoskeletal: Bilateral pubic bone fractures including involvement of the left parasymphyseal pubic bone with left inferior pubic ramus and left pubic root fracture. Mildly displaced right inferior pubic ramus fracture and nondisplaced right pubic root fracture. Pubic symphysis intact without diastasis. Bilateral SI joints intact without diastasis. Bilateral hip joints intact. No fracture of the proximal femurs. Lumbar vertebral body heights are maintained. Degenerative grade 1 anterolisthesis of L4 on L5 is unchanged. No appreciable soft tissue hematoma. IMPRESSION: 1. Acute multiple bilateral pubic bone fractures, as described above. 2. No evidence of acute traumatic solid organ injury to the abdomen or pelvis. 3. Stable appearance of numerous large bilateral renal cysts, largest within the upper pole of the right kidney measuring at least 22 cm. Large staghorn calculi in the left kidney, largest measuring up to 6.4 cm in diameter. No evidence of hydronephrosis. 4. Sludge and stones within the gallbladder. 5. Colonic diverticulosis without evidence of active bowel inflammation. 6. Small pericardial effusion, unchanged. Aortic Atherosclerosis (ICD10-I70.0) and Emphysema (ICD10-J43.9). Electronically  Signed   By: Davina Poke D.O.   On: 10/22/2021 14:08  ? ?DG Pelvis Portable ? ?Result Date: 10/22/2021 ?CLINICAL DATA:  Run over by a tractor.  Pelvic and abdominal pain. EXAM: PORTABLE PELVIS 1-2 VIEWS COMPARISON:  None Available. FINDINGS: No displaced fracture or dislocation. Diffuse osteopenia limits evaluation. Multiple surgical clips overlying the pelvic cavity. Hyperdense foreign body projecting over the proximal right femur. IMPRESSION: No definite evidence of acute displaced fracture or dislocation. Diffuse osteopenia limits evaluation. Cross-sectional imaging could be obtained for further evaluation if clinically warranted. Electronically Signed   By: Keane Police D.O.   On: 10/22/2021 13:43  ? ?DG Chest Port 1 View ? ?Result Date: 10/22/2021 ?CLINICAL DATA:  Ran over by a tractor. EXAM: PORTABLE CHEST 1 VIEW COMPARISON:  None Available. FINDINGS: The heart is enlarged. Atherosclerotic calcification of the aortic arch. Lungs are clear without evidence of focal consolidation or pleural effusion. No appreciable pneumothorax. No acute displaced fracture. IMPRESSION: No active disease. Electronically Signed   By: Keane Police D.O.   On: 10/22/2021 13:45  ? ?DG Hand Complete Left ? ?Result Date: 10/22/2021 ?CLINICAL DATA:  Tractor injury to the left hand EXAM: LEFT HAND - COMPLETE 3+ VIEW COMPARISON:  None Available. FINDINGS: Comminuted nondisplaced non articular distal metadiaphysis fracture in the left fifth  metacarpal with surrounding soft tissue swelling. No dislocation. No suspicious focal osseous lesions. Moderate osteoarthritis in the interphalangeal joint left thumb. Mild first MCP joint osteoarthritis. Chondrocalcinosis in the triangular fibrocartilage in the left wrist. No radiopaque foreign bodies. IMPRESSION: 1. Comminuted nondisplaced non articular distal left fifth metacarpal fracture with surrounding soft tissue swelling. 2. 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  Mild-to-moderate polyarticular first ray osteoarthritis in the left hand. Electronically Signed   By: Ilona Sorrel M.D.   On: 10/22/2021 15:10   ? ?Anti-infectives: ?Anti-infectives (From admission, onward)

## 2021-10-23 NOTE — Evaluation (Signed)
Physical Therapy Evaluation ?Patient Details ?Name: Rick [name] ?MRN: [id number] ?DOB: [birth year] ?Today's Date: 10/23/2021 ? ?History of Present Illness ? Pt is a 88 y.o. M who presents 10/22/2021 after being run over by tractor with pelvic fractures, L 5th MCP fx. Significant PMH: prostate CA, HLD.  ?Clinical Impression ? PTA, pt lives with his spouse and is modI with ADL's and mobility using a cane. Pt reports his two sons will also be assisting upon discharge. Asked RN to premedicate pt prior to evaluation. Pt with + vomiting prior to session; he reports he has not eaten yet. Pt overall motivated and agreeable to participate. Requiring two person mod-max assist for bed mobility and pivot transfers to chair. Will initiate gait and stair training next session using L platform RW. Suspect good progress. Would benefit from post acute rehab to address deficits and maximize functional mobility. ?   ? ?Recommendations for follow up therapy are one component of a multi-disciplinary discharge planning process, led by the attending physician.  Recommendations may be updated based on patient status, additional functional criteria and insurance authorization. ? ?Follow Up Recommendations Acute inpatient rehab (3hours/day) ? ?  ?Assistance Recommended at Discharge Frequent or constant Supervision/Assistance  ?Patient can return home with the following ? A lot of help with walking and/or transfers;A lot of help with bathing/dressing/bathroom;Assistance with cooking/housework;Assist for transportation;Help with stairs or ramp for entrance ? ?  ?Equipment Recommendations Other (comment);BSC/3in1;Wheelchair (measurements PT);Wheelchair cushion (measurements PT) (L platform RW)  ?Recommendations for Other Services ? Rehab consult  ?  ?Functional Status Assessment Patient has had a recent decline in their functional status and demonstrates the ability to make significant improvements in function in a reasonable and predictable  amount of time.  ? ?  ?Precautions / Restrictions Precautions ?Precautions: Fall ?Required Braces or Orthoses: Splint/Cast ?Restrictions ?Weight Bearing Restrictions: Yes ?LUE Weight Bearing: Weight bear through elbow only ?RLE Weight Bearing: Weight bearing as tolerated ?LLE Weight Bearing: Weight bearing as tolerated ?Other Position/Activity Restrictions: no weightbearing on l hand. PT has issued platform walker.  ? ?  ? ?Mobility ? Bed Mobility ?Overal bed mobility: Needs Assistance ?Bed Mobility: Supine to Sit ?  ?  ?Supine to sit: Mod assist, +2 for physical assistance ?  ?  ?General bed mobility comments: Cues for initiating BLE movement to edge of bed, use of bed pad to assist hips to edge of bed, assist at trunk ?  ? ?Transfers ?Overall transfer level: Needs assistance ?Equipment used: 2 person hand held assist ?Transfers: Sit to/from Stand, Bed to chair/wheelchair/BSC ?Sit to Stand: Max assist, +2 physical assistance ?  ?  ?Squat pivot transfers: Max assist, +2 physical assistance ?  ?  ?General transfer comment: Pt standing from edge of bed with maxA + 2 and left knee block, unable to weight shift to take steps so performed squat pivot towards left from bed to chair, using bed pad to guide hips ?  ? ?Ambulation/Gait ?  ?  ?  ?  ?  ?  ?  ?  ? ?Stairs ?  ?  ?  ?  ?  ? ?Wheelchair Mobility ?  ? ?Modified Rankin (Stroke Patients Only) ?  ? ?  ? ?Balance Overall balance assessment: Needs assistance ?Sitting-balance support: Feet supported ?Sitting balance-Leahy Scale: Good ?  ?  ?Standing balance support: Bilateral upper extremity supported ?Standing balance-Leahy Scale: Poor ?Standing balance comment: reliant on external support by PT ?  ?  ?  ?  ?  ?  ?  ?  ?  ?  ?  ?   ? ? ? ?  Pertinent Vitals/Pain Pain Assessment ?Pain Assessment: Faces ?Faces Pain Scale: Hurts whole lot ?Pain Location: pelvis with movement (particularly on L ) ?Pain Descriptors / Indicators: Aching ?Pain Intervention(s): Limited activity  within patient's tolerance, Monitored during session, Premedicated before session  ? ? ?Home Living Family/patient expects to be discharged to:: Private residence ?Living Arrangements: Spouse/significant other ?Available Help at Discharge: Family;Available 24 hours/day ?Type of Home: House ?Home Access: Stairs to enter ?Entrance Stairs-Rails: Right ?Entrance Stairs-Number of Steps: 2 ?  ?Home Layout: One level ?Home Equipment: Kasandra Knudsen - single point ?   ?  ?Prior Function Prior Level of Function : Independent/Modified Independent ?  ?  ?  ?  ?  ?  ?Mobility Comments: amb with spc ?ADLs Comments: independent/modI ?  ? ? ?Hand Dominance  ? Dominant Hand: Right ? ?  ?Extremity/Trunk Assessment  ? Upper Extremity Assessment ?Upper Extremity Assessment: LUE deficits/detail ?LUE Deficits / Details: wrist is splinted with 4th and 5th digits secured as well ?LUE Sensation: WNL ?LUE Coordination:  (splint interfers with rom of l hand.) ?  ? ?Lower Extremity Assessment ?Lower Extremity Assessment: RLE deficits/detail;LLE deficits/detail ?RLE Deficits / Details: At least 3/5 strength ?LLE Deficits / Details: Grossly 2/5 strength ?  ? ?   ?Communication  ? Communication: HOH  ?Cognition Arousal/Alertness: Awake/alert ?Behavior During Therapy: [facility] for tasks assessed/performed ?Overall Cognitive Status: Within Functional Limits for tasks assessed ?  ?  ?  ?  ?  ?  ?  ?  ?  ?  ?  ?  ?  ?  ?  ?  ?  ?  ?  ? ?  ?General Comments General comments (skin integrity, edema, etc.): SpO2 90% on 1L O2 ? ?  ?Exercises    ? ?Assessment/Plan  ?  ?PT Assessment Patient needs continued PT services  ?PT Problem List Decreased strength;Decreased activity tolerance;Decreased balance;Decreased mobility;Pain ? ?   ?  ?PT Treatment Interventions DME instruction;Gait training;Stair training;Therapeutic activities;Functional mobility training;Therapeutic exercise;Balance training;Patient/family education;Wheelchair mobility training   ? ?PT Goals (Current  goals can be found in the Care Plan section)  ?Acute Rehab PT Goals ?Patient Stated Goal: to get out of bed ?PT Goal Formulation: With patient/family ?Time For Goal Achievement: 11/06/21 ?Potential to Achieve Goals: Good ? ?  ?Frequency Min 5X/week ?  ? ? ?Co-evaluation PT/OT/SLP Co-Evaluation/Treatment: Yes ?Reason for Co-Treatment: For patient/therapist safety;To address functional/ADL transfers ?PT goals addressed during session: Mobility/safety with mobility ?OT goals addressed during session: ADL's and self-care ?  ? ? ?  ?AM-PAC PT "6 Clicks" Mobility  ?Outcome Measure Help needed turning from your back to your side while in a flat bed without using bedrails?: A Lot ?Help needed moving from lying on your back to sitting on the side of a flat bed without using bedrails?: A Lot ?Help needed moving to and from a bed to a chair (including a wheelchair)?: A Lot ?Help needed standing up from a chair using your arms (e.g., wheelchair or bedside chair)?: A Lot ?Help needed to walk in hospital room?: Total ?Help needed climbing 3-5 steps with a railing? : Total ?6 Click Score: 10 ? ?  ?End of Session Equipment Utilized During Treatment: Gait belt;Oxygen ?Activity Tolerance: Patient tolerated treatment well ?Patient left: in chair;with call bell/phone within reach;with family/visitor present ?Nurse Communication: Mobility status ?PT Visit Diagnosis: Pain;Difficulty in walking, not elsewhere classified (R26.2) ?Pain - part of body:  (pelvis) ?  ? ?Time: 1100-1136 ?PT Time Calculation (min) (ACUTE ONLY): 36 min ? ? ?  Charges:   PT Evaluation ?$PT Eval Moderate Complexity: 1 Mod ?  ?  ?   ? ? ?Wyona Almas, PT, DPT ?Acute Rehabilitation Services ?Pager [phone] ?Office [phone] ? ? ?Carloine Margo Aye ?10/23/2021, 1:51 PM ? ?

## 2021-10-24 DIAGNOSIS — S3289XA Fracture of other parts of pelvis, initial encounter for closed fracture: Secondary | ICD-10-CM | POA: Diagnosis not present

## 2021-10-24 DIAGNOSIS — I10 Essential (primary) hypertension: Secondary | ICD-10-CM | POA: Diagnosis not present

## 2021-10-24 LAB — BASIC METABOLIC PANEL
Anion gap: 7 (ref 5–15)
BUN: 18 mg/dL (ref 8–23)
CO2: 25 mmol/L (ref 22–32)
Calcium: 8.1 mg/dL — ABNORMAL LOW (ref 8.9–10.3)
Chloride: 96 mmol/L — ABNORMAL LOW (ref 98–111)
Creatinine, Ser: 2.54 mg/dL — ABNORMAL HIGH (ref 0.61–1.24)
GFR, Estimated: 23 mL/min — ABNORMAL LOW (ref >60)
Glucose, Bld: 133 mg/dL — ABNORMAL HIGH (ref 70–99)
Potassium: 3.8 mmol/L (ref 3.5–5.1)
Sodium: 128 mmol/L — ABNORMAL LOW (ref 135–145)

## 2021-10-24 LAB — CBC
HCT: 32.1 % — ABNORMAL LOW (ref 39.0–52.0)
Hemoglobin: 10.2 g/dL — ABNORMAL LOW (ref 13.0–17.0)
MCH: 27.1 pg (ref 26.0–34.0)
MCHC: 31.8 g/dL (ref 30.0–36.0)
MCV: 85.1 fL (ref 80.0–100.0)
Platelets: 244 10*3/uL (ref 150–400)
RBC: 3.77 MIL/uL — ABNORMAL LOW (ref 4.22–5.81)
RDW: 16.7 % — ABNORMAL HIGH (ref 11.5–15.5)
WBC: 12.1 10*3/uL — ABNORMAL HIGH (ref 4.0–10.5)
nRBC: 0 % (ref 0.0–0.2)

## 2021-10-24 LAB — MAGNESIUM: Magnesium: 1.9 mg/dL (ref 1.7–2.4)

## 2021-10-24 MED ORDER — SODIUM CHLORIDE 1 G PO TABS
1.0000 g | ORAL_TABLET | Freq: Three times a day (TID) | ORAL | Status: DC
  Administered 2021-10-24 – 2021-10-25 (×3): 1 g via ORAL
  Filled 2021-10-24 (×4): qty 1

## 2021-10-24 MED ORDER — POLYETHYLENE GLYCOL 3350 17 G PO PACK
17.0000 g | PACK | Freq: Every day | ORAL | Status: DC
  Administered 2021-10-24: 17 g via ORAL
  Filled 2021-10-24: qty 1

## 2021-10-24 MED ORDER — ENSURE ENLIVE PO LIQD
237.0000 mL | Freq: Two times a day (BID) | ORAL | Status: DC
  Administered 2021-10-24 – 2021-11-01 (×8): 237 mL via ORAL

## 2021-10-24 NOTE — Progress Notes (Signed)
Physical Therapy Treatment Patient Details Name: Rick [name] MRN: [id number] DOB: [birth year] Today's Date: 10/24/2021   History of Present Illness Pt is a 88 y.o. M who presents 10/22/2021 after being run over by tractor with pelvic fractures, L 5th MCP fx. Significant PMH: prostate CA, HLD.    PT Comments    Pt received supine and agreeable to session with slow but steady progress towards goals with focus on OOB mobility with platform walker. Pt able to come to sitting EOB with mod assist +2 for BLE management and to elevate trunk secondary to WB precautions of LUE. Pt requiring significant cueing to come to standing with platform walker for hand placement but able to rise with min assist +2 to steady. Pt able to tolerate standing ~5 mins for platform walker adjustment with light cues throughout for upright trunk and forward gaze. Pt demonstrating ability to ambulate short distance from EOB to recliner with +2 assist for safety and mod assist to advance RW and for cues for sequencing. Pt with some instances of knee buckling secondary to weakness and pain but pt able to correct. Pt continues to benefit from skilled PT services to progress toward functional mobility goals.     Recommendations for follow up therapy are one component of a multi-disciplinary discharge planning process, led by the attending physician.  Recommendations may be updated based on patient status, additional functional criteria and insurance authorization.  Follow Up Recommendations  Acute inpatient rehab (3hours/day)     Assistance Recommended at Discharge Frequent or constant Supervision/Assistance  Patient can return home with the following A lot of help with walking and/or transfers;A lot of help with bathing/dressing/bathroom;Assistance with cooking/housework;Assist for transportation;Help with stairs or ramp for entrance   Equipment Recommendations  Other (comment);BSC/3in1;Wheelchair (measurements PT);Wheelchair  cushion (measurements PT) (L platform RW)    Recommendations for Other Services Rehab consult     Precautions / Restrictions Precautions Precautions: Fall Required Braces or Orthoses: Splint/Cast Restrictions Weight Bearing Restrictions: Yes LUE Weight Bearing: Weight bear through elbow only RLE Weight Bearing: Weight bearing as tolerated LLE Weight Bearing: Weight bearing as tolerated Other Position/Activity Restrictions: no weightbearing on L hand. PT has issued platform walker.     Mobility  Bed Mobility Overal bed mobility: Needs Assistance Bed Mobility: Supine to Sit     Supine to sit: Mod assist, +2 for physical assistance     General bed mobility comments: Cues for initiating BLE movement to edge of bed, use of bed pad to assist hips to edge of bed, assist at trunk    Transfers Overall transfer level: Needs assistance Equipment used: Left platform walker Transfers: Sit to/from Stand, Bed to chair/wheelchair/BSC Sit to Stand: Min assist, +2 safety/equipment           General transfer comment: Pt standing from edge of bed with minA + 2, heavy cues for hand placement    Ambulation/Gait Ambulation/Gait assistance: Mod assist, +2 safety/equipment Gait Distance (Feet): 5 Feet Assistive device: Left platform walker Gait Pattern/deviations: Step-to pattern, Knee flexed in stance - right, Knee flexed in stance - left, Antalgic Gait velocity: decr     General Gait Details: pt able to take steps foward with mod assist for cues and +2 for safety as pt with noted knee instability, knees buckling during stance but pt able to correct   Stairs             Wheelchair Mobility    Modified Rankin (Stroke Patients Only)  Balance Overall balance assessment: Needs assistance Sitting-balance support: Feet supported Sitting balance-Leahy Scale: Good     Standing balance support: Bilateral upper extremity supported Standing balance-Leahy Scale:  Poor Standing balance comment: reliant on external support by of platform walker, pt able to tolerate extended time standing for platform walker adjustment                            Cognition Arousal/Alertness: Awake/alert Behavior During Therapy: WFL for tasks assessed/performed Overall Cognitive Status: Within Functional Limits for tasks assessed                                          Exercises General Exercises - Lower Extremity Ankle Circles/Pumps: Both, 20 reps Long Arc Quad: Both, 10 reps Hip ABduction/ADduction: Both, 10 reps Hip Flexion/Marching: Both, 10 reps    General Comments General comments (skin integrity, edema, etc.): VSS on RA      Pertinent Vitals/Pain Pain Assessment Pain Assessment: Faces Faces Pain Scale: Hurts whole lot Pain Location: pelvis with movement (particularly on L ) Pain Descriptors / Indicators: Aching Pain Intervention(s): Limited activity within patient's tolerance, Monitored during session, Repositioned    Home Living                          Prior Function            PT Goals (current goals can now be found in the care plan section) Acute Rehab PT Goals Patient Stated Goal: to get out of bed PT Goal Formulation: With patient/family Time For Goal Achievement: 11/06/21    Frequency    Min 5X/week      PT Plan      Co-evaluation              AM-PAC PT "6 Clicks" Mobility   Outcome Measure  Help needed turning from your back to your side while in a flat bed without using bedrails?: A Lot Help needed moving from lying on your back to sitting on the side of a flat bed without using bedrails?: A Lot Help needed moving to and from a bed to a chair (including a wheelchair)?: A Lot Help needed standing up from a chair using your arms (e.g., wheelchair or bedside chair)?: A Lot Help needed to walk in hospital room?: A Lot Help needed climbing 3-5 steps with a railing? : Total 6  Click Score: 11    End of Session Equipment Utilized During Treatment: Gait belt Activity Tolerance: Patient tolerated treatment well Patient left: in chair;with call bell/phone within reach;with family/visitor present Nurse Communication: Mobility status PT Visit Diagnosis: Pain;Difficulty in walking, not elsewhere classified (R26.2) Pain - part of body:  (pelvis)     Time: 8768-1157 PT Time Calculation (min) (ACUTE ONLY): 27 min  Charges:  $Gait Training: 8-22 mins $Therapeutic Exercise: 8-22 mins                     Shadrach Bartunek R. PTA Acute Rehabilitation Services Office: [place] 10/24/2021, 12:35 PM

## 2021-10-24 NOTE — Plan of Care (Signed)
  Problem: Clinical Measurements: Goal: Diagnostic test results will improve Outcome: Progressing   Problem: Activity: Goal: Risk for activity intolerance will decrease Outcome: Progressing   Problem: Nutrition: Goal: Adequate nutrition will be maintained Outcome: Progressing   Problem: Pain Managment: Goal: General experience of comfort will improve Outcome: Progressing   

## 2021-10-24 NOTE — Progress Notes (Signed)
Inpatient Rehabilitation Admissions Coordinator   I met at bedside with patient and his daughter, [name]. We dicussed goals ne expectations of a possible Cir admit.Patient was independent and active only using a cane prn pta. They prefer Cir admit. I will begin Auth with [facility] for a possible admit.  Danne Baxter, RN, MSN Rehab Admissions Coordinator [phone] 10/24/2021 11:56 AM

## 2021-10-24 NOTE — Progress Notes (Addendum)
Patient ID: Rick [name], male   DOB: [birth year], 88 y.o.   MRN: [id number] [facility] Surgery Progress Note     Subjective: CC-  Family at bedside. Overall doing well. States that pain is well controlled on scheduled tylenol and he only took oxy once yesterday. Slept well last night. Tolerating diet without n/v. Passing flatus, no BM.  Did well with therapies yesterday - recommending CIR. Denies noting any new injuries.  Objective: Vital signs in last 24 hours: Temp:  [97.5 F (36.4 C)-97.9 F (36.6 C)] 97.8 F (36.6 C) (05/18 0804) Pulse Rate:  [71-88] 88 (05/18 0804) Resp:  [17-18] 18 (05/18 0804) BP: (121-149)/(69-89) 135/89 (05/18 0804) SpO2:  [90 %-96 %] 95 % (05/18 0804) Last BM Date : 10/21/21 (per patient and wife)  Intake/Output from previous day: 05/17 0701 - 05/18 0700 In: 240 [P.O.:240] Out: 1850 [Urine:1850] Intake/Output this shift: Total I/O In: 120 [P.O.:120] Out: -   PE: Gen:  Alert, NAD, pleasant Card:  RRR Pulm:  CTAB, no W/R/R, rate and effort normal on 1L [place], pulled 750 on IS Abd: Soft, NT/ND Ext:  ulnar gutter splint to LUE, fingers WWP with trace edema. calves soft and nontender, 1-2+ edema BLE  Lab Results:  Recent Labs    10/22/21 1331 10/22/21 1341 10/23/21 0308  WBC 8.4  --  10.3  HGB 8.4* 9.2* 8.8*  HCT 27.0* 27.0* 27.6*  PLT 275  --  236   BMET Recent Labs    10/22/21 1331 10/22/21 1341 10/23/21 0308  NA 134* 135 131*  K 3.5 3.6 3.8  CL 102 99 100  CO2 25  --  23  GLUCOSE 87 86 128*  BUN '18 18 17  '$ CREATININE 2.84* 3.00* 2.57*  CALCIUM 8.0*  --  8.0*   PT/INR No results for input(s): LABPROT, INR in the last 72 hours. CMP     Component Value Date/Time   NA 131 (L) 10/23/2021 0308   NA 128 (L) 08/19/2021 1600   K 3.8 10/23/2021 0308   CL 100 10/23/2021 0308   CO2 23 10/23/2021 0308   GLUCOSE 128 (H) 10/23/2021 0308   BUN 17 10/23/2021 0308   BUN 20 08/19/2021 1600   CREATININE 2.57 (H) 10/23/2021 0308    CALCIUM 8.0 (L) 10/23/2021 0308   PROT 6.8 10/22/2021 1331   PROT 8.2 08/19/2021 1600   ALBUMIN 1.9 (L) 10/22/2021 1331   ALBUMIN 3.0 (L) 08/19/2021 1600   AST 15 10/22/2021 1331   ALT 11 10/22/2021 1331   ALKPHOS 59 10/22/2021 1331   BILITOT 0.4 10/22/2021 1331   BILITOT 0.4 08/19/2021 1600   GFRNONAA 23 (L) 10/23/2021 0308   GFRAA 49 (L) 01/11/2015 2208   Lipase     Component Value Date/Time   LIPASE 37 11/30/2020 1107       Studies/Results: CT Abdomen Pelvis Wo Contrast  Result Date: 10/22/2021 CLINICAL DATA:  Blunt trauma.  Tractor ran over pelvis area EXAM: CT ABDOMEN AND PELVIS WITHOUT CONTRAST TECHNIQUE: Multidetector CT imaging of the abdomen and pelvis was performed following the standard protocol without IV contrast. RADIATION DOSE REDUCTION: This exam was performed according to the departmental dose-optimization program which includes automated exposure control, adjustment of the mA and/or kV according to patient size and/or use of iterative reconstruction technique. COMPARISON:  CT 11/30/2020 FINDINGS: Lower chest: Emphysematous lung bases. Mild cardiomegaly. Small pericardial effusion, unchanged. Relative hypoattenuation of the cardiac blood pool indicative of anemia. Hepatobiliary: Sludge and stones again noted within  the gallbladder. Gallbladder is anteriorly displaced by large renal cysts. No focal liver lesion or evidence of hepatic injury. No perihepatic hematoma. Pancreas: Grossly unremarkable. Spleen: Normal in size without focal abnormality. Adrenals/Urinary Tract: No adrenal nodule. Stable appearance of numerous large bilateral renal cysts, largest within the upper pole of the right kidney measuring at least 22 cm. Near-complete replacement of normal renal parenchyma bilaterally. Large staghorn calculi in the left kidney, largest measuring up to 6.4 cm in diameter. No evidence of hydronephrosis. Urinary bladder is incompletely distended. Stomach/Bowel: Small hiatal  hernia. No dilated loops of bowel. Colonic diverticulosis. No evidence of active bowel inflammation, although evaluation is limited in the absence of oral or IV contrast. Vascular/Lymphatic: Aortic atherosclerosis. No enlarged abdominal or pelvic lymph nodes. Reproductive: Prior prostatectomy. Other: Mild presacral edema is unchanged. No intra-abdominal or pelvic hematoma or evidence of hemoperitoneum. No pneumoperitoneum. Small fat containing right inguinal hernia. Musculoskeletal: Bilateral pubic bone fractures including involvement of the left parasymphyseal pubic bone with left inferior pubic ramus and left pubic root fracture. Mildly displaced right inferior pubic ramus fracture and nondisplaced right pubic root fracture. Pubic symphysis intact without diastasis. Bilateral SI joints intact without diastasis. Bilateral hip joints intact. No fracture of the proximal femurs. Lumbar vertebral body heights are maintained. Degenerative grade 1 anterolisthesis of L4 on L5 is unchanged. No appreciable soft tissue hematoma. IMPRESSION: 1. Acute multiple bilateral pubic bone fractures, as described above. 2. No evidence of acute traumatic solid organ injury to the abdomen or pelvis. 3. Stable appearance of numerous large bilateral renal cysts, largest within the upper pole of the right kidney measuring at least 22 cm. Large staghorn calculi in the left kidney, largest measuring up to 6.4 cm in diameter. No evidence of hydronephrosis. 4. Sludge and stones within the gallbladder. 5. Colonic diverticulosis without evidence of active bowel inflammation. 6. Small pericardial effusion, unchanged. Aortic Atherosclerosis (ICD10-I70.0) and Emphysema (ICD10-J43.9). Electronically Signed   By: Davina Poke D.O.   On: 10/22/2021 14:08   DG Pelvis Portable  Result Date: 10/22/2021 CLINICAL DATA:  Run over by a tractor.  Pelvic and abdominal pain. EXAM: PORTABLE PELVIS 1-2 VIEWS COMPARISON:  None Available. FINDINGS: No  displaced fracture or dislocation. Diffuse osteopenia limits evaluation. Multiple surgical clips overlying the pelvic cavity. Hyperdense foreign body projecting over the proximal right femur. IMPRESSION: No definite evidence of acute displaced fracture or dislocation. Diffuse osteopenia limits evaluation. Cross-sectional imaging could be obtained for further evaluation if clinically warranted. Electronically Signed   By: Keane Police D.O.   On: 10/22/2021 13:43   DG Chest Port 1 View  Result Date: 10/22/2021 CLINICAL DATA:  Ran over by a tractor. EXAM: PORTABLE CHEST 1 VIEW COMPARISON:  None Available. FINDINGS: The heart is enlarged. Atherosclerotic calcification of the aortic arch. Lungs are clear without evidence of focal consolidation or pleural effusion. No appreciable pneumothorax. No acute displaced fracture. IMPRESSION: No active disease. Electronically Signed   By: Keane Police D.O.   On: 10/22/2021 13:45   DG Hand Complete Left  Result Date: 10/22/2021 CLINICAL DATA:  Tractor injury to the left hand EXAM: LEFT HAND - COMPLETE 3+ VIEW COMPARISON:  None Available. FINDINGS: Comminuted nondisplaced non articular distal metadiaphysis fracture in the left fifth metacarpal with surrounding soft tissue swelling. No dislocation. No suspicious focal osseous lesions. Moderate osteoarthritis in the interphalangeal joint left thumb. Mild first MCP joint osteoarthritis. Chondrocalcinosis in the triangular fibrocartilage in the left wrist. No radiopaque foreign bodies. IMPRESSION: 1. Comminuted nondisplaced non  articular distal left fifth metacarpal fracture with surrounding soft tissue swelling. 2. 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 Mild-to-moderate polyarticular first ray osteoarthritis in the left hand. Electronically Signed   By: Ilona Sorrel M.D.   On: 10/22/2021 15:10    Anti-infectives: Anti-infectives (From admission, onward)    None        Assessment/Plan Ran  over by tractor Pevlic Fx's - Ortho consulted, WBAT, PT/OT, pain control L 5th MCP fx - ortho consulted, ulnar gutter splint, WBAT through L elbow Hx of hypothyroidism - home meds Hx HLD - home meds   Hx HTN - home meds   Renal cysts with CKD - baseline Cr appears around 2.8, BMP pending this morning Lower Extremity Edema - calves soft/ nontender, some improvement after Lasix IV x1 on 5/17 GERD - protonix   FEN - HH diet, Ensure, add miralax VTE - SCDs, SQH 5/17 ID - None Foley - None   Dispo - Labs pending. Wean to room air. Continue therapies - recommending CIR.   I reviewed therapy notes, last 24 h vitals and pain scores, last 48 h intake and output, last 24 h labs and trends (today's labs pending), and last 24 h imaging results.    LOS: 0 days    Wellington Hampshire, [facility] 10/24/2021, 8:32 AM Please see Amion for pager number during day hours 7:00am-4:30pm

## 2021-10-24 NOTE — Discharge Summary (Signed)
[facility] Discharge Summary   Patient ID: Rick [name] MRN: [id number] DOB/AGE: Feb 01, 1929 88 y.o.  Admit date: 10/22/2021 Discharge date: 11/01/2021   Discharge Diagnosis Ran over by tractor Pevlic Fractures Left 5th MCP fx Hx of hypothyroidism  Hx HLD Hx HTN  Renal cysts with CKD  Lower Extremity Edema GERD  Hyponatremia  Consultants Orthopedics  Imaging: No results found.  Procedures None  Hospital Course:  Rick [name] is a 88yo male with a history of hypertension, hyperlipidemia, GERD, hypothyroidism, renal cysts with CKD and a remote history of prostate cancer status post prostatectomy who presented to the ED after he was working on his tractor and it ran over him.  Patient reports he was finishing up and actually put the tractor in gear and could not get out of the way fast enough.  Notes the wheel ran over his pelvic region.  Reports pain in his pelvis and left hand. No other complaints.  Has not been able to walk since this time. Underwent chest x-ray that was negative. Pelvic x-rays inconclusive.  CT A/P showed acute multiple bilateral pubic bone fractures.  This was done without contrast secondary to patient's underlying kidney function but there did not appear to be any pelvic hematomas. Hgb stable at his baseline of 9.2. Trauma asked to see for admission.  Orthopedics was consulted for his multiple orthopedic injuries. Pelvic fractures treated nonoperatively, WBAT BLE. Left 5th metacarpal fracture was treated nonoperatively and placed in an ulnar gutter splint, WBAT through left elbow.  Patient worked with therapies during this admission who recommended inpatient rehab upon discharge. Unfortunately the patients insurance denied admission to CIR. Therapies then recommened SNF. On 11/01/2021 the patient was felt stable for discharge to SNF.  Patient will follow up as below and knows to call with questions or concerns.    On the day of discharge patient  had an abdominal exam notable for palpable hard stool within the colon in the right hemiabdomen. He was having flatus and BMs. The plan was to increase his miralax to help relieve that stool burden - planned to give 1/2 bottle of miralax today if he were in the hospital. I do not think this is an indication to keep him admitted but his bowel function should be closely monitored at the SNF.    Allergies as of 11/01/2021       Reactions   Lisinopril    cough   Nsaids    Elevated Cr        Medication List     STOP taking these medications    Senexon-S 8.6-50 MG tablet Generic drug: senna-docusate   timolol 0.5 % ophthalmic solution Commonly known as: TIMOPTIC       TAKE these medications    acetaminophen 325 MG tablet Commonly known as: TYLENOL Take 2 tablets (650 mg total) by mouth every 6 (six) hours as needed.   amLODipine 10 MG tablet Commonly known as: NORVASC Take 10 mg by mouth daily.   atorvastatin 10 MG tablet Commonly known as: LIPITOR Take 1 tablet (10 mg total) by mouth at bedtime.   bacitracin ointment Apply topically 2 (two) times daily.   bisacodyl 10 MG suppository Commonly known as: DULCOLAX Place 1 suppository (10 mg total) rectally daily as needed for moderate constipation.   calcium carbonate 500 MG chewable tablet Commonly known as: TUMS - dosed in mg elemental calcium Chew 1 tablet (200 mg of elemental calcium total) by mouth 2 (two) times daily.  docusate sodium 100 MG capsule Commonly known as: COLACE Take 1 capsule (100 mg total) by mouth 2 (two) times daily.   feeding supplement Liqd Take 237 mLs by mouth 2 (two) times daily between meals.   FeroSul 325 (65 FE) MG tablet Generic drug: ferrous sulfate Take 1 tablet (325 mg total) by mouth 2 (two) times daily with a meal. What changed: when to take this   latanoprost 0.005 % ophthalmic solution Commonly known as: XALATAN Place 1 drop into both eyes at bedtime.   levothyroxine  25 MCG tablet Commonly known as: SYNTHROID Take 1 tablet (25 mcg total) by mouth daily. Take on empty stomach early in the morning.   losartan 100 MG tablet Commonly known as: COZAAR Take 100 mg by mouth daily.   omeprazole 20 MG tablet Commonly known as: PRILOSEC OTC Take 1 tablet (20 mg total) by mouth daily.   polyethylene glycol powder 17 GM/SCOOP powder Commonly known as: GLYCOLAX/MIRALAX Take 127.5 g by mouth once for 1 dose.   polyethylene glycol powder 17 GM/SCOOP powder Commonly known as: MiraLax Take 17 g by mouth daily. To correct constipation.  Adjust dose over 1-2 months.  Goal = ~1 bowel movement / day Start taking on: Nov 02, 2021   sodium bicarbonate 650 MG tablet Take 2 tablets (1,300 mg total) by mouth 2 (two) times daily. What changed: additional instructions   sodium chloride 1 g tablet Take 2 tablets (2 g total) by mouth 3 (three) times daily with meals.          Contact information for follow-up providers     Altamese Ericson, MD. Call.   Specialty: Orthopedic Surgery Why: follow up regarding pelvic and hand fractures Contact information: [place] [address] [phone]         Tonia Ghent, MD. Call.   Specialty: Family Medicine Why: call for post-hospitalization follow up appointment Contact information: [place] [address] [phone]              Contact information for after-discharge care     Destination     [facility] Preferred SNF .   Service: Skilled Nursing Contact information: [address] [place] [phone]                      Signed: Obie Dredge, [facility] 11/01/2021, 11:16 AM Please see Amion for pager number during day hours 7:00am-4:30pm

## 2021-10-24 NOTE — Progress Notes (Signed)
Mobility Specialist Progress Note:   10/24/21 1403  Mobility  Activity Transferred from chair to bed  Level of Assistance Moderate assist, patient does 50-74%  Assistive Device  (HHA)  Distance Ambulated (ft) 2 ft  Activity Response Tolerated well  $Mobility charge 1 Mobility   Pt received in chair needing to go back to bed. No complaints of pain. ModA to stand and step to bed. Left in bed with call bell in reach and all needs met.   [facility] Public librarian Phone [phone]

## 2021-10-25 DIAGNOSIS — N189 Chronic kidney disease, unspecified: Secondary | ICD-10-CM | POA: Diagnosis present

## 2021-10-25 DIAGNOSIS — R41841 Cognitive communication deficit: Secondary | ICD-10-CM | POA: Diagnosis not present

## 2021-10-25 DIAGNOSIS — Z823 Family history of stroke: Secondary | ICD-10-CM | POA: Diagnosis not present

## 2021-10-25 DIAGNOSIS — Z800 Family history of malignant neoplasm of digestive organs: Secondary | ICD-10-CM | POA: Diagnosis not present

## 2021-10-25 DIAGNOSIS — M6281 Muscle weakness (generalized): Secondary | ICD-10-CM | POA: Diagnosis not present

## 2021-10-25 DIAGNOSIS — R2689 Other abnormalities of gait and mobility: Secondary | ICD-10-CM | POA: Diagnosis not present

## 2021-10-25 DIAGNOSIS — S32502A Unspecified fracture of left pubis, initial encounter for closed fracture: Secondary | ICD-10-CM | POA: Diagnosis present

## 2021-10-25 DIAGNOSIS — E039 Hypothyroidism, unspecified: Secondary | ICD-10-CM | POA: Diagnosis present

## 2021-10-25 DIAGNOSIS — S32501A Unspecified fracture of right pubis, initial encounter for closed fracture: Secondary | ICD-10-CM | POA: Diagnosis present

## 2021-10-25 DIAGNOSIS — Z7401 Bed confinement status: Secondary | ICD-10-CM | POA: Diagnosis not present

## 2021-10-25 DIAGNOSIS — M6259 Muscle wasting and atrophy, not elsewhere classified, multiple sites: Secondary | ICD-10-CM | POA: Diagnosis not present

## 2021-10-25 DIAGNOSIS — N281 Cyst of kidney, acquired: Secondary | ICD-10-CM | POA: Diagnosis present

## 2021-10-25 DIAGNOSIS — I129 Hypertensive chronic kidney disease with stage 1 through stage 4 chronic kidney disease, or unspecified chronic kidney disease: Secondary | ICD-10-CM | POA: Diagnosis present

## 2021-10-25 DIAGNOSIS — R262 Difficulty in walking, not elsewhere classified: Secondary | ICD-10-CM | POA: Diagnosis not present

## 2021-10-25 DIAGNOSIS — Z801 Family history of malignant neoplasm of trachea, bronchus and lung: Secondary | ICD-10-CM | POA: Diagnosis not present

## 2021-10-25 DIAGNOSIS — S3282XS Multiple fractures of pelvis without disruption of pelvic ring, sequela: Secondary | ICD-10-CM | POA: Diagnosis not present

## 2021-10-25 DIAGNOSIS — Z888 Allergy status to other drugs, medicaments and biological substances status: Secondary | ICD-10-CM | POA: Diagnosis not present

## 2021-10-25 DIAGNOSIS — K219 Gastro-esophageal reflux disease without esophagitis: Secondary | ICD-10-CM | POA: Diagnosis present

## 2021-10-25 DIAGNOSIS — S61412A Laceration without foreign body of left hand, initial encounter: Secondary | ICD-10-CM | POA: Diagnosis present

## 2021-10-25 DIAGNOSIS — Y929 Unspecified place or not applicable: Secondary | ICD-10-CM | POA: Diagnosis not present

## 2021-10-25 DIAGNOSIS — S32591S Other specified fracture of right pubis, sequela: Secondary | ICD-10-CM | POA: Diagnosis not present

## 2021-10-25 DIAGNOSIS — T07XXXA Unspecified multiple injuries, initial encounter: Secondary | ICD-10-CM | POA: Diagnosis not present

## 2021-10-25 DIAGNOSIS — E871 Hypo-osmolality and hyponatremia: Secondary | ICD-10-CM | POA: Diagnosis present

## 2021-10-25 DIAGNOSIS — S62307A Unspecified fracture of fifth metacarpal bone, left hand, initial encounter for closed fracture: Secondary | ICD-10-CM | POA: Diagnosis present

## 2021-10-25 DIAGNOSIS — S3282XA Multiple fractures of pelvis without disruption of pelvic ring, initial encounter for closed fracture: Secondary | ICD-10-CM | POA: Diagnosis present

## 2021-10-25 DIAGNOSIS — S3289XA Fracture of other parts of pelvis, initial encounter for closed fracture: Secondary | ICD-10-CM | POA: Diagnosis not present

## 2021-10-25 DIAGNOSIS — Z79890 Hormone replacement therapy (postmenopausal): Secondary | ICD-10-CM | POA: Diagnosis not present

## 2021-10-25 DIAGNOSIS — Z8546 Personal history of malignant neoplasm of prostate: Secondary | ICD-10-CM | POA: Diagnosis not present

## 2021-10-25 DIAGNOSIS — Z886 Allergy status to analgesic agent status: Secondary | ICD-10-CM | POA: Diagnosis not present

## 2021-10-25 DIAGNOSIS — Z741 Need for assistance with personal care: Secondary | ICD-10-CM | POA: Diagnosis not present

## 2021-10-25 DIAGNOSIS — D649 Anemia, unspecified: Secondary | ICD-10-CM | POA: Diagnosis present

## 2021-10-25 DIAGNOSIS — S32591A Other specified fracture of right pubis, initial encounter for closed fracture: Secondary | ICD-10-CM | POA: Diagnosis present

## 2021-10-25 DIAGNOSIS — E785 Hyperlipidemia, unspecified: Secondary | ICD-10-CM | POA: Diagnosis present

## 2021-10-25 DIAGNOSIS — S299XXS Unspecified injury of thorax, sequela: Secondary | ICD-10-CM | POA: Diagnosis not present

## 2021-10-25 DIAGNOSIS — R531 Weakness: Secondary | ICD-10-CM | POA: Diagnosis not present

## 2021-10-25 DIAGNOSIS — S32592A Other specified fracture of left pubis, initial encounter for closed fracture: Secondary | ICD-10-CM | POA: Diagnosis present

## 2021-10-25 DIAGNOSIS — X58XXXA Exposure to other specified factors, initial encounter: Secondary | ICD-10-CM | POA: Diagnosis present

## 2021-10-25 DIAGNOSIS — Z720 Tobacco use: Secondary | ICD-10-CM | POA: Diagnosis not present

## 2021-10-25 DIAGNOSIS — I10 Essential (primary) hypertension: Secondary | ICD-10-CM | POA: Diagnosis not present

## 2021-10-25 LAB — BASIC METABOLIC PANEL
Anion gap: 7 (ref 5–15)
BUN: 20 mg/dL (ref 8–23)
CO2: 23 mmol/L (ref 22–32)
Calcium: 7.8 mg/dL — ABNORMAL LOW (ref 8.9–10.3)
Chloride: 98 mmol/L (ref 98–111)
Creatinine, Ser: 2.4 mg/dL — ABNORMAL HIGH (ref 0.61–1.24)
GFR, Estimated: 25 mL/min — ABNORMAL LOW (ref >60)
Glucose, Bld: 97 mg/dL (ref 70–99)
Potassium: 3.7 mmol/L (ref 3.5–5.1)
Sodium: 128 mmol/L — ABNORMAL LOW (ref 135–145)

## 2021-10-25 MED ORDER — BISACODYL 10 MG RE SUPP: 10.0000 mg | Freq: Every day | RECTAL | Status: DC | PRN

## 2021-10-25 MED ORDER — CALCIUM CARBONATE ANTACID 500 MG PO CHEW
1.0000 | CHEWABLE_TABLET | Freq: Two times a day (BID) | ORAL | Status: DC
  Administered 2021-10-25 – 2021-11-01 (×15): 200 mg via ORAL
  Filled 2021-10-25 (×15): qty 1

## 2021-10-25 MED ORDER — SODIUM CHLORIDE 1 G PO TABS
2.0000 g | ORAL_TABLET | Freq: Three times a day (TID) | ORAL | Status: DC
  Administered 2021-10-25 – 2021-11-01 (×22): 2 g via ORAL
  Filled 2021-10-25 (×23): qty 2

## 2021-10-25 MED ORDER — POLYETHYLENE GLYCOL 3350 17 G PO PACK
17.0000 g | PACK | Freq: Two times a day (BID) | ORAL | Status: DC
  Administered 2021-10-25 – 2021-10-31 (×7): 17 g via ORAL
  Filled 2021-10-25 (×13): qty 1

## 2021-10-25 NOTE — Plan of Care (Signed)
  Problem: Nutrition: Goal: Adequate nutrition will be maintained Outcome: Progressing   Problem: Pain Managment: Goal: General experience of comfort will improve Outcome: Progressing   Problem: Safety: Goal: Ability to remain free from injury will improve Outcome: Progressing   

## 2021-10-25 NOTE — Progress Notes (Signed)
Patient ID: Rick [name], male   DOB: [birth year], 88 y.o.   MRN: [id number] [facility] Surgery Progress Note     Subjective: CC-  Has some pelvic pain with mobilization. No pain at rest. Tolerating diet. Passing flatus, no BM. Denies n/v.  Objective: Vital signs in last 24 hours: Temp:  [97.4 F (36.3 C)-98.3 F (36.8 C)] 98.2 F (36.8 C) (05/19 0812) Pulse Rate:  [79-88] 79 (05/19 0812) Resp:  [18-20] 20 (05/19 0812) BP: (134-143)/(80-86) 138/80 (05/19 0812) SpO2:  [91 %-95 %] 95 % (05/19 0812) Last BM Date : 10/21/21 (per patient and wife)  Intake/Output from previous day: 05/18 0701 - 05/19 0700 In: 600 [P.O.:600] Out: 1100 [Urine:1100] Intake/Output this shift: Total I/O In: 240 [P.O.:240] Out: 300 [Urine:300]  PE: Gen:  Alert, NAD, pleasant Card:  RRR Pulm:  CTAB, no W/R/R, rate and effort normal on room air Abd: Soft, NT/ND Ext:  ulnar gutter splint to LUE, fingers WWP with trace edema. calves soft and nontender, trace BLE edmma  Lab Results:  Recent Labs    10/23/21 0308 10/24/21 0947  WBC 10.3 12.1*  HGB 8.8* 10.2*  HCT 27.6* 32.1*  PLT 236 244   BMET Recent Labs    10/24/21 0947 10/25/21 0251  NA 128* 128*  K 3.8 3.7  CL 96* 98  CO2 25 23  GLUCOSE 133* 97  BUN 18 20  CREATININE 2.54* 2.40*  CALCIUM 8.1* 7.8*   PT/INR No results for input(s): LABPROT, INR in the last 72 hours. CMP     Component Value Date/Time   NA 128 (L) 10/25/2021 0251   NA 128 (L) 08/19/2021 1600   K 3.7 10/25/2021 0251   CL 98 10/25/2021 0251   CO2 23 10/25/2021 0251   GLUCOSE 97 10/25/2021 0251   BUN 20 10/25/2021 0251   BUN 20 08/19/2021 1600   CREATININE 2.40 (H) 10/25/2021 0251   CALCIUM 7.8 (L) 10/25/2021 0251   PROT 6.8 10/22/2021 1331   PROT 8.2 08/19/2021 1600   ALBUMIN 1.9 (L) 10/22/2021 1331   ALBUMIN 3.0 (L) 08/19/2021 1600   AST 15 10/22/2021 1331   ALT 11 10/22/2021 1331   ALKPHOS 59 10/22/2021 1331   BILITOT 0.4 10/22/2021 1331    BILITOT 0.4 08/19/2021 1600   GFRNONAA 25 (L) 10/25/2021 0251   GFRAA 49 (L) 01/11/2015 2208   Lipase     Component Value Date/Time   LIPASE 37 11/30/2020 1107       Studies/Results: No results found.  Anti-infectives: Anti-infectives (From admission, onward)    None        Assessment/Plan Ran over by tractor Pevlic Fx's - Ortho consulted, WBAT, PT/OT, pain control L 5th MCP fx - ortho consulted, ulnar gutter splint, WBAT through L elbow Hx of hypothyroidism - home meds Hx HLD - home meds   Hx HTN - home meds   Renal cysts with CKD - Cr 2.4 stable, baseline Cr appears around 2.5-2.8 Lower Extremity Edema - calves soft/ nontender, improvement after Lasix IV x1 on 5/17 GERD - protonix Hypocalcemia - Ca 7.8, add tums Hyponatremia - Nas stable at 128. Increase salt tabs 2g TID and repeat BMP in AM   FEN - HH diet, Ensure, increase miralax BID VTE - SCDs, SQH 5/17 ID - None Foley - None    Dispo - Continue therapies. Medically stable for CIR when insurance auth complete and bed available.    I reviewed therapy notes, last 24 h vitals and  pain scores, last 48 h intake and output, last 24 h labs and trends (BMP).    LOS: 0 days    Wellington Hampshire, [facility] 10/25/2021, 9:10 AM Please see Amion for pager number during day hours 7:00am-4:30pm

## 2021-10-25 NOTE — Progress Notes (Signed)
Inpatient Rehabilitation Admissions Coordinator   I met at bedside with patient , wife and son. We discussed estimate of cost of care if [employer] approves a Cir admit. I await Human determination for possible admit. I explained that other rehab venue options may need to be pursued if not approved or bed unavailable.  Danne Baxter, RN, MSN Rehab Admissions Coordinator [phone] 10/25/2021 1:38 PM

## 2021-10-25 NOTE — TOC Initial Note (Signed)
Transition of Care [facility]) - Initial/Assessment Note    Patient Details  Name: Rick [name] MRN: [id number] Date of Birth: 02-Apr-1929  [facility] CM/SW Contact:    Ella Bodo, RN Phone Number: 10/25/2021, 1:58 PM  Clinical Narrative:                 Pt is a 88 y.o. M who presents 10/22/2021 after being run over by tractor with pelvic fractures, L 5th MCP fx. PTA, pt independent and living at home with spouse; family able to provide 24-hour assistance at discharge.  Patient ambulates with a single-point cane.  PT/OT recommending inpatient rehab, and insurance authorization pending for CIR admit.  TOC will continue to follow as patient progresses.  Expected Discharge Plan: IP Rehab Facility Barriers to Discharge: Continued Medical Work up   Patient Goals and CMS Choice   CMS Medicare.gov Compare Post Acute Care list provided to:: Patient Choice offered to / list presented to : Patient, Adult Children  Expected Discharge Plan and Services Expected Discharge Plan: [place]   Discharge Planning Services: CM Consult Post Acute Care Choice: IP Rehab Living arrangements for the past 2 months: Single Family Home                                      Prior Living Arrangements/Services Living arrangements for the past 2 months: Single Family Home Lives with:: Spouse Patient language and need for interpreter reviewed:: Yes Do you feel safe going back to the place where you live?: Yes      Need for Family Participation in Patient Care: Yes (Comment) Care giver support system in place?: Yes (comment)   Criminal Activity/Legal Involvement Pertinent to Current Situation/Hospitalization: No - Comment as needed  Activities of Daily Living Home Assistive Devices/Equipment: Cane (specify quad or straight) ADL Screening (condition at time of admission) Patient's cognitive ability adequate to safely complete daily activities?: Yes Is the patient deaf or  have difficulty hearing?: No Does the patient have difficulty seeing, even when wearing glasses/contacts?: No Does the patient have difficulty concentrating, remembering, or making decisions?: No Patient able to express need for assistance with ADLs?: Yes Does the patient have difficulty dressing or bathing?: No Independently performs ADLs?: Yes (appropriate for developmental age) Does the patient have difficulty walking or climbing stairs?: No Weakness of Legs: None Weakness of Arms/Hands: None                 Emotional Assessment   Attitude/Demeanor/Rapport: Engaged Affect (typically observed): Accepting Orientation: : Oriented to Self, Oriented to Place, Oriented to  Time, Oriented to Situation      Admission diagnosis:  Chronic anemia [D64.9] Multiple closed pelvic fractures without disruption of pelvic ring (HCC) [S32.82XA] Accidental fall, initial encounter [G81.XXXA] Bilateral pubic rami fractures, closed, initial encounter ([place]) [S32.591A, S32.592A] Stage 4 chronic kidney disease ([place]) [N18.4] Blunt traumatic injury of thoraco-abdomino-pelvic region [S29.9XXA, S39.91XA, S39.93XA] Multiple pelvic fractures ([place]) [S32.82XA] Patient Active Problem List   Diagnosis Date Noted   Multiple pelvic fractures ([place]) 10/25/2021   Multiple closed pelvic fractures without disruption of pelvic ring ([place]) 10/22/2021   Gastritis and gastroduodenitis    Gastric polyp    GI bleed 05/02/2021   Anemia due to stage 4 chronic kidney disease ([place]) 05/02/2021   Hyponatremia 05/02/2021   Acute on chronic heart failure with preserved ejection fraction (HFpEF) ([place]) 05/02/2021  Cystic lesion of abdominal viscera 12/11/2020   Mitral regurgitation 12/02/2020   Demand ischemia ([place]) 12/02/2020   Aortic atherosclerosis ([place]) 12/01/2020   Hypertensive urgency 12/01/2020   Polycystic kidney disease 12/01/2020   AKI (acute kidney injury) ([place]) superimposed on CKD stage IV 12/01/2020   Glaucoma  12/01/2020   Diarrhea 12/01/2020   Acute hypoxemic respiratory failure ([place]) 12/01/2020   CKD (chronic kidney disease), stage IV ([place]) 12/01/2020   Acute on chronic diastolic CHF (congestive heart failure) ([place]) 11/30/2020   Shoulder pain 05/30/2020   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   Retinal hemorrhage, right eye 05/29/2020   Hypertensive retinopathy, right eye 05/29/2020   Creatinine elevation 11/17/2018   Cough 10/25/2017   Health care maintenance 10/22/2016   Anemia, iron deficiency 04/11/2015   Advance care planning 01/16/2015   Benign paroxysmal positional vertigo 01/16/2015   History of prostate cancer 02/02/2014   Bilateral renal cysts 01/31/2014   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   Family history of malignant neoplasm of gastrointestinal tract 03/04/2012   Medicare annual wellness visit, subsequent 01/23/2012   VENTRAL HERNIA, ASYMPTOMATIC 03/09/2008   TOBACCO ABUSE, HX OF 99/35/7017   HELICOBACTER PYLORI INFECTION 11/25/2006   HYPERCHOLESTEROLEMIA 11/25/2006   Essential hypertension 11/25/2006   GERD 11/25/2006   Hyperglycemia 11/25/2006   PCP:  Tonia Ghent, MD Pharmacy:   [id number] [place] [place] [address]Phone: [phone]: [phone] [facility] [address]Phone: [phone]: [phone]    Social Determinants of Health (SDOH) Interventions    Readmission Risk Interventions     View : No data to display.         JReinaldo Raddle RN, BSN  Trauma/Neuro ICU Case Manager [phone]

## 2021-10-25 NOTE — Progress Notes (Signed)
Physical Therapy Treatment Patient Details Name: Rick [name] MRN: [id number] DOB: 1929/01/04 Today's Date: 10/25/2021   History of Present Illness Pt is a 88 y.o. M who presents 10/22/2021 after being run over by tractor with pelvic fractures, L 5th MCP fx. Significant PMH: prostate CA, HLD.    PT Comments    Pt received supine and agreeable to session with continued progress towards goals. Pt able to come to sitting EOB with mod assist for LE management and to elevate trunk secondary to WB status and pain. Pt requiring mod assist to come to standing with platform walker with poor carryover from previous session for hand placement with heavy verbal and tactile cues needed. Pt with increased tolerance for ambulation this session, however requiring cues throughout for sequencing and assist to manage and advance RW, and good tolerance for LE therex for increased ROM and strength. Pt continues to benefit from skilled PT services to progress toward functional mobility goals.     Recommendations for follow up therapy are one component of a multi-disciplinary discharge planning process, led by the attending physician.  Recommendations may be updated based on patient status, additional functional criteria and insurance authorization.  Follow Up Recommendations  Acute inpatient rehab (3hours/day)     Assistance Recommended at Discharge Frequent or constant Supervision/Assistance  Patient can return home with the following A lot of help with walking and/or transfers;A lot of help with bathing/dressing/bathroom;Assistance with cooking/housework;Assist for transportation;Help with stairs or ramp for entrance   Equipment Recommendations  Other (comment);BSC/3in1;Wheelchair (measurements PT);Wheelchair cushion (measurements PT) (L platform RW)    Recommendations for Other Services Rehab consult     Precautions / Restrictions Precautions Precautions: Fall Required Braces or Orthoses:  Splint/Cast Restrictions Weight Bearing Restrictions: Yes LUE Weight Bearing: Weight bearing as tolerated RLE Weight Bearing: Weight bearing as tolerated LLE Weight Bearing: Weight bearing as tolerated Other Position/Activity Restrictions: no weightbearing on L hand. PT has issued platform walker.     Mobility  Bed Mobility Overal bed mobility: Needs Assistance Bed Mobility: Supine to Sit     Supine to sit: Mod assist     General bed mobility comments: Cues for initiating BLE movement to edge of bed, use of bed pad to assist hips to edge of bed, assist at trunk    Transfers Overall transfer level: Needs assistance Equipment used: Left platform walker Transfers: Sit to/from Stand, Bed to chair/wheelchair/BSC Sit to Stand: Mod assist           General transfer comment: Pt standing from edge of bed with mod aheavy cues for hand placement    Ambulation/Gait Ambulation/Gait assistance: Mod assist Gait Distance (Feet): 20 Feet Assistive device: Left platform walker Gait Pattern/deviations: Step-to pattern, Knee flexed in stance - right, Knee flexed in stance - left, Antalgic Gait velocity: decr     General Gait Details: mod assist for cues and +and safety to steady as pt with noted knee instability, knees buckling during stance but pt able to correct   Stairs             Wheelchair Mobility    Modified Rankin (Stroke Patients Only)       Balance Overall balance assessment: Needs assistance Sitting-balance support: Feet supported Sitting balance-Leahy Scale: Good     Standing balance support: Bilateral upper extremity supported Standing balance-Leahy Scale: Poor Standing balance comment: reliant on external support by of platform walker, pt able to tolerate extended time standing for platform walker adjustment  Cognition Arousal/Alertness: Awake/alert Behavior During Therapy: WFL for tasks  assessed/performed Overall Cognitive Status: Within Functional Limits for tasks assessed                                          Exercises General Exercises - Lower Extremity Ankle Circles/Pumps: Both, 20 reps Long Arc Quad: Both, 10 reps Hip ABduction/ADduction: Both, 10 reps Hip Flexion/Marching: Both, 10 reps Heel Raises: Both, 10 reps    General Comments        Pertinent Vitals/Pain Pain Assessment Pain Assessment: Faces Faces Pain Scale: Hurts even more Pain Location: pelvis with movement (particularly on L ) Pain Descriptors / Indicators: Aching Pain Intervention(s): Limited activity within patient's tolerance, Monitored during session, Repositioned    Home Living                          Prior Function            PT Goals (current goals can now be found in the care plan section) Acute Rehab PT Goals Patient Stated Goal: to get out of bed PT Goal Formulation: With patient/family Time For Goal Achievement: 11/06/21    Frequency    Min 5X/week      PT Plan      Co-evaluation              AM-PAC PT "6 Clicks" Mobility   Outcome Measure  Help needed turning from your back to your side while in a flat bed without using bedrails?: A Lot Help needed moving from lying on your back to sitting on the side of a flat bed without using bedrails?: A Lot Help needed moving to and from a bed to a chair (including a wheelchair)?: A Lot Help needed standing up from a chair using your arms (e.g., wheelchair or bedside chair)?: A Lot Help needed to walk in hospital room?: A Lot Help needed climbing 3-5 steps with a railing? : Total 6 Click Score: 11    End of Session Equipment Utilized During Treatment: Gait belt Activity Tolerance: Patient tolerated treatment well Patient left: in chair;with call bell/phone within reach;with family/visitor present Nurse Communication: Mobility status PT Visit Diagnosis: Pain;Difficulty in walking,  not elsewhere classified (R26.2) Pain - part of body:  (pelvis)     Time: 3235-5732 PT Time Calculation (min) (ACUTE ONLY): 28 min  Charges:  $Gait Training: 8-22 mins $Therapeutic Exercise: 8-22 mins                     Pecolia Marando R. PTA Acute Rehabilitation Services Office: [place] 10/25/2021, 2:21 PM

## 2021-10-26 LAB — BASIC METABOLIC PANEL
Anion gap: 8 (ref 5–15)
BUN: 20 mg/dL (ref 8–23)
CO2: 23 mmol/L (ref 22–32)
Calcium: 8.1 mg/dL — ABNORMAL LOW (ref 8.9–10.3)
Chloride: 101 mmol/L (ref 98–111)
Creatinine, Ser: 2.26 mg/dL — ABNORMAL HIGH (ref 0.61–1.24)
GFR, Estimated: 27 mL/min — ABNORMAL LOW (ref >60)
Glucose, Bld: 82 mg/dL (ref 70–99)
Potassium: 4 mmol/L (ref 3.5–5.1)
Sodium: 132 mmol/L — ABNORMAL LOW (ref 135–145)

## 2021-10-26 NOTE — Progress Notes (Signed)
Patient ID: Rick [name], male   DOB: 11-Sep-1928, 88 y.o.   MRN: [id number] [facility] Surgery Progress Note     Subjective: CC-  No complaints this morning.  Objective: Vital signs in last 24 hours: Temp:  [87.5 F (30.8 C)-98.5 F (36.9 C)] 87.5 F (30.8 C) (05/20 0812) Pulse Rate:  [82-85] 85 (05/20 0812) Resp:  [16-18] 17 (05/20 0812) BP: (127-142)/(78-87) 140/84 (05/20 0812) SpO2:  [94 %-98 %] 97 % (05/20 0812) Last BM Date : 10/22/21  Intake/Output from previous day: 05/19 0701 - 05/20 0700 In: 493 [P.O.:490; I.V.:3] Out: 2300 [Urine:2300] Intake/Output this shift: No intake/output data recorded.  PE: Gen:  Alert, NAD, pleasant Card:  RRR Pulm:  CTAB, no W/R/R, rate and effort normal on room air Abd: Soft, NT/ND Ext:  ulnar gutter splint to LUE, fingers WWP with trace edema. calves soft and nontender, trace BLE edmma  Lab Results:  Recent Labs    10/24/21 0947  WBC 12.1*  HGB 10.2*  HCT 32.1*  PLT 244    BMET Recent Labs    10/25/21 0251 10/26/21 0311  NA 128* 132*  K 3.7 4.0  CL 98 101  CO2 23 23  GLUCOSE 97 82  BUN 20 20  CREATININE 2.40* 2.26*  CALCIUM 7.8* 8.1*    PT/INR No results for input(s): LABPROT, INR in the last 72 hours. CMP     Component Value Date/Time   NA 132 (L) 10/26/2021 0311   NA 128 (L) 08/19/2021 1600   K 4.0 10/26/2021 0311   CL 101 10/26/2021 0311   CO2 23 10/26/2021 0311   GLUCOSE 82 10/26/2021 0311   BUN 20 10/26/2021 0311   BUN 20 08/19/2021 1600   CREATININE 2.26 (H) 10/26/2021 0311   CALCIUM 8.1 (L) 10/26/2021 0311   PROT 6.8 10/22/2021 1331   PROT 8.2 08/19/2021 1600   ALBUMIN 1.9 (L) 10/22/2021 1331   ALBUMIN 3.0 (L) 08/19/2021 1600   AST 15 10/22/2021 1331   ALT 11 10/22/2021 1331   ALKPHOS 59 10/22/2021 1331   BILITOT 0.4 10/22/2021 1331   BILITOT 0.4 08/19/2021 1600   GFRNONAA 27 (L) 10/26/2021 0311   GFRAA 49 (L) 01/11/2015 2208   Lipase     Component Value Date/Time   LIPASE 37  11/30/2020 1107       Studies/Results: No results found.  Anti-infectives: Anti-infectives (From admission, onward)    None        Assessment/Plan Ran over by tractor Pevlic Fx's - Ortho consulted, WBAT, PT/OT, pain control L 5th MCP fx - ortho consulted, ulnar gutter splint, WBAT through L elbow Hx of hypothyroidism - home meds Hx HLD - home meds   Hx HTN - home meds   Renal cysts with CKD - Cr 2.4 stable, baseline Cr appears around 2.5-2.8 Lower Extremity Edema - calves soft/ nontender, improvement after Lasix IV x1 on 5/17 GERD - protonix Hypocalcemia - Ca 7.8, add tums Hyponatremia - Nas stable at 128. Increase salt tabs 2g TID and repeat BMP in AM   FEN - HH diet, Ensure, increase miralax BID VTE - SCDs, SQH 5/17 ID - None Foley - None    Dispo - Continue therapies. Medically stable for CIR when insurance auth complete and bed available.   No changes today   I reviewed therapy notes, last 24 h vitals and pain scores, last 48 h intake and output, last 24 h labs and trends (BMP).    LOS: 1 day  Felicie Morn, [facility] 10/26/2021, 8:31 AM Please see Amion for pager number during day hours 7:00am-4:30pm

## 2021-10-27 NOTE — Progress Notes (Signed)
Mobility Specialist Progress Note   10/27/21 1643  Mobility  Activity Ambulated with assistance in room;Transferred from bed to chair  Level of Assistance Minimal assist, patient does 75% or more  Assistive Device Front wheel walker (Platform Walker*)  LUE Weight Bearing Weight bear through elbow only  LLE Weight Bearing WBAT  Distance Ambulated (ft) 12 ft  Activity Response Tolerated well  $Mobility charge 1 Mobility   Received in bed having no pain but c/o stiffness throughout body, pt sill agreeable. Requiring minA to get EOB d/t trunk weakness. MaxA for verbal cues to remind pt of WB precautions, hand position and RW mechanics.NO faults throughout ambulation. Returned to chair w/ call bell in reach and family present   [name] Falling Mobility Specialist Phone Number [phone]

## 2021-10-27 NOTE — Progress Notes (Signed)
Patient ID: Rick [name], male   DOB: 12-22-1928, 88 y.o.   MRN: [id number] [facility] Surgery Progress Note     Subjective: CC-  No complaints this morning.  Objective: Vital signs in last 24 hours: Temp:  [97.5 F (36.4 C)-98.2 F (36.8 C)] 98.1 F (36.7 C) (05/21 0729) Pulse Rate:  [63-84] 83 (05/21 0729) Resp:  [16-18] 17 (05/21 0729) BP: (126-138)/(85-89) 138/89 (05/21 0729) SpO2:  [94 %-97 %] 97 % (05/21 0729) Last BM Date : 10/26/21  Intake/Output from previous day: 05/20 0701 - 05/21 0700 In: 240 [P.O.:240] Out: 1800 [Urine:1800] Intake/Output this shift: No intake/output data recorded.  PE: Gen:  Alert, NAD, pleasant Card:  RRR Pulm:  CTAB, no W/R/R, rate and effort normal on room air Abd: Soft, NT/ND Ext:  ulnar gutter splint to LUE, fingers WWP with trace edema. calves soft and nontender, trace BLE edmma  Lab Results:  Recent Labs    10/24/21 0947  WBC 12.1*  HGB 10.2*  HCT 32.1*  PLT 244    BMET Recent Labs    10/25/21 0251 10/26/21 0311  NA 128* 132*  K 3.7 4.0  CL 98 101  CO2 23 23  GLUCOSE 97 82  BUN 20 20  CREATININE 2.40* 2.26*  CALCIUM 7.8* 8.1*    PT/INR No results for input(s): LABPROT, INR in the last 72 hours. CMP     Component Value Date/Time   NA 132 (L) 10/26/2021 0311   NA 128 (L) 08/19/2021 1600   K 4.0 10/26/2021 0311   CL 101 10/26/2021 0311   CO2 23 10/26/2021 0311   GLUCOSE 82 10/26/2021 0311   BUN 20 10/26/2021 0311   BUN 20 08/19/2021 1600   CREATININE 2.26 (H) 10/26/2021 0311   CALCIUM 8.1 (L) 10/26/2021 0311   PROT 6.8 10/22/2021 1331   PROT 8.2 08/19/2021 1600   ALBUMIN 1.9 (L) 10/22/2021 1331   ALBUMIN 3.0 (L) 08/19/2021 1600   AST 15 10/22/2021 1331   ALT 11 10/22/2021 1331   ALKPHOS 59 10/22/2021 1331   BILITOT 0.4 10/22/2021 1331   BILITOT 0.4 08/19/2021 1600   GFRNONAA 27 (L) 10/26/2021 0311   GFRAA 49 (L) 01/11/2015 2208   Lipase     Component Value Date/Time   LIPASE 37  11/30/2020 1107       Studies/Results: No results found.  Anti-infectives: Anti-infectives (From admission, onward)    None        Assessment/Plan Ran over by tractor Pevlic Fx's - Ortho consulted, WBAT, PT/OT, pain control L 5th MCP fx - ortho consulted, ulnar gutter splint, WBAT through L elbow Hx of hypothyroidism - home meds Hx HLD - home meds   Hx HTN - home meds   Renal cysts with CKD - Cr 2.4 stable, baseline Cr appears around 2.5-2.8 Lower Extremity Edema - calves soft/ nontender, improvement after Lasix IV x1 on 5/17 GERD - protonix Hypocalcemia - Ca 7.8, add tums Hyponatremia - Nas stable at 128. Increase salt tabs 2g TID and repeat BMP in AM   FEN - HH diet, Ensure, increase miralax BID VTE - SCDs, SQH 5/17 ID - None Foley - None    Dispo - Continue therapies. Medically stable for CIR when insurance auth complete and bed available.   No changes today - Awaiting CIR   I reviewed therapy notes, last 24 h vitals and pain scores, last 48 h intake and output, last 24 h labs and trends (BMP).    LOS:  2 days    Felicie Morn, [facility] 10/27/2021, 8:44 AM Please see Amion for pager number during day hours 7:00am-4:30pm

## 2021-10-27 NOTE — Progress Notes (Signed)
Mobility Specialist Progress Note   10/27/21 1810  Mobility  Activity Ambulated with assistance in hallway  Level of Assistance Minimal assist, patient does 75% or more  Assistive Device Front wheel walker (Platform)  LUE Weight Bearing Weight bear through elbow only  LLE Weight Bearing WBAT  Distance Ambulated (ft) 16 ft  Activity Response Tolerated well  $Mobility charge 1 Mobility   Received in chair having no pain and ready to get back to bed. Pt agreeable to ambulating a little before getting back in. Requiring minA to stand + modA for verbal cues on WB precautions, hand position and RW mechanics. No faults throughout ambulation. Returned BTB w/ call bell in reach and family present.  [name] Falling Mobility Specialist Phone Number [phone]

## 2021-10-27 NOTE — Plan of Care (Signed)
  Problem: Nutrition: Goal: Adequate nutrition will be maintained Outcome: Progressing   Problem: Pain Managment: Goal: General experience of comfort will improve Outcome: Progressing   Problem: Safety: Goal: Ability to remain free from injury will improve Outcome: Progressing   

## 2021-10-28 NOTE — Progress Notes (Signed)
Patient ID: Rick [name], male   DOB: Mar 03, 1929, 88 y.o.   MRN: [id number] [facility] Surgery Progress Note     Subjective: CC-  No complaints. Pain well controlled. Tolerating diet. BM yesterday.  Objective: Vital signs in last 24 hours: Temp:  [97.7 F (36.5 C)-98 F (36.7 C)] 97.9 F (36.6 C) (05/22 0752) Pulse Rate:  [72-89] 72 (05/22 0752) Resp:  [16-18] 18 (05/22 0752) BP: (126-161)/(77-85) 161/81 (05/22 0752) SpO2:  [96 %-98 %] 98 % (05/22 0752) Last BM Date : 10/27/21  Intake/Output from previous day: 05/21 0701 - 05/22 0700 In: 240 [P.O.:240] Out: 1200 [Urine:1200] Intake/Output this shift: No intake/output data recorded.  PE: Gen:  Alert, NAD, pleasant Card:  RRR Pulm:  CTAB, no W/R/R, rate and effort normal on room air Abd: Soft, NT/ND Ext:  ulnar gutter splint to LUE, fingers WWP with trace edema. calves soft and nontender, trace BLE edmma  Lab Results:  No results for input(s): WBC, HGB, HCT, PLT in the last 72 hours. BMET Recent Labs    10/26/21 0311  NA 132*  K 4.0  CL 101  CO2 23  GLUCOSE 82  BUN 20  CREATININE 2.26*  CALCIUM 8.1*   PT/INR No results for input(s): LABPROT, INR in the last 72 hours. CMP     Component Value Date/Time   NA 132 (L) 10/26/2021 0311   NA 128 (L) 08/19/2021 1600   K 4.0 10/26/2021 0311   CL 101 10/26/2021 0311   CO2 23 10/26/2021 0311   GLUCOSE 82 10/26/2021 0311   BUN 20 10/26/2021 0311   BUN 20 08/19/2021 1600   CREATININE 2.26 (H) 10/26/2021 0311   CALCIUM 8.1 (L) 10/26/2021 0311   PROT 6.8 10/22/2021 1331   PROT 8.2 08/19/2021 1600   ALBUMIN 1.9 (L) 10/22/2021 1331   ALBUMIN 3.0 (L) 08/19/2021 1600   AST 15 10/22/2021 1331   ALT 11 10/22/2021 1331   ALKPHOS 59 10/22/2021 1331   BILITOT 0.4 10/22/2021 1331   BILITOT 0.4 08/19/2021 1600   GFRNONAA 27 (L) 10/26/2021 0311   GFRAA 49 (L) 01/11/2015 2208   Lipase     Component Value Date/Time   LIPASE 37 11/30/2020 1107        Studies/Results: No results found.  Anti-infectives: Anti-infectives (From admission, onward)    None        Assessment/Plan Ran over by tractor Pevlic Fx's - Ortho consulted, WBAT, PT/OT, pain control L 5th MCP fx - ortho consulted, ulnar gutter splint, WBAT through L elbow Hx of hypothyroidism - home meds Hx HLD - home meds   Hx HTN - home meds   Renal cysts with CKD - Cr 2.26 stable (5/20), baseline Cr appears around 2.5-2.8 Lower Extremity Edema - calves soft/ nontender, improvement after Lasix IV x1 on 5/17 GERD - protonix Hypocalcemia - Ca 8.1 (5/20), continue tums Hyponatremia - Na 132 (5/20), improving. Continue salt tabs 2g TID   FEN - HH diet, Ensure, miralax BID VTE - SCDs, SQH 5/17 ID - None Foley - None    Dispo - Continue therapies. Medically stable for CIR when insurance auth complete and bed available.    I reviewed therapy notes, last 24 h vitals and pain scores, last 48 h intake and output.    LOS: 3 days    [place] [facility] 10/28/2021, 8:51 AM Please see Amion for pager number during day hours 7:00am-4:30pm

## 2021-10-28 NOTE — Progress Notes (Signed)
Physical Therapy Treatment Patient Details Name: Rick [name] MRN: [id number] DOB: 09-May-1929 Today's Date: 10/28/2021   History of Present Illness Pt is a 88 y.o. M who presents 10/22/2021 after being run over by tractor with pelvic fractures, L 5th MCP fx. Significant PMH: prostate CA, HLD.    PT Comments    Pt received supine and agreeable to session with slow but steady progress towards goals. Pt able to bring LE to and off EOB with min assist this session and required light min assist to elevate trunk. Pt needing mod assist to come to standing to power up and to steady on rise on first trial, throughout session pt requiring down to min assist, with cues for hand placement. Pt demonstrating ability to ambulate with mod assist for RW management and to steady as pt with noted LLE instability. Current plan remains appropriate to address deficits and maximize functional independence and decrease caregiver burden. Pt continues to benefit from skilled PT services to progress toward functional mobility goals.    Recommendations for follow up therapy are one component of a multi-disciplinary discharge planning process, led by the attending physician.  Recommendations may be updated based on patient status, additional functional criteria and insurance authorization.  Follow Up Recommendations  Acute inpatient rehab (3hours/day)     Assistance Recommended at Discharge Frequent or constant Supervision/Assistance  Patient can return home with the following A lot of help with walking and/or transfers;A lot of help with bathing/dressing/bathroom;Assistance with cooking/housework;Assist for transportation;Help with stairs or ramp for entrance   Equipment Recommendations  Other (comment);BSC/3in1;Wheelchair (measurements PT);Wheelchair cushion (measurements PT) (L platform RW)    Recommendations for Other Services Rehab consult     Precautions / Restrictions Precautions Precautions: Fall Required  Braces or Orthoses: Splint/Cast Restrictions Weight Bearing Restrictions: Yes LUE Weight Bearing: Weight bear through elbow only RLE Weight Bearing: Weight bearing as tolerated LLE Weight Bearing: Weight bearing as tolerated Other Position/Activity Restrictions: no weightbearing on L hand. PT has issued platform walker.     Mobility  Bed Mobility Overal bed mobility: Needs Assistance Bed Mobility: Supine to Sit     Supine to sit: Min assist     General bed mobility comments: Cues for initiating BLE movement to edge of bed, assist at trunk, able to scoot self to EOB    Transfers Overall transfer level: Needs assistance Equipment used: Left platform walker Transfers: Sit to/from Stand, Bed to chair/wheelchair/BSC Sit to Stand: Mod assist, Min assist           General transfer comment: Pt standing from edge of bed with mod a, down to min a after practice, cues throughout for hand placement    Ambulation/Gait Ambulation/Gait assistance: Mod assist Gait Distance (Feet): 16 Feet Assistive device: Left platform walker Gait Pattern/deviations: Step-to pattern, Knee flexed in stance - right, Knee flexed in stance - left, Antalgic Gait velocity: decr     General Gait Details: mod assist for cues and safety to steady, cues for RW proximity as pt with tendency to have RW too far away   Stairs             Wheelchair Mobility    Modified Rankin (Stroke Patients Only)       Balance Overall balance assessment: Needs assistance Sitting-balance support: Feet supported Sitting balance-Leahy Scale: Good     Standing balance support: Bilateral upper extremity supported Standing balance-Leahy Scale: Poor Standing balance comment: reliant on external support by of platform walker, pt able to tolerate extended  time standing for platform walker adjustment                            Cognition Arousal/Alertness: Awake/alert Behavior During Therapy: WFL for  tasks assessed/performed Overall Cognitive Status: Within Functional Limits for tasks assessed                                          Exercises General Exercises - Lower Extremity Ankle Circles/Pumps: Both, 20 reps Long Arc Quad: Both, 10 reps Hip ABduction/ADduction: Both, 10 reps Hip Flexion/Marching: Both, 10 reps    General Comments General comments (skin integrity, edema, etc.): VSS on RA      Pertinent Vitals/Pain Pain Assessment Pain Assessment: Faces Faces Pain Scale: Hurts little more Pain Location: pelvis with movement (particularly on L ) Pain Descriptors / Indicators: Aching Pain Intervention(s): Limited activity within patient's tolerance, Monitored during session, Repositioned    Home Living                          Prior Function            PT Goals (current goals can now be found in the care plan section) Acute Rehab PT Goals Patient Stated Goal: to get out of bed PT Goal Formulation: With patient/family Time For Goal Achievement: 11/06/21    Frequency    Min 5X/week      PT Plan      Co-evaluation              AM-PAC PT "6 Clicks" Mobility   Outcome Measure  Help needed turning from your back to your side while in a flat bed without using bedrails?: A Lot Help needed moving from lying on your back to sitting on the side of a flat bed without using bedrails?: A Lot Help needed moving to and from a bed to a chair (including a wheelchair)?: A Lot Help needed standing up from a chair using your arms (e.g., wheelchair or bedside chair)?: A Lot Help needed to walk in hospital room?: A Lot Help needed climbing 3-5 steps with a railing? : Total 6 Click Score: 11    End of Session Equipment Utilized During Treatment: Gait belt Activity Tolerance: Patient tolerated treatment well Patient left: in chair;with call bell/phone within reach;with family/visitor present Nurse Communication: Mobility status PT Visit  Diagnosis: Pain;Difficulty in walking, not elsewhere classified (R26.2) Pain - part of body:  (pelvis)     Time: 3383-2919 PT Time Calculation (min) (ACUTE ONLY): 24 min  Charges:  $Gait Training: 8-22 mins $Therapeutic Exercise: 8-22 mins                     Tycen Dockter R. PTA Acute Rehabilitation Services Office: [place] 10/28/2021, 12:59 PM

## 2021-10-28 NOTE — Care Management Important Message (Signed)
Important Message  Patient Details  Name: Rick [name] MRN: [id number] Date of Birth: [birth year]   Medicare Important Message Given:  Yes     Orbie Pyo 10/28/2021, 4:14 PM

## 2021-10-28 NOTE — Progress Notes (Signed)
Mobility Specialist Progress Note   10/28/21 1600  Mobility  Activity Transferred from chair to bed  Level of Assistance Minimal assist, patient does 75% or more  Assistive Device Front wheel walker (Platform LUE)  LUE Weight Bearing Weight bear through elbow only  RLE Weight Bearing WBAT  LLE Weight Bearing WBAT  Distance Ambulated (ft) 4 ft  Activity Response Tolerated well  $Mobility charge 1 Mobility   Pt received in chair requesting to get back in bed but agreeable to seated LE exercise before transfer. Tolerated exercises well w/ no complaint. MinA for STS + to get LE in bed d/t gen weakness. Left w/ call bell in reach and bed alarm on.   [name] Falling Mobility Specialist Phone Number [phone]

## 2021-10-28 NOTE — Progress Notes (Addendum)
Mobility Specialist Progress Note   10/28/21 1300  Mobility  Activity Ambulated with assistance in hallway  Level of Assistance Minimal assist, patient does 75% or more  Assistive Device Front wheel walker (Platform LUE)  LUE Weight Bearing Weight bear through elbow only  RLE Weight Bearing WBAT  LLE Weight Bearing WBAT  Distance Ambulated (ft) 110 ft  Activity Response Tolerated well  $Mobility charge 1 Mobility   Received in chair having no complaints and agreeable. Requiring min VC for WB precautions today + minA for STS out of chair d/t gen weakness. +2 assist for chair follow to progress ambulation. X1 seated rest break to reserve energy but not stating any complaints. Returned back to doorway where pt was rolled the rest of the way for convenience. Left call bell in reach and all needs were met.  [name] Falling Mobility Specialist Phone Number [phone]

## 2021-10-28 NOTE — Progress Notes (Signed)
Inpatient Rehabilitation Admissions Coordinator   I await [employer] determination for possible [facility] admit of case I opened on 5/18 at 1226.  Danne Baxter, RN, MSN Rehab Admissions Coordinator [phone] 10/28/2021 8:30 AM

## 2021-10-29 NOTE — NC FL2 (Signed)
[place] LEVEL OF CARE SCREENING TOOL     IDENTIFICATION  Patient Name: Rick [name] Birthdate: 1928/12/17 Sex: male Admission Date (Current Location): 10/22/2021  [facility] and [place] Number:  Herbalist and Address:  [facility], [place] [address] [place], [place] [address]      Provider Number: [id number]  Attending Physician Name and Address:  Md, Trauma, MD  Relative Name and Phone Number:  Krew, Hortman (Spouse)   [phone] (Mobile)    Current Level of Care: Hospital Recommended Level of Care: [place] Prior Approval Number:    Date Approved/Denied:   PASRR Number: [id number]  Discharge Plan: SNF    Current Diagnoses: Patient Active Problem List   Diagnosis Date Noted   Multiple pelvic fractures ([place]) 10/25/2021   Multiple closed pelvic fractures without disruption of pelvic ring ([place]) 10/22/2021   Gastritis and gastroduodenitis    Gastric polyp    GI bleed 05/02/2021   Anemia due to stage 4 chronic kidney disease ([place]) 05/02/2021   Hyponatremia 05/02/2021   Acute on chronic heart failure with preserved ejection fraction (HFpEF) ([place]) 05/02/2021   Cystic lesion of abdominal viscera 12/11/2020   Mitral regurgitation 12/02/2020   Demand ischemia ([place]) 12/02/2020   Aortic atherosclerosis ([place]) 12/01/2020   Hypertensive urgency 12/01/2020   Polycystic kidney disease 12/01/2020   AKI (acute kidney injury) ([place]) superimposed on CKD stage IV 12/01/2020   Glaucoma 12/01/2020   Diarrhea 12/01/2020   Acute hypoxemic respiratory failure ([place]) 12/01/2020   CKD (chronic kidney disease), stage IV ([place]) 12/01/2020   Acute on chronic diastolic CHF (congestive heart failure) ([place]) 11/30/2020   Shoulder pain 05/30/2020   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   Retinal hemorrhage, right eye 05/29/2020   Hypertensive retinopathy, right eye 05/29/2020   Creatinine elevation 11/17/2018   Cough  10/25/2017   Health care maintenance 10/22/2016   Anemia, iron deficiency 04/11/2015   Advance care planning 01/16/2015   Benign paroxysmal positional vertigo 01/16/2015   History of prostate cancer 02/02/2014   Bilateral renal cysts 01/31/2014   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   Family history of malignant neoplasm of gastrointestinal tract 03/04/2012   Medicare annual wellness visit, subsequent 01/23/2012   VENTRAL HERNIA, ASYMPTOMATIC 03/09/2008   TOBACCO ABUSE, HX OF 89/38/1017   HELICOBACTER PYLORI INFECTION 11/25/2006   HYPERCHOLESTEROLEMIA 11/25/2006   Essential hypertension 11/25/2006   GERD 11/25/2006   Hyperglycemia 11/25/2006    Orientation RESPIRATION BLADDER Height & Weight     Self, Time, Situation, Place  Normal Incontinent, External catheter Weight: 149 lb 14.6 oz (68 kg) Height:  '5\' 8"'$  (172.7 cm)  BEHAVIORAL SYMPTOMS/MOOD NEUROLOGICAL BOWEL NUTRITION STATUS      Incontinent Diet (see d/c summary)  AMBULATORY STATUS COMMUNICATION OF NEEDS Skin   Extensive Assist Verbally Other (Comment) (Skin tear, left hand)                       Personal Care Assistance Level of Assistance  Bathing, Feeding, Dressing Bathing Assistance: Limited assistance Feeding assistance: Independent Dressing Assistance: Limited assistance     Functional Limitations Info  Sight, Hearing, Speech Sight Info: Adequate Hearing Info: Adequate Speech Info: Adequate    SPECIAL CARE FACTORS FREQUENCY  PT (By licensed PT), OT (By licensed OT)     PT Frequency: 5x/week OT Frequency: 5x/week            Contractures Contractures Info: Not present  Additional Factors Info  Code Status, Allergies Code Status Info: Full code Allergies Info: Lisinopril, nsaids           Current Medications (10/29/2021):  This is the current hospital active medication list Current Facility-Administered Medications  Medication Dose Route Frequency Provider Last Rate Last Admin   0.9 %   sodium chloride infusion  250 mL Intravenous PRN Norm Parcel, PA-C       acetaminophen (TYLENOL) tablet 650 mg  650 mg Oral Q6H Norm Parcel, PA-C   650 mg at 10/29/21 1206   amLODipine (NORVASC) tablet 10 mg  10 mg Oral Daily Norm Parcel, PA-C   10 mg at 10/29/21 5784   atorvastatin (LIPITOR) tablet 10 mg  10 mg Oral QHS Norm Parcel, PA-C   10 mg at 10/28/21 2132   bacitracin ointment   Topical BID Norm Parcel, [place]   Given at 10/29/21 6962   bisacodyl (DULCOLAX) suppository 10 mg  10 mg Rectal Daily PRN Margie Billet A, PA-C       calcium carbonate (TUMS - dosed in mg elemental calcium) chewable tablet 200 mg of elemental calcium  1 tablet Oral BID Meuth, Brooke A, PA-C   200 mg of elemental calcium at 10/29/21 0839   docusate sodium (COLACE) capsule 100 mg  100 mg Oral BID Norm Parcel, PA-C   100 mg at 10/29/21 9528   feeding supplement (ENSURE ENLIVE / ENSURE PLUS) liquid 237 mL  237 mL Oral BID BM Meuth, Brooke A, PA-C   237 mL at 10/29/21 1310   ferrous sulfate tablet 325 mg  325 mg Oral Q breakfast Norm Parcel, PA-C   325 mg at 10/29/21 4132   heparin injection 5,000 Units  5,000 Units Subcutaneous Q8H Norm Parcel, PA-C   5,000 Units at 10/29/21 1310   latanoprost (XALATAN) 0.005 % ophthalmic solution 1 drop  1 drop Both Eyes QHS Norm Parcel, PA-C   1 drop at 10/28/21 2135   levothyroxine (SYNTHROID) tablet 25 mcg  25 mcg Oral Q0600 Norm Parcel, PA-C   25 mcg at 10/29/21 0530   losartan (COZAAR) tablet 100 mg  100 mg Oral Daily Norm Parcel, PA-C   100 mg at 10/29/21 4401   morphine (PF) 2 MG/ML injection 2 mg  2 mg Intravenous Q2H PRN Norm Parcel, PA-C       ondansetron (ZOFRAN-ODT) disintegrating tablet 4 mg  4 mg Oral Q6H PRN Norm Parcel, PA-C       Or   ondansetron [facility]) injection 4 mg  4 mg Intravenous Q6H PRN Norm Parcel, PA-C       oxyCODONE (Oxy IR/ROXICODONE) immediate release tablet 2.5-5 mg  2.5-5 mg Oral Q4H  PRN Barkley Boards R, PA-C   5 mg at 10/23/21 1059   pantoprazole (PROTONIX) EC tablet 40 mg  40 mg Oral Daily Norm Parcel, PA-C   40 mg at 10/29/21 0272   polyethylene glycol (MIRALAX / GLYCOLAX) packet 17 g  17 g Oral BID Meuth, Brooke A, PA-C   17 g at 10/29/21 0840   senna-docusate (Senokot-S) tablet 1 tablet  1 tablet Oral QHS PRN Norm Parcel, PA-C   1 tablet at 10/25/21 2208   sodium bicarbonate tablet 1,300 mg  1,300 mg Oral BID Norm Parcel, PA-C   1,300 mg at 10/29/21 5366   sodium chloride flush (NS) 0.9 % injection 3 mL  3 mL Intravenous Q12H  Barkley Boards R, PA-C   3 mL at 10/29/21 9611   sodium chloride flush (NS) 0.9 % injection 3 mL  3 mL Intravenous PRN Barkley Boards R, PA-C       sodium chloride tablet 2 g  2 g Oral TID WC Meuth, Brooke A, PA-C   2 g at 10/29/21 1207   timolol (TIMOPTIC) 0.5 % ophthalmic solution 1 drop  1 drop Both Eyes BID Norm Parcel, PA-C   1 drop at 10/29/21 6435     Discharge Medications: Please see discharge summary for a list of discharge medications.  Relevant Imaging Results:  Relevant Lab Results:   Additional Information SSN [place], [place]

## 2021-10-29 NOTE — Progress Notes (Signed)
Patient ID: Rick [name], male   DOB: 02-26-1929, 88 y.o.   MRN: [id number] [facility] Surgery Progress Note     Subjective: CC-  No complaints. Denies pain. Tolerating diet. Asking for help with his oatmeal.  Objective: Vital signs in last 24 hours: Temp:  [97.5 F (36.4 C)-97.9 F (36.6 C)] 97.8 F (36.6 C) (05/23 0328) Pulse Rate:  [66-85] 78 (05/23 0328) Resp:  [17-18] 18 (05/23 0328) BP: (129-161)/(81-108) 129/85 (05/23 0328) SpO2:  [96 %-98 %] 97 % (05/23 0328) Last BM Date : 10/27/21  Intake/Output from previous day: 05/22 0701 - 05/23 0700 In: 360 [P.O.:360] Out: 1850 [Urine:1850] Intake/Output this shift: No intake/output data recorded.  PE: Gen:  Alert, NAD, pleasant Card:  RRR, no lower extremity edema. Pulm:  CTAB, no W/R/R, rate and effort normal on room air Abd: Soft, NT/ND Ext:  ulnar gutter splint to LUE, fingers WWP with minimal edema. calves soft and nontender  Lab Results:   CMP     Component Value Date/Time   NA 132 (L) 10/26/2021 0311   NA 128 (L) 08/19/2021 1600   K 4.0 10/26/2021 0311   CL 101 10/26/2021 0311   CO2 23 10/26/2021 0311   GLUCOSE 82 10/26/2021 0311   BUN 20 10/26/2021 0311   BUN 20 08/19/2021 1600   CREATININE 2.26 (H) 10/26/2021 0311   CALCIUM 8.1 (L) 10/26/2021 0311   PROT 6.8 10/22/2021 1331   PROT 8.2 08/19/2021 1600   ALBUMIN 1.9 (L) 10/22/2021 1331   ALBUMIN 3.0 (L) 08/19/2021 1600   AST 15 10/22/2021 1331   ALT 11 10/22/2021 1331   ALKPHOS 59 10/22/2021 1331   BILITOT 0.4 10/22/2021 1331   BILITOT 0.4 08/19/2021 1600   GFRNONAA 27 (L) 10/26/2021 0311   GFRAA 49 (L) 01/11/2015 2208   Lipase     Component Value Date/Time   LIPASE 37 11/30/2020 1107   Anti-infectives: Anti-infectives (From admission, onward)    None      Assessment/Plan Ran over by tractor Pevlic Fx's - Ortho consulted, WBAT, PT/OT, pain control L 5th MCP fx - ortho consulted, ulnar gutter splint, WBAT through L elbow Hx of  hypothyroidism - home meds Hx HLD - home meds   Hx HTN - home meds   Renal cysts with CKD - Cr 2.26 stable (5/20), baseline Cr appears around 2.5-2.8 Lower Extremity Edema - calves soft/ nontender, improvement after Lasix IV x1 on 5/17 GERD - protonix Hypocalcemia - Ca 8.1 (5/20), continue tums Hyponatremia - Na 132 (5/20), improving. Continue salt tabs 2g TID   FEN - HH diet, Ensure, miralax BID VTE - SCDs, SQH 5/17 ID - None Foley - None    Dispo - Continue therapies. Medically stable for CIR when insurance auth complete and bed available.    I reviewed therapy notes, last 24 h vitals and pain scores, last 48 h intake and output.    LOS: 4 days    [facility] 10/29/2021, 7:41 AM Please see Amion for pager number during day hours 7:00am-4:30pm

## 2021-10-29 NOTE — TOC Initial Note (Addendum)
Transition of Care [facility]) - Initial/Assessment Note    Patient Details  Name: Rick [name] MRN: [id number] Date of Birth: April 14, 1929  [facility]) CM/SW Contact:    Marilu Favre, RN Phone Number: 10/29/2021, 2:13 PM  Clinical Narrative:                 Spoke to patient , wife Rick [name] and family at beside.   Insurance denied inpatient rehab stay. Patient, wife and family aware.   Discussed going home with home health PT/OT , lt platform walker, wheel chair and bedside commode vs SNF .   Patient and family would like short term rehab at SNF prior to going home.   TOC SW will start SNF process, and provide bed offers once received   Expected Discharge Plan: [place] Barriers to Discharge: Continued Medical Work up   Patient Goals and CMS Choice   CMS Medicare.gov Compare Post Acute Care list provided to:: Patient Choice offered to / list presented to : Patient, Spouse  Expected Discharge Plan and Services Expected Discharge Plan: [place]   Discharge Planning Services: CM Consult Post Acute Care Choice: IP Rehab Living arrangements for the past 2 months: Single Family Home                                      Prior Living Arrangements/Services Living arrangements for the past 2 months: Single Family Home Lives with:: Spouse Patient language and need for interpreter reviewed:: Yes Do you feel safe going back to the place where you live?: Yes      Need for Family Participation in Patient Care: Yes (Comment) Care giver support system in place?: Yes (comment)   Criminal Activity/Legal Involvement Pertinent to Current Situation/Hospitalization: No - Comment as needed  Activities of Daily Living Home Assistive Devices/Equipment: Cane (specify quad or straight) ADL Screening (condition at time of admission) Patient's cognitive ability adequate to safely complete daily activities?: Yes Is the patient deaf or have  difficulty hearing?: No Does the patient have difficulty seeing, even when wearing glasses/contacts?: No Does the patient have difficulty concentrating, remembering, or making decisions?: No Patient able to express need for assistance with ADLs?: Yes Does the patient have difficulty dressing or bathing?: No Independently performs ADLs?: Yes (appropriate for developmental age) Does the patient have difficulty walking or climbing stairs?: No Weakness of Legs: None Weakness of Arms/Hands: None  Permission Sought/Granted   Permission granted to share information with : Yes, Verbal Permission Granted  Share Information with NAME: wife Rick [name]           Emotional Assessment Appearance:: Appears stated age Attitude/Demeanor/Rapport: Engaged Affect (typically observed): Accepting Orientation: : Oriented to Self, Oriented to Place, Oriented to  Time, Oriented to Situation Alcohol / Substance Use: Not Applicable Psych Involvement: No (comment)  Admission diagnosis:  Chronic anemia [D64.9] Multiple closed pelvic fractures without disruption of pelvic ring (HCC) [S32.82XA] Accidental fall, initial encounter [B26.XXXA] Bilateral pubic rami fractures, closed, initial encounter ([place]) [S32.591A, S32.592A] Stage 4 chronic kidney disease ([place]) [N18.4] Blunt traumatic injury of thoraco-abdomino-pelvic region [S29.9XXA, S39.91XA, S39.93XA] Multiple pelvic fractures ([place]) [S32.82XA] Patient Active Problem List   Diagnosis Date Noted   Multiple pelvic fractures ([place]) 10/25/2021   Multiple closed pelvic fractures without disruption of pelvic ring ([place]) 10/22/2021   Gastritis and gastroduodenitis    Gastric polyp    GI bleed 05/02/2021  Anemia due to stage 4 chronic kidney disease ([place]) 05/02/2021   Hyponatremia 05/02/2021   Acute on chronic heart failure with preserved ejection fraction (HFpEF) ([place]) 05/02/2021   Cystic lesion of abdominal viscera 12/11/2020   Mitral regurgitation 12/02/2020    Demand ischemia ([place]) 12/02/2020   Aortic atherosclerosis ([place]) 12/01/2020   Hypertensive urgency 12/01/2020   Polycystic kidney disease 12/01/2020   AKI (acute kidney injury) ([place]) superimposed on CKD stage IV 12/01/2020   Glaucoma 12/01/2020   Diarrhea 12/01/2020   Acute hypoxemic respiratory failure ([place]) 12/01/2020   CKD (chronic kidney disease), stage IV ([place]) 12/01/2020   Acute on chronic diastolic CHF (congestive heart failure) ([place]) 11/30/2020   Shoulder pain 05/30/2020   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   Retinal hemorrhage, right eye 05/29/2020   Hypertensive retinopathy, right eye 05/29/2020   Creatinine elevation 11/17/2018   Cough 10/25/2017   Health care maintenance 10/22/2016   Anemia, iron deficiency 04/11/2015   Advance care planning 01/16/2015   Benign paroxysmal positional vertigo 01/16/2015   History of prostate cancer 02/02/2014   Bilateral renal cysts 01/31/2014   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   Family history of malignant neoplasm of gastrointestinal tract 03/04/2012   Medicare annual wellness visit, subsequent 01/23/2012   VENTRAL HERNIA, ASYMPTOMATIC 03/09/2008   TOBACCO ABUSE, HX OF 97/35/3299   HELICOBACTER PYLORI INFECTION 11/25/2006   HYPERCHOLESTEROLEMIA 11/25/2006   Essential hypertension 11/25/2006   GERD 11/25/2006   Hyperglycemia 11/25/2006   PCP:  Tonia Ghent, MD Pharmacy:   [id number] [place] [place] [id number]: [phone]: [phone] [facility] [address]Phone: [phone]: [phone]    Social Determinants of Health (SDOH) Interventions    Readmission Risk Interventions     View : No data to display.

## 2021-10-29 NOTE — Progress Notes (Signed)
Physical Therapy Treatment Patient Details Name: Rick [name] MRN: [id number] DOB: 04/11/1929 Today's Date: 10/29/2021   History of Present Illness Pt is a 88 y.o. M who presents 10/22/2021 after being run over by tractor with pelvic fractures, L 5th MCP fx. Significant PMH: prostate CA, HLD.    PT Comments    Pt received standing in room on platform RW with OTA at end of OT session, and agreeable to PT session.  Pt able to demonstrate ambulation with min assist down to min guard in hall with cues for RW proximity with pt able to correct. Pt continues to have most difficulty with sit<>stand from low surface as pt with WB restrictions and unable to push from L hand. Repeated practice with cues for pushing off recliner arm rest with elbow and pt able to progress to down to min guard for last trial. Pt with good tolerance for standing therex for increased ROM and strength. Pt continues to benefit from skilled PT services to progress toward functional mobility goals.     Recommendations for follow up therapy are one component of a multi-disciplinary discharge planning process, led by the attending physician.  Recommendations may be updated based on patient status, additional functional criteria and insurance authorization.  Follow Up Recommendations  Acute inpatient rehab (3hours/day)     Assistance Recommended at Discharge Frequent or constant Supervision/Assistance  Patient can return home with the following A lot of help with walking and/or transfers;A lot of help with bathing/dressing/bathroom;Assistance with cooking/housework;Assist for transportation;Help with stairs or ramp for entrance   Equipment Recommendations  Other (comment);BSC/3in1;Wheelchair (measurements PT);Wheelchair cushion (measurements PT) (L platform RW)    Recommendations for Other Services Rehab consult     Precautions / Restrictions Precautions Precautions: Fall Required Braces or Orthoses:  Splint/Cast Restrictions Weight Bearing Restrictions: Yes LUE Weight Bearing: Weight bear through elbow only RLE Weight Bearing: Weight bearing as tolerated LLE Weight Bearing: Weight bearing as tolerated Other Position/Activity Restrictions: no weightbearing on L hand. PT has issued platform walker.     Mobility  Bed Mobility Overal bed mobility: Needs Assistance Bed Mobility: Supine to Sit     Supine to sit: Min assist     General bed mobility comments: OOB in recliner    Transfers Overall transfer level: Needs assistance Equipment used: Left platform walker Transfers: Sit to/from Stand Sit to Stand: Mod assist, Min guard           General transfer comment: initial MOD A to rise from recliner with cues for hand placement d/t WB restrictions, down to min guard with practice x5    Ambulation/Gait Ambulation/Gait assistance: Min guard, Min assist Gait Distance (Feet): 100 Feet Assistive device: Left platform walker Gait Pattern/deviations: Knee flexed in stance - right, Knee flexed in stance - left, Antalgic, Step-through pattern Gait velocity: decr     General Gait Details: min assist for cues and safety to steady, cues for RW proximity as pt with tendency to have RW too far away   Stairs             Wheelchair Mobility    Modified Rankin (Stroke Patients Only)       Balance Overall balance assessment: Needs assistance Sitting-balance support: Feet supported Sitting balance-Leahy Scale: Good     Standing balance support: Bilateral upper extremity supported Standing balance-Leahy Scale: Poor Standing balance comment: reliant on BUE support  Cognition Arousal/Alertness: Awake/alert Behavior During Therapy: WFL for tasks assessed/performed Overall Cognitive Status: Within Functional Limits for tasks assessed                                          Exercises General Exercises - Lower  Extremity Hip Flexion/Marching: Both, 10 reps, Standing Heel Raises: Both, 10 reps Mini-Sqauts: 10 reps, Standing    General Comments General comments (skin integrity, edema, etc.): wife present during session      Pertinent Vitals/Pain Pain Assessment Pain Assessment: Faces Faces Pain Scale: Hurts a little bit Pain Location: pelvis with movement (particularly on L ) Pain Descriptors / Indicators: Grimacing Pain Intervention(s): Limited activity within patient's tolerance, Monitored during session    Home Living                          Prior Function            PT Goals (current goals can now be found in the care plan section) Acute Rehab PT Goals Patient Stated Goal: to get out of bed PT Goal Formulation: With patient/family Time For Goal Achievement: 11/06/21    Frequency    Min 5X/week      PT Plan      Co-evaluation              AM-PAC PT "6 Clicks" Mobility   Outcome Measure  Help needed turning from your back to your side while in a flat bed without using bedrails?: A Lot Help needed moving from lying on your back to sitting on the side of a flat bed without using bedrails?: A Lot Help needed moving to and from a bed to a chair (including a wheelchair)?: A Lot Help needed standing up from a chair using your arms (e.g., wheelchair or bedside chair)?: A Little Help needed to walk in hospital room?: A Little Help needed climbing 3-5 steps with a railing? : Total 6 Click Score: 13    End of Session Equipment Utilized During Treatment: Gait belt Activity Tolerance: Patient tolerated treatment well Patient left: in chair;with call bell/phone within reach;with family/visitor present Nurse Communication: Mobility status PT Visit Diagnosis: Pain;Difficulty in walking, not elsewhere classified (R26.2) Pain - part of body:  (pelvis)     Time: 0630-1601 PT Time Calculation (min) (ACUTE ONLY): 18 min  Charges:  $Gait Training: 8-22 mins                      Audry Riles. PTA Acute Rehabilitation Services Office: [place] 10/29/2021, 4:06 PM

## 2021-10-29 NOTE — Progress Notes (Signed)
Mobility Specialist Progress Note:   10/29/21 0940  Mobility  Activity Ambulated with assistance to bathroom  Level of Assistance Minimal assist, patient does 75% or more  LUE Weight Bearing Weight bear through elbow only  Distance Ambulated (ft) 30 ft  Activity Response Tolerated well  $Mobility charge 1 Mobility   Pt requesting to go to BR. Required minA throughout. Will f/u for hopeful ambulation.  Rick [name] Acute Rehab Secure Chat or Office Phone: [phone]

## 2021-10-29 NOTE — Progress Notes (Signed)
Inpatient Rehabilitation Admissions Coordinator   [employer] MD is reviewing his case today therefore a determination for rehab venue likely today. I have updated acute team and TOC.  Danne Baxter, RN, MSN Rehab Admissions Coordinator [phone] 10/29/2021 10:03 AM

## 2021-10-29 NOTE — Progress Notes (Signed)
Mobility Specialist Progress Note:   10/29/21 0955  Mobility  Activity Ambulated with assistance in room  Level of Assistance Minimal assist, patient does 75% or more  Assistive Device Front wheel walker (platform)  LUE Weight Bearing Weight bear through elbow only  Distance Ambulated (ft) 30 ft  Activity Response Tolerated well  $Mobility charge 1 Mobility   Pt eager for ambulation. Fatigued from [facility] on toilet, pt able to ambulate short room distance. Required verbal cues to maintain WB status. Left in chair with chair alarm on.   Nelta Numbers Acute Rehab Secure Chat or Office Phone: [phone]

## 2021-10-29 NOTE — Progress Notes (Signed)
Inpatient Rehabilitation Admissions Coordinator   Insurance has denied CIR admit. Not in need to be CIR level acute hospital admit. I spoke to his wife , [name], by phone and she is aware. She questions whether they will pursue [place] vs SNF. I recommend she discuss with her children to what level they can manage at home. I have alerted acute team. We will sign off.  Danne Baxter, RN, MSN Rehab Admissions Coordinator [phone] 10/29/2021 1:47 PM

## 2021-10-29 NOTE — Consult Note (Signed)
   [facility] Inpatient Consult   10/29/2021  Rick [name] 10/20/1928 [id number]  [place] Organization [ACO] Patient: [name]  Primary Care Provider:  Tonia Ghent, MD, [facility]   Patient screened for length of stay hospitalization.   Review of patient's medical record reveals patient is being recommended for an inpatient rehab transition due to pelvic fractures.   Plan:  Continue to follow progress and disposition to assess for post hospital care management needs.    For questions contact:   Natividad Brood, RN BSN [facility] Liaison  [phone] business mobile phone Toll free office [phone]  Fax number: [phone] [email or web address]

## 2021-10-29 NOTE — Progress Notes (Signed)
Occupational Therapy Treatment Patient Details Name: MARIAH HARN MRN: [id number] DOB: [birth year] Today's Date: 10/29/2021   History of present illness Pt is a 88 y.o. M who presents 10/22/2021 after being run over by tractor with pelvic fractures, L 5th MCP fx. Significant PMH: prostate CA, HLD.   OT comments   Pt greeted seated in recliner motivated to participate in therapy. Noted DC change in plan as pts insurance denied AIR, new plan for DC to SNF prior to returning home with family support; collaborated with OTR about change in [place] disposition. Pt progressing excellent functional mobility able to complete functional ambulation in room with L platform Rw with min guard assist, pt needs MOD A to power into standing and MIN cues for hand placement. Pt continues to present with increased pain, decreased activity tolerance, impaired LB strength/endurance. Pt would continue to benefit from skilled occupational therapy while admitted and after d/c to address the below listed limitations in order to improve overall functional mobility and facilitate independence with BADL participation. DC plan remains appropriate, will follow acutely per POC.      Recommendations for follow up therapy are one component of a multi-disciplinary discharge planning process, led by the attending physician.  Recommendations may be updated based on patient status, additional functional criteria and insurance authorization.    Follow Up Recommendations  Skilled nursing-short term rehab (<3 hours/day)    Assistance Recommended at Discharge Frequent or constant Supervision/Assistance  Patient can return home with the following  A little help with walking and/or transfers;A little help with bathing/dressing/bathroom;Assistance with cooking/housework;Assist for transportation   Equipment Recommendations  Other (comment) (TBD)    Recommendations for Other Services      Precautions / Restrictions  Precautions Precautions: Fall Required Braces or Orthoses: Splint/Cast Restrictions Weight Bearing Restrictions: Yes LUE Weight Bearing: Weight bear through elbow only RLE Weight Bearing: Weight bearing as tolerated LLE Weight Bearing: Weight bearing as tolerated       Mobility Bed Mobility               General bed mobility comments: OOB in recliner    Transfers Overall transfer level: Needs assistance Equipment used: Left platform walker Transfers: Sit to/from Stand Sit to Stand: Mod assist           General transfer comment: initial MOD A to rise from recliner with cues for hand placement d/t WB restrictions     Balance Overall balance assessment: Needs assistance Sitting-balance support: Feet supported Sitting balance-Leahy Scale: Good     Standing balance support: Bilateral upper extremity supported Standing balance-Leahy Scale: Poor Standing balance comment: reliant on BUE support                           ADL either performed or assessed with clinical judgement   ADL Overall ADL's : Needs assistance/impaired                         Toilet Transfer: Min guard;Ambulation;Rolling walker (2 wheels) Toilet Transfer Details (indicate cue type and reason): simulated via functional mobility; MOD A for initial sit>stand         Functional mobility during ADLs: Minimal assistance;Moderate assistance;Rolling walker (2 wheels) General ADL Comments: emphasis on functional mobility, increasing activity tolerance and dynamic balance for ADL participation    Extremity/Trunk Assessment Upper Extremity Assessment Upper Extremity Assessment: LUE deficits/detail LUE Deficits / Details: wrist is splinted with 4th and 5th  digits secured as well LUE Sensation: WNL LUE Coordination:  (d/t splint)   Lower Extremity Assessment Lower Extremity Assessment: Defer to PT evaluation        Vision Baseline Vision/History: 1 Wears glasses;3  Glaucoma Patient Visual Report: No change from baseline Vision Assessment?: No apparent visual deficits   Perception Perception Perception: Within Functional Limits   Praxis Praxis Praxis: Intact    Cognition Arousal/Alertness: Awake/alert Behavior During Therapy: WFL for tasks assessed/performed Overall Cognitive Status: Within Functional Limits for tasks assessed                                          Exercises      Shoulder Instructions       General Comments wife present during session    Pertinent Vitals/ Pain       Pain Assessment Pain Assessment: Faces Faces Pain Scale: Hurts a little bit Pain Location: pelvis with movement (particularly on L ) Pain Descriptors / Indicators: Grimacing Pain Intervention(s): Limited activity within patient's tolerance, Monitored during session, Repositioned  Home Living                                          Prior Functioning/Environment              Frequency  Min 2X/week        Progress Toward Goals  OT Goals(current goals can now be found in the care plan section)  Progress towards OT goals: Progressing toward goals  Acute Rehab OT Goals Patient Stated Goal: to go to rehab OT Goal Formulation: With patient Time For Goal Achievement: 11/06/21 Potential to Achieve Goals: Good  Plan Discharge plan needs to be updated;Frequency needs to be updated    Co-evaluation                 AM-PAC OT "6 Clicks" Daily Activity     Outcome Measure   Help from another person eating meals?: A Little Help from another person taking care of personal grooming?: A Little Help from another person toileting, which includes using toliet, bedpan, or urinal?: A Little Help from another person bathing (including washing, rinsing, drying)?: A Little Help from another person to put on and taking off regular upper body clothing?: A Little Help from another person to put on and taking off  regular lower body clothing?: A Little 6 Click Score: 18    End of Session Equipment Utilized During Treatment: Rolling walker (2 wheels)  OT Visit Diagnosis: Unsteadiness on feet (R26.81)   Activity Tolerance Patient tolerated treatment well   Patient Left Other (comment) (handed off to PT)   Nurse Communication Mobility status;Other (comment) (RN present during session)        Time: [phone] OT Time Calculation (min): 12 min  Charges: OT General Charges $OT Visit: 1 Visit OT Treatments $Self Care/Home Management : 8-22 mins  Harley Alto., COTA/L Acute Rehabilitation Services [phone]   Precious Haws 10/29/2021, 3:34 PM

## 2021-10-30 NOTE — Progress Notes (Addendum)
Patient ID: Rick [name], male   DOB: 20-Feb-1929, 88 y.o.   MRN: [id number] [facility] Surgery Progress Note     Subjective: CC-  No complaints.up in the chair eating breakfast.   Objective: Vital signs in last 24 hours: Temp:  [97.4 F (36.3 C)-98 F (36.7 C)] 97.5 F (36.4 C) (05/24 0821) Pulse Rate:  [67-95] 95 (05/24 0821) Resp:  [17-19] 17 (05/24 0821) BP: (123-147)/(78-91) 145/91 (05/24 0821) SpO2:  [96 %-100 %] 96 % (05/24 0530) Last BM Date :  (unknown)  Intake/Output from previous day: 05/23 0701 - 05/24 0700 In: 170 [P.O.:170] Out: 250 [Urine:250] Intake/Output this shift: No intake/output data recorded.  PE: Gen:  Alert, NAD, pleasant Card:  RRR, no lower extremity edema. Pulm:  CTAB posterior lung fields, no W/R/R, rate and effort normal on room air Ext:  ulnar gutter splint to LUE, fingers WWP with minimal edema. calves soft and nontender  Lab Results:   CMP     Component Value Date/Time   NA 132 (L) 10/26/2021 0311   NA 128 (L) 08/19/2021 1600   K 4.0 10/26/2021 0311   CL 101 10/26/2021 0311   CO2 23 10/26/2021 0311   GLUCOSE 82 10/26/2021 0311   BUN 20 10/26/2021 0311   BUN 20 08/19/2021 1600   CREATININE 2.26 (H) 10/26/2021 0311   CALCIUM 8.1 (L) 10/26/2021 0311   PROT 6.8 10/22/2021 1331   PROT 8.2 08/19/2021 1600   ALBUMIN 1.9 (L) 10/22/2021 1331   ALBUMIN 3.0 (L) 08/19/2021 1600   AST 15 10/22/2021 1331   ALT 11 10/22/2021 1331   ALKPHOS 59 10/22/2021 1331   BILITOT 0.4 10/22/2021 1331   BILITOT 0.4 08/19/2021 1600   GFRNONAA 27 (L) 10/26/2021 0311   GFRAA 49 (L) 01/11/2015 2208   Lipase     Component Value Date/Time   LIPASE 37 11/30/2020 1107   Anti-infectives: Anti-infectives (From admission, onward)    None      Assessment/Plan Ran over by tractor Pevlic Fx's - Ortho consulted, WBAT, PT/OT, pain control L 5th MCP fx - ortho consulted, ulnar gutter splint, WBAT through L elbow Hx of hypothyroidism - home  meds Hx HLD - home meds   Hx HTN - home meds   Renal cysts with CKD - Cr 2.26 stable (5/20), baseline Cr appears around 2.5-2.8 Lower Extremity Edema - calves soft/ nontender, improvement after Lasix IV x1 on 5/17 GERD - protonix Hypocalcemia - Ca 8.1 (5/20), continue tums Hyponatremia - Na 132 (5/20), improving. Continue salt tabs 2g TID   FEN - HH diet, Ensure, miralax BID VTE - SCDs, SQH 5/17 ID - None Foley - None    Dispo - Continue therapies. Denied by CIR. I will have PT evaluate again today. OT recommending SNF. He will need SNF vs HH.   I reviewed therapy notes, last 24 h vitals and pain scores, last 48 h intake and output.    LOS: 5 days    [facility] 10/30/2021, 9:06 AM Please see Amion for pager number during day hours 7:00am-4:30pm

## 2021-10-30 NOTE — Progress Notes (Signed)
Physical Therapy Treatment Patient Details Name: Rick [name] MRN: [id number] DOB: Dec 02, 1928 Today's Date: 10/30/2021   History of Present Illness Pt is a 88 y.o. M who presents 10/22/2021 after being run over by tractor with pelvic fractures, L 5th MCP fx. Significant PMH: prostate CA, HLD.    PT Comments    Pt received OOB in recliner and agreeable to session with continued progress. Pt requiring mod a to come to standing from recliner on first attempt, with repeated practice pt able to power up with min guard for safety, cues needed for hand placement and weight shift. Pt demonstrating increased stability with ambulation, needing min guard for safety. Pt with good tolerance for standing LE therex for increased ROM and strength. Pt continues to benefit from skilled PT services to progress toward functional mobility goals.     Recommendations for follow up therapy are one component of a multi-disciplinary discharge planning process, led by the attending physician.  Recommendations may be updated based on patient status, additional functional criteria and insurance authorization.  Follow Up Recommendations  Acute inpatient rehab (3hours/day)     Assistance Recommended at Discharge Frequent or constant Supervision/Assistance  Patient can return home with the following A lot of help with walking and/or transfers;A lot of help with bathing/dressing/bathroom;Assistance with cooking/housework;Assist for transportation;Help with stairs or ramp for entrance   Equipment Recommendations  Other (comment);BSC/3in1;Wheelchair (measurements PT);Wheelchair cushion (measurements PT) (L platform RW)    Recommendations for Other Services Rehab consult     Precautions / Restrictions Precautions Precautions: Fall Required Braces or Orthoses: Splint/Cast Restrictions Weight Bearing Restrictions: Yes LUE Weight Bearing: Weight bear through elbow only RLE Weight Bearing: Weight bearing as  tolerated LLE Weight Bearing: Weight bearing as tolerated Other Position/Activity Restrictions: no weightbearing on L hand. PT has issued platform walker.     Mobility  Bed Mobility Overal bed mobility: Needs Assistance Bed Mobility: Supine to Sit     Supine to sit: Min assist     General bed mobility comments: OOB in recliner    Transfers Overall transfer level: Needs assistance Equipment used: Left platform walker Transfers: Sit to/from Stand Sit to Stand: Mod assist, Min guard           General transfer comment: initial MOD A to rise from recliner with cues for hand placement d/t WB restrictions, down to min guard with practice x3    Ambulation/Gait Ambulation/Gait assistance: Min guard Gait Distance (Feet): 90 Feet Assistive device: Left platform walker Gait Pattern/deviations: Knee flexed in stance - right, Knee flexed in stance - left, Antalgic, Step-through pattern Gait velocity: decr     General Gait Details: light cues at start for RW proximity,  pt quick to fatigue, min guard for safety especially when turning   Stairs             Wheelchair Mobility    Modified Rankin (Stroke Patients Only)       Balance Overall balance assessment: Needs assistance Sitting-balance support: Feet supported Sitting balance-Leahy Scale: Good     Standing balance support: Bilateral upper extremity supported Standing balance-Leahy Scale: Poor Standing balance comment: reliant on BUE support                            Cognition Arousal/Alertness: Awake/alert Behavior During Therapy: WFL for tasks assessed/performed Overall Cognitive Status: Within Functional Limits for tasks assessed  Exercises General Exercises - Lower Extremity Hip ABduction/ADduction: Both, 20 reps, Standing Hip Flexion/Marching: Both, Standing, 20 reps Heel Raises: Both, 10 reps    General Comments General  comments (skin integrity, edema, etc.): daughter present throughout session      Pertinent Vitals/Pain Pain Assessment Pain Assessment: Faces Faces Pain Scale: Hurts a little bit Pain Location: pelvis with movement (particularly on L ) Pain Descriptors / Indicators: Sore Pain Intervention(s): Limited activity within patient's tolerance, Monitored during session    Home Living                          Prior Function            PT Goals (current goals can now be found in the care plan section) Acute Rehab PT Goals Patient Stated Goal: to get out of bed PT Goal Formulation: With patient/family Time For Goal Achievement: 11/06/21    Frequency    Min 5X/week      PT Plan      Co-evaluation              AM-PAC PT "6 Clicks" Mobility   Outcome Measure  Help needed turning from your back to your side while in a flat bed without using bedrails?: A Lot Help needed moving from lying on your back to sitting on the side of a flat bed without using bedrails?: A Lot Help needed moving to and from a bed to a chair (including a wheelchair)?: A Lot Help needed standing up from a chair using your arms (e.g., wheelchair or bedside chair)?: A Little Help needed to walk in hospital room?: A Little Help needed climbing 3-5 steps with a railing? : Total 6 Click Score: 13    End of Session Equipment Utilized During Treatment: Gait belt Activity Tolerance: Patient tolerated treatment well Patient left: in chair;with call bell/phone within reach;with family/visitor present Nurse Communication: Mobility status PT Visit Diagnosis: Pain;Difficulty in walking, not elsewhere classified (R26.2) Pain - part of body:  (pelvis)     Time: 2836-6294 PT Time Calculation (min) (ACUTE ONLY): 17 min  Charges:  $Therapeutic Exercise: 8-22 mins                     [place] Ndiaye R. PTA Acute Rehabilitation Services Office: [place] 10/30/2021, 9:17 AM

## 2021-10-30 NOTE — Progress Notes (Addendum)
Spoke to patients wife, [name], on the phone. She would like to pursue SNF placement at this time, she feels it would be the safest option right now. I will notify our CM/Social Worker.  Obie Dredge, PA-C [facility] Please see Amion for pager number during day hours 7:00am-4:30pm

## 2021-10-30 NOTE — TOC Progression Note (Signed)
Transition of Care [facility]) - Progression Note    Patient Details  Name: Rick [name] MRN: [id number] Date of Birth: Mar 10, 1929  Transition of Care [facility]) CM/SW Contact  Emeterio Reeve, [place] Phone Number: 10/30/2021, 2:02 PM  Clinical Narrative:     CSW gave pt daughter bed offers at bedside. Daughter stated she will review with pt and her mother and make a decision.   Expected Discharge Plan: [place] Barriers to Discharge: Continued Medical Work up  Expected Discharge Plan and Services Expected Discharge Plan: [place]   Discharge Planning Services: CM Consult Post Acute Care Choice: IP Rehab Living arrangements for the past 2 months: Single Family Home                                       Social Determinants of Health (SDOH) Interventions    Readmission Risk Interventions     View : No data to display.         Emeterio Reeve, LCSW Clinical Social Worker

## 2021-10-30 NOTE — Progress Notes (Signed)
Mobility Specialist Progress Note:   10/30/21 1530  Mobility  Activity Ambulated with assistance in hallway  Level of Assistance Minimal assist, patient does 75% or more  Assistive Device Front wheel walker (platform)  LUE Weight Bearing Weight bear through elbow only  Distance Ambulated (ft) 150 ft  Activity Response Tolerated well  $Mobility charge 1 Mobility   Pt eager for mobility session this afternoon. Required minA to stand from recliner + VC to maintain WB status. Pt ambulated with minG, tolerated well. Left in chair with all needs met, chair alarm on.   Nelta Numbers Acute Rehab Secure Chat or Office Phone: [phone]

## 2021-10-31 NOTE — TOC Progression Note (Signed)
Transition of Care [facility]) - Progression Note    Patient Details  Name: KALIK HOARE MRN: [id number] Date of Birth: 1928/07/14  Transition of [facility]) CM/SW Contact  Emeterio Reeve, [place] Phone Number: 10/31/2021, 10:42 AM  Clinical Narrative:     CSW spoke to pts wife about bed choice. Pts wife chose [place] for rehab. CSW confirmed that [facility] can take pt when insurance auth comes back. Pt has [place] has been started. Reference number is [id number].   Expected Discharge Plan: [place] Barriers to Discharge: Continued Medical Work up  Expected Discharge Plan and Services Expected Discharge Plan: [place]   Discharge Planning Services: CM Consult Post Acute Care Choice: IP Rehab Living arrangements for the past 2 months: Single Family Home                                       Social Determinants of Health (SDOH) Interventions    Readmission Risk Interventions     View : No data to display.         Emeterio Reeve, LCSW Clinical Social Worker

## 2021-10-31 NOTE — Progress Notes (Signed)
Patient ID: Rick [name], male   DOB: 1928-10-24, 88 y.o.   MRN: [id number] [facility] Surgery Progress Note     Subjective: CC-  No complaints. Pain controlled. Reports normal bowel and bladder function. Agreeable to SNF.   Objective: Vital signs in last 24 hours: Temp:  [97.4 F (36.3 C)-98 F (36.7 C)] 98 F (36.7 C) (05/25 0751) Pulse Rate:  [70-91] 81 (05/25 0751) Resp:  [17] 17 (05/25 0751) BP: (119-145)/(72-95) 143/84 (05/25 0751) SpO2:  [97 %-100 %] 98 % (05/25 0751) Last BM Date : 10/30/21  Intake/Output from previous day: 05/24 0701 - 05/25 0700 In: 1620 [P.O.:1620] Out: 1200 [Urine:1200] Intake/Output this shift: No intake/output data recorded.  PE: Gen:  Alert, NAD, pleasant Card:  RRR, no lower extremity edema. Pulm:  CTAB posterior lung fields, no W/R/R, rate and effort normal on room air Ext:  ulnar gutter splint to LUE, fingers WWP with minimal edema. calves soft and nontender  Lab Results:   CMP     Component Value Date/Time   NA 132 (L) 10/26/2021 0311   NA 128 (L) 08/19/2021 1600   K 4.0 10/26/2021 0311   CL 101 10/26/2021 0311   CO2 23 10/26/2021 0311   GLUCOSE 82 10/26/2021 0311   BUN 20 10/26/2021 0311   BUN 20 08/19/2021 1600   CREATININE 2.26 (H) 10/26/2021 0311   CALCIUM 8.1 (L) 10/26/2021 0311   PROT 6.8 10/22/2021 1331   PROT 8.2 08/19/2021 1600   ALBUMIN 1.9 (L) 10/22/2021 1331   ALBUMIN 3.0 (L) 08/19/2021 1600   AST 15 10/22/2021 1331   ALT 11 10/22/2021 1331   ALKPHOS 59 10/22/2021 1331   BILITOT 0.4 10/22/2021 1331   BILITOT 0.4 08/19/2021 1600   GFRNONAA 27 (L) 10/26/2021 0311   GFRAA 49 (L) 01/11/2015 2208   Lipase     Component Value Date/Time   LIPASE 37 11/30/2020 1107   Anti-infectives: Anti-infectives (From admission, onward)    None      Assessment/Plan Ran over by tractor Pevlic Fx's - Ortho consulted, WBAT, PT/OT, pain control L 5th MCP fx - ortho consulted, ulnar gutter splint, WBAT through L  elbow Hx of hypothyroidism - home meds Hx HLD - home meds   Hx HTN - home meds   Renal cysts with CKD - Cr 2.26 stable (5/20), baseline Cr appears around 2.5-2.8 Lower Extremity Edema - calves soft/ nontender, improvement after Lasix IV x1 on 5/17 GERD - protonix Hypocalcemia - Ca 8.1 (5/20), continue tums Hyponatremia - Na 132 (5/20), improving. Continue salt tabs 2g TID   FEN - HH diet, Ensure, miralax BID VTE - SCDs, SQH 5/17 ID - None Foley - None    Dispo - Continue therapies. Denied by CIR. CM working on SNF placement.   I reviewed therapy notes, last 24 h vitals and pain scores, last 48 h intake and output.    LOS: 6 days    [place] [facility] 10/31/2021, 8:33 AM Please see Amion for pager number during day hours 7:00am-4:30pm

## 2021-10-31 NOTE — Progress Notes (Signed)
Physical Therapy Treatment Patient Details Name: Rick [name] MRN: [id number] DOB: 06-04-1929 Today's Date: 10/31/2021   History of Present Illness Pt is a 88 y.o. M who presents 10/22/2021 after being run over by tractor with pelvic fractures, L 5th MCP fx. Significant PMH: prostate CA, HLD.    PT Comments    Pt received OOB in recliner on arrival and agreeable to session. Pt continues to require assist to come to standing from low recliner to power up and to steady on rise. Pt with increased stability throughout ambulation this session demonstrating ability to manage RW without physical assist and no LOB. Pt with good tolerance for standing LE therex for increased ROM and strength. Pt continues to be limited by fatigue and deconditioning and continues to benefit from skilled PT services to progress toward functional mobility goals.     Recommendations for follow up therapy are one component of a multi-disciplinary discharge planning process, led by the attending physician.  Recommendations may be updated based on patient status, additional functional criteria and insurance authorization.  Follow Up Recommendations  Acute inpatient rehab (3hours/day)     Assistance Recommended at Discharge Frequent or constant Supervision/Assistance  Patient can return home with the following A lot of help with walking and/or transfers;A lot of help with bathing/dressing/bathroom;Assistance with cooking/housework;Assist for transportation;Help with stairs or ramp for entrance   Equipment Recommendations  Other (comment);BSC/3in1;Wheelchair (measurements PT);Wheelchair cushion (measurements PT) (L platform RW)    Recommendations for Other Services Rehab consult     Precautions / Restrictions Precautions Precautions: Fall Required Braces or Orthoses: Splint/Cast Restrictions Weight Bearing Restrictions: Yes LUE Weight Bearing: Weight bear through elbow only RLE Weight Bearing: Weight bearing as  tolerated LLE Weight Bearing: Weight bearing as tolerated Other Position/Activity Restrictions: no weightbearing on L hand. PT has issued platform walker.     Mobility  Bed Mobility Overal bed mobility: Needs Assistance             General bed mobility comments: OOB in recliner    Transfers Overall transfer level: Needs assistance Equipment used: Left platform walker Transfers: Sit to/from Stand Sit to Stand: Mod assist, Min guard     Squat pivot transfers: Max assist, +2 physical assistance     General transfer comment: initial MOD A to rise from recliner with cues for hand placement d/t WB restrictions, down to min guard with practice x3    Ambulation/Gait Ambulation/Gait assistance: Min guard Gait Distance (Feet): 100 Feet Assistive device: Left platform walker Gait Pattern/deviations: Knee flexed in stance - right, Knee flexed in stance - left, Antalgic, Step-through pattern Gait velocity: decr     General Gait Details: light cues at start for RW proximity,  pt quick to fatigue, min guard for safety especially when turning   Stairs             Wheelchair Mobility    Modified Rankin (Stroke Patients Only)       Balance Overall balance assessment: Needs assistance Sitting-balance support: Feet supported Sitting balance-Leahy Scale: Good     Standing balance support: Bilateral upper extremity supported Standing balance-Leahy Scale: Poor Standing balance comment: reliant on BUE support                            Cognition Arousal/Alertness: Awake/alert Behavior During Therapy: WFL for tasks assessed/performed Overall Cognitive Status: Within Functional Limits for tasks assessed  Exercises General Exercises - Lower Extremity Hip Flexion/Marching: Both, Standing, 20 reps Heel Raises: Both, 10 reps Mini-Sqauts: 10 reps, Standing Other Exercises Other Exercises: reaching  down with HHAx1 to ground to pick up objects x5    General Comments        Pertinent Vitals/Pain Pain Assessment Pain Assessment: No/denies pain    Home Living                          Prior Function            PT Goals (current goals can now be found in the care plan section) Acute Rehab PT Goals Patient Stated Goal: to get out of bed PT Goal Formulation: With patient/family Time For Goal Achievement: 11/06/21    Frequency    Min 5X/week      PT Plan      Co-evaluation              AM-PAC PT "6 Clicks" Mobility   Outcome Measure  Help needed turning from your back to your side while in a flat bed without using bedrails?: A Lot Help needed moving from lying on your back to sitting on the side of a flat bed without using bedrails?: A Lot Help needed moving to and from a bed to a chair (including a wheelchair)?: A Lot Help needed standing up from a chair using your arms (e.g., wheelchair or bedside chair)?: A Little Help needed to walk in hospital room?: A Little Help needed climbing 3-5 steps with a railing? : A Lot 6 Click Score: 14    End of Session Equipment Utilized During Treatment: Gait belt Activity Tolerance: Patient tolerated treatment well Patient left: in chair;with call bell/phone within reach;with family/visitor present Nurse Communication: Mobility status PT Visit Diagnosis: Pain;Difficulty in walking, not elsewhere classified (R26.2) Pain - part of body:  (pelvis)     Time: 7017-7939 PT Time Calculation (min) (ACUTE ONLY): 18 min  Charges:  $Therapeutic Exercise: 8-22 mins                     Yeray Tomas R. PTA Acute Rehabilitation Services Office: [place] 10/31/2021, 2:14 PM

## 2021-10-31 NOTE — Progress Notes (Signed)
Mobility Specialist Progress Note:   10/31/21 1100  Mobility  Activity Ambulated with assistance in hallway  Level of Assistance Minimal assist, patient does 75% or more  Assistive Device Front wheel walker (platform)  LUE Weight Bearing Weight bear through elbow only  Activity Response Tolerated well  $Mobility charge 1 Mobility   Pt eager for mobility session. Required minA to stand from recliner, MinG with gait. No LOB or unsteadiness noted. Pt back in chair with chair alarm on.   Nelta Numbers Acute Rehab Secure Chat or Office Phone: [phone]

## 2021-11-01 DIAGNOSIS — S62307A Unspecified fracture of fifth metacarpal bone, left hand, initial encounter for closed fracture: Secondary | ICD-10-CM | POA: Diagnosis not present

## 2021-11-01 DIAGNOSIS — M6281 Muscle weakness (generalized): Secondary | ICD-10-CM | POA: Diagnosis not present

## 2021-11-01 DIAGNOSIS — S3282XS Multiple fractures of pelvis without disruption of pelvic ring, sequela: Secondary | ICD-10-CM | POA: Diagnosis not present

## 2021-11-01 DIAGNOSIS — N179 Acute kidney failure, unspecified: Secondary | ICD-10-CM | POA: Diagnosis not present

## 2021-11-01 DIAGNOSIS — R41841 Cognitive communication deficit: Secondary | ICD-10-CM | POA: Diagnosis not present

## 2021-11-01 DIAGNOSIS — I5033 Acute on chronic diastolic (congestive) heart failure: Secondary | ICD-10-CM | POA: Diagnosis not present

## 2021-11-01 DIAGNOSIS — S3282XA Multiple fractures of pelvis without disruption of pelvic ring, initial encounter for closed fracture: Secondary | ICD-10-CM | POA: Diagnosis not present

## 2021-11-01 DIAGNOSIS — M6259 Muscle wasting and atrophy, not elsewhere classified, multiple sites: Secondary | ICD-10-CM | POA: Diagnosis not present

## 2021-11-01 DIAGNOSIS — S32591S Other specified fracture of right pubis, sequela: Secondary | ICD-10-CM | POA: Diagnosis not present

## 2021-11-01 DIAGNOSIS — K219 Gastro-esophageal reflux disease without esophagitis: Secondary | ICD-10-CM | POA: Diagnosis not present

## 2021-11-01 DIAGNOSIS — S62307G Unspecified fracture of fifth metacarpal bone, left hand, subsequent encounter for fracture with delayed healing: Secondary | ICD-10-CM | POA: Diagnosis not present

## 2021-11-01 DIAGNOSIS — Z741 Need for assistance with personal care: Secondary | ICD-10-CM | POA: Diagnosis not present

## 2021-11-01 DIAGNOSIS — K5901 Slow transit constipation: Secondary | ICD-10-CM | POA: Diagnosis not present

## 2021-11-01 DIAGNOSIS — S299XXS Unspecified injury of thorax, sequela: Secondary | ICD-10-CM | POA: Diagnosis not present

## 2021-11-01 DIAGNOSIS — S3289XA Fracture of other parts of pelvis, initial encounter for closed fracture: Secondary | ICD-10-CM | POA: Diagnosis not present

## 2021-11-01 DIAGNOSIS — D631 Anemia in chronic kidney disease: Secondary | ICD-10-CM | POA: Diagnosis not present

## 2021-11-01 DIAGNOSIS — R262 Difficulty in walking, not elsewhere classified: Secondary | ICD-10-CM | POA: Diagnosis not present

## 2021-11-01 DIAGNOSIS — Z7401 Bed confinement status: Secondary | ICD-10-CM | POA: Diagnosis not present

## 2021-11-01 DIAGNOSIS — T07XXXA Unspecified multiple injuries, initial encounter: Secondary | ICD-10-CM | POA: Diagnosis not present

## 2021-11-01 DIAGNOSIS — R2689 Other abnormalities of gait and mobility: Secondary | ICD-10-CM | POA: Diagnosis not present

## 2021-11-01 DIAGNOSIS — E039 Hypothyroidism, unspecified: Secondary | ICD-10-CM | POA: Diagnosis not present

## 2021-11-01 DIAGNOSIS — I10 Essential (primary) hypertension: Secondary | ICD-10-CM | POA: Diagnosis not present

## 2021-11-01 DIAGNOSIS — R531 Weakness: Secondary | ICD-10-CM | POA: Diagnosis not present

## 2021-11-01 DIAGNOSIS — S3282XG Multiple fractures of pelvis without disruption of pelvic ring, subsequent encounter for fracture with delayed healing: Secondary | ICD-10-CM | POA: Diagnosis not present

## 2021-11-01 DIAGNOSIS — N184 Chronic kidney disease, stage 4 (severe): Secondary | ICD-10-CM | POA: Diagnosis not present

## 2021-11-01 DIAGNOSIS — D131 Benign neoplasm of stomach: Secondary | ICD-10-CM | POA: Diagnosis not present

## 2021-11-01 DIAGNOSIS — E871 Hypo-osmolality and hyponatremia: Secondary | ICD-10-CM | POA: Diagnosis not present

## 2021-11-01 MED ORDER — POLYETHYLENE GLYCOL 3350 17 GM/SCOOP PO POWD: 0.5000 | Freq: Once | ORAL | 0 refills | Status: AC

## 2021-11-01 MED ORDER — SODIUM BICARBONATE 650 MG PO TABS: 1250.0000 mg | ORAL_TABLET | Freq: Two times a day (BID) | ORAL | Status: DC

## 2021-11-01 MED ORDER — ACETAMINOPHEN 325 MG PO TABS: 650.0000 mg | ORAL_TABLET | Freq: Four times a day (QID) | ORAL | Status: DC | PRN

## 2021-11-01 MED ORDER — POLYETHYLENE GLYCOL 3350 17 GM/SCOOP PO POWD: 17.0000 g | Freq: Every day | ORAL | Status: DC

## 2021-11-01 MED ORDER — BACITRACIN ZINC 500 UNIT/GM EX OINT: TOPICAL_OINTMENT | Freq: Two times a day (BID) | CUTANEOUS | 0 refills | Status: DC

## 2021-11-01 MED ORDER — DOCUSATE SODIUM 100 MG PO CAPS: 100.0000 mg | ORAL_CAPSULE | Freq: Two times a day (BID) | ORAL | 0 refills | Status: DC

## 2021-11-01 MED ORDER — POLYETHYLENE GLYCOL 3350 17 GM/SCOOP PO POWD
0.5000 | Freq: Once | ORAL | Status: DC
  Filled 2021-11-01: qty 255

## 2021-11-01 MED ORDER — SODIUM CHLORIDE 1 G PO TABS: 2.0000 g | ORAL_TABLET | Freq: Three times a day (TID) | ORAL | Status: DC

## 2021-11-01 MED ORDER — BISACODYL 10 MG RE SUPP: 10.0000 mg | Freq: Every day | RECTAL | 0 refills | Status: DC | PRN

## 2021-11-01 MED ORDER — CALCIUM CARBONATE ANTACID 500 MG PO CHEW: 1.0000 | CHEWABLE_TABLET | Freq: Two times a day (BID) | ORAL | Status: DC

## 2021-11-01 MED ORDER — ENSURE ENLIVE PO LIQD: 237.0000 mL | Freq: Two times a day (BID) | ORAL | 12 refills | Status: DC

## 2021-11-01 NOTE — Progress Notes (Signed)
Report called to [place], [place] at [facility].

## 2021-11-01 NOTE — TOC Transition Note (Signed)
Transition of Care [facility]) - CM/SW Discharge Note   Patient Details  Name: DICKY BOER MRN: [id number] Date of Birth: January 17, 1929  Transition of Care [facility]) CM/SW Contact:  Emeterio Reeve, LCSW Phone Number: 11/01/2021, 11:41 AM   Clinical Narrative:     Per MD patient ready for DC to [facility]. RN, patient, patient's family, and facility notified of DC. Discharge Summary and FL2 sent to facility. DC packet on chart. Insurance Josem Kaufmann has been received.  Ambulance transport requested for patient.    RN to call report to [phone]  CSW will sign off for now as social work intervention is no longer needed. Please consult [place] again if new needs arise.   Final next level of care: Skilled Nursing Facility Barriers to Discharge: Barriers Resolved   Patient Goals and CMS Choice   CMS Medicare.gov Compare Post Acute Care list provided to:: Patient Choice offered to / list presented to : Patient, Spouse  Discharge Placement              Patient chooses bed at: [facility] Patient to be transferred to facility by: ptar Name of family member notified: wife, [name] Patient and family notified of of transfer: 11/01/21  Discharge Plan and Services   Discharge Planning Services: CM Consult Post Acute Care Choice: IP Rehab                               Social Determinants of Health (SDOH) Interventions     Readmission Risk Interventions     View : No data to display.          Emeterio Reeve, LCSW Clinical Social Worker

## 2021-11-01 NOTE — Progress Notes (Signed)
Occupational Therapy Treatment Patient Details Name: Rick [name] MRN: [id number] DOB: 27-Feb-1929 Today's Date: 11/01/2021   History of present illness Pt is a 88 y.o. M who presents 10/22/2021 after being run over by tractor with pelvic fractures, L 5th MCP fx. Significant PMH: prostate CA, HLD.   OT comments  Pt. Seen for skilled OT treatment session.  LB dressing strategies for fall prevention. Pt. Able to complete with min a.  Simulated toilet transfer min a.  Good adherence to wbs of LUE with min cues.  Highly motivated and eager to stay active.     Recommendations for follow up therapy are one component of a multi-disciplinary discharge planning process, led by the attending physician.  Recommendations may be updated based on patient status, additional functional criteria and insurance authorization.    Follow Up Recommendations  Skilled nursing-short term rehab (<3 hours/day)    Assistance Recommended at Discharge Frequent or constant Supervision/Assistance  Patient can return home with the following  A little help with walking and/or transfers;A little help with bathing/dressing/bathroom;Assistance with cooking/housework;Assist for transportation   Equipment Recommendations  Other (comment)    Recommendations for Other Services Rehab consult    Precautions / Restrictions Precautions Precautions: Fall Required Braces or Orthoses: Splint/Cast Restrictions Weight Bearing Restrictions: Yes LUE Weight Bearing: Weight bear through elbow only RLE Weight Bearing: Weight bearing as tolerated LLE Weight Bearing: Weight bearing as tolerated Other Position/Activity Restrictions: no weightbearing on L hand. PT has issued platform walker.       Mobility Bed Mobility Overal bed mobility: Needs Assistance Bed Mobility: Supine to Sit     Supine to sit: Min assist          Transfers Overall transfer level: Needs assistance Equipment used: Left platform walker Transfers:  Sit to/from Stand Sit to Stand: Min assist, Min guard                 Balance                                           ADL either performed or assessed with clinical judgement   ADL Overall ADL's : Needs assistance/impaired                     Lower Body Dressing: Minimal assistance;Sitting/lateral leans Lower Body Dressing Details (indicate cue type and reason): introduced crossing legs over knees or bringing sideways into bed vs. bending forward to prevent forward fall-wife and dtr present. pt. states wife also assists prn Toilet Transfer: Min guard;Ambulation;Rolling walker (2 wheels) Toilet Transfer Details (indicate cue type and reason): simulated via functional mobility; MOD A for initial sit>stand         Functional mobility during ADLs: Minimal assistance;Moderate assistance;Rolling walker (2 wheels) General ADL Comments: emphasis on functional mobility, increasing activity tolerance and dynamic balance for ADL participation    Extremity/Trunk Assessment              Vision       Perception     Praxis      Cognition Arousal/Alertness: Awake/alert Behavior During Therapy: WFL for tasks assessed/performed Overall Cognitive Status: Within Functional Limits for tasks assessed  Exercises      Shoulder Instructions       General Comments spouse and daughter present throughout    Pertinent Vitals/ Pain       Pain Assessment Pain Assessment: No/denies pain  Home Living                                          Prior Functioning/Environment              Frequency  Min 2X/week        Progress Toward Goals  OT Goals(current goals can now be found in the care plan section)  Progress towards OT goals: Progressing toward goals     Plan Discharge plan needs to be updated;Frequency needs to be updated    Co-evaluation                  AM-PAC OT "6 Clicks" Daily Activity     Outcome Measure   Help from another person eating meals?: A Little Help from another person taking care of personal grooming?: A Little Help from another person toileting, which includes using toliet, bedpan, or urinal?: A Little Help from another person bathing (including washing, rinsing, drying)?: A Little Help from another person to put on and taking off regular upper body clothing?: A Little Help from another person to put on and taking off regular lower body clothing?: A Little 6 Click Score: 18    End of Session Equipment Utilized During Treatment: Rolling walker (2 wheels)  OT Visit Diagnosis: Unsteadiness on feet (R26.81)   Activity Tolerance Patient tolerated treatment well   Patient Left in chair;with call bell/phone within reach;with family/visitor present   Nurse Communication Other (comment) (dtr. declined chair alarm as her main stated concern was falling forward and this is increased as pt. does not want legs in recliner position only seated feet on floor. wants pt. in bed if they are not present. relayed her request to rn.)        Time: 1044-1106 OT Time Calculation (min): 22 min  Charges: OT General Charges $OT Visit: 1 Visit OT Treatments $Self Care/Home Management : 8-22 mins  Sonia Baller, COTA/L Acute Rehabilitation [phone]   Tanya Nones 11/01/2021, 12:32 PM

## 2021-11-01 NOTE — Progress Notes (Cosign Needed Addendum)
Patient ID: Rick [name], male   DOB: Mar 10, 1929, 88 y.o.   MRN: [id number] [facility] Surgery Progress Note     Subjective: CC-  No complaints. Pain controlled. States he had a BM this morning  Objective: Vital signs in last 24 hours: Temp:  [97.3 F (36.3 C)-98.3 F (36.8 C)] 97.7 F (36.5 C) (05/26 0812) Pulse Rate:  [74-87] 80 (05/26 0812) Resp:  [16-18] 16 (05/26 0812) BP: (122-142)/(71-90) 138/80 (05/26 0812) SpO2:  [96 %-100 %] 98 % (05/26 0812) Last BM Date : 10/31/21  Intake/Output from previous day: 05/25 0701 - 05/26 0700 In: 820 [P.O.:820] Out: 600 [Urine:600] Intake/Output this shift: No intake/output data recorded.  PE: Gen:  Alert, NAD, pleasant Card:  RRR, no lower extremity edema. Pulm:  CTAB anterior lung fields, no W/R/R, rate and effort normal on room air Abd: soft, non-tender, RUQ w/ visible and palpable hard stool in R colon.  Ext:  ulnar gutter splint to LUE, fingers WWP with minimal edema. calves soft and nontender  Lab Results:   CMP     Component Value Date/Time   NA 132 (L) 10/26/2021 0311   NA 128 (L) 08/19/2021 1600   K 4.0 10/26/2021 0311   CL 101 10/26/2021 0311   CO2 23 10/26/2021 0311   GLUCOSE 82 10/26/2021 0311   BUN 20 10/26/2021 0311   BUN 20 08/19/2021 1600   CREATININE 2.26 (H) 10/26/2021 0311   CALCIUM 8.1 (L) 10/26/2021 0311   PROT 6.8 10/22/2021 1331   PROT 8.2 08/19/2021 1600   ALBUMIN 1.9 (L) 10/22/2021 1331   ALBUMIN 3.0 (L) 08/19/2021 1600   AST 15 10/22/2021 1331   ALT 11 10/22/2021 1331   ALKPHOS 59 10/22/2021 1331   BILITOT 0.4 10/22/2021 1331   BILITOT 0.4 08/19/2021 1600   GFRNONAA 27 (L) 10/26/2021 0311   GFRAA 49 (L) 01/11/2015 2208   Lipase     Component Value Date/Time   LIPASE 37 11/30/2020 1107   Anti-infectives: Anti-infectives (From admission, onward)    None      Assessment/Plan Ran over by tractor Pevlic Fx's - Ortho consulted, WBAT, PT/OT, pain control L 5th MCP fx - ortho  consulted, ulnar gutter splint, WBAT through L elbow Hx of hypothyroidism - home meds Hx HLD - home meds   Hx HTN - home meds   Renal cysts with CKD - Cr 2.26 stable (5/20), baseline Cr appears around 2.5-2.8 Lower Extremity Edema - calves soft/ nontender, improvement after Lasix IV x1 on 5/17 GERD - protonix Hypocalcemia - Ca 8.1 (5/20), continue tums Hyponatremia - Na 132 (5/20), improving. Continue salt tabs 2g TID Constipation - has been on miralax BID; give 1/2 bottle of miralax today to help break up hard stool in R/transverse colon. Check BMP in AM FEN - HH diet, Ensure, miralax BID VTE - SCDs, SQH 5/17 ID - None Foley - None    Dispo - Continue therapies. Denied by CIR. CM working on SNF placement. Medically stable for D/C to SNF  I reviewed therapy notes, last 24 h vitals and pain scores, last 48 h intake and output.    LOS: 7 days    [place] [facility] 11/01/2021, 8:41 AM Please see Amion for pager number during day hours 7:00am-4:30pm

## 2021-11-01 NOTE — Progress Notes (Signed)
Physical Therapy Treatment Patient Details Name: Rick [name] MRN: [id number] DOB: [birth year] Today's Date: 11/01/2021   History of Present Illness Pt is a 88 y.o. M who presents 10/22/2021 after being run over by tractor with pelvic fractures, L 5th MCP fx. Significant PMH: prostate CA, HLD.    PT Comments    Pt received OOB in recliner and agreeable to session with continued progress towards goals. Pt able to come to standing from recliner with min a for first trial down to min guard with practice throughout session. Pt continues to fatigue quickly, requiring seated rest break during ambulation secondary to fatigue. Pt with good tolerance for standing therex for increased ROM and strength. Pt still demonstrates unsteadiness throughout ambulation, needing cues for head up and forward gaze, but without LOB. Current plan remains appropriate to address deficits and maximize functional independence and decrease caregiver burden. Pt continues to benefit from skilled PT services to progress toward functional mobility goals.    Recommendations for follow up therapy are one component of a multi-disciplinary discharge planning process, led by the attending physician.  Recommendations may be updated based on patient status, additional functional criteria and insurance authorization.  Follow Up Recommendations  Acute inpatient rehab (3hours/day)     Assistance Recommended at Discharge Frequent or constant Supervision/Assistance  Patient can return home with the following A lot of help with walking and/or transfers;A lot of help with bathing/dressing/bathroom;Assistance with cooking/housework;Assist for transportation;Help with stairs or ramp for entrance   Equipment Recommendations  Other (comment);BSC/3in1;Wheelchair (measurements PT);Wheelchair cushion (measurements PT) (L platform RW)    Recommendations for Other Services Rehab consult     Precautions / Restrictions Precautions Precautions:  Fall Required Braces or Orthoses: Splint/Cast Restrictions Weight Bearing Restrictions: Yes LUE Weight Bearing: Weight bear through elbow only RLE Weight Bearing: Weight bearing as tolerated LLE Weight Bearing: Weight bearing as tolerated Other Position/Activity Restrictions: no weightbearing on L hand. PT has issued platform walker.     Mobility  Bed Mobility Overal bed mobility: Needs Assistance Bed Mobility: Supine to Sit     Supine to sit: Min assist     General bed mobility comments: OOB in recliner    Transfers Overall transfer level: Needs assistance Equipment used: Left platform walker Transfers: Sit to/from Stand Sit to Stand: Min assist, Min guard           General transfer comment: initial min a to rise from recliner with cues for hand placement d/t WB restrictions, down to min guard with practice x3    Ambulation/Gait Ambulation/Gait assistance: Min guard Gait Distance (Feet): 120 Feet (x2) Assistive device: Left platform walker Gait Pattern/deviations: Knee flexed in stance - right, Knee flexed in stance - left, Antalgic, Step-through pattern Gait velocity: decr     General Gait Details: light cues at start for RW proximity,  pt quick to fatigue needing seated rest break, min guard for safety especially when turning   Stairs             Wheelchair Mobility    Modified Rankin (Stroke Patients Only)       Balance Overall balance assessment: Needs assistance Sitting-balance support: Feet supported Sitting balance-Leahy Scale: Good     Standing balance support: Bilateral upper extremity supported Standing balance-Leahy Scale: Poor Standing balance comment: reliant on BUE support                            Cognition Arousal/Alertness: Awake/alert Behavior During  Therapy: WFL for tasks assessed/performed Overall Cognitive Status: Within Functional Limits for tasks assessed                                           Exercises General Exercises - Lower Extremity Hip Flexion/Marching: Both, Standing, 20 reps Heel Raises: 10 reps, Standing Mini-Sqauts: 10 reps, Standing    General Comments General comments (skin integrity, edema, etc.): spouse and daughter present throughout      Pertinent Vitals/Pain Pain Assessment Pain Assessment: No/denies pain    Home Living                          Prior Function            PT Goals (current goals can now be found in the care plan section) Acute Rehab PT Goals Patient Stated Goal: to get out of bed PT Goal Formulation: With patient/family Time For Goal Achievement: 11/06/21    Frequency    Min 5X/week      PT Plan      Co-evaluation              AM-PAC PT "6 Clicks" Mobility   Outcome Measure  Help needed turning from your back to your side while in a flat bed without using bedrails?: A Lot Help needed moving from lying on your back to sitting on the side of a flat bed without using bedrails?: A Lot Help needed moving to and from a bed to a chair (including a wheelchair)?: A Little Help needed standing up from a chair using your arms (e.g., wheelchair or bedside chair)?: A Little Help needed to walk in hospital room?: A Little Help needed climbing 3-5 steps with a railing? : A Lot 6 Click Score: 15    End of Session Equipment Utilized During Treatment: Gait belt Activity Tolerance: Patient tolerated treatment well Patient left: in chair;with call bell/phone within reach;with family/visitor present Nurse Communication: Mobility status PT Visit Diagnosis: Pain;Difficulty in walking, not elsewhere classified (R26.2) Pain - part of body:  (pelvis)     Time: 6811-5726 PT Time Calculation (min) (ACUTE ONLY): 20 min  Charges:  $Therapeutic Exercise: 8-22 mins                     Kees Idrovo R. PTA Acute Rehabilitation Services Office: [place] 11/01/2021, 12:01 PM

## 2021-11-01 NOTE — Consult Note (Addendum)
[place] Nurse Consult Note: Reason for Consult: Consult requested for sacrum.  Bedside nurse reports a wound that was not present on admission.  Pt has a pinhole opening over the sacrum; approx .2X.2x.2cm, red, no drainage.  Pt states he previously had a "red raised area" to this location.  Appearance is consistent with a possible cyst which ruptured and drained spontaneously.  Dressing procedure/placement/frequency: Topical treatment orders provided for bedside nurses to perform as follows to protect from further injury: Foam dressing to sacrum, change Q 3 days or PRN soiling. Please re-consult if further assistance is needed.  Thank-you,  Julien Girt MSN, [place], [place], [place], [place]

## 2021-11-04 ENCOUNTER — Telehealth: Payer: Self-pay | Admitting: Family Medicine

## 2021-11-04 LAB — BASIC METABOLIC PANEL
BUN: 24 — AB (ref 4–21)
CO2: 20 (ref 13–22)
Chloride: 98 — AB (ref 99–108)
Creatinine: 2.4 — AB (ref 0.6–1.3)
Glucose: 84
Potassium: 4.7 mEq/L (ref 3.5–5.1)
Sodium: 128 — AB (ref 137–147)

## 2021-11-04 LAB — COMPREHENSIVE METABOLIC PANEL
Calcium: 8.2 — AB (ref 8.7–10.7)
eGFR: 25

## 2021-11-04 LAB — CBC: RBC: 3.07 — AB (ref 3.87–5.11)

## 2021-11-04 LAB — CBC AND DIFFERENTIAL
HCT: 26 — AB (ref 41–53)
Hemoglobin: 8.7 — AB (ref 13.5–17.5)
Platelets: 396 10*3/uL (ref 150–400)
WBC: 9.7

## 2021-11-04 NOTE — Telephone Encounter (Signed)
Please get update on patient after trauma/admission.  My understanding is that he was d/c'd to SNF.  Thanks.

## 2021-11-05 ENCOUNTER — Encounter: Payer: Self-pay | Admitting: Adult Health

## 2021-11-05 ENCOUNTER — Non-Acute Institutional Stay (SKILLED_NURSING_FACILITY): Payer: Medicare PPO | Admitting: Adult Health

## 2021-11-05 DIAGNOSIS — N184 Chronic kidney disease, stage 4 (severe): Secondary | ICD-10-CM | POA: Diagnosis not present

## 2021-11-05 DIAGNOSIS — S62307G Unspecified fracture of fifth metacarpal bone, left hand, subsequent encounter for fracture with delayed healing: Secondary | ICD-10-CM

## 2021-11-05 DIAGNOSIS — K219 Gastro-esophageal reflux disease without esophagitis: Secondary | ICD-10-CM

## 2021-11-05 DIAGNOSIS — E039 Hypothyroidism, unspecified: Secondary | ICD-10-CM | POA: Diagnosis not present

## 2021-11-05 DIAGNOSIS — I10 Essential (primary) hypertension: Secondary | ICD-10-CM

## 2021-11-05 DIAGNOSIS — S3282XS Multiple fractures of pelvis without disruption of pelvic ring, sequela: Secondary | ICD-10-CM

## 2021-11-05 DIAGNOSIS — K5901 Slow transit constipation: Secondary | ICD-10-CM | POA: Diagnosis not present

## 2021-11-05 DIAGNOSIS — S3282XG Multiple fractures of pelvis without disruption of pelvic ring, subsequent encounter for fracture with delayed healing: Secondary | ICD-10-CM

## 2021-11-05 DIAGNOSIS — S62307S Unspecified fracture of fifth metacarpal bone, left hand, sequela: Secondary | ICD-10-CM
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
## 2021-11-06 ENCOUNTER — Encounter: Payer: Self-pay | Admitting: Internal Medicine

## 2021-11-06 ENCOUNTER — Non-Acute Institutional Stay (SKILLED_NURSING_FACILITY): Payer: Medicare PPO | Admitting: Internal Medicine

## 2021-11-06 DIAGNOSIS — S62309A Unspecified fracture of unspecified metacarpal bone, initial encounter for closed fracture: Secondary | ICD-10-CM | POA: Insufficient documentation

## 2021-11-06 DIAGNOSIS — S3282XG Multiple fractures of pelvis without disruption of pelvic ring, subsequent encounter for fracture with delayed healing: Secondary | ICD-10-CM | POA: Diagnosis not present

## 2021-11-06 DIAGNOSIS — D631 Anemia in chronic kidney disease: Secondary | ICD-10-CM

## 2021-11-06 DIAGNOSIS — E871 Hypo-osmolality and hyponatremia: Secondary | ICD-10-CM

## 2021-11-06 DIAGNOSIS — S62307G Unspecified fracture of fifth metacarpal bone, left hand, subsequent encounter for fracture with delayed healing: Secondary | ICD-10-CM

## 2021-11-06 DIAGNOSIS — N179 Acute kidney failure, unspecified: Secondary | ICD-10-CM | POA: Diagnosis not present

## 2021-11-06 DIAGNOSIS — N184 Chronic kidney disease, stage 4 (severe): Secondary | ICD-10-CM | POA: Diagnosis not present

## 2021-11-06 NOTE — Assessment & Plan Note (Addendum)
5/16-5/26/23 H/H 9.2/28 @ admission, 8.7/26 @ discharge.  No bleeding dyscrasias reported at the SNF. Stool is dark in the context of iron supplementation.

## 2021-11-06 NOTE — Assessment & Plan Note (Addendum)
5/16-5/26/23 peak creatinine 2.84, nadir GFR 20. Pre discharge creatinine  2.40, GFR  25, CKD Stage IV. He is on high-dose Losartan; according to Up to Date no dosage adjustment required.
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
## 2021-11-06 NOTE — Assessment & Plan Note (Signed)
Repeat BMET based on clinical signs and symptoms, especially any change in mental status.

## 2021-11-06 NOTE — Patient Instructions (Signed)
See assessment and plan under each diagnosis in the problem list and acutely for this visit 

## 2021-11-06 NOTE — Assessment & Plan Note (Signed)
PT/OT at SNF as tolerated. ?

## 2021-11-06 NOTE — Telephone Encounter (Signed)
lmtcb

## 2021-11-06 NOTE — Assessment & Plan Note (Signed)
Residual stiffness and soreness but no significant persistent pain in the left wrist.

## 2021-11-12 ENCOUNTER — Other Ambulatory Visit: Payer: Medicare PPO

## 2021-11-12 NOTE — Telephone Encounter (Signed)
[place] Night - Client Nonclinical Telephone Record  [facility] Client [facility] Night - Client Client Site [facility] [place] - Night Provider Renford Dills - MD Contact Type Call Who Is Calling Patient / Member / Family / Caregiver Caller Name refused Caller Phone Number [phone] Call Type Message Only Information Provided Reason for Call Returning a Call from the Office Initial Comment Caller states they called saying Syed Zukas has an appt but hes in rehab. Additional Comment He can't come today. Disp. Time Disposition Final User 11/12/2021 8:01:07 AM General Information Provided Yes Mable Paris Call Closed By: Mable Paris Transaction Date/Time: 11/12/2021 7:59:22 AM (ET   Lab appt for 11/12/21 already cancelled. Sending note to [facility].
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
## 2021-11-12 NOTE — Telephone Encounter (Signed)
Patient still in rehab facility

## 2021-11-13 DIAGNOSIS — S3282XA Multiple fractures of pelvis without disruption of pelvic ring, initial encounter for closed fracture: Secondary | ICD-10-CM | POA: Diagnosis not present

## 2021-11-13 DIAGNOSIS — S62307A Unspecified fracture of fifth metacarpal bone, left hand, initial encounter for closed fracture: Secondary | ICD-10-CM | POA: Diagnosis not present

## 2021-11-14 DIAGNOSIS — I5033 Acute on chronic diastolic (congestive) heart failure: Secondary | ICD-10-CM | POA: Diagnosis not present

## 2021-11-14 DIAGNOSIS — S3282XS Multiple fractures of pelvis without disruption of pelvic ring, sequela: Secondary | ICD-10-CM | POA: Diagnosis not present

## 2021-11-14 DIAGNOSIS — D131 Benign neoplasm of stomach: Secondary | ICD-10-CM | POA: Diagnosis not present

## 2021-11-14 DIAGNOSIS — I10 Essential (primary) hypertension: Secondary | ICD-10-CM | POA: Diagnosis not present

## 2021-11-15 ENCOUNTER — Other Ambulatory Visit: Payer: Self-pay | Admitting: Family Medicine

## 2021-11-21 ENCOUNTER — Encounter: Payer: Self-pay | Admitting: Family Medicine

## 2021-11-21 ENCOUNTER — Ambulatory Visit: Payer: Medicare PPO | Admitting: Family Medicine

## 2021-11-21 VITALS — BP 130/82 | HR 72 | Temp 98.1°F | Ht 68.0 in | Wt 142.0 lb

## 2021-11-21 DIAGNOSIS — N184 Chronic kidney disease, stage 4 (severe): Secondary | ICD-10-CM | POA: Diagnosis not present

## 2021-11-21 DIAGNOSIS — S329XXD Fracture of unspecified parts of lumbosacral spine and pelvis, subsequent encounter for fracture with routine healing: Secondary | ICD-10-CM

## 2021-11-21 DIAGNOSIS — D509 Iron deficiency anemia, unspecified: Secondary | ICD-10-CM

## 2021-11-21 DIAGNOSIS — S3282XG Multiple fractures of pelvis without disruption of pelvic ring, subsequent encounter for fracture with delayed healing: Secondary | ICD-10-CM | POA: Diagnosis not present

## 2021-11-21 DIAGNOSIS — D631 Anemia in chronic kidney disease: Secondary | ICD-10-CM

## 2021-11-21 LAB — CBC WITH DIFFERENTIAL/PLATELET
Basophils Absolute: 0.1 10*3/uL (ref 0.0–0.1)
Basophils Relative: 0.7 % (ref 0.0–3.0)
Eosinophils Absolute: 0.2 10*3/uL (ref 0.0–0.7)
Eosinophils Relative: 2.9 % (ref 0.0–5.0)
HCT: 29.2 % — ABNORMAL LOW (ref 39.0–52.0)
Hemoglobin: 9.7 g/dL — ABNORMAL LOW (ref 13.0–17.0)
Lymphocytes Relative: 7.8 % — ABNORMAL LOW (ref 12.0–46.0)
Lymphs Abs: 0.5 10*3/uL — ABNORMAL LOW (ref 0.7–4.0)
MCHC: 33.2 g/dL (ref 30.0–36.0)
MCV: 83.5 fl (ref 78.0–100.0)
Monocytes Absolute: 0.5 10*3/uL (ref 0.1–1.0)
Monocytes Relative: 7.3 % (ref 3.0–12.0)
Neutro Abs: 5.6 10*3/uL (ref 1.4–7.7)
Neutrophils Relative %: 81.8 % — ABNORMAL HIGH (ref 43.0–77.0)
Platelets: 404 10*3/uL — ABNORMAL HIGH (ref 150.0–400.0)
RBC: 3.5 Mil/uL — ABNORMAL LOW (ref 4.22–5.81)
RDW: 17.7 % — ABNORMAL HIGH (ref 11.5–15.5)
WBC: 6.9 10*3/uL (ref 4.0–10.5)

## 2021-11-21 LAB — BASIC METABOLIC PANEL
BUN: 23 mg/dL (ref 6–23)
CO2: 27 mEq/L (ref 19–32)
Calcium: 8.9 mg/dL (ref 8.4–10.5)
Chloride: 96 mEq/L (ref 96–112)
Creatinine, Ser: 2.53 mg/dL — ABNORMAL HIGH (ref 0.40–1.50)
GFR: 21.72 mL/min — ABNORMAL LOW (ref >60.00)
Glucose, Bld: 77 mg/dL (ref 70–99)
Potassium: 4.2 mEq/L (ref 3.5–5.1)
Sodium: 130 mEq/L — ABNORMAL LOW (ref 135–145)

## 2021-11-21 LAB — IRON: Iron: 32 ug/dL — ABNORMAL LOW (ref 42–165)

## 2021-11-21 MED ORDER — FERROUS SULFATE 325 (65 FE) MG PO TABS: 325.0000 mg | ORAL_TABLET | Freq: Every day | ORAL | Status: DC

## 2021-11-21 NOTE — Progress Notes (Unsigned)
Not taking MOM.  Is taking iron QD, not BID.    Still using a walker most of the time at home.    Inpatient for ~10 days then similar at rehab.  Needs PT set up, ordered.    CKD d/w pt.  Recheck labs pending.    No pain at time of OV.  He has pain trying to get up or turning over in the bed, and those are getting better.  No pain meds except for tylenol prn.  Appetite is still "pretty good."    He can get up two steps to get in the house.   Meds, vitals, and allergies reviewed.   ROS: Per HPI unless specifically indicated in ROS section .     Trace BLE edema.

## 2021-11-21 NOTE — Patient Instructions (Addendum)
Tylenol if needed for pain.   Go to the lab on the way out.   If you have mychart we'll likely use that to update you.    Take care.  Glad to see you. We'll work on getting PT set up.

## 2021-11-24 NOTE — Assessment & Plan Note (Signed)
He is managing his pain with Tylenol.  Would continue as is.  We will set up PT.  See notes on labs.

## 2021-11-24 NOTE — Assessment & Plan Note (Signed)
See notes on follow-up labs.  Continue iron.

## 2021-11-26 ENCOUNTER — Telehealth: Payer: Self-pay | Admitting: Family Medicine

## 2021-11-26 NOTE — Telephone Encounter (Addendum)
[facility] & Treat - Pain Managment ; dx education ; medication management per Dr. [name].

## 2021-11-26 NOTE — Telephone Encounter (Signed)
Thanks

## 2021-11-26 NOTE — Telephone Encounter (Addendum)
I spoke with [facility] and they need RN or CMA to call and give verbal orders to the [place]   Reuel Boom, RN [phone]  Once they receive the VO for SN they can contact patient to set up [facility] for [facility] -- they are shooting for Thursday or Friday this week if they get the orders soon.

## 2021-11-26 NOTE — Telephone Encounter (Signed)
Please give verbal order for [facility].  Add on skilled nursing: eval & Treat - Pain Managment ; dx education ; medication management.  Thanks.

## 2021-11-26 NOTE — Telephone Encounter (Signed)
What [place] agency needs to be contacted.

## 2021-11-27 ENCOUNTER — Ambulatory Visit (INDEPENDENT_AMBULATORY_CARE_PROVIDER_SITE_OTHER): Payer: Medicare PPO

## 2021-11-27 VITALS — Ht 68.0 in | Wt 142.0 lb

## 2021-11-27 DIAGNOSIS — Z0000 Encounter for general adult medical examination without abnormal findings: Secondary | ICD-10-CM

## 2021-11-27 NOTE — Progress Notes (Signed)
Subjective:   Rick [name] is a 88 y.o. male who presents for Medicare Annual/Subsequent preventive examination.  Review of Systems    Virtual Visit via Telephone Note  I connected with  Rick [name] on 11/27/21 at  9:45 AM EDT by telephone and verified that I am speaking with the correct person using two identifiers.  Location: Patient: Home Provider: Office Persons participating in the virtual visit: patient/Nurse Health Advisor   I discussed the limitations, risks, security and privacy concerns of performing an evaluation and management service by telephone and the availability of in person appointments. The patient expressed understanding and agreed to proceed.  Interactive audio and video telecommunications were attempted between this nurse and patient, however failed, due to patient having technical difficulties OR patient did not have access to video capability.  We continued and completed visit with audio only.  Some vital signs may be absent or patient reported.   Criselda Peaches, LPN  Cardiac Risk Factors include: advanced age (>66mn, >>36women);hypertension;male gender     Objective:    Today's Vitals   11/27/21 0945  Weight: 142 lb (64.4 kg)  Height: '5\' 8"'$  (1.727 m)   Body mass index is 21.59 kg/m.     11/27/2021   10:01 AM 11/05/2021   10:37 AM 10/22/2021    8:00 PM 05/02/2021   12:56 PM 05/02/2021   12:00 PM 11/30/2020    9:42 AM 11/11/2018   10:09 AM  Advanced Directives  Does Patient Have a Medical Advance Directive? Yes No No  No No No  Type of Advance Directive Living will;Healthcare Power of Attorney        Does patient want to make changes to medical advance directive? No - Patient declined        [place] of [place]? No - copy requested        Would patient like information on creating a medical advance directive?  No - Patient declined No - Patient declined No - Patient declined  No - Patient declined No - Patient  declined    Current Medications (verified) Outpatient Encounter Medications as of 11/27/2021  Medication Sig   acetaminophen (TYLENOL) 325 MG tablet Take 2 tablets (650 mg total) by mouth every 6 (six) hours as needed.   amLODipine (NORVASC) 10 MG tablet Take 10 mg by mouth daily.   feeding supplement (ENSURE ENLIVE / ENSURE PLUS) LIQD Take 237 mLs by mouth 2 (two) times daily between meals.   ferrous sulfate 325 (65 FE) MG tablet Take 1 tablet (325 mg total) by mouth daily with breakfast.   latanoprost (XALATAN) 0.005 % ophthalmic solution Place 1 drop into both eyes at bedtime.   levothyroxine (SYNTHROID) 25 MCG tablet Take 1 tablet (25 mcg total) by mouth daily. Take on empty stomach early in the morning.   losartan (COZAAR) 100 MG tablet Take 100 mg by mouth daily.   omeprazole (PRILOSEC OTC) 20 MG tablet Take 1 tablet (20 mg total) by mouth daily.   sodium bicarbonate 650 MG tablet Take 2 tablets (1,300 mg total) by mouth 2 (two) times daily.   No facility-administered encounter medications on file as of 11/27/2021.    Allergies (verified) Lisinopril and Nsaids   History: Past Medical History:  Diagnosis Date   Arthritis    GERD (gastroesophageal reflux disease)    History of colon polyps    History of prostate cancer    Hyperlipidemia    Hypertension  Kidney cysts    bilateral.  Renal US   Mitral regurgitation 12/02/2020   Past Surgical History:  Procedure Laterality Date   BIOPSY  05/05/2021   Procedure: BIOPSY;  Surgeon: Thornton Park, MD;  Location: [facility];  Service: Gastroenterology;;   COLONOSCOPY     CYSTOSCOPY  02/12/99   biopsy   ESOPHAGEAL BANDING N/A 08/04/2012   Procedure: ESOPHAGEAL BANDING;  Surgeon: Inda Castle, MD;  Location: [facility];  Service: Endoscopy;  Laterality: N/A;   ESOPHAGOGASTRODUODENOSCOPY N/A 08/04/2012   Procedure: ESOPHAGOGASTRODUODENOSCOPY (EGD);  Surgeon: Inda Castle, MD;  Location: [facility];  Service: Endoscopy;   Laterality: N/A;   ESOPHAGOGASTRODUODENOSCOPY (EGD) WITH PROPOFOL N/A 05/05/2021   Procedure: ESOPHAGOGASTRODUODENOSCOPY (EGD) WITH PROPOFOL;  Surgeon: Thornton Park, MD;  Location: [facility];  Service: Gastroenterology;  Laterality: N/A;   INGUINAL HERNIA REPAIR  02/1999   Dr. Reece Agar   POLYPECTOMY  05/05/2021   Procedure: POLYPECTOMY;  Surgeon: Thornton Park, MD;  Location: [facility];  Service: Gastroenterology;;   PROSTATECTOMY  02/1999   Family History  Problem Relation Age of Onset   Bone cancer Sister    Stomach cancer Brother    Stomach cancer Brother    Lung cancer Sister    Colon cancer Sister    Stroke Mother    Colon cancer Other        nephew   Social History   Socioeconomic History   Marital status: Married    Spouse name: Not on file   Number of children: 6   Years of education: Not on file   Highest education level: Not on file  Occupational History   Occupation: retired- Patent attorney, Biochemist, clinical    Comment: at [employer], [employer] Therapist, art: RETIRED  Tobacco Use   Smoking status: Former    Types: Cigarettes    Quit date: 08/26/2001    Years since quitting: 20.2   Smokeless tobacco: Former    Types: Chew   Tobacco comments:    quit about 10 years or more  Vaping Use   Vaping Use: Never used  Substance and Sexual Activity   Alcohol use: Yes   Drug use: No   Sexual activity: Not Currently  Other Topics Concern   Not on file  Social History Narrative   Retired Psychologist, sport and exercise, worked at [employer], [place]   Married 1955   6 kids initially, oldest daughter died after surgery   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, 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   Social Determinants of Health   Financial Resource Strain: [place]  (11/27/2021)   Overall Financial Resource Strain (CARDIA)    Difficulty of Paying Living Expenses: Not hard at all  Food Insecurity: No Food Insecurity (11/27/2021)   Hunger Vital Sign    Worried About Running Out of Food in the Last  Year: Never true    [place] in the Last Year: Never true  Transportation Needs: No Transportation Needs (11/27/2021)   PRAPARE - Hydrologist (Medical): No    Lack of Transportation (Non-Medical): No  Physical Activity: Insufficiently Active (11/27/2021)   Exercise Vital Sign    Days of Exercise per Week: 7 days    Minutes of Exercise per Session: 10 min  Stress: No Stress Concern Present (11/27/2021)   [place]    Feeling of Stress : Not at all  Social Connections: [place] (11/27/2021)   Social Connection and  Isolation Panel [NHANES]    Frequency of Communication with Friends and Family: More than three times a week    Frequency of Social Gatherings with Friends and Family: More than three times a week    Attends Religious Services: More than 4 times per year    Active Member of [employer] or Organizations: Yes    Attends Music therapist: More than 4 times per year    Marital Status: Married    Tobacco Counseling Counseling given: Not Answered Tobacco comments: quit about 10 years or more   Clinical Intake:  Diabetic?  No  Activities of Daily Living    11/27/2021    9:56 AM 10/22/2021    8:00 PM  In your present state of health, do you have any difficulty performing the following activities:  Hearing? 0 0  Vision? 0 0  Difficulty concentrating or making decisions? 0 0  Walking or climbing stairs? 0 0  Dressing or bathing? 0 0  Doing errands, shopping? 0 0  Preparing Food and eating ? N   Using the Toilet? N   In the past six months, have you accidently leaked urine? N   Do you have problems with loss of bowel control? N   Managing your Medications? N   Managing your Finances? N   Housekeeping or managing your Housekeeping? N     Patient Care Team: Tonia Ghent, MD as PCP - General (Family Medicine) Ardis Hughs, MD as Attending  Physician (Urology) Clent Jacks, MD as Consulting Physician (Ophthalmology) Gean Quint, MD as Consulting Physician (Nephrology)  Indicate any recent Medical Services you may have received from other than [facility] providers in the past year (date may be approximate).     Assessment:   This is a routine wellness examination for Rick [name].  Hearing/Vision screen Hearing Screening - Comments:: No hearing difficulty Vision Screening - Comments:: Wears glasses. Followed by Dr Schuyler Amor  Dietary issues and exercise activities discussed: Exercise limited by: None identified   Goals Addressed               This Visit's Progress     Patient stated (pt-stated)        I would to get  out and walk more.       Depression Screen    11/27/2021    9:53 AM 05/28/2020   10:40 AM 11/11/2018   10:08 AM 10/23/2017    8:21 AM 10/22/2016    3:06 PM 10/15/2016    2:21 PM 01/12/2015    9:04 AM  PHQ 2/9 Scores  PHQ - 2 Score 0 0 0 0 0 0 0  PHQ- 9 Score   0 0       Fall Risk    11/27/2021    9:57 AM 05/28/2020   10:40 AM 11/11/2018   10:08 AM 10/23/2017    8:21 AM 10/22/2016    3:06 PM  Fall Risk   Falls in the past year? 1 0 1 No No  Comment   falls due to loss of balance    Number falls in past yr: 0 0 1    Injury with Fall? 1 0 0    Comment Fx pelvis. Followed by PCP      Risk for fall due to :   History of fall(s);Impaired balance/gait    Follow up Falls prevention discussed Falls evaluation completed       FALL RISK PREVENTION PERTAINING TO THE HOME:  Any stairs in or  around the home? Yes  If so, are there any without handrails? No  Home free of loose throw rugs in walkways, pet beds, electrical cords, etc? Yes  Adequate lighting in your home to reduce risk of falls? Yes   ASSISTIVE DEVICES UTILIZED TO PREVENT FALLS:  Life alert? No  Use of a cane, walker or w/c? Yes  Grab bars in the bathroom? No  Shower chair or bench in shower? No  Elevated toilet seat or a handicapped toilet?  Yes  TIMED UP AND GO:  Was the test performed? No . Audio Visit  Cognitive Function:    11/11/2018   10:10 AM 10/23/2017    8:21 AM 10/15/2016    2:15 PM  MMSE - Mini Mental State Exam  Orientation to time '5 5 5  '$ Orientation to Place '5 5 5  '$ Registration '3 3 3  '$ Attention/ Calculation 0 0 0  Recall '3 3 1  '$ Recall-comments   pt was unable to recall 2 of 3 words  Language- name 2 objects 0 0 0  Language- repeat '1 1 1  '$ Language- follow 3 step command 0 3 3  Language- read & follow direction 0 0 0  Write a sentence 0 0 0  Copy design 0 0 0  Total score '17 20 18        '$ 11/27/2021   10:01 AM  6CIT Screen  What Year? 0 points  What month? 0 points  What time? 0 points  Count back from 20 2 points  Months in reverse 0 points  Repeat phrase 2 points  Total Score 4 points    Immunizations Immunization History  Administered Date(s) Administered   Influenza Split 04/23/2011   Influenza Whole 06/18/2005, 03/03/2008   Influenza, High Dose Seasonal PF 04/06/2017, 03/15/2019   Influenza,inj,Quad PF,6+ Mos 04/19/2013   Influenza-Unspecified 02/21/2016, 04/28/2020   Moderna Sars-Covid-2 Vaccination 07/14/2019, 08/16/2019, 04/11/2020   Pneumococcal Conjugate-13 01/12/2015   Pneumococcal Polysaccharide-23 06/09/2000   Td 06/09/1994, 03/09/2008   Tdap 02/01/2018   Tetanus 06/09/2018    TDAP status: Up to date  Flu Vaccine status: Declined, Education has been provided regarding the importance of this vaccine but patient still declined. Advised may receive this vaccine at local pharmacy or Health Dept. Aware to provide a copy of the vaccination record if obtained from local pharmacy or Health Dept. Verbalized acceptance and understanding.  Pneumococcal vaccine status: Up to date  Covid-19 vaccine status: Completed vaccines  Qualifies for Shingles Vaccine? Yes   Zostavax completed No   Shingrix Completed?: No.    Education has been provided regarding the importance of this vaccine.  Patient has been advised to call insurance company to determine out of pocket expense if they have not yet received this vaccine. Advised may also receive vaccine at local pharmacy or Health Dept. Verbalized acceptance and understanding.  Screening Tests Health Maintenance  Topic Date Due   COVID-19 Vaccine (4 - Booster for Moderna series) 12/13/2021 (Originally 06/06/2020)   Zoster Vaccines- Shingrix (1 of 2) 02/27/2022 (Originally 03/13/1948)   INFLUENZA VACCINE  01/07/2022   TETANUS/TDAP  06/09/2028   Pneumonia Vaccine 72+ Years old  Completed   HPV VACCINES  Aged Out    Health Maintenance  There are no preventive care reminders to display for this patient.   Colorectal cancer screening: No longer required.   Lung Cancer Screening: (Low Dose CT Chest recommended if Age 49-80 years, 30 pack-year currently smoking OR have quit w/in 15years.) does not qualify.  Additional Screening:  Hepatitis C Screening: does not qualify; Completed   Vision Screening: Recommended annual ophthalmology exams for early detection of glaucoma and other disorders of the eye. Is the patient up to date with their annual eye exam?  Yes  Who is the provider or what is the name of the office in which the patient attends annual eye exams? Dr Schuyler Amor If pt is not established with a provider, would they like to be referred to a provider to establish care? No .   Dental Screening: Recommended annual dental exams for proper oral hygiene  Community Resource Referral / Chronic Care Management:  CRR required this visit?  No   CCM required this visit?  No      Plan:     I have personally reviewed and noted the following in the patient's chart:   Medical and social history Use of alcohol, tobacco or illicit drugs  Current medications and supplements including opioid prescriptions. Patient is not currently taking opioid prescriptions. Functional ability and status Nutritional status Physical  activity Advanced directives List of other physicians Hospitalizations, surgeries, and ER visits in previous 12 months Vitals Screenings to include cognitive, depression, and falls Referrals and appointments  In addition, I have reviewed and discussed with patient certain preventive protocols, quality metrics, and best practice recommendations. A written personalized care plan for preventive services as well as general preventive health recommendations were provided to patient.     Criselda Peaches, LPN   [id number]   Nurse Notes: None

## 2021-11-27 NOTE — Patient Instructions (Addendum)
Rick [name] , Thank you for taking time to come for your Medicare Wellness Visit. I appreciate your ongoing commitment to your health goals. Please review the following plan we discussed and let me know if I can assist you in the future.   These are the goals we discussed:  Goals       Patient Stated      Starting 11/11/2018, I will continue to take medications as prescribed.       Patient stated (pt-stated)      I would to get  out and walk more.        This is a list of the screening recommended for you and due dates:  Health Maintenance  Topic Date Due   COVID-19 Vaccine (4 - Booster for Moderna series) 12/13/2021*   Zoster (Shingles) Vaccine (1 of 2) 02/27/2022*   Flu Shot  01/07/2022   Tetanus Vaccine  06/09/2028   Pneumonia Vaccine  Completed   HPV Vaccine  Aged Out  *Topic was postponed. The date shown is not the original due date.   Advanced directives: Yes  Conditions/risks identified: None  Next appointment: Follow up in one year for your annual wellness visit.    Preventive Care 90 Years and Older, Male Preventive care refers to lifestyle choices and visits with your health care provider that can promote health and wellness. What does preventive care include? A yearly physical exam. This is also called an annual well check. Dental exams once or twice a year. Routine eye exams. Ask your health care provider how often you should have your eyes checked. Personal lifestyle choices, including: Daily care of your teeth and gums. Regular physical activity. Eating a healthy diet. Avoiding tobacco and drug use. Limiting alcohol use. Practicing safe sex. Taking low doses of aspirin every day. Taking vitamin and mineral supplements as recommended by your health care provider. What happens during an annual well check? The services and screenings done by your health care provider during your annual well check will depend on your age, overall health, lifestyle risk  factors, and family history of disease. Counseling  Your health care provider may ask you questions about your: Alcohol use. Tobacco use. Drug use. Emotional well-being. Home and relationship well-being. Sexual activity. Eating habits. History of falls. Memory and ability to understand (cognition). Work and work Statistician. Screening  You may have the following tests or measurements: Height, weight, and BMI. Blood pressure. Lipid and cholesterol levels. These may be checked every 5 years, or more frequently if you are over 68 years old. Skin check. Lung cancer screening. You may have this screening every year starting at age 53 if you have a 30-pack-year history of smoking and currently smoke or have quit within the past 15 years. Fecal occult blood test (FOBT) of the stool. You may have this test every year starting at age 61. Flexible sigmoidoscopy or colonoscopy. You may have a sigmoidoscopy every 5 years or a colonoscopy every 10 years starting at age 68. Prostate cancer screening. Recommendations will vary depending on your family history and other risks. Hepatitis C blood test. Hepatitis B blood test. Sexually transmitted disease (STD) testing. Diabetes screening. This is done by checking your blood sugar (glucose) after you have not eaten for a while (fasting). You may have this done every 1-3 years. Abdominal aortic aneurysm (AAA) screening. You may need this if you are a current or former smoker. Osteoporosis. You may be screened starting at age 61 if you are at  high risk. Talk with your health care provider about your test results, treatment options, and if necessary, the need for more tests. Vaccines  Your health care provider may recommend certain vaccines, such as: Influenza vaccine. This is recommended every year. Tetanus, diphtheria, and acellular pertussis (Tdap, Td) vaccine. You may need a Td booster every 10 years. Zoster vaccine. You may need this after age  51. Pneumococcal 13-valent conjugate (PCV13) vaccine. One dose is recommended after age 45. Pneumococcal polysaccharide (PPSV23) vaccine. One dose is recommended after age 62. Talk to your health care provider about which screenings and vaccines you need and how often you need them. This information is not intended to replace advice given to you by your health care provider. Make sure you discuss any questions you have with your health care provider. Document Released: 06/22/2015 Document Revised: 02/13/2016 Document Reviewed: 03/27/2015 Elsevier Interactive Patient Education  2017 [place] Prevention in the Home Falls can cause injuries. They can happen to people of all ages. There are many things you can do to make your home safe and to help prevent falls. What can I do on the outside of my home? Regularly fix the edges of walkways and driveways and fix any cracks. Remove anything that might make you trip as you walk through a door, such as a raised step or threshold. Trim any bushes or trees on the path to your home. Use bright outdoor lighting. Clear any walking paths of anything that might make someone trip, such as rocks or tools. Regularly check to see if handrails are loose or broken. Make sure that both sides of any steps have handrails. Any raised decks and porches should have guardrails on the edges. Have any leaves, snow, or ice cleared regularly. Use sand or salt on walking paths during winter. Clean up any spills in your garage right away. This includes oil or grease spills. What can I do in the bathroom? Use night lights. Install grab bars by the toilet and in the tub and shower. Do not use towel bars as grab bars. Use non-skid mats or decals in the tub or shower. If you need to sit down in the shower, use a plastic, non-slip stool. Keep the floor dry. Clean up any water that spills on the floor as soon as it happens. Remove soap buildup in the tub or shower  regularly. Attach bath mats securely with double-sided non-slip rug tape. Do not have throw rugs and other things on the floor that can make you trip. What can I do in the bedroom? Use night lights. Make sure that you have a light by your bed that is easy to reach. Do not use any sheets or blankets that are too big for your bed. They should not hang down onto the floor. Have a firm chair that has side arms. You can use this for support while you get dressed. Do not have throw rugs and other things on the floor that can make you trip. What can I do in the kitchen? Clean up any spills right away. Avoid walking on wet floors. Keep items that you use a lot in easy-to-reach places. If you need to reach something above you, use a strong step stool that has a grab bar. Keep electrical cords out of the way. Do not use floor polish or wax that makes floors slippery. If you must use wax, use non-skid floor wax. Do not have throw rugs and other things on the floor that  can make you trip. What can I do with my stairs? Do not leave any items on the stairs. Make sure that there are handrails on both sides of the stairs and use them. Fix handrails that are broken or loose. Make sure that handrails are as long as the stairways. Check any carpeting to make sure that it is firmly attached to the stairs. Fix any carpet that is loose or worn. Avoid having throw rugs at the top or bottom of the stairs. If you do have throw rugs, attach them to the floor with carpet tape. Make sure that you have a light switch at the top of the stairs and the bottom of the stairs. If you do not have them, ask someone to add them for you. What else can I do to help prevent falls? Wear shoes that: Do not have high heels. Have rubber bottoms. Are comfortable and fit you well. Are closed at the toe. Do not wear sandals. If you use a stepladder: Make sure that it is fully opened. Do not climb a closed stepladder. Make sure that  both sides of the stepladder are locked into place. Ask someone to hold it for you, if possible. Clearly mark and make sure that you can see: Any grab bars or handrails. First and last steps. Where the edge of each step is. Use tools that help you move around (mobility aids) if they are needed. These include: Canes. Walkers. Scooters. Crutches. Turn on the lights when you go into a dark area. Replace any light bulbs as soon as they burn out. Set up your furniture so you have a clear path. Avoid moving your furniture around. If any of your floors are uneven, fix them. If there are any pets around you, be aware of where they are. Review your medicines with your doctor. Some medicines can make you feel dizzy. This can increase your chance of falling. Ask your doctor what other things that you can do to help prevent falls. This information is not intended to replace advice given to you by your health care provider. Make sure you discuss any questions you have with your health care provider. Document Released: 03/22/2009 Document Revised: 11/01/2015 Document Reviewed: 06/30/2014 Elsevier Interactive Patient Education  2017 [employer].

## 2021-12-11 DIAGNOSIS — M8580 Other specified disorders of bone density and structure, unspecified site: Secondary | ICD-10-CM

## 2021-12-11 DIAGNOSIS — I700 Atherosclerosis of aorta: Secondary | ICD-10-CM | POA: Diagnosis not present

## 2021-12-11 DIAGNOSIS — Z9079 Acquired absence of other genital organ(s): Secondary | ICD-10-CM

## 2021-12-11 DIAGNOSIS — S3282XG Multiple fractures of pelvis without disruption of pelvic ring, subsequent encounter for fracture with delayed healing: Secondary | ICD-10-CM | POA: Diagnosis not present

## 2021-12-11 DIAGNOSIS — W309XXD Contact with unspecified agricultural machinery, subsequent encounter: Secondary | ICD-10-CM

## 2021-12-11 DIAGNOSIS — I130 Hypertensive heart and chronic kidney disease with heart failure and stage 1 through stage 4 chronic kidney disease, or unspecified chronic kidney disease: Secondary | ICD-10-CM | POA: Diagnosis not present

## 2021-12-11 DIAGNOSIS — S62307G Unspecified fracture of fifth metacarpal bone, left hand, subsequent encounter for fracture with delayed healing: Secondary | ICD-10-CM | POA: Diagnosis not present

## 2021-12-11 DIAGNOSIS — D630 Anemia in neoplastic disease: Secondary | ICD-10-CM

## 2021-12-11 DIAGNOSIS — Z8719 Personal history of other diseases of the digestive system: Secondary | ICD-10-CM

## 2021-12-11 DIAGNOSIS — K5730 Diverticulosis of large intestine without perforation or abscess without bleeding: Secondary | ICD-10-CM

## 2021-12-11 DIAGNOSIS — I3139 Other pericardial effusion (noninflammatory): Secondary | ICD-10-CM

## 2021-12-11 DIAGNOSIS — I340 Nonrheumatic mitral (valve) insufficiency: Secondary | ICD-10-CM | POA: Diagnosis not present

## 2021-12-11 DIAGNOSIS — Z8546 Personal history of malignant neoplasm of prostate: Secondary | ICD-10-CM

## 2021-12-11 DIAGNOSIS — E785 Hyperlipidemia, unspecified: Secondary | ICD-10-CM

## 2021-12-11 DIAGNOSIS — N184 Chronic kidney disease, stage 4 (severe): Secondary | ICD-10-CM | POA: Diagnosis not present

## 2021-12-11 DIAGNOSIS — Z87891 Personal history of nicotine dependence: Secondary | ICD-10-CM

## 2021-12-11 DIAGNOSIS — Z9181 History of falling: Secondary | ICD-10-CM

## 2021-12-11 DIAGNOSIS — H8110 Benign paroxysmal vertigo, unspecified ear: Secondary | ICD-10-CM

## 2021-12-11 DIAGNOSIS — Z86010 Personal history of colonic polyps: Secondary | ICD-10-CM

## 2021-12-11 DIAGNOSIS — N281 Cyst of kidney, acquired: Secondary | ICD-10-CM | POA: Diagnosis not present

## 2021-12-11 DIAGNOSIS — I509 Heart failure, unspecified: Secondary | ICD-10-CM | POA: Diagnosis not present

## 2021-12-11 DIAGNOSIS — J439 Emphysema, unspecified: Secondary | ICD-10-CM | POA: Diagnosis not present

## 2021-12-25 ENCOUNTER — Other Ambulatory Visit (INDEPENDENT_AMBULATORY_CARE_PROVIDER_SITE_OTHER): Payer: Medicare PPO

## 2021-12-25 DIAGNOSIS — E039 Hypothyroidism, unspecified: Secondary | ICD-10-CM

## 2021-12-25 DIAGNOSIS — D509 Iron deficiency anemia, unspecified: Secondary | ICD-10-CM | POA: Diagnosis not present

## 2021-12-25 LAB — CBC WITH DIFFERENTIAL/PLATELET
Basophils Absolute: 0 10*3/uL (ref 0.0–0.1)
Basophils Relative: 0.6 % (ref 0.0–3.0)
Eosinophils Absolute: 0.2 10*3/uL (ref 0.0–0.7)
Eosinophils Relative: 2.9 % (ref 0.0–5.0)
HCT: 27.9 % — ABNORMAL LOW (ref 39.0–52.0)
Hemoglobin: 9.4 g/dL — ABNORMAL LOW (ref 13.0–17.0)
Lymphocytes Relative: 8.5 % — ABNORMAL LOW (ref 12.0–46.0)
Lymphs Abs: 0.6 10*3/uL — ABNORMAL LOW (ref 0.7–4.0)
MCHC: 33.8 g/dL (ref 30.0–36.0)
MCV: 86.1 fl (ref 78.0–100.0)
Monocytes Absolute: 0.6 10*3/uL (ref 0.1–1.0)
Monocytes Relative: 8.8 % (ref 3.0–12.0)
Neutro Abs: 5.4 10*3/uL (ref 1.4–7.7)
Neutrophils Relative %: 79.2 % — ABNORMAL HIGH (ref 43.0–77.0)
Platelets: 317 10*3/uL (ref 150.0–400.0)
RBC: 3.24 Mil/uL — ABNORMAL LOW (ref 4.22–5.81)
RDW: 18.3 % — ABNORMAL HIGH (ref 11.5–15.5)
WBC: 6.8 10*3/uL (ref 4.0–10.5)

## 2021-12-25 LAB — IRON: Iron: 35 ug/dL — ABNORMAL LOW (ref 42–165)

## 2021-12-25 LAB — BASIC METABOLIC PANEL
BUN: 21 mg/dL (ref 6–23)
CO2: 25 mEq/L (ref 19–32)
Calcium: 8.5 mg/dL (ref 8.4–10.5)
Chloride: 96 mEq/L (ref 96–112)
Creatinine, Ser: 2.44 mg/dL — ABNORMAL HIGH (ref 0.40–1.50)
GFR: 22.67 mL/min — ABNORMAL LOW (ref >60.00)
Glucose, Bld: 124 mg/dL — ABNORMAL HIGH (ref 70–99)
Potassium: 4.1 mEq/L (ref 3.5–5.1)
Sodium: 129 mEq/L — ABNORMAL LOW (ref 135–145)

## 2021-12-25 LAB — TSH: TSH: 6.25 u[IU]/mL — ABNORMAL HIGH (ref 0.35–5.50)

## 2021-12-25 NOTE — Addendum Note (Signed)
Addended by: Ellamae Sia on: 12/25/2021 10:15 AM   Modules accepted: Orders

## 2021-12-28 ENCOUNTER — Other Ambulatory Visit: Payer: Self-pay

## 2021-12-28 ENCOUNTER — Emergency Department (HOSPITAL_COMMUNITY)
Admission: EM | Admit: 2021-12-28 | Discharge: 2021-12-28 | Disposition: A | Discharge status: Home / Self Care | Payer: Medicare PPO | Attending: Emergency Medicine | Admitting: Emergency Medicine

## 2021-12-28 ENCOUNTER — Encounter (HOSPITAL_COMMUNITY): Payer: Self-pay | Admitting: Emergency Medicine

## 2021-12-28 ENCOUNTER — Emergency Department (HOSPITAL_COMMUNITY): Payer: Medicare PPO

## 2021-12-28 DIAGNOSIS — N189 Chronic kidney disease, unspecified: Secondary | ICD-10-CM | POA: Insufficient documentation

## 2021-12-28 DIAGNOSIS — N3001 Acute cystitis with hematuria: Secondary | ICD-10-CM | POA: Diagnosis not present

## 2021-12-28 DIAGNOSIS — S32018A Other fracture of first lumbar vertebra, initial encounter for closed fracture: Secondary | ICD-10-CM | POA: Diagnosis not present

## 2021-12-28 DIAGNOSIS — S32010A Wedge compression fracture of first lumbar vertebra, initial encounter for closed fracture: Secondary | ICD-10-CM

## 2021-12-28 DIAGNOSIS — I129 Hypertensive chronic kidney disease with stage 1 through stage 4 chronic kidney disease, or unspecified chronic kidney disease: Secondary | ICD-10-CM | POA: Diagnosis not present

## 2021-12-28 DIAGNOSIS — K5730 Diverticulosis of large intestine without perforation or abscess without bleeding: Secondary | ICD-10-CM | POA: Diagnosis not present

## 2021-12-28 DIAGNOSIS — E871 Hypo-osmolality and hyponatremia: Secondary | ICD-10-CM | POA: Diagnosis not present

## 2021-12-28 DIAGNOSIS — N200 Calculus of kidney: Secondary | ICD-10-CM | POA: Insufficient documentation

## 2021-12-28 DIAGNOSIS — K8020 Calculus of gallbladder without cholecystitis without obstruction: Secondary | ICD-10-CM | POA: Diagnosis not present

## 2021-12-28 DIAGNOSIS — N179 Acute kidney failure, unspecified: Secondary | ICD-10-CM | POA: Diagnosis not present

## 2021-12-28 DIAGNOSIS — D649 Anemia, unspecified: Secondary | ICD-10-CM | POA: Insufficient documentation

## 2021-12-28 DIAGNOSIS — Z79899 Other long term (current) drug therapy: Secondary | ICD-10-CM | POA: Insufficient documentation

## 2021-12-28 DIAGNOSIS — Z8546 Personal history of malignant neoplasm of prostate: Secondary | ICD-10-CM | POA: Diagnosis not present

## 2021-12-28 DIAGNOSIS — S3219XA Other fracture of sacrum, initial encounter for closed fracture: Secondary | ICD-10-CM | POA: Diagnosis not present

## 2021-12-28 DIAGNOSIS — S3992XA Unspecified injury of lower back, initial encounter: Secondary | ICD-10-CM | POA: Diagnosis present

## 2021-12-28 LAB — CBC WITH DIFFERENTIAL/PLATELET
Abs Immature Granulocytes: 0.04 10*3/uL (ref 0.00–0.07)
Basophils Absolute: 0.1 10*3/uL (ref 0.0–0.1)
Basophils Relative: 1 %
Eosinophils Absolute: 0.3 10*3/uL (ref 0.0–0.5)
Eosinophils Relative: 4 %
HCT: 29.7 % — ABNORMAL LOW (ref 39.0–52.0)
Hemoglobin: 9.6 g/dL — ABNORMAL LOW (ref 13.0–17.0)
Immature Granulocytes: 1 %
Lymphocytes Relative: 16 %
Lymphs Abs: 1.2 10*3/uL (ref 0.7–4.0)
MCH: 28.8 pg (ref 26.0–34.0)
MCHC: 32.3 g/dL (ref 30.0–36.0)
MCV: 89.2 fL (ref 80.0–100.0)
Monocytes Absolute: 0.6 10*3/uL (ref 0.1–1.0)
Monocytes Relative: 8 %
Neutro Abs: 5.2 10*3/uL (ref 1.7–7.7)
Neutrophils Relative %: 70 %
Platelets: 426 10*3/uL — ABNORMAL HIGH (ref 150–400)
RBC: 3.33 MIL/uL — ABNORMAL LOW (ref 4.22–5.81)
RDW: 17.2 % — ABNORMAL HIGH (ref 11.5–15.5)
WBC: 7.4 10*3/uL (ref 4.0–10.5)
nRBC: 0 % (ref 0.0–0.2)

## 2021-12-28 LAB — BASIC METABOLIC PANEL
Anion gap: 11 (ref 5–15)
BUN: 21 mg/dL (ref 8–23)
CO2: 23 mmol/L (ref 22–32)
Calcium: 8.7 mg/dL — ABNORMAL LOW (ref 8.9–10.3)
Chloride: 95 mmol/L — ABNORMAL LOW (ref 98–111)
Creatinine, Ser: 3.04 mg/dL — ABNORMAL HIGH (ref 0.61–1.24)
GFR, Estimated: 19 mL/min — ABNORMAL LOW (ref >60)
Glucose, Bld: 99 mg/dL (ref 70–99)
Potassium: 4.1 mmol/L (ref 3.5–5.1)
Sodium: 129 mmol/L — ABNORMAL LOW (ref 135–145)

## 2021-12-28 LAB — URINALYSIS, ROUTINE W REFLEX MICROSCOPIC
RBC / HPF: >50 RBC/hpf — ABNORMAL HIGH (ref 0–5)
WBC, UA: >50 WBC/hpf — ABNORMAL HIGH (ref 0–5)

## 2021-12-28 MED ORDER — AMOXICILLIN-POT CLAVULANATE 875-125 MG PO TABS: 1.0000 | ORAL_TABLET | Freq: Two times a day (BID) | ORAL | 0 refills | Status: DC

## 2021-12-28 MED ORDER — ACETAMINOPHEN 500 MG PO TABS
1000.0000 mg | ORAL_TABLET | Freq: Once | ORAL | Status: AC
  Administered 2021-12-28: 1000 mg via ORAL
  Filled 2021-12-28: qty 2

## 2021-12-28 MED ORDER — AMOXICILLIN-POT CLAVULANATE 875-125 MG PO TABS
1.0000 | ORAL_TABLET | Freq: Once | ORAL | Status: AC
  Administered 2021-12-28: 1 via ORAL
  Filled 2021-12-28: qty 1

## 2021-12-28 NOTE — ED Provider Triage Note (Signed)
Emergency Medicine Provider Triage Evaluation Note  Rick [name] , a 88 y.o. male  was evaluated in triage.  Pt complains of hematuria, intermittent x1 week.  Denies dysuria but is having some flank pain.  No fever, chills, vomiting.  Unsure if any prior hx of kidney stones. Not on anticoagulation.  Review of Systems  Positive: Hematuria, flank pain Negative: fever  Physical Exam  BP 130/88   Pulse (!) 102   Temp 97.8 F (36.6 C) (Oral)   Resp 16   SpO2 100%  Gen:   Awake, no distress   Resp:  Normal effort  MSK:   Moves extremities without difficulty  Other:    Medical Decision Making  Medically screening exam initiated at 3:23 AM.  Appropriate orders placed.  MITSUO BUDNICK was informed that the remainder of the evaluation will be completed by another provider, this initial triage assessment does not replace that evaluation, and the importance of remaining in the ED until their evaluation is complete.  Hematuria, intermittent x 1 week.  Also having some flank pain.  Will check labs, UA, CT stone study.   Larene Pickett, PA-C 12/28/21 [phone]

## 2021-12-28 NOTE — ED Notes (Signed)
Pt reports having hematuria and lower back pain x 1 week. Denies N/V/D. Pt A&O x4 with family at bedside. Updated on plan of care.

## 2021-12-28 NOTE — ED Provider Notes (Signed)
[facility] EMERGENCY DEPARTMENT Provider Note   CSN: [id number] Arrival date & time: 12/28/21  0250     History  Chief Complaint  Patient presents with   Hematuria    Rick [name] is a 88 y.o. male.  Patient is a very pleasant 88 year old male with a history of prior prostate cancer that did not require radiation, hypertension, large right renal cysts, CKD, large known left renal staghorn calculus and mitral regurgitation who is presenting today with complaints of hematuria that have been intermittent this whole week and seem to be getting slightly worse.  He has also had some mild suprapubic discomfort associated with this but denies any fever, nausea, vomiting, appetite changes.  He feels like he is emptying his bladder completely when he does go and denies symptoms of retention.  He denies passing blood clots.  He unfortunately has waited for 13 hours but reports within that waiting he does not feel any different than he did when he came except that his back has been more uncomfortable.  He reports since his tractor accident in May he has had ongoing back pain and he has been taking Aleve for this more recently.  He denies any other recent falls or injuries.  He has been getting physical therapy at home. He has been taking Aleve regularly recently for his back pain  The history is provided by the patient.  Hematuria       Home Medications Prior to Admission medications   Medication Sig Start Date End Date Taking? Authorizing Provider  amoxicillin-clavulanate (AUGMENTIN) 875-125 MG tablet Take 1 tablet by mouth every 12 (twelve) hours. 12/28/21  Yes Blanchie Dessert, MD  acetaminophen (TYLENOL) 325 MG tablet Take 2 tablets (650 mg total) by mouth every 6 (six) hours as needed. 11/01/21   Jill Alexanders, PA-C  amLODipine (NORVASC) 10 MG tablet Take 10 mg by mouth daily. 03/29/21   [provider]  feeding supplement (ENSURE ENLIVE / ENSURE PLUS)  LIQD Take 237 mLs by mouth 2 (two) times daily between meals. 11/01/21   Jill Alexanders, PA-C  ferrous sulfate 325 (65 FE) MG tablet Take 1 tablet (325 mg total) by mouth daily with breakfast. 11/21/21 11/21/22  Tonia Ghent, MD  latanoprost (XALATAN) 0.005 % ophthalmic solution Place 1 drop into both eyes at bedtime.    [provider]  levothyroxine (SYNTHROID) 25 MCG tablet Take 1 tablet (25 mcg total) by mouth daily. Take on empty stomach early in the morning. 09/11/21   Tonia Ghent, MD  losartan (COZAAR) 100 MG tablet Take 100 mg by mouth daily. 07/29/21   [provider]  omeprazole (PRILOSEC OTC) 20 MG tablet Take 1 tablet (20 mg total) by mouth daily. 12/07/20   Tonia Ghent, MD  sodium bicarbonate 650 MG tablet Take 2 tablets (1,300 mg total) by mouth 2 (two) times daily. 11/01/21   Jill Alexanders, PA-C      Allergies    Lisinopril and Nsaids    Review of Systems   Review of Systems  Genitourinary:  Positive for hematuria.    Physical Exam Updated Vital Signs BP (!) 132/94 (BP Location: Right Arm)   Pulse 80   Temp 98.2 F (36.8 C)   Resp 16   SpO2 (!) 89%  Physical Exam Vitals and nursing note reviewed.  Constitutional:      General: He is not in acute distress.    Appearance: He is well-developed.  HENT:  Head: Normocephalic and atraumatic.  Eyes:     Conjunctiva/sclera: Conjunctivae normal.     Pupils: Pupils are equal, round, and reactive to light.  Cardiovascular:     Rate and Rhythm: Normal rate and regular rhythm.     Heart sounds: No murmur heard. Pulmonary:     Effort: Pulmonary effort is normal. No respiratory distress.     Breath sounds: Normal breath sounds. No wheezing or rales.  Abdominal:     General: There is no distension.     Palpations: Abdomen is soft.     Tenderness: There is abdominal tenderness. There is no guarding or rebound.     Comments: Mild suprapubic tenderness  Musculoskeletal:        General:  Tenderness present. Normal range of motion.     Cervical back: Normal range of motion and neck supple.       Back:     Right lower leg: No edema.     Left lower leg: No edema.     Comments: Tenderness to the mid back with palpation  Skin:    General: Skin is warm and dry.     Findings: No erythema or rash.  Neurological:     Mental Status: He is alert and oriented to person, place, and time. Mental status is at baseline.  Psychiatric:        Mood and Affect: Mood normal.        Behavior: Behavior normal.     ED Results / Procedures / Treatments   Labs (all labs ordered are listed, but only abnormal results are displayed) Labs Reviewed  CBC WITH DIFFERENTIAL/PLATELET - Abnormal; Notable for the following components:      Result Value   RBC 3.33 (*)    Hemoglobin 9.6 (*)    HCT 29.7 (*)    RDW 17.2 (*)    Platelets 426 (*)    All other components within normal limits  BASIC METABOLIC PANEL - Abnormal; Notable for the following components:   Sodium 129 (*)    Chloride 95 (*)    Creatinine, Ser 3.04 (*)    Calcium 8.7 (*)    GFR, Estimated 19 (*)    All other components within normal limits  URINALYSIS, ROUTINE W REFLEX MICROSCOPIC - Abnormal; Notable for the following components:   Color, Urine RED (*)    APPearance TURBID (*)    Glucose, UA   (*)    Value: TEST NOT REPORTED DUE TO COLOR INTERFERENCE OF URINE PIGMENT   Hgb urine dipstick   (*)    Value: TEST NOT REPORTED DUE TO COLOR INTERFERENCE OF URINE PIGMENT   Bilirubin Urine   (*)    Value: TEST NOT REPORTED DUE TO COLOR INTERFERENCE OF URINE PIGMENT   Ketones, ur   (*)    Value: TEST NOT REPORTED DUE TO COLOR INTERFERENCE OF URINE PIGMENT   Protein, ur   (*)    Value: TEST NOT REPORTED DUE TO COLOR INTERFERENCE OF URINE PIGMENT   Nitrite   (*)    Value: TEST NOT REPORTED DUE TO COLOR INTERFERENCE OF URINE PIGMENT   Leukocytes,Ua   (*)    Value: TEST NOT REPORTED DUE TO COLOR INTERFERENCE OF URINE PIGMENT    RBC / HPF >50 (*)    WBC, UA >50 (*)    Bacteria, UA MANY (*)    All other components within normal limits  URINE CULTURE    EKG None  Radiology CT Renal Stone  Study  Result Date: 12/28/2021 CLINICAL DATA:  Flank pain.  Kidney stones suspected. EXAM: CT ABDOMEN AND PELVIS WITHOUT CONTRAST TECHNIQUE: Multidetector CT imaging of the abdomen and pelvis was performed following the standard protocol without IV contrast. RADIATION DOSE REDUCTION: This exam was performed according to the departmental dose-optimization program which includes automated exposure control, adjustment of the mA and/or kV according to patient size and/or use of iterative reconstruction technique. COMPARISON:  10/22/2021 FINDINGS: Lower chest: Centrilobular emphsyema noted. Hepatobiliary: No suspicious focal abnormality in the liver on this study without intravenous contrast. Large gallstone again evident. No intrahepatic or extrahepatic biliary dilation. Pancreas: Anatomy is markedly distorted by the large right renal cyst. Spleen: No splenomegaly. No focal mass lesion. Adrenals/Urinary Tract: Not well visualized. Multiple large cystic lesions are seen in the right kidney, displacing the right kidney medially and inferiorly. This may be one complex multi septated cyst or multiple adjacent cystic lesions. Appearance is not substantially changed in the interval with cystic disease in the right kidney measuring approximately 22.7 x 20.1 x 19 cm. No evidence for hydronephrosis in the right kidney. 2 mm nonobstructing stone noted in the lower pole. Bulky cystic disease noted in the left kidney is well with the large staghorn calculus again noted in the left renal pelvis. This stone measures on the order of 6.3 x 4.8 x 3.7 cm additional bulky stones are seen in the lower pole left kidney. No hydronephrosis in either kidney. No overt left hydroureter. Bladder is nondistended. Stomach/Bowel: Stomach is unremarkable. No gastric wall  thickening. No evidence of outlet obstruction. No small bowel wall thickening. No small bowel dilatation. No gross colonic mass. No colonic wall thickening. Diverticular changes are noted in the left colon without evidence of diverticulitis. Vascular/Lymphatic: There is moderate atherosclerotic calcification of the abdominal aorta without aneurysm. There is no gastrohepatic or hepatoduodenal ligament lymphadenopathy. No retroperitoneal or mesenteric lymphadenopathy. No pelvic sidewall lymphadenopathy. Reproductive: Prostate gland surgically absent. Other: No intraperitoneal free fluid. Musculoskeletal: Small right groin hernia contains only fat. Bilateral superior and inferior pubic ramus fractures noted previously demonstrates some interval development of bony callus although fracture lines are still clearly visible there is a new left-sided sacral insufficiency fracture (image 50/3). Interval development of marked compression deformity at L1. Superior endplate compression deformity at T11 and L4 is also new in the interval. No worrisome lytic or sclerotic osseous abnormality. IMPRESSION: 1. New compression deformity at L1 with greater than 75% loss of height anteriorly and approximately 50% loss of height posteriorly at this level. This is associated with new superior endplate compression deformity at T11 and L4. There is a new left-sided sacral insufficiency fracture. 2. Bilateral superior and inferior pubic ramus fractures noted previously demonstrate some interval development of bony callus although fracture lines are still clearly visible. 3. Large staghorn calculus in the left renal pelvis with additional bulky stones in the lower pole left kidney, similar to prior. 4. Multiple large cystic lesions in the right kidney, displacing the right kidney medially and inferiorly. This may be one complex multi septated cyst or multiple adjacent cystic lesions. Appearance is not substantially changed in the interval. 5.  Cholelithiasis. 6. Left colonic diverticulosis without diverticulitis. 7. Small right groin hernia contains only fat. 8. Aortic Atherosclerosis (ICD10-I70.0) and Emphysema (ICD10-J43.9). Electronically Signed   By: Misty Stanley M.D.   On: 12/28/2021 06:17    Procedures Procedures    Medications Ordered in ED Medications  acetaminophen (TYLENOL) tablet 1,000 mg (1,000 mg Oral Given 12/28/21  1658)  amoxicillin-clavulanate (AUGMENTIN) 875-125 MG per tablet 1 tablet (1 tablet Oral Given 12/28/21 1658)    ED Course/ Medical Decision Making/ A&P                           Medical Decision Making Amount and/or Complexity of Data Reviewed External Data Reviewed: notes. Labs: ordered. Decision-making details documented in ED Course. Radiology: ordered and independent interpretation performed. Decision-making details documented in ED Course.  Risk OTC drugs. Prescription drug management.   Pt with multiple medical problems and comorbidities and presenting today with a complaint that caries a high risk for morbidity and mortality.  Here today with complaints of hematuria and back pain.  Patient is not having systemic symptoms concerning for pyelonephritis today.  The back pain that he complains of is central back pain.  He is not having any symptoms consistent with urinary retention.  Bedside ultrasound showed no evidence of urinary retention.  Patient has minimal suprapubic discomfort.  He is awake and alert.  I independently interpreted patient's labs today and urine does show concern for infection with greater than 50 white and red cells with many bacteria.  CBC shows normal white blood cell count of 7.4 and Hb is stable at 9.6.  BMP showed mild hyponatremia of 129 and patient does have a bump in his creatinine to 3.04 from his baseline of 2.4.  Suspect this might be related to him taking Aleve recently for his back pain. I have independently visualized and interpreted pt's images today.  CT renal  shows a large stone in the left kidney and cysts in the right kidney.  Radiology reports new compression deformity at L1 with greater than 75% loss of height anteriorly and approximately 50% loss of height posteriorly with new superior endplate compression deformity at T11 and L4 with some new left-sided sacral insufficiency fractures.  His bilateral pubic rami fracture appear to be healing from his tractor accident on 10/22/2021 and he does have a large staghorn calculus in the left pelvis which is unchanged and multiple large cyst in the right kidney which are unchanged.  Given these above findings I consulted neurosurgery and urology.  Neurosurgery with Dr. Venetia Constable reported patient can follow-up with him in 1 to 2 weeks in the office but at this time he does not need a brace or any intervention.  Spoke with urologist Dr. Garen Lah who at this time recommended that patient could go home with 2 weeks of oral antibiotics but needs follow-up with urology in the office.  Given patient does not have any stomach symptoms at this time feel that this is reasonable.  Findings discussed with the patient and his family.  He was counseled in discontinuing the Aleve due to his renal function and requested that he follow-up this week for repeat renal function.  Patient and his family are comfortable with this plan.  Discussed diclofenac gel and continuing Tylenol.          Final Clinical Impression(s) / ED Diagnoses Final diagnoses:  Acute cystitis with hematuria  Staghorn renal calculus  Compression fracture of L1 vertebra, initial encounter ([place])  AKI (acute kidney injury) ([place])    Rx / DC Orders ED Discharge Orders          Ordered    amoxicillin-clavulanate (AUGMENTIN) 875-125 MG tablet  Every 12 hours        12/28/21 1724  Blanchie Dessert, MD 12/28/21 1725

## 2021-12-28 NOTE — Discharge Instructions (Addendum)
Avoid taking the Aleve because it is stressing your kidneys.  You can use tylenol extra strength every 6 hours and VOLTAREN gel which you can just rub where it hurts.  Also you will be on an antibiotic for the next 2 weeks and you will need to see the urologist about the stone in your kidney.  If you start having vomiting, fever, confusion or feeling bad all over return to the ER

## 2021-12-28 NOTE — ED Triage Notes (Signed)
Patient here with blood in his urine.  He states that he has had it for a week or so.  Now with some lower back pain.  He denies any other urinary problem, no fevers, nausea or vomiting.

## 2021-12-29 ENCOUNTER — Telehealth: Payer: Self-pay | Admitting: Family Medicine

## 2021-12-29 DIAGNOSIS — E039 Hypothyroidism, unspecified: Secondary | ICD-10-CM

## 2021-12-29 DIAGNOSIS — N184 Chronic kidney disease, stage 4 (severe): Secondary | ICD-10-CM

## 2021-12-29 DIAGNOSIS — Z8781 Personal history of (healed) traumatic fracture: Secondary | ICD-10-CM

## 2021-12-29 LAB — URINE CULTURE: Culture: >=100000 — AB

## 2021-12-29 MED ORDER — LEVOTHYROXINE SODIUM 50 MCG PO TABS: 50.0000 ug | ORAL_TABLET | Freq: Every day | ORAL | 3 refills | Status: DC

## 2021-12-29 NOTE — Addendum Note (Signed)
Addended by: Tonia Ghent on: 12/29/2021 02:25 PM   Modules accepted: Orders

## 2021-12-29 NOTE — Telephone Encounter (Addendum)
See result note.  Please check with patient and make sure he has follow-up pending with urology and neurosurgery/spine clinic.  Please set up lab visit for later this week.  I put in the follow-up order to recheck his creatinine, since it was worse in the emergency room.  Make sure he is well-hydrated on recheck.    His iron level still low.  His hemoglobin is still low but stable.  Please verify that he is still taking iron daily and able to tolerate that.  Either way please let me know.  We can make plans about rechecking his iron and CBC later on.  His TSH is better but still abnormal and I would increase his levothyroxine to 50 mcg daily.  He can take 2 of the 25 mcg at 1 time in the morning to use up his current supply and then change to the new prescription.  When he changes to the new prescription it is the higher strength so he will only need to take 1 a day at that point.  We need to recheck his TSH in about 2 months.  We will likely recheck his iron and CBC at that point.  Thanks.

## 2021-12-30 NOTE — Telephone Encounter (Signed)
Patient's wife notified of lab results and verbalized understanding. [name] stated they called urology for appt but they needed to call him back with an appt date and time. Neurosurgery is scheduled for 01/06/22. Patient has been taking iron daily. Patient was set up for lab appt tomorrow and lab appt for 02/27/22 at 9:00 am.

## 2021-12-30 NOTE — Telephone Encounter (Signed)
LMTCB

## 2021-12-30 NOTE — Telephone Encounter (Signed)
Pt wife returning called requesting a call back . Labs was schedule for pt . [phone]

## 2021-12-31 ENCOUNTER — Other Ambulatory Visit (INDEPENDENT_AMBULATORY_CARE_PROVIDER_SITE_OTHER): Payer: Medicare PPO

## 2021-12-31 DIAGNOSIS — N184 Chronic kidney disease, stage 4 (severe): Secondary | ICD-10-CM

## 2021-12-31 DIAGNOSIS — Z8781 Personal history of (healed) traumatic fracture: Secondary | ICD-10-CM | POA: Diagnosis not present

## 2021-12-31 LAB — BASIC METABOLIC PANEL
BUN: 24 mg/dL — ABNORMAL HIGH (ref 6–23)
CO2: 25 mEq/L (ref 19–32)
Calcium: 8.7 mg/dL (ref 8.4–10.5)
Chloride: 95 mEq/L — ABNORMAL LOW (ref 96–112)
Creatinine, Ser: 2.75 mg/dL — ABNORMAL HIGH (ref 0.40–1.50)
GFR: 19.64 mL/min — ABNORMAL LOW (ref >60.00)
Glucose, Bld: 71 mg/dL (ref 70–99)
Potassium: 3.9 mEq/L (ref 3.5–5.1)
Sodium: 131 mEq/L — ABNORMAL LOW (ref 135–145)

## 2021-12-31 LAB — VITAMIN D 25 HYDROXY (VIT D DEFICIENCY, FRACTURES): VITD: 29.06 ng/mL — ABNORMAL LOW (ref 30.00–100.00)

## 2021-12-31 NOTE — Telephone Encounter (Signed)
Noted. Thanks.

## 2022-01-01 MED ORDER — VITAMIN D3 25 MCG (1000 UT) PO CAPS: 1000.0000 [IU] | ORAL_CAPSULE | Freq: Every day | ORAL | Status: DC

## 2022-01-01 NOTE — Addendum Note (Signed)
Addended by: Tonia Ghent on: 01/01/2022 11:33 AM   Modules accepted: Orders

## 2022-01-01 NOTE — Telephone Encounter (Signed)
See result note.  Thanks!

## 2022-01-03 ENCOUNTER — Telehealth: Payer: Self-pay | Admitting: Family Medicine

## 2022-01-03 NOTE — Telephone Encounter (Signed)
Patient wife called and stated that she was returning a phone call. Call back number [phone]

## 2022-01-03 NOTE — Telephone Encounter (Signed)
lmtcb

## 2022-01-06 ENCOUNTER — Telehealth: Payer: Self-pay

## 2022-01-06 DIAGNOSIS — N184 Chronic kidney disease, stage 4 (severe): Secondary | ICD-10-CM | POA: Diagnosis not present

## 2022-01-06 DIAGNOSIS — R609 Edema, unspecified: Secondary | ICD-10-CM | POA: Diagnosis not present

## 2022-01-06 DIAGNOSIS — N2581 Secondary hyperparathyroidism of renal origin: Secondary | ICD-10-CM | POA: Diagnosis not present

## 2022-01-06 DIAGNOSIS — E871 Hypo-osmolality and hyponatremia: Secondary | ICD-10-CM | POA: Diagnosis not present

## 2022-01-06 DIAGNOSIS — D631 Anemia in chronic kidney disease: Secondary | ICD-10-CM | POA: Diagnosis not present

## 2022-01-06 DIAGNOSIS — Q613 Polycystic kidney, unspecified: Secondary | ICD-10-CM | POA: Diagnosis not present

## 2022-01-06 DIAGNOSIS — I129 Hypertensive chronic kidney disease with stage 1 through stage 4 chronic kidney disease, or unspecified chronic kidney disease: Secondary | ICD-10-CM | POA: Diagnosis not present

## 2022-01-06 LAB — HEPATIC FUNCTION PANEL
ALT: 7 U/L — AB (ref 10–40)
AST: 16 (ref 14–40)
Alkaline Phosphatase: 114 (ref 25–125)

## 2022-01-06 LAB — IRON,TIBC AND FERRITIN PANEL
Ferritin: 124
Iron: 79

## 2022-01-06 LAB — CBC AND DIFFERENTIAL
Hemoglobin: 11.1 — AB (ref 13.5–17.5)
Platelets: 385 10*3/uL (ref 150–400)

## 2022-01-06 LAB — BASIC METABOLIC PANEL
Chloride: 93 — AB (ref 99–108)
Creatinine: 2.3 — AB (ref <=1.3)
Glucose: 83
Sodium: 129 — AB (ref 137–147)

## 2022-01-06 LAB — COMPREHENSIVE METABOLIC PANEL: Albumin: 3.5 (ref 3.5–5.0)

## 2022-01-06 NOTE — Telephone Encounter (Signed)
-----   Message from Tonia Ghent, MD sent at 01/01/2022 11:35 AM EDT ----- Notify pt.  Kidney function is slightly better.  Vit D is low.  I would start taking 1000 units vit D daily, he can get that OTC.  Would still recheck labs as planned in 02/2022.  I put in the follow up orders.  I will await his follow up notes.  Thanks.

## 2022-01-06 NOTE — Telephone Encounter (Signed)
Patient notified of lab results and verbalized understanding.  ° °

## 2022-01-06 NOTE — Telephone Encounter (Signed)
I left a message for the patient to return my call.

## 2022-01-17 DIAGNOSIS — N200 Calculus of kidney: Secondary | ICD-10-CM | POA: Diagnosis not present

## 2022-01-17 DIAGNOSIS — Q613 Polycystic kidney, unspecified: Secondary | ICD-10-CM | POA: Diagnosis not present

## 2022-01-17 DIAGNOSIS — R310 Gross hematuria: Secondary | ICD-10-CM | POA: Diagnosis not present

## 2022-01-28 ENCOUNTER — Encounter (INDEPENDENT_AMBULATORY_CARE_PROVIDER_SITE_OTHER): Payer: Medicare PPO | Admitting: Ophthalmology

## 2022-02-05 ENCOUNTER — Encounter (INDEPENDENT_AMBULATORY_CARE_PROVIDER_SITE_OTHER): Payer: Self-pay | Admitting: Ophthalmology

## 2022-02-05 ENCOUNTER — Ambulatory Visit (INDEPENDENT_AMBULATORY_CARE_PROVIDER_SITE_OTHER): Payer: Medicare PPO | Admitting: Ophthalmology

## 2022-02-05 DIAGNOSIS — H3561 Retinal hemorrhage, right eye: Secondary | ICD-10-CM

## 2022-02-05 DIAGNOSIS — H35011 Changes in retinal vascular appearance, right eye: Secondary | ICD-10-CM

## 2022-02-05 DIAGNOSIS — H35031 Hypertensive retinopathy, right eye: Secondary | ICD-10-CM

## 2022-02-05 NOTE — Assessment & Plan Note (Signed)
Continue blood pressure control important

## 2022-02-05 NOTE — Progress Notes (Signed)
02/05/2022     CHIEF COMPLAINT Patient presents for No chief complaint on file.     HISTORY OF PRESENT ILLNESS: Rick [name] is a 88 y.o. 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      Referring physician: Clent Jacks, MD [address] [place],  [place] [address]  HISTORICAL INFORMATION:   Selected notes from the MEDICAL RECORD NUMBER       CURRENT MEDICATIONS: Current Outpatient Medications (Ophthalmic Drugs)  Medication Sig   latanoprost (XALATAN) 0.005 % ophthalmic solution Place 1 drop into both eyes at bedtime.   No current facility-administered medications for this visit. (Ophthalmic Drugs)   Current Outpatient Medications (Other)  Medication Sig   acetaminophen (TYLENOL) 325 MG tablet Take 2 tablets (650 mg total) by mouth every 6 (six) hours as needed.   amLODipine (NORVASC) 10 MG tablet Take 10 mg by mouth daily.   amoxicillin-clavulanate (AUGMENTIN) 875-125 MG tablet Take 1 tablet by mouth every 12 (twelve) hours.   Cholecalciferol (VITAMIN D3) 25 MCG (1000 UT) CAPS Take 1 capsule (1,000 Units total) by mouth daily.   feeding supplement (ENSURE ENLIVE / ENSURE PLUS) LIQD Take 237 mLs by mouth 2 (two) times daily between meals.   ferrous sulfate 325 (65 FE) MG tablet Take 1 tablet (325 mg total) by mouth daily with breakfast.   levothyroxine (SYNTHROID) 50 MCG tablet Take 1 tablet (50 mcg total) by mouth daily. Take on empty stomach early in the morning.   losartan (COZAAR) 100 MG tablet Take 100 mg by mouth daily.   omeprazole (PRILOSEC OTC) 20 MG tablet Take 1 tablet (20 mg total) by mouth daily.   sodium bicarbonate 650 MG tablet Take 2 tablets (1,300 mg total) by mouth 2 (two) times daily.   No current  facility-administered medications for this visit. (Other)      REVIEW OF SYSTEMS: ROS   Negative for: Constitutional, Gastrointestinal, Neurological, Skin, Genitourinary, Musculoskeletal, HENT, Endocrine, Cardiovascular, Eyes, Respiratory, Psychiatric, Allergic/Imm, Heme/Lymph Last edited by Morene Rankins, CMA on 02/05/2022 11:03 AM.       ALLERGIES Allergies  Allergen Reactions   Lisinopril     cough   Nsaids     Elevated Cr    PAST MEDICAL HISTORY Past Medical History:  Diagnosis Date   Arthritis    GERD (gastroesophageal reflux disease)    History of colon polyps    History of prostate cancer    Hyperlipidemia    Hypertension    Kidney cysts    bilateral.  Renal US   Mitral regurgitation 12/02/2020   Past Surgical History:  Procedure Laterality Date   BIOPSY  05/05/2021   Procedure: BIOPSY;  Surgeon: Thornton Park, MD;  Location: [facility];  Service: Gastroenterology;;   COLONOSCOPY     CYSTOSCOPY  02/12/99   biopsy   ESOPHAGEAL BANDING N/A 08/04/2012   Procedure: ESOPHAGEAL BANDING;  Surgeon: Inda Castle, MD;  Location: [facility];  Service: Endoscopy;  Laterality: N/A;   ESOPHAGOGASTRODUODENOSCOPY N/A 08/04/2012   Procedure: ESOPHAGOGASTRODUODENOSCOPY (EGD);  Surgeon: Inda Castle, MD;  Location: [facility];  Service: Endoscopy;  Laterality: N/A;   ESOPHAGOGASTRODUODENOSCOPY (EGD) WITH PROPOFOL N/A 05/05/2021  Procedure: ESOPHAGOGASTRODUODENOSCOPY (EGD) WITH PROPOFOL;  Surgeon: Thornton Park, MD;  Location: [facility];  Service: Gastroenterology;  Laterality: N/A;   INGUINAL HERNIA REPAIR  02/1999   Dr. Reece Agar   POLYPECTOMY  05/05/2021   Procedure: POLYPECTOMY;  Surgeon: Thornton Park, MD;  Location: [facility] ENDOSCOPY;  Service: Gastroenterology;;   PROSTATECTOMY  02/1999    FAMILY HISTORY Family History  Problem Relation Age of Onset   Bone cancer Sister    Stomach cancer Brother    Stomach cancer Brother    Lung cancer Sister     Colon cancer Sister    Stroke Mother    Colon cancer Other        nephew    SOCIAL HISTORY Social History   Tobacco Use   Smoking status: Former    Types: Cigarettes    Quit date: 08/26/2001    Years since quitting: 20.4   Smokeless tobacco: Former    Types: Chew   Tobacco comments:    quit about 10 years or more  Vaping Use   Vaping Use: Never used  Substance Use Topics   Alcohol use: Yes   Drug use: No         OPHTHALMIC EXAM:  Base Eye Exam     Visual Acuity (Snellen - Linear)       Right Left   Dist ph [place] 20/80 20/50         Tonometry (Tonopen, 11:12 AM)       Right Left   Pressure 7 8         Pupils       APD   Right None   Left None         Extraocular Movement       Right Left    Ortho Ortho    -- -- --  --  --  -- -- --   -- -- --  --  --  -- -- --           Neuro/Psych     Oriented x3: Yes   Mood/Affect: Normal         Dilation     Both eyes: 1.0% Mydriacyl, 2.5% Phenylephrine @ 11:13 AM           Slit Lamp and Fundus Exam     External Exam       Right Left   External Normal Normal         Slit Lamp Exam       Right Left   Lids/Lashes Normal Normal   Conjunctiva/Sclera White and quiet White and quiet   Cornea Clear Clear   Anterior Chamber Deep and quiet Deep and quiet   Iris Round and reactive Round and reactive   Anterior Vitreous Normal Normal         Fundus Exam       Right Left   Posterior Vitreous Posterior vitreous detachment Posterior vitreous detachment   Disc Normal Normal   C/D Ratio 0.45 0.7   Macula Normal Normal   Vessels Small area superior to the nerve now completely resolved resolved Normal   Periphery Central white, RAM, fibrotic resolved, no residual hemorrhage, and clearance of ram with old fibrotic changes, not pathologic at this time, Fibrotic rim, not active along arteriole Normal            IMAGING AND PROCEDURES  Imaging and Procedures for  02/05/22  Color Fundus Photography Optos - OU - Both Eyes  Right Eye Progression has improved. Disc findings include normal observations. Macula : geographic atrophy. Periphery : hemorrhage.   Left Eye Progression has been stable. Disc findings include normal observations. Macula : geographic atrophy. Vessels : normal observations.   Notes OD resolved RAM superior to nerve now fibrotic area along the retinal arterioles  No pathology OS normal               ASSESSMENT/PLAN:  Hypertensive retinopathy, right eye Continue blood pressure control important  Retinal macroaneurysm of right eye Condition has resolved now.     ICD-10-CM   1. Retinal macroaneurysm of right eye  H35.011 Color Fundus Photography Optos - OU - Both Eyes    2. Retinal hemorrhage, right eye  H35.61 Color Fundus Photography Optos - OU - Both Eyes    3. Hypertensive retinopathy, right eye  H35.031       1.  Retinal artery macro aneurysm is completely resolved superior the optic nerve.  No ongoing pathologies OU.  2.  Patient understands critical points to continue blood pressure control to minimize the risk of recurrences  3.  Ophthalmic Meds Ordered this visit:  No orders of the defined types were placed in this encounter.      Return if symptoms worsen or fail to improve, for Dr. Clent Jacks as scheduled.  There are no Patient Instructions on file for this visit.   Explained the diagnoses, plan, and follow up with the patient and they expressed understanding.  Patient expressed understanding of the importance of proper follow up care.   Clent Demark Yoandri Congrove M.D. Diseases & Surgery of the Retina and Vitreous [facility] [place] 02/05/22     Abbreviations: M myopia (nearsighted); A astigmatism; H hyperopia (farsighted); P presbyopia; Mrx spectacle prescription;  CTL contact lenses; OD right eye; OS left eye; OU both eyes  XT exotropia; ET esotropia; PEK punctate epithelial  keratitis; PEE punctate epithelial erosions; DES dry eye syndrome; MGD meibomian gland dysfunction; ATs artificial tears; PFAT's preservative free artificial tears; [place] nuclear sclerotic cataract; PSC posterior subcapsular cataract; ERM epi-retinal membrane; PVD posterior vitreous detachment; RD retinal detachment; DM diabetes mellitus; DR diabetic retinopathy; NPDR non-proliferative diabetic retinopathy; PDR proliferative diabetic retinopathy; CSME clinically significant macular edema; DME diabetic macular edema; dbh dot blot hemorrhages; CWS cotton wool spot; POAG primary open angle glaucoma; C/D cup-to-disc ratio; HVF humphrey visual field; GVF goldmann visual field; OCT optical coherence tomography; IOP intraocular pressure; BRVO Branch retinal vein occlusion; CRVO central retinal vein occlusion; CRAO central retinal artery occlusion; BRAO branch retinal artery occlusion; RT retinal tear; SB scleral buckle; PPV pars plana vitrectomy; VH Vitreous hemorrhage; PRP panretinal laser photocoagulation; IVK intravitreal kenalog; VMT vitreomacular traction; MH Macular hole;  NVD neovascularization of the disc; NVE neovascularization elsewhere; AREDS age related eye disease study; ARMD age related macular degeneration; POAG primary open angle glaucoma; EBMD epithelial/anterior basement membrane dystrophy; ACIOL anterior chamber intraocular lens; IOL intraocular lens; PCIOL posterior chamber intraocular lens; Phaco/IOL phacoemulsification with intraocular lens placement; [place] photorefractive keratectomy; LASIK laser assisted in situ keratomileusis; HTN hypertension; DM diabetes mellitus; COPD chronic obstructive pulmonary disease

## 2022-02-05 NOTE — Assessment & Plan Note (Signed)
Condition has resolved now.

## 2022-02-16 NOTE — Progress Notes (Unsigned)
Cardiology Office Note   Date:  02/17/2022   ID:  Rick [name], DOB 06-May-1929, MRN [id number]  PCP:  Tonia Ghent, MD  Cardiologist:   Rick Carnes, MD   Pt presents for f/u of HTN and diastolic CHF      History of Present Illness: Rick [name] is a 88 y.o. male with a history of HTN, CKD.   In 2022 the pt was hospitalized for  severe HTN and pulmonary edema  Echo was doe showing LVEF normal  and severe  eccentric MR   He was diuresed with IV lasix     No LE edema prior to d/c   Given age, renal dysfunction the recomm was to treat medically, not pursue any further imaging   I last saw the pt in  clinic in March 2023  He says he feels OK   Denies CP  Breathing is OK   No palpitations    No PND       Current Meds  Medication Sig   acetaminophen (TYLENOL) 325 MG tablet Take 2 tablets (650 mg total) by mouth every 6 (six) hours as needed.   amLODipine (NORVASC) 10 MG tablet Take 10 mg by mouth daily.   Cholecalciferol (VITAMIN D3) 25 MCG (1000 UT) CAPS Take 1 capsule (1,000 Units total) by mouth daily.   feeding supplement (ENSURE ENLIVE / ENSURE PLUS) LIQD Take 237 mLs by mouth 2 (two) times daily between meals.   ferrous sulfate 325 (65 FE) MG tablet Take 1 tablet (325 mg total) by mouth daily with breakfast.   latanoprost (XALATAN) 0.005 % ophthalmic solution Place 1 drop into both eyes at bedtime.   levothyroxine (SYNTHROID) 50 MCG tablet Take 1 tablet (50 mcg total) by mouth daily. Take on empty stomach early in the morning.   losartan (COZAAR) 100 MG tablet Take 100 mg by mouth daily.   omeprazole (PRILOSEC OTC) 20 MG tablet Take 1 tablet (20 mg total) by mouth daily.   sodium bicarbonate 650 MG tablet Take 2 tablets (1,300 mg total) by mouth 2 (two) times daily.     Allergies:   Lisinopril and Nsaids   Past Medical History:  Diagnosis Date   Arthritis    GERD (gastroesophageal reflux disease)    History of colon polyps    History of prostate cancer     Hyperlipidemia    Hypertension    Kidney cysts    bilateral.  Renal US   Mitral regurgitation 12/02/2020    Past Surgical History:  Procedure Laterality Date   BIOPSY  05/05/2021   Procedure: BIOPSY;  Surgeon: Thornton Park, MD;  Location: [facility];  Service: Gastroenterology;;   COLONOSCOPY     CYSTOSCOPY  02/12/99   biopsy   ESOPHAGEAL BANDING N/A 08/04/2012   Procedure: ESOPHAGEAL BANDING;  Surgeon: Inda Castle, MD;  Location: [facility];  Service: Endoscopy;  Laterality: N/A;   ESOPHAGOGASTRODUODENOSCOPY N/A 08/04/2012   Procedure: ESOPHAGOGASTRODUODENOSCOPY (EGD);  Surgeon: Inda Castle, MD;  Location: [facility];  Service: Endoscopy;  Laterality: N/A;   ESOPHAGOGASTRODUODENOSCOPY (EGD) WITH PROPOFOL N/A 05/05/2021   Procedure: ESOPHAGOGASTRODUODENOSCOPY (EGD) WITH PROPOFOL;  Surgeon: Thornton Park, MD;  Location: [facility];  Service: Gastroenterology;  Laterality: N/A;   INGUINAL HERNIA REPAIR  02/1999   Dr. Reece Agar   POLYPECTOMY  05/05/2021   Procedure: POLYPECTOMY;  Surgeon: Thornton Park, MD;  Location: [facility] END[facility]COPY;  Service: Gastroenterology;;   PROSTATECTOMY  02/1999     Social History:  The patient  reports that he quit smoking about 20 years ago. His smoking use included cigarettes. He has quit using smokeless tobacco.  His smokeless tobacco use included chew. He reports current alcohol use. He reports that he does not use drugs.   Family History:  The patient's family history includes Bone cancer in his sister; Colon cancer in his sister and another family member; Lung cancer in his sister; Stomach cancer in his brother and brother; Stroke in his mother.    ROS:  Please see the history of present illness. All other systems are reviewed and  Negative to the above problem except as noted.    PHYSICAL EXAM: VS:  BP 116/68   Pulse 88   Ht 5' 7.5" (1.715 m)   Wt 134 lb (60.8 kg)   SpO2 90%   BMI 20.68 kg/m   O2 sat with walking decreased  to 83%   Got back up to 90% at rest  He denies SOB with walking  GEN: Thin 88 yo  in no acute distress  HEENT: normal  Neck: no JVD, carotid bruits Cardiac: RRR; Gr II/VI systolc murmur LSB.  Tr to 1+ LE edema  Respiratory:  clear to auscultation bilaterally, GI: soft, nontender, nondistended, + BS  No hepatomegaly  MS: no deformity Moving all extremities   Skin: warm and dry, no rash Neuro:  Strength and sensation are intact Psych: euthymic mood, full affect   EKG:  EKG is not ordered today.    Echo 12/01/20  1. Moderate to severe mitral regurgitation is present, likely severe. The jet is eccentric and anteriorly directed. There is likely pulmonary vein flow reversal in systole in the right inferior pulmonary vein. There is splay artifact present, indicating at least moderate severity. The jet appears to wrap around the LA, indicative of severe MR. RVSP elevated. There is no obvious flail segment, but suspect this is not well seen. Would recommend a TEE for characterization of the MR. The mitral valve is grossly normal. Moderate to severe mitral valve regurgitation. No evidence of mitral stenosis. 2. Left ventricular ejection fraction, by estimation, is 60 to 65%. The left ventricle has normal function. The left ventricle has no regional wall motion abnormalities. There is severe asymmetric left ventricular hypertrophy of the basal-septal segment. Indeterminate diastolic filling due to E-A fusion. 3. Right ventricular systolic function is normal. The right ventricular size is normal. There is mildly elevated pulmonary artery systolic pressure. The estimated right ventricular systolic pressure is 41.3 mmHg. 4. The aortic valve is tricuspid. Aortic valve regurgitation is mild to moderate. Mild aortic valve sclerosis is present, with no evidence of aortic valve stenosis. 5. The inferior vena cava is normal in size with greater than 50% respiratory variability, suggesting right atrial  pressure of 3 mmHg. 6. There is a large cystic structure located inferior to the liver, which could represent a cyst. Would recommend dedicated liver imaging. Lipid Panel    Component Value Date/Time   CHOL 101 08/19/2021 1600   TRIG 43 08/19/2021 1600   HDL 52 08/19/2021 1600   CHOLHDL 1.9 08/19/2021 1600   CHOLHDL 2 05/18/2020 1218   VLDL 14.6 05/18/2020 1218   LDLCALC 38 08/19/2021 1600      Wt Readings from Last 3 Encounters:  02/17/22 134 lb (60.8 kg)  11/27/21 142 lb (64.4 kg)  11/21/21 142 lb (64.4 kg)      ASSESSMENT AND PLAN:  1  Mitral regurgitation.   I have reviewed echo images  with patient and wife  MR is very eccentric, directed anteriorly    Probably mod t osevere    He is fairly comfortable  Denies SOB   Some mild edema   Will get BNPand BMET Follow   Not a candidate for intervention    2  Hypoxemia   Sats are low with walking    Will get labs   {Probably significant intrinsic lung dz   I do not think due to MR or Volume overload    Check labs   Alert G Duncan  Pt has no O2       3  HTN  BP is controlled  t  4 Hx diastolic CHF Pt with signficant MR   Also some LE edema   Check labs    With renal insuff probably wont get much drier.   Limit salt  F/U in the winter    Current medicines are reviewed at length with the patient today.  The patient does not have concerns regarding medicines.  Signed, Rick Carnes, MD  02/17/2022 3:46 PM    [address], [place], [place]  [address] Phone: [phone]; Fax: [phone]

## 2022-02-17 ENCOUNTER — Ambulatory Visit: Payer: Medicare PPO | Attending: Internal Medicine | Admitting: Internal Medicine

## 2022-02-17 ENCOUNTER — Encounter: Payer: Self-pay | Admitting: Internal Medicine

## 2022-02-17 VITALS — BP 116/68 | HR 88 | Ht 67.5 in | Wt 134.0 lb

## 2022-02-17 DIAGNOSIS — I5033 Acute on chronic diastolic (congestive) heart failure: Secondary | ICD-10-CM

## 2022-02-17 DIAGNOSIS — Z79899 Other long term (current) drug therapy: Secondary | ICD-10-CM

## 2022-02-17 DIAGNOSIS — E039 Hypothyroidism, unspecified: Secondary | ICD-10-CM | POA: Diagnosis not present

## 2022-02-17 DIAGNOSIS — I10 Essential (primary) hypertension: Secondary | ICD-10-CM

## 2022-02-17 NOTE — Patient Instructions (Signed)
Medication Instructions:   *If you need a refill on your cardiac medications before your next appointment, please call your pharmacy*   Lab Work: BMET AND PRO BNP   If you have labs (blood work) drawn today and your tests are completely normal, you will receive your results only by: [place] (if you have MyChart) OR A paper copy in the mail If you have any lab test that is abnormal or we need to change your treatment, we will call you to review the results.   Testing/Procedures:    Follow-Up: At [facility], you and your health needs are our priority.  As part of our continuing mission to provide you with exceptional heart care, we have created designated Provider Care Teams.  These Care Teams include your primary Cardiologist (physician) and Advanced Practice Providers (APPs -  Physician Assistants and Nurse Practitioners) who all work together to provide you with the care you need, when you need it.  We recommend signing up for the patient portal called "MyChart".  Sign up information is provided on this After Visit Summary.  MyChart is used to connect with patients for Virtual Visits (Telemedicine).  Patients are able to view lab/test results, encounter notes, upcoming appointments, etc.  Non-urgent messages can be sent to your provider as well.   To learn more about what you can do with MyChart, go to [email or web address].    Your next appointment:   4 month(s)  The format for your next appointment:   In Person  Provider: DR Nevin Bloodgood ROSS     Other Instructions REFERRAL BACK TO HIS PCP DR. Elsie Stain  [phone]    Important Information About [place]

## 2022-02-18 LAB — BASIC METABOLIC PANEL
BUN/Creatinine Ratio: 9 — ABNORMAL LOW (ref 10–24)
BUN: 22 mg/dL (ref 10–36)
CO2: 21 mmol/L (ref 20–29)
Calcium: 9 mg/dL (ref 8.6–10.2)
Chloride: 95 mmol/L — ABNORMAL LOW (ref 96–106)
Creatinine, Ser: 2.56 mg/dL — ABNORMAL HIGH (ref 0.76–1.27)
Glucose: 89 mg/dL (ref 70–99)
Potassium: 3.9 mmol/L (ref 3.5–5.2)
Sodium: 132 mmol/L — ABNORMAL LOW (ref 134–144)
eGFR: 23 mL/min/{1.73_m2} — ABNORMAL LOW (ref >59)

## 2022-02-18 LAB — PRO B NATRIURETIC PEPTIDE: NT-Pro BNP: 4983 pg/mL — ABNORMAL HIGH (ref 0–486)

## 2022-02-23 ENCOUNTER — Telehealth: Payer: Self-pay | Admitting: Family Medicine

## 2022-02-23 NOTE — Telephone Encounter (Signed)
Please check with patient about getting an OV when possible to recheck his O2 sats at rest and with walking.  Thanks.

## 2022-02-24 DIAGNOSIS — I129 Hypertensive chronic kidney disease with stage 1 through stage 4 chronic kidney disease, or unspecified chronic kidney disease: Secondary | ICD-10-CM | POA: Diagnosis not present

## 2022-02-24 DIAGNOSIS — Q613 Polycystic kidney, unspecified: Secondary | ICD-10-CM | POA: Diagnosis not present

## 2022-02-24 DIAGNOSIS — D631 Anemia in chronic kidney disease: Secondary | ICD-10-CM | POA: Diagnosis not present

## 2022-02-24 DIAGNOSIS — N184 Chronic kidney disease, stage 4 (severe): Secondary | ICD-10-CM | POA: Diagnosis not present

## 2022-02-24 DIAGNOSIS — N2581 Secondary hyperparathyroidism of renal origin: Secondary | ICD-10-CM | POA: Diagnosis not present

## 2022-02-24 DIAGNOSIS — E871 Hypo-osmolality and hyponatremia: Secondary | ICD-10-CM | POA: Diagnosis not present

## 2022-02-24 DIAGNOSIS — R609 Edema, unspecified: Secondary | ICD-10-CM | POA: Diagnosis not present

## 2022-02-24 DIAGNOSIS — N281 Cyst of kidney, acquired: Secondary | ICD-10-CM | POA: Diagnosis not present

## 2022-02-24 NOTE — Telephone Encounter (Signed)
LMTCB to schedule appt

## 2022-02-25 NOTE — Telephone Encounter (Signed)
Patient has been scheduled for 02/27/22 at 12:00 pm

## 2022-02-27 ENCOUNTER — Encounter: Payer: Self-pay | Admitting: Family Medicine

## 2022-02-27 ENCOUNTER — Other Ambulatory Visit: Payer: Medicare PPO

## 2022-02-27 ENCOUNTER — Ambulatory Visit (INDEPENDENT_AMBULATORY_CARE_PROVIDER_SITE_OTHER)
Admission: RE | Admit: 2022-02-27 | Discharge: 2022-02-27 | Disposition: A | Discharge status: Home / Self Care | Payer: Medicare PPO | Source: Ambulatory Visit | Attending: Family Medicine | Admitting: Family Medicine

## 2022-02-27 ENCOUNTER — Ambulatory Visit: Payer: Medicare PPO | Admitting: Family Medicine

## 2022-02-27 VITALS — BP 118/80 | HR 86 | Temp 97.6°F | Ht 67.5 in | Wt 132.0 lb

## 2022-02-27 DIAGNOSIS — N189 Chronic kidney disease, unspecified: Secondary | ICD-10-CM

## 2022-02-27 DIAGNOSIS — R0902 Hypoxemia: Secondary | ICD-10-CM | POA: Diagnosis not present

## 2022-02-27 DIAGNOSIS — I509 Heart failure, unspecified: Secondary | ICD-10-CM

## 2022-02-27 LAB — URINALYSIS, ROUTINE W REFLEX MICROSCOPIC
Bilirubin Urine: NEGATIVE
Ketones, ur: NEGATIVE
Nitrite: NEGATIVE
Specific Gravity, Urine: 1.01 (ref 1.000–1.030)
Total Protein, Urine: 30 — AB
Urine Glucose: NEGATIVE
Urobilinogen, UA: 0.2 (ref 0.0–1.0)
pH: 8 (ref 5.0–8.0)

## 2022-02-27 NOTE — Progress Notes (Signed)
Follow-up for hypoxia noted at outside clinic.  History of CKD at baseline.  97% at rest on RA.   86% walking on RA.  97% on O2 2L  He didn't fell SOB with walking and was able to walk in the yard at home yesterday w/o getting SOB.    Discussed checking urinalysis given history of CKD.  He has occasional but not persistent discomfort with urination.  Meds, vitals, and allergies reviewed.   ROS: Per HPI unless specifically indicated in ROS section   Nad Ncat Neck supple, no LA Rrr Ctab Abd soft not ttp No BLE edema.   30 minutes were devoted to patient care in this encounter (this includes time spent reviewing the patient's file/history, interviewing and examining the patient, counseling/reviewing plan with patient).

## 2022-02-27 NOTE — Patient Instructions (Signed)
Go to the lab on the way out.   If you have mychart we'll likely use that to update you.    Let me update Dr. Harrington Challenger and we'll go from there.  If you are getting more short of breath, then I need to you get rechecked.   Keep using your walker and take a rest with walking.  Take care.  Glad to see you.

## 2022-03-03 ENCOUNTER — Telehealth: Payer: Self-pay | Admitting: Family Medicine

## 2022-03-03 DIAGNOSIS — I509 Heart failure, unspecified: Secondary | ICD-10-CM

## 2022-03-03 NOTE — Telephone Encounter (Signed)
Order placed. Community message sent to make aware of order.

## 2022-03-03 NOTE — Telephone Encounter (Signed)
Please use the most recent office visit note and send a DME order for home O2 via nasal cannula at 2 L/min.  Dx hypoxia.  Thanks.

## 2022-03-03 NOTE — Addendum Note (Signed)
Addended by: Francella Solian on: 03/03/2022 03:35 PM   Modules accepted: Orders

## 2022-03-04 DIAGNOSIS — R3914 Feeling of incomplete bladder emptying: Secondary | ICD-10-CM | POA: Diagnosis not present

## 2022-03-04 DIAGNOSIS — N390 Urinary tract infection, site not specified: Secondary | ICD-10-CM | POA: Diagnosis not present

## 2022-03-04 DIAGNOSIS — R8271 Bacteriuria: Secondary | ICD-10-CM | POA: Diagnosis not present

## 2022-04-08 DIAGNOSIS — R3914 Feeling of incomplete bladder emptying: Secondary | ICD-10-CM | POA: Diagnosis not present

## 2022-04-08 DIAGNOSIS — N390 Urinary tract infection, site not specified: Secondary | ICD-10-CM | POA: Diagnosis not present

## 2022-04-08 DIAGNOSIS — R351 Nocturia: Secondary | ICD-10-CM | POA: Diagnosis not present

## 2022-04-08 DIAGNOSIS — Z8546 Personal history of malignant neoplasm of prostate: Secondary | ICD-10-CM | POA: Diagnosis not present

## 2022-05-26 DIAGNOSIS — E871 Hypo-osmolality and hyponatremia: Secondary | ICD-10-CM | POA: Diagnosis not present

## 2022-05-26 DIAGNOSIS — N2581 Secondary hyperparathyroidism of renal origin: Secondary | ICD-10-CM | POA: Diagnosis not present

## 2022-05-26 DIAGNOSIS — R609 Edema, unspecified: Secondary | ICD-10-CM | POA: Diagnosis not present

## 2022-05-26 DIAGNOSIS — N390 Urinary tract infection, site not specified: Secondary | ICD-10-CM | POA: Diagnosis not present

## 2022-05-26 DIAGNOSIS — D631 Anemia in chronic kidney disease: Secondary | ICD-10-CM | POA: Diagnosis not present

## 2022-05-26 DIAGNOSIS — I129 Hypertensive chronic kidney disease with stage 1 through stage 4 chronic kidney disease, or unspecified chronic kidney disease: Secondary | ICD-10-CM | POA: Diagnosis not present

## 2022-05-26 DIAGNOSIS — N200 Calculus of kidney: Secondary | ICD-10-CM | POA: Diagnosis not present

## 2022-05-26 DIAGNOSIS — N184 Chronic kidney disease, stage 4 (severe): Secondary | ICD-10-CM | POA: Diagnosis not present

## 2022-05-30 DIAGNOSIS — N390 Urinary tract infection, site not specified: Secondary | ICD-10-CM | POA: Diagnosis not present

## 2022-05-30 DIAGNOSIS — N184 Chronic kidney disease, stage 4 (severe): Secondary | ICD-10-CM | POA: Diagnosis not present

## 2022-05-31 ENCOUNTER — Encounter (HOSPITAL_COMMUNITY): Payer: Self-pay

## 2022-05-31 ENCOUNTER — Other Ambulatory Visit: Payer: Self-pay

## 2022-05-31 ENCOUNTER — Inpatient Hospital Stay (HOSPITAL_COMMUNITY)
Admission: EM | Admit: 2022-05-31 | Discharge: 2022-06-09 | DRG: 682 | Disposition: A | Discharge status: Home / Self Care | Payer: Medicare PPO | Attending: Family Medicine | Admitting: Family Medicine

## 2022-05-31 ENCOUNTER — Emergency Department (HOSPITAL_COMMUNITY): Payer: Medicare PPO

## 2022-05-31 ENCOUNTER — Encounter: Payer: Self-pay | Admitting: Nephrology

## 2022-05-31 DIAGNOSIS — R600 Localized edema: Secondary | ICD-10-CM | POA: Diagnosis not present

## 2022-05-31 DIAGNOSIS — E039 Hypothyroidism, unspecified: Secondary | ICD-10-CM | POA: Diagnosis not present

## 2022-05-31 DIAGNOSIS — E440 Moderate protein-calorie malnutrition: Secondary | ICD-10-CM | POA: Diagnosis present

## 2022-05-31 DIAGNOSIS — N400 Enlarged prostate without lower urinary tract symptoms: Secondary | ICD-10-CM | POA: Insufficient documentation

## 2022-05-31 DIAGNOSIS — Z7189 Other specified counseling: Secondary | ICD-10-CM

## 2022-05-31 DIAGNOSIS — R7989 Other specified abnormal findings of blood chemistry: Secondary | ICD-10-CM | POA: Diagnosis not present

## 2022-05-31 DIAGNOSIS — E871 Hypo-osmolality and hyponatremia: Secondary | ICD-10-CM | POA: Diagnosis not present

## 2022-05-31 DIAGNOSIS — E872 Acidosis, unspecified: Secondary | ICD-10-CM | POA: Diagnosis not present

## 2022-05-31 DIAGNOSIS — D638 Anemia in other chronic diseases classified elsewhere: Secondary | ICD-10-CM | POA: Diagnosis not present

## 2022-05-31 DIAGNOSIS — Z6820 Body mass index (BMI) 20.0-20.9, adult: Secondary | ICD-10-CM

## 2022-05-31 DIAGNOSIS — K219 Gastro-esophageal reflux disease without esophagitis: Secondary | ICD-10-CM | POA: Diagnosis present

## 2022-05-31 DIAGNOSIS — E875 Hyperkalemia: Secondary | ICD-10-CM | POA: Diagnosis present

## 2022-05-31 DIAGNOSIS — I080 Rheumatic disorders of both mitral and aortic valves: Secondary | ICD-10-CM | POA: Diagnosis present

## 2022-05-31 DIAGNOSIS — D649 Anemia, unspecified: Secondary | ICD-10-CM | POA: Diagnosis not present

## 2022-05-31 DIAGNOSIS — D509 Iron deficiency anemia, unspecified: Secondary | ICD-10-CM | POA: Diagnosis not present

## 2022-05-31 DIAGNOSIS — Z515 Encounter for palliative care: Secondary | ICD-10-CM | POA: Diagnosis not present

## 2022-05-31 DIAGNOSIS — Z66 Do not resuscitate: Secondary | ICD-10-CM | POA: Diagnosis not present

## 2022-05-31 DIAGNOSIS — I130 Hypertensive heart and chronic kidney disease with heart failure and stage 1 through stage 4 chronic kidney disease, or unspecified chronic kidney disease: Secondary | ICD-10-CM | POA: Diagnosis present

## 2022-05-31 DIAGNOSIS — I4510 Unspecified right bundle-branch block: Secondary | ICD-10-CM | POA: Diagnosis present

## 2022-05-31 DIAGNOSIS — I5021 Acute systolic (congestive) heart failure: Secondary | ICD-10-CM | POA: Diagnosis not present

## 2022-05-31 DIAGNOSIS — I129 Hypertensive chronic kidney disease with stage 1 through stage 4 chronic kidney disease, or unspecified chronic kidney disease: Secondary | ICD-10-CM | POA: Diagnosis not present

## 2022-05-31 DIAGNOSIS — Q613 Polycystic kidney, unspecified: Secondary | ICD-10-CM

## 2022-05-31 DIAGNOSIS — N179 Acute kidney failure, unspecified: Secondary | ICD-10-CM | POA: Diagnosis not present

## 2022-05-31 DIAGNOSIS — I3139 Other pericardial effusion (noninflammatory): Secondary | ICD-10-CM | POA: Diagnosis not present

## 2022-05-31 DIAGNOSIS — I10 Essential (primary) hypertension: Secondary | ICD-10-CM | POA: Diagnosis present

## 2022-05-31 DIAGNOSIS — Z9079 Acquired absence of other genital organ(s): Secondary | ICD-10-CM

## 2022-05-31 DIAGNOSIS — N184 Chronic kidney disease, stage 4 (severe): Secondary | ICD-10-CM | POA: Diagnosis not present

## 2022-05-31 DIAGNOSIS — E785 Hyperlipidemia, unspecified: Secondary | ICD-10-CM | POA: Diagnosis present

## 2022-05-31 DIAGNOSIS — I5041 Acute combined systolic (congestive) and diastolic (congestive) heart failure: Secondary | ICD-10-CM

## 2022-05-31 DIAGNOSIS — Z888 Allergy status to other drugs, medicaments and biological substances status: Secondary | ICD-10-CM

## 2022-05-31 DIAGNOSIS — N1832 Chronic kidney disease, stage 3b: Secondary | ICD-10-CM | POA: Diagnosis not present

## 2022-05-31 DIAGNOSIS — E8809 Other disorders of plasma-protein metabolism, not elsewhere classified: Secondary | ICD-10-CM | POA: Diagnosis not present

## 2022-05-31 DIAGNOSIS — N281 Cyst of kidney, acquired: Secondary | ICD-10-CM | POA: Diagnosis not present

## 2022-05-31 DIAGNOSIS — I340 Nonrheumatic mitral (valve) insufficiency: Secondary | ICD-10-CM | POA: Diagnosis not present

## 2022-05-31 DIAGNOSIS — N189 Chronic kidney disease, unspecified: Secondary | ICD-10-CM | POA: Diagnosis not present

## 2022-05-31 DIAGNOSIS — E46 Unspecified protein-calorie malnutrition: Secondary | ICD-10-CM | POA: Insufficient documentation

## 2022-05-31 DIAGNOSIS — Z87442 Personal history of urinary calculi: Secondary | ICD-10-CM

## 2022-05-31 DIAGNOSIS — T502X5A Adverse effect of carbonic-anhydrase inhibitors, benzothiadiazides and other diuretics, initial encounter: Secondary | ICD-10-CM | POA: Diagnosis present

## 2022-05-31 DIAGNOSIS — Z886 Allergy status to analgesic agent status: Secondary | ICD-10-CM

## 2022-05-31 DIAGNOSIS — I5031 Acute diastolic (congestive) heart failure: Secondary | ICD-10-CM | POA: Diagnosis not present

## 2022-05-31 DIAGNOSIS — R609 Edema, unspecified: Secondary | ICD-10-CM

## 2022-05-31 DIAGNOSIS — E876 Hypokalemia: Secondary | ICD-10-CM | POA: Diagnosis present

## 2022-05-31 DIAGNOSIS — Z79890 Hormone replacement therapy (postmenopausal): Secondary | ICD-10-CM

## 2022-05-31 DIAGNOSIS — Z79899 Other long term (current) drug therapy: Secondary | ICD-10-CM | POA: Diagnosis not present

## 2022-05-31 DIAGNOSIS — Z8546 Personal history of malignant neoplasm of prostate: Secondary | ICD-10-CM

## 2022-05-31 LAB — COMPREHENSIVE METABOLIC PANEL
ALT: 8 U/L (ref 0–44)
AST: 19 U/L (ref 15–41)
Albumin: 2.4 g/dL — ABNORMAL LOW (ref 3.5–5.0)
Alkaline Phosphatase: 73 U/L (ref 38–126)
Anion gap: 13 (ref 5–15)
BUN: 16 mg/dL (ref 8–23)
CO2: 26 mmol/L (ref 22–32)
Calcium: 8.1 mg/dL — ABNORMAL LOW (ref 8.9–10.3)
Chloride: 89 mmol/L — ABNORMAL LOW (ref 98–111)
Creatinine, Ser: 3.32 mg/dL — ABNORMAL HIGH (ref 0.61–1.24)
GFR, Estimated: 17 mL/min — ABNORMAL LOW (ref >60)
Glucose, Bld: 91 mg/dL (ref 70–99)
Potassium: 3 mmol/L — ABNORMAL LOW (ref 3.5–5.1)
Sodium: 128 mmol/L — ABNORMAL LOW (ref 135–145)
Total Bilirubin: 1 mg/dL (ref 0.3–1.2)
Total Protein: 7.9 g/dL (ref 6.5–8.1)

## 2022-05-31 LAB — CBC WITH DIFFERENTIAL/PLATELET
Abs Immature Granulocytes: 0.03 10*3/uL (ref 0.00–0.07)
Basophils Absolute: 0 10*3/uL (ref 0.0–0.1)
Basophils Relative: 0 %
Eosinophils Absolute: 0.2 10*3/uL (ref 0.0–0.5)
Eosinophils Relative: 2 %
HCT: 29.9 % — ABNORMAL LOW (ref 39.0–52.0)
Hemoglobin: 9.6 g/dL — ABNORMAL LOW (ref 13.0–17.0)
Immature Granulocytes: 0 %
Lymphocytes Relative: 6 %
Lymphs Abs: 0.6 10*3/uL — ABNORMAL LOW (ref 0.7–4.0)
MCH: 28.2 pg (ref 26.0–34.0)
MCHC: 32.1 g/dL (ref 30.0–36.0)
MCV: 87.7 fL (ref 80.0–100.0)
Monocytes Absolute: 0.5 10*3/uL (ref 0.1–1.0)
Monocytes Relative: 5 %
Neutro Abs: 8.2 10*3/uL — ABNORMAL HIGH (ref 1.7–7.7)
Neutrophils Relative %: 87 %
Platelets: 357 10*3/uL (ref 150–400)
RBC: 3.41 MIL/uL — ABNORMAL LOW (ref 4.22–5.81)
RDW: 15.7 % — ABNORMAL HIGH (ref 11.5–15.5)
WBC: 9.4 10*3/uL (ref 4.0–10.5)
nRBC: 0 % (ref 0.0–0.2)

## 2022-05-31 LAB — BRAIN NATRIURETIC PEPTIDE: B Natriuretic Peptide: 1180.5 pg/mL — ABNORMAL HIGH (ref 0.0–100.0)

## 2022-05-31 MED ORDER — POTASSIUM CHLORIDE 10 MEQ/100ML IV SOLN
10.0000 meq | INTRAVENOUS | Status: AC
  Administered 2022-05-31 – 2022-06-01 (×3): 10 meq via INTRAVENOUS
  Filled 2022-05-31 (×3): qty 100

## 2022-05-31 MED ORDER — POTASSIUM CHLORIDE CRYS ER 20 MEQ PO TBCR
40.0000 meq | EXTENDED_RELEASE_TABLET | Freq: Once | ORAL | Status: AC
  Administered 2022-05-31: 40 meq via ORAL
  Filled 2022-05-31: qty 2

## 2022-05-31 MED ORDER — FUROSEMIDE 10 MG/ML IJ SOLN
80.0000 mg | Freq: Once | INTRAMUSCULAR | Status: AC
  Administered 2022-05-31: 80 mg via INTRAVENOUS
  Filled 2022-05-31: qty 8

## 2022-05-31 NOTE — Progress Notes (Signed)
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 Patient seen by me at the office on 05/26/22 for routine CKD 4 care. Found to have an elevated Cr, up to 3.3 (up from 2.48 in Sept) but most importantly, found to have a sodium of 125. Given that he was slightly hypervolemic, had advised him to restart lasix and his labs from 12/22 revealed worsening sodium of 124, Cr 3.17 therefore advised patient's wife after a phone call this AM to proceed to the ER for further evaluation which they agreed to.  If nephrology is needed, please contact on-call nephrology.  Gean Quint, MD [facility]

## 2022-05-31 NOTE — ED Provider Notes (Signed)
[facility] EMERGENCY DEPARTMENT Provider Note   CSN: [id number] Arrival date & time: 05/31/22  1013     History  Chief Complaint  Patient presents with   Abnormal Lab    Rick [name] is a 88 y.o. male.  Past medical history of CKD stage IV secondary to polycystic kidney disease, hypertension, Hyperlipidemia, GERD, hypothyroidism, prostate cancer s/p prostatectomy, HFpEF EF 60 to 65% June 2022.  Here today with concerns for hyponatremia.  He has been seen in clinic by his nephrologist Dr. Candiss Norse.  Over the past couple of weeks he has had signs of hypervolemia and dropping sodium.  His creatinine has been uptrending from September of this year, sodium downtrending.  On 12/18 he was found to have a sodium of 125, nephrology advised him to restart his Lasix.  He has been taking his Lasix but then on 12/22 yesterday his sodium was 124 and creatinine remained elevated.  Nephrology advised him to come to the emergency department for further evaluation here today.  He has felt generally weak over the past 1 to 2 weeks, has had some episodes of diarrhea, but otherwise feeling fine.  Not having any chest pain or shortness of breath.  Does have the leg swelling but it is the same both sides.  Denies any fevers, chills, night sweats, cough, abdominal pain. HPI     Home Medications Prior to Admission medications   Medication Sig Start Date End Date Taking? Authorizing Provider  acetaminophen (TYLENOL) 325 MG tablet Take 2 tablets (650 mg total) by mouth every 6 (six) hours as needed. 11/01/21   Jill Alexanders, PA-C  amLODipine (NORVASC) 10 MG tablet Take 10 mg by mouth daily. 03/29/21   [provider]  Cholecalciferol (VITAMIN D3) 25 MCG (1000 UT) CAPS Take 1 capsule (1,000 Units total) by mouth daily. 01/01/22   Tonia Ghent, MD  feeding supplement (ENSURE ENLIVE / ENSURE PLUS) LIQD Take 237 mLs by mouth 2 (two) times daily between meals. 11/01/21   Jill Alexanders, PA-C  ferrous sulfate 325 (65 FE) MG tablet Take 1 tablet (325 mg total) by mouth daily with breakfast. 11/21/21 11/21/22  Tonia Ghent, MD  latanoprost (XALATAN) 0.005 % ophthalmic solution Place 1 drop into both eyes at bedtime.    [provider]  levothyroxine (SYNTHROID) 50 MCG tablet Take 1 tablet (50 mcg total) by mouth daily. Take on empty stomach early in the morning. 12/29/21   Tonia Ghent, MD  losartan (COZAAR) 100 MG tablet Take 100 mg by mouth daily. 07/29/21   [provider]  omeprazole (PRILOSEC OTC) 20 MG tablet Take 1 tablet (20 mg total) by mouth daily. 12/07/20   Tonia Ghent, MD  sodium bicarbonate 650 MG tablet Take 2 tablets (1,300 mg total) by mouth 2 (two) times daily. 11/01/21   Jill Alexanders, PA-C      Allergies    Lisinopril and Nsaids    Review of Systems   Review of Systems  Physical Exam Updated Vital Signs BP (!) 159/101   Pulse 73   Temp (!) 97.3 F (36.3 C) (Oral)   Resp 17   SpO2 99%  Physical Exam Vitals and nursing note reviewed.  Constitutional:      General: He is not in acute distress.    Appearance: He is well-developed. He is not ill-appearing or diaphoretic.  HENT:     Head: Normocephalic and atraumatic.     Right Ear: External  ear normal.     Left Ear: External ear normal.     Nose: Nose normal.     Mouth/Throat:     Mouth: Mucous membranes are moist.     Pharynx: Oropharynx is clear.  Eyes:     General: No scleral icterus.    Conjunctiva/sclera: Conjunctivae normal.  Cardiovascular:     Rate and Rhythm: Normal rate and regular rhythm.     Heart sounds: No murmur heard. Pulmonary:     Effort: Pulmonary effort is normal. No respiratory distress.     Breath sounds: Normal breath sounds. No stridor. No wheezing, rhonchi or rales.  Abdominal:     Palpations: Abdomen is soft.     Tenderness: There is no abdominal tenderness. There is no guarding or rebound.  Musculoskeletal:         General: No swelling.     Cervical back: Normal range of motion and neck supple. No tenderness.     Right lower leg: Edema present.     Left lower leg: Edema present.     Comments: Bilateral 2+ pitting edema to the proximal third tibia  Skin:    General: Skin is warm and dry.     Capillary Refill: Capillary refill takes less than 2 seconds.  Neurological:     General: No focal deficit present.     Mental Status: He is alert and oriented to person, place, and time.     Cranial Nerves: No cranial nerve deficit.     Sensory: No sensory deficit.     Motor: No weakness.  Psychiatric:        Mood and Affect: Mood normal.     ED Results / Procedures / Treatments   Labs (all labs ordered are listed, but only abnormal results are displayed) Labs Reviewed  COMPREHENSIVE METABOLIC PANEL - Abnormal; Notable for the following components:      Result Value   Sodium 128 (*)    Potassium 3.0 (*)    Chloride 89 (*)    Creatinine, Ser 3.32 (*)    Calcium 8.1 (*)    Albumin 2.4 (*)    GFR, Estimated 17 (*)    All other components within normal limits  CBC WITH DIFFERENTIAL/PLATELET - Abnormal; Notable for the following components:   RBC 3.41 (*)    Hemoglobin 9.6 (*)    HCT 29.9 (*)    RDW 15.7 (*)    Neutro Abs 8.2 (*)    Lymphs Abs 0.6 (*)    All other components within normal limits  BRAIN NATRIURETIC PEPTIDE - Abnormal; Notable for the following components:   B Natriuretic Peptide 1,180.5 (*)    All other components within normal limits    EKG EKG Interpretation  Date/Time:  Saturday May 31 2022 22:37:52 EST Ventricular Rate:  72 PR Interval:  331 QRS Duration: 166 QT Interval:  445 QTC Calculation: 487 R Axis:   -81 Text Interpretation: Sinus rhythm Atrial premature complex Prolonged PR interval Consider left atrial enlargement Right bundle branch block Confirmed by Sherwood Gambler [phone]) on 05/31/2022 10:42:11 PM  Radiology DG Chest 2 View  Result Date:  05/31/2022 CLINICAL DATA:  Evaluation for volume overload. Hyponatremia and peripheral edema. EXAM: CHEST - 2 VIEW COMPARISON:  02/27/2022. FINDINGS: The heart is mildly enlarged and the mediastinal contour is within normal limits. The pulmonary vasculature is normal in appearance. Hyperinflation of the lungs is noted. Mild atelectasis or scarring is present at the left lung base. No effusion or  pneumothorax. No acute osseous abnormality. IMPRESSION: 1. Mild cardiomegaly. 2. Mild atelectasis or scarring at the left lung base. Electronically Signed   By: Brett Fairy M.D.   On: 05/31/2022 22:01    Procedures Procedures    Medications Ordered in ED Medications  furosemide (LASIX) injection 80 mg (has no administration in time range)  potassium chloride 10 mEq in 100 mL IVPB (has no administration in time range)  potassium chloride SA (KLOR-CON M) CR tablet 40 mEq (has no administration in time range)    ED Course/ Medical Decision Making/ A&P                           Medical Decision Making Problems Addressed: Hypokalemia: acute illness or injury that poses a threat to life or bodily functions Hyponatremia: acute illness or injury that poses a threat to life or bodily functions Stage 4 chronic kidney disease ([place]): acute illness or injury that poses a threat to life or bodily functions  Amount and/or Complexity of Data Reviewed Independent Historian: spouse Labs: ordered. Decision-making details documented in ED Course. Radiology: ordered and independent interpretation performed. Decision-making details documented in ED Course. ECG/medicine tests: ordered and independent interpretation performed. Decision-making details documented in ED Course.  Risk Prescription drug management. Decision regarding hospitalization.   This is a 88 y.o. male who presents to the emergency department with hyponatremia and volume overload. History is obtained from patient, spouse, daughter. Medical  history reviewed in EMR.  Past medical/surgical history that increases complexity of ED encounter:  CKD stage IV secondary to polycystic kidney disease, hypertension, Hyperlipidemia, GERD, hypothyroidism, prostate cancer s/p prostatectomy, HFpEF EF 60 to 65% June 2022  Initial Assessment: Patient overall well-appearing with normal vital signs.  Not requiring any oxygen support or having tachypnea, but does appear to be volume overloaded.  Told to come to the emergency department for hyponatremia.  They have been managing this in the outpatient setting, restarting his Lasix, but he continues to be hyponatremic and volume overloaded.  Etiology of this concerning for CHF exacerbation, CKD, cardiorenal syndrome, SIADH   Initial Plan: Laboratory evaluation with CBC, CMP, BNP Imaging evaluation with chest x-ray ECG Consult nephrology  Interpretation of Diagnostics: I personally reviewed the labs, chest x-ray, ECG and my interpretation is as follows: He has hyponatremia, at 128.  This is an improvement over the past couple of days at 125 and 124.  However he is also hypokalemic at 3.  He has an elevated creatinine at 3.3, slightly up from the past couple of days at around 3 or 3.1.  Hemoglobin is stable but anemic.  BNP significantly elevated to 1100.  Chest x-ray shows no signs of volume overload.  ECG is similar compared to prior, no signs of ST segment changes or significant electrolyte abnormalities.  Discussed with nephrology, ultimately determining that he probably needs more diuresis.  Recommending 80 mg IV Lasix here in the ED, and then up again on the floor.  Will continue with potassium replacement, and admit to hospitalist medicine for further diuresis, electrolyte management, and nephrology consult.  MDM generated using voice dictation software and may contain dictation errors. Please contact me for any clarification or with any questions.          Final Clinical Impression(s) / ED  Diagnoses Final diagnoses:  Hyponatremia  Hypokalemia  Stage 4 chronic kidney disease ([place])  Peripheral edema    Rx / DC Orders ED Discharge Orders  None         Phyllis Ginger, MD 05/31/22 8875    Sherwood Gambler, MD 06/01/22 [phone]

## 2022-05-31 NOTE — ED Notes (Signed)
Pt placed on cardiac monitor 

## 2022-05-31 NOTE — H&P (Incomplete)
History and Physical    Patient: Rick [name] GNF:[id number] DOB: 01-25-1929 DOA: 05/31/2022 DOS: the patient was seen and examined on 06/01/2022 PCP: Tonia Ghent, MD  Patient coming from: Home  Chief Complaint:  Chief Complaint  Patient presents with   Abnormal Lab   HPI: Rick [name] is a 89 y.o. male with medical history significant of CKD stage IV secondary to polycystic kidney disease, hypertension, hyperlipidemia, hypothyroidism, GERD, prostate cancer status post prostatectomy who presented to the emergency department after being asked to go to the ED by his nephrologist (Dr. Candiss Norse).  Patient was unable to provide details regarding why he came to the ED other than that his kidney numbers were going up and was asked to go to the ED for further treatment.  Rest of the history was obtained from ED physician and ED medical record.  Per report, patient has had hypervolemia and decrease in sodium levels over the last couple of weeks, creatinine has been trending up since September, 2023 and sodium was downtrending.  Sodium level was 125 on 12/18 and he was advised to restart his Lasix by his nephrologist and he has been compliant with this, but sodium level decreased to 124 on 12/22 and creatinine continued to stay elevated.  He was asked to go to the emergency room for further evaluation.  Patient complained of 1 to 2 weeks of generalized weakness and some episodes of diarrhea, but denies any other symptoms.  ED Course:  In the emergency department, temperature was 97.3 F, respiratory rate 16/min, pulse was 73 bpm, BP was 142/89 and O2 sat was 93% on room air.  Workup in the ED showed normocytic anemia, sodium 128, potassium 3.0, chloride 89, bicarb 26, glucose 91, BUN 16, creatinine 3.32, albumin 2.4.  BNP 1180.5 Chest x-ray showed mild cardiomegaly and mild atelectasis or scarring at the left lung base IV Lasix 80 mg x 1 was given, potassium was replenished.  Hospitalist was asked to  admit patient for further evaluation and management.  Review of Systems: Review of systems as noted in the HPI. All other systems reviewed and are negative.   Past Medical History:  Diagnosis Date   Arthritis    GERD (gastroesophageal reflux disease)    History of colon polyps    History of prostate cancer    Hyperlipidemia    Hypertension    Kidney cysts    bilateral.  Renal US   Mitral regurgitation 12/02/2020   Past Surgical History:  Procedure Laterality Date   BIOPSY  05/05/2021   Procedure: BIOPSY;  Surgeon: Thornton Park, MD;  Location: [facility];  Service: Gastroenterology;;   COLONOSCOPY     CYSTOSCOPY  02/12/99   biopsy   ESOPHAGEAL BANDING N/A 08/04/2012   Procedure: ESOPHAGEAL BANDING;  Surgeon: Inda Castle, MD;  Location: [facility];  Service: Endoscopy;  Laterality: N/A;   ESOPHAGOGASTRODUODENOSCOPY N/A 08/04/2012   Procedure: ESOPHAGOGASTRODUODENOSCOPY (EGD);  Surgeon: Inda Castle, MD;  Location: [facility];  Service: Endoscopy;  Laterality: N/A;   ESOPHAGOGASTRODUODENOSCOPY (EGD) WITH PROPOFOL N/A 05/05/2021   Procedure: ESOPHAGOGASTRODUODENOSCOPY (EGD) WITH PROPOFOL;  Surgeon: Thornton Park, MD;  Location: [facility];  Service: Gastroenterology;  Laterality: N/A;   INGUINAL HERNIA REPAIR  02/1999   Dr. Reece Agar   POLYPECTOMY  05/05/2021   Procedure: POLYPECTOMY;  Surgeon: Thornton Park, MD;  Location: [facility];  Service: Gastroenterology;;   PROSTATECTOMY  02/1999    Social History:  reports that he quit smoking about 20 years  ago. His smoking use included cigarettes. He has quit using smokeless tobacco.  His smokeless tobacco use included chew. He reports current alcohol use. He reports that he does not use drugs.   Allergies  Allergen Reactions   Lisinopril     cough   Nsaids     Elevated Cr    Family History  Problem Relation Age of Onset   Bone cancer Sister    Stomach cancer Brother    Stomach cancer Brother     Lung cancer Sister    Colon cancer Sister    Stroke Mother    Colon cancer Other        nephew     Prior to Admission medications   Medication Sig Start Date End Date Taking? Authorizing Provider  amLODipine (NORVASC) 10 MG tablet Take 10 mg by mouth daily. 03/29/21  Yes [provider]  ferrous sulfate 325 (65 FE) MG tablet Take 1 tablet (325 mg total) by mouth daily with breakfast. 11/21/21 11/21/22 Yes Tonia Ghent, MD  furosemide (LASIX) 40 MG tablet Take 40 mg by mouth 2 (two) times daily. 05/28/22  Yes [provider]  latanoprost (XALATAN) 0.005 % ophthalmic solution Place 1 drop into both eyes at bedtime.   Yes [provider]  levothyroxine (SYNTHROID) 50 MCG tablet Take 1 tablet (50 mcg total) by mouth daily. Take on empty stomach early in the morning. 12/29/21  Yes Tonia Ghent, MD  omeprazole (PRILOSEC OTC) 20 MG tablet Take 1 tablet (20 mg total) by mouth daily. 12/07/20  Yes Tonia Ghent, MD  sodium bicarbonate 650 MG tablet Take 2 tablets (1,300 mg total) by mouth 2 (two) times daily. 11/01/21  Yes Jill Alexanders, PA-C  tamsulosin (FLOMAX) 0.4 MG CAPS capsule Take 0.4 mg by mouth daily. 05/30/22  Yes [provider]  timolol (BETIMOL) 0.5 % ophthalmic solution Place 1 drop into both eyes 2 (two) times daily.   Yes [provider]  acetaminophen (TYLENOL) 325 MG tablet Take 2 tablets (650 mg total) by mouth every 6 (six) hours as needed. Patient taking differently: Take 650 mg by mouth every 6 (six) hours as needed for mild pain. 11/01/21   Jill Alexanders, PA-C    Physical Exam: BP (!) 128/92 (BP Location: Right Arm)   Pulse 85   Temp (!) 97.3 F (36.3 C) (Oral)   Resp 18   SpO2 96%   General: 88 y.o. year-old male well developed well nourished in no acute distress.  Alert and oriented x3. HEENT: NCAT, EOMI Neck: Supple, trachea medial Cardiovascular: Regular rate and rhythm with no rubs or gallops.  No  thyromegaly or JVD noted.  +2 lower extremity edema to mid shin bilaterally. 2/4 pulses in all 4 extremities. Respiratory: Clear to auscultation with no wheezes or rales. Good inspiratory effort. Abdomen: Soft, nontender nondistended with normal bowel sounds x4 quadrants. Muskuloskeletal: No cyanosis or clubbing noted bilaterally Neuro: CN II-XII intact, strength 5/5 x 4, sensation, reflexes intact Skin: No ulcerative lesions noted or rashes Psychiatry: Judgement and insight appear normal. Mood is appropriate for condition and setting          Labs on Admission:  Basic Metabolic Panel: Recent Labs  Lab 05/31/22 1146  NA 128*  K 3.0*  CL 89*  CO2 26  GLUCOSE 91  BUN 16  CREATININE 3.32*  CALCIUM 8.1*   Liver Function Tests: Recent Labs  Lab 05/31/22 1146  AST 19  ALT 8  ALKPHOS 73  BILITOT 1.0  PROT 7.9  ALBUMIN 2.4*   No results for input(s): "LIPASE", "AMYLASE" in the last 168 hours. No results for input(s): "AMMONIA" in the last 168 hours. CBC: Recent Labs  Lab 05/31/22 1146  WBC 9.4  NEUTROABS 8.2*  HGB 9.6*  HCT 29.9*  MCV 87.7  PLT 357   Cardiac Enzymes: No results for input(s): "CKTOTAL", "CKMB", "CKMBINDEX", "TROPONINI" in the last 168 hours.  BNP (last 3 results) Recent Labs    05/31/22 2211  BNP 1,180.5*    ProBNP (last 3 results) Recent Labs    02/17/22 1622  PROBNP 4,983*    CBG: No results for input(s): "GLUCAP" in the last 168 hours.  Radiological Exams on Admission: DG Chest 2 View  Result Date: 05/31/2022 CLINICAL DATA:  Evaluation for volume overload. Hyponatremia and peripheral edema. EXAM: CHEST - 2 VIEW COMPARISON:  02/27/2022. FINDINGS: The heart is mildly enlarged and the mediastinal contour is within normal limits. The pulmonary vasculature is normal in appearance. Hyperinflation of the lungs is noted. Mild atelectasis or scarring is present at the left lung base. No effusion or pneumothorax. No acute osseous abnormality.  IMPRESSION: 1. Mild cardiomegaly. 2. Mild atelectasis or scarring at the left lung base. Electronically Signed   By: Brett Fairy M.D.   On: 05/31/2022 22:01    EKG: I independently viewed the EKG done and my findings are as followed: Sinus rhythm at a rate of 72 bpm with APCs  Assessment/Plan Present on Admission:  Hypokalemia  Hyponatremia  AKI (acute kidney injury) ([place]) superimposed on CKD stage IV  Essential hypertension  GERD without esophagitis  Anemia, iron deficiency  Principal Problem:   Hypokalemia Active Problems:   AKI (acute kidney injury) ([place]) superimposed on CKD stage IV   Essential hypertension   GERD without esophagitis   Anemia, iron deficiency   Hyponatremia   Hypoalbuminemia due to protein-calorie malnutrition ([facility])   Acquired hypothyroidism   BPH (benign prostatic hyperplasia)  Hypokalemia K+ is 3.0 K+ will be replenished Please monitor for AM K+ for further replenishmemnt  Hyponatremia This is possibly due to hypervolemia She was treated with IV Lasix 80 mg x 1 Continue IV Lasix 40 mg twice daily Continue sodium bicarbonate Continue to monitor sodium with serial BMPs Urine osmolality, osmolality and urine sodium will be checked  Acute kidney injury on CKD 4 BUN/creatinine at 16/3.32 (baseline creatinine at 2.3-2.6) Renally adjust medications, avoid nephrotoxic agents/dehydration/hypotension  Elevated BNP rule out CHF Elevated BNP may be due to patient's worsening kidney function, however, to be reasonable to rule out CHF Chest x-ray showed mild cardiomegaly with mild atelectasis or scarring at the left lung base Continue total input/output, daily weights and fluid restriction Continue IV Lasix as indicated hyponatremia Continue heart healthy/renal diet  Echocardiogram done on 12/01/2020 showed LVEF of 60 to 65%.  No RWMA.  There is severe asymmetric LVH.  Moderate was severe MR is present.  Echocardiogram will be done in the morning    Hypoalbuminemia possibly secondary to moderate protein calorie malnutrition Albumin 2.4, protein supplement will be provided  Essential hypertension Continue Lasix  Acquired hypothyroidism Continue Synthroid  GERD Continue Protonix  Iron deficiency anemia Continue ferrous sulfate  BPH Continue Flomax  DVT prophylaxis: Heparin subcu  Code Status: Full code  Family Communication: Family at bedside (all questions answered to satisfaction)  Consults: Nephrology per ED team  Severity of Illness: The appropriate patient status for this patient is INPATIENT. Inpatient status is  judged to be reasonable and necessary in order to provide the required intensity of service to ensure the patient's safety. The patient's presenting symptoms, physical exam findings, and initial radiographic and laboratory data in the context of their chronic comorbidities is felt to place them at high risk for further clinical deterioration. Furthermore, it is not anticipated that the patient will be medically stable for discharge from the hospital within 2 midnights of admission.   * I certify that at the point of admission it is my clinical judgment that the patient will require inpatient hospital care spanning beyond 2 midnights from the point of admission due to high intensity of service, high risk for further deterioration and high frequency of surveillance required.*  Author: Bernadette Hoit, DO 06/01/2022 2:51 AM  For on call review [email or web address].

## 2022-05-31 NOTE — ED Provider Triage Note (Signed)
Emergency Medicine Provider Triage Evaluation Note  Rick [name] , a 88 y.o. male  was evaluated in triage.  Pt complains of concerns for low sodium.  Was informed by his primary care provider, Dr. Candiss Norse to come into the emergency department due to the low sodium levels.  Patient is asymptomatic today.  Denies chest pain, shortness of breath, abdominal pain, nausea, vomiting, dizziness, lightheadedness, vision changes.  Review of Systems  Positive:  Negative:   Physical Exam  BP (!) 151/84 (BP Location: Right Arm)   Pulse 72   Temp (!) 97.5 F (36.4 C) (Oral)   Resp 19   SpO2 99%  Gen:   Awake, no distress   Resp:  Normal effort  MSK:   Moves extremities without difficulty  Other:    Medical Decision Making  Medically screening exam initiated at 11:25 AM.  Appropriate orders placed.  Rick [name] was informed that the remainder of the evaluation will be completed by another provider, this initial triage assessment does not replace that evaluation, and the importance of remaining in the ED until their evaluation is complete.     Rick [name] A, PA-C 05/31/22 1127

## 2022-05-31 NOTE — ED Triage Notes (Signed)
Patient states that he was sent by his MD for low sodium level. Patient alert and reports that he feels fine. No complaints

## 2022-06-01 ENCOUNTER — Inpatient Hospital Stay (HOSPITAL_COMMUNITY): Payer: Medicare PPO

## 2022-06-01 DIAGNOSIS — E039 Hypothyroidism, unspecified: Secondary | ICD-10-CM | POA: Insufficient documentation

## 2022-06-01 DIAGNOSIS — E876 Hypokalemia: Secondary | ICD-10-CM | POA: Diagnosis not present

## 2022-06-01 DIAGNOSIS — E46 Unspecified protein-calorie malnutrition: Secondary | ICD-10-CM | POA: Insufficient documentation

## 2022-06-01 DIAGNOSIS — I5031 Acute diastolic (congestive) heart failure: Secondary | ICD-10-CM | POA: Diagnosis not present

## 2022-06-01 DIAGNOSIS — N400 Enlarged prostate without lower urinary tract symptoms: Secondary | ICD-10-CM | POA: Insufficient documentation

## 2022-06-01 DIAGNOSIS — E8809 Other disorders of plasma-protein metabolism, not elsewhere classified: Secondary | ICD-10-CM | POA: Insufficient documentation

## 2022-06-01 LAB — BASIC METABOLIC PANEL
Anion gap: 11 (ref 5–15)
BUN: 17 mg/dL (ref 8–23)
CO2: 25 mmol/L (ref 22–32)
Calcium: 8 mg/dL — ABNORMAL LOW (ref 8.9–10.3)
Chloride: 92 mmol/L — ABNORMAL LOW (ref 98–111)
Creatinine, Ser: 3.04 mg/dL — ABNORMAL HIGH (ref 0.61–1.24)
GFR, Estimated: 19 mL/min — ABNORMAL LOW (ref >60)
Glucose, Bld: 114 mg/dL — ABNORMAL HIGH (ref 70–99)
Potassium: 3.9 mmol/L (ref 3.5–5.1)
Sodium: 128 mmol/L — ABNORMAL LOW (ref 135–145)

## 2022-06-01 LAB — COMPREHENSIVE METABOLIC PANEL
ALT: 9 U/L (ref 0–44)
AST: 17 U/L (ref 15–41)
Albumin: 2 g/dL — ABNORMAL LOW (ref 3.5–5.0)
Alkaline Phosphatase: 62 U/L (ref 38–126)
Anion gap: 11 (ref 5–15)
BUN: 16 mg/dL (ref 8–23)
CO2: 26 mmol/L (ref 22–32)
Calcium: 8.1 mg/dL — ABNORMAL LOW (ref 8.9–10.3)
Chloride: 90 mmol/L — ABNORMAL LOW (ref 98–111)
Creatinine, Ser: 3.21 mg/dL — ABNORMAL HIGH (ref 0.61–1.24)
GFR, Estimated: 18 mL/min — ABNORMAL LOW (ref >60)
Glucose, Bld: 117 mg/dL — ABNORMAL HIGH (ref 70–99)
Potassium: 4.2 mmol/L (ref 3.5–5.1)
Sodium: 127 mmol/L — ABNORMAL LOW (ref 135–145)
Total Bilirubin: 0.9 mg/dL (ref 0.3–1.2)
Total Protein: 7 g/dL (ref 6.5–8.1)

## 2022-06-01 LAB — CBC
HCT: 28.5 % — ABNORMAL LOW (ref 39.0–52.0)
Hemoglobin: 9.2 g/dL — ABNORMAL LOW (ref 13.0–17.0)
MCH: 28.3 pg (ref 26.0–34.0)
MCHC: 32.3 g/dL (ref 30.0–36.0)
MCV: 87.7 fL (ref 80.0–100.0)
Platelets: 323 10*3/uL (ref 150–400)
RBC: 3.25 MIL/uL — ABNORMAL LOW (ref 4.22–5.81)
RDW: 15.6 % — ABNORMAL HIGH (ref 11.5–15.5)
WBC: 8.8 10*3/uL (ref 4.0–10.5)
nRBC: 0 % (ref 0.0–0.2)

## 2022-06-01 LAB — ECHOCARDIOGRAM COMPLETE
AR max vel: 1.37 cm2
AV Area VTI: 1.4 cm2
AV Area mean vel: 1.49 cm2
AV Mean grad: 8 mmHg
AV Peak grad: 14.7 mmHg
Ao pk vel: 1.92 m/s
Area-P 1/2: 5.38 cm2
Calc EF: 33.7 %
Height: 67 in
MV M vel: 5.23 m/s
MV Peak grad: 109.4 mmHg
P 1/2 time: 644 msec
Radius: 0.5 cm
S' Lateral: 4.6 cm
Single Plane A2C EF: 36.1 %
Single Plane A4C EF: 33.1 %
Weight: 2112 oz

## 2022-06-01 LAB — SODIUM, URINE, RANDOM: Sodium, Ur: 109 mmol/L

## 2022-06-01 LAB — MAGNESIUM: Magnesium: 1.7 mg/dL (ref 1.7–2.4)

## 2022-06-01 LAB — PHOSPHORUS: Phosphorus: 2.4 mg/dL — ABNORMAL LOW (ref 2.5–4.6)

## 2022-06-01 LAB — OSMOLALITY, URINE: Osmolality, Ur: 257 mOsm/kg — ABNORMAL LOW (ref 300–900)

## 2022-06-01 MED ORDER — FERROUS SULFATE 325 (65 FE) MG PO TABS
325.0000 mg | ORAL_TABLET | Freq: Every day | ORAL | Status: DC
  Administered 2022-06-01 – 2022-06-09 (×9): 325 mg via ORAL
  Filled 2022-06-01 (×9): qty 1

## 2022-06-01 MED ORDER — LEVOTHYROXINE SODIUM 50 MCG PO TABS
50.0000 ug | ORAL_TABLET | Freq: Every day | ORAL | Status: DC
  Administered 2022-06-01 – 2022-06-09 (×9): 50 ug via ORAL
  Filled 2022-06-01: qty 1
  Filled 2022-06-01: qty 2
  Filled 2022-06-01 (×7): qty 1

## 2022-06-01 MED ORDER — ONDANSETRON HCL 4 MG/2ML IJ SOLN: 4.0000 mg | Freq: Four times a day (QID) | INTRAMUSCULAR | Status: DC | PRN

## 2022-06-01 MED ORDER — ALBUMIN HUMAN 25 % IV SOLN
25.0000 g | Freq: Once | INTRAVENOUS | Status: AC
  Administered 2022-06-01: 25 g via INTRAVENOUS
  Filled 2022-06-01: qty 100

## 2022-06-01 MED ORDER — ACETAMINOPHEN 650 MG RE SUPP: 650.0000 mg | Freq: Four times a day (QID) | RECTAL | Status: DC | PRN

## 2022-06-01 MED ORDER — ACETAMINOPHEN 325 MG PO TABS: 650.0000 mg | ORAL_TABLET | Freq: Four times a day (QID) | ORAL | Status: DC | PRN

## 2022-06-01 MED ORDER — TAMSULOSIN HCL 0.4 MG PO CAPS
0.4000 mg | ORAL_CAPSULE | Freq: Every day | ORAL | Status: DC
  Administered 2022-06-01 – 2022-06-09 (×9): 0.4 mg via ORAL
  Filled 2022-06-01 (×9): qty 1

## 2022-06-01 MED ORDER — ONDANSETRON HCL 4 MG PO TABS: 4.0000 mg | ORAL_TABLET | Freq: Four times a day (QID) | ORAL | Status: DC | PRN

## 2022-06-01 MED ORDER — HEPARIN SODIUM (PORCINE) 5000 UNIT/ML IJ SOLN
5000.0000 [IU] | Freq: Three times a day (TID) | INTRAMUSCULAR | Status: DC
  Administered 2022-06-01 – 2022-06-09 (×26): 5000 [IU] via SUBCUTANEOUS
  Filled 2022-06-01 (×26): qty 1

## 2022-06-01 MED ORDER — SODIUM BICARBONATE 650 MG PO TABS
1250.0000 mg | ORAL_TABLET | Freq: Two times a day (BID) | ORAL | Status: DC
  Administered 2022-06-01 – 2022-06-09 (×17): 1300 mg via ORAL
  Filled 2022-06-01 (×17): qty 2

## 2022-06-01 MED ORDER — FUROSEMIDE 10 MG/ML IJ SOLN
40.0000 mg | Freq: Two times a day (BID) | INTRAMUSCULAR | Status: DC
  Administered 2022-06-01: 40 mg via INTRAVENOUS
  Filled 2022-06-01: qty 4

## 2022-06-01 MED ORDER — GLUCERNA SHAKE PO LIQD
237.0000 mL | Freq: Three times a day (TID) | ORAL | Status: DC
  Administered 2022-06-01 – 2022-06-09 (×25): 237 mL via ORAL
  Filled 2022-06-01 (×2): qty 237

## 2022-06-01 MED ORDER — FUROSEMIDE 10 MG/ML IJ SOLN
80.0000 mg | Freq: Two times a day (BID) | INTRAMUSCULAR | Status: DC
  Administered 2022-06-01 – 2022-06-02 (×2): 80 mg via INTRAVENOUS
  Filled 2022-06-01 (×2): qty 8

## 2022-06-01 MED ORDER — PANTOPRAZOLE SODIUM 40 MG PO TBEC: 40.0000 mg | DELAYED_RELEASE_TABLET | Freq: Every day | ORAL | Status: DC

## 2022-06-01 NOTE — Hospital Course (Addendum)
Rick [name] is a 88 y.o. male with a history of CKD stage IV secondary to polycystic kidney disease, hypertension, hyperlipidemia, hypothyroidism, GERD, prostate cancer s/p prostatectomy. Patient presented secondary to evidence of AKI on outpatient metabolic panel. Patient with evidence of fluid overload and newly diagnosed systolic heart failure. Patient diuresed successfully with worsening AKI. Nephrology on board. AKI and sodium stabilized. Patient will require lasix and salt tablets for management of hyponatremia. Imdur and Toprol Xl started for heart failure.

## 2022-06-01 NOTE — Progress Notes (Signed)
PROGRESS NOTE    Patient: Rick [name]                            PCP: Tonia Ghent, MD                    DOB: Nov 01, 1928            DOA: 05/31/2022 DPO:[id number]             DOS: 06/01/2022, 10:58 AM   LOS: 1 day   Date of Service: The patient was seen and examined on 06/01/2022  Subjective:   The patient was seen and examined this morning. Stable at this time. Still complaining of :  Otherwise no issues overnight .  Brief Narrative:   Rick [name] is a 88 y.o. male with medical history significant of CKD stage IV secondary to polycystic kidney disease, hypertension, hyperlipidemia, hypothyroidism, GERD, prostate cancer status post prostatectomy who presented to the emergency department after being asked to go to the ED by his nephrologist (Dr. Candiss Norse).  Patient was unable to provide details regarding why he came to the ED other than that his kidney numbers were going up and was asked to go to the ED for further treatment.  Rest of the history was obtained from ED physician and ED medical record.  Per report, patient has had hypervolemia and decrease in sodium levels over the last couple of weeks, creatinine has been trending up since September, 2023 and sodium was downtrending.  Sodium level was 125 on 12/18 and he was advised to restart his Lasix by his nephrologist and he has been compliant with this, but sodium level decreased to 124 on 12/22 and creatinine continued to stay elevated.  He was asked to go to the emergency room for further evaluation.  Patient complained of 1 to 2 weeks of generalized weakness and some episodes of diarrhea, but denies any other symptoms.   ED Course:  Temperature was 97.3 F, respiratory rate 16/min, pulse was 73 bpm, BP was 142/89 and O2 sat was 93% on room air.  Workup in the ED showed normocytic anemia, sodium 128, potassium 3.0, chloride 89, bicarb 26, glucose 91, BUN 16, creatinine 3.32, albumin 2.4.  BNP 1180.5 Chest x-ray showed mild  cardiomegaly and mild atelectasis or scarring at the left lung base IV Lasix 80 mg x 1 was given, potassium was replenished.  Hospitalist was asked to admit patient for further evaluation and management.       Assessment & Plan:   Principal Problem:   Hypokalemia Active Problems:   AKI (acute kidney injury) ([place]) superimposed on CKD stage IV   Essential hypertension   GERD without esophagitis   Anemia, iron deficiency   Hyponatremia   Hypoalbuminemia due to protein-calorie malnutrition (HCC)   Acquired hypothyroidism   BPH (benign prostatic hyperplasia)      Hypokalemia K+ is 3.0 >>> 4.2 K+ -  replenished Please monitor for AM K+ for further replenishmemnt   Hyponatremia -Serum sodium 128, 127, This is possibly due to hypervolemia She was treated with IV Lasix 80 mg x 1 Continue IV Lasix 40 mg twice daily Continue sodium bicarbonate Continue to monitor sodium with serial BMPs Urine osmolality, osmolality and urine sodium will be checked -Nephrology Dr. Hollie Salk consulted appreciate assistance   Acute kidney injury on CKD 4 BUN/creatinine at 16/3.32 (baseline creatinine at 2.3-2.6) Lab Results  Component Value Date  CREATININE 3.21 (H) 06/01/2022   CREATININE 3.32 (H) 05/31/2022   CREATININE 2.56 (H) 02/17/2022    Renally adjust medications, avoid nephrotoxic agents/dehydration/hypotension -Nephrology consulted, appreciate follow-up   Elevated BNP rule out CHF Elevated BNP may be due to patient's worsening kidney function, however, to be reasonable to rule out CHF Chest x-ray showed mild cardiomegaly with mild atelectasis or scarring at the left lung base Continue total input/output, daily weights and fluid restriction Continue IV Lasix as indicated hyponatremia Continue heart healthy/renal diet        Echocardiogram done on 12/01/2020 showed LVEF of 60 to 65%.  No RWMA.  There is severe asymmetric LVH.  Moderate was severe MR is present.   Echocardiogram will  be done in the morning    Hypoalbuminemia - secondary to moderate protein calorie malnutrition Albumin 2.4, protein supplement will be provided   Essential hypertension Continue Lasix   Acquired hypothyroidism Continue Synthroid   GERD -Will hold Protonix as it may be contributing to hyponatremia   Iron deficiency anemia Continue ferrous sulfate   BPH Continue Flomax         Consult : Nephrology ------------------------------------------------------------------------------------------------------------------------------------- Nutritional status:  The patient's BMI is: Body mass index is 20.67 kg/m. I agree with the assessment and plan as outlined ---------------------------------------------------------------------------------------------------------------------------------------  DVT prophylaxis:  heparin injection 5,000 Units Start: 06/01/22 0600 SCDs Start: 06/01/22 0240   Code Status:   Code Status: Full Code  Family Communication: Family present at bedside-updated The above findings and plan of care has been discussed with patient (and family)  in detail,  they expressed understanding and agreement of above. -Advance care planning has been discussed.   Admission status:   Status is: Inpatient Remains inpatient appropriate because: Needing aggressive management for severe hyponatremia, hyperkalemia     Procedures:   No admission procedures for hospital encounter.   Antimicrobials:  Anti-infectives (From admission, onward)    None        Medication:   feeding supplement (GLUCERNA SHAKE)  237 mL Oral TID BM   ferrous sulfate  325 mg Oral Q breakfast   furosemide  80 mg Intravenous Q12H   heparin  5,000 Units Subcutaneous Q8H   levothyroxine  50 mcg Oral Q0600   sodium bicarbonate  1,300 mg Oral BID   tamsulosin  0.4 mg Oral Daily    acetaminophen **OR** acetaminophen, ondansetron **OR** ondansetron (ZOFRAN) IV   Objective:   Vitals:    06/01/22 0845 06/01/22 0859 06/01/22 0945 06/01/22 1030  BP: (!) 145/90  127/70 (!) 159/97  Pulse: 80  65 86  Resp: '15  10 20  '$ Temp:  97.6 F (36.4 C)    TempSrc:      SpO2: 96%  97% 100%  Weight:      Height:        Intake/Output Summary (Last 24 hours) at 06/01/2022 1058 Last data filed at 06/01/2022 0804 Gross per 24 hour  Intake --  Output 675 ml  Net -675 ml   Filed Weights   06/01/22 0405  Weight: 59.9 kg     Examination:   Physical Exam  Constitution:  Alert, cooperative, no distress,  Appears calm and comfortable  Psychiatric:   Normal and stable mood and affect, cognition intact,   HEENT:        Normocephalic, PERRL, otherwise with in Normal limits  Chest:         Chest symmetric Cardio vascular:  S1/S2, RRR, No murmure, No Rubs or Gallops  pulmonary:  Clear to auscultation bilaterally, respirations unlabored, negative wheezes / crackles Abdomen: Soft, non-tender, non-distended, bowel sounds,no masses, no organomegaly Muscular skeletal: Limited exam - in bed, able to move all 4 extremities,   Neuro: CNII-XII intact. , normal motor and sensation, reflexes intact  Extremities: ++3 pitting edema lower extremities, +2 pulses  Skin: Dry, warm to touch, negative for any Rashes, No open wounds Wounds: per nursing documentation   ------------------------------------------------------------------------------------------------------------------------------------------    LABs:     Latest Ref Rng & Units 06/01/2022    5:14 AM 05/31/2022   11:46 AM 01/06/2022   12:00 AM  CBC  WBC 4.0 - 10.5 K/uL 8.8  9.4    Hemoglobin 13.0 - 17.0 g/dL 9.2  9.6  11.1      Hematocrit 39.0 - 52.0 % 28.5  29.9    Platelets 150 - 400 K/uL 323  357  385         This result is from an external source.      Latest Ref Rng & Units 06/01/2022    5:14 AM 05/31/2022   11:46 AM 02/17/2022    4:22 PM  CMP  Glucose 70 - 99 mg/dL 117  91  89   BUN 8 - 23 mg/dL '16  16  22   '$ Creatinine  0.61 - 1.24 mg/dL 3.21  3.32  2.56   Sodium 135 - 145 mmol/L 127  128  132   Potassium 3.5 - 5.1 mmol/L 4.2  3.0  3.9   Chloride 98 - 111 mmol/L 90  89  95   CO2 22 - 32 mmol/L '26  26  21   '$ Calcium 8.9 - 10.3 mg/dL 8.1  8.1  9.0   Total Protein 6.5 - 8.1 g/dL 7.0  7.9    Total Bilirubin 0.3 - 1.2 mg/dL 0.9  1.0    Alkaline Phos 38 - 126 U/L 62  73    AST 15 - 41 U/L 17  19    ALT 0 - 44 U/L 9  8         Micro Results No results found for this or any previous visit (from the past 240 hour(s)).  Radiology Reports DG Chest 2 View  Result Date: 05/31/2022 CLINICAL DATA:  Evaluation for volume overload. Hyponatremia and peripheral edema. EXAM: CHEST - 2 VIEW COMPARISON:  02/27/2022. FINDINGS: The heart is mildly enlarged and the mediastinal contour is within normal limits. The pulmonary vasculature is normal in appearance. Hyperinflation of the lungs is noted. Mild atelectasis or scarring is present at the left lung base. No effusion or pneumothorax. No acute osseous abnormality. IMPRESSION: 1. Mild cardiomegaly. 2. Mild atelectasis or scarring at the left lung base. Electronically Signed   By: Brett Fairy M.D.   On: 05/31/2022 22:01    SIGNED: Deatra James, MD, FHM. [facility],  Pager (please use [email or web address] to page/text) Please use Epic Secure Chat for non-urgent communication (7AM-7PM)  If 7PM-7AM, please contact night-coverage [email or web address], 06/01/2022, 10:58 AM

## 2022-06-01 NOTE — Progress Notes (Signed)
*  PRELIMINARY RESULTS* Echocardiogram 2D Echocardiogram has been performed.  Samuel Germany 06/01/2022, 4:00 PM

## 2022-06-01 NOTE — Consult Note (Signed)
[name]  HISTORY AND PHYSICAL  Rick [name] is an 88 y.o. male.    Chief Complaint: abnormal labs  HPI: Pt is a 13M with a PMH sig for CKD 3b/IV followed by [name] who is now seen in consultation at the request of Dr Roger Shelter for eval and recs re: AKI on CKD and hyponatremia.    Pt had been working with OP nephrologist to adjust dose of diuretics in the setting of some volume overload.  This was overall unsuccessful as labs this past week showed Na of 124 and Cr of over 3, up from baseline of 2.5.    Pt says he does have some swelling in his legs- better in the AM than the PM.  BP is not low.  Here, Cr is 3.21 and Na is 127.  PMH: Past Medical History:  Diagnosis Date   Arthritis    GERD (gastroesophageal reflux disease)    History of colon polyps    History of prostate cancer    Hyperlipidemia    Hypertension    Kidney cysts    bilateral.  Renal US   Mitral regurgitation 12/02/2020   PSH: Past Surgical History:  Procedure Laterality Date   BIOPSY  05/05/2021   Procedure: BIOPSY;  Surgeon: Thornton Park, MD;  Location: [place];  Service: Gastroenterology;;   COLONOSCOPY     CYSTOSCOPY  02/12/99   biopsy   ESOPHAGEAL BANDING N/A 08/04/2012   Procedure: ESOPHAGEAL BANDING;  Surgeon: Inda Castle, MD;  Location: [facility];  Service: Endoscopy;  Laterality: N/A;   ESOPHAGOGASTRODUODENOSCOPY N/A 08/04/2012   Procedure: ESOPHAGOGASTRODUODENOSCOPY (EGD);  Surgeon: Inda Castle, MD;  Location: [facility];  Service: Endoscopy;  Laterality: N/A;   ESOPHAGOGASTRODUODENOSCOPY (EGD) WITH PROPOFOL N/A 05/05/2021   Procedure: ESOPHAGOGASTRODUODENOSCOPY (EGD) WITH PROPOFOL;  Surgeon: Thornton Park, MD;  Location: [facility];  Service: Gastroenterology;  Laterality: N/A;   INGUINAL HERNIA REPAIR  02/1999   Dr. Reece Agar   POLYPECTOMY  05/05/2021   Procedure: POLYPECTOMY;  Surgeon: Thornton Park, MD;  Location: [facility] END[facility]COPY;  Service:  Gastroenterology;;   PROSTATECTOMY  02/1999     Past Medical History:  Diagnosis Date   Arthritis    GERD (gastroesophageal reflux disease)    History of colon polyps    History of prostate cancer    Hyperlipidemia    Hypertension    Kidney cysts    bilateral.  Renal US   Mitral regurgitation 12/02/2020    Medications:  Scheduled:  feeding supplement (GLUCERNA SHAKE)  237 mL Oral TID BM   ferrous sulfate  325 mg Oral Q breakfast   furosemide  80 mg Intravenous Q12H   heparin  5,000 Units Subcutaneous Q8H   levothyroxine  50 mcg Oral Q0600   sodium bicarbonate  1,300 mg Oral BID   tamsulosin  0.4 mg Oral Daily       ALLERGIES:   Allergies  Allergen Reactions   Lisinopril     cough   Nsaids     Elevated Cr    FAM HX: Family History  Problem Relation Age of Onset   Bone cancer Sister    Stomach cancer Brother    Stomach cancer Brother    Lung cancer Sister    Colon cancer Sister    Stroke Mother    Colon cancer Other        nephew    Social History:   reports that he quit smoking about 20 years ago. His smoking  use included cigarettes. He has quit using smokeless tobacco.  His smokeless tobacco use included chew. He reports current alcohol use. He reports that he does not use drugs.  ROS: ROS: all other systems reviewed and are negative except as per HPI  Blood pressure (!) 146/84, pulse 73, temperature 97.8 F (36.6 C), temperature source Oral, resp. rate 16, height '5\' 7"'$  (1.702 m), weight 59.9 kg, SpO2 100 %. PHYSICAL EXAM: Physical Exam GEN NAD, lying in bed HEENT EOMI PERRL NECK no overt JVD PULM clear CV RRR ABD soft EXT 2+ LE edema NEURO AAO x 3 nonfocal SKIN no rashes   Results for orders placed or performed during the hospital encounter of 05/31/22 (from the past 48 hour(s))  Comprehensive metabolic panel     Status: Abnormal   Collection Time: 05/31/22 11:46 AM  Result Value Ref Range   Sodium 128 (L) 135 - 145 mmol/L   Potassium 3.0  (L) 3.5 - 5.1 mmol/L   Chloride 89 (L) 98 - 111 mmol/L   CO2 26 22 - 32 mmol/L   Glucose, Bld 91 70 - 99 mg/dL    Comment: Glucose reference range applies only to samples taken after fasting for at least 8 hours.   BUN 16 8 - 23 mg/dL   Creatinine, Ser 3.32 (H) 0.61 - 1.24 mg/dL   Calcium 8.1 (L) 8.9 - 10.3 mg/dL   Total Protein 7.9 6.5 - 8.1 g/dL   Albumin 2.4 (L) 3.5 - 5.0 g/dL   AST 19 15 - 41 U/L   ALT 8 0 - 44 U/L   Alkaline Phosphatase 73 38 - 126 U/L   Total Bilirubin 1.0 0.3 - 1.2 mg/dL   GFR, Estimated 17 (L) >60 mL/min    Comment: (NOTE) Calculated using the CKD-EPI Creatinine Equation (2021)    Anion gap 13 5 - 15    Comment: Performed at [facility], [place] [address], [place], [place] [address]  CBC with Differential     Status: Abnormal   Collection Time: 05/31/22 11:46 AM  Result Value Ref Range   WBC 9.4 4.0 - 10.5 K/uL   RBC 3.41 (L) 4.22 - 5.81 MIL/uL   Hemoglobin 9.6 (L) 13.0 - 17.0 g/dL   HCT 29.9 (L) 39.0 - 52.0 %   MCV 87.7 80.0 - 100.0 fL   MCH 28.2 26.0 - 34.0 pg   MCHC 32.1 30.0 - 36.0 g/dL   RDW 15.7 (H) 11.5 - 15.5 %   Platelets 357 150 - 400 K/uL   nRBC 0.0 0.0 - 0.2 %   Neutrophils Relative % 87 %   Neutro Abs 8.2 (H) 1.7 - 7.7 K/uL   Lymphocytes Relative 6 %   Lymphs Abs 0.6 (L) 0.7 - 4.0 K/uL   Monocytes Relative 5 %   Monocytes Absolute 0.5 0.1 - 1.0 K/uL   Eosinophils Relative 2 %   Eosinophils Absolute 0.2 0.0 - 0.5 K/uL   Basophils Relative 0 %   Basophils Absolute 0.0 0.0 - 0.1 K/uL   Immature Granulocytes 0 %   Abs Immature Granulocytes 0.03 0.00 - 0.07 K/uL    Comment: Performed at [facility], [place] [address], [place], [place] [address]  Brain natriuretic peptide     Status: Abnormal   Collection Time: 05/31/22 10:11 PM  Result Value Ref Range   B Natriuretic Peptide 1,180.5 (H) 0.0 - 100.0 pg/mL    Comment: Performed at [facility] [address]  [place], [place] [address]  Osmolality, urine     Status:  Abnormal   Collection Time: 06/01/22  4:41 AM  Result Value Ref Range   Osmolality, Ur 257 (L) 300 - 900 mOsm/kg    Comment: RESULT REPEATED AND VERIFIED Performed at [facility], [place] [place], [place] [address]   Sodium, urine, random     Status: None   Collection Time: 06/01/22  4:41 AM  Result Value Ref Range   Sodium, Ur 109 mmol/L    Comment: Performed at [facility] [address], [place], [place] [address]  CBC     Status: Abnormal   Collection Time: 06/01/22  5:14 AM  Result Value Ref Range   WBC 8.8 4.0 - 10.5 K/uL   RBC 3.25 (L) 4.22 - 5.81 MIL/uL   Hemoglobin 9.2 (L) 13.0 - 17.0 g/dL   HCT 28.5 (L) 39.0 - 52.0 %   MCV 87.7 80.0 - 100.0 fL   MCH 28.3 26.0 - 34.0 pg   MCHC 32.3 30.0 - 36.0 g/dL   RDW 15.6 (H) 11.5 - 15.5 %   Platelets 323 150 - 400 K/uL   nRBC 0.0 0.0 - 0.2 %    Comment: Performed at [facility] [address], [place], [place] [address]  Comprehensive metabolic panel     Status: Abnormal   Collection Time: 06/01/22  5:14 AM  Result Value Ref Range   Sodium 127 (L) 135 - 145 mmol/L   Potassium 4.2 3.5 - 5.1 mmol/L   Chloride 90 (L) 98 - 111 mmol/L   CO2 26 22 - 32 mmol/L   Glucose, Bld 117 (H) 70 - 99 mg/dL    Comment: Glucose reference range applies only to samples taken after fasting for at least 8 hours.   BUN 16 8 - 23 mg/dL   Creatinine, Ser 3.21 (H) 0.61 - 1.24 mg/dL   Calcium 8.1 (L) 8.9 - 10.3 mg/dL   Total Protein 7.0 6.5 - 8.1 g/dL   Albumin 2.0 (L) 3.5 - 5.0 g/dL   AST 17 15 - 41 U/L   ALT 9 0 - 44 U/L   Alkaline Phosphatase 62 38 - 126 U/L   Total Bilirubin 0.9 0.3 - 1.2 mg/dL   GFR, Estimated 18 (L) >60 mL/min    Comment: (NOTE) Calculated using the CKD-EPI Creatinine Equation (2021)    Anion gap 11 5 - 15    Comment: Performed at [facility] [address] [place], [place] [address]  Magnesium     Status: None   Collection Time: 06/01/22  5:14 AM  Result Value Ref Range    Magnesium 1.7 1.7 - 2.4 mg/dL    Comment: Performed at [facility] [address] [place], [place] [address]  Phosphorus     Status: Abnormal   Collection Time: 06/01/22  5:14 AM  Result Value Ref Range   Phosphorus 2.4 (L) 2.5 - 4.6 mg/dL    Comment: Performed at [facility] [address], [place], [place] [address]    DG Chest 2 View  Result Date: 05/31/2022 CLINICAL DATA:  Evaluation for volume overload. Hyponatremia and peripheral edema. EXAM: CHEST - 2 VIEW COMPARISON:  02/27/2022. FINDINGS: The heart is mildly enlarged and the mediastinal contour is within normal limits. The pulmonary vasculature is normal in appearance. Hyperinflation of the lungs is noted. Mild atelectasis or scarring is present at the left lung base. No effusion  or pneumothorax. No acute osseous abnormality. IMPRESSION: 1. Mild cardiomegaly. 2. Mild atelectasis or scarring at the left lung base. Electronically Signed   By: Brett Fairy M.D.   On: 05/31/2022 22:01    Assessment/Plan  AKI on CKD: likely in the setting of volume overload - IV Lasix 80 mg BID - renal US to make sure no obstruction - no indication for RRT at present  2.  Hyponatremia:  - failed OP diuretics  - Lasix 80 IV BID  - will do a dose of albumin today since it's 2.0  - expect to improve with better diuresis  3.  Hypokalemia:  - repleted  4.  Dispo: being admitted  Madelon Lips 06/01/2022, 2:14 PM

## 2022-06-02 DIAGNOSIS — I340 Nonrheumatic mitral (valve) insufficiency: Secondary | ICD-10-CM

## 2022-06-02 DIAGNOSIS — I5041 Acute combined systolic (congestive) and diastolic (congestive) heart failure: Secondary | ICD-10-CM | POA: Diagnosis not present

## 2022-06-02 DIAGNOSIS — E876 Hypokalemia: Secondary | ICD-10-CM | POA: Diagnosis not present

## 2022-06-02 DIAGNOSIS — N184 Chronic kidney disease, stage 4 (severe): Secondary | ICD-10-CM | POA: Diagnosis not present

## 2022-06-02 DIAGNOSIS — E871 Hypo-osmolality and hyponatremia: Secondary | ICD-10-CM | POA: Diagnosis not present

## 2022-06-02 LAB — BASIC METABOLIC PANEL
Anion gap: 10 (ref 5–15)
BUN: 17 mg/dL (ref 8–23)
CO2: 27 mmol/L (ref 22–32)
Calcium: 7.1 mg/dL — ABNORMAL LOW (ref 8.9–10.3)
Chloride: 90 mmol/L — ABNORMAL LOW (ref 98–111)
Creatinine, Ser: 2.97 mg/dL — ABNORMAL HIGH (ref 0.61–1.24)
GFR, Estimated: 19 mL/min — ABNORMAL LOW (ref >60)
Glucose, Bld: 124 mg/dL — ABNORMAL HIGH (ref 70–99)
Potassium: 3.2 mmol/L — ABNORMAL LOW (ref 3.5–5.1)
Sodium: 127 mmol/L — ABNORMAL LOW (ref 135–145)

## 2022-06-02 LAB — COMPREHENSIVE METABOLIC PANEL
ALT: 10 U/L (ref 0–44)
AST: 25 U/L (ref 15–41)
Albumin: 2.5 g/dL — ABNORMAL LOW (ref 3.5–5.0)
Alkaline Phosphatase: 92 U/L (ref 38–126)
Anion gap: 12 (ref 5–15)
BUN: 17 mg/dL (ref 8–23)
CO2: 26 mmol/L (ref 22–32)
Calcium: 8.3 mg/dL — ABNORMAL LOW (ref 8.9–10.3)
Chloride: 88 mmol/L — ABNORMAL LOW (ref 98–111)
Creatinine, Ser: 3.17 mg/dL — ABNORMAL HIGH (ref 0.61–1.24)
GFR, Estimated: 18 mL/min — ABNORMAL LOW (ref >60)
Glucose, Bld: 113 mg/dL — ABNORMAL HIGH (ref 70–99)
Potassium: 3.5 mmol/L (ref 3.5–5.1)
Sodium: 126 mmol/L — ABNORMAL LOW (ref 135–145)
Total Bilirubin: 0.7 mg/dL (ref 0.3–1.2)
Total Protein: 7.5 g/dL (ref 6.5–8.1)

## 2022-06-02 LAB — CBC
HCT: 30 % — ABNORMAL LOW (ref 39.0–52.0)
Hemoglobin: 9.9 g/dL — ABNORMAL LOW (ref 13.0–17.0)
MCH: 28.3 pg (ref 26.0–34.0)
MCHC: 33 g/dL (ref 30.0–36.0)
MCV: 85.7 fL (ref 80.0–100.0)
Platelets: 308 10*3/uL (ref 150–400)
RBC: 3.5 MIL/uL — ABNORMAL LOW (ref 4.22–5.81)
RDW: 15.9 % — ABNORMAL HIGH (ref 11.5–15.5)
WBC: 11.5 10*3/uL — ABNORMAL HIGH (ref 4.0–10.5)
nRBC: 0 % (ref 0.0–0.2)

## 2022-06-02 LAB — LIPID PANEL
Cholesterol: 91 mg/dL (ref 0–200)
HDL: 40 mg/dL — ABNORMAL LOW (ref >40)
LDL Cholesterol: 37 mg/dL (ref 0–99)
Total CHOL/HDL Ratio: 2.3 RATIO
Triglycerides: 68 mg/dL (ref <150)
VLDL: 14 mg/dL (ref 0–40)

## 2022-06-02 LAB — OSMOLALITY: Osmolality: 267 mOsm/kg — ABNORMAL LOW (ref 275–295)

## 2022-06-02 MED ORDER — ISOSORB DINITRATE-HYDRALAZINE 20-37.5 MG PO TABS
1.0000 | ORAL_TABLET | Freq: Three times a day (TID) | ORAL | Status: DC
  Administered 2022-06-02: 1 via ORAL
  Filled 2022-06-02 (×3): qty 1

## 2022-06-02 MED ORDER — METOPROLOL SUCCINATE ER 25 MG PO TB24
12.5000 mg | ORAL_TABLET | Freq: Every day | ORAL | Status: DC
  Administered 2022-06-03 – 2022-06-09 (×7): 12.5 mg via ORAL
  Filled 2022-06-02 (×7): qty 1

## 2022-06-02 MED ORDER — LATANOPROST 0.005 % OP SOLN
1.0000 [drp] | Freq: Every day | OPHTHALMIC | Status: DC
  Administered 2022-06-02 – 2022-06-08 (×8): 1 [drp] via OPHTHALMIC
  Filled 2022-06-02: qty 2.5

## 2022-06-02 MED ORDER — METOPROLOL SUCCINATE ER 25 MG PO TB24
25.0000 mg | ORAL_TABLET | Freq: Every day | ORAL | Status: DC
  Administered 2022-06-02: 25 mg via ORAL
  Filled 2022-06-02: qty 1

## 2022-06-02 MED ORDER — ISOSORB DINITRATE-HYDRALAZINE 20-37.5 MG PO TABS
1.0000 | ORAL_TABLET | Freq: Two times a day (BID) | ORAL | Status: DC
  Administered 2022-06-02 – 2022-06-09 (×14): 1 via ORAL
  Filled 2022-06-02 (×15): qty 1

## 2022-06-02 MED ORDER — TIMOLOL MALEATE 0.5 % OP SOLN
1.0000 [drp] | Freq: Every day | OPHTHALMIC | Status: DC
  Administered 2022-06-02 – 2022-06-09 (×8): 1 [drp] via OPHTHALMIC
  Filled 2022-06-02: qty 5

## 2022-06-02 MED ORDER — ALBUMIN HUMAN 25 % IV SOLN
25.0000 g | Freq: Once | INTRAVENOUS | Status: AC
  Administered 2022-06-02: 25 g via INTRAVENOUS
  Filled 2022-06-02: qty 100

## 2022-06-02 MED ORDER — SODIUM CHLORIDE 1 G PO TABS
1.0000 g | ORAL_TABLET | Freq: Two times a day (BID) | ORAL | Status: DC
  Administered 2022-06-02: 1 g via ORAL
  Filled 2022-06-02 (×2): qty 1

## 2022-06-02 MED ORDER — FUROSEMIDE 10 MG/ML IJ SOLN
40.0000 mg | Freq: Two times a day (BID) | INTRAMUSCULAR | Status: DC
  Administered 2022-06-02 – 2022-06-04 (×4): 40 mg via INTRAVENOUS
  Filled 2022-06-02 (×4): qty 4

## 2022-06-02 NOTE — Progress Notes (Signed)
Brief note:  Na 126 this am, pressures a little soft Albumin given Lasix backed off to 40 IV BID  Continue present management  Madelon Lips MD [facility]

## 2022-06-02 NOTE — Progress Notes (Signed)
Pt family stated pt was unable to communicate with them for brief, they call this nurse and I notified charge RN and Dr. Roger Shelter about the pt condition. Pt vitals are done and pt back in the bed. Pt has orders to transfer, waiting on bed.  Pt transfer to 5W, gave report to [place].

## 2022-06-02 NOTE — Consult Note (Addendum)
Cardiology Consultation   Patient ID: Rick [name] MRN: [id number]; DOB: [birth year]  Admit date: 05/31/2022 Date of Consult: 06/02/2022  PCP:  Tonia Ghent, MD   [place] Providers Cardiologist:  Dr Harrington Challenger    Patient Profile:   Rick [name] is a 88 y.o. 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  History of Present Illness:   Rick [name] with above PMH presented to ER 05/31/22 for abnormal labs.   He follows Dr Harrington Challenger since 11/2020, suffer chronic diastolic heart failure. Echo from 12/01/20 showed LVEF 60-65%, no RWMA, severe asymmetric  LVH, normal RV, RVSP 78mHg, moderate to severe mitral regurgitation. He was hospitalized at the time for CHF, required IV diuresis, medical treatment was recommended for MR given advanced age and poor renal function. He was last seen in the office 02/17/22, has some mild edema, otherwise doing well without SOB and BP well controlled.   He follows nephrology Dr SCandiss Norseadvanced CKD IV 2/2 polycystic kidney disease, has chronic hyponatremia, per office note, he had been started on lasix since 05/26/22 for volume overload and hyponatremia,  outpatient labs from 05/30/22 showed worsening Cr 3.17 (from baseline 2.56) and Na 124  despite taking lasix, he was advised going to the ER by his nephrologist.   Per chart review, he has complained 1-2 weeks duration of generalized weakness, few episode of diarrhea. He endorses leg edema that usually worsens in the afternoon.   Upon encounter, he is sitting in the chair, states he has been feeling well. He noted several month onset of SOB with exertion. He can walk a brief distance and requires  assistance for ADLs. He does not note much leg edema but states he was told he has leg edema. He is urinating well. He denied ever having any chest pain, dizziness, syncope.   Admission labs showed Na 128, K 3, Cl 89, Cr 3.32, albumin 2.4, and eGFR 17. CBC with Hgb 9.6. BNP 1180. CXR showed Mild cardiomegaly. Mild atelectasis or scarring at the left lung base. EKG showed sinus rhythm 72 bpm, first degree AVB, old RBBB, PAC. He was admitted to hospital medicine for AKI on CKD IV as well as chronic hyponatremia, seen by nephrology, recommended IV Lasix 875mBID for volume overload, renal ultrasound showed numerous cysts, large shadowing left mid kidney stone. Echo was done12/24/23 showed LVEF 30-35%, global hypokinesis, normal RV, mod LAE, mild RAE, small pericardial effusion, and moderate to severe MR, moderate AO, mild AS. Cardiology is consulted today for low EF on Echo.     Past Medical History:  Diagnosis Date   Arthritis    GERD (gastroesophageal reflux disease)    History of colon polyps    History of prostate cancer    Hyperlipidemia    Hypertension    Kidney cysts    bilateral.  Renal USK[place] Mitral regurgitation 12/02/2020    Past Surgical History:  Procedure Laterality Date   BIOPSY  05/05/2021   Procedure: BIOPSY;  Surgeon: BeThornton ParkMD;  [facility] [facility] Service: [facility]ology;;   COLONOSCOPY     CYSTOSCOPY  02/12/99   biopsy   ESOPHAGEAL BANDING N/A 08/04/2012   Procedure: ESOPHAGEAL BANDING;  Surgeon: Inda Castle, MD;  Location: [facility];  Service: Endoscopy;  Laterality: N/A;   ESOPHAGOGASTRODUODENOSCOPY N/A 08/04/2012   Procedure: ESOPHAGOGASTRODUODENOSCOPY (EGD);  Surgeon: Inda Castle, MD;  Location: [facility];  Service: Endoscopy;  Laterality: N/A;   ESOPHAGOGASTRODUODENOSCOPY (EGD) WITH PROPOFOL N/A 05/05/2021   Procedure: ESOPHAGOGASTRODUODENOSCOPY (EGD) WITH PROPOFOL;  Surgeon: Thornton Park, MD;  Location: [facility];  Service:  Gastroenterology;  Laterality: N/A;   INGUINAL HERNIA REPAIR  02/1999   Dr. Reece Agar   POLYPECTOMY  05/05/2021   Procedure: POLYPECTOMY;  Surgeon: Thornton Park, MD;  Location: [facility];  Service: Gastroenterology;;   PROSTATECTOMY  02/1999     Home Medications:  Prior to Admission medications   Medication Sig Start Date End Date Taking? Authorizing Provider  amLODipine (NORVASC) 10 MG tablet Take 10 mg by mouth daily. 03/29/21  Yes [provider]  ferrous sulfate 325 (65 FE) MG tablet Take 1 tablet (325 mg total) by mouth daily with breakfast. 11/21/21 11/21/22 Yes Tonia Ghent, MD  furosemide (LASIX) 40 MG tablet Take 40 mg by mouth 2 (two) times daily. 05/28/22  Yes [provider]  latanoprost (XALATAN) 0.005 % ophthalmic solution Place 1 drop into both eyes at bedtime.   Yes [provider]  levothyroxine (SYNTHROID) 50 MCG tablet Take 1 tablet (50 mcg total) by mouth daily. Take on empty stomach early in the morning. 12/29/21  Yes Tonia Ghent, MD  omeprazole (PRILOSEC OTC) 20 MG tablet Take 1 tablet (20 mg total) by mouth daily. 12/07/20  Yes Tonia Ghent, MD  sodium bicarbonate 650 MG tablet Take 2 tablets (1,300 mg total) by mouth 2 (two) times daily. 11/01/21  Yes Jill Alexanders, PA-C  tamsulosin (FLOMAX) 0.4 MG CAPS capsule Take 0.4 mg by mouth daily. 05/30/22  Yes [provider]  timolol (BETIMOL) 0.5 % ophthalmic solution Place 1 drop into both eyes 2 (two) times daily.   Yes [provider]  acetaminophen (TYLENOL) 325 MG tablet Take 2 tablets (650 mg total) by mouth every 6 (six) hours as needed. Patient taking differently: Take 650 mg by mouth every 6 (six) hours as needed for mild pain. 11/01/21   Jill Alexanders, PA-C    Inpatient Medications: Scheduled Meds:  feeding supplement (GLUCERNA SHAKE)  237 mL Oral TID BM   ferrous sulfate  325 mg Oral Q breakfast   furosemide  40 mg Intravenous Q12H   heparin   5,000 Units Subcutaneous Q8H   isosorbide-hydrALAZINE  1 tablet Oral TID   levothyroxine  50 mcg Oral Q0600   metoprolol succinate  25 mg Oral Daily   sodium bicarbonate  1,300 mg Oral BID   sodium chloride  1 g Oral BID WC   tamsulosin  0.4 mg Oral Daily   Continuous Infusions:   PRN Meds: acetaminophen **OR** acetaminophen, ondansetron **OR** ondansetron (ZOFRAN) IV  Allergies:    Allergies  Allergen Reactions   Lisinopril     cough   Nsaids     Elevated Cr    Social History:   Social History   Socioeconomic History   Marital status: Married    Spouse name: Not on file   Number of children: 6   Years of education: Not on file   Highest education level: Not on file  Occupational History   Occupation: retired- Patent attorney, Biochemist, clinical    Comment: at [employer], Sport and exercise psychologist: RETIRED  Tobacco Use  Smoking status: Former    Types: Cigarettes    Quit date: 08/26/2001    Years since quitting: 20.7   Smokeless tobacco: Former    Types: Chew   Tobacco comments:    quit about 10 years or more  Vaping Use   Vaping Use: Never used  Substance and Sexual Activity   Alcohol use: Yes   Drug use: No   Sexual activity: Not Currently  Other Topics Concern   Not on file  Social History Narrative   Retired Psychologist, sport and exercise, worked at [employer], [place]   Married 1955   6 kids initially, oldest daughter died after surgery   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, 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   Social Determinants of Health   Financial Resource Strain: [place]  (11/27/2021)   Overall Financial Resource Strain (CARDIA)    Difficulty of Paying Living Expenses: Not hard at all  Food Insecurity: No Food Insecurity (11/27/2021)   Hunger Vital Sign    Worried About Running Out of Food in the Last Year: Never true    [place] in the Last Year: Never true  Transportation Needs: No Transportation Needs (11/27/2021)   PRAPARE - Hydrologist (Medical):  No    Lack of Transportation (Non-Medical): No  Physical Activity: Insufficiently Active (11/27/2021)   Exercise Vital Sign    Days of Exercise per Week: 7 days    Minutes of Exercise per Session: 10 min  Stress: No Stress Concern Present (11/27/2021)   [place]    Feeling of Stress : Not at all  Social Connections: Two Strike (11/27/2021)   Social Connection and Isolation Panel [NHANES]    Frequency of Communication with Friends and Family: More than three times a week    Frequency of Social Gatherings with Friends and Family: More than three times a week    Attends Religious Services: More than 4 times per year    Active Member of [employer] or Organizations: Yes    Attends Music therapist: More than 4 times per year    Marital Status: Married  Human resources officer Violence: Not At Risk (11/27/2021)   Humiliation, Afraid, Rape, and Kick questionnaire    Fear of Current or Ex-Partner: No    Emotionally Abused: No    Physically Abused: No    Sexually Abused: No    Family History:    Family History  Problem Relation Age of Onset   Bone cancer Sister    Stomach cancer Brother    Stomach cancer Brother    Lung cancer Sister    Colon cancer Sister    Stroke Mother    Colon cancer Other        nephew     ROS:  Constitutional: Denied fever, chills, malaise, night sweats Eyes: Denied vision change or loss Ears/Nose/Mouth/Throat: Denied ear ache, sore throat, coughing, sinus pain Cardiovascular: see HPI Respiratory: shortness of breath on exertion  Gastrointestinal: Denied nausea, vomiting, abdominal pain, diarrhea Genital/Urinary: Denied dysuria, hematuria, urinary frequency/urgency Musculoskeletal: Denied muscle ache, joint pain, weakness Skin: Denied rash, wound Neuro: Denied headache, dizziness, syncope Psych: Denied history of depression/anxiety  Endocrine: Denied history of  diabetes   Physical Exam/Data:   Vitals:   06/01/22 1951 06/02/22 0500 06/02/22 0535 06/02/22 0751  BP: (!) 146/92  133/89 (!) 143/81  Pulse: 77  72 76  Resp: _0 Temp: (!) 97.5 F (36.4 C)  97.6 F (  36.4 C) 97.7 F (36.5 C)  TempSrc: Oral  Oral Oral  SpO2: 94%  95% 100%  Weight:  64.1 kg    Height:        Intake/Output Summary (Last 24 hours) at 06/02/2022 0959 Last data filed at 06/02/2022 0600 Gross per 24 hour  Intake 240 ml  Output 1900 ml  Net -1660 ml      06/02/2022    5:00 AM 06/01/2022    4:05 AM 02/27/2022   12:06 PM  Last 3 Weights  Weight (lbs) 141 lb 5 oz 132 lb 132 lb  Weight (kg) 64.1 kg 59.875 kg 59.875 kg     Body mass index is 22.13 kg/m.  Vitals:  Vitals:   06/02/22 0535 06/02/22 0751  BP: 133/89 (!) 143/81  Pulse: 72 76  Resp: 18 16  Temp: 97.6 F (36.4 C) 97.7 F (36.5 C)  SpO2: 95% 100%   General Appearance: In no apparent distress, sitting in the chair  HEENT: Normocephalic, atraumatic.  Neck: Supple, trachea midline, no JVDs Cardiovascular: Regular rate and rhythm, normal S1-S2,  grade II systolic murmur at apex noted  Respiratory: Resting breathing unlabored, lungs sounds clear to auscultation bilaterally, no use of accessory muscles. On room air.  No wheezes, rales or rhonchi.   Gastrointestinal: Bowel sounds positive, abdomen soft, round/obese Extremities: Able to move all extremities in bed without difficulty, TEDs on, trace edema of BLE noted  Musculoskeletal: Normal muscle bulk and tone Skin: Intact, warm, dry. No rashes  Neurologic: Alert, oriented to person, place and time. No cognitive deficit Psychiatric: Normal affect. Mood is appropriate.      EKG:  The EKG was personally reviewed and demonstrates:    Sinus rhythm at 72 bpm, old RBBB, first degree AVB, PAC  Telemetry:  Telemetry was personally reviewed and demonstrates:  N/A  Relevant CV Studies:  Echo 06/01/22:   1. LVEF 30-35% with global  hypokinesis. Significant LV dyssynchrony  present. Left ventricular ejection fraction, by estimation, is 30 to 35%.  The left ventricle has moderately decreased function. The left ventricle  demonstrates global hypokinesis. There   is mild concentric left ventricular hypertrophy. Indeterminate diastolic  filling due to E-A fusion.   2. Right ventricular systolic function is normal. The right ventricular  size is normal.   3. Left atrial size was moderately dilated.   4. Right atrial size was mildly dilated.   5. A small pericardial effusion is present. The pericardial effusion is  anterior to the right ventricle.   6. Moderate to severe MR. Eccentric jet. The mitral valve is grossly  normal. Moderate to severe mitral valve regurgitation. No evidence of  mitral stenosis.   7. The aortic valve is tricuspid. There is moderate calcification of the  aortic valve. There is moderate thickening of the aortic valve. Aortic  valve regurgitation is moderate. Mild aortic valve stenosis.   Comparison(s): Changes from prior study are noted. LVEF now 30-35%.  Moderate to severe MR remains present. AI is moderate. Significant LV  dyssynchrony present consistent with LBBB.    Echo from 12/01/20:   1. Moderate to severe mitral regurgitation is present, likely severe. The  jet is eccentric and anteriorly directed. There is likely pulmonary vein  flow reversal in systole in the right inferior pulmonary vein. There is  splay artifact present, indicating   at least moderate severity. The jet appears to wrap around the LA,  indicative of severe MR. RVSP elevated. There is no obvious flail  segment,  but suspect this is not well seen. Would recommend a TEE for  characterization of the MR. The mitral valve is  grossly normal. Moderate to severe mitral valve regurgitation. No evidence  of mitral stenosis.   2. Left ventricular ejection fraction, by estimation, is 60 to 65%. The  left ventricle has normal  function. The left ventricle has no regional  wall motion abnormalities. There is severe asymmetric left ventricular  hypertrophy of the basal-septal  segment. Indeterminate diastolic filling due to E-A fusion.   3. Right ventricular systolic function is normal. The right ventricular  size is normal. There is mildly elevated pulmonary artery systolic  pressure. The estimated right ventricular systolic pressure is 89.2 mmHg.   4. The aortic valve is tricuspid. Aortic valve regurgitation is mild to  moderate. Mild aortic valve sclerosis is present, with no evidence of  aortic valve stenosis.   5. The inferior vena cava is normal in size with greater than 50%  respiratory variability, suggesting right atrial pressure of 3 mmHg.   6. There is a large cystic structure located inferior to the liver, which  could represent a cyst. Would recommend dedicated liver imaging.   Laboratory Data:  High Sensitivity Troponin:  No results for input(s): "TROPONINIHS" in the last 720 hours.   Chemistry Recent Labs  Lab 06/01/22 0514 06/01/22 1831 06/02/22 0223  NA 127* 128* 126*  K 4.2 3.9 3.5  CL 90* 92* 88*  CO2 _0 GLUCOSE 117* 114* 113*  BUN _1 CREATININE 3.21* 3.04* 3.17*  CALCIUM 8.1* 8.0* 8.3*  MG 1.7  --   --   GFRNONAA 18* 19* 18*  ANIONGAP _2 Recent Labs  Lab 05/31/22 1146 06/01/22 0514 06/02/22 0223  PROT 7.9 7.0 7.5  ALBUMIN 2.4* 2.0* 2.5*  AST _3 ALT _4 ALKPHOS 73 62 92  BILITOT 1.0 0.9 0.7   Lipids No results for input(s): "CHOL", "TRIG", "HDL", "LABVLDL", "LDLCALC", "CHOLHDL" in the last 168 hours.  Hematology Recent Labs  Lab 05/31/22 1146 06/01/22 0514 06/02/22 0223  WBC 9.4 8.8 11.5*  RBC 3.41* 3.25* 3.50*  HGB 9.6* 9.2* 9.9*  HCT 29.9* 28.5* 30.0*  MCV 87.7 87.7 85.7  MCH 28.2 28.3 28.3  MCHC 32.1 32.3 33.0  RDW 15.7* 15.6* 15.9*  PLT 357 323 308   Thyroid No results for input(s): "TSH", "FREET4" in the last 168 hours.   BNP Recent Labs  Lab 05/31/22 2211  BNP 1,180.5*    DDimer No results for input(s): "DDIMER" in the last 168 hours.   Radiology/Studies:  ECHOCARDIOGRAM COMPLETE  Result Date: 06/01/2022    ECHOCARDIOGRAM REPORT   Patient Name:   GREOGORY CORNETTE Date of Exam: 06/01/2022 Medical Rec #:  [id number]       Height:       67.0 in Accession #:    [id number]      Weight:       132.0 lb Date of Birth:  October 05, 1928       BSA:          1.695 m Patient Age:    53 years        BP:           127/70 mmHg Patient Gender: M               HR:           89 bpm.  Exam Location:  Inpatient Procedure: 2D Echo, Cardiac Doppler and Color Doppler Indications:    CHF-Acute Diastolic O87.86  History:        Patient has prior history of Echocardiogram examinations, most                 recent 12/01/2020. CHF; Risk Factors:Hypertension and                 Dyslipidemia. TOBACCO ABUSE, HX OF, History of prostate cancer.  Sonographer:    Alvino Chapel RCS Referring Phys: [id number] OLADAPO ADEFESO IMPRESSIONS  1. LVEF 30-35% with global hypokinesis. Significant LV dyssynchrony present. Left ventricular ejection fraction, by estimation, is 30 to 35%. The left ventricle has moderately decreased function. The left ventricle demonstrates global hypokinesis. There  is mild concentric left ventricular hypertrophy. Indeterminate diastolic filling due to E-A fusion.  2. Right ventricular systolic function is normal. The right ventricular size is normal.  3. Left atrial size was moderately dilated.  4. Right atrial size was mildly dilated.  5. A small pericardial effusion is present. The pericardial effusion is anterior to the right ventricle.  6. Moderate to severe MR. Eccentric jet. The mitral valve is grossly normal. Moderate to severe mitral valve regurgitation. No evidence of mitral stenosis.  7. The aortic valve is tricuspid. There is moderate calcification of the aortic valve. There is moderate thickening of the aortic valve. Aortic valve  regurgitation is moderate. Mild aortic valve stenosis. Comparison(s): Changes from prior study are noted. LVEF now 30-35%. Moderate to severe MR remains present. AI is moderate. Significant LV dyssynchrony present consistent with LBBB. FINDINGS  Left Ventricle: LVEF 30-35% with global hypokinesis. Significant LV dyssynchrony present. Left ventricular ejection fraction, by estimation, is 30 to 35%. The left ventricle has moderately decreased function. The left ventricle demonstrates global hypokinesis. The left ventricular internal cavity size was normal in size. There is mild concentric left ventricular hypertrophy. Abnormal (paradoxical) septal motion, consistent with left bundle branch block. Indeterminate diastolic filling due to E-A fusion. Right Ventricle: The right ventricular size is normal. No increase in right ventricular wall thickness. Right ventricular systolic function is normal. Left Atrium: Left atrial size was moderately dilated. Right Atrium: Right atrial size was mildly dilated. Pericardium: A small pericardial effusion is present. The pericardial effusion is anterior to the right ventricle. Mitral Valve: Moderate to severe MR. Eccentric jet. The mitral valve is grossly normal. Moderate to severe mitral valve regurgitation. No evidence of mitral valve stenosis. Tricuspid Valve: The tricuspid valve is grossly normal. Tricuspid valve regurgitation is mild . No evidence of tricuspid stenosis. Aortic Valve: The aortic valve is tricuspid. There is moderate calcification of the aortic valve. There is moderate thickening of the aortic valve. Aortic valve regurgitation is moderate. Aortic regurgitation PHT measures 644 msec. Mild aortic stenosis is present. Aortic valve mean gradient measures 8.0 mmHg. Aortic valve peak gradient measures 14.7 mmHg. Aortic valve area, by VTI measures 1.40 cm. Pulmonic Valve: The pulmonic valve was grossly normal. Pulmonic valve regurgitation is mild. No evidence of  pulmonic stenosis. Aorta: The aortic root is normal in size and structure. Venous: The inferior vena cava was not well visualized. IAS/Shunts: The atrial septum is grossly normal.  LEFT VENTRICLE PLAX 2D LVIDd:         5.40 cm      Diastology LVIDs:         4.60 cm      LV e' medial:    7.07 cm/s LV PW:  1.20 cm      LV E/e' medial:  13.0 LV IVS:        1.40 cm      LV e' lateral:   9.25 cm/s LVOT diam:     1.70 cm      LV E/e' lateral: 9.9 LV SV:         55 LV SV Index:   32 LVOT Area:     2.27 cm  LV Volumes (MOD) LV vol d, MOD A2C: 119.0 ml LV vol d, MOD A4C: 157.0 ml LV vol s, MOD A2C: 76.0 ml LV vol s, MOD A4C: 105.0 ml LV SV MOD A2C:     43.0 ml LV SV MOD A4C:     157.0 ml LV SV MOD BP:      49.3 ml RIGHT VENTRICLE RV S prime:     13.20 cm/s TAPSE (M-mode): 2.0 cm LEFT ATRIUM              Index        RIGHT ATRIUM           Index LA diam:        3.20 cm  1.89 cm/m   RA Area:     24.40 cm LA Vol (A2C):   120.0 ml 70.80 ml/m  RA Volume:   70.00 ml  41.30 ml/m LA Vol (A4C):   49.6 ml  29.26 ml/m LA Biplane Vol: 78.4 ml  46.26 ml/m  AORTIC VALVE AV Area (Vmax):    1.37 cm AV Area (Vmean):   1.49 cm AV Area (VTI):     1.40 cm AV Vmax:           192.00 cm/s AV Vmean:          122.000 cm/s AV VTI:            0.391 m AV Peak Grad:      14.7 mmHg AV Mean Grad:      8.0 mmHg LVOT Vmax:         116.00 cm/s LVOT Vmean:        80.200 cm/s LVOT VTI:          0.241 m LVOT/AV VTI ratio: 0.62 AI PHT:            644 msec  AORTA Ao Root diam: 3.30 cm MITRAL VALVE                  TRICUSPID VALVE MV Area (PHT): 5.38 cm       TR Peak grad:   24.0 mmHg MV Decel Time: 141 msec       TR Vmax:        245.00 cm/s MR Peak grad:    109.4 mmHg MR Mean grad:    57.0 mmHg    SHUNTS MR Vmax:         523.00 cm/s  Systemic VTI:  0.24 m MR Vmean:        340.0 cm/s   Systemic Diam: 1.70 cm MR PISA:         1.57 cm MR PISA Eff ROA: 9 mm MR PISA Radius:  0.50 cm MV E velocity: 91.70 cm/s Eleonore Chiquito MD Electronically signed  by Eleonore Chiquito MD Signature Date/Time: 06/01/2022/4:29:52 PM    Final    US RENAL  Result Date: 06/01/2022 CLINICAL DATA:  Acute kidney injury. EXAM: RENAL / URINARY TRACT ULTRASOUND COMPLETE COMPARISON:  CT, 12/28/2021. FINDINGS: Right Kidney: Renal measurements: 24 x 12  x 22 cm = volume: 3,580 mL. Multiple large cysts. Largest cyst 26 cm. Minimal renal parenchyma. No convincing hydronephrosis. Left Kidney: Renal measurements: 23.2 x 11.9 x 11.5 cm = volume: 1,655 mL. Numerous cysts. Echogenic shadowing stone in the central kidney consistent with the large staghorn calculus noted on the prior CT. Largest 2 cysts, upper lower pole, 9.5 x 7.7 x 0.8 cm and 9.4 x 7.3 x 7.5 cm respectively. No solid masses. Limited renal parenchyma visualized. No convincing hydronephrosis. Bladder: Not visualized. Other: None. IMPRESSION: 1. Limited sonographic assessment of the kidneys due to the numerous cysts, large renal sizes and limited parenchyma. 2. Overall, stable appearance compared to the CT from 12/28/2021. 3. Large shadowing left mid kidney stone consistent with a large staghorn calculus noted on the prior study. Electronically Signed   By: Lajean Manes M.D.   On: 06/01/2022 15:30   DG Chest 2 View  Result Date: 05/31/2022 CLINICAL DATA:  Evaluation for volume overload. Hyponatremia and peripheral edema. EXAM: CHEST - 2 VIEW COMPARISON:  02/27/2022. FINDINGS: The heart is mildly enlarged and the mediastinal contour is within normal limits. The pulmonary vasculature is normal in appearance. Hyperinflation of the lungs is noted. Mild atelectasis or scarring is present at the left lung base. No effusion or pneumothorax. No acute osseous abnormality. IMPRESSION: 1. Mild cardiomegaly. 2. Mild atelectasis or scarring at the left lung base. Electronically Signed   By: Brett Fairy M.D.   On: 05/31/2022 22:01     Assessment and Plan:   Newly discovered systolic dysfunction Acute systolic and diastolic heart  failure  - presented for chronic hyponatremia and AKI on CKD IV, + volume overload per admission notes - SOB likely multifactorial from advanced CKD, hypoalbuminemia, CHF, and decondition  - BNP  1180 - CXR no acute finding - Echo showed LVEF 30-35%, global hypokinesis, normal RV, mod LAE, mild RAE, small pericardial effusion, and moderate to severe MR, moderate AO, mild AS.  - Etiology of cardiomyopathy unclear, no ischemic evaluation given renal failure and asymptomatic at this time, known HTN and MR and RBBB maybe contributing - Diuresis directed per nephrology, currently on IV Lasix 2m BID, Net -2.3L , continue trend weight/I&O - GDMT: will start Metoprolol XL 220mdaily, Bidil 20/37.3m62mID, no ARNI/MRA/SGLT2I given advanced renal disease  - check lipid panel, consider statin if indicated   Moderate to severe mitral regurgitation  - Echo as above, medical management was felt appropriate historically given his age/comorbidity    HTN - BP elevated, on amlodipine at home, may stop and start metoprolol and Bidil as above  AKI on CKD IV Polycystic kidney disease Chronic hyponatremia  Leukocytosis Normocytic Anemia  HLD GERD - per primary team    Risk Assessment/Risk Scores:  { New [employer] (NYHA) Functional Class NYHA Class II        For questions or updates, please contact [employer] consult [email or web address] for contact info under    Signed, XikMargie BilletP  06/02/2022 9:59 AM

## 2022-06-02 NOTE — Progress Notes (Signed)
PROGRESS NOTE    Patient: Rick [name]                            PCP: Tonia Ghent, MD                    DOB: 1928/07/16            DOA: 05/31/2022 YKZ:[id number]             DOS: 06/02/2022, 10:11 AM   LOS: 2 days   Date of Service: The patient was seen and examined on 06/02/2022  Subjective:   The patient was seen and examined this morning. Hemodynamically stable, mildly hypertensive With oriented, following commands, denies shortness of breath or chest pain    Brief Narrative:   Rick [name] is a 88 y.o. male with medical history significant of CKD stage IV secondary to polycystic kidney disease, hypertension, hyperlipidemia, hypothyroidism, GERD, prostate cancer status post prostatectomy who presented to the emergency department after being asked to go to the ED by his nephrologist (Dr. Candiss Norse).  Patient was unable to provide details regarding why he came to the ED other than that his kidney numbers were going up and was asked to go to the ED for further treatment.  Rest of the history was obtained from ED physician and ED medical record.  Per report, patient has had hypervolemia and decrease in sodium levels over the last couple of weeks, creatinine has been trending up since September, 2023 and sodium was downtrending.  Sodium level was 125 on 12/18 and he was advised to restart his Lasix by his nephrologist and he has been compliant with this, but sodium level decreased to 124 on 12/22 and creatinine continued to stay elevated.  He was asked to go to the emergency room for further evaluation.  Patient complained of 1 to 2 weeks of generalized weakness and some episodes of diarrhea, but denies any other symptoms.   ED Course:  Temperature was 97.3 F, respiratory rate 16/min, pulse was 73 bpm, BP was 142/89 and O2 sat was 93% on room air.  Workup in the ED showed normocytic anemia, sodium 128, potassium 3.0, chloride 89, bicarb 26, glucose 91, BUN 16, creatinine 3.32, albumin  2.4.  BNP 1180.5 Chest x-ray showed mild cardiomegaly and mild atelectasis or scarring at the left lung base IV Lasix 80 mg x 1 was given, potassium was replenished.  Hospitalist was asked to admit patient for further evaluation and management.    Assessment & Plan:   Principal Problem:   Hypokalemia Active Problems:   AKI (acute kidney injury) ([facility]) superimposed on CKD stage IV   Essential hypertension   GERD without esophagitis   Anemia, iron deficiency   Hyponatremia   Hypoalbuminemia due to protein-calorie malnutrition (HCC)   Acquired hypothyroidism   BPH (benign prostatic hyperplasia)      Hypokalemia K+ is 3.0 >>> 4.2, 3.9, 3.5  K+ - was  replenished Please monitor for AM K+ for further replenishmemnt   Hyponatremia -Serum sodium 128, 127, 128, 1.6 today This is possibly due to hypervolemia She was treated with IV Lasix 80 mg >>> switching to 40 twice a day Due to progressively elevated creatinine  S/p  sodium bicarbonate usually Continue to monitor sodium with serial BMPs Serum osmole low 267, urine osmole low 257, urine sodium 109 -Nephrology Dr. Hollie Salk consulted appreciate assistance   Acute kidney injury on CKD 4  BUN/creatinine at 16/3.32 (baseline creatinine at 2.3-2.6) Lab Results  Component Value Date   CREATININE 3.17 (H) 06/02/2022   CREATININE 3.04 (H) 06/01/2022   CREATININE 3.21 (H) 06/01/2022   Renally adjust medications, avoid nephrotoxic agents/dehydration/hypotension -Nephrology consulted, appreciate follow-up   Systolic congestive heart failure HFrEF -newly diagnosed -It seems that his systolic and diastolic heart failure  Echo: 12/01/2020 showed LVEF of 60 to 65%. severe asymmetric LVH.  Moderate was severe MR is present -Echocardiogram 06/01/2022: Reviewed EJ EF of 30-35%, global hypokinesis, small pericardial effusion, moderate to severe MR, moderate AR, mild AS,  -Cardiology consulted: Appreciate further evaluation and  recommendation -Per current recommendation continue Lasix IV, monitoring I's and O's, daily weight, - started metoprolol XL 25 mg, vital 20/37.5 mg 3 times daily, ARNI/MRA/SGLT2I given advanced renal disease    Chest x-ray showed mild cardiomegaly with mild atelectasis or scarring at the left lung base Continue total input/output, daily weights and fluid restriction Continue IV Lasix as indicated hyponatremia Continue heart healthy/renal diet          Hypoalbuminemia - secondary to moderate protein calorie malnutrition Albumin 2.4, protein supplement will be provided   Essential hypertension Continue Lasix   Acquired hypothyroidism Continue Synthroid   GERD -Will hold Protonix as it may be contributing to hyponatremia   Iron deficiency anemia Continue ferrous sulfate   BPH Continue Flomax         Consult : Nephrology /cardiology ------------------------------------------------------------------------------------------------------------------------------------- Nutritional status:  The patient's BMI is: Body mass index is 22.13 kg/m. I agree with the assessment and plan as outlined -------------------------------------------------------------------------------------------------------------------------------------  DVT prophylaxis:  heparin injection 5,000 Units Start: 06/01/22 0600 SCDs Start: 06/01/22 0240   Code Status:   Code Status: Full Code  Family Communication: Family present at bedside-updated The above findings and plan of care has been discussed with patient (and family)  in detail,  they expressed understanding and agreement of above. -Advance care planning has been discussed.   Admission status:   Status is: Inpatient Remains inpatient appropriate because: Needing aggressive management for severe hyponatremia, hyperkalemia     Procedures:   No admission procedures for hospital encounter.   Antimicrobials:  Anti-infectives (From admission,  onward)    None        Medication:   feeding supplement (GLUCERNA SHAKE)  237 mL Oral TID BM   ferrous sulfate  325 mg Oral Q breakfast   furosemide  40 mg Intravenous Q12H   heparin  5,000 Units Subcutaneous Q8H   isosorbide-hydrALAZINE  1 tablet Oral TID   levothyroxine  50 mcg Oral Q0600   metoprolol succinate  25 mg Oral Daily   sodium bicarbonate  1,300 mg Oral BID   sodium chloride  1 g Oral BID WC   tamsulosin  0.4 mg Oral Daily    acetaminophen **OR** acetaminophen, ondansetron **OR** ondansetron (ZOFRAN) IV   Objective:   Vitals:   06/01/22 1951 06/02/22 0500 06/02/22 0535 06/02/22 0751  BP: (!) 146/92  133/89 (!) 143/81  Pulse: 77  72 76  Resp: '19  18 16  '$ Temp: (!) 97.5 F (36.4 C)  97.6 F (36.4 C) 97.7 F (36.5 C)  TempSrc: Oral  Oral Oral  SpO2: 94%  95% 100%  Weight:  64.1 kg    Height:        Intake/Output Summary (Last 24 hours) at 06/02/2022 1011 Last data filed at 06/02/2022 0600 Gross per 24 hour  Intake 240 ml  Output 1900 ml  Net -1660 ml  Filed Weights   06/01/22 0405 06/02/22 0500  Weight: 59.9 kg 64.1 kg     Examination:     General:  AAO x 3,  cooperative, no distress; cachectic elderly male  HEENT:  Normocephalic, PERRL, otherwise with in Normal limits   Neuro:  CNII-XII intact. , normal motor and sensation, reflexes intact   Lungs:   Clear to auscultation BL, Respirations unlabored,  No wheezes / crackles  Cardio:    S1/S2, RRR, No murmure, No Rubs or Gallops   Abdomen:  Soft, non-tender, bowel sounds active all four quadrants, no guarding or peritoneal signs.  Muscular  skeletal:  Limited exam -global generalized weaknesses - in bed, able to move all 4 extremities,   2+ pulses,  symmetric, +2 pitting edema  Skin:  Dry, warm to touch, negative for any Rashes,  Wounds: Please see nursing documentation          ------------------------------------------------------------------------------------------------------------------------------------------    LABs:     Latest Ref Rng & Units 06/02/2022    2:23 AM 06/01/2022    5:14 AM 05/31/2022   11:46 AM  CBC  WBC 4.0 - 10.5 K/uL 11.5  8.8  9.4   Hemoglobin 13.0 - 17.0 g/dL 9.9  9.2  9.6   Hematocrit 39.0 - 52.0 % 30.0  28.5  29.9   Platelets 150 - 400 K/uL 308  323  357       Latest Ref Rng & Units 06/02/2022    2:23 AM 06/01/2022    6:31 PM 06/01/2022    5:14 AM  CMP  Glucose 70 - 99 mg/dL 113  114  117   BUN 8 - 23 mg/dL '17  17  16   '$ Creatinine 0.61 - 1.24 mg/dL 3.17  3.04  3.21   Sodium 135 - 145 mmol/L 126  128  127   Potassium 3.5 - 5.1 mmol/L 3.5  3.9  4.2   Chloride 98 - 111 mmol/L 88  92  90   CO2 22 - 32 mmol/L '26  25  26   '$ Calcium 8.9 - 10.3 mg/dL 8.3  8.0  8.1   Total Protein 6.5 - 8.1 g/dL 7.5   7.0   Total Bilirubin 0.3 - 1.2 mg/dL 0.7   0.9   Alkaline Phos 38 - 126 U/L 92   62   AST 15 - 41 U/L 25   17   ALT 0 - 44 U/L 10   9        Micro Results No results found for this or any previous visit (from the past 240 hour(s)).  Radiology Reports ECHOCARDIOGRAM COMPLETE  Result Date: 06/01/2022    ECHOCARDIOGRAM REPORT   Patient Name:   Rick [name] Date of Exam: 06/01/2022 Medical Rec #:  [id number]       Height:       67.0 in Accession #:    [id number]      Weight:       132.0 lb Date of Birth:  09/02/1928       BSA:          1.695 m Patient Age:    72 years        BP:           127/70 mmHg Patient Gender: M               HR:           89 bpm. Exam Location:  Inpatient Procedure: 2D Echo, Cardiac  Doppler and Color Doppler Indications:    CHF-Acute Diastolic W10.27  History:        Patient has prior history of Echocardiogram examinations, most                 recent 12/01/2020. CHF; Risk Factors:Hypertension and                 Dyslipidemia. TOBACCO ABUSE, HX OF, History of prostate cancer.  Sonographer:    Alvino Chapel RCS Referring Phys: [id number] OLADAPO ADEFESO IMPRESSIONS  1. LVEF 30-35% with global hypokinesis. Significant LV dyssynchrony present. Left ventricular ejection fraction, by estimation, is 30 to 35%. The left ventricle has moderately decreased function. The left ventricle demonstrates global hypokinesis. There  is mild concentric left ventricular hypertrophy. Indeterminate diastolic filling due to E-A fusion.  2. Right ventricular systolic function is normal. The right ventricular size is normal.  3. Left atrial size was moderately dilated.  4. Right atrial size was mildly dilated.  5. A small pericardial effusion is present. The pericardial effusion is anterior to the right ventricle.  6. Moderate to severe MR. Eccentric jet. The mitral valve is grossly normal. Moderate to severe mitral valve regurgitation. No evidence of mitral stenosis.  7. The aortic valve is tricuspid. There is moderate calcification of the aortic valve. There is moderate thickening of the aortic valve. Aortic valve regurgitation is moderate. Mild aortic valve stenosis. Comparison(s): Changes from prior study are noted. LVEF now 30-35%. Moderate to severe MR remains present. AI is moderate. Significant LV dyssynchrony present consistent with LBBB. FINDINGS  Left Ventricle: LVEF 30-35% with global hypokinesis. Significant LV dyssynchrony present. Left ventricular ejection fraction, by estimation, is 30 to 35%. The left ventricle has moderately decreased function. The left ventricle demonstrates global hypokinesis. The left ventricular internal cavity size was normal in size. There is mild concentric left ventricular hypertrophy. Abnormal (paradoxical) septal motion, consistent with left bundle branch block. Indeterminate diastolic filling due to E-A fusion. Right Ventricle: The right ventricular size is normal. No increase in right ventricular wall thickness. Right ventricular systolic function is normal. Left Atrium: Left atrial size was  moderately dilated. Right Atrium: Right atrial size was mildly dilated. Pericardium: A small pericardial effusion is present. The pericardial effusion is anterior to the right ventricle. Mitral Valve: Moderate to severe MR. Eccentric jet. The mitral valve is grossly normal. Moderate to severe mitral valve regurgitation. No evidence of mitral valve stenosis. Tricuspid Valve: The tricuspid valve is grossly normal. Tricuspid valve regurgitation is mild . No evidence of tricuspid stenosis. Aortic Valve: The aortic valve is tricuspid. There is moderate calcification of the aortic valve. There is moderate thickening of the aortic valve. Aortic valve regurgitation is moderate. Aortic regurgitation PHT measures 644 msec. Mild aortic stenosis is present. Aortic valve mean gradient measures 8.0 mmHg. Aortic valve peak gradient measures 14.7 mmHg. Aortic valve area, by VTI measures 1.40 cm. Pulmonic Valve: The pulmonic valve was grossly normal. Pulmonic valve regurgitation is mild. No evidence of pulmonic stenosis. Aorta: The aortic root is normal in size and structure. Venous: The inferior vena cava was not well visualized. IAS/Shunts: The atrial septum is grossly normal.  LEFT VENTRICLE PLAX 2D LVIDd:         5.40 cm      Diastology LVIDs:         4.60 cm      LV e' medial:    7.07 cm/s LV PW:         1.20  cm      LV E/e' medial:  13.0 LV IVS:        1.40 cm      LV e' lateral:   9.25 cm/s LVOT diam:     1.70 cm      LV E/e' lateral: 9.9 LV SV:         55 LV SV Index:   32 LVOT Area:     2.27 cm  LV Volumes (MOD) LV vol d, MOD A2C: 119.0 ml LV vol d, MOD A4C: 157.0 ml LV vol s, MOD A2C: 76.0 ml LV vol s, MOD A4C: 105.0 ml LV SV MOD A2C:     43.0 ml LV SV MOD A4C:     157.0 ml LV SV MOD BP:      49.3 ml RIGHT VENTRICLE RV S prime:     13.20 cm/s TAPSE (M-mode): 2.0 cm LEFT ATRIUM              Index        RIGHT ATRIUM           Index LA diam:        3.20 cm  1.89 cm/m   RA Area:     24.40 cm LA Vol (A2C):   120.0 ml 70.80  ml/m  RA Volume:   70.00 ml  41.30 ml/m LA Vol (A4C):   49.6 ml  29.26 ml/m LA Biplane Vol: 78.4 ml  46.26 ml/m  AORTIC VALVE AV Area (Vmax):    1.37 cm AV Area (Vmean):   1.49 cm AV Area (VTI):     1.40 cm AV Vmax:           192.00 cm/s AV Vmean:          122.000 cm/s AV VTI:            0.391 m AV Peak Grad:      14.7 mmHg AV Mean Grad:      8.0 mmHg LVOT Vmax:         116.00 cm/s LVOT Vmean:        80.200 cm/s LVOT VTI:          0.241 m LVOT/AV VTI ratio: 0.62 AI PHT:            644 msec  AORTA Ao Root diam: 3.30 cm MITRAL VALVE                  TRICUSPID VALVE MV Area (PHT): 5.38 cm       TR Peak grad:   24.0 mmHg MV Decel Time: 141 msec       TR Vmax:        245.00 cm/s MR Peak grad:    109.4 mmHg MR Mean grad:    57.0 mmHg    SHUNTS MR Vmax:         523.00 cm/s  Systemic VTI:  0.24 m MR Vmean:        340.0 cm/s   Systemic Diam: 1.70 cm MR PISA:         1.57 cm MR PISA Eff ROA: 9 mm MR PISA Radius:  0.50 cm MV E velocity: 91.70 cm/s Eleonore Chiquito MD Electronically signed by Eleonore Chiquito MD Signature Date/Time: 06/01/2022/4:29:52 PM    Final    US RENAL  Result Date: 06/01/2022 CLINICAL DATA:  Acute kidney injury. EXAM: RENAL / URINARY TRACT ULTRASOUND COMPLETE COMPARISON:  CT, 12/28/2021. FINDINGS: Right Kidney: Renal measurements: 24 x 12 x  22 cm = volume: 3,580 mL. Multiple large cysts. Largest cyst 26 cm. Minimal renal parenchyma. No convincing hydronephrosis. Left Kidney: Renal measurements: 23.2 x 11.9 x 11.5 cm = volume: 1,655 mL. Numerous cysts. Echogenic shadowing stone in the central kidney consistent with the large staghorn calculus noted on the prior CT. Largest 2 cysts, upper lower pole, 9.5 x 7.7 x 0.8 cm and 9.4 x 7.3 x 7.5 cm respectively. No solid masses. Limited renal parenchyma visualized. No convincing hydronephrosis. Bladder: Not visualized. Other: None. IMPRESSION: 1. Limited sonographic assessment of the kidneys due to the numerous cysts, large renal sizes and limited  parenchyma. 2. Overall, stable appearance compared to the CT from 12/28/2021. 3. Large shadowing left mid kidney stone consistent with a large staghorn calculus noted on the prior study. Electronically Signed   By: Lajean Manes M.D.   On: 06/01/2022 15:30    SIGNED: Deatra James, MD, FHM. [facility],  Pager (please use [email or web address] to page/text) Please use Epic Secure Chat for non-urgent communication (7AM-7PM)  If 7PM-7AM, please contact night-coverage [email or web address], 06/02/2022, 10:11 AM

## 2022-06-03 DIAGNOSIS — E876 Hypokalemia: Secondary | ICD-10-CM | POA: Diagnosis not present

## 2022-06-03 LAB — BASIC METABOLIC PANEL
Anion gap: 8 (ref 5–15)
BUN: 19 mg/dL (ref 8–23)
CO2: 31 mmol/L (ref 22–32)
Calcium: 7.9 mg/dL — ABNORMAL LOW (ref 8.9–10.3)
Chloride: 88 mmol/L — ABNORMAL LOW (ref 98–111)
Creatinine, Ser: 3.3 mg/dL — ABNORMAL HIGH (ref 0.61–1.24)
GFR, Estimated: 17 mL/min — ABNORMAL LOW (ref >60)
Glucose, Bld: 96 mg/dL (ref 70–99)
Potassium: 3.5 mmol/L (ref 3.5–5.1)
Sodium: 127 mmol/L — ABNORMAL LOW (ref 135–145)

## 2022-06-03 LAB — CBC
HCT: 24.9 % — ABNORMAL LOW (ref 39.0–52.0)
Hemoglobin: 8.4 g/dL — ABNORMAL LOW (ref 13.0–17.0)
MCH: 28.9 pg (ref 26.0–34.0)
MCHC: 33.7 g/dL (ref 30.0–36.0)
MCV: 85.6 fL (ref 80.0–100.0)
Platelets: 275 10*3/uL (ref 150–400)
RBC: 2.91 MIL/uL — ABNORMAL LOW (ref 4.22–5.81)
RDW: 16.1 % — ABNORMAL HIGH (ref 11.5–15.5)
WBC: 8.2 10*3/uL (ref 4.0–10.5)
nRBC: 0 % (ref 0.0–0.2)

## 2022-06-03 MED ORDER — MAGNESIUM SULFATE 2 GM/50ML IV SOLN
2.0000 g | Freq: Once | INTRAVENOUS | Status: AC
  Administered 2022-06-03: 2 g via INTRAVENOUS
  Filled 2022-06-03: qty 50

## 2022-06-03 MED ORDER — SODIUM CHLORIDE 1 G PO TABS
1.0000 g | ORAL_TABLET | Freq: Three times a day (TID) | ORAL | Status: DC
  Administered 2022-06-03 – 2022-06-09 (×21): 1 g via ORAL
  Filled 2022-06-03 (×20): qty 1

## 2022-06-03 MED ORDER — POTASSIUM CHLORIDE CRYS ER 20 MEQ PO TBCR: 40.0000 meq | EXTENDED_RELEASE_TABLET | Freq: Two times a day (BID) | ORAL | Status: DC

## 2022-06-03 MED ORDER — POTASSIUM CHLORIDE CRYS ER 20 MEQ PO TBCR
40.0000 meq | EXTENDED_RELEASE_TABLET | Freq: Once | ORAL | Status: AC
  Administered 2022-06-03: 40 meq via ORAL
  Filled 2022-06-03: qty 2

## 2022-06-03 NOTE — Care Management Important Message (Signed)
Important Message  Patient Details  Name: Rick [name] MRN: [id number] Date of Birth: 03-Dec-1928   Medicare Important Message Given:  Yes     Luree Palla [address] 06/03/2022, 3:37 PM

## 2022-06-03 NOTE — Progress Notes (Signed)
PROGRESS NOTE    Patient: Rick [name]                            PCP: Tonia Ghent, MD                    DOB: [birth year]            DOA: 05/31/2022 YBO:[id number]             DOS: 06/03/2022, 11:05 AM   LOS: 3 days   Date of Service: The patient was seen and examined on 06/03/2022  Subjective:   The patient was seen and examined this morning, stable no acute distress much more awake, following command,  His wife and daughter present at bedside-updated Blood pressure has been stable, electrolytes no major improvement    Brief Narrative:   Rick [name] is a 88 y.o. male with medical history significant of CKD stage IV secondary to polycystic kidney disease, hypertension, hyperlipidemia, hypothyroidism, GERD, prostate cancer status post prostatectomy who presented to the emergency department after being asked to go to the ED by his nephrologist (Dr. Candiss Norse).  Patient was unable to provide details regarding why he came to the ED other than that his kidney numbers were going up and was asked to go to the ED for further treatment.  Rest of the history was obtained from ED physician and ED medical record.  Per report, patient has had hypervolemia and decrease in sodium levels over the last couple of weeks, creatinine has been trending up since September, 2023 and sodium was downtrending.  Sodium level was 125 on 12/18 and he was advised to restart his Lasix by his nephrologist and he has been compliant with this, but sodium level decreased to 124 on 12/22 and creatinine continued to stay elevated.  He was asked to go to the emergency room for further evaluation.  Patient complained of 1 to 2 weeks of generalized weakness and some episodes of diarrhea, but denies any other symptoms.   ED Course:  Temperature was 97.3 F, respiratory rate 16/min, pulse was 73 bpm, BP was 142/89 and O2 sat was 93% on room air.  Workup in the ED showed normocytic anemia, sodium 128, potassium 3.0, chloride  89, bicarb 26, glucose 91, BUN 16, creatinine 3.32, albumin 2.4.  BNP 1180.5 Chest x-ray showed mild cardiomegaly and mild atelectasis or scarring at the left lung base IV Lasix 80 mg x 1 was given, potassium was replenished.  Hospitalist was asked to admit patient for further evaluation and management.    Assessment & Plan:   Principal Problem:   Hypokalemia Active Problems:   AKI (acute kidney injury) ([place]) superimposed on CKD stage IV   Essential hypertension   GERD without esophagitis   Anemia, iron deficiency   Hyponatremia   Hypoalbuminemia due to protein-calorie malnutrition (HCC)   Acquired hypothyroidism   BPH (benign prostatic hyperplasia)   Hyponatremia -Serum sodium 128, 127, 128, 127 This is possibly due to hypervolemia She was treated with IV Lasix 80 mg >>> switching to 40 twice a day due to worsening kidney function   -Continue sodium bicarbonate off 50 mg twice daily Continue to monitor sodium with serial BMPs Serum osmole low 267, urine osmole low 257, urine sodium 109 -Nephrology Dr. Hollie Salk consulted appreciate assistance  -Supplement sodium 128      Hypokalemia K+ is 3.0 >>> 4.2, 3.9, 3.5, 3.2 K+ - was  replenished orally Please monitor for AM K+ for further replenishmemnt   Acute kidney injury on CKD 4 BUN/creatinine at 16/3.32 (baseline creatinine at 2.3-2.6) Lab Results  Component Value Date   CREATININE 2.97 (H) 06/02/2022   CREATININE 3.17 (H) 06/02/2022   CREATININE 3.04 (H) 06/01/2022     Renally adjust medications, avoid nephrotoxic agents/dehydration/hypotension -Nephrology consulted, appreciate follow-up   Systolic congestive heart failure HFrEF -newly diagnosed -It seems that his systolic and diastolic heart failure -Currently stable-although now requiring 2 L of oxygen, maintaining O2 sat of 95% Echo: 12/01/2020 showed LVEF of 60 to 65%. severe asymmetric LVH.  Moderate was severe MR is present -Echocardiogram 06/01/2022:  Reviewed EJ EF of 30-35%, global hypokinesis, small pericardial effusion, moderate to severe MR, moderate AR, mild AS,  -Cardiology consulted: Appreciate further evaluation and recommendation -Per current recommendation continue Lasix IV, monitoring I's and O's, daily weight, - started metoprolol XL 25 mg, vital 20/37.5 mg 3 times daily, ARNI/MRA/SGLT2I given advanced renal disease    Chest x-ray showed mild cardiomegaly with mild atelectasis or scarring at the left lung base Continue total input/output, daily weights and fluid restriction Continue IV Lasix as indicated hyponatremia Continue heart healthy/renal diet          Hypoalbuminemia - secondary to moderate protein calorie malnutrition Albumin 2.4, protein supplement will be provided   Essential hypertension -BP stable, metoprolol, BiDIL was added by cardiology Continue Lasix   Acquired hypothyroidism Continue Synthroid   GERD -Will hold Protonix as it may be contributing to hyponatremia   Iron deficiency anemia Continue ferrous sulfate   BPH Continue Flomax         Consult : Nephrology /cardiology ------------------------------------------------------------------------------------------------------------------------------------- Nutritional status:  The patient's BMI is: Body mass index is 22.75 kg/m. I agree with the assessment and plan as outlined -------------------------------------------------------------------------------------------------------------------------------------  DVT prophylaxis:  heparin injection 5,000 Units Start: 06/01/22 0600 SCDs Start: 06/01/22 0240   Code Status:   Code Status: Full Code  Family Communication: Family present at bedside-updated The above findings and plan of care has been discussed with patient (and family)  in detail,  they expressed understanding and agreement of above. -Advance care planning has been discussed.   Admission status:   Status is:  Inpatient Remains inpatient appropriate because: Needing aggressive management for severe hyponatremia, hyperkalemia     Procedures:   No admission procedures for hospital encounter.   Antimicrobials:  Anti-infectives (From admission, onward)    None        Medication:   feeding supplement (GLUCERNA SHAKE)  237 mL Oral TID BM   ferrous sulfate  325 mg Oral Q breakfast   furosemide  40 mg Intravenous Q12H   heparin  5,000 Units Subcutaneous Q8H   isosorbide-hydrALAZINE  1 tablet Oral BID   latanoprost  1 drop Both Eyes QHS   levothyroxine  50 mcg Oral Q0600   metoprolol succinate  12.5 mg Oral Daily   sodium bicarbonate  1,300 mg Oral BID   sodium chloride  1 g Oral TID WC   tamsulosin  0.4 mg Oral Daily   timolol  1 drop Both Eyes Daily    acetaminophen **OR** acetaminophen, ondansetron **OR** ondansetron (ZOFRAN) IV   Objective:   Vitals:   06/02/22 2000 06/03/22 0400 06/03/22 0500 06/03/22 0757  BP: 132/76 131/76  134/87  Pulse: 80 69  85  Resp: 19 16  (!) 24  Temp: 97.6 F (36.4 C) 97.7 F (36.5 C)  97.7 F (36.5 C)  TempSrc:  Oral Oral  Oral  SpO2: 99% 97%  95%  Weight:   65.9 kg   Height:        Intake/Output Summary (Last 24 hours) at 06/03/2022 1105 Last data filed at 06/02/2022 1900 Gross per 24 hour  Intake 240 ml  Output --  Net 240 ml   Filed Weights   06/01/22 0405 06/02/22 0500 06/03/22 0500  Weight: 59.9 kg 64.1 kg 65.9 kg     Examination:     General:  AAO x 3,  cooperative, no distress; cachectic elderly male  HEENT:  Normocephalic, PERRL, otherwise with in Normal limits   Neuro:  CNII-XII intact. , normal motor and sensation, reflexes intact   Lungs:   Clear to auscultation BL, Respirations unlabored,  No wheezes / crackles  Cardio:    S1/S2, RRR, No murmure, No Rubs or Gallops   Abdomen:  Soft, non-tender, bowel sounds active all four quadrants, no guarding or peritoneal signs.  Muscular  skeletal:  Limited exam -global  generalized weaknesses - in bed, able to move all 4 extremities,   2+ pulses,  symmetric, No pitting edema  Skin:  Dry, warm to touch, negative for any Rashes,  Wounds: Please see nursing documentation        ------------------------------------------------------------------------------------------------------------------------------------    LABs:     Latest Ref Rng & Units 06/03/2022    6:59 AM 06/02/2022    2:23 AM 06/01/2022    5:14 AM  CBC  WBC 4.0 - 10.5 K/uL 8.2  11.5  8.8   Hemoglobin 13.0 - 17.0 g/dL 8.4  9.9  9.2   Hematocrit 39.0 - 52.0 % 24.9  30.0  28.5   Platelets 150 - 400 K/uL 275  308  323       Latest Ref Rng & Units 06/02/2022    2:56 PM 06/02/2022    2:23 AM 06/01/2022    6:31 PM  CMP  Glucose 70 - 99 mg/dL 124  113  114   BUN 8 - 23 mg/dL '17  17  17   '$ Creatinine 0.61 - 1.24 mg/dL 2.97  3.17  3.04   Sodium 135 - 145 mmol/L 127  126  128   Potassium 3.5 - 5.1 mmol/L 3.2  3.5  3.9   Chloride 98 - 111 mmol/L 90  88  92   CO2 22 - 32 mmol/L '27  26  25   '$ Calcium 8.9 - 10.3 mg/dL 7.1  8.3  8.0   Total Protein 6.5 - 8.1 g/dL  7.5    Total Bilirubin 0.3 - 1.2 mg/dL  0.7    Alkaline Phos 38 - 126 U/L  92    AST 15 - 41 U/L  25    ALT 0 - 44 U/L  10         Micro Results No results found for this or any previous visit (from the past 240 hour(s)).  Radiology Reports No results found.  SIGNED: Deatra James, MD, FHM. [facility],  Pager (please use [email or web address] to page/text) Please use Epic Secure Chat for non-urgent communication (7AM-7PM)  If 7PM-7AM, please contact night-coverage [email or web address], 06/03/2022, 11:05 AM

## 2022-06-03 NOTE — Progress Notes (Signed)
Rick [name] Progress Note   Subjective:   seen at bedside with daughter present.  Says feels back to baseline - daughter says face was fairly swollen and now better. No I/os yesterday or today.   Objective Vitals:   06/02/22 2000 06/03/22 0400 06/03/22 0500 06/03/22 0757  BP: 132/76 131/76  134/87  Pulse: 80 69  85  Resp: 19 16  (!) 24  Temp: 97.6 F (36.4 C) 97.7 F (36.5 C)  97.7 F (36.5 C)  TempSrc: Oral Oral  Oral  SpO2: 99% 97%  95%  Weight:   65.9 kg   Height:       Physical Exam General: elderly man eating vigorously Heart: RRR, no rub Lungs: clear Abdomen: thin Extremities: no edema GU purewick  Additional Objective Labs: Basic Metabolic Panel: Recent Labs  Lab 06/01/22 0514 06/01/22 1831 06/02/22 0223 06/02/22 1456  NA 127* 128* 126* 127*  K 4.2 3.9 3.5 3.2*  CL 90* 92* 88* 90*  CO2 '26 25 26 27  '$ GLUCOSE 117* 114* 113* 124*  BUN '16 17 17 17  '$ CREATININE 3.21* 3.04* 3.17* 2.97*  CALCIUM 8.1* 8.0* 8.3* 7.1*  PHOS 2.4*  --   --   --    Liver Function Tests: Recent Labs  Lab 05/31/22 1146 06/01/22 0514 06/02/22 0223  AST '19 17 25  '$ ALT '8 9 10  '$ ALKPHOS 73 62 92  BILITOT 1.0 0.9 0.7  PROT 7.9 7.0 7.5  ALBUMIN 2.4* 2.0* 2.5*   No results for input(s): "LIPASE", "AMYLASE" in the last 168 hours. CBC: Recent Labs  Lab 05/31/22 1146 06/01/22 0514 06/02/22 0223 06/03/22 0659  WBC 9.4 8.8 11.5* 8.2  NEUTROABS 8.2*  --   --   --   HGB 9.6* 9.2* 9.9* 8.4*  HCT 29.9* 28.5* 30.0* 24.9*  MCV 87.7 87.7 85.7 85.6  PLT 357 323 308 275   Blood Culture    Component Value Date/Time   SDES URINE, CLEAN CATCH 12/28/2021 0250   SPECREQUEST  12/28/2021 0250    NONE Performed at [facility] [address], [place], [place] [address]    CULT (A) 12/28/2021 0250    >=100,000 COLONIES/mL MULTIPLE SPECIES PRESENT, SUGGEST RECOLLECTION   REPTSTATUS 12/29/2021 FINAL 12/28/2021 0250    Cardiac Enzymes: No results for input(s):  "CKTOTAL", "CKMB", "CKMBINDEX", "TROPONINI" in the last 168 hours. CBG: No results for input(s): "GLUCAP" in the last 168 hours. Iron Studies: No results for input(s): "IRON", "TIBC", "TRANSFERRIN", "FERRITIN" in the last 72 hours. '@lablastinr3'$ @ Studies/Results: ECHOCARDIOGRAM COMPLETE  Result Date: 06/01/2022    ECHOCARDIOGRAM REPORT   Patient Name:   Rick [name] Date of Exam: 06/01/2022 Medical Rec #:  [id number]       Height:       67.0 in Accession #:    [id number]      Weight:       132.0 lb Date of Birth:  25-Dec-1928       BSA:          1.695 m Patient Age:    88 years        BP:           127/70 mmHg Patient Gender: M               HR:           89 bpm. Exam Location:  Inpatient Procedure: 2D Echo, Cardiac Doppler and Color Doppler Indications:    CHF-Acute Diastolic W54.62  History:        Patient has prior history of Echocardiogram examinations, most                 recent 12/01/2020. CHF; Risk Factors:Hypertension and                 Dyslipidemia. TOBACCO ABUSE, HX OF, History of prostate cancer.  Sonographer:    Alvino Chapel RCS Referring Phys: [id number] OLADAPO ADEFESO IMPRESSIONS  1. LVEF 30-35% with global hypokinesis. Significant LV dyssynchrony present. Left ventricular ejection fraction, by estimation, is 30 to 35%. The left ventricle has moderately decreased function. The left ventricle demonstrates global hypokinesis. There  is mild concentric left ventricular hypertrophy. Indeterminate diastolic filling due to E-A fusion.  2. Right ventricular systolic function is normal. The right ventricular size is normal.  3. Left atrial size was moderately dilated.  4. Right atrial size was mildly dilated.  5. A small pericardial effusion is present. The pericardial effusion is anterior to the right ventricle.  6. Moderate to severe MR. Eccentric jet. The mitral valve is grossly normal. Moderate to severe mitral valve regurgitation. No evidence of mitral stenosis.  7. The aortic valve is tricuspid.  There is moderate calcification of the aortic valve. There is moderate thickening of the aortic valve. Aortic valve regurgitation is moderate. Mild aortic valve stenosis. Comparison(s): Changes from prior study are noted. LVEF now 30-35%. Moderate to severe MR remains present. AI is moderate. Significant LV dyssynchrony present consistent with LBBB. FINDINGS  Left Ventricle: LVEF 30-35% with global hypokinesis. Significant LV dyssynchrony present. Left ventricular ejection fraction, by estimation, is 30 to 35%. The left ventricle has moderately decreased function. The left ventricle demonstrates global hypokinesis. The left ventricular internal cavity size was normal in size. There is mild concentric left ventricular hypertrophy. Abnormal (paradoxical) septal motion, consistent with left bundle branch block. Indeterminate diastolic filling due to E-A fusion. Right Ventricle: The right ventricular size is normal. No increase in right ventricular wall thickness. Right ventricular systolic function is normal. Left Atrium: Left atrial size was moderately dilated. Right Atrium: Right atrial size was mildly dilated. Pericardium: A small pericardial effusion is present. The pericardial effusion is anterior to the right ventricle. Mitral Valve: Moderate to severe MR. Eccentric jet. The mitral valve is grossly normal. Moderate to severe mitral valve regurgitation. No evidence of mitral valve stenosis. Tricuspid Valve: The tricuspid valve is grossly normal. Tricuspid valve regurgitation is mild . No evidence of tricuspid stenosis. Aortic Valve: The aortic valve is tricuspid. There is moderate calcification of the aortic valve. There is moderate thickening of the aortic valve. Aortic valve regurgitation is moderate. Aortic regurgitation PHT measures 644 msec. Mild aortic stenosis is present. Aortic valve mean gradient measures 8.0 mmHg. Aortic valve peak gradient measures 14.7 mmHg. Aortic valve area, by VTI measures 1.40 cm.  Pulmonic Valve: The pulmonic valve was grossly normal. Pulmonic valve regurgitation is mild. No evidence of pulmonic stenosis. Aorta: The aortic root is normal in size and structure. Venous: The inferior vena cava was not well visualized. IAS/Shunts: The atrial septum is grossly normal.  LEFT VENTRICLE PLAX 2D LVIDd:         5.40 cm      Diastology LVIDs:         4.60 cm      LV e' medial:    7.07 cm/s LV PW:         1.20 cm      LV E/e' medial:  13.0 LV  IVS:        1.40 cm      LV e' lateral:   9.25 cm/s LVOT diam:     1.70 cm      LV E/e' lateral: 9.9 LV SV:         55 LV SV Index:   32 LVOT Area:     2.27 cm  LV Volumes (MOD) LV vol d, MOD A2C: 119.0 ml LV vol d, MOD A4C: 157.0 ml LV vol s, MOD A2C: 76.0 ml LV vol s, MOD A4C: 105.0 ml LV SV MOD A2C:     43.0 ml LV SV MOD A4C:     157.0 ml LV SV MOD BP:      49.3 ml RIGHT VENTRICLE RV S prime:     13.20 cm/s TAPSE (M-mode): 2.0 cm LEFT ATRIUM              Index        RIGHT ATRIUM           Index LA diam:        3.20 cm  1.89 cm/m   RA Area:     24.40 cm LA Vol (A2C):   120.0 ml 70.80 ml/m  RA Volume:   70.00 ml  41.30 ml/m LA Vol (A4C):   49.6 ml  29.26 ml/m LA Biplane Vol: 78.4 ml  46.26 ml/m  AORTIC VALVE AV Area (Vmax):    1.37 cm AV Area (Vmean):   1.49 cm AV Area (VTI):     1.40 cm AV Vmax:           192.00 cm/s AV Vmean:          122.000 cm/s AV VTI:            0.391 m AV Peak Grad:      14.7 mmHg AV Mean Grad:      8.0 mmHg LVOT Vmax:         116.00 cm/s LVOT Vmean:        80.200 cm/s LVOT VTI:          0.241 m LVOT/AV VTI ratio: 0.62 AI PHT:            644 msec  AORTA Ao Root diam: 3.30 cm MITRAL VALVE                  TRICUSPID VALVE MV Area (PHT): 5.38 cm       TR Peak grad:   24.0 mmHg MV Decel Time: 141 msec       TR Vmax:        245.00 cm/s MR Peak grad:    109.4 mmHg MR Mean grad:    57.0 mmHg    SHUNTS MR Vmax:         523.00 cm/s  Systemic VTI:  0.24 m MR Vmean:        340.0 cm/s   Systemic Diam: 1.70 cm MR PISA:         1.57 cm MR  PISA Eff ROA: 9 mm MR PISA Radius:  0.50 cm MV E velocity: 91.70 cm/s Eleonore Chiquito MD Electronically signed by Eleonore Chiquito MD Signature Date/Time: 06/01/2022/4:29:52 PM    Final    US RENAL  Result Date: 06/01/2022 CLINICAL DATA:  Acute kidney injury. EXAM: RENAL / URINARY TRACT ULTRASOUND COMPLETE COMPARISON:  CT, 12/28/2021. FINDINGS: Right Kidney: Renal measurements: 24 x 12 x 22 cm = volume: 3,580 mL. Multiple large cysts. Largest cyst 26  cm. Minimal renal parenchyma. No convincing hydronephrosis. Left Kidney: Renal measurements: 23.2 x 11.9 x 11.5 cm = volume: 1,655 mL. Numerous cysts. Echogenic shadowing stone in the central kidney consistent with the large staghorn calculus noted on the prior CT. Largest 2 cysts, upper lower pole, 9.5 x 7.7 x 0.8 cm and 9.4 x 7.3 x 7.5 cm respectively. No solid masses. Limited renal parenchyma visualized. No convincing hydronephrosis. Bladder: Not visualized. Other: None. IMPRESSION: 1. Limited sonographic assessment of the kidneys due to the numerous cysts, large renal sizes and limited parenchyma. 2. Overall, stable appearance compared to the CT from 12/28/2021. 3. Large shadowing left mid kidney stone consistent with a large staghorn calculus noted on the prior study. Electronically Signed   By: Lajean Manes M.D.   On: 06/01/2022 15:30   Medications:  magnesium sulfate bolus IVPB 2 g (06/03/22 0918)    feeding supplement (GLUCERNA SHAKE)  237 mL Oral TID BM   ferrous sulfate  325 mg Oral Q breakfast   furosemide  40 mg Intravenous Q12H   heparin  5,000 Units Subcutaneous Q8H   isosorbide-hydrALAZINE  1 tablet Oral BID   latanoprost  1 drop Both Eyes QHS   levothyroxine  50 mcg Oral Q0600   metoprolol succinate  12.5 mg Oral Daily   sodium bicarbonate  1,300 mg Oral BID   sodium chloride  1 g Oral TID WC   tamsulosin  0.4 mg Oral Daily   timolol  1 drop Both Eyes Daily    Assessment/Plan  AKI on CKD: likely in the setting of volume overload -  IV Lasix 80 mg BID continue today with plans to switch to oral tomorrow - renal US to make sure no obstruction  - busy [place] with lots of cysts and staghorn calculi but read as stable c/w 12/2021; will check bladder scans today given not visualized on [place] 12/24. -Cr stable at 3 currently - no indication for RRT at present   2.  Hyponatremia:             - failed OP diuretics             - Lasix 80 IV BID - I/Os not recorded, continue today             - stagnant despite clinical improvement in volume status  - daily labs   3.  Hypokalemia:             - repleting   4.  Dispo: admitted  - needs PT eval, ordered   Jannifer Hick MD 06/03/2022, 10:02 AM  [name] Pager: [phone]

## 2022-06-03 NOTE — Progress Notes (Addendum)
Rounding Note    Patient Name: Rick [name] Date of Encounter: 06/03/2022  [place] Cardiologist: Dorris Carnes, MD   Subjective  No CP/SOB  Inpatient Medications    Scheduled Meds:  feeding supplement (GLUCERNA SHAKE)  237 mL Oral TID BM   ferrous sulfate  325 mg Oral Q breakfast   furosemide  40 mg Intravenous Q12H   heparin  5,000 Units Subcutaneous Q8H   isosorbide-hydrALAZINE  1 tablet Oral BID   latanoprost  1 drop Both Eyes QHS   levothyroxine  50 mcg Oral Q0600   metoprolol succinate  12.5 mg Oral Daily   sodium bicarbonate  1,300 mg Oral BID   sodium chloride  1 g Oral TID WC   tamsulosin  0.4 mg Oral Daily   timolol  1 drop Both Eyes Daily   Continuous Infusions:  PRN Meds: acetaminophen **OR** acetaminophen, ondansetron **OR** ondansetron (ZOFRAN) IV   Vital Signs    Vitals:   06/02/22 2000 06/03/22 0400 06/03/22 0500 06/03/22 0757  BP: 132/76 131/76  134/87  Pulse: 80 69  85  Resp: 19 16  (!) 24  Temp: 97.6 F (36.4 C) 97.7 F (36.5 C)  97.7 F (36.5 C)  TempSrc: Oral Oral  Oral  SpO2: 99% 97%  95%  Weight:   65.9 kg   Height:        Intake/Output Summary (Last 24 hours) at 06/03/2022 1027 Last data filed at 06/02/2022 1900 Gross per 24 hour  Intake 240 ml  Output --  Net 240 ml      06/03/2022    5:00 AM 06/02/2022    5:00 AM 06/01/2022    4:05 AM  Last 3 Weights  Weight (lbs) 145 lb 4.5 oz 141 lb 5 oz 132 lb  Weight (kg) 65.9 kg 64.1 kg 59.875 kg      Telemetry    Sinus rhythm with first degree AV block - Personally Reviewed  ECG    No new tracing  Physical Exam   GEN: No acute distress.   Neck: No JVD Cardiac: RRR, rubs, or gallops. Quiet systolic murmur over apex. Respiratory: Clear to auscultation bilaterally. GI: Soft, nontender, mild distention MS: No edema; No deformity. Neuro:  Nonfocal  Psych: Normal affect   Labs    High Sensitivity Troponin:  No results for input(s): "TROPONINIHS" in the  last 720 hours.   Chemistry Recent Labs  Lab 05/31/22 1146 06/01/22 0514 06/01/22 1831 06/02/22 0223 06/02/22 1456  NA 128* 127* 128* 126* 127*  K 3.0* 4.2 3.9 3.5 3.2*  CL 89* 90* 92* 88* 90*  CO2 '26 26 25 26 27  '$ GLUCOSE 91 117* 114* 113* 124*  BUN '16 16 17 17 17  '$ CREATININE 3.32* 3.21* 3.04* 3.17* 2.97*  CALCIUM 8.1* 8.1* 8.0* 8.3* 7.1*  MG  --  1.7  --   --   --   PROT 7.9 7.0  --  7.5  --   ALBUMIN 2.4* 2.0*  --  2.5*  --   AST 19 17  --  25  --   ALT 8 9  --  10  --   ALKPHOS 73 62  --  92  --   BILITOT 1.0 0.9  --  0.7  --   GFRNONAA 17* 18* 19* 18* 19*  ANIONGAP '13 11 11 12 10    '$ Lipids  Recent Labs  Lab 06/02/22 0223  CHOL 91  TRIG 68  HDL 40*  [place] 2.3    Hematology Recent Labs  Lab 06/01/22 0514 06/02/22 0223 06/03/22 0659  WBC 8.8 11.5* 8.2  RBC 3.25* 3.50* 2.91*  HGB 9.2* 9.9* 8.4*  HCT 28.5* 30.0* 24.9*  MCV 87.7 85.7 85.6  MCH 28.3 28.3 28.9  MCHC 32.3 33.0 33.7  RDW 15.6* 15.9* 16.1*  PLT 323 308 275   Thyroid No results for input(s): "TSH", "FREET4" in the last 168 hours.  BNP Recent Labs  Lab 05/31/22 2211  BNP 1,180.5*    DDimer No results for input(s): "DDIMER" in the last 168 hours.   Radiology    ECHOCARDIOGRAM COMPLETE  Result Date: 06/01/2022    ECHOCARDIOGRAM REPORT   Patient Name:   Rick [name] Date of Exam: 06/01/2022 Medical Rec #:  [id number]       Height:       67.0 in Accession #:    [id number]      Weight:       132.0 lb Date of Birth:  August 12, 1928       BSA:          1.695 m Patient Age:    88 years        BP:           127/70 mmHg Patient Gender: M               HR:           89 bpm. Exam Location:  Inpatient Procedure: 2D Echo, Cardiac Doppler and Color Doppler Indications:    CHF-Acute Diastolic J17.91  History:        Patient has prior history of Echocardiogram examinations, most                 recent 12/01/2020. CHF; Risk Factors:Hypertension and                 Dyslipidemia. TOBACCO ABUSE, HX OF,  History of prostate cancer.  Sonographer:    Alvino Chapel RCS Referring Phys: [id number] OLADAPO ADEFESO IMPRESSIONS  1. LVEF 30-35% with global hypokinesis. Significant LV dyssynchrony present. Left ventricular ejection fraction, by estimation, is 30 to 35%. The left ventricle has moderately decreased function. The left ventricle demonstrates global hypokinesis. There  is mild concentric left ventricular hypertrophy. Indeterminate diastolic filling due to E-A fusion.  2. Right ventricular systolic function is normal. The right ventricular size is normal.  3. Left atrial size was moderately dilated.  4. Right atrial size was mildly dilated.  5. A small pericardial effusion is present. The pericardial effusion is anterior to the right ventricle.  6. Moderate to severe MR. Eccentric jet. The mitral valve is grossly normal. Moderate to severe mitral valve regurgitation. No evidence of mitral stenosis.  7. The aortic valve is tricuspid. There is moderate calcification of the aortic valve. There is moderate thickening of the aortic valve. Aortic valve regurgitation is moderate. Mild aortic valve stenosis. Comparison(s): Changes from prior study are noted. LVEF now 30-35%. Moderate to severe MR remains present. AI is moderate. Significant LV dyssynchrony present consistent with LBBB. FINDINGS  Left Ventricle: LVEF 30-35% with global hypokinesis. Significant LV dyssynchrony present. Left ventricular ejection fraction, by estimation, is 30 to 35%. The left ventricle has moderately decreased function. The left ventricle demonstrates global hypokinesis. The left ventricular internal cavity size was normal in size. There is mild concentric left ventricular hypertrophy. Abnormal (paradoxical) septal motion, consistent with left bundle branch block. Indeterminate diastolic filling due to E-A fusion.  Right Ventricle: The right ventricular size is normal. No increase in right ventricular wall thickness. Right ventricular systolic  function is normal. Left Atrium: Left atrial size was moderately dilated. Right Atrium: Right atrial size was mildly dilated. Pericardium: A small pericardial effusion is present. The pericardial effusion is anterior to the right ventricle. Mitral Valve: Moderate to severe MR. Eccentric jet. The mitral valve is grossly normal. Moderate to severe mitral valve regurgitation. No evidence of mitral valve stenosis. Tricuspid Valve: The tricuspid valve is grossly normal. Tricuspid valve regurgitation is mild . No evidence of tricuspid stenosis. Aortic Valve: The aortic valve is tricuspid. There is moderate calcification of the aortic valve. There is moderate thickening of the aortic valve. Aortic valve regurgitation is moderate. Aortic regurgitation PHT measures 644 msec. Mild aortic stenosis is present. Aortic valve mean gradient measures 8.0 mmHg. Aortic valve peak gradient measures 14.7 mmHg. Aortic valve area, by VTI measures 1.40 cm. Pulmonic Valve: The pulmonic valve was grossly normal. Pulmonic valve regurgitation is mild. No evidence of pulmonic stenosis. Aorta: The aortic root is normal in size and structure. Venous: The inferior vena cava was not well visualized. IAS/Shunts: The atrial septum is grossly normal.  LEFT VENTRICLE PLAX 2D LVIDd:         5.40 cm      Diastology LVIDs:         4.60 cm      LV e' medial:    7.07 cm/s LV PW:         1.20 cm      LV E/e' medial:  13.0 LV IVS:        1.40 cm      LV e' lateral:   9.25 cm/s LVOT diam:     1.70 cm      LV E/e' lateral: 9.9 LV SV:         55 LV SV Index:   32 LVOT Area:     2.27 cm  LV Volumes (MOD) LV vol d, MOD A2C: 119.0 ml LV vol d, MOD A4C: 157.0 ml LV vol s, MOD A2C: 76.0 ml LV vol s, MOD A4C: 105.0 ml LV SV MOD A2C:     43.0 ml LV SV MOD A4C:     157.0 ml LV SV MOD BP:      49.3 ml RIGHT VENTRICLE RV S prime:     13.20 cm/s TAPSE (M-mode): 2.0 cm LEFT ATRIUM              Index        RIGHT ATRIUM           Index LA diam:        3.20 cm  1.89 cm/m    RA Area:     24.40 cm LA Vol (A2C):   120.0 ml 70.80 ml/m  RA Volume:   70.00 ml  41.30 ml/m LA Vol (A4C):   49.6 ml  29.26 ml/m LA Biplane Vol: 78.4 ml  46.26 ml/m  AORTIC VALVE AV Area (Vmax):    1.37 cm AV Area (Vmean):   1.49 cm AV Area (VTI):     1.40 cm AV Vmax:           192.00 cm/s AV Vmean:          122.000 cm/s AV VTI:            0.391 m AV Peak Grad:      14.7 mmHg AV Mean Grad:  8.0 mmHg LVOT Vmax:         116.00 cm/s LVOT Vmean:        80.200 cm/s LVOT VTI:          0.241 m LVOT/AV VTI ratio: 0.62 AI PHT:            644 msec  AORTA Ao Root diam: 3.30 cm MITRAL VALVE                  TRICUSPID VALVE MV Area (PHT): 5.38 cm       TR Peak grad:   24.0 mmHg MV Decel Time: 141 msec       TR Vmax:        245.00 cm/s MR Peak grad:    109.4 mmHg MR Mean grad:    57.0 mmHg    SHUNTS MR Vmax:         523.00 cm/s  Systemic VTI:  0.24 m MR Vmean:        340.0 cm/s   Systemic Diam: 1.70 cm MR PISA:         1.57 cm MR PISA Eff ROA: 9 mm MR PISA Radius:  0.50 cm MV E velocity: 91.70 cm/s Eleonore Chiquito MD Electronically signed by Eleonore Chiquito MD Signature Date/Time: 06/01/2022/4:29:52 PM    Final    US RENAL  Result Date: 06/01/2022 CLINICAL DATA:  Acute kidney injury. EXAM: RENAL / URINARY TRACT ULTRASOUND COMPLETE COMPARISON:  CT, 12/28/2021. FINDINGS: Right Kidney: Renal measurements: 24 x 12 x 22 cm = volume: 3,580 mL. Multiple large cysts. Largest cyst 26 cm. Minimal renal parenchyma. No convincing hydronephrosis. Left Kidney: Renal measurements: 23.2 x 11.9 x 11.5 cm = volume: 1,655 mL. Numerous cysts. Echogenic shadowing stone in the central kidney consistent with the large staghorn calculus noted on the prior CT. Largest 2 cysts, upper lower pole, 9.5 x 7.7 x 0.8 cm and 9.4 x 7.3 x 7.5 cm respectively. No solid masses. Limited renal parenchyma visualized. No convincing hydronephrosis. Bladder: Not visualized. Other: None. IMPRESSION: 1. Limited sonographic assessment of the kidneys due to  the numerous cysts, large renal sizes and limited parenchyma. 2. Overall, stable appearance compared to the CT from 12/28/2021. 3. Large shadowing left mid kidney stone consistent with a large staghorn calculus noted on the prior study. Electronically Signed   By: Lajean Manes M.D.   On: 06/01/2022 15:30    Cardiac Studies   Echo 06/01/22:    1. LVEF 30-35% with global hypokinesis. Significant LV dyssynchrony  present. Left ventricular ejection fraction, by estimation, is 30 to 35%.  The left ventricle has moderately decreased function. The left ventricle  demonstrates global hypokinesis. There   is mild concentric left ventricular hypertrophy. Indeterminate diastolic  filling due to E-A fusion.   2. Right ventricular systolic function is normal. The right ventricular  size is normal.   3. Left atrial size was moderately dilated.   4. Right atrial size was mildly dilated.   5. A small pericardial effusion is present. The pericardial effusion is  anterior to the right ventricle.   6. Moderate to severe MR. Eccentric jet. The mitral valve is grossly  normal. Moderate to severe mitral valve regurgitation. No evidence of  mitral stenosis.   7. The aortic valve is tricuspid. There is moderate calcification of the  aortic valve. There is moderate thickening of the aortic valve. Aortic  valve regurgitation is moderate. Mild aortic valve stenosis.   Comparison(s): Changes from prior study are  noted. LVEF now 30-35%.  Moderate to severe MR remains present. AI is moderate. Significant LV  dyssynchrony present consistent with LBBB.      Echo from 12/01/20:    1. Moderate to severe mitral regurgitation is present, likely severe. The  jet is eccentric and anteriorly directed. There is likely pulmonary vein  flow reversal in systole in the right inferior pulmonary vein. There is  splay artifact present, indicating   at least moderate severity. The jet appears to wrap around the LA,  indicative  of severe MR. RVSP elevated. There is no obvious flail segment,  but suspect this is not well seen. Would recommend a TEE for  characterization of the MR. The mitral valve is  grossly normal. Moderate to severe mitral valve regurgitation. No evidence  of mitral stenosis.   2. Left ventricular ejection fraction, by estimation, is 60 to 65%. The  left ventricle has normal function. The left ventricle has no regional  wall motion abnormalities. There is severe asymmetric left ventricular  hypertrophy of the basal-septal  segment. Indeterminate diastolic filling due to E-A fusion.   3. Right ventricular systolic function is normal. The right ventricular  size is normal. There is mildly elevated pulmonary artery systolic  pressure. The estimated right ventricular systolic pressure is 40.9 mmHg.   4. The aortic valve is tricuspid. Aortic valve regurgitation is mild to  moderate. Mild aortic valve sclerosis is present, with no evidence of  aortic valve stenosis.   5. The inferior vena cava is normal in size with greater than 50%  respiratory variability, suggesting right atrial pressure of 3 mmHg.   6. There is a large cystic structure located inferior to the liver, which  could represent a cyst. Would recommend dedicated liver imaging.   Patient Profile     Rick [name] is a 88 y.o. 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   Assessment & Plan    Acute combined systolic and diastolic heart failure  Patient admitted in the setting of volume overload, chronic hyponatremia and AKI on CKD IV. BNP 1180. 06/01/22 TTE with LVEF 30-35%, global hypokinesis, normal RV, mod LAE, mild RAE, small  pericardial effusion, and moderate to severe MR, moderate AO, mild AS. LVEF down from normal on 12/01/20 TTE. No historical ischemic evaluation with renal failure.   Continue diuresis per nephrology. Net negative -2.095L.  Initiated on Metoprolol Succinate '25mg'$ , Bidil 20/37.'5mg'$  TID yesterday, tolerating well. No ARNI/MRA/SGLT2i with advanced CKD.  Lipid panel without elevation of LDL or triglycerides. Given that patient would not be a good candidate for intervention, would not recommend ischemic workup with stress testing at this time. Recommend medical management at this time.    Moderate to Severe MR  TTE this admission with stable moderate to severe MR, eccentric jet.   Medical management at this time with close outpatient follow up.    Hypertension  BP improved with initiation of Metoprolol Succinate and Bidil. Dose was reduced after low BP noted. BP now normal/hypertensive range again.   AKI on CKD IV  Creatinine stable this morning at      For questions or updates, please contact [place] Please consult [email or web address] for contact info under  SignedLily Kocher, PA-C  06/03/2022, 10:27 AM    Patient seen and examined with Lily Kocher PA-C.  Agree as above, with the following exceptions and changes as noted below. Feeling well with no active SOB.. Gen: NAD, CV: RRR, distant heart sounds, 3/6 HSM apex Lungs: clear, Abd: soft, Extrem: Warm, well perfused, no edema, thin LE, Neuro/Psych: alert and oriented x 3, normal mood and affect. All available labs, radiology testing, previous records reviewed. Diuresis guided by nephrology. Agree as above with currently HF therapy. Limited options for treatment of HF with CKD and this also complicates decisions around MR. Would treat medically for now, family in agreement after discussion with consult team yesterday and today.  Elouise Munroe, MD 06/03/22 12:43 PM

## 2022-06-04 DIAGNOSIS — Z515 Encounter for palliative care: Secondary | ICD-10-CM

## 2022-06-04 DIAGNOSIS — E876 Hypokalemia: Secondary | ICD-10-CM | POA: Diagnosis not present

## 2022-06-04 DIAGNOSIS — N184 Chronic kidney disease, stage 4 (severe): Secondary | ICD-10-CM

## 2022-06-04 DIAGNOSIS — I5041 Acute combined systolic (congestive) and diastolic (congestive) heart failure: Secondary | ICD-10-CM

## 2022-06-04 DIAGNOSIS — Z7189 Other specified counseling: Secondary | ICD-10-CM

## 2022-06-04 DIAGNOSIS — E871 Hypo-osmolality and hyponatremia: Secondary | ICD-10-CM | POA: Diagnosis not present

## 2022-06-04 DIAGNOSIS — N179 Acute kidney failure, unspecified: Secondary | ICD-10-CM | POA: Diagnosis not present

## 2022-06-04 LAB — CBC
HCT: 24.9 % — ABNORMAL LOW (ref 39.0–52.0)
Hemoglobin: 8 g/dL — ABNORMAL LOW (ref 13.0–17.0)
MCH: 27.8 pg (ref 26.0–34.0)
MCHC: 32.1 g/dL (ref 30.0–36.0)
MCV: 86.5 fL (ref 80.0–100.0)
Platelets: 257 10*3/uL (ref 150–400)
RBC: 2.88 MIL/uL — ABNORMAL LOW (ref 4.22–5.81)
RDW: 16.2 % — ABNORMAL HIGH (ref 11.5–15.5)
WBC: 9 10*3/uL (ref 4.0–10.5)
nRBC: 0 % (ref 0.0–0.2)

## 2022-06-04 LAB — BASIC METABOLIC PANEL
Anion gap: 13 (ref 5–15)
BUN: 21 mg/dL (ref 8–23)
CO2: 27 mmol/L (ref 22–32)
Calcium: 8 mg/dL — ABNORMAL LOW (ref 8.9–10.3)
Chloride: 88 mmol/L — ABNORMAL LOW (ref 98–111)
Creatinine, Ser: 3.49 mg/dL — ABNORMAL HIGH (ref 0.61–1.24)
GFR, Estimated: 16 mL/min — ABNORMAL LOW (ref >60)
Glucose, Bld: 92 mg/dL (ref 70–99)
Potassium: 3.5 mmol/L (ref 3.5–5.1)
Sodium: 128 mmol/L — ABNORMAL LOW (ref 135–145)

## 2022-06-04 MED ORDER — DARBEPOETIN ALFA 60 MCG/0.3ML IJ SOSY: 60.0000 ug | PREFILLED_SYRINGE | INTRAMUSCULAR | Status: DC

## 2022-06-04 MED ORDER — FUROSEMIDE 40 MG PO TABS
40.0000 mg | ORAL_TABLET | Freq: Two times a day (BID) | ORAL | Status: DC
  Administered 2022-06-05: 40 mg via ORAL
  Filled 2022-06-04: qty 1

## 2022-06-04 NOTE — Plan of Care (Signed)

## 2022-06-04 NOTE — Progress Notes (Signed)
Transition of Care Department [facility]) following patient for high risk of readmission.   Patient is 88 year old presented to [facility] on 05/31/22 with hypokalemia, hyponatremia, and AKI. Pt also found to have acute combined systolic and diastolic heart failure.   Transition of Care Department [facility]) has reviewed patient and we will continue to monitor patient advancement through interdisciplinary progression rounds.

## 2022-06-04 NOTE — Progress Notes (Signed)
[facility]                                                                               Rick [name], is a 88 y.o. male, DOB - [birth year], RUE:[id number] Admit date - 05/31/2022    Outpatient Primary MD for the patient is Tonia Ghent, MD  LOS - 4  days    Brief summary   Rick [name] is a 88 y.o. male with medical history significant of CKD stage IV secondary to polycystic kidney disease, hypertension, hyperlipidemia, hypothyroidism, GERD, prostate cancer status post prostatectomy who presented to the emergency department after being asked to go to the ED by his nephrologist (Dr. Candiss Norse).  He was found to be in acute systolic heart failure and AKI.    Assessment & Plan:   Principal Problem:   Hypokalemia Active Problems:   AKI (acute kidney injury) ([place]) superimposed on CKD stage IV   Essential hypertension   GERD without esophagitis   Anemia, iron deficiency   Hyponatremia   Hypoalbuminemia due to protein-calorie malnutrition (HCC)   Acquired hypothyroidism   BPH (benign prostatic hyperplasia)   Hyponatremia -Serum sodium 128, 127, 128, 127 This is possibly due to hypervolemia  Mild improvement to sodium with diuresis. Patient asymptomatic.    Hypokalemia Replaced.    Acute kidney injury on CKD 4 Creatinine improving/ stabilized with diuresis.  US renal shows multiple cysts.  Nephrology on board and recommended transition to oral lasix today.  Continue with sodium bicarbonate.   Systolic congestive heart failure HFrEF -newly diagnosed -It seems that his systolic and diastolic heart failure -Currently stable-although now requiring 2 L of oxygen, maintaining O2 sat of 95% -Echocardiogram 06/01/2022: Reviewed EJ EF of 30-35%, global hypokinesis, small pericardial effusion, moderate to severe MR, moderate AR, mild AS, - cardiology on board.  - diuresed appropriately.  - continue with strict intake and output, daily weights.  - transitioned to  oral lasix 40 mg BID.  - meanwhile continue with BIDIL 1 TAB bid, metoprolol 125 mg daily.  - Continue heart healthy/renal diet          Hypoalbuminemia - secondary to moderate protein calorie malnutrition Albumin 2.4, protein supplement will be provided   Essential hypertension  Well controlled.    Acquired hypothyroidism Continue Synthroid   GERD Stable.    Iron deficiency anemia Continue ferrous sulfate   BPH Continue Flomax  Anemia of chronic disease:  Hemoglobin slowly trending down from 9.2 to 8.  Continue to monitor and transfuse to keep hemoglobin greater than 7.     Code Status: full code.  DVT Prophylaxis:  heparin injection 5,000 Units Start: 06/01/22 0600 SCDs Start: 06/01/22 0240   Level of Care: Level of care: Progressive Family Communication: family at bedside.   Disposition Plan:     Remains inpatient appropriate:  diuresis with lasix.   Procedures:  None.   Consultants:   Cardiology  Nephrology.   Antimicrobials:   Anti-infectives (From admission, onward)    None        Medications  Scheduled Meds:  [START ON 06/09/2022] darbepoetin (ARANESP) injection - NON-DIALYSIS  60 mcg Subcutaneous Q Mon-1800  feeding supplement (GLUCERNA SHAKE)  237 mL Oral TID BM   ferrous sulfate  325 mg Oral Q breakfast   [START ON 06/05/2022] furosemide  40 mg Oral BID   heparin  5,000 Units Subcutaneous Q8H   isosorbide-hydrALAZINE  1 tablet Oral BID   latanoprost  1 drop Both Eyes QHS   levothyroxine  50 mcg Oral Q0600   metoprolol succinate  12.5 mg Oral Daily   sodium bicarbonate  1,300 mg Oral BID   sodium chloride  1 g Oral TID WC   tamsulosin  0.4 mg Oral Daily   timolol  1 drop Both Eyes Daily   Continuous Infusions: PRN Meds:.acetaminophen **OR** acetaminophen, ondansetron **OR** ondansetron (ZOFRAN) IV    Subjective:   Rick [name] was seen and examined today.  No chest pain or sob. No cough.   Objective:   Vitals:   06/04/22  0500 06/04/22 1200 06/04/22 1500 06/04/22 1600  BP:  (!) 170/86  (!) 148/80  Pulse:  78 65 62  Resp:   15 (!) 22  Temp:      TempSrc:      SpO2:  95% 97% 97%  Weight: 66 kg     Height:        Intake/Output Summary (Last 24 hours) at 06/04/2022 1943 Last data filed at 06/04/2022 1800 Gross per 24 hour  Intake 840 ml  Output 550 ml  Net 290 ml   Filed Weights   06/02/22 0500 06/03/22 0500 06/04/22 0500  Weight: 64.1 kg 65.9 kg 66 kg     Exam General exam: Appears calm and comfortable  Respiratory system: Clear to auscultation. Respiratory effort normal. Cardiovascular system: S1 & S2 heard, RRR. No JVD, murmurs,  Gastrointestinal system: Abdomen is nondistended, soft and nontender.  Central nervous system: Alert and oriented. No focal neurological deficits. Extremities: Symmetric 5 x 5 power. Skin: No rashes, lesions or ulcers Psychiatry:  Mood & affect appropriate.     Data Reviewed:  I have personally reviewed following labs and imaging studies   CBC Lab Results  Component Value Date   WBC 9.0 06/04/2022   RBC 2.88 (L) 06/04/2022   HGB 8.0 (L) 06/04/2022   HCT 24.9 (L) 06/04/2022   MCV 86.5 06/04/2022   MCH 27.8 06/04/2022   PLT 257 06/04/2022   MCHC 32.1 06/04/2022   RDW 16.2 (H) 06/04/2022   LYMPHSABS 0.6 (L) 05/31/2022   MONOABS 0.5 05/31/2022   EOSABS 0.2 05/31/2022   BASOSABS 0.0 19/16/6060     Last metabolic panel Lab Results  Component Value Date   NA 128 (L) 06/04/2022   K 3.5 06/04/2022   CL 88 (L) 06/04/2022   CO2 27 06/04/2022   BUN 21 06/04/2022   CREATININE 3.49 (H) 06/04/2022   GLUCOSE 92 06/04/2022   GFRNONAA 16 (L) 06/04/2022   GFRAA 49 (L) 01/11/2015   CALCIUM 8.0 (L) 06/04/2022   PHOS 2.4 (L) 06/01/2022   PROT 7.5 06/02/2022   ALBUMIN 2.5 (L) 06/02/2022   LABGLOB 5.2 (H) 08/19/2021   AGRATIO 0.6 (L) 08/19/2021   BILITOT 0.7 06/02/2022   ALKPHOS 92 06/02/2022   AST 25 06/02/2022   ALT 10 06/02/2022   ANIONGAP 13  06/04/2022    CBG (last 3)  No results for input(s): "GLUCAP" in the last 72 hours.    Coagulation Profile: No results for input(s): "INR", "PROTIME" in the last 168 hours.   Radiology Studies: No results found.     Hosie Poisson M.D. [facility] 06/04/2022, 7:43 PM  Available via Epic secure chat 7am-7pm After 7 pm, please refer to night coverage provider listed on amion.

## 2022-06-04 NOTE — Progress Notes (Signed)
[facility] Progress Note   Subjective:   seen at bedside with daughter present.  Working with PT - did great with RW  Says feels back to baseline.  UOP recorded as 378m yesterday, already 2264mtoday.    Objective Vitals:   06/03/22 2330 06/04/22 0356 06/04/22 0500 06/04/22 1200  BP: 100/68 119/67  (!) 170/86  Pulse: 72 67  78  Resp: 20 16    Temp: 98.7 F (37.1 C) 98.9 F (37.2 C)    TempSrc: Oral Oral    SpO2: 95% 96%  95%  Weight:   66 kg   Height:       Physical Exam General: elderly man walking with PT Heart: RRR, no rub Lungs: clear Abdomen: thin Extremities: no edema GU purewick  Additional Objective Labs: Basic Metabolic Panel: Recent Labs  Lab 06/01/22 0514 06/01/22 1831 06/02/22 1456 06/03/22 1058 06/04/22 0115  NA 127*   < > 127* 127* 128*  K 4.2   < > 3.2* 3.5 3.5  CL 90*   < > 90* 88* 88*  CO2 26   < > '27 31 27  '$ GLUCOSE 117*   < > 124* 96 92  BUN 16   < > '17 19 21  '$ CREATININE 3.21*   < > 2.97* 3.30* 3.49*  CALCIUM 8.1*   < > 7.1* 7.9* 8.0*  PHOS 2.4*  --   --   --   --    < > = values in this interval not displayed.    Liver Function Tests: Recent Labs  Lab 05/31/22 1146 06/01/22 0514 06/02/22 0223  AST '19 17 25  '$ ALT '8 9 10  '$ ALKPHOS 73 62 92  BILITOT 1.0 0.9 0.7  PROT 7.9 7.0 7.5  ALBUMIN 2.4* 2.0* 2.5*    No results for input(s): "LIPASE", "AMYLASE" in the last 168 hours. CBC: Recent Labs  Lab 05/31/22 1146 06/01/22 0514 06/02/22 0223 06/03/22 0659 06/04/22 0115  WBC 9.4 8.8 11.5* 8.2 9.0  NEUTROABS 8.2*  --   --   --   --   HGB 9.6* 9.2* 9.9* 8.4* 8.0*  HCT 29.9* 28.5* 30.0* 24.9* 24.9*  MCV 87.7 87.7 85.7 85.6 86.5  PLT 357 323 308 275 257    Blood Culture    Component Value Date/Time   SDES URINE, CLEAN CATCH 12/28/2021 0250   SPECREQUEST  12/28/2021 0250    NONE Performed at [facility] [place] [id number]  CULT (A) 12/28/2021 0250    >=100,000 COLONIES/mL  MULTIPLE SPECIES PRESENT, SUGGEST RECOLLECTION   REPTSTATUS 12/29/2021 FINAL 12/28/2021 0250    Cardiac Enzymes: No results for input(s): "CKTOTAL", "CKMB", "CKMBINDEX", "TROPONINI" in the last 168 hours. CBG: No results for input(s): "GLUCAP" in the last 168 hours. Iron Studies: No results for input(s): "IRON", "TIBC", "TRANSFERRIN", "FERRITIN" in the last 72 hours. '@lablastinr3'$ @ Studies/Results: No results found. Medications:    feeding supplement (GLUCERNA SHAKE)  237 mL Oral TID BM   ferrous sulfate  325 mg Oral Q breakfast   heparin  5,000 Units Subcutaneous Q8H   isosorbide-hydrALAZINE  1 tablet Oral BID   latanoprost  1 drop Both Eyes QHS   levothyroxine  50 mcg Oral Q0600   metoprolol succinate  12.5 mg Oral Daily   sodium bicarbonate  1,300 mg Oral BID   sodium chloride  1 g Oral TID WC   tamsulosin  0.4 mg Oral Daily   timolol  1  drop Both Eyes Daily    Assessment/Plan  AKI on CKD: likely in the setting of volume overload; baseline Cr ~2.5, up to 3.5 in setting of diuresis - IV Lasix 80 mg BID hold after AM dose today and plans to switch to oral tomorrow - appears was on lasix 40 BID prior to admission - resume and see how he does after we added na/fluid restriction - renal US to make sure no obstruction  - busy [place] with lots of cysts and staghorn calculi but read as stable c/w 12/2021; will check bladder scans today given not visualized on [place] 12/24. - no indication for RRT at present   2.  Hyponatremia:             - failed OP diuretics             - Lasix 80 IV BID - 2nd dose today then switch to po             - slightly improved to 128 despite clinical improvement in volume status  - daily labs   3.  Hypokalemia:             - repleted  4.  Anemia:  Hb 8. 12/18 iron sat 31%; ESA being arranged - will dose here.    4.  Dispo: admitted  - PT said looks good.  Pall has been consulted.  Hopefully he can go home soon.   Jannifer Hick MD 06/04/2022, 2:38 PM   [facility] Pager: [phone]

## 2022-06-04 NOTE — Evaluation (Signed)
Physical Therapy Evaluation Patient Details Name: Rick [name] MRN: [id number] DOB: 11/28/1928 Today's Date: 06/04/2022  History of Present Illness  Pt is 88 year old presented to [facility] on  05/31/22 with hypokalemia, hyponatremia, and AKI. Pt also found to have acute combined systolic and diastolic heart failure. PMH - CKD, moderate to severe MR, LVH, pulmonary hypertension, HTN, prostate CA, pelvic fx  Clinical Impression  Pt presents to PT with slightly unsteady gait due to illness and inactivity. Expect pt will make good progress back to baseline with mobility. Will follow acutely but doubt pt will need PT after DC. Should be ready for dc from PT standpoint when pt medically ready.         Recommendations for follow up therapy are one component of a multi-disciplinary discharge planning process, led by the attending physician.  Recommendations may be updated based on patient status, additional functional criteria and insurance authorization.  Follow Up Recommendations No PT follow up      Assistance Recommended at Discharge Intermittent Supervision/Assistance  Patient can return home with the following  A little help with walking and/or transfers;A little help with bathing/dressing/bathroom    Equipment Recommendations None recommended by PT  Recommendations for Other Services       Functional Status Assessment Patient has had a recent decline in their functional status and demonstrates the ability to make significant improvements in function in a reasonable and predictable amount of time.     Precautions / Restrictions Precautions Precautions: Fall Restrictions Weight Bearing Restrictions: No      Mobility  Bed Mobility Overal bed mobility: Needs Assistance Bed Mobility: Supine to Sit     Supine to sit: Supervision, HOB elevated     General bed mobility comments: Incr time and effort but no physical assist    Transfers Overall transfer level: Needs  assistance Equipment used: Rollator (4 wheels) Transfers: Sit to/from Stand Sit to Stand: Min assist           General transfer comment: Assist to initially steady on standing    Ambulation/Gait Ambulation/Gait assistance: Min guard, Supervision Gait Distance (Feet): 225 Feet Assistive device: Rollator (4 wheels) Gait Pattern/deviations: Step-through pattern, Decreased stride length, Trunk flexed Gait velocity: decr Gait velocity interpretation: 1.31 - 2.62 ft/sec, indicative of limited community ambulator   General Gait Details: Assist for safety. Pt progressing from min guard to supervision  Stairs            Wheelchair Mobility    Modified Rankin (Stroke Patients Only)       Balance Overall balance assessment: Needs assistance Sitting-balance support: No upper extremity supported, Feet supported Sitting balance-Leahy Scale: Good     Standing balance support: Bilateral upper extremity supported, During functional activity Standing balance-Leahy Scale: Poor Standing balance comment: rollator and min guard for static standing                             Pertinent Vitals/Pain Pain Assessment Pain Assessment: No/denies pain    Home Living Family/patient expects to be discharged to:: Private residence Living Arrangements: Spouse/significant other Available Help at Discharge: Family;Available 24 hours/day Type of Home: House Home Access: Stairs to enter Entrance Stairs-Rails: Right Entrance Stairs-Number of Steps: 2   Home Layout: One level Home Equipment: Cane - single point;Rollator (4 wheels)      Prior Function Prior Level of Function : Independent/Modified Independent  Mobility Comments: amb with rollator       Hand Dominance   Dominant Hand: Right    Extremity/Trunk Assessment   Upper Extremity Assessment Upper Extremity Assessment: Defer to OT evaluation    Lower Extremity Assessment Lower Extremity  Assessment: Generalized weakness       Communication   Communication: HOH  Cognition Arousal/Alertness: Awake/alert Behavior During Therapy: WFL for tasks assessed/performed Overall Cognitive Status: Within Functional Limits for tasks assessed                                          General Comments General comments (skin integrity, edema, etc.): VSS on RA. Left O2 off with SpO2 >92% on RA with activity    Exercises     Assessment/Plan    PT Assessment Patient needs continued PT services  PT Problem List Decreased strength;Decreased mobility;Decreased balance       PT Treatment Interventions DME instruction;Gait training;Functional mobility training;Therapeutic activities;Therapeutic exercise;Balance training;Patient/family education    PT Goals (Current goals can be found in the Care Plan section)  Acute Rehab PT Goals Patient Stated Goal: return home PT Goal Formulation: With patient/family Time For Goal Achievement: 06/18/22 Potential to Achieve Goals: Good    Frequency Min 2X/week     Co-evaluation               AM-PAC PT "6 Clicks" Mobility  Outcome Measure Help needed turning from your back to your side while in a flat bed without using bedrails?: None Help needed moving from lying on your back to sitting on the side of a flat bed without using bedrails?: A Little Help needed moving to and from a bed to a chair (including a wheelchair)?: A Little Help needed standing up from a chair using your arms (e.g., wheelchair or bedside chair)?: A Little Help needed to walk in hospital room?: A Little Help needed climbing 3-5 steps with a railing? : A Little 6 Click Score: 19    End of Session Equipment Utilized During Treatment: Gait belt Activity Tolerance: Patient tolerated treatment well Patient left: in chair;with call bell/phone within reach;with chair alarm set;with family/visitor present Nurse Communication: Mobility status;Other  (comment) (Left O2 off.) PT Visit Diagnosis: Other abnormalities of gait and mobility (R26.89);Muscle weakness (generalized) (M62.81)    Time: 1610-9604 PT Time Calculation (min) (ACUTE ONLY): 28 min   Charges:   PT Evaluation $PT Eval Moderate Complexity: 1 Mod PT Treatments $Gait Training: 8-22 mins        [place] [facility] 06/04/2022, 1:18 PM

## 2022-06-04 NOTE — Consult Note (Signed)
Palliative Care Consult Note                                  Date: 06/04/2022   Patient Name: Rick [name]  DOB: 1929-01-01  MRN: [id number]  Age / Sex: 88 y.o., male  PCP: Rick Ghent, MD Referring Physician: Hosie Poisson, MD  Reason for Consultation: Establishing goals of care  HPI/Patient Profile: 87 y.o. male  with past medical history of CKD stage IV secondary to polycystic kidney disease, HTN, HLD, hypothyroidism and prostate cancer s/p prostatectomy admitted on 05/31/2022 with worsening kidney function and hyponatremia. He was advised to go to the ED from nephrology office due to abnormal laboratory values. From chart review, kidney function and sodium levels have been abnormal for several weeks and not responding to multiple interventions. Associated symptoms include generalized weakness and episodes of diarrhea over the last couple of weeks.  Echocardiogram from 06/01/2022 revealed LVEF 30-35% down from 60-65% from last echo performed 11/2020. Also noted moderate to severe mitral regurgitation.  Palliative Medicine was consulted for goals of care.  Past Medical History:  Diagnosis Date   Arthritis    GERD (gastroesophageal reflux disease)    History of colon polyps    History of prostate cancer    Hyperlipidemia    Hypertension    Kidney cysts    bilateral.  Renal US   Mitral regurgitation 12/02/2020    Subjective:   I have reviewed medical records including progress notes, labs and imaging. Labs from this morning reveal persistent hyponatremia (Na-128) and worsening renal function (creatinine 3.49).   I met with patient and [name] at bedside to discuss diagnosis, prognosis, GOC, EOL wishes, disposition, and options.  I introduced Palliative Medicine as specialized medical care for people living with serious illness. It focuses on providing relief from the symptoms and stress of a serious illness.   A  brief life review was discussed. Rick [name] is retired from [employer]. 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 He has been married to his [name] for over 63 years. Together they have 3 children, 2 sons and 1 daughter. 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  Prior to hospitalization, Rick [name] was living at home with his wife and 2 sons. He was able to ambulate with a walker and was fairly independent with daily living, requires assistance from his wife with bathing/hygiene. In May 2023, he suffered a tractor accident which seems to have contributed to his overall functional decline over the last few months.  We discussed his current illness and what it means in the larger context of his ongoing co-morbidities. Discussion was had regarding his medical problems including acute on chronic kidney disease and new onset of heart failure with reduced ejection fraction.  Provided education on the natural trajectory of chronic illness, emphasizing that it is non-curable and progressive.    Provided education that the management of concurrent renal and cardiac disease can be challenging, as the medication used to manage heart failure symptoms (lasix) can cause kidney function to worsen. Discussed that he is not a candidate for advanced interventions such as cardiac cath or hemodialysis.   Rick [name] and [name] express understanding of his multiple comorbid conditions and the risk of further complications stemming from these disease processes.  A discussion was had today regarding  advanced directives. Concepts specific to code status, artifical feeding and hydration, continued IV antibiotics and rehospitalization was had.  The MOST form was introduced and discussed.  Rick [name] explains that in the event he is unable to make his own decisions regarding his medical care he wishes to have his [name] become  medical decision maker. He believes he has a copy of some form of advanced directive documents at home. [name] to reach out to wife to have documents brought in to be scanned into medical record.  We did discuss code status. Encouraged patient to consider DNR/DNI status understanding prognosis would be poor in the event of cardiac arrest. Explained the difference between Full Code/DNR and expected trajectory of each pathway. Explained that DNR/DNI is a protective measure to keep [place] from harming the patient in their last moments of life.   Rick [name] emphasized that he wishes to return home and "live out the time he has left at home." He also expressed his wishes to be able to "pass on when it is his time."   [name] not present during this initial visit, plans have been set to met again 12/28 AM with [name] and [name] at bedside for further discussions.    Review of Systems  Constitutional:  Positive for activity change.       Reports generalized weakness.  Respiratory:  Positive for cough. Negative for shortness of breath.   Neurological:  Positive for weakness.     Objective:   Primary Diagnoses: Present on Admission:  Hypokalemia  Hyponatremia  AKI (acute kidney injury) ([name]) superimposed on CKD stage IV  Essential hypertension  GERD without esophagitis  Anemia, iron deficiency  Stage 4 chronic kidney disease (HCC)  Severe mitral regurgitation   Physical Exam Vitals reviewed.  Constitutional:      General: He is not in acute distress.    Comments: Frail and chronically ill-appearing  Cardiovascular:     Rate and Rhythm: Normal rate.  Pulmonary:     Effort: No respiratory distress.  Musculoskeletal:     Right lower leg: No edema.     Left lower leg: No edema.  Neurological:     Mental Status: He is alert and oriented to person, place, and time.     Vital Signs:  BP 119/67 (BP Location: Left Arm)   Pulse 67   Temp 98.9 F (37.2 C) (Oral)    Resp 16   Ht _0  (1.702 m)   Wt 66 kg   SpO2 96%   BMI 22.79 kg/m   Palliative Assessment/Data: 60%     Assessment & Plan:   SUMMARY OF RECOMMENDATIONS   Meeting tomorrow at 9 am with wife and daughter at bedside Full Code for now, patient expresses desires for DNR, will confirm tomorrow Collect any previous Advanced Directives from wife if available Address further goals of care tomorrow  Primary Decision Maker: PATIENT  Code Status/Advance Care Planning: Full code  Symptom Management:  Pain management, Delirium management and Bowel Regimen as appropriate based on specific needs  Prognosis:  Unable to determine  Discharge Planning:  To Be Determined   Discussed with: Primary medical team    Thank you for allowing [place] to participate in the care of Rick [name]   Time Total: 75  Greater than 50%  of this time was spent counseling and coordinating care related to the above assessment and plan.  Signed by: Elie Confer, NP  [place] discussion and note partially completed by: Theodoro Grist,  NP Palliative Medicine Team  Team Phone # [phone]  For individual providers, please see AMION

## 2022-06-05 DIAGNOSIS — E871 Hypo-osmolality and hyponatremia: Secondary | ICD-10-CM | POA: Diagnosis not present

## 2022-06-05 DIAGNOSIS — N179 Acute kidney failure, unspecified: Secondary | ICD-10-CM | POA: Diagnosis not present

## 2022-06-05 DIAGNOSIS — N184 Chronic kidney disease, stage 4 (severe): Secondary | ICD-10-CM | POA: Diagnosis not present

## 2022-06-05 DIAGNOSIS — Z7189 Other specified counseling: Secondary | ICD-10-CM

## 2022-06-05 DIAGNOSIS — E876 Hypokalemia: Secondary | ICD-10-CM | POA: Diagnosis not present

## 2022-06-05 LAB — CBC
HCT: 24.1 % — ABNORMAL LOW (ref 39.0–52.0)
Hemoglobin: 8 g/dL — ABNORMAL LOW (ref 13.0–17.0)
MCH: 28.4 pg (ref 26.0–34.0)
MCHC: 33.2 g/dL (ref 30.0–36.0)
MCV: 85.5 fL (ref 80.0–100.0)
Platelets: 267 10*3/uL (ref 150–400)
RBC: 2.82 MIL/uL — ABNORMAL LOW (ref 4.22–5.81)
RDW: 16.5 % — ABNORMAL HIGH (ref 11.5–15.5)
WBC: 9.2 10*3/uL (ref 4.0–10.5)
nRBC: 0 % (ref 0.0–0.2)

## 2022-06-05 LAB — BASIC METABOLIC PANEL
Anion gap: 9 (ref 5–15)
BUN: 22 mg/dL (ref 8–23)
CO2: 29 mmol/L (ref 22–32)
Calcium: 8.1 mg/dL — ABNORMAL LOW (ref 8.9–10.3)
Chloride: 91 mmol/L — ABNORMAL LOW (ref 98–111)
Creatinine, Ser: 3.73 mg/dL — ABNORMAL HIGH (ref 0.61–1.24)
GFR, Estimated: 15 mL/min — ABNORMAL LOW (ref >60)
Glucose, Bld: 95 mg/dL (ref 70–99)
Potassium: 3.5 mmol/L (ref 3.5–5.1)
Sodium: 129 mmol/L — ABNORMAL LOW (ref 135–145)

## 2022-06-05 MED ORDER — PANTOPRAZOLE SODIUM 40 MG PO TBEC
40.0000 mg | DELAYED_RELEASE_TABLET | Freq: Every day | ORAL | Status: DC
  Administered 2022-06-05 – 2022-06-09 (×5): 40 mg via ORAL
  Filled 2022-06-05 (×5): qty 1

## 2022-06-05 MED ORDER — DARBEPOETIN ALFA 60 MCG/0.3ML IJ SOSY
60.0000 ug | PREFILLED_SYRINGE | INTRAMUSCULAR | Status: DC
  Administered 2022-06-05: 60 ug via SUBCUTANEOUS
  Filled 2022-06-05: qty 0.3

## 2022-06-05 NOTE — Progress Notes (Signed)
[facility] Progress Note   Subjective:   seen at bedside - no family present.  Worked with PT again and did great.  Says feels back to baseline.  UOP recorded as 174m yesterday.   Objective Vitals:   06/05/22 0300 06/05/22 0400 06/05/22 0500 06/05/22 0814  BP:    (!) 147/74  Pulse: 74 68 73 77  Resp: (!) 23 14 (!) 24   Temp:      TempSrc:      SpO2: 94% 94% 92%   Weight:   66.4 kg   Height:       Physical Exam General: elderly man sleeping flat in bed - awakens easily Heart: RRR, no rub Lungs: clear Abdomen: thin Extremities: no edema GU purewick  Additional Objective Labs: Basic Metabolic Panel: Recent Labs  Lab 06/01/22 0514 06/01/22 1831 06/03/22 1058 06/04/22 0115 06/05/22 0335  NA 127*   < > 127* 128* 129*  K 4.2   < > 3.5 3.5 3.5  CL 90*   < > 88* 88* 91*  CO2 26   < > '31 27 29  '$ GLUCOSE 117*   < > 96 92 95  BUN 16   < > '19 21 22  '$ CREATININE 3.21*   < > 3.30* 3.49* 3.73*  CALCIUM 8.1*   < > 7.9* 8.0* 8.1*  PHOS 2.4*  --   --   --   --    < > = values in this interval not displayed.    Liver Function Tests: Recent Labs  Lab 05/31/22 1146 06/01/22 0514 06/02/22 0223  AST '19 17 25  '$ ALT '8 9 10  '$ ALKPHOS 73 62 92  BILITOT 1.0 0.9 0.7  PROT 7.9 7.0 7.5  ALBUMIN 2.4* 2.0* 2.5*    No results for input(s): "LIPASE", "AMYLASE" in the last 168 hours. CBC: Recent Labs  Lab 05/31/22 1146 06/01/22 0514 06/02/22 0223 06/03/22 0659 06/04/22 0115 06/05/22 0335  WBC 9.4 8.8 11.5* 8.2 9.0 9.2  NEUTROABS 8.2*  --   --   --   --   --   HGB 9.6* 9.2* 9.9* 8.4* 8.0* 8.0*  HCT 29.9* 28.5* 30.0* 24.9* 24.9* 24.1*  MCV 87.7 87.7 85.7 85.6 86.5 85.5  PLT 357 323 308 275 257 267    Blood Culture    Component Value Date/Time   SDES URINE, CLEAN CATCH 12/28/2021 0250   SPECREQUEST  12/28/2021 0250    NONE Performed at [facility] [address], [place] [id number]   CULT (A) 12/28/2021 0250    >=100,000 COLONIES/mL  MULTIPLE SPECIES PRESENT, SUGGEST RECOLLECTION   REPTSTATUS 12/29/2021 FINAL 12/28/2021 0250    Cardiac Enzymes: No results for input(s): "CKTOTAL", "CKMB", "CKMBINDEX", "TROPONINI" in the last 168 hours. CBG: No results for input(s): "GLUCAP" in the last 168 hours. Iron Studies: No results for input(s): "IRON", "TIBC", "TRANSFERRIN", "FERRITIN" in the last 72 hours. '@lablastinr3'$ @ Studies/Results: No results found. Medications:    [START ON 06/09/2022] darbepoetin (ARANESP) injection - NON-DIALYSIS  60 mcg Subcutaneous Q Mon-1800   feeding supplement (GLUCERNA SHAKE)  237 mL Oral TID BM   ferrous sulfate  325 mg Oral Q breakfast   heparin  5,000 Units Subcutaneous Q8H   isosorbide-hydrALAZINE  1 tablet Oral BID   latanoprost  1 drop Both Eyes QHS   levothyroxine  50 mcg Oral Q0600   metoprolol succinate  12.5 mg Oral Daily   sodium bicarbonate  1,300 mg Oral BID  sodium chloride  1 g Oral TID WC   tamsulosin  0.4 mg Oral Daily   timolol  1 drop Both Eyes Daily    Assessment/Plan  AKI on CKD: likely in the setting of volume overload; baseline Cr ~2.5, up to 3.7 in setting of diuresis - has diuresed about 2.5L and appears euvolemic now -was on lasix 40 BID prior to admission -with Cr up further to 3.7 today would hold PM diuretic today and resume 40 BID tomorrow - renal US to make sure no obstruction  - busy [place] with lots of cysts and staghorn calculi but read as stable c/w 12/2021 - no indication for RRT at present   2.  Hyponatremia:             - failed OP diuretics             - IV diuresed             - improved to 129 despite clinical improvement in volume status  - appears baseline serum sodium is in high 120s low 130s   3.  Hypokalemia:             - repleted  4.  Anemia:  Hb 8. 12/18 iron sat 31%; ESA being arranged - will dose here.    4.  Dispo: admitted  - PT said looks good.  Pall has been consulted - wishes to continue full scope of care at this time.   Hopefully he can go home soon.  Should f/u with [facility] Dr. Candiss Norse in 1-2 weeks after d/c - will notify clinic to schedule.  Jannifer Hick MD 06/05/2022, 11:54 AM  [facility] Pager: [phone]

## 2022-06-05 NOTE — Progress Notes (Addendum)
Rounding Note    Patient Name: Rick [name] Date of Encounter: 06/05/2022  [place] Cardiologist: Dorris Carnes, MD   Subjective   Patient resting comfortably in bed this morning. His spouse and daughter are at bedside. Denies acute shortness of breath, chest pain, palpitations. He says that overall he feels about the same today as yesterday, continues to be weak.  Inpatient Medications    Scheduled Meds:  [START ON 06/09/2022] darbepoetin (ARANESP) injection - NON-DIALYSIS  60 mcg Subcutaneous Q Mon-1800   feeding supplement (GLUCERNA SHAKE)  237 mL Oral TID BM   ferrous sulfate  325 mg Oral Q breakfast   furosemide  40 mg Oral BID   heparin  5,000 Units Subcutaneous Q8H   isosorbide-hydrALAZINE  1 tablet Oral BID   latanoprost  1 drop Both Eyes QHS   levothyroxine  50 mcg Oral Q0600   metoprolol succinate  12.5 mg Oral Daily   sodium bicarbonate  1,300 mg Oral BID   sodium chloride  1 g Oral TID WC   tamsulosin  0.4 mg Oral Daily   timolol  1 drop Both Eyes Daily   Continuous Infusions:  PRN Meds: acetaminophen **OR** acetaminophen, ondansetron **OR** ondansetron (ZOFRAN) IV   Vital Signs    Vitals:   06/05/22 0300 06/05/22 0400 06/05/22 0500 06/05/22 0814  BP:    (!) 147/74  Pulse: 74 68 73 77  Resp: (!) 23 14 (!) 24   Temp:      TempSrc:      SpO2: 94% 94% 92%   Weight:   66.4 kg   Height:        Intake/Output Summary (Last 24 hours) at 06/05/2022 0921 Last data filed at 06/05/2022 0539 Gross per 24 hour  Intake 420 ml  Output 1526 ml  Net -1106 ml      06/05/2022    5:00 AM 06/04/2022    5:00 AM 06/03/2022    5:00 AM  Last 3 Weights  Weight (lbs) 146 lb 6.2 oz 145 lb 8.1 oz 145 lb 4.5 oz  Weight (kg) 66.4 kg 66 kg 65.9 kg      Telemetry    Sinus rhythm with first degree AV block pattern. Intermittent PVCs - Personally Reviewed  ECG    No new tracing  Physical Exam   GEN: No acute distress.   Neck: No JVD Cardiac: RRR,  no rubs, or gallops. Quiet systolic murmur over apex. Respiratory: Clear to auscultation bilaterally. GI: Soft, nontender, non-distended  MS: No edema; No deformity. Neuro:  Nonfocal  Psych: Normal affect   Labs    High Sensitivity Troponin:  No results for input(s): "TROPONINIHS" in the last 720 hours.   Chemistry Recent Labs  Lab 05/31/22 1146 06/01/22 0514 06/01/22 1831 06/02/22 0223 06/02/22 1456 06/03/22 1058 06/04/22 0115 06/05/22 0335  NA 128* 127*   < > 126*   < > 127* 128* 129*  K 3.0* 4.2   < > 3.5   < > 3.5 3.5 3.5  CL 89* 90*   < > 88*   < > 88* 88* 91*  CO2 26 26   < > 26   < > '31 27 29  '$ GLUCOSE 91 117*   < > 113*   < > 96 92 95  BUN 16 16   < > 17   < > '19 21 22  '$ CREATININE 3.32* 3.21*   < > 3.17*   < > 3.30* 3.49* 3.73*  CALCIUM 8.1* 8.1*   < > 8.3*   < > 7.9* 8.0* 8.1*  MG  --  1.7  --   --   --   --   --   --   PROT 7.9 7.0  --  7.5  --   --   --   --   ALBUMIN 2.4* 2.0*  --  2.5*  --   --   --   --   AST 19 17  --  25  --   --   --   --   ALT 8 9  --  10  --   --   --   --   ALKPHOS 73 62  --  92  --   --   --   --   BILITOT 1.0 0.9  --  0.7  --   --   --   --   GFRNONAA 17* 18*   < > 18*   < > 17* 16* 15*  ANIONGAP 13 11   < > 12   < > '8 13 9   '$ < > = values in this interval not displayed.    Lipids  Recent Labs  Lab 06/02/22 0223  CHOL 91  TRIG 68  HDL 40*  LDLCALC 37  CHOLHDL 2.3    Hematology Recent Labs  Lab 06/03/22 0659 06/04/22 0115 06/05/22 0335  WBC 8.2 9.0 9.2  RBC 2.91* 2.88* 2.82*  HGB 8.4* 8.0* 8.0*  HCT 24.9* 24.9* 24.1*  MCV 85.6 86.5 85.5  MCH 28.9 27.8 28.4  MCHC 33.7 32.1 33.2  RDW 16.1* 16.2* 16.5*  PLT 275 257 267   Thyroid No results for input(s): "TSH", "FREET4" in the last 168 hours.  BNP Recent Labs  Lab 05/31/22 2211  BNP 1,180.5*    DDimer No results for input(s): "DDIMER" in the last 168 hours.   Radiology    No results found.  Cardiac Studies   Echo 06/01/22:    1. LVEF 30-35% with global  hypokinesis. Significant LV dyssynchrony  present. Left ventricular ejection fraction, by estimation, is 30 to 35%.  The left ventricle has moderately decreased function. The left ventricle  demonstrates global hypokinesis. There   is mild concentric left ventricular hypertrophy. Indeterminate diastolic  filling due to E-A fusion.   2. Right ventricular systolic function is normal. The right ventricular  size is normal.   3. Left atrial size was moderately dilated.   4. Right atrial size was mildly dilated.   5. A small pericardial effusion is present. The pericardial effusion is  anterior to the right ventricle.   6. Moderate to severe MR. Eccentric jet. The mitral valve is grossly  normal. Moderate to severe mitral valve regurgitation. No evidence of  mitral stenosis.   7. The aortic valve is tricuspid. There is moderate calcification of the  aortic valve. There is moderate thickening of the aortic valve. Aortic  valve regurgitation is moderate. Mild aortic valve stenosis.   Comparison(s): Changes from prior study are noted. LVEF now 30-35%.  Moderate to severe MR remains present. AI is moderate. Significant LV  dyssynchrony present consistent with LBBB.      Echo from 12/01/20:    1. Moderate to severe mitral regurgitation is present, likely severe. The  jet is eccentric and anteriorly directed. There is likely pulmonary vein  flow reversal in systole in the right inferior pulmonary vein. There is  splay artifact present, indicating   at  least moderate severity. The jet appears to wrap around the LA,  indicative of severe MR. RVSP elevated. There is no obvious flail segment,  but suspect this is not well seen. Would recommend a TEE for  characterization of the MR. The mitral valve is  grossly normal. Moderate to severe mitral valve regurgitation. No evidence  of mitral stenosis.   2. Left ventricular ejection fraction, by estimation, is 60 to 65%. The  left ventricle has normal  function. The left ventricle has no regional  wall motion abnormalities. There is severe asymmetric left ventricular  hypertrophy of the basal-septal  segment. Indeterminate diastolic filling due to E-A fusion.   3. Right ventricular systolic function is normal. The right ventricular  size is normal. There is mildly elevated pulmonary artery systolic  pressure. The estimated right ventricular systolic pressure is 00.8 mmHg.   4. The aortic valve is tricuspid. Aortic valve regurgitation is mild to  moderate. Mild aortic valve sclerosis is present, with no evidence of  aortic valve stenosis.   5. The inferior vena cava is normal in size with greater than 50%  respiratory variability, suggesting right atrial pressure of 3 mmHg.   6. There is a large cystic structure located inferior to the liver, which  could represent a cyst. Would recommend dedicated liver imaging.   Patient Profile     Rick [name] is a 88 y.o. 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   Assessment & Plan    Acute combined systolic and diastolic heart failure   Patient admitted in the setting of volume overload, chronic hyponatremia and AKI on CKD IV. BNP 1180. 06/01/22 TTE with LVEF 30-35%, global hypokinesis, normal RV, mod LAE, mild RAE, small pericardial effusion, and moderate to severe MR, moderate AO, mild AS. LVEF down from normal on 12/01/20 TTE. No historical ischemic evaluation with renal failure.    Patient continues diuresis per nephrology, now transitioned to oral Lasix with increasing creatinine. Net negative -2.2L.  Now off supplemental oxygen and breathing appears improved. Initiated on Metoprolol  Succinate '25mg'$  (now decreased to 12.'5mg'$ ), Bidil 20/37.'5mg'$  BID, tolerating well. No ARNI/MRA/SGLT2i with advanced CKD.  Lipid panel without elevation of LDL or triglycerides. Given that patient would not be a good candidate for intervention, would not recommend ischemic workup with stress testing at this time. Recommend medical management at this time.  Patient has outpatient follow up with Dr. Harrington Challenger on 1/17.      Moderate to Severe MR   TTE this admission with stable moderate to severe MR, eccentric jet.    Medical management at this time with close outpatient follow up.      Hypertension   BP improved with initiation of Metoprolol Succinate and Bidil. Continue at discharge.     AKI on CKD IV  Patient being followed by nephrology this admission. Creatinine increased to 3.73 from 3.49, patient now transitioned to PO lasix.   Hypokalemia  K this morning at 3.5, recommend maintaining >4.0.      For questions or updates, please contact [place] Please consult [email or web address] for contact info under        Signed, Lily Kocher, PA-C  06/05/2022, 9:21 AM  Patient seen and examined with EW PA-C.  Agree as above, with the following exceptions and changes as noted below. No CP, no SOB. No family at bedside. Comfortable no distress, resting. Gen: NAD, CV: RRR, Lungs: no increased work of breathing, Abd: soft, Extrem: no edema. All available labs, radiology testing, previous records reviewed. Medication management as noted above. Tolerating medical therapy, continue at current doses. No new cardiovascular recommendations. He has close outpatient follow up. Cardiology will sign off.  Elouise Munroe, MD 06/05/22 11:20 AM

## 2022-06-05 NOTE — Progress Notes (Signed)
Daily Progress Note   Patient Name: Rick [name]      Date: 06/05/2022 DOB: 1929-05-11  Age: 88 y.o. MRN#: [id number] Attending Physician: Rick Aloe, MD Primary Care Physician: Rick Ghent, MD Admit Date: 05/31/2022  Reason for Consultation/Follow-up: Establishing goals of care  HPI/Brief Hospital Review: 88 y.o. male  with past medical history of CKD stage IV secondary to polycystic kidney disease, HTN, HLD, hypothyroidism and prostate cancer s/p prostatectomy admitted on 05/31/2022 with worsening kidney function and hyponatremia. He was advised to go to the ED from nephrology office due to abnormal laboratory values. From chart review, kidney function and sodium levels have been abnormal for several weeks and not responding to multiple interventions. Associated symptoms include generalized weakness and episodes of diarrhea over the last couple of weeks.   Echocardiogram from 06/01/2022 revealed LVEF 30-35% down from 60-65% from last echo performed 11/2020. Also noted moderate to severe mitral regurgitation.  He was able to work with PT yesterday (12/27) with good progress, able to walk short distance with walker, generalized weakness noted.  12/28: Sodium levels at 129 slight improvement, creatinine remains elevated from baseline at 3.73 He has been weaned off of supplemental oxygen at this time and has been able to maintain adequate saturations.  Palliative Medicine consulted for Goals of Care.  Subjective: Extensive chart review has been completed prior to meeting patient including labs, vital signs, imaging, progress notes, orders, and available advanced directive documents from current and previous encounters.    Visited with patient is his room. [name] and  [name] present at bedside. Reintroduced myself as being part of the palliative medicine team.  Rick [name] reports feeling good today and back to his baseline and is looking forward to returning home.  Revisited conversations surrounding goals of care and code status that were introduced during our initial visit, 12/27. Rick [name] explains that at this time he would want resuscitation measures to be performed and desires hospitalization if his condition warrants. Provided reeducation on the likely poor prognosis in the event of a cardiac arrest and the disease trajectory of his chronic conditions including chronic kidney disease and heart failure. Introduced the topic of bounce back between home and hospital due to multiple comorbid conditions and encouraged realistic goals to be set from this perspective. Explained options such as  home hospice services that are available to allow Rick [name] to live out the rest of his life at home with a goal of comfort.   Rick [name] expresses understanding but wishes to pursuit full scope of treatment at this time. He wishes to be given a chance at resuscitation.  Encouraged Rick [name] and his family to continue ongoing conversations regarding his wishes and his desires at end of life. MOST form and Hard Choices booklet provided to help guide these conversations.  Review of Systems  Respiratory:  Negative for shortness of breath.   Cardiovascular:  Negative for leg swelling.  Neurological:  Positive for weakness.   Objective:  Physical Exam Constitutional:      General: He is not in acute distress.    Comments: Alert and oriented. Conversing appropriately.  Cardiovascular:     Rate and Rhythm: Normal rate.  Pulmonary:     Effort: No respiratory distress.  Musculoskeletal:     Right lower leg: No edema.     Left lower leg: No edema.  Neurological:     Mental Status: He is alert and oriented to person, place, and time.             Vital  Signs: BP (!) 147/74   Pulse 77   Temp 98.4 F (36.9 C) (Oral)   Resp (!) 24   Ht '5\' 7"'$  (1.702 m)   Wt 66.4 kg   SpO2 92%   BMI 22.93 kg/m  SpO2: SpO2: 92 % O2 Device: O2 Device: Room Air O2 Flow Rate: O2 Flow Rate (L/min): 2 L/min   Palliative Care Assessment & Plan   Assessment/Recommendation/Plan  Desires to remain Full Code and pursuit full scope of treatment Encouraged Rick [name] and family to continue ongoing conversations regarding [place] and Code Status Anticipate discharge home in the next 24-48 hours Please contact PMT phone for any ongoing needs  Thank you for allowing the Palliative Medicine Team to assist in the care of this patient.  Total time:  50 minutes  Greater than 50%  of this time was spent counseling and coordinating care related to the above assessment and plan.  Rick Grist, DNP, AGNP-C Palliative Medicine   Please contact Palliative Medicine Team phone at [phone] for questions and concerns.

## 2022-06-05 NOTE — Progress Notes (Addendum)
Physical Therapy Treatment Patient Details Name: Rick [name] MRN: [id number] DOB: 1928-07-03 Today's Date: 06/05/2022   History of Present Illness Pt is 88 year old presented to [facility]  05/31/22 with hypokalemia, hyponatremia, and AKI. Pt also found to have acute combined systolic and diastolic heart failure. PMH - CKD, moderate to severe MR, LVH, pulmonary hypertension, HTN, prostate CA, pelvic fx    PT Comments    Pt continues to make steady progress and recommend return home with family.    Recommendations for follow up therapy are one component of a multi-disciplinary discharge planning process, led by the attending physician.  Recommendations may be updated based on patient status, additional functional criteria and insurance authorization.  Follow Up Recommendations  No PT follow up     Assistance Recommended at Discharge Intermittent Supervision/Assistance  Patient can return home with the following A little help with walking and/or transfers;A little help with bathing/dressing/bathroom   Equipment Recommendations  None recommended by PT    Recommendations for Other Services       Precautions / Restrictions Precautions Precautions: Fall Restrictions Weight Bearing Restrictions: No     Mobility  Bed Mobility Overal bed mobility: Needs Assistance Bed Mobility: Supine to Sit, Sit to Supine     Supine to sit: Supervision, HOB elevated Sit to supine: Supervision   General bed mobility comments: Incr time and effort but no physical assist    Transfers Overall transfer level: Needs assistance Equipment used: Rollator (4 wheels) Transfers: Sit to/from Stand Sit to Stand: Min assist           General transfer comment: Assist to initially steady on standing    Ambulation/Gait Ambulation/Gait assistance: Supervision Gait Distance (Feet): 220 Feet Assistive device: Rollator (4 wheels) Gait Pattern/deviations: Step-through pattern, Decreased stride  length, Trunk flexed Gait velocity: decr Gait velocity interpretation: 1.31 - 2.62 ft/sec, indicative of limited community ambulator   General Gait Details: Assist for safety. Pt progressing from min guard to supervision   Stairs             Wheelchair Mobility    Modified Rankin (Stroke Patients Only)       Balance Overall balance assessment: Needs assistance Sitting-balance support: No upper extremity supported, Feet supported Sitting balance-Leahy Scale: Good     Standing balance support: Bilateral upper extremity supported, During functional activity Standing balance-Leahy Scale: Poor Standing balance comment: rollator and min guard for static standing                            Cognition Arousal/Alertness: Awake/alert Behavior During Therapy: WFL for tasks assessed/performed Overall Cognitive Status: Within Functional Limits for tasks assessed                                          Exercises      General Comments General comments (skin integrity, edema, etc.): VSS on RA      Pertinent Vitals/Pain      Home Living Family/patient expects to be discharged to:: Private residence Living Arrangements: Spouse/significant other Available Help at Discharge: Family;Available 24 hours/day Type of Home: House Home Access: Stairs to enter Entrance Stairs-Rails: Right Entrance Stairs-Number of Steps: 2   Home Layout: One level Home Equipment: Cane - single point;Rollator (4 wheels)      Prior Function  PT Goals (current goals can now be found in the care plan section) Acute Rehab PT Goals Patient Stated Goal: return home Progress towards PT goals: Progressing toward goals    Frequency    Min 2X/week      PT Plan Current plan remains appropriate    Co-evaluation              AM-PAC PT "6 Clicks" Mobility   Outcome Measure  Help needed turning from your back to your side while in a flat bed  without using bedrails?: None Help needed moving from lying on your back to sitting on the side of a flat bed without using bedrails?: None Help needed moving to and from a bed to a chair (including a wheelchair)?: A Little Help needed standing up from a chair using your arms (e.g., wheelchair or bedside chair)?: A Little Help needed to walk in hospital room?: A Little Help needed climbing 3-5 steps with a railing? : A Little 6 Click Score: 20    End of Session Equipment Utilized During Treatment: Gait belt Activity Tolerance: Patient tolerated treatment well Patient left: in bed;with call bell/phone within reach;with bed alarm set   PT Visit Diagnosis: Other abnormalities of gait and mobility (R26.89);Muscle weakness (generalized) (M62.81)     Time: 6168-3729 PT Time Calculation (min) (ACUTE ONLY): 12 min  Charges:  $Gait Training: 8-22 mins                     [place] [facility] [place] 06/05/2022, 2:24 PM

## 2022-06-05 NOTE — Progress Notes (Signed)
PROGRESS NOTE    Rick [name]  ZSW:[id number] DOB: 1929/01/04 DOA: 05/31/2022 PCP: Tonia Ghent, MD   Brief Narrative:  Rick [name] is a 88 y.o. male with a history of CKD stage IV secondary to polycystic kidney disease, hypertension, hyperlipidemia, hypothyroidism, GERD, prostate cancer s/p prostatectomy. Patient presented secondary to evidence of AKI on outpatient metabolic panel. Patient with evidence of fluid overload and newly diagnosed systolic heart failure. Patient diuresed successfully with worsening AKI. Nephrology on board.  Assessment and Plan:  AKI on CKD stage IV Baseline creatinine is about 2.5. Creatinine of 3.32 on admission. Secondary to volume overload from acute heart failure. Initial improvement with diuresis and now with worsening creatinine. Renal ultrasound significant for numerous cysts and large left staghorn calculus stable from 12/2021. Creatinine of 3.73 this morning on BMP. -Nephrology recommendations: hold diuretic therapy this evening and resume in AM -BMP in AM  Hyponatremia Mild and stable. Asymptomatic.  Hypokalemia Mild. Potassium down to 3.2. Likely secondary to diuresis. Resolved with repletion.  Acute systolic heart failure New diagnosis. LVEF of 30-35% with associated global hypokinesis. Cardiology consulted. Patient diuresed with IV Lasix and is now euvolemic. -Lasix 40 mg BID (held this afternoon secondary to worsening AKI)  Mitral regurgitation Noted on Transthoracic Echocardiogram.  Aortic regurgitation Noted on Transthoracic Echocardiogram.   DVT prophylaxis: Heparin subq Code Status:   Code Status: Full Code Family Communication: Wife and daughter at bedside Disposition Plan: Discharge home pending nephrology recommendations for discharge secondary to AKI.   Consultants:  Cardiology Nephrology Palliative care medicine  Procedures:  12/24: Transthoracic Echocardiogram   Antimicrobials: None     Subjective: Patient reports no issues this morning. Urinating well. Eager to discharge home.  Objective: BP (!) 147/74   Pulse 77   Temp 98.4 F (36.9 C) (Oral)   Resp (!) 24   Ht '5\' 7"'$  (1.702 m)   Wt 66.4 kg   SpO2 92%   BMI 22.93 kg/m   Examination:  General exam: Appears calm and comfortable Respiratory system: Clear to auscultation. Respiratory effort normal. Cardiovascular system: S1 & S2 heard, RRR. 2/6 systolic murmur Gastrointestinal system: Abdomen is nondistended, soft and nontender. No organomegaly or masses felt. Normal bowel sounds heard. Central nervous system: Alert and oriented. No focal neurological deficits. Musculoskeletal: No edema. No calf tenderness Skin: No cyanosis. No rashes Psychiatry: Judgement and insight appear normal. Mood & affect appropriate.    Data Reviewed: I have personally reviewed following labs and imaging studies  CBC Lab Results  Component Value Date   WBC 9.2 06/05/2022   RBC 2.82 (L) 06/05/2022   HGB 8.0 (L) 06/05/2022   HCT 24.1 (L) 06/05/2022   MCV 85.5 06/05/2022   MCH 28.4 06/05/2022   PLT 267 06/05/2022   MCHC 33.2 06/05/2022   RDW 16.5 (H) 06/05/2022   LYMPHSABS 0.6 (L) 05/31/2022   MONOABS 0.5 05/31/2022   EOSABS 0.2 05/31/2022   BASOSABS 0.0 32/20/2542     Last metabolic panel Lab Results  Component Value Date   NA 129 (L) 06/05/2022   K 3.5 06/05/2022   CL 91 (L) 06/05/2022   CO2 29 06/05/2022   BUN 22 06/05/2022   CREATININE 3.73 (H) 06/05/2022   GLUCOSE 95 06/05/2022   GFRNONAA 15 (L) 06/05/2022   GFRAA 49 (L) 01/11/2015   CALCIUM 8.1 (L) 06/05/2022   PHOS 2.4 (L) 06/01/2022   PROT 7.5 06/02/2022   ALBUMIN 2.5 (L) 06/02/2022   LABGLOB 5.2 (H) 08/19/2021  AGRATIO 0.6 (L) 08/19/2021   BILITOT 0.7 06/02/2022   ALKPHOS 92 06/02/2022   AST 25 06/02/2022   ALT 10 06/02/2022   ANIONGAP 9 06/05/2022    GFR: Estimated Creatinine Clearance: 12.1 mL/min (A) (by C-G formula based on SCr of 3.73  mg/dL (H)).  No results found for this or any previous visit (from the past 240 hour(s)).    Radiology Studies: No results found.    LOS: 5 days    Cordelia Poche, MD [facility] 06/05/2022, 9:25 AM   If 7PM-7AM, please contact night-coverage [email or web address]

## 2022-06-05 NOTE — Evaluation (Signed)
Occupational Therapy Evaluation Patient Details Name: Rick [name] MRN: [id number] DOB: 14-Feb-1929 Today's Date: 06/05/2022   History of Present Illness Pt is 88 year old presented to [facility] on  05/31/22 with hypokalemia, hyponatremia, and AKI. Pt also found to have acute combined systolic and diastolic heart failure. PMH - CKD, moderate to severe MR, LVH, pulmonary hypertension, HTN, prostate CA, pelvic fx   Clinical Impression   Patient admitted for the diagnosis above.  PTA he lives at home with his spouse, and has assist from family for bathing/dressing, medications, iADL, and community mobility.  PT and mobility team is seeing him for ambulation, and given the amount of assist provided for ADL and iADL, patient is very close to his baseline.  No skilled OT indicated in the acute setting, or post acute.        Recommendations for follow up therapy are one component of a multi-disciplinary discharge planning process, led by the attending physician.  Recommendations may be updated based on patient status, additional functional criteria and insurance authorization.   Follow Up Recommendations  No OT follow up     Assistance Recommended at Discharge Intermittent Supervision/Assistance  Patient can return home with the following Assist for transportation;Assistance with cooking/housework;A little help with walking and/or transfers;A lot of help with bathing/dressing/bathroom    Functional Status Assessment  Patient has not had a recent decline in their functional status  Equipment Recommendations  None recommended by OT    Recommendations for Other Services       Precautions / Restrictions Precautions Precautions: Fall Restrictions Weight Bearing Restrictions: No      Mobility Bed Mobility Overal bed mobility: Needs Assistance Bed Mobility: Supine to Sit     Supine to sit: Supervision          Transfers Overall transfer level: Needs assistance   Transfers: Sit  to/from Stand, Bed to chair/wheelchair/BSC Sit to Stand: Min guard, Min assist     Step pivot transfers: Min guard            Balance Overall balance assessment: Needs assistance Sitting-balance support: No upper extremity supported, Feet supported Sitting balance-Leahy Scale: Good     Standing balance support: Bilateral upper extremity supported, During functional activity Standing balance-Leahy Scale: Poor                             ADL either performed or assessed with clinical judgement   ADL Overall ADL's : At baseline                                             Vision Patient Visual Report: No change from baseline       Perception     Praxis      Pertinent Vitals/Pain Pain Assessment Pain Assessment: No/denies pain     Hand Dominance Right   Extremity/Trunk Assessment Upper Extremity Assessment Upper Extremity Assessment: Generalized weakness   Lower Extremity Assessment Lower Extremity Assessment: Defer to PT evaluation   Cervical / Trunk Assessment Cervical / Trunk Assessment: Kyphotic   Communication Communication Communication: HOH   Cognition Arousal/Alertness: Awake/alert Behavior During Therapy: WFL for tasks assessed/performed Overall Cognitive Status: Within Functional Limits for tasks assessed Area of Impairment: Memory  Memory: Decreased short-term memory               General Comments   VSS on RA    Exercises     Shoulder Instructions      Home Living Family/patient expects to be discharged to:: Private residence Living Arrangements: Spouse/significant other Available Help at Discharge: Family;Available 24 hours/day Type of Home: House Home Access: Stairs to enter [employer] of Steps: 2 Entrance Stairs-Rails: Right Home Layout: One level     Bathroom Shower/Tub: Teacher, early years/pre: Handicapped height     Home Equipment: Cane  - single point;Rollator (4 wheels)          Prior Functioning/Environment Prior Level of Function : Independent/Modified Independent             Mobility Comments: amb with rollator ADLs Comments: wife assists with bathing and dressing.  Family assists with medications, community mobility and iADL        OT Problem List: Decreased activity tolerance      OT Treatment/Interventions:      OT Goals(Current goals can be found in the care plan section) Acute Rehab OT Goals Patient Stated Goal: Hoping to return home OT Goal Formulation: With patient Time For Goal Achievement: 06/12/22 Potential to Achieve Goals: Good  OT Frequency:      Co-evaluation              AM-PAC OT "6 Clicks" Daily Activity     Outcome Measure Help from another person eating meals?: None Help from another person taking care of personal grooming?: None Help from another person toileting, which includes using toliet, bedpan, or urinal?: A Little Help from another person bathing (including washing, rinsing, drying)?: A Little Help from another person to put on and taking off regular upper body clothing?: None Help from another person to put on and taking off regular lower body clothing?: A Little 6 Click Score: 21   End of Session Equipment Utilized During Treatment: Rolling walker (2 wheels) Nurse Communication: Mobility status  Activity Tolerance: Patient tolerated treatment well Patient left: in chair;with call bell/phone within reach;with family/visitor present  OT Visit Diagnosis: Unsteadiness on feet (R26.81);Muscle weakness (generalized) (M62.81)                Time: 3532-9924 OT Time Calculation (min): 19 min Charges:  OT General Charges $OT Visit: 1 Visit OT Evaluation $OT Eval Moderate Complexity: 1 Mod  06/05/2022  RP, OTR/L  Acute Rehabilitation Services  Office:  [phone]   Metta Clines 06/05/2022, 12:18 PM

## 2022-06-05 NOTE — Plan of Care (Signed)

## 2022-06-06 DIAGNOSIS — N184 Chronic kidney disease, stage 4 (severe): Secondary | ICD-10-CM | POA: Diagnosis not present

## 2022-06-06 DIAGNOSIS — E876 Hypokalemia: Secondary | ICD-10-CM | POA: Diagnosis not present

## 2022-06-06 DIAGNOSIS — E871 Hypo-osmolality and hyponatremia: Secondary | ICD-10-CM | POA: Diagnosis not present

## 2022-06-06 DIAGNOSIS — N179 Acute kidney failure, unspecified: Secondary | ICD-10-CM | POA: Diagnosis not present

## 2022-06-06 LAB — CBC
HCT: 24.1 % — ABNORMAL LOW (ref 39.0–52.0)
Hemoglobin: 8 g/dL — ABNORMAL LOW (ref 13.0–17.0)
MCH: 28.5 pg (ref 26.0–34.0)
MCHC: 33.2 g/dL (ref 30.0–36.0)
MCV: 85.8 fL (ref 80.0–100.0)
Platelets: 257 10*3/uL (ref 150–400)
RBC: 2.81 MIL/uL — ABNORMAL LOW (ref 4.22–5.81)
RDW: 16.6 % — ABNORMAL HIGH (ref 11.5–15.5)
WBC: 9.4 10*3/uL (ref 4.0–10.5)
nRBC: 0 % (ref 0.0–0.2)

## 2022-06-06 LAB — BASIC METABOLIC PANEL
Anion gap: 12 (ref 5–15)
BUN: 25 mg/dL — ABNORMAL HIGH (ref 8–23)
CO2: 25 mmol/L (ref 22–32)
Calcium: 8.1 mg/dL — ABNORMAL LOW (ref 8.9–10.3)
Chloride: 90 mmol/L — ABNORMAL LOW (ref 98–111)
Creatinine, Ser: 3.63 mg/dL — ABNORMAL HIGH (ref 0.61–1.24)
GFR, Estimated: 15 mL/min — ABNORMAL LOW (ref >60)
Glucose, Bld: 112 mg/dL — ABNORMAL HIGH (ref 70–99)
Potassium: 3.4 mmol/L — ABNORMAL LOW (ref 3.5–5.1)
Sodium: 127 mmol/L — ABNORMAL LOW (ref 135–145)

## 2022-06-06 LAB — OSMOLALITY: Osmolality: 273 mOsm/kg — ABNORMAL LOW (ref 275–295)

## 2022-06-06 LAB — SODIUM: Sodium: 126 mmol/L — ABNORMAL LOW (ref 135–145)

## 2022-06-06 LAB — SODIUM, URINE, RANDOM: Sodium, Ur: 109 mmol/L

## 2022-06-06 MED ORDER — FUROSEMIDE 40 MG PO TABS
40.0000 mg | ORAL_TABLET | Freq: Every day | ORAL | Status: DC
  Administered 2022-06-06 – 2022-06-09 (×4): 40 mg via ORAL
  Filled 2022-06-06 (×4): qty 1

## 2022-06-06 MED ORDER — POTASSIUM CHLORIDE 20 MEQ PO PACK
20.0000 meq | PACK | Freq: Once | ORAL | Status: AC
  Administered 2022-06-06: 20 meq via ORAL
  Filled 2022-06-06: qty 1

## 2022-06-06 NOTE — Plan of Care (Signed)

## 2022-06-06 NOTE — Progress Notes (Signed)
PROGRESS NOTE    Rick [name]  FAO:[id number] DOB: [birth year] DOA: 05/31/2022 PCP: Tonia Ghent, MD   Brief Narrative:  Rick [name] is a 88 y.o. male with a history of CKD stage IV secondary to polycystic kidney disease, hypertension, hyperlipidemia, hypothyroidism, GERD, prostate cancer s/p prostatectomy. Patient presented secondary to evidence of AKI on outpatient metabolic panel. Patient with evidence of fluid overload and newly diagnosed systolic heart failure. Patient diuresed successfully with worsening AKI. Nephrology on board.  Assessment and Plan:  AKI on CKD stage IV Baseline creatinine is about 2.5. Creatinine of 3.32 on admission. Secondary to volume overload from acute heart failure. Initial improvement with diuresis and now with worsening creatinine. Renal ultrasound significant for numerous cysts and large left staghorn calculus stable from 12/2021. Creatinine of 3.63 this morning on BMP. -Nephrology recommendations: Resume diuretics -BMP in AM  Hyponatremia Trending down. Nephrology resuming diuretics, fluid restricting and recommending to continue with salt tablets.  Hypokalemia Mild. Potassium down to 3.2. Likely secondary to diuresis. Resolved with repletion. -Treat with supplementation as needed  Acute systolic heart failure New diagnosis. LVEF of 30-35% with associated global hypokinesis. Cardiology consulted. Patient diuresed with IV Lasix and is now euvolemic. Lasix dosing as mentioned above.  Mitral regurgitation Noted on Transthoracic Echocardiogram.  Aortic regurgitation Noted on Transthoracic Echocardiogram.   DVT prophylaxis: Heparin subq Code Status:   Code Status: Full Code Family Communication: Wife and daughter at bedside Disposition Plan: Discharge home pending nephrology recommendations for discharge secondary to AKI.   Consultants:  Cardiology Nephrology Palliative care medicine  Procedures:  12/24: Transthoracic  Echocardiogram   Antimicrobials: None    Subjective: No issues this morning. Continues to be eager for discharge.  Objective: BP 124/76 (BP Location: Left Arm)   Pulse 70   Temp 97.6 F (36.4 C) (Oral)   Resp 16   Ht '5\' 7"'$  (1.702 m)   Wt 66.5 kg   SpO2 96%   BMI 22.96 kg/m   Examination:  General exam: Appears calm and comfortable Respiratory system: Clear to auscultation. Respiratory effort normal. Cardiovascular system: S1 & S2 heard, RRR. Gastrointestinal system: Abdomen is nondistended, soft and nontender. Normal bowel sounds heard. Central nervous system: Alert and oriented. Psychiatry: Judgement and insight appear normal. Mood & affect appropriate.    Data Reviewed: I have personally reviewed following labs and imaging studies  CBC Lab Results  Component Value Date   WBC 9.4 06/06/2022   RBC 2.81 (L) 06/06/2022   HGB 8.0 (L) 06/06/2022   HCT 24.1 (L) 06/06/2022   MCV 85.8 06/06/2022   MCH 28.5 06/06/2022   PLT 257 06/06/2022   MCHC 33.2 06/06/2022   RDW 16.6 (H) 06/06/2022   LYMPHSABS 0.6 (L) 05/31/2022   MONOABS 0.5 05/31/2022   EOSABS 0.2 05/31/2022   BASOSABS 0.0 69/62/9528     Last metabolic panel Lab Results  Component Value Date   NA 126 (L) 06/06/2022   K 3.4 (L) 06/06/2022   CL 90 (L) 06/06/2022   CO2 25 06/06/2022   BUN 25 (H) 06/06/2022   CREATININE 3.63 (H) 06/06/2022   GLUCOSE 112 (H) 06/06/2022   GFRNONAA 15 (L) 06/06/2022   GFRAA 49 (L) 01/11/2015   CALCIUM 8.1 (L) 06/06/2022   PHOS 2.4 (L) 06/01/2022   PROT 7.5 06/02/2022   ALBUMIN 2.5 (L) 06/02/2022   LABGLOB 5.2 (H) 08/19/2021   AGRATIO 0.6 (L) 08/19/2021   BILITOT 0.7 06/02/2022   ALKPHOS 92 06/02/2022   AST  25 06/02/2022   ALT 10 06/02/2022   ANIONGAP 12 06/06/2022    GFR: Estimated Creatinine Clearance: 12.4 mL/min (A) (by C-G formula based on SCr of 3.63 mg/dL (H)).  No results found for this or any previous visit (from the past 240 hour(s)).    Radiology  Studies: No results found.    LOS: 6 days    Cordelia Poche, MD [facility] 06/06/2022, 5:26 PM   If 7PM-7AM, please contact night-coverage [email or web address]

## 2022-06-06 NOTE — Progress Notes (Signed)
[facility] Progress Note   Subjective:   patient seen and examined bedside. Wife and daughter at bedside. No acute events. No complaints, no swelling. Eager to go home.  Objective Vitals:   06/06/22 0300 06/06/22 0500 06/06/22 0750 06/06/22 1139  BP: 102/61  133/75 (!) 95/59  Pulse: 71  73 71  Resp: '15  20 18  '$ Temp: 98.4 F (36.9 C)  98 F (36.7 C) 98 F (36.7 C)  TempSrc: Oral  Oral Oral  SpO2: 91%  93% 94%  Weight:  66.5 kg    Height:       Physical Exam General: elderly man, laying flat in bed Heart: RRR, no rub Lungs: cta b/l Abdomen: soft, nt, slightly distended Extremities: no edema Neuro: awake, alert GU purewick  Additional Objective Labs: Basic Metabolic Panel: Recent Labs  Lab 06/01/22 0514 06/01/22 1831 06/04/22 0115 06/05/22 0335 06/06/22 0034  NA 127*   < > 128* 129* 127*  K 4.2   < > 3.5 3.5 3.4*  CL 90*   < > 88* 91* 90*  CO2 26   < > '27 29 25  '$ GLUCOSE 117*   < > 92 95 112*  BUN 16   < > 21 22 25*  CREATININE 3.21*   < > 3.49* 3.73* 3.63*  CALCIUM 8.1*   < > 8.0* 8.1* 8.1*  PHOS 2.4*  --   --   --   --    < > = values in this interval not displayed.   Liver Function Tests: Recent Labs  Lab 05/31/22 1146 06/01/22 0514 06/02/22 0223  AST '19 17 25  '$ ALT '8 9 10  '$ ALKPHOS 73 62 92  BILITOT 1.0 0.9 0.7  PROT 7.9 7.0 7.5  ALBUMIN 2.4* 2.0* 2.5*   No results for input(s): "LIPASE", "AMYLASE" in the last 168 hours. CBC: Recent Labs  Lab 05/31/22 1146 06/01/22 0514 06/02/22 0223 06/03/22 0659 06/04/22 0115 06/05/22 0335 06/06/22 0034  WBC 9.4   < > 11.5* 8.2 9.0 9.2 9.4  NEUTROABS 8.2*  --   --   --   --   --   --   HGB 9.6*   < > 9.9* 8.4* 8.0* 8.0* 8.0*  HCT 29.9*   < > 30.0* 24.9* 24.9* 24.1* 24.1*  MCV 87.7   < > 85.7 85.6 86.5 85.5 85.8  PLT 357   < > 308 275 257 267 257   < > = values in this interval not displayed.   Blood Culture    Component Value Date/Time   SDES URINE, CLEAN CATCH 12/28/2021 0250    SPECREQUEST  12/28/2021 0250    NONE Performed at [facility] [address] [place], [place] [address]    CULT (A) 12/28/2021 0250    >=100,000 COLONIES/mL MULTIPLE SPECIES PRESENT, SUGGEST RECOLLECTION   REPTSTATUS 12/29/2021 FINAL 12/28/2021 0250    Cardiac Enzymes: No results for input(s): "CKTOTAL", "CKMB", "CKMBINDEX", "TROPONINI" in the last 168 hours. CBG: No results for input(s): "GLUCAP" in the last 168 hours. Iron Studies: No results for input(s): "IRON", "TIBC", "TRANSFERRIN", "FERRITIN" in the last 72 hours. '@lablastinr3'$ @ Studies/Results: No results found. Medications:    darbepoetin (ARANESP) injection - NON-DIALYSIS  60 mcg Subcutaneous Q Thu-1800   feeding supplement (GLUCERNA SHAKE)  237 mL Oral TID BM   ferrous sulfate  325 mg Oral Q breakfast   heparin  5,000 Units Subcutaneous Q8H   isosorbide-hydrALAZINE  1 tablet Oral BID  latanoprost  1 drop Both Eyes QHS   levothyroxine  50 mcg Oral Q0600   metoprolol succinate  12.5 mg Oral Daily   pantoprazole  40 mg Oral Daily   sodium bicarbonate  1,300 mg Oral BID   sodium chloride  1 g Oral TID WC   tamsulosin  0.4 mg Oral Daily   timolol  1 drop Both Eyes Daily    Assessment/Plan  AKI on CKD4: likely in the setting of volume overload; baseline Cr ~2.5, up to 3.7 in setting of diuresis. 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 - has diuresed about 2.5L and appears euvolemic now -was on lasix 40 BID prior to admission -with Cr stable at 3.6 today would hold diuretics today given acceptable volume status, will likely resume lasix '40mg'$  PO BID tomorrow (or today depending on Na, see below) - renal US to make sure no obstruction  - busy [place] with lots of cysts and staghorn calculi but read as stable c/w 12/2021 - no indication for RRT at present   2.  Acute on chronic hyponatremia:             - failed OP  diuretics. Chronically, his sodium has been in the high 120's to low 130s             - IV diuresed - improved to 129 given clinical improvement in volume status but now down to 127 overnight. Will repeat stat Na now. On salt tabs, may need to resume his outpatient lasix '40mg'$  BID   3.  Hypokalemia:             - replete PRN as this will help with osmolality  4.  Anemia:  Hb 8. 12/18 iron sat 31%; ESA being arranged - will dose here.    4.  Dispo: admitted  - PT said looks good.  Pall has been consulted - wishes to continue full scope of care at this time.  Hopefully he can go home soon.  Will need follow up with me soon after discharge.  Gean Quint, MD [facility]

## 2022-06-06 NOTE — Plan of Care (Signed)
Na down to 126 off diuretics. Will resume diuretics: lasix '40mg'$  PO daily. C/w salt tabs and fluid restriction of 1.2L/day. Would not recommend discharge until his Na has improved. CTM for now  Gean Quint, MD [facility]

## 2022-06-07 DIAGNOSIS — N184 Chronic kidney disease, stage 4 (severe): Secondary | ICD-10-CM | POA: Diagnosis not present

## 2022-06-07 DIAGNOSIS — N179 Acute kidney failure, unspecified: Secondary | ICD-10-CM | POA: Diagnosis not present

## 2022-06-07 DIAGNOSIS — E871 Hypo-osmolality and hyponatremia: Secondary | ICD-10-CM | POA: Diagnosis not present

## 2022-06-07 DIAGNOSIS — E876 Hypokalemia: Secondary | ICD-10-CM | POA: Diagnosis not present

## 2022-06-07 LAB — CBC
HCT: 24.5 % — ABNORMAL LOW (ref 39.0–52.0)
Hemoglobin: 8.4 g/dL — ABNORMAL LOW (ref 13.0–17.0)
MCH: 29.2 pg (ref 26.0–34.0)
MCHC: 34.3 g/dL (ref 30.0–36.0)
MCV: 85.1 fL (ref 80.0–100.0)
Platelets: 267 10*3/uL (ref 150–400)
RBC: 2.88 MIL/uL — ABNORMAL LOW (ref 4.22–5.81)
RDW: 17 % — ABNORMAL HIGH (ref 11.5–15.5)
WBC: 8 10*3/uL (ref 4.0–10.5)
nRBC: 0 % (ref 0.0–0.2)

## 2022-06-07 LAB — URINALYSIS, ROUTINE W REFLEX MICROSCOPIC
Bacteria, UA: NONE SEEN
Bilirubin Urine: NEGATIVE
Glucose, UA: NEGATIVE mg/dL
Ketones, ur: NEGATIVE mg/dL
Nitrite: NEGATIVE
Protein, ur: 100 mg/dL — AB
Specific Gravity, Urine: 1.013 (ref 1.005–1.030)
WBC, UA: >50 WBC/hpf — ABNORMAL HIGH (ref 0–5)
pH: 7 (ref 5.0–8.0)

## 2022-06-07 LAB — BASIC METABOLIC PANEL
Anion gap: 10 (ref 5–15)
BUN: 25 mg/dL — ABNORMAL HIGH (ref 8–23)
CO2: 27 mmol/L (ref 22–32)
Calcium: 8.2 mg/dL — ABNORMAL LOW (ref 8.9–10.3)
Chloride: 90 mmol/L — ABNORMAL LOW (ref 98–111)
Creatinine, Ser: 3.71 mg/dL — ABNORMAL HIGH (ref 0.61–1.24)
GFR, Estimated: 15 mL/min — ABNORMAL LOW (ref >60)
Glucose, Bld: 96 mg/dL (ref 70–99)
Potassium: 4 mmol/L (ref 3.5–5.1)
Sodium: 127 mmol/L — ABNORMAL LOW (ref 135–145)

## 2022-06-07 NOTE — Progress Notes (Signed)
[facility] Progress Note   Subjective:   patient seen and examined bedside. Wife, daughter, sobn at bedside. No acute events. No complaints. I talked to him about going home today but after discussing with him and family, decided to stay another day to make sure his labs are at least stable. Uop 1L  Objective Vitals:   06/07/22 0000 06/07/22 0310 06/07/22 0747 06/07/22 1133  BP: 131/74 139/63 132/67 106/70  Pulse: 65 60 72 76  Resp: '20 18 18 19  '$ Temp: 97.6 F (36.4 C) 97.8 F (36.6 C) 97.7 F (36.5 C) (!) 97.4 F (36.3 C)  TempSrc: Oral Oral Oral Oral  SpO2: 99% 96% 97% 95%  Weight:      Height:       Physical Exam General: elderly man, laying flat in bed Heart: RRR, no rub Lungs: cta b/l Abdomen: soft, nt, slightly distended Extremities: no edema Neuro: awake, alert GU purewick  Additional Objective Labs: Basic Metabolic Panel: Recent Labs  Lab 06/01/22 0514 06/01/22 1831 06/05/22 0335 06/06/22 0034 06/06/22 1239 06/07/22 0225  NA 127*   < > 129* 127* 126* 127*  K 4.2   < > 3.5 3.4*  --  4.0  CL 90*   < > 91* 90*  --  90*  CO2 26   < > 29 25  --  27  GLUCOSE 117*   < > 95 112*  --  96  BUN 16   < > 22 25*  --  25*  CREATININE 3.21*   < > 3.73* 3.63*  --  3.71*  CALCIUM 8.1*   < > 8.1* 8.1*  --  8.2*  PHOS 2.4*  --   --   --   --   --    < > = values in this interval not displayed.   Liver Function Tests: Recent Labs  Lab 06/01/22 0514 06/02/22 0223  AST 17 25  ALT 9 10  ALKPHOS 62 92  BILITOT 0.9 0.7  PROT 7.0 7.5  ALBUMIN 2.0* 2.5*   No results for input(s): "LIPASE", "AMYLASE" in the last 168 hours. CBC: Recent Labs  Lab 06/03/22 0659 06/04/22 0115 06/05/22 0335 06/06/22 0034 06/07/22 0225  WBC 8.2 9.0 9.2 9.4 8.0  HGB 8.4* 8.0* 8.0* 8.0* 8.4*  HCT 24.9* 24.9* 24.1* 24.1* 24.5*  MCV 85.6 86.5 85.5 85.8 85.1  PLT 275 257 267 257 267   Blood Culture    Component Value Date/Time   SDES URINE, CLEAN CATCH 12/28/2021  0250   SPECREQUEST  12/28/2021 0250    NONE Performed at [facility] [address] [place], [place] [address]    CULT (A) 12/28/2021 0250    >=100,000 COLONIES/mL MULTIPLE SPECIES PRESENT, SUGGEST RECOLLECTION   REPTSTATUS 12/29/2021 FINAL 12/28/2021 0250    Cardiac Enzymes: No results for input(s): "CKTOTAL", "CKMB", "CKMBINDEX", "TROPONINI" in the last 168 hours. CBG: No results for input(s): "GLUCAP" in the last 168 hours. Iron Studies: No results for input(s): "IRON", "TIBC", "TRANSFERRIN", "FERRITIN" in the last 72 hours. '@lablastinr3'$ @ Studies/Results: No results found. Medications:    darbepoetin (ARANESP) injection - NON-DIALYSIS  60 mcg Subcutaneous Q Thu-1800   feeding supplement (GLUCERNA SHAKE)  237 mL Oral TID BM   ferrous sulfate  325 mg Oral Q breakfast   furosemide  40 mg Oral Daily   heparin  5,000 Units Subcutaneous Q8H   isosorbide-hydrALAZINE  1 tablet Oral BID   latanoprost  1 drop Both Eyes QHS  levothyroxine  50 mcg Oral Q0600   metoprolol succinate  12.5 mg Oral Daily   pantoprazole  40 mg Oral Daily   sodium bicarbonate  1,300 mg Oral BID   sodium chloride  1 g Oral TID WC   tamsulosin  0.4 mg Oral Daily   timolol  1 drop Both Eyes Daily    Assessment/Plan  AKI on CKD4: likely in the setting of volume overload; baseline Cr ~2.5, up to 3.7 in setting of diuresis. 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 - has diuresed about 2.5L and appears euvolemic now -was on lasix 40 BID prior to admission -Cr stable at 3.7 today - renal US to make sure no obstruction  - busy [place] with lots of cysts and staghorn calculi but read as stable c/w 12/2021 - no indication for RRT at present. Fritz Pickerel, [name], and I have broached the topic of renal replacement therapy in the office and we did advance conversations today. My biggest concern in regards to dialysis for him is  his underlying functional status and age. Ultimately, the decision is up to him and I did encourage him and his family to at least discuss this but fortunately, there is no indication for RRT presently    2.  Acute on chronic hyponatremia:             - failed OP diuretics. Chronically, his sodium has been in the high 120's to low 130s             - IV diuresed - initially improved to 129 given clinical improvement in volume status but now down to 127 yesterday and then 126 later therefore I had restarted his lasix '40mg'$  but at once daily at that time. Will continue this for now. Stressed fluid restriction to patient   3.  Hypokalemia:             - replete PRN as this will help with osmolality  4.  Anemia:  Hb 8. 12/18 iron sat 31%; ESA being arranged - receiving here  5. Metabolic acidosis -on nahco3 supplementation   Dispo: admitted  - PT said looks good.  Pall has been consulted - wishes to continue full scope of care at this time.  Hopefully he can go home in the next day.  Will need follow up with me soon after discharge.  Gean Quint, MD [facility]

## 2022-06-07 NOTE — Progress Notes (Signed)
PROGRESS NOTE    Rick [name]  TDV:[id number] DOB: Jul 04, 1928 DOA: 05/31/2022 PCP: Rick Ghent, MD   Brief Narrative:  Rick [name] is a 88 y.o. male with a history of CKD stage IV secondary to polycystic kidney disease, hypertension, hyperlipidemia, hypothyroidism, GERD, prostate cancer s/p prostatectomy. Patient presented secondary to evidence of AKI on outpatient metabolic panel. Patient with evidence of fluid overload and newly diagnosed systolic heart failure. Patient diuresed successfully with worsening AKI. Nephrology on board.  Assessment and Plan:  AKI on CKD stage IV Baseline creatinine is about 2.5. Creatinine of 3.32 on admission. Secondary to volume overload from acute heart failure. Initial improvement with diuresis and now with worsening creatinine. Renal ultrasound significant for numerous cysts and large left staghorn calculus stable from 12/2021. Creatinine rising slightly, up to 3.71 this morning on BMP after restarting diuretic. -Nephrology recommendations: Lasix -BMP in AM -Will obtain a urinalysis  Hyponatremia Low in 127-128 range and mostly stable. -Nephrology recommendations: fluid restriction, salt tablets, lasix   Hypokalemia Mild. Potassium down to 3.2. Likely secondary to diuresis. Resolved with repletion. -Treat with supplementation as needed  Acute systolic heart failure New diagnosis. LVEF of 30-35% with associated global hypokinesis. Cardiology consulted. Patient diuresed with IV Lasix and is now euvolemic. Lasix dosing as mentioned above.  Mitral regurgitation Noted on Transthoracic Echocardiogram.  Aortic regurgitation Noted on Transthoracic Echocardiogram.   DVT prophylaxis: Heparin subq Code Status:   Code Status: Full Code Family Communication: Wife at bedside Disposition Plan: Discharge home pending nephrology recommendations for discharge secondary to AKI.   Consultants:  Cardiology Nephrology Palliative care  medicine  Procedures:  12/24: Transthoracic Echocardiogram   Antimicrobials: None    Subjective: Patient with no issues overnight. Continues to be eager for discharge.  Objective: BP 132/67 (BP Location: Right Arm)   Pulse 72   Temp 97.7 F (36.5 C) (Oral)   Resp 18   Ht '5\' 7"'$  (1.702 m)   Wt 66.5 kg   SpO2 97%   BMI 22.96 kg/m   Examination:  General exam: Appears calm and comfortable Respiratory system: Clear to auscultation. Respiratory effort normal. Cardiovascular system: S1 & S2 heard, RRR. Gastrointestinal system: Abdomen is nondistended, soft and nontender. Normal bowel sounds heard. Central nervous system: Alert and oriented. No focal neurological deficits. Musculoskeletal: No calf tenderness Skin: No cyanosis. No rashes Psychiatry: Judgement and insight appear normal. Mood & affect appropriate.    Data Reviewed: I have personally reviewed following labs and imaging studies  CBC Lab Results  Component Value Date   WBC 8.0 06/07/2022   RBC 2.88 (L) 06/07/2022   HGB 8.4 (L) 06/07/2022   HCT 24.5 (L) 06/07/2022   MCV 85.1 06/07/2022   MCH 29.2 06/07/2022   PLT 267 06/07/2022   MCHC 34.3 06/07/2022   RDW 17.0 (H) 06/07/2022   LYMPHSABS 0.6 (L) 05/31/2022   MONOABS 0.5 05/31/2022   EOSABS 0.2 05/31/2022   BASOSABS 0.0 12/03/9483     Last metabolic panel Lab Results  Component Value Date   NA 127 (L) 06/07/2022   K 4.0 06/07/2022   CL 90 (L) 06/07/2022   CO2 27 06/07/2022   BUN 25 (H) 06/07/2022   CREATININE 3.71 (H) 06/07/2022   GLUCOSE 96 06/07/2022   GFRNONAA 15 (L) 06/07/2022   GFRAA 49 (L) 01/11/2015   CALCIUM 8.2 (L) 06/07/2022   PHOS 2.4 (L) 06/01/2022   PROT 7.5 06/02/2022   ALBUMIN 2.5 (L) 06/02/2022   LABGLOB 5.2 (H)  08/19/2021   AGRATIO 0.6 (L) 08/19/2021   BILITOT 0.7 06/02/2022   ALKPHOS 92 06/02/2022   AST 25 06/02/2022   ALT 10 06/02/2022   ANIONGAP 10 06/07/2022    GFR: Estimated Creatinine Clearance: 12.1 mL/min (A)  (by C-G formula based on SCr of 3.71 mg/dL (H)).  No results found for this or any previous visit (from the past 240 hour(s)).    Radiology Studies: No results found.    LOS: 7 days    Cordelia Poche, MD [facility] 06/07/2022, 11:25 AM   If 7PM-7AM, please contact night-coverage [email or web address]

## 2022-06-08 DIAGNOSIS — N179 Acute kidney failure, unspecified: Secondary | ICD-10-CM | POA: Diagnosis not present

## 2022-06-08 DIAGNOSIS — N184 Chronic kidney disease, stage 4 (severe): Secondary | ICD-10-CM | POA: Diagnosis not present

## 2022-06-08 DIAGNOSIS — I340 Nonrheumatic mitral (valve) insufficiency: Secondary | ICD-10-CM | POA: Diagnosis not present

## 2022-06-08 DIAGNOSIS — I5041 Acute combined systolic (congestive) and diastolic (congestive) heart failure: Secondary | ICD-10-CM | POA: Diagnosis not present

## 2022-06-08 LAB — CBC
HCT: 22.4 % — ABNORMAL LOW (ref 39.0–52.0)
Hemoglobin: 7.8 g/dL — ABNORMAL LOW (ref 13.0–17.0)
MCH: 29.4 pg (ref 26.0–34.0)
MCHC: 34.8 g/dL (ref 30.0–36.0)
MCV: 84.5 fL (ref 80.0–100.0)
Platelets: 272 10*3/uL (ref 150–400)
RBC: 2.65 MIL/uL — ABNORMAL LOW (ref 4.22–5.81)
RDW: 17.2 % — ABNORMAL HIGH (ref 11.5–15.5)
WBC: 9 10*3/uL (ref 4.0–10.5)
nRBC: 0 % (ref 0.0–0.2)

## 2022-06-08 LAB — BASIC METABOLIC PANEL
Anion gap: 12 (ref 5–15)
BUN: 28 mg/dL — ABNORMAL HIGH (ref 8–23)
CO2: 24 mmol/L (ref 22–32)
Calcium: 8 mg/dL — ABNORMAL LOW (ref 8.9–10.3)
Chloride: 90 mmol/L — ABNORMAL LOW (ref 98–111)
Creatinine, Ser: 3.7 mg/dL — ABNORMAL HIGH (ref 0.61–1.24)
GFR, Estimated: 15 mL/min — ABNORMAL LOW (ref >60)
Glucose, Bld: 94 mg/dL (ref 70–99)
Potassium: 3.5 mmol/L (ref 3.5–5.1)
Sodium: 126 mmol/L — ABNORMAL LOW (ref 135–145)

## 2022-06-08 LAB — HEPATIC FUNCTION PANEL
ALT: 8 U/L (ref 0–44)
AST: 15 U/L (ref 15–41)
Albumin: 2.1 g/dL — ABNORMAL LOW (ref 3.5–5.0)
Alkaline Phosphatase: 53 U/L (ref 38–126)
Bilirubin, Direct: 0.3 mg/dL — ABNORMAL HIGH (ref 0.0–0.2)
Indirect Bilirubin: 0.5 mg/dL (ref 0.3–0.9)
Total Bilirubin: 0.8 mg/dL (ref 0.3–1.2)
Total Protein: 6.5 g/dL (ref 6.5–8.1)

## 2022-06-08 LAB — SODIUM
Sodium: 127 mmol/L — ABNORMAL LOW (ref 135–145)
Sodium: 126 mmol/L — ABNORMAL LOW (ref 135–145)

## 2022-06-08 LAB — OSMOLALITY, URINE: Osmolality, Ur: 328 mOsm/kg (ref 300–900)

## 2022-06-08 MED ORDER — TOLVAPTAN 15 MG PO TABS
7.5000 mg | ORAL_TABLET | Freq: Once | ORAL | Status: AC
  Administered 2022-06-08: 7.5 mg via ORAL
  Filled 2022-06-08: qty 1

## 2022-06-08 NOTE — Progress Notes (Signed)
PROGRESS NOTE    Rick [name]  PJA:[id number] DOB: Jun 30, 1928 DOA: 05/31/2022 PCP: Tonia Ghent, MD   Brief Narrative:  Rick [name] is a 88 y.o. male with a history of CKD stage IV secondary to polycystic kidney disease, hypertension, hyperlipidemia, hypothyroidism, GERD, prostate cancer s/p prostatectomy. Patient presented secondary to evidence of AKI on outpatient metabolic panel. Patient with evidence of fluid overload and newly diagnosed systolic heart failure. Patient diuresed successfully with worsening AKI. Nephrology on board.  Assessment and Plan:  AKI on CKD stage IV Baseline creatinine is about 2.5. Creatinine of 3.32 on admission. Secondary to volume overload from acute heart failure. Initial improvement with diuresis and now with worsening creatinine. Renal ultrasound significant for numerous cysts and large left staghorn calculus stable from 12/2021. Creatinine stable. -Nephrology recommendations: Lasix  Hyponatremia Low in 127-128 mostly, but trended down to 126 today. -Nephrology recommendations: fluid restriction, salt tablets, lasix, tolvaptan  Hypokalemia Mild. Potassium down to 3.2. Likely secondary to diuresis. Resolved with repletion. -Treat with supplementation as needed  Acute systolic heart failure New diagnosis. LVEF of 30-35% with associated global hypokinesis. Cardiology consulted. Patient diuresed with IV Lasix and is now euvolemic. Lasix dosing as mentioned above.  Mitral regurgitation Noted on Transthoracic Echocardiogram.  Aortic regurgitation Noted on Transthoracic Echocardiogram.   DVT prophylaxis: Heparin subq Code Status:   Code Status: Full Code Family Communication: Wife at bedside Disposition Plan: Discharge home pending nephrology recommendations for discharge secondary to AKI and hyponatremia. Likely discharge in 1 day   Consultants:  Cardiology Nephrology Palliative care medicine  Procedures:  12/24: Transthoracic  Echocardiogram   Antimicrobials: None    Subjective: No concerns this morning.  Objective: BP 136/83 (BP Location: Left Arm)   Pulse 72   Temp 97.8 F (36.6 C) (Oral)   Resp 20   Ht '5\' 7"'$  (1.702 m)   Wt 66.5 kg   SpO2 92%   BMI 22.96 kg/m   Examination:  General exam: Appears calm and comfortable  Respiratory system: Clear to auscultation. Respiratory effort normal. Cardiovascular system: S1 & S2 heard Gastrointestinal system: Abdomen is nondistended, soft and nontender. Normal bowel sounds heard. Central nervous system: Alert and oriented. No focal neurological deficits. Psychiatry: Judgement and insight appear normal. Mood & affect appropriate.    Data Reviewed: I have personally reviewed following labs and imaging studies  CBC Lab Results  Component Value Date   WBC 9.0 06/08/2022   RBC 2.65 (L) 06/08/2022   HGB 7.8 (L) 06/08/2022   HCT 22.4 (L) 06/08/2022   MCV 84.5 06/08/2022   MCH 29.4 06/08/2022   PLT 272 06/08/2022   MCHC 34.8 06/08/2022   RDW 17.2 (H) 06/08/2022   LYMPHSABS 0.6 (L) 05/31/2022   MONOABS 0.5 05/31/2022   EOSABS 0.2 05/31/2022   BASOSABS 0.0 34/19/3790     Last metabolic panel Lab Results  Component Value Date   NA 126 (L) 06/08/2022   K 3.5 06/08/2022   CL 90 (L) 06/08/2022   CO2 24 06/08/2022   BUN 28 (H) 06/08/2022   CREATININE 3.70 (H) 06/08/2022   GLUCOSE 94 06/08/2022   GFRNONAA 15 (L) 06/08/2022   GFRAA 49 (L) 01/11/2015   CALCIUM 8.0 (L) 06/08/2022   PHOS 2.4 (L) 06/01/2022   PROT 7.5 06/02/2022   ALBUMIN 2.5 (L) 06/02/2022   LABGLOB 5.2 (H) 08/19/2021   AGRATIO 0.6 (L) 08/19/2021   BILITOT 0.7 06/02/2022   ALKPHOS 92 06/02/2022   AST 25 06/02/2022  ALT 10 06/02/2022   ANIONGAP 12 06/08/2022    GFR: Estimated Creatinine Clearance: 12.2 mL/min (A) (by C-G formula based on SCr of 3.7 mg/dL (H)).  No results found for this or any previous visit (from the past 240 hour(s)).    Radiology Studies: No results  found.    LOS: 8 days    Cordelia Poche, MD [facility] 06/08/2022, 10:50 AM   If 7PM-7AM, please contact night-coverage [email or web address]

## 2022-06-08 NOTE — Progress Notes (Signed)
[facility] Progress Note   Subjective:   patient seen and examined bedside. Wife, daughter at bedside. Na down to 126 this AM unfortunately. He does report that his abdomen is more distended today. He reports that it will likely improve once he has a bowel movement which he is currently struggling to have (currently on a bedpan). No other complaints. We discussed tolvaptan x 1 dose today, discussed risks/SE profile. Family and patient agree with moving forward.  Objective Vitals:   06/07/22 1632 06/07/22 2000 06/07/22 2325 06/08/22 0315  BP: 139/81 (!) 146/80 108/70 136/83  Pulse: 70 68 75 72  Resp: '18 18 20 20  '$ Temp: (!) 97.5 F (36.4 C) 97.7 F (36.5 C) 97.8 F (36.6 C) 97.8 F (36.6 C)  TempSrc: Oral Oral Oral Oral  SpO2: 96% 97% 95% 92%  Weight:      Height:       Physical Exam General: elderly man, laying flat in bed currently on bed pan, comfortable Heart: RRR, no rub Lungs: cta b/l Abdomen: soft, nt, distended, no rebound Extremities: no edema Neuro: awake, alert GU purewick  Additional Objective Labs: Basic Metabolic Panel: Recent Labs  Lab 06/06/22 0034 06/06/22 1239 06/07/22 0225 06/08/22 0303  NA 127* 126* 127* 126*  K 3.4*  --  4.0 3.5  CL 90*  --  90* 90*  CO2 25  --  27 24  GLUCOSE 112*  --  96 94  BUN 25*  --  25* 28*  CREATININE 3.63*  --  3.71* 3.70*  CALCIUM 8.1*  --  8.2* 8.0*   Liver Function Tests: Recent Labs  Lab 06/02/22 0223  AST 25  ALT 10  ALKPHOS 92  BILITOT 0.7  PROT 7.5  ALBUMIN 2.5*   No results for input(s): "LIPASE", "AMYLASE" in the last 168 hours. CBC: Recent Labs  Lab 06/04/22 0115 06/05/22 0335 06/06/22 0034 06/07/22 0225 06/08/22 0303  WBC 9.0 9.2 9.4 8.0 9.0  HGB 8.0* 8.0* 8.0* 8.4* 7.8*  HCT 24.9* 24.1* 24.1* 24.5* 22.4*  MCV 86.5 85.5 85.8 85.1 84.5  PLT 257 267 257 267 272   Blood Culture    Component Value Date/Time   SDES URINE, CLEAN CATCH 12/28/2021 0250   SPECREQUEST   12/28/2021 0250    NONE Performed at [facility] [address], [place], [place] [address]    CULT (A) 12/28/2021 0250    >=100,000 COLONIES/mL MULTIPLE SPECIES PRESENT, SUGGEST RECOLLECTION   REPTSTATUS 12/29/2021 FINAL 12/28/2021 0250    Cardiac Enzymes: No results for input(s): "CKTOTAL", "CKMB", "CKMBINDEX", "TROPONINI" in the last 168 hours. CBG: No results for input(s): "GLUCAP" in the last 168 hours. Iron Studies: No results for input(s): "IRON", "TIBC", "TRANSFERRIN", "FERRITIN" in the last 72 hours. '@lablastinr3'$ @ Studies/Results: No results found. Medications:    darbepoetin (ARANESP) injection - NON-DIALYSIS  60 mcg Subcutaneous Q Thu-1800   feeding supplement (GLUCERNA SHAKE)  237 mL Oral TID BM   ferrous sulfate  325 mg Oral Q breakfast   furosemide  40 mg Oral Daily   heparin  5,000 Units Subcutaneous Q8H   isosorbide-hydrALAZINE  1 tablet Oral BID   latanoprost  1 drop Both Eyes QHS   levothyroxine  50 mcg Oral Q0600   metoprolol succinate  12.5 mg Oral Daily   pantoprazole  40 mg Oral Daily   sodium bicarbonate  1,300 mg Oral BID   sodium chloride  1 g Oral TID WC   tamsulosin  0.4 mg  Oral Daily   timolol  1 drop Both Eyes Daily   tolvaptan  7.5 mg Oral Once    Assessment/Plan  AKI on CKD4: likely in the setting of volume overload; baseline Cr ~2.5, up to 3.7 in setting of diuresis. 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 - has diuresed about 2.5L and appears euvolemic now -was on lasix 40 BID prior to admission -Cr stable at 3.7 today - renal US to make sure no obstruction  - busy [place] with lots of cysts and staghorn calculi but read as stable c/w 12/2021 - no indication for RRT at present. Fritz Pickerel, [name], and I have broached the topic of renal replacement therapy in the office and we did advance conversations yesterday. My biggest concern in regards to  dialysis for him is his underlying functional status and age. Ultimately, the decision is up to him and I did encourage him and his family to at least discuss this but fortunately, there is no indication for RRT presently    2.  Acute on chronic hyponatremia:             - initially hypervolemic hyponatremia failed OP diuretics. Chronically, his sodium has been in the high  120's to low 130s             - IV diuresed w/ improvement initially - initially improved to 129 given clinical improvement in volume status but now down to 126 from 127 yesterday. Currently on Lasix '40mg'$  PO daily. 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 Fluid restriction d/c'ed (can hold off on any fluid restriction for 24 hrs). LFTs 12/25 WNL, repeat today per protocol. Na check now and then Q8H x 1 day.   3.  Hypokalemia:             - replete PRN as this will help with osmolality  4.  Anemia:  Hb 8. 12/18 iron sat 31%; ESA being arranged - receiving here. Hgb down to 7.8 today and with abd distension today. Nontender on exam. If no improvement despite bowel movement then would recommend further imaging to reassess this  5. Metabolic acidosis -on nahco3 supplementation, bicarb stable   Dispo: admitted  - PT said looks good.  Pall has been consulted - wishes to continue full scope of care at this time.   Gean Quint, MD [facility]

## 2022-06-08 NOTE — Progress Notes (Signed)
MEDICATION RELATED CONSULT NOTE - INITIAL   Pharmacy Consult for tolvaptan monitoring  Indication: low sodium   Allergies  Allergen Reactions   Lisinopril     cough   Nsaids     Elevated Cr    Patient Measurements: Height: '5\' 7"'$  (170.2 cm) Weight: 66.5 kg (146 lb 9.7 oz) IBW/kg (Calculated) : 66.1 Adjusted Body Weight: 66.5 kg   Vital Signs: Temp: 97.8 F (36.6 C) (12/31 0315) Temp Source: Oral (12/31 0315) BP: 136/83 (12/31 0315) Pulse Rate: 72 (12/31 0315)   Labs: Recent Labs    06/06/22 0034 06/07/22 0225 06/08/22 0303  WBC 9.4 8.0 9.0  HGB 8.0* 8.4* 7.8*  HCT 24.1* 24.5* 22.4*  PLT 257 267 272  CREATININE 3.63* 3.71* 3.70*   Estimated Creatinine Clearance: 12.2 mL/min (A) (by C-G formula based on SCr of 3.7 mg/dL (H)).   Medical History: Past Medical History:  Diagnosis Date   Arthritis    GERD (gastroesophageal reflux disease)    History of colon polyps    History of prostate cancer    Hyperlipidemia    Hypertension    Kidney cysts    bilateral.  Renal US   Mitral regurgitation 12/02/2020    Assessment: Rick [name] is a 65 YOM who has persistently low NA resistant to salt tabs, diuretics, and fluid restrictions. Pharmacy has been consulted to monitor tolvaptan administration.   Goal of Therapy:  Increase sodium no more than 94mq in 8 hours of or 12 mEq in 24 hours   Plan:  Give tolvaptan dose 7.5 mg x 1  Follow up sodium every 8 hours for 24 hours and then daily   HVicenta Dunning PharmD  PGY1 Pharmacy Resident

## 2022-06-09 DIAGNOSIS — E876 Hypokalemia: Secondary | ICD-10-CM | POA: Diagnosis not present

## 2022-06-09 LAB — COMPREHENSIVE METABOLIC PANEL
ALT: 7 U/L (ref 0–44)
AST: 12 U/L — ABNORMAL LOW (ref 15–41)
Albumin: 2.1 g/dL — ABNORMAL LOW (ref 3.5–5.0)
Alkaline Phosphatase: 60 U/L (ref 38–126)
Anion gap: 12 (ref 5–15)
BUN: 27 mg/dL — ABNORMAL HIGH (ref 8–23)
CO2: 25 mmol/L (ref 22–32)
Calcium: 8.3 mg/dL — ABNORMAL LOW (ref 8.9–10.3)
Chloride: 91 mmol/L — ABNORMAL LOW (ref 98–111)
Creatinine, Ser: 3.88 mg/dL — ABNORMAL HIGH (ref 0.61–1.24)
GFR, Estimated: 14 mL/min — ABNORMAL LOW (ref >60)
Glucose, Bld: 103 mg/dL — ABNORMAL HIGH (ref 70–99)
Potassium: 4.2 mmol/L (ref 3.5–5.1)
Sodium: 128 mmol/L — ABNORMAL LOW (ref 135–145)
Total Bilirubin: 0.8 mg/dL (ref 0.3–1.2)
Total Protein: 6.6 g/dL (ref 6.5–8.1)

## 2022-06-09 LAB — CBC
HCT: 24.1 % — ABNORMAL LOW (ref 39.0–52.0)
Hemoglobin: 8.1 g/dL — ABNORMAL LOW (ref 13.0–17.0)
MCH: 28.7 pg (ref 26.0–34.0)
MCHC: 33.6 g/dL (ref 30.0–36.0)
MCV: 85.5 fL (ref 80.0–100.0)
Platelets: 306 10*3/uL (ref 150–400)
RBC: 2.82 MIL/uL — ABNORMAL LOW (ref 4.22–5.81)
RDW: 17.3 % — ABNORMAL HIGH (ref 11.5–15.5)
WBC: 8.9 10*3/uL (ref 4.0–10.5)
nRBC: 0 % (ref 0.0–0.2)

## 2022-06-09 MED ORDER — SODIUM CHLORIDE 1 G PO TABS: 1.0000 g | ORAL_TABLET | Freq: Three times a day (TID) | ORAL | 0 refills | Status: AC

## 2022-06-09 MED ORDER — FUROSEMIDE 40 MG PO TABS: 40.0000 mg | ORAL_TABLET | Freq: Every day | ORAL | 2 refills | Status: DC

## 2022-06-09 MED ORDER — GUAIFENESIN-DM 100-10 MG/5ML PO SYRP
5.0000 mL | ORAL_SOLUTION | ORAL | Status: DC | PRN
  Administered 2022-06-09: 5 mL via ORAL
  Filled 2022-06-09: qty 5

## 2022-06-09 MED ORDER — ISOSORB DINITRATE-HYDRALAZINE 20-37.5 MG PO TABS: 1.0000 | ORAL_TABLET | Freq: Two times a day (BID) | ORAL | 2 refills | Status: DC

## 2022-06-09 MED ORDER — METOPROLOL SUCCINATE ER 25 MG PO TB24: 12.5000 mg | ORAL_TABLET | Freq: Every day | ORAL | 2 refills | Status: DC

## 2022-06-09 NOTE — Progress Notes (Signed)
Physical Therapy Treatment Patient Details Name: Rick [name] MRN: [id number] DOB: 1929/05/12 Today's Date: 06/09/2022   History of Present Illness Pt is 89 year old presented to [facility]  05/31/22 with hypokalemia, hyponatremia, and AKI. Pt also found to have acute combined systolic and diastolic heart failure. PMH - CKD, moderate to severe MR, LVH, pulmonary hypertension, HTN, prostate CA, pelvic fx    PT Comments    Pt focused on d/c home and need to have a BM this date. Pt is close guard to supervision for room distance mobility today, does require some assist to rise and sit from low surface of toilet but per pt and daughter in room pt's wife assists with this normally. Pt declines further gait in hallway because he wants to "save my energy" for d/c home.      Recommendations for follow up therapy are one component of a multi-disciplinary discharge planning process, led by the attending physician.  Recommendations may be updated based on patient status, additional functional criteria and insurance authorization.  Follow Up Recommendations  No PT follow up     Assistance Recommended at Discharge Intermittent Supervision/Assistance  Patient can return home with the following A little help with walking and/or transfers;A little help with bathing/dressing/bathroom   Equipment Recommendations  None recommended by PT    Recommendations for Other Services       Precautions / Restrictions Precautions Precautions: Fall Restrictions Weight Bearing Restrictions: No     Mobility  Bed Mobility Overal bed mobility: Needs Assistance Bed Mobility: Supine to Sit, Sit to Supine     Supine to sit: Supervision, HOB elevated Sit to supine: Supervision, HOB elevated   General bed mobility comments: Incr time and effort    Transfers Overall transfer level: Needs assistance Equipment used: Rolling walker (2 wheels) Transfers: Sit to/from Stand Sit to Stand: Min guard, Min assist            General transfer comment: for safety to stand and light assist for eccentric lower onto low surface (toilet). STS x4 throughout session    Ambulation/Gait Ambulation/Gait assistance: Min guard Gait Distance (Feet): 25 Feet Assistive device: Rolling walker (2 wheels) Gait Pattern/deviations: Step-through pattern, Decreased stride length, Trunk flexed Gait velocity: decr     General Gait Details: close guard for safety, cues for placement in RW and slowing down   Stairs             Wheelchair Mobility    Modified Rankin (Stroke Patients Only)       Balance Overall balance assessment: Needs assistance Sitting-balance support: No upper extremity supported, Feet supported Sitting balance-Leahy Scale: Good     Standing balance support: Bilateral upper extremity supported, During functional activity Standing balance-Leahy Scale: Poor Standing balance comment: reliant on exteral support                            Cognition Arousal/Alertness: Awake/alert Behavior During Therapy: WFL for tasks assessed/performed Overall Cognitive Status: Within Functional Limits for tasks assessed                                          Exercises      General Comments        Pertinent Vitals/Pain Pain Assessment Pain Assessment: No/denies pain    Home Living  Prior Function            PT Goals (current goals can now be found in the care plan section) Acute Rehab PT Goals Patient Stated Goal: return home PT Goal Formulation: With patient/family Time For Goal Achievement: 06/18/22 Potential to Achieve Goals: Good Progress towards PT goals: Progressing toward goals    Frequency    Min 2X/week      PT Plan Current plan remains appropriate    Co-evaluation              AM-PAC PT "6 Clicks" Mobility   Outcome Measure  Help needed turning from your back to your side while in a flat  bed without using bedrails?: None Help needed moving from lying on your back to sitting on the side of a flat bed without using bedrails?: None Help needed moving to and from a bed to a chair (including a wheelchair)?: A Little Help needed standing up from a chair using your arms (e.g., wheelchair or bedside chair)?: A Little Help needed to walk in hospital room?: A Little Help needed climbing 3-5 steps with a railing? : A Little 6 Click Score: 20    End of Session   Activity Tolerance: Patient tolerated treatment well;Patient limited by fatigue Patient left: in bed;with call bell/phone within reach;with bed alarm set;with family/visitor present Nurse Communication: Mobility status PT Visit Diagnosis: Other abnormalities of gait and mobility (R26.89);Muscle weakness (generalized) (M62.81)     Time: 4097-3532 PT Time Calculation (min) (ACUTE ONLY): 20 min  Charges:  $Therapeutic Activity: 8-22 mins                     Stacie Glaze, PT DPT Acute Rehabilitation Services Pager [phone]  Office [phone]    Roxine Caddy E Ruffin Pyo 06/09/2022, 4:15 PM

## 2022-06-09 NOTE — Discharge Instructions (Addendum)
Rick [name],  You were in the hospital with heart failure, acute kidney injury and low sodium.  Heart failure: you were treated with a medication to make you urinate fluid which helped to stabilize your heart. Please start taking Imdur and metoprolol (Toprol XL) for management of your heart and blood pressure. Please follow-up with your cardiologist Acute kidney injury: this was secondary to too much fluid built up in your body. This improved with treatment of your heart failure. Please follow-up with the kidney doctor, Dr. Candiss Norse Low sodium (hyponatremia): this is likely related to your fluid issues. Please continue drinking no more than 1200 mL (1.2 L) and continue taking Lasix and salt tablets. Please follow-up with your PCP for repeat lab work.

## 2022-06-09 NOTE — Discharge Summary (Signed)
Physician Discharge Summary   Patient: Rick [name] MRN: [id number] DOB: Aug 22, 1928  Admit date:     05/31/2022  Discharge date: 06/09/22  Discharge Physician: Cordelia Poche, MD   PCP: Tonia Ghent, MD   Recommendations at discharge:  Hospital follow-up with PCP Repeat BMP/CBC in 3-5 days Cardiology follow-up Nephrology follow-up Recommend continued goals of care discussions  Discharge Diagnoses: Principal Problem:   Hypokalemia Active Problems:   AKI (acute kidney injury) ([place]) superimposed on CKD stage IV   Stage 4 chronic kidney disease ([place])   Essential hypertension   GERD without esophagitis   Anemia, iron deficiency   Severe mitral regurgitation   Hyponatremia   Hypoalbuminemia due to protein-calorie malnutrition ([place])   Acquired hypothyroidism   BPH (benign prostatic hyperplasia)   Acute combined systolic and diastolic heart failure ([place])   DNR (do not resuscitate) discussion  Resolved Problems:   * No resolved hospital problems. *  Hospital Course: Rick [name] is a 89 y.o. male with a history of CKD stage IV secondary to polycystic kidney disease, hypertension, hyperlipidemia, hypothyroidism, GERD, prostate cancer s/p prostatectomy. Patient presented secondary to evidence of AKI on outpatient metabolic panel. Patient with evidence of fluid overload and newly diagnosed systolic heart failure. Patient diuresed successfully with worsening AKI. Nephrology on board. AKI and sodium stabilized. Patient will require lasix and salt tablets for management of hyponatremia. Imdur and Toprol Xl started for heart failure.  Assessment and Plan:  AKI on CKD stage IV Baseline creatinine is about 2.5. Creatinine of 3.32 on admission. Secondary to volume overload from acute heart failure. Initial improvement with diuresis and now with worsening creatinine. Renal ultrasound significant for numerous cysts and large left staghorn calculus stable from 12/2021. Creatinine  stable. -Nephrology recommendations: Lasix   Hyponatremia Low in 127-128 mostly, but trended down to 126. Patient managed with fluid restriction, salt tablets, Lasix and tolvaptan. Nephrology recommending discharge on Lasix and salt tablets.   Hypokalemia Mild. Potassium down to 3.2. Likely secondary to diuresis. Resolved with repletion.   Acute systolic heart failure New diagnosis. LVEF of 30-35% with associated global hypokinesis. Cardiology consulted. Patient diuresed with IV Lasix and is now euvolemic. Lasix dosing as mentioned above. Cardiology recommending Imdur and Toprol XL. Outpatient follow-up.   Mitral regurgitation Noted on Transthoracic Echocardiogram.   Aortic regurgitation Noted on Transthoracic Echocardiogram.   Consultants: Nephrology, Cardiology, Palliative care medicine Procedures performed: 12/24: Transthoracic Echocardiogram   Disposition: Home Diet recommendation: Cardiac diet   DISCHARGE MEDICATION: Allergies as of 06/09/2022       Reactions   Lisinopril    cough   Nsaids    Elevated Cr        Medication List     STOP taking these medications    amLODipine 10 MG tablet Commonly known as: NORVASC       TAKE these medications    acetaminophen 325 MG tablet Commonly known as: TYLENOL Take 2 tablets (650 mg total) by mouth every 6 (six) hours as needed. What changed: reasons to take this   ferrous sulfate 325 (65 FE) MG tablet Take 1 tablet (325 mg total) by mouth daily with breakfast.   furosemide 40 MG tablet Commonly known as: LASIX Take 1 tablet (40 mg total) by mouth daily. Start taking on: June 10, 2022 What changed: when to take this   isosorbide-hydrALAZINE 20-37.5 MG tablet Commonly known as: BIDIL Take 1 tablet by mouth 2 (two) times daily.   latanoprost 0.005 % ophthalmic solution  Commonly known as: XALATAN Place 1 drop into both eyes at bedtime.   levothyroxine 50 MCG tablet Commonly known as: SYNTHROID Take 1  tablet (50 mcg total) by mouth daily. Take on empty stomach early in the morning.   metoprolol succinate 25 MG 24 hr tablet Commonly known as: TOPROL-XL Take 0.5 tablets (12.5 mg total) by mouth daily. Start taking on: June 10, 2022   omeprazole 20 MG tablet Commonly known as: PRILOSEC OTC Take 1 tablet (20 mg total) by mouth daily.   sodium bicarbonate 650 MG tablet Take 2 tablets (1,300 mg total) by mouth 2 (two) times daily.   sodium chloride 1 g tablet Take 1 tablet (1 g total) by mouth 3 (three) times daily with meals.   tamsulosin 0.4 MG Caps capsule Commonly known as: FLOMAX Take 0.4 mg by mouth daily.   timolol 0.5 % ophthalmic solution Commonly known as: BETIMOL Place 1 drop into both eyes 2 (two) times daily.        Follow-up Information     Tonia Ghent, MD. Schedule an appointment as soon as possible for a visit in 1 week(s).   Specialty: Family Medicine Why: For hospital follow-up Contact information: [place] [address] [phone]         Fay Records, MD Follow up on 06/25/2022.   Specialty: Cardiology Contact information: [place] [address] [phone]         Gean Quint, MD Follow up in 1 week(s).   Specialty: Nephrology Why: For hospital follow-up Contact information: [place] [address] [phone]                Discharge Exam: BP 119/86   Pulse 71   Temp 97.9 F (36.6 C) (Oral)   Resp 20   Ht '5\' 7"'$  (1.702 m)   Wt 66.7 kg   SpO2 96%   BMI 23.03 kg/m   General exam: Appears calm and comfortable Respiratory system: Clear to auscultation. Respiratory effort normal. Cardiovascular system: S1 & S2 heard, RRR. No murmurs, rubs, gallops or clicks. Gastrointestinal system: Abdomen is distended, soft and nontender. Normal bowel sounds heard. Central nervous system: Alert and oriented. No focal neurological deficits. Musculoskeletal: No  edema. No calf tenderness Skin: No cyanosis. No rashes Psychiatry: Judgement and insight appear normal. Mood & affect appropriate.   Condition at discharge: stable  The results of significant diagnostics from this hospitalization (including imaging, microbiology, ancillary and laboratory) are listed below for reference.   Imaging Studies: ECHOCARDIOGRAM COMPLETE  Result Date: 06/01/2022    ECHOCARDIOGRAM REPORT   Patient Name:   Rick [name] Date of Exam: 06/01/2022 Medical Rec #:  [id number]       Height:       67.0 in Accession #:    [id number]      Weight:       132.0 lb Date of Birth:  Mar 22, 1929       BSA:          1.695 m Patient Age:    52 years        BP:           127/70 mmHg Patient Gender: M               HR:           89 bpm. Exam Location:  Inpatient Procedure: 2D Echo, Cardiac Doppler and Color Doppler Indications:  CHF-Acute Diastolic O84.16  History:        Patient has prior history of Echocardiogram examinations, most                 recent 12/01/2020. CHF; Risk Factors:Hypertension and                 Dyslipidemia. TOBACCO ABUSE, HX OF, History of prostate cancer.  Sonographer:    Alvino Chapel RCS Referring Phys: [id number] OLADAPO ADEFESO IMPRESSIONS  1. LVEF 30-35% with global hypokinesis. Significant LV dyssynchrony present. Left ventricular ejection fraction, by estimation, is 30 to 35%. The left ventricle has moderately decreased function. The left ventricle demonstrates global hypokinesis. There  is mild concentric left ventricular hypertrophy. Indeterminate diastolic filling due to E-A fusion.  2. Right ventricular systolic function is normal. The right ventricular size is normal.  3. Left atrial size was moderately dilated.  4. Right atrial size was mildly dilated.  5. A small pericardial effusion is present. The pericardial effusion is anterior to the right ventricle.  6. Moderate to severe MR. Eccentric jet. The mitral valve is grossly normal. Moderate to severe mitral valve  regurgitation. No evidence of mitral stenosis.  7. The aortic valve is tricuspid. There is moderate calcification of the aortic valve. There is moderate thickening of the aortic valve. Aortic valve regurgitation is moderate. Mild aortic valve stenosis. Comparison(s): Changes from prior study are noted. LVEF now 30-35%. Moderate to severe MR remains present. AI is moderate. Significant LV dyssynchrony present consistent with LBBB. FINDINGS  Left Ventricle: LVEF 30-35% with global hypokinesis. Significant LV dyssynchrony present. Left ventricular ejection fraction, by estimation, is 30 to 35%. The left ventricle has moderately decreased function. The left ventricle demonstrates global hypokinesis. The left ventricular internal cavity size was normal in size. There is mild concentric left ventricular hypertrophy. Abnormal (paradoxical) septal motion, consistent with left bundle branch block. Indeterminate diastolic filling due to E-A fusion. Right Ventricle: The right ventricular size is normal. No increase in right ventricular wall thickness. Right ventricular systolic function is normal. Left Atrium: Left atrial size was moderately dilated. Right Atrium: Right atrial size was mildly dilated. Pericardium: A small pericardial effusion is present. The pericardial effusion is anterior to the right ventricle. Mitral Valve: Moderate to severe MR. Eccentric jet. The mitral valve is grossly normal. Moderate to severe mitral valve regurgitation. No evidence of mitral valve stenosis. Tricuspid Valve: The tricuspid valve is grossly normal. Tricuspid valve regurgitation is mild . No evidence of tricuspid stenosis. Aortic Valve: The aortic valve is tricuspid. There is moderate calcification of the aortic valve. There is moderate thickening of the aortic valve. Aortic valve regurgitation is moderate. Aortic regurgitation PHT measures 644 msec. Mild aortic stenosis is present. Aortic valve mean gradient measures 8.0 mmHg. Aortic  valve peak gradient measures 14.7 mmHg. Aortic valve area, by VTI measures 1.40 cm. Pulmonic Valve: The pulmonic valve was grossly normal. Pulmonic valve regurgitation is mild. No evidence of pulmonic stenosis. Aorta: The aortic root is normal in size and structure. Venous: The inferior vena cava was not well visualized. IAS/Shunts: The atrial septum is grossly normal.  LEFT VENTRICLE PLAX 2D LVIDd:         5.40 cm      Diastology LVIDs:         4.60 cm      LV e' medial:    7.07 cm/s LV PW:         1.20 cm      LV E/e'  medial:  13.0 LV IVS:        1.40 cm      LV e' lateral:   9.25 cm/s LVOT diam:     1.70 cm      LV E/e' lateral: 9.9 LV SV:         55 LV SV Index:   32 LVOT Area:     2.27 cm  LV Volumes (MOD) LV vol d, MOD A2C: 119.0 ml LV vol d, MOD A4C: 157.0 ml LV vol s, MOD A2C: 76.0 ml LV vol s, MOD A4C: 105.0 ml LV SV MOD A2C:     43.0 ml LV SV MOD A4C:     157.0 ml LV SV MOD BP:      49.3 ml RIGHT VENTRICLE RV S prime:     13.20 cm/s TAPSE (M-mode): 2.0 cm LEFT ATRIUM              Index        RIGHT ATRIUM           Index LA diam:        3.20 cm  1.89 cm/m   RA Area:     24.40 cm LA Vol (A2C):   120.0 ml 70.80 ml/m  RA Volume:   70.00 ml  41.30 ml/m LA Vol (A4C):   49.6 ml  29.26 ml/m LA Biplane Vol: 78.4 ml  46.26 ml/m  AORTIC VALVE AV Area (Vmax):    1.37 cm AV Area (Vmean):   1.49 cm AV Area (VTI):     1.40 cm AV Vmax:           192.00 cm/s AV Vmean:          122.000 cm/s AV VTI:            0.391 m AV Peak Grad:      14.7 mmHg AV Mean Grad:      8.0 mmHg LVOT Vmax:         116.00 cm/s LVOT Vmean:        80.200 cm/s LVOT VTI:          0.241 m LVOT/AV VTI ratio: 0.62 AI PHT:            644 msec  AORTA Ao Root diam: 3.30 cm MITRAL VALVE                  TRICUSPID VALVE MV Area (PHT): 5.38 cm       TR Peak grad:   24.0 mmHg MV Decel Time: 141 msec       TR Vmax:        245.00 cm/s MR Peak grad:    109.4 mmHg MR Mean grad:    57.0 mmHg    SHUNTS MR Vmax:         523.00 cm/s  Systemic VTI:  0.24 m  MR Vmean:        340.0 cm/s   Systemic Diam: 1.70 cm MR PISA:         1.57 cm MR PISA Eff ROA: 9 mm MR PISA Radius:  0.50 cm MV E velocity: 91.70 cm/s Eleonore Chiquito MD Electronically signed by Eleonore Chiquito MD Signature Date/Time: 06/01/2022/4:29:52 PM    Final    US RENAL  Result Date: 06/01/2022 CLINICAL DATA:  Acute kidney injury. EXAM: RENAL / URINARY TRACT ULTRASOUND COMPLETE COMPARISON:  CT, 12/28/2021. FINDINGS: Right Kidney: Renal measurements: 24 x 12 x 22 cm = volume: 3,580 mL. Multiple large  cysts. Largest cyst 26 cm. Minimal renal parenchyma. No convincing hydronephrosis. Left Kidney: Renal measurements: 23.2 x 11.9 x 11.5 cm = volume: 1,655 mL. Numerous cysts. Echogenic shadowing stone in the central kidney consistent with the large staghorn calculus noted on the prior CT. Largest 2 cysts, upper lower pole, 9.5 x 7.7 x 0.8 cm and 9.4 x 7.3 x 7.5 cm respectively. No solid masses. Limited renal parenchyma visualized. No convincing hydronephrosis. Bladder: Not visualized. Other: None. IMPRESSION: 1. Limited sonographic assessment of the kidneys due to the numerous cysts, large renal sizes and limited parenchyma. 2. Overall, stable appearance compared to the CT from 12/28/2021. 3. Large shadowing left mid kidney stone consistent with a large staghorn calculus noted on the prior study. Electronically Signed   By: Lajean Manes M.D.   On: 06/01/2022 15:30   DG Chest 2 View  Result Date: 05/31/2022 CLINICAL DATA:  Evaluation for volume overload. Hyponatremia and peripheral edema. EXAM: CHEST - 2 VIEW COMPARISON:  02/27/2022. FINDINGS: The heart is mildly enlarged and the mediastinal contour is within normal limits. The pulmonary vasculature is normal in appearance. Hyperinflation of the lungs is noted. Mild atelectasis or scarring is present at the left lung base. No effusion or pneumothorax. No acute osseous abnormality. IMPRESSION: 1. Mild cardiomegaly. 2. Mild atelectasis or scarring at the left  lung base. Electronically Signed   By: Brett Fairy M.D.   On: 05/31/2022 22:01    Microbiology: Results for orders placed or performed during the hospital encounter of 12/28/21  Urine Culture     Status: Abnormal   Collection Time: 12/28/21  2:50 AM   Specimen: Urine, Clean Catch  Result Value Ref Range Status   Specimen Description URINE, CLEAN CATCH  Final   Special Requests   Final    NONE Performed at [facility], [place] [address] [place], [place] [address]    Culture (A)  Final    >=100,000 COLONIES/mL MULTIPLE SPECIES PRESENT, SUGGEST RECOLLECTION   Report Status 12/29/2021 FINAL  Final    Labs: CBC: Recent Labs  Lab 06/05/22 0335 06/06/22 0034 06/07/22 0225 06/08/22 0303 06/09/22 0237  WBC 9.2 9.4 8.0 9.0 8.9  HGB 8.0* 8.0* 8.4* 7.8* 8.1*  HCT 24.1* 24.1* 24.5* 22.4* 24.1*  MCV 85.5 85.8 85.1 84.5 85.5  PLT 267 257 267 272 979   Basic Metabolic Panel: Recent Labs  Lab 06/05/22 0335 06/06/22 0034 06/06/22 1239 06/07/22 0225 06/08/22 0303 06/08/22 1125 06/08/22 1903 06/09/22 0237  NA 129* 127*   < > 127* 126* 127* 126* 128*  K 3.5 3.4*  --  4.0 3.5  --   --  4.2  CL 91* 90*  --  90* 90*  --   --  91*  CO2 29 25  --  27 24  --   --  25  GLUCOSE 95 112*  --  96 94  --   --  103*  BUN 22 25*  --  25* 28*  --   --  27*  CREATININE 3.73* 3.63*  --  3.71* 3.70*  --   --  3.88*  CALCIUM 8.1* 8.1*  --  8.2* 8.0*  --   --  8.3*   < > = values in this interval not displayed.   Liver Function Tests: Recent Labs  Lab 06/08/22 1125 06/09/22 0237  AST 15 12*  ALT 8 7  ALKPHOS 53 60  BILITOT 0.8 0.8  PROT 6.5 6.6  ALBUMIN 2.1* 2.1*  Discharge time spent:  35 minutes.  Signed: Cordelia Poche, MD [facility] 06/09/2022

## 2022-06-09 NOTE — Progress Notes (Addendum)
[facility] NEPHROLOGY PROGRESS NOTE  Assessment/ Plan:  # AKI on CKD4: likely in the setting of volume overload; baseline Cr ~2.5, up to 3.8 in setting of diuresis. I am suspecting that he has had progression of CKD over time given staghorn calculi, PCKD, HTN, age.  Follows with Dr. Candiss Norse as outpatient.  Patient was diuresed initially and then switched to oral Lasix now.  Kidney ultrasound with a lot of cyst and staghorn calculi without any acute finding.  The serum creatinine level seems to be stable and euvolemic on exam.  No sign or symptoms of uremia.  No need for dialysis. He will continue to follow with Dr. Candiss Norse as outpatient.  # Acute on chronic hyponatremia: Initially treated with diuretics.  It seems like he has chronically low sodium level.  120-130s.  Received a dose of tolvaptan on 12/31.  Sodium level improved 128.  Recommend to continue salt tablet, oral Lasix.   # Hypokalemia: Potassium level improved after repletion.               # Anemia:  Hb 8. 12/18 iron sat 31%; received ESA treatment.  Monitor hemoglobin.     # Metabolic acidosis: Continue oral sodium bicarbonate.  Nothing further to add.  Serum sodium level at baseline and creatinine level seems to be stable.  Discussed with the patient and his daughter at the bedside.  Stable to discharge home with family.  Recommend to follow-up with Dr. Candiss Norse at [facility] office in next 2 to 3 weeks.  Discussed with the primary team.  Subjective: Seen and examined at bedside.  Denies nausea, vomiting, chest pain, shortness of breath.  His daughter and son-in-law at bedside.  Urine output recorded around 1.6 L. Objective Vital signs in last 24 hours: Vitals:   06/09/22 0309 06/09/22 0500 06/09/22 0810 06/09/22 1200  BP: (!) 150/75  (!) 161/86 119/86  Pulse: 68  64 71  Resp: '20  15 20  '$ Temp: 98.1 F (36.7 C)  97.6 F (36.4 C) 97.9 F (36.6 C)  TempSrc: Oral  Oral Oral  SpO2: 94%  95% 96%  Weight:  66.7 kg     Height:       Weight change:   Intake/Output Summary (Last 24 hours) at 06/09/2022 1500 Last data filed at 06/09/2022 0539 Gross per 24 hour  Intake 300 ml  Output 800 ml  Net -500 ml       Labs: RENAL PANEL Recent Labs    08/19/21 1600 10/22/21 1331 10/22/21 1341 10/24/21 0947 10/25/21 0251 01/06/22 0000 02/17/22 1622 05/31/22 1146 06/01/22 0514 06/01/22 1831 06/02/22 0223 06/02/22 1456 06/03/22 1058 06/04/22 0115 06/05/22 0335 06/06/22 0034 06/06/22 1239 06/07/22 0225 06/08/22 0303 06/08/22 1125 06/08/22 1903 06/09/22 0237  NA 128* 134*   < > 128*   < > 129*   < > 128* 127* 128* 126* 127* 127* 128* 129* 127* 126* 127* 126* 127* 126* 128*  K 4.9 3.5   < > 3.8   < >  --    < > 3.0* 4.2 3.9 3.5 3.2* 3.5 3.5 3.5 3.4*  --  4.0 3.5  --   --  4.2  CL 94* 102   < > 96*   < > 93*   < > 89* 90* 92* 88* 90* 88* 88* 91* 90*  --  90* 90*  --   --  91*  CO2 24 25   < > 25   < >  --    < >  $'26 26 25 26 27 31 27 29 25  't$ --  27 24  --   --  25  GLUCOSE 79 87   < > 133*   < >  --    < > 91 117* 114* 113* 124* 96 92 95 112*  --  96 94  --   --  103*  BUN 20 18   < > 18   < >  --    < > '16 16 17 17 17 19 21 22 '$ 25*  --  25* 28*  --   --  27*  CREATININE 2.54* 2.84*   < > 2.54*   < > 2.3*   < > 3.32* 3.21* 3.04* 3.17* 2.97* 3.30* 3.49* 3.73* 3.63*  --  3.71* 3.70*  --   --  3.88*  CALCIUM 8.6 8.0*   < > 8.1*   < >  --    < > 8.1* 8.1* 8.0* 8.3* 7.1* 7.9* 8.0* 8.1* 8.1*  --  8.2* 8.0*  --   --  8.3*  MG  --   --   --  1.9  --   --   --   --  1.7  --   --   --   --   --   --   --   --   --   --   --   --   --   PHOS  --   --   --   --   --   --   --   --  2.4*  --   --   --   --   --   --   --   --   --   --   --   --   --   ALBUMIN 3.0* 1.9*  --   --   --  3.5  --  2.4* 2.0*  --  2.5*  --   --   --   --   --   --   --   --  2.1*  --  2.1*   < > = values in this interval not displayed.     Liver Function Tests: Recent Labs  Lab 06/08/22 1125 06/09/22 0237  AST 15 12*  ALT 8 7   ALKPHOS 53 60  BILITOT 0.8 0.8  PROT 6.5 6.6  ALBUMIN 2.1* 2.1*   No results for input(s): "LIPASE", "AMYLASE" in the last 168 hours. No results for input(s): "AMMONIA" in the last 168 hours. CBC: Recent Labs    11/21/21 0948 12/25/21 1004 12/28/21 0329 01/06/22 0000 05/31/22 1146 06/05/22 0335 06/06/22 0034 06/07/22 0225 06/08/22 0303 06/09/22 0237  HGB 9.7* 9.4*   < > 11.1*   < > 8.0* 8.0* 8.4* 7.8* 8.1*  MCV 83.5 86.1   < >  --    < > 85.5 85.8 85.1 84.5 85.5  FERRITIN  --   --   --  124  --   --   --   --   --   --   IRON 32* 35*  --  79  --   --   --   --   --   --    < > = values in this interval not displayed.    Cardiac Enzymes: No results for input(s): "CKTOTAL", "CKMB", "CKMBINDEX", "TROPONINI" in the last 168 hours. CBG: No results for input(s): "GLUCAP" in the last 168 hours.  Iron Studies: No results for input(s): "IRON", "TIBC", "TRANSFERRIN", "FERRITIN" in the last 72 hours. Studies/Results: No results found.  Medications: Infusions:   Scheduled Medications:  darbepoetin (ARANESP) injection - NON-DIALYSIS  60 mcg Subcutaneous Q Thu-1800   feeding supplement (GLUCERNA SHAKE)  237 mL Oral TID BM   ferrous sulfate  325 mg Oral Q breakfast   furosemide  40 mg Oral Daily   heparin  5,000 Units Subcutaneous Q8H   isosorbide-hydrALAZINE  1 tablet Oral BID   latanoprost  1 drop Both Eyes QHS   levothyroxine  50 mcg Oral Q0600   metoprolol succinate  12.5 mg Oral Daily   pantoprazole  40 mg Oral Daily   sodium bicarbonate  1,300 mg Oral BID   sodium chloride  1 g Oral TID WC   tamsulosin  0.4 mg Oral Daily   timolol  1 drop Both Eyes Daily    have reviewed scheduled and prn medications.  Physical Exam: General:NAD, comfortable Heart:RRR, s1s2 nl Lungs:clear b/l, no crackle Abdomen:soft, Non-tender, non-distended Extremities:No edema Neurology: Alert, awake and following commands.  Rick [name] 06/09/2022,3:00 PM  LOS: 9 days

## 2022-06-09 NOTE — Plan of Care (Signed)

## 2022-06-11 ENCOUNTER — Telehealth: Payer: Self-pay | Admitting: *Deleted

## 2022-06-11 NOTE — Patient Outreach (Signed)
  Care Coordination [facility] Note Transition Care Management Follow-up Telephone Call Date of discharge and from where: [phone] [facility] How have you been since you were released from the hospital? Doing pretty good. He is taking all his medications and he is taking a nap now (per wife) Any questions or concerns? Yes He really likes sausage for breakfast can he have that. RN discussed sodium intake with processed food and monitor that. RN discussed how to prepare foods such as fresh vegetables and frozen vs canned.  RN discussed baking, broiling, grilling vs fried. Items Reviewed: Did the pt receive and understand the discharge instructions provided? Yes  Medications obtained and verified? Yes  Other? No  Any new allergies since your discharge? Yes  Dietary orders reviewed? Yes Cardiac low sodium diet Do you have support at home? Yes   Home Care and Equipment/Supplies: Were home health services ordered? no If so, what is the name of the agency? N   Has the agency set up a time to come to the patient's home? not applicable Were any new equipment or medical supplies ordered?  No What is the name of the medical supply agency? N  Were you able to get the supplies/equipment? not applicable Do you have any questions related to the use of the equipment or supplies? No  Functional Questionnaire: (I = Independent and D = Dependent) ADLs: I  Bathing/Dressing- I  Meal Prep- D  Eating- I  Maintaining continence- I  Transferring/Ambulation- I  Managing Meds- D  Follow up appointments reviewed:  PCP Hospital f/u appt confirmed? Yes  Dr [name] 06/19/2022 12:30 Specialist Hospital f/u appt confirmed? Yes  Dorris Carnes [phone] 1:20. Are transportation arrangements needed? No  If their condition worsens, is the pt aware to call PCP or go to the Emergency Dept.? Yes Was the patient provided with contact information for the PCP's office or ED? Yes Was to pt encouraged to call back with questions or  concerns? Yes  SDOH assessments and interventions completed:   Yes SDOH Interventions Today    Flowsheet Row Most Recent Value  SDOH Interventions   Food Insecurity Interventions Intervention Not Indicated  Housing Interventions Intervention Not Indicated  Transportation Interventions Intervention Not Indicated       Care Coordination Interventions:  RN discussed food preparation according to patient diet and monitoring sodium intake.     Encounter Outcome:  Pt. Visit Completed    [name] [phone]

## 2022-06-13 ENCOUNTER — Encounter (HOSPITAL_COMMUNITY)
Admission: RE | Admit: 2022-06-13 | Discharge: 2022-06-13 | Disposition: A | Discharge status: Home / Self Care | Payer: Medicare PPO | Source: Ambulatory Visit | Attending: Nephrology | Admitting: Nephrology

## 2022-06-13 VITALS — HR 67 | Temp 97.3°F | Resp 17

## 2022-06-13 DIAGNOSIS — I340 Nonrheumatic mitral (valve) insufficiency: Secondary | ICD-10-CM | POA: Diagnosis not present

## 2022-06-13 DIAGNOSIS — N184 Chronic kidney disease, stage 4 (severe): Secondary | ICD-10-CM | POA: Diagnosis not present

## 2022-06-13 DIAGNOSIS — D631 Anemia in chronic kidney disease: Secondary | ICD-10-CM | POA: Insufficient documentation

## 2022-06-13 LAB — POCT HEMOGLOBIN-HEMACUE: Hemoglobin: 8.6 g/dL — ABNORMAL LOW (ref 13.0–17.0)

## 2022-06-13 MED ORDER — EPOETIN ALFA-EPBX 10000 UNIT/ML IJ SOLN
INTRAMUSCULAR | Status: AC
  Filled 2022-06-13: qty 1

## 2022-06-13 MED ORDER — EPOETIN ALFA-EPBX 10000 UNIT/ML IJ SOLN
10000.0000 [IU] | INTRAMUSCULAR | Status: DC
  Administered 2022-06-13: 10000 [IU] via SUBCUTANEOUS

## 2022-06-18 ENCOUNTER — Telehealth: Payer: Self-pay | Admitting: Family Medicine

## 2022-06-18 NOTE — Telephone Encounter (Signed)
Missy a nurse from [facility] called in stated a fax was sent to PCP regarding a review of pt medications list . # please advise [phone] ext [phone]

## 2022-06-18 NOTE — Telephone Encounter (Signed)
Returned call to [employer]; lmtcb

## 2022-06-19 ENCOUNTER — Other Ambulatory Visit: Payer: Self-pay | Admitting: Family Medicine

## 2022-06-19 ENCOUNTER — Telehealth: Payer: Self-pay

## 2022-06-19 ENCOUNTER — Encounter: Payer: Self-pay | Admitting: Family Medicine

## 2022-06-19 ENCOUNTER — Ambulatory Visit: Payer: Medicare PPO | Admitting: Family Medicine

## 2022-06-19 ENCOUNTER — Other Ambulatory Visit: Payer: Medicare PPO

## 2022-06-19 VITALS — BP 132/70 | HR 81 | Temp 97.1°F | Ht 67.0 in | Wt 148.0 lb

## 2022-06-19 DIAGNOSIS — I10 Essential (primary) hypertension: Secondary | ICD-10-CM

## 2022-06-19 DIAGNOSIS — I5033 Acute on chronic diastolic (congestive) heart failure: Secondary | ICD-10-CM

## 2022-06-19 DIAGNOSIS — Z7189 Other specified counseling: Secondary | ICD-10-CM

## 2022-06-19 DIAGNOSIS — N179 Acute kidney failure, unspecified: Secondary | ICD-10-CM

## 2022-06-19 DIAGNOSIS — N189 Chronic kidney disease, unspecified: Secondary | ICD-10-CM

## 2022-06-19 LAB — BASIC METABOLIC PANEL
BUN: 42 mg/dL — ABNORMAL HIGH (ref 6–23)
BUN: 42 — AB (ref 4–21)
CO2: 24 mEq/L (ref 19–32)
CO2: 24 — AB (ref 13–22)
Calcium: 8.1 mg/dL — ABNORMAL LOW (ref 8.4–10.5)
Chloride: 96 mEq/L (ref 96–112)
Chloride: 96 — AB (ref 99–108)
Creatinine, Ser: 3.48 mg/dL — ABNORMAL HIGH (ref 0.40–1.50)
Creatinine: 3.5 — AB (ref 0.6–1.3)
GFR: 14.76 mL/min — CL (ref >60.00)
Glucose, Bld: 114 mg/dL — ABNORMAL HIGH (ref 70–99)
Glucose: 114
Potassium: 3.5 mEq/L (ref 3.5–5.1)
Potassium: 3.5 mEq/L (ref 3.5–5.1)
Sodium: 123 — AB (ref 137–147)
Sodium: 129 mEq/L — ABNORMAL LOW (ref 135–145)

## 2022-06-19 LAB — CBC WITH DIFFERENTIAL/PLATELET
Basophils Absolute: 0.1 10*3/uL (ref 0.0–0.1)
Basophils Relative: 0.7 % (ref 0.0–3.0)
Eosinophils Absolute: 0.1 10*3/uL (ref 0.0–0.7)
Eosinophils Relative: 1.6 % (ref 0.0–5.0)
HCT: 25.9 % — ABNORMAL LOW (ref 39.0–52.0)
Hemoglobin: 8.7 g/dL — ABNORMAL LOW (ref 13.0–17.0)
Lymphocytes Relative: 4.5 % — ABNORMAL LOW (ref 12.0–46.0)
Lymphs Abs: 0.4 10*3/uL — ABNORMAL LOW (ref 0.7–4.0)
MCHC: 33.8 g/dL (ref 30.0–36.0)
MCV: 86.9 fl (ref 78.0–100.0)
Monocytes Absolute: 0.7 10*3/uL (ref 0.1–1.0)
Monocytes Relative: 8.9 % (ref 3.0–12.0)
Neutro Abs: 6.7 10*3/uL (ref 1.4–7.7)
Neutrophils Relative %: 84.3 % — ABNORMAL HIGH (ref 43.0–77.0)
Platelets: 412 10*3/uL — ABNORMAL HIGH (ref 150.0–400.0)
RBC: 2.97 Mil/uL — ABNORMAL LOW (ref 4.22–5.81)
RDW: 18.4 % — ABNORMAL HIGH (ref 11.5–15.5)
WBC: 7.9 10*3/uL (ref 4.0–10.5)

## 2022-06-19 LAB — COMPREHENSIVE METABOLIC PANEL
Calcium: 8.1 — AB (ref 8.7–10.7)
eGFR: 14.76

## 2022-06-19 NOTE — Telephone Encounter (Signed)
Hope with [facility] lab called critical lab GFR low at 14.75. sending note to Dr [name], [name] pool and will speak with [facility] CMA. In lab book for ccritical labs also.

## 2022-06-19 NOTE — Telephone Encounter (Signed)
Patient was seen in office today and medication were discussed.

## 2022-06-19 NOTE — Telephone Encounter (Signed)
This is similar to prev and I'll await the rest of the panel.  Thanks.

## 2022-06-19 NOTE — Patient Instructions (Signed)
Here is what I put in your chart:  Goals of care d/w pt.  He would want aggressive treatment in the event of progressive decline to see if he had a chance of recovery but wouldn't want prolonged intervention.   Talk to your other docs about this.  Update me as needed.  Go to the lab on the way out.   If you have mychart we'll likely use that to update you.    Take care.  Glad to see you.

## 2022-06-19 NOTE — Progress Notes (Signed)
Admit date:     05/31/2022  Discharge date: 06/09/22  Discharge Physician: Cordelia Poche, MD    PCP: Tonia Ghent, MD    Recommendations at discharge:  Hospital follow-up with PCP Repeat BMP/CBC in 3-5 days Cardiology follow-up Nephrology follow-up Recommend continued goals of care discussions   Discharge Diagnoses: Principal Problem:   Hypokalemia Active Problems:   AKI (acute kidney injury) ([place]) superimposed on CKD stage IV   Stage 4 chronic kidney disease ([place])   Essential hypertension   GERD without esophagitis   Anemia, iron deficiency   Severe mitral regurgitation   Hyponatremia   Hypoalbuminemia due to protein-calorie malnutrition ([place])   Acquired hypothyroidism   BPH (benign prostatic hyperplasia)   Acute combined systolic and diastolic heart failure ([place])   DNR (do not resuscitate) discussion   Resolved Problems:   * No resolved hospital problems. *   Hospital Course: Rick [name] is a 89 y.o. male with a history of CKD stage IV secondary to polycystic kidney disease, hypertension, hyperlipidemia, hypothyroidism, GERD, prostate cancer s/p prostatectomy. Patient presented secondary to evidence of AKI on outpatient metabolic panel. Patient with evidence of fluid overload and newly diagnosed systolic heart failure. Patient diuresed successfully with worsening AKI. Nephrology on board. AKI and sodium stabilized. Patient will require lasix and salt tablets for management of hyponatremia. Imdur and Toprol Xl started for heart failure.   Assessment and Plan:   AKI on CKD stage IV Baseline creatinine is about 2.5. Creatinine of 3.32 on admission. Secondary to volume overload from acute heart failure. Initial improvement with diuresis and now with worsening creatinine. Renal ultrasound significant for numerous cysts and large left staghorn calculus stable from 12/2021. Creatinine stable. -Nephrology recommendations: Lasix   Hyponatremia Low in 127-128 mostly, but  trended down to 126. Patient managed with fluid restriction, salt tablets, Lasix and tolvaptan. Nephrology recommending discharge on Lasix and salt tablets.   Hypokalemia Mild. Potassium down to 3.2. Likely secondary to diuresis. Resolved with repletion.   Acute systolic heart failure New diagnosis. LVEF of 30-35% with associated global hypokinesis. Cardiology consulted. Patient diuresed with IV Lasix and is now euvolemic. Lasix dosing as mentioned above. Cardiology recommending Imdur and Toprol XL. Outpatient follow-up.   Mitral regurgitation Noted on Transthoracic Echocardiogram.   Aortic regurgitation Noted on Transthoracic Echocardiogram.  ======================== Inpatient course discussed with patient.  He is off Lipitor in the meantime.  He is taking Lasix 40 mg/day per discharge report.  He is still in his baseline eyedrops.  Med list reviewed and updated.  He was admitted with acute on chronic kidney disease with a new diagnosis of systolic heart failure.  We talked about cardiorenal considerations and what heart failure means in his setting, i.e. decreased cardiac efficiency per heartbeat.  Discussed trying to manage his fluid status to prevent overt heart failure symptoms while managing his chronic kidney disease.  Goals of care d/w pt. we talked about the fact that he is not a good candidate for dialysis or transplant.  That leaves medical treatment of his chronic kidney disease.  We talked about his current situation, his desire regarding CPR, etc.  Daughter and wife were present for the conversation.  He would want aggressive treatment in the event of progressive decline to see if he had a chance of recovery but wouldn't want prolonged intervention.   He is not having chest pain or swelling.  Meds, vitals, and allergies reviewed.   ROS: Per HPI unless specifically indicated in ROS section  GEN: nad, alert and oriented HEENT: ncat NECK: supple w/o LA CV: rrr.   PULM:  ctab, no inc wob ABD: soft, +bs EXT: no edema SKIN: Well-perfuse  40 minutes were devoted to patient care in this encounter (this includes time spent reviewing the patient's file/history, interviewing and examining the patient, counseling/reviewing plan with patient).

## 2022-06-22 NOTE — Assessment & Plan Note (Signed)
Avoid NSAIDs.  No change in medication today.  See notes on labs.  Continue Lasix and isosorbide hydralazine as is for now, in combination with metoprolol.  Continue sodium chloride tablets.

## 2022-06-22 NOTE — Assessment & Plan Note (Signed)
See notes on labs.  Continue Lasix and isosorbide hydralazine as is for now, in combination with metoprolol.  Continue sodium chloride tablets.

## 2022-06-22 NOTE — Assessment & Plan Note (Signed)
Goals of care d/w pt.  He would want aggressive treatment in the event of progressive decline to see if he had a chance of recovery but wouldn't want prolonged intervention.

## 2022-06-23 DIAGNOSIS — R609 Edema, unspecified: Secondary | ICD-10-CM | POA: Diagnosis not present

## 2022-06-23 DIAGNOSIS — E871 Hypo-osmolality and hyponatremia: Secondary | ICD-10-CM | POA: Diagnosis not present

## 2022-06-23 DIAGNOSIS — I5020 Unspecified systolic (congestive) heart failure: Secondary | ICD-10-CM | POA: Diagnosis not present

## 2022-06-23 DIAGNOSIS — N184 Chronic kidney disease, stage 4 (severe): Secondary | ICD-10-CM | POA: Diagnosis not present

## 2022-06-23 DIAGNOSIS — Q613 Polycystic kidney, unspecified: Secondary | ICD-10-CM | POA: Diagnosis not present

## 2022-06-23 DIAGNOSIS — I130 Hypertensive heart and chronic kidney disease with heart failure and stage 1 through stage 4 chronic kidney disease, or unspecified chronic kidney disease: Secondary | ICD-10-CM | POA: Diagnosis not present

## 2022-06-23 DIAGNOSIS — N2581 Secondary hyperparathyroidism of renal origin: Secondary | ICD-10-CM | POA: Diagnosis not present

## 2022-06-23 DIAGNOSIS — D631 Anemia in chronic kidney disease: Secondary | ICD-10-CM | POA: Diagnosis not present

## 2022-06-24 LAB — LAB REPORT - SCANNED: EGFR: 15

## 2022-06-25 ENCOUNTER — Ambulatory Visit: Payer: Medicare PPO | Admitting: Internal Medicine

## 2022-06-27 ENCOUNTER — Encounter: Payer: Medicare PPO | Admitting: Internal Medicine

## 2022-06-27 ENCOUNTER — Encounter: Payer: Self-pay | Admitting: Internal Medicine

## 2022-06-27 ENCOUNTER — Encounter (HOSPITAL_COMMUNITY)
Admission: RE | Admit: 2022-06-27 | Discharge: 2022-06-27 | Disposition: A | Discharge status: Home / Self Care | Payer: Medicare PPO | Source: Ambulatory Visit | Attending: Nephrology | Admitting: Nephrology

## 2022-06-27 VITALS — BP 150/86 | HR 73 | Temp 97.1°F | Resp 17

## 2022-06-27 VITALS — BP 134/76 | HR 72 | Ht 67.0 in | Wt 155.4 lb

## 2022-06-27 DIAGNOSIS — I340 Nonrheumatic mitral (valve) insufficiency: Secondary | ICD-10-CM | POA: Diagnosis not present

## 2022-06-27 DIAGNOSIS — D631 Anemia in chronic kidney disease: Secondary | ICD-10-CM

## 2022-06-27 DIAGNOSIS — N184 Chronic kidney disease, stage 4 (severe): Secondary | ICD-10-CM | POA: Diagnosis not present

## 2022-06-27 LAB — POCT HEMOGLOBIN-HEMACUE: Hemoglobin: 8.9 g/dL — ABNORMAL LOW (ref 13.0–17.0)

## 2022-06-27 MED ORDER — EPOETIN ALFA-EPBX 10000 UNIT/ML IJ SOLN
10000.0000 [IU] | INTRAMUSCULAR | Status: DC
  Administered 2022-06-27: 10000 [IU] via SUBCUTANEOUS

## 2022-06-27 MED ORDER — EPOETIN ALFA-EPBX 10000 UNIT/ML IJ SOLN
INTRAMUSCULAR | Status: AC
  Filled 2022-06-27: qty 1

## 2022-06-27 NOTE — Patient Instructions (Signed)
Medication Instructions:   *If you need a refill on your cardiac medications before your next appointment, please call your pharmacy*   Lab Work:  If you have labs (blood work) drawn today and your tests are completely normal, you will receive your results only by: [place] (if you have MyChart) OR A paper copy in the mail If you have any lab test that is abnormal or we need to change your treatment, we will call you to review the results.   Testing/Procedures:    Follow-Up: At [facility], you and your health needs are our priority.  As part of our continuing mission to provide you with exceptional heart care, we have created designated Provider Care Teams.  These Care Teams include your primary Cardiologist (physician) and Advanced Practice Providers (APPs -  Physician Assistants and Nurse Practitioners) who all work together to provide you with the care you need, when you need it.  We recommend signing up for the patient portal called "MyChart".  Sign up information is provided on this After Visit Summary.  MyChart is used to connect with patients for Virtual Visits (Telemedicine).  Patients are able to view lab/test results, encounter notes, upcoming appointments, etc.  Non-urgent messages can be sent to your provider as well.   To learn more about what you can do with MyChart, go to [email or web address].    Your next appointment:   5 month(s) JUNE   Provider:   Dorris Carnes, MD     Other Instructions

## 2022-06-27 NOTE — Progress Notes (Signed)
Cardiology Office Note   Date:  06/27/2022   ID:  Rick [name], DOB 10/29/1928, MRN [id number]  PCP:  Tonia Ghent, MD  Cardiologist:   Dorris Carnes, MD   Pt presents for f/u of HTN and diastolic CHF      History of Present Illness: Rick [name] is a 89 y.o. male with a history of HTN, CKD.   In 2022 the pt was hospitalized for  severe HTN and pulmonary edema  Echo was doe showing LVEF normal  and severe  eccentric MR   He was diuresed with IV lasix     No LE edema prior to d/c   Given age, renal dysfunction the recomm was to treat medically, not pursue any further imaging  I saw the pt in Sept 2023   He was doing good at the time     He was admitted to [facility] on 05/31/22 with volume overload and renal insufficiney   Echo during admission showed LVEF now depressed at 30 to 35%   He wa diuresed Renal service followed   Patient managed with Na tablets, fluid restriction and tovaptan.  Sent home on Lasix and salt tablets. Cardiology also saw patinet   Plan for medical Rx given age an renal insufficiency Patient sent home on7 G sodium per day     Current Meds  Medication Sig   acetaminophen (TYLENOL) 325 MG tablet Take 2 tablets (650 mg total) by mouth every 6 (six) hours as needed.   ferrous sulfate 325 (65 FE) MG tablet Take 1 tablet (325 mg total) by mouth daily with breakfast.   furosemide (LASIX) 40 MG tablet Take 1 tablet (40 mg total) by mouth daily.   isosorbide-hydrALAZINE (BIDIL) 20-37.5 MG tablet Take 1 tablet by mouth 2 (two) times daily.   latanoprost (XALATAN) 0.005 % ophthalmic solution Place 1 drop into both eyes at bedtime.   levothyroxine (SYNTHROID) 50 MCG tablet Take 1 tablet (50 mcg total) by mouth daily. Take on empty stomach early in the morning.   metoprolol succinate (TOPROL-XL) 25 MG 24 hr tablet Take 0.5 tablets (12.5 mg total) by mouth daily.   omeprazole (PRILOSEC OTC) 20 MG tablet Take 1 tablet (20 mg total) by mouth daily.   sodium bicarbonate 650  MG tablet Take 2 tablets (1,300 mg total) by mouth 2 (two) times daily.   sodium chloride 1 g tablet Take 1 tablet (1 g total) by mouth 3 (three) times daily with meals.   tamsulosin (FLOMAX) 0.4 MG CAPS capsule Take 0.4 mg by mouth daily.   timolol (BETIMOL) 0.5 % ophthalmic solution Place 1 drop into both eyes 2 (two) times daily.     Allergies:   Lisinopril and Nsaids   Past Medical History:  Diagnosis Date   Arthritis    GERD (gastroesophageal reflux disease)    History of colon polyps    History of prostate cancer    Hyperlipidemia    Hypertension    Kidney cysts    bilateral.  Renal US   Mitral regurgitation 12/02/2020    Past Surgical History:  Procedure Laterality Date   BIOPSY  05/05/2021   Procedure: BIOPSY;  Surgeon: Thornton Park, MD;  Location: [facility];  Service: Gastroenterology;;   COLONOSCOPY     CYSTOSCOPY  02/12/99   biopsy   ESOPHAGEAL BANDING N/A 08/04/2012   Procedure: ESOPHAGEAL BANDING;  Surgeon: Inda Castle, MD;  Location: [facility];  Service: Endoscopy;  Laterality: N/A;   ESOPHAGOGASTRODUODENOSCOPY  N/A 08/04/2012   Procedure: ESOPHAGOGASTRODUODENOSCOPY (EGD);  Surgeon: Inda Castle, MD;  Location: [facility];  Service: Endoscopy;  Laterality: N/A;   ESOPHAGOGASTRODUODENOSCOPY (EGD) WITH PROPOFOL N/A 05/05/2021   Procedure: ESOPHAGOGASTRODUODENOSCOPY (EGD) WITH PROPOFOL;  Surgeon: Thornton Park, MD;  Location: [facility];  Service: Gastroenterology;  Laterality: N/A;   INGUINAL HERNIA REPAIR  02/1999   Dr. Reece Agar   POLYPECTOMY  05/05/2021   Procedure: POLYPECTOMY;  Surgeon: Thornton Park, MD;  Location: [facility]COPY;  Service: Gastroenterology;;   PROSTATECTOMY  02/1999     Social History:  The patient  reports that he quit smoking about 20 years ago. His smoking use included cigarettes. He has quit using smokeless tobacco.  His smokeless tobacco use included chew. He reports current alcohol use. He reports that he does  not use drugs.   Family History:  The patient's family history includes Bone cancer in his sister; Colon cancer in his sister and another family member; Lung cancer in his sister; Stomach cancer in his brother and brother; Stroke in his mother.    ROS:  Please see the history of present illness. All other systems are reviewed and  Negative to the above problem except as noted.    PHYSICAL EXAM: VS:  BP 134/76   Pulse 72   Ht '5\' 7"'$  (1.702 m)   Wt 155 lb 6.4 oz (70.5 kg)   SpO2 92%   BMI 24.34 kg/m   O2 sat with walking decreased to 83%   Got back up to 90% at rest  He denies SOB with walking  GEN: Thin 89 yo  in no acute distress  HEENT: normal  Neck: no JVD, Cardiac: RRR; Gr II/VI systolc murmur LSB.  1+ LE edema   wearing TED hose Respiratory:  clear to auscultation bilaterally, GI: soft, nontender, nondistended, + BS  No hepatomegaly  MS: no deformity Moving all extremities   Skin: warm and dry, no rash Neuro:  Strength and sensation are intact Psych: euthymic mood, full affect   EKG:  EKG is not ordered today.    Echo 12/01/20  1. Moderate to severe mitral regurgitation is present, likely severe. The jet is eccentric and anteriorly directed. There is likely pulmonary vein flow reversal in systole in the right inferior pulmonary vein. There is splay artifact present, indicating at least moderate severity. The jet appears to wrap around the LA, indicative of severe MR. RVSP elevated. There is no obvious flail segment, but suspect this is not well seen. Would recommend a TEE for characterization of the MR. The mitral valve is grossly normal. Moderate to severe mitral valve regurgitation. No evidence of mitral stenosis. 2. Left ventricular ejection fraction, by estimation, is 60 to 65%. The left ventricle has normal function. The left ventricle has no regional wall motion abnormalities. There is severe asymmetric left ventricular hypertrophy of the basal-septal segment.  Indeterminate diastolic filling due to E-A fusion. 3. Right ventricular systolic function is normal. The right ventricular size is normal. There is mildly elevated pulmonary artery systolic pressure. The estimated right ventricular systolic pressure is 41.9 mmHg. 4. The aortic valve is tricuspid. Aortic valve regurgitation is mild to moderate. Mild aortic valve sclerosis is present, with no evidence of aortic valve stenosis. 5. The inferior vena cava is normal in size with greater than 50% respiratory variability, suggesting right atrial pressure of 3 mmHg. 6. There is a large cystic structure located inferior to the liver, which could represent a cyst. Would recommend dedicated liver imaging.  Lipid Panel    Component Value Date/Time   CHOL 91 06/02/2022 0223   CHOL 101 08/19/2021 1600   TRIG 68 06/02/2022 0223   HDL 40 (L) 06/02/2022 0223   HDL 52 08/19/2021 1600   CHOLHDL 2.3 06/02/2022 0223   VLDL 14 06/02/2022 0223   LDLCALC 37 06/02/2022 0223   LDLCALC 38 08/19/2021 1600      Wt Readings from Last 3 Encounters:  06/27/22 155 lb 6.4 oz (70.5 kg)  06/19/22 148 lb (67.1 kg)  06/09/22 147 lb 0.8 oz (66.7 kg)      ASSESSMENT AND PLAN:   1 HFrEF  Recent admit for CHF   LVEF now severely depressed   I have reviewed echo with patient and family   Has regional wall motion changes, probable CAD  Given his age, medical problems recomm medical Rx.    He is also followed in renal clinic  He was sent home on salt tablets given Hyponatremia.    Wil lreview records from renal service.   Some increased volume on exam but he is comfortable     Will get BMET and BNP     Not a candidate for intervention   2  Mitral regurgitation.   I have reviewed echo images with patient and wife  MR is very eccentric, directed anteriorly    Probably mod t osevere    He is fairly comfortable  Denies SOB         3  Hyponatremia    Get labs   4   Renal   Stage 4 CKD   Will get BMET   Tentative follow  up in June since he is also followed in renal clnic    Again  will review their records  Current medicines are reviewed at length with the patient today.  The patient does not have concerns regarding medicines.  Signed, Dorris Carnes, MD  06/27/2022 10:12 AM    [address], [place], [place]  [address] Phone: [phone]; Fax: [phone]

## 2022-07-10 ENCOUNTER — Other Ambulatory Visit: Payer: Self-pay | Admitting: *Deleted

## 2022-07-10 DIAGNOSIS — N184 Chronic kidney disease, stage 4 (severe): Secondary | ICD-10-CM

## 2022-07-11 ENCOUNTER — Encounter (HOSPITAL_COMMUNITY)
Admission: RE | Admit: 2022-07-11 | Discharge: 2022-07-11 | Disposition: A | Discharge status: Home / Self Care | Payer: Medicare PPO | Source: Ambulatory Visit | Attending: Nephrology | Admitting: Nephrology

## 2022-07-11 VITALS — BP 153/91 | HR 77 | Temp 97.0°F | Resp 17

## 2022-07-11 DIAGNOSIS — N184 Chronic kidney disease, stage 4 (severe): Secondary | ICD-10-CM | POA: Insufficient documentation

## 2022-07-11 DIAGNOSIS — D631 Anemia in chronic kidney disease: Secondary | ICD-10-CM | POA: Insufficient documentation

## 2022-07-11 LAB — IRON AND TIBC
Iron: 21 ug/dL — ABNORMAL LOW (ref 45–182)
Saturation Ratios: 13 % — ABNORMAL LOW (ref 17.9–39.5)
TIBC: 158 ug/dL — ABNORMAL LOW (ref 250–450)
UIBC: 137 ug/dL

## 2022-07-11 LAB — FERRITIN: Ferritin: 56 ng/mL (ref 24–336)

## 2022-07-11 LAB — POCT HEMOGLOBIN-HEMACUE: Hemoglobin: 8.2 g/dL — ABNORMAL LOW (ref 13.0–17.0)

## 2022-07-11 MED ORDER — EPOETIN ALFA-EPBX 10000 UNIT/ML IJ SOLN: 20000.0000 [IU] | INTRAMUSCULAR | Status: DC

## 2022-07-11 MED ORDER — EPOETIN ALFA-EPBX 10000 UNIT/ML IJ SOLN
INTRAMUSCULAR | Status: AC
  Administered 2022-07-11: 20000 [IU] via SUBCUTANEOUS
  Filled 2022-07-11: qty 2

## 2022-07-16 ENCOUNTER — Encounter: Payer: Self-pay | Admitting: Vascular Surgery

## 2022-07-16 ENCOUNTER — Ambulatory Visit (INDEPENDENT_AMBULATORY_CARE_PROVIDER_SITE_OTHER)
Admission: RE | Admit: 2022-07-16 | Discharge: 2022-07-16 | Disposition: A | Discharge status: Home / Self Care | Payer: Medicare PPO | Source: Ambulatory Visit | Attending: Vascular Surgery | Admitting: Vascular Surgery

## 2022-07-16 ENCOUNTER — Ambulatory Visit: Payer: Medicare PPO | Admitting: Vascular Surgery

## 2022-07-16 ENCOUNTER — Ambulatory Visit (HOSPITAL_COMMUNITY)
Admission: RE | Admit: 2022-07-16 | Discharge: 2022-07-16 | Disposition: A | Discharge status: Home / Self Care | Payer: Medicare PPO | Source: Ambulatory Visit | Attending: Vascular Surgery | Admitting: Vascular Surgery

## 2022-07-16 VITALS — BP 152/90 | HR 73 | Resp 24 | Ht 67.0 in | Wt 155.0 lb

## 2022-07-16 DIAGNOSIS — N184 Chronic kidney disease, stage 4 (severe): Secondary | ICD-10-CM | POA: Insufficient documentation

## 2022-07-16 DIAGNOSIS — N185 Chronic kidney disease, stage 5: Secondary | ICD-10-CM | POA: Diagnosis not present

## 2022-07-16 NOTE — Progress Notes (Signed)
Patient ID: Rick [name], male   DOB: 1928/06/15, 89 y.o.   MRN: [id number]  Reason for Consult: New Patient (Initial Visit)   Referred by Gean Quint, MD  Subjective:     HPI:  Rick [name] is a 89 y.o. male history of chronic kidney disease.  He has never been on dialysis.  He is right-hand dominant.  He denies any upper extremity or chest or breast surgery or pacemaker placement in the past.  He has struggled with bilateral upper and lower extremity swelling as well as shortness of breath in the recent past for which he was recently hospitalized.  He is here today for evaluation for possible fistula placement and to wait on AV graft.  Past Medical History:  Diagnosis Date   Arthritis    GERD (gastroesophageal reflux disease)    History of colon polyps    History of prostate cancer    Hyperlipidemia    Hypertension    Kidney cysts    bilateral.  Renal US   Mitral regurgitation 12/02/2020   Family History  Problem Relation Age of Onset   Bone cancer Sister    Stomach cancer Brother    Stomach cancer Brother    Lung cancer Sister    Colon cancer Sister    Stroke Mother    Colon cancer Other        nephew   Past Surgical History:  Procedure Laterality Date   BIOPSY  05/05/2021   Procedure: BIOPSY;  Surgeon: Thornton Park, MD;  Location: [place];  Service: Gastroenterology;;   COLONOSCOPY     CYSTOSCOPY  02/12/99   biopsy   ESOPHAGEAL BANDING N/A 08/04/2012   Procedure: ESOPHAGEAL BANDING;  Surgeon: Inda Castle, MD;  Location: [facility];  Service: Endoscopy;  Laterality: N/A;   ESOPHAGOGASTRODUODENOSCOPY N/A 08/04/2012   Procedure: ESOPHAGOGASTRODUODENOSCOPY (EGD);  Surgeon: Inda Castle, MD;  Location: [facility];  Service: Endoscopy;  Laterality: N/A;   ESOPHAGOGASTRODUODENOSCOPY (EGD) WITH PROPOFOL N/A 05/05/2021   Procedure: ESOPHAGOGASTRODUODENOSCOPY (EGD) WITH PROPOFOL;  Surgeon: Thornton Park, MD;  Location: [facility];  Service:  Gastroenterology;  Laterality: N/A;   INGUINAL HERNIA REPAIR  02/1999   Dr. Reece Agar   POLYPECTOMY  05/05/2021   Procedure: POLYPECTOMY;  Surgeon: Thornton Park, MD;  Location: [facility];  Service: Gastroenterology;;   PROSTATECTOMY  02/1999    Short Social History:  Social History   Tobacco Use   Smoking status: Former    Types: Cigarettes    Quit date: 08/26/2001    Years since quitting: 20.9   Smokeless tobacco: Former    Types: Chew   Tobacco comments:    quit about 10 years or more  Substance Use Topics   Alcohol use: Yes    Allergies  Allergen Reactions   Lisinopril     cough   Nsaids     Elevated Cr    Current Outpatient Medications  Medication Sig Dispense Refill   acetaminophen (TYLENOL) 325 MG tablet Take 2 tablets (650 mg total) by mouth every 6 (six) hours as needed.     ferrous sulfate 325 (65 FE) MG tablet Take 1 tablet (325 mg total) by mouth daily with breakfast.     furosemide (LASIX) 40 MG tablet Take 1 tablet (40 mg total) by mouth daily. 30 tablet 2   isosorbide-hydrALAZINE (BIDIL) 20-37.5 MG tablet Take 1 tablet by mouth 2 (two) times daily. 60 tablet 2   latanoprost (XALATAN) 0.005 % ophthalmic solution Place 1  drop into both eyes at bedtime.     levothyroxine (SYNTHROID) 50 MCG tablet Take 1 tablet (50 mcg total) by mouth daily. Take on empty stomach early in the morning. 90 tablet 3   metoprolol succinate (TOPROL-XL) 25 MG 24 hr tablet Take 0.5 tablets (12.5 mg total) by mouth daily. 15 tablet 2   omeprazole (PRILOSEC OTC) 20 MG tablet Take 1 tablet (20 mg total) by mouth daily.     sodium bicarbonate 650 MG tablet Take 2 tablets (1,300 mg total) by mouth 2 (two) times daily.     tamsulosin (FLOMAX) 0.4 MG CAPS capsule Take 0.4 mg by mouth daily.     timolol (BETIMOL) 0.5 % ophthalmic solution Place 1 drop into both eyes 2 (two) times daily.     No current facility-administered medications for this visit.    Review of Systems   Constitutional:  Constitutional negative. HENT: HENT negative.  Eyes: Eyes negative.  Respiratory: Positive for shortness of breath.  Cardiovascular: Positive for leg swelling.  GI: Gastrointestinal negative.  Musculoskeletal: Musculoskeletal negative.  Skin: Skin negative.  Neurological: Neurological negative. Hematologic: Hematologic/lymphatic negative.  Psychiatric: Psychiatric negative.        Objective:  Objective   Vitals:   07/16/22 1026  BP: (!) 152/90  Pulse: 73  Resp: (!) 24  SpO2: (!) 88%  Weight: 155 lb (70.3 kg)  Height: '5\' 7"'$  (1.702 m)   Body mass index is 24.28 kg/m.  Physical Exam HENT:     Head: Normocephalic.     Nose: Nose normal.  Eyes:     Pupils: Pupils are equal, round, and reactive to light.  Cardiovascular:     Pulses: Normal pulses.  Abdominal:     General: Abdomen is flat.  Musculoskeletal:     Right lower leg: Edema present.     Left lower leg: Edema present.  Skin:    General: Skin is warm.     Capillary Refill: Capillary refill takes less than 2 seconds.  Neurological:     Mental Status: He is alert.  Psychiatric:        Mood and Affect: Mood normal.        Thought Content: Thought content normal.        Judgment: Judgment normal.     Data:      Assessment/Plan:    89 year old male with chronic kidney disease stage V with consideration of dialysis access.  I discussed with him the options being catheter, fistula, graft.  Patient does not have any upper extremity surface veins either on ultrasound today or by physical exam.  Will schedule for graft when closer and will Dr. Keturah Barre office reach out when graft is necessary.     Waynetta Sandy MD [facility]

## 2022-07-23 ENCOUNTER — Other Ambulatory Visit: Payer: Self-pay

## 2022-07-23 ENCOUNTER — Inpatient Hospital Stay (HOSPITAL_COMMUNITY)
Admission: EM | Admit: 2022-07-23 | Discharge: 2022-08-04 | DRG: 291 | Disposition: A | Discharge status: Home Health | Payer: Medicare PPO | Attending: Internal Medicine | Admitting: Internal Medicine

## 2022-07-23 ENCOUNTER — Emergency Department (HOSPITAL_COMMUNITY): Payer: Medicare PPO

## 2022-07-23 DIAGNOSIS — I5043 Acute on chronic combined systolic (congestive) and diastolic (congestive) heart failure: Secondary | ICD-10-CM | POA: Diagnosis present

## 2022-07-23 DIAGNOSIS — J9811 Atelectasis: Secondary | ICD-10-CM | POA: Diagnosis present

## 2022-07-23 DIAGNOSIS — M25559 Pain in unspecified hip: Secondary | ICD-10-CM | POA: Diagnosis not present

## 2022-07-23 DIAGNOSIS — E8770 Fluid overload, unspecified: Secondary | ICD-10-CM | POA: Diagnosis present

## 2022-07-23 DIAGNOSIS — I130 Hypertensive heart and chronic kidney disease with heart failure and stage 1 through stage 4 chronic kidney disease, or unspecified chronic kidney disease: Principal | ICD-10-CM | POA: Diagnosis present

## 2022-07-23 DIAGNOSIS — Z800 Family history of malignant neoplasm of digestive organs: Secondary | ICD-10-CM

## 2022-07-23 DIAGNOSIS — E8779 Other fluid overload: Secondary | ICD-10-CM

## 2022-07-23 DIAGNOSIS — R0602 Shortness of breath: Secondary | ICD-10-CM | POA: Diagnosis not present

## 2022-07-23 DIAGNOSIS — N3000 Acute cystitis without hematuria: Secondary | ICD-10-CM

## 2022-07-23 DIAGNOSIS — I3139 Other pericardial effusion (noninflammatory): Secondary | ICD-10-CM | POA: Diagnosis present

## 2022-07-23 DIAGNOSIS — Z886 Allergy status to analgesic agent status: Secondary | ICD-10-CM

## 2022-07-23 DIAGNOSIS — E8809 Other disorders of plasma-protein metabolism, not elsewhere classified: Secondary | ICD-10-CM | POA: Diagnosis present

## 2022-07-23 DIAGNOSIS — H409 Unspecified glaucoma: Secondary | ICD-10-CM | POA: Diagnosis present

## 2022-07-23 DIAGNOSIS — I50810 Right heart failure, unspecified: Secondary | ICD-10-CM

## 2022-07-23 DIAGNOSIS — Z801 Family history of malignant neoplasm of trachea, bronchus and lung: Secondary | ICD-10-CM

## 2022-07-23 DIAGNOSIS — K219 Gastro-esophageal reflux disease without esophagitis: Secondary | ICD-10-CM | POA: Diagnosis present

## 2022-07-23 DIAGNOSIS — J90 Pleural effusion, not elsewhere classified: Secondary | ICD-10-CM | POA: Diagnosis not present

## 2022-07-23 DIAGNOSIS — I452 Bifascicular block: Secondary | ICD-10-CM | POA: Diagnosis present

## 2022-07-23 DIAGNOSIS — I080 Rheumatic disorders of both mitral and aortic valves: Secondary | ICD-10-CM | POA: Diagnosis present

## 2022-07-23 DIAGNOSIS — J9601 Acute respiratory failure with hypoxia: Secondary | ICD-10-CM | POA: Diagnosis not present

## 2022-07-23 DIAGNOSIS — G9341 Metabolic encephalopathy: Secondary | ICD-10-CM | POA: Diagnosis not present

## 2022-07-23 DIAGNOSIS — N2581 Secondary hyperparathyroidism of renal origin: Secondary | ICD-10-CM | POA: Diagnosis not present

## 2022-07-23 DIAGNOSIS — F05 Delirium due to known physiological condition: Secondary | ICD-10-CM | POA: Diagnosis not present

## 2022-07-23 DIAGNOSIS — N184 Chronic kidney disease, stage 4 (severe): Secondary | ICD-10-CM | POA: Diagnosis present

## 2022-07-23 DIAGNOSIS — J810 Acute pulmonary edema: Secondary | ICD-10-CM | POA: Diagnosis not present

## 2022-07-23 DIAGNOSIS — E039 Hypothyroidism, unspecified: Secondary | ICD-10-CM | POA: Diagnosis present

## 2022-07-23 DIAGNOSIS — E871 Hypo-osmolality and hyponatremia: Secondary | ICD-10-CM | POA: Diagnosis present

## 2022-07-23 DIAGNOSIS — Z8546 Personal history of malignant neoplasm of prostate: Secondary | ICD-10-CM

## 2022-07-23 DIAGNOSIS — I5020 Unspecified systolic (congestive) heart failure: Secondary | ICD-10-CM | POA: Diagnosis not present

## 2022-07-23 DIAGNOSIS — Z79899 Other long term (current) drug therapy: Secondary | ICD-10-CM

## 2022-07-23 DIAGNOSIS — Z87891 Personal history of nicotine dependence: Secondary | ICD-10-CM

## 2022-07-23 DIAGNOSIS — I509 Heart failure, unspecified: Secondary | ICD-10-CM | POA: Diagnosis not present

## 2022-07-23 DIAGNOSIS — Z823 Family history of stroke: Secondary | ICD-10-CM

## 2022-07-23 DIAGNOSIS — Z86010 Personal history of colonic polyps: Secondary | ICD-10-CM

## 2022-07-23 DIAGNOSIS — E785 Hyperlipidemia, unspecified: Secondary | ICD-10-CM | POA: Diagnosis present

## 2022-07-23 DIAGNOSIS — E870 Hyperosmolality and hypernatremia: Secondary | ICD-10-CM | POA: Diagnosis not present

## 2022-07-23 DIAGNOSIS — I10 Essential (primary) hypertension: Secondary | ICD-10-CM | POA: Diagnosis not present

## 2022-07-23 DIAGNOSIS — D631 Anemia in chronic kidney disease: Secondary | ICD-10-CM | POA: Diagnosis present

## 2022-07-23 DIAGNOSIS — N189 Chronic kidney disease, unspecified: Secondary | ICD-10-CM | POA: Diagnosis present

## 2022-07-23 DIAGNOSIS — D509 Iron deficiency anemia, unspecified: Secondary | ICD-10-CM | POA: Diagnosis present

## 2022-07-23 DIAGNOSIS — N179 Acute kidney failure, unspecified: Secondary | ICD-10-CM | POA: Diagnosis present

## 2022-07-23 DIAGNOSIS — I440 Atrioventricular block, first degree: Secondary | ICD-10-CM | POA: Diagnosis present

## 2022-07-23 DIAGNOSIS — I110 Hypertensive heart disease with heart failure: Secondary | ICD-10-CM | POA: Diagnosis not present

## 2022-07-23 DIAGNOSIS — Z79890 Hormone replacement therapy (postmenopausal): Secondary | ICD-10-CM

## 2022-07-23 DIAGNOSIS — Q613 Polycystic kidney, unspecified: Secondary | ICD-10-CM | POA: Diagnosis not present

## 2022-07-23 DIAGNOSIS — D649 Anemia, unspecified: Secondary | ICD-10-CM | POA: Diagnosis not present

## 2022-07-23 DIAGNOSIS — Z888 Allergy status to other drugs, medicaments and biological substances status: Secondary | ICD-10-CM

## 2022-07-23 DIAGNOSIS — I501 Left ventricular failure: Secondary | ICD-10-CM | POA: Diagnosis not present

## 2022-07-23 LAB — CBC WITH DIFFERENTIAL/PLATELET
Abs Immature Granulocytes: 0.02 10*3/uL (ref 0.00–0.07)
Basophils Absolute: 0 10*3/uL (ref 0.0–0.1)
Basophils Relative: 0 %
Eosinophils Absolute: 0 10*3/uL (ref 0.0–0.5)
Eosinophils Relative: 0 %
HCT: 27 % — ABNORMAL LOW (ref 39.0–52.0)
Hemoglobin: 8.8 g/dL — ABNORMAL LOW (ref 13.0–17.0)
Immature Granulocytes: 0 %
Lymphocytes Relative: 7 %
Lymphs Abs: 0.7 10*3/uL (ref 0.7–4.0)
MCH: 28.5 pg (ref 26.0–34.0)
MCHC: 32.6 g/dL (ref 30.0–36.0)
MCV: 87.4 fL (ref 80.0–100.0)
Monocytes Absolute: 0.5 10*3/uL (ref 0.1–1.0)
Monocytes Relative: 5 %
Neutro Abs: 8.4 10*3/uL — ABNORMAL HIGH (ref 1.7–7.7)
Neutrophils Relative %: 88 %
Platelets: 282 10*3/uL (ref 150–400)
RBC: 3.09 MIL/uL — ABNORMAL LOW (ref 4.22–5.81)
RDW: 17.1 % — ABNORMAL HIGH (ref 11.5–15.5)
WBC: 9.6 10*3/uL (ref 4.0–10.5)
nRBC: 0 % (ref 0.0–0.2)

## 2022-07-23 LAB — URINALYSIS, ROUTINE W REFLEX MICROSCOPIC
Bilirubin Urine: NEGATIVE
Glucose, UA: NEGATIVE mg/dL
Ketones, ur: 5 mg/dL — AB
Nitrite: NEGATIVE
Protein, ur: >300 mg/dL — AB
Specific Gravity, Urine: 1.013 (ref 1.005–1.030)
pH: 6 (ref 5.0–8.0)

## 2022-07-23 LAB — COMPREHENSIVE METABOLIC PANEL
ALT: 9 U/L (ref 0–44)
AST: 20 U/L (ref 15–41)
Albumin: 1.7 g/dL — ABNORMAL LOW (ref 3.5–5.0)
Alkaline Phosphatase: 97 U/L (ref 38–126)
Anion gap: 12 (ref 5–15)
BUN: 35 mg/dL — ABNORMAL HIGH (ref 8–23)
CO2: 26 mmol/L (ref 22–32)
Calcium: 7.8 mg/dL — ABNORMAL LOW (ref 8.9–10.3)
Chloride: 90 mmol/L — ABNORMAL LOW (ref 98–111)
Creatinine, Ser: 4.46 mg/dL — ABNORMAL HIGH (ref 0.61–1.24)
GFR, Estimated: 12 mL/min — ABNORMAL LOW (ref >60)
Glucose, Bld: 93 mg/dL (ref 70–99)
Potassium: 3.8 mmol/L (ref 3.5–5.1)
Sodium: 128 mmol/L — ABNORMAL LOW (ref 135–145)
Total Bilirubin: 0.9 mg/dL (ref 0.3–1.2)
Total Protein: 7 g/dL (ref 6.5–8.1)

## 2022-07-23 LAB — I-STAT CHEM 8, ED
BUN: 34 mg/dL — ABNORMAL HIGH (ref 8–23)
Calcium, Ion: 0.98 mmol/L — ABNORMAL LOW (ref 1.15–1.40)
Chloride: 92 mmol/L — ABNORMAL LOW (ref 98–111)
Creatinine, Ser: 4.8 mg/dL — ABNORMAL HIGH (ref 0.61–1.24)
Glucose, Bld: 82 mg/dL (ref 70–99)
HCT: 29 % — ABNORMAL LOW (ref 39.0–52.0)
Hemoglobin: 9.9 g/dL — ABNORMAL LOW (ref 13.0–17.0)
Potassium: 3.7 mmol/L (ref 3.5–5.1)
Sodium: 129 mmol/L — ABNORMAL LOW (ref 135–145)
TCO2: 25 mmol/L (ref 22–32)

## 2022-07-23 LAB — URINALYSIS, MICROSCOPIC (REFLEX): WBC, UA: >50 WBC/hpf (ref 0–5)

## 2022-07-23 LAB — BRAIN NATRIURETIC PEPTIDE: B Natriuretic Peptide: 1433.9 pg/mL — ABNORMAL HIGH (ref 0.0–100.0)

## 2022-07-23 LAB — TROPONIN I (HIGH SENSITIVITY): Troponin I (High Sensitivity): 16 ng/L (ref <18)

## 2022-07-23 MED ORDER — ONDANSETRON HCL 4 MG/2ML IJ SOLN: 4.0000 mg | Freq: Four times a day (QID) | INTRAMUSCULAR | Status: DC | PRN

## 2022-07-23 MED ORDER — ONDANSETRON HCL 4 MG PO TABS: 4.0000 mg | ORAL_TABLET | Freq: Four times a day (QID) | ORAL | Status: DC | PRN

## 2022-07-23 MED ORDER — HEPARIN SODIUM (PORCINE) 5000 UNIT/ML IJ SOLN
5000.0000 [IU] | Freq: Three times a day (TID) | INTRAMUSCULAR | Status: DC
  Administered 2022-07-24 – 2022-08-04 (×35): 5000 [IU] via SUBCUTANEOUS
  Filled 2022-07-23 (×34): qty 1

## 2022-07-23 MED ORDER — FUROSEMIDE 10 MG/ML IJ SOLN
120.0000 mg | Freq: Once | INTRAVENOUS | Status: AC
  Administered 2022-07-24: 120 mg via INTRAVENOUS
  Filled 2022-07-23: qty 10

## 2022-07-23 MED ORDER — FUROSEMIDE 10 MG/ML IJ SOLN
40.0000 mg | Freq: Once | INTRAMUSCULAR | Status: AC
  Administered 2022-07-23: 40 mg via INTRAVENOUS
  Filled 2022-07-23: qty 4

## 2022-07-23 MED ORDER — ACETAMINOPHEN 325 MG PO TABS
650.0000 mg | ORAL_TABLET | Freq: Four times a day (QID) | ORAL | Status: DC | PRN
  Administered 2022-07-27: 650 mg via ORAL
  Filled 2022-07-23: qty 2

## 2022-07-23 MED ORDER — ACETAMINOPHEN 650 MG RE SUPP: 650.0000 mg | Freq: Four times a day (QID) | RECTAL | Status: DC | PRN

## 2022-07-23 MED ORDER — SODIUM CHLORIDE 0.9 % IV SOLN
1.0000 g | Freq: Once | INTRAVENOUS | Status: AC
  Administered 2022-07-23: 1 g via INTRAVENOUS
  Filled 2022-07-23: qty 10

## 2022-07-23 NOTE — ED Provider Triage Note (Signed)
Emergency Medicine Provider Triage Evaluation Note  TERRIO TURIN , a 89 y.o. male  was evaluated in triage.  Pt complains of shortness of breath.  Began approximately 2 days ago.  Has a history of CHF.  Does not know what medications he takes.  His shortness of breath is worse when lying flat on his back or on his side.  Reports a cough that began yesterday.  Denies chest pain or fevers.  Review of Systems  Positive: As above Negative: As above  Physical Exam  BP 132/82   Pulse 67   Temp 97.6 F (36.4 C)   Resp (!) 24   SpO2 97%  Gen:   Awake, no distress   Resp:  Normal effort  MSK:   Moves extremities without difficulty  Other:  3+ pitting edema bilateral lower extremities to the level of the knee.  Pitting edema in bilateral hands as well.  Abdomen is tense and distended  Medical Decision Making  Medically screening exam initiated at 11:34 AM.  Appropriate orders placed.  BRET KLOCKO was informed that the remainder of the evaluation will be completed by another provider, this initial triage assessment does not replace that evaluation, and the importance of remaining in the ED until their evaluation is complete.     Roylene Reason, [place] 07/23/22 1135

## 2022-07-23 NOTE — H&P (Signed)
History and Physical    Patient: Rick [name] [id number] DOB: 1928-08-18 DOA: 07/23/2022 DOS: the patient was seen and examined on 07/23/2022 PCP: Tonia Ghent, MD  Patient coming from: {Point_of_Origin:[address]}  Chief Complaint:  Chief Complaint  Patient presents with   Shortness of Breath   HPI: Rick [name] is a 89 y.o. male with medical history significant of ***  Review of Systems: {ROS_Text:26778} Past Medical History:  Diagnosis Date   Arthritis    GERD (gastroesophageal reflux disease)    History of colon polyps    History of prostate cancer    Hyperlipidemia    Hypertension    Kidney cysts    bilateral.  Renal US   Mitral regurgitation 12/02/2020   Past Surgical History:  Procedure Laterality Date   BIOPSY  05/05/2021   Procedure: BIOPSY;  Surgeon: Thornton Park, MD;  Location: [facility];  Service: Gastroenterology;;   COLONOSCOPY     CYSTOSCOPY  02/12/99   biopsy   ESOPHAGEAL BANDING N/A 08/04/2012   Procedure: ESOPHAGEAL BANDING;  Surgeon: Inda Castle, MD;  Location: [facility];  Service: Endoscopy;  Laterality: N/A;   ESOPHAGOGASTRODUODENOSCOPY N/A 08/04/2012   Procedure: ESOPHAGOGASTRODUODENOSCOPY (EGD);  Surgeon: Inda Castle, MD;  Location: [facility];  Service: Endoscopy;  Laterality: N/A;   ESOPHAGOGASTRODUODENOSCOPY (EGD) WITH PROPOFOL N/A 05/05/2021   Procedure: ESOPHAGOGASTRODUODENOSCOPY (EGD) WITH PROPOFOL;  Surgeon: Thornton Park, MD;  Location: [facility];  Service: Gastroenterology;  Laterality: N/A;   INGUINAL HERNIA REPAIR  02/1999   Dr. Reece Agar   POLYPECTOMY  05/05/2021   Procedure: POLYPECTOMY;  Surgeon: Thornton Park, MD;  Location: [facility] ENDOSCOPY;  Service: Gastroenterology;;   PROSTATECTOMY  02/1999   Social History:  reports that he quit smoking about 20 years ago. His smoking use included cigarettes. He has quit using smokeless tobacco.  His smokeless tobacco use included chew. He reports current alcohol  use. He reports that he does not use drugs.  Allergies  Allergen Reactions   Lisinopril     cough   Nsaids     Elevated Cr    Family History  Problem Relation Age of Onset   Bone cancer Sister    Stomach cancer Brother    Stomach cancer Brother    Lung cancer Sister    Colon cancer Sister    Stroke Mother    Colon cancer Other        nephew    Prior to Admission medications   Medication Sig Start Date End Date Taking? Authorizing Provider  acetaminophen (TYLENOL) 325 MG tablet Take 2 tablets (650 mg total) by mouth every 6 (six) hours as needed. 11/01/21   Jill Alexanders, PA-C  ferrous sulfate 325 (65 FE) MG tablet Take 1 tablet (325 mg total) by mouth daily with breakfast. 11/21/21 11/21/22  Tonia Ghent, MD  furosemide (LASIX) 40 MG tablet Take 1 tablet (40 mg total) by mouth daily. 06/10/22 09/08/22  Mariel Aloe, MD  isosorbide-hydrALAZINE (BIDIL) 20-37.5 MG tablet Take 1 tablet by mouth 2 (two) times daily. 06/09/22 09/07/22  Mariel Aloe, MD  latanoprost (XALATAN) 0.005 % ophthalmic solution Place 1 drop into both eyes at bedtime.    [provider]  levothyroxine (SYNTHROID) 50 MCG tablet Take 1 tablet (50 mcg total) by mouth daily. Take on empty stomach early in the morning. 12/29/21   Tonia Ghent, MD  metoprolol succinate (TOPROL-XL) 25 MG 24 hr tablet Take 0.5 tablets (12.5 mg total) by mouth daily.  06/10/22 09/08/22  Mariel Aloe, MD  omeprazole (PRILOSEC OTC) 20 MG tablet Take 1 tablet (20 mg total) by mouth daily. 12/07/20   Tonia Ghent, MD  sodium bicarbonate 650 MG tablet Take 2 tablets (1,300 mg total) by mouth 2 (two) times daily. 11/01/21   Jill Alexanders, PA-C  tamsulosin (FLOMAX) 0.4 MG CAPS capsule Take 0.4 mg by mouth daily. 05/30/22   [provider]  timolol (BETIMOL) 0.5 % ophthalmic solution Place 1 drop into both eyes 2 (two) times daily.    [provider]    Physical Exam: Vitals:   07/23/22 2230 07/23/22  2245 07/23/22 2300 07/23/22 2315  BP: (!) 147/81 (!) 140/86 (!) 146/86 (!) 151/77  Pulse: 62 (!) 50 (!) 58 (!) 57  Resp: (!) 21 16 17 16  $ Temp:      SpO2: 96% 96% 96% 97%  Weight:      Height:       *** Data Reviewed: {Tip this will not be part of the note when signed- Document your independent interpretation of telemetry tracing, EKG, lab, Radiology test or any other diagnostic tests. Add any new diagnostic test ordered today. (Optional):26781} {Results:26384}  Assessment and Plan: No notes have been filed under this hospital service. Service: Hospitalist     Advance Care Planning:   Code Status: Full Code ***  Consults: ***  Family Communication: ***  Severity of Illness: {Observation/Inpatient:21159}  Author: Etta Quill., DO 07/23/2022 11:30 PM  For on call review [email or web address].

## 2022-07-23 NOTE — ED Triage Notes (Signed)
Patient complains of exertional shortness of breath that started two days ago and resolves with rest. Denies chest pain, denies nausea, is alert, oriented, speaking in complete sentences, and is in no apparent distress.

## 2022-07-23 NOTE — ED Provider Notes (Signed)
[facility] Provider Note   CSN: [id number] Arrival date & time: 07/23/22  1039     History  Chief Complaint  Patient presents with   Shortness of Breath    Rick [name] is a 89 y.o. male.  Patient here with shortness of breath, weight gain, swelling in his legs.  History of CHF and CKD.  Not on dialysis.  He feels like he is retaining fluid again.  He is got very low energy and feels short of breath when he is lying on his back and with exertion.  Denies any fever or cough or sputum production.  Recently admitted for the same.  Denies any nausea vomiting or abdominal pain or weakness or numbness.  The history is provided by the patient.       Home Medications Prior to Admission medications   Medication Sig Start Date End Date Taking? Authorizing Provider  acetaminophen (TYLENOL) 325 MG tablet Take 2 tablets (650 mg total) by mouth every 6 (six) hours as needed. 11/01/21   Jill Alexanders, PA-C  ferrous sulfate 325 (65 FE) MG tablet Take 1 tablet (325 mg total) by mouth daily with breakfast. 11/21/21 11/21/22  Tonia Ghent, MD  furosemide (LASIX) 40 MG tablet Take 1 tablet (40 mg total) by mouth daily. 06/10/22 09/08/22  Mariel Aloe, MD  isosorbide-hydrALAZINE (BIDIL) 20-37.5 MG tablet Take 1 tablet by mouth 2 (two) times daily. 06/09/22 09/07/22  Mariel Aloe, MD  latanoprost (XALATAN) 0.005 % ophthalmic solution Place 1 drop into both eyes at bedtime.    [provider]  levothyroxine (SYNTHROID) 50 MCG tablet Take 1 tablet (50 mcg total) by mouth daily. Take on empty stomach early in the morning. 12/29/21   Tonia Ghent, MD  metoprolol succinate (TOPROL-XL) 25 MG 24 hr tablet Take 0.5 tablets (12.5 mg total) by mouth daily. 06/10/22 09/08/22  Mariel Aloe, MD  omeprazole (PRILOSEC OTC) 20 MG tablet Take 1 tablet (20 mg total) by mouth daily. 12/07/20   Tonia Ghent, MD  sodium bicarbonate 650 MG tablet Take 2  tablets (1,300 mg total) by mouth 2 (two) times daily. 11/01/21   Jill Alexanders, PA-C  tamsulosin (FLOMAX) 0.4 MG CAPS capsule Take 0.4 mg by mouth daily. 05/30/22   [provider]  timolol (BETIMOL) 0.5 % ophthalmic solution Place 1 drop into both eyes 2 (two) times daily.    [provider]      Allergies    Lisinopril and Nsaids    Review of Systems   Review of Systems  Physical Exam Updated Vital Signs BP (!) 164/131   Pulse 61   Temp (!) 97.4 F (36.3 C)   Resp 19   Ht 5' 8"$  (1.727 m)   Wt 66.7 kg   SpO2 99%   BMI 22.35 kg/m  Physical Exam Vitals and nursing note reviewed.  Constitutional:      General: He is not in acute distress.    Appearance: He is well-developed.  HENT:     Head: Normocephalic and atraumatic.     Mouth/Throat:     Mouth: Mucous membranes are moist.  Eyes:     Conjunctiva/sclera: Conjunctivae normal.     Pupils: Pupils are equal, round, and reactive to light.  Cardiovascular:     Rate and Rhythm: Normal rate and regular rhythm.     Heart sounds: No murmur heard. Pulmonary:     Effort: Tachypnea present.  No respiratory distress.     Breath sounds: Rhonchi present.     Comments: Slightly increased work of breathing Abdominal:     Palpations: Abdomen is soft.     Tenderness: There is no abdominal tenderness.  Musculoskeletal:        General: No swelling.     Cervical back: Normal range of motion and neck supple.     Right lower leg: Edema present.     Left lower leg: Edema present.     Comments: 3+ pitting edema bilaterally  Skin:    General: Skin is warm and dry.     Capillary Refill: Capillary refill takes less than 2 seconds.  Neurological:     Mental Status: He is alert.  Psychiatric:        Mood and Affect: Mood normal.     ED Results / Procedures / Treatments   Labs (all labs ordered are listed, but only abnormal results are displayed) Labs Reviewed  BRAIN NATRIURETIC PEPTIDE - Abnormal; Notable for  the following components:      Result Value   B Natriuretic Peptide 1,433.9 (*)    All other components within normal limits  COMPREHENSIVE METABOLIC PANEL - Abnormal; Notable for the following components:   Sodium 128 (*)    Chloride 90 (*)    BUN 35 (*)    Creatinine, Ser 4.46 (*)    Calcium 7.8 (*)    Albumin 1.7 (*)    GFR, Estimated 12 (*)    All other components within normal limits  URINALYSIS, ROUTINE W REFLEX MICROSCOPIC - Abnormal; Notable for the following components:   APPearance TURBID (*)    Hgb urine dipstick SMALL (*)    Ketones, ur 5 (*)    Protein, ur >300 (*)    Leukocytes,Ua MODERATE (*)    All other components within normal limits  URINALYSIS, MICROSCOPIC (REFLEX) - Abnormal; Notable for the following components:   Bacteria, UA MANY (*)    All other components within normal limits  I-STAT CHEM 8, ED - Abnormal; Notable for the following components:   Sodium 129 (*)    Chloride 92 (*)    BUN 34 (*)    Creatinine, Ser 4.80 (*)    Calcium, Ion 0.98 (*)    Hemoglobin 9.9 (*)    HCT 29.0 (*)    All other components within normal limits  URINE CULTURE  CBC  CBC WITH DIFFERENTIAL/PLATELET  TROPONIN I (HIGH SENSITIVITY)  TROPONIN I (HIGH SENSITIVITY)    EKG EKG Interpretation  Date/Time:  Wednesday July 23 2022 11:01:56 EST Ventricular Rate:  66 PR Interval:  312 QRS Duration: 158 QT Interval:  470 QTC Calculation: 492 R Axis:   -77 Text Interpretation: Sinus rhythm with 1st degree A-V block Left axis deviation Confirmed by Lennice Sites ([id number]) on 07/23/2022 4:24:56 PM  Radiology DG Chest 2 View  Result Date: 07/23/2022 CLINICAL DATA:  z shortness of breath EXAM: CHEST - 2 VIEW COMPARISON:  Chest x-ray dated May 31, 2022 FINDINGS: Visualized cardiac and mediastinal contours are unchanged. Small bilateral pleural effusions and lower lung predominant heterogeneous opacities. IMPRESSION: 1. Lower lung predominant heterogeneous opacities,  differential considerations include pulmonary edema or infection. 2. Small bilateral pleural effusions. Electronically Signed   By: Yetta Glassman M.D.   On: 07/23/2022 12:08    Procedures Procedures    Medications Ordered in ED Medications  cefTRIAXone (ROCEPHIN) 1 g in sodium chloride 0.9 % 100 mL IVPB (1 g Intravenous New  Bag/Given 07/23/22 2054)  furosemide (LASIX) injection 40 mg (40 mg Intravenous Given 07/23/22 1835)    ED Course/ Medical Decision Making/ A&P                             Medical Decision Making Amount and/or Complexity of Data Reviewed Labs: ordered. Radiology: ordered.  Risk Prescription drug management. Decision regarding hospitalization.   [name] is here with hypertension, high cholesterol, CKD, heart failure who presents to the ED with shortness of breath, leg swelling.  He has normal vitals.  No fever.  He looks grossly volume overloaded on exam.  Differential diagnosis likely volume retention in the setting of heart failure and CKD.  Per chart review it sounds like he is getting some vascular planning for may be dialysis in the future.  He denied having any chest pain.  Differential is likely volume retention in the setting of his chronic illnesses.  I have low suspicion for ACS or PE or infectious process.  Will get CBC, MP, troponin, chest x-ray.  Per my review and interpretation of labs his BNP is 1400, creatinine mildly above baseline of 4.46 but electrolytes overall unremarkable.  Sodium is 128.  Chest x-ray per my review and interpretation consistent with edema.  Will give him a dose IV Lasix anticipate admission however awaiting troponin.  Troponins at baseline.  Nursing staff came to show me his urine and it was very purulent.  Will send for urinalysis which is consistent with a UTI.  Will give a dose IV Rocephin and will send for urine culture.  Will admit for further care.  This chart was dictated using voice recognition software.   Despite best efforts to proofread,  errors can occur which can change the documentation meaning.         Final Clinical Impression(s) / ED Diagnoses Final diagnoses:  Hypervolemia, unspecified hypervolemia type  Right-sided congestive heart failure, unspecified HF chronicity ([place])  Acute cystitis without hematuria    Rx / DC Orders ED Discharge Orders     None         Lennice Sites, DO 07/23/22 2058

## 2022-07-24 DIAGNOSIS — I3139 Other pericardial effusion (noninflammatory): Secondary | ICD-10-CM | POA: Diagnosis present

## 2022-07-24 DIAGNOSIS — E8770 Fluid overload, unspecified: Secondary | ICD-10-CM | POA: Diagnosis present

## 2022-07-24 DIAGNOSIS — E8809 Other disorders of plasma-protein metabolism, not elsewhere classified: Secondary | ICD-10-CM | POA: Diagnosis present

## 2022-07-24 DIAGNOSIS — J9601 Acute respiratory failure with hypoxia: Secondary | ICD-10-CM | POA: Diagnosis not present

## 2022-07-24 DIAGNOSIS — N184 Chronic kidney disease, stage 4 (severe): Secondary | ICD-10-CM | POA: Diagnosis not present

## 2022-07-24 DIAGNOSIS — H409 Unspecified glaucoma: Secondary | ICD-10-CM | POA: Diagnosis present

## 2022-07-24 DIAGNOSIS — I130 Hypertensive heart and chronic kidney disease with heart failure and stage 1 through stage 4 chronic kidney disease, or unspecified chronic kidney disease: Secondary | ICD-10-CM | POA: Diagnosis not present

## 2022-07-24 DIAGNOSIS — E870 Hyperosmolality and hypernatremia: Secondary | ICD-10-CM | POA: Diagnosis not present

## 2022-07-24 DIAGNOSIS — I080 Rheumatic disorders of both mitral and aortic valves: Secondary | ICD-10-CM | POA: Diagnosis present

## 2022-07-24 DIAGNOSIS — E039 Hypothyroidism, unspecified: Secondary | ICD-10-CM | POA: Diagnosis present

## 2022-07-24 DIAGNOSIS — J9811 Atelectasis: Secondary | ICD-10-CM | POA: Diagnosis present

## 2022-07-24 DIAGNOSIS — K219 Gastro-esophageal reflux disease without esophagitis: Secondary | ICD-10-CM | POA: Diagnosis present

## 2022-07-24 DIAGNOSIS — I5043 Acute on chronic combined systolic (congestive) and diastolic (congestive) heart failure: Secondary | ICD-10-CM | POA: Diagnosis present

## 2022-07-24 DIAGNOSIS — E871 Hypo-osmolality and hyponatremia: Secondary | ICD-10-CM | POA: Diagnosis not present

## 2022-07-24 DIAGNOSIS — D649 Anemia, unspecified: Secondary | ICD-10-CM | POA: Diagnosis not present

## 2022-07-24 DIAGNOSIS — E8779 Other fluid overload: Secondary | ICD-10-CM | POA: Diagnosis not present

## 2022-07-24 DIAGNOSIS — I440 Atrioventricular block, first degree: Secondary | ICD-10-CM | POA: Diagnosis present

## 2022-07-24 DIAGNOSIS — D631 Anemia in chronic kidney disease: Secondary | ICD-10-CM | POA: Diagnosis present

## 2022-07-24 DIAGNOSIS — F05 Delirium due to known physiological condition: Secondary | ICD-10-CM | POA: Diagnosis not present

## 2022-07-24 DIAGNOSIS — I452 Bifascicular block: Secondary | ICD-10-CM | POA: Diagnosis present

## 2022-07-24 DIAGNOSIS — I10 Essential (primary) hypertension: Secondary | ICD-10-CM | POA: Diagnosis not present

## 2022-07-24 DIAGNOSIS — M25559 Pain in unspecified hip: Secondary | ICD-10-CM | POA: Diagnosis not present

## 2022-07-24 DIAGNOSIS — R0602 Shortness of breath: Secondary | ICD-10-CM | POA: Diagnosis not present

## 2022-07-24 DIAGNOSIS — I509 Heart failure, unspecified: Secondary | ICD-10-CM | POA: Diagnosis not present

## 2022-07-24 DIAGNOSIS — E785 Hyperlipidemia, unspecified: Secondary | ICD-10-CM | POA: Diagnosis present

## 2022-07-24 DIAGNOSIS — Q613 Polycystic kidney, unspecified: Secondary | ICD-10-CM | POA: Diagnosis not present

## 2022-07-24 DIAGNOSIS — Z79899 Other long term (current) drug therapy: Secondary | ICD-10-CM | POA: Diagnosis not present

## 2022-07-24 DIAGNOSIS — J810 Acute pulmonary edema: Secondary | ICD-10-CM | POA: Diagnosis not present

## 2022-07-24 DIAGNOSIS — I501 Left ventricular failure: Secondary | ICD-10-CM | POA: Diagnosis not present

## 2022-07-24 DIAGNOSIS — G9341 Metabolic encephalopathy: Secondary | ICD-10-CM | POA: Diagnosis not present

## 2022-07-24 DIAGNOSIS — D509 Iron deficiency anemia, unspecified: Secondary | ICD-10-CM | POA: Diagnosis present

## 2022-07-24 DIAGNOSIS — N179 Acute kidney failure, unspecified: Secondary | ICD-10-CM | POA: Diagnosis not present

## 2022-07-24 LAB — BASIC METABOLIC PANEL
Anion gap: 13 (ref 5–15)
BUN: 36 mg/dL — ABNORMAL HIGH (ref 8–23)
CO2: 24 mmol/L (ref 22–32)
Calcium: 7.4 mg/dL — ABNORMAL LOW (ref 8.9–10.3)
Chloride: 88 mmol/L — ABNORMAL LOW (ref 98–111)
Creatinine, Ser: 4.31 mg/dL — ABNORMAL HIGH (ref 0.61–1.24)
GFR, Estimated: 12 mL/min — ABNORMAL LOW (ref >60)
Glucose, Bld: 76 mg/dL (ref 70–99)
Sodium: 125 mmol/L — ABNORMAL LOW (ref 135–145)

## 2022-07-24 LAB — CBC
HCT: 27.8 % — ABNORMAL LOW (ref 39.0–52.0)
Hemoglobin: 8.9 g/dL — ABNORMAL LOW (ref 13.0–17.0)
MCH: 28.3 pg (ref 26.0–34.0)
MCHC: 32 g/dL (ref 30.0–36.0)
MCV: 88.3 fL (ref 80.0–100.0)
Platelets: 261 10*3/uL (ref 150–400)
RBC: 3.15 MIL/uL — ABNORMAL LOW (ref 4.22–5.81)
RDW: 17.3 % — ABNORMAL HIGH (ref 11.5–15.5)
WBC: 8.4 10*3/uL (ref 4.0–10.5)
nRBC: 0 % (ref 0.0–0.2)

## 2022-07-24 LAB — POTASSIUM: Potassium: 3.5 mmol/L (ref 3.5–5.1)

## 2022-07-24 LAB — TROPONIN I (HIGH SENSITIVITY): Troponin I (High Sensitivity): 30 ng/L — ABNORMAL HIGH (ref <18)

## 2022-07-24 MED ORDER — ISOSORB DINITRATE-HYDRALAZINE 20-37.5 MG PO TABS
1.0000 | ORAL_TABLET | Freq: Two times a day (BID) | ORAL | Status: DC
  Administered 2022-07-24 – 2022-08-04 (×22): 1 via ORAL
  Filled 2022-07-24 (×24): qty 1

## 2022-07-24 MED ORDER — LATANOPROST 0.005 % OP SOLN
1.0000 [drp] | Freq: Every day | OPHTHALMIC | Status: DC
  Administered 2022-07-24 – 2022-08-03 (×11): 1 [drp] via OPHTHALMIC
  Filled 2022-07-24: qty 2.5

## 2022-07-24 MED ORDER — METOPROLOL SUCCINATE ER 25 MG PO TB24
12.5000 mg | ORAL_TABLET | Freq: Every day | ORAL | Status: DC
  Administered 2022-07-24 – 2022-08-04 (×12): 12.5 mg via ORAL
  Filled 2022-07-24 (×13): qty 1

## 2022-07-24 MED ORDER — SODIUM BICARBONATE 650 MG PO TABS
1250.0000 mg | ORAL_TABLET | Freq: Two times a day (BID) | ORAL | Status: DC
  Administered 2022-07-24: 1300 mg via ORAL
  Filled 2022-07-24: qty 2

## 2022-07-24 MED ORDER — PANTOPRAZOLE SODIUM 40 MG PO TBEC
40.0000 mg | DELAYED_RELEASE_TABLET | Freq: Every day | ORAL | Status: DC
  Administered 2022-07-24 – 2022-08-04 (×12): 40 mg via ORAL
  Filled 2022-07-24 (×12): qty 1

## 2022-07-24 MED ORDER — DOCUSATE SODIUM 100 MG PO CAPS
200.0000 mg | ORAL_CAPSULE | Freq: Once | ORAL | Status: AC
  Administered 2022-07-24: 200 mg via ORAL
  Filled 2022-07-24: qty 2

## 2022-07-24 MED ORDER — TAMSULOSIN HCL 0.4 MG PO CAPS
0.4000 mg | ORAL_CAPSULE | Freq: Every day | ORAL | Status: DC
  Administered 2022-07-24 – 2022-08-04 (×12): 0.4 mg via ORAL
  Filled 2022-07-24 (×12): qty 1

## 2022-07-24 MED ORDER — FUROSEMIDE 10 MG/ML IJ SOLN
120.0000 mg | Freq: Three times a day (TID) | INTRAVENOUS | Status: DC
  Administered 2022-07-24 – 2022-07-25 (×5): 120 mg via INTRAVENOUS
  Filled 2022-07-24: qty 12
  Filled 2022-07-24: qty 2
  Filled 2022-07-24 (×2): qty 10
  Filled 2022-07-24 (×2): qty 12
  Filled 2022-07-24: qty 2
  Filled 2022-07-24: qty 10

## 2022-07-24 MED ORDER — TIMOLOL MALEATE 0.5 % OP SOLN
1.0000 [drp] | Freq: Two times a day (BID) | OPHTHALMIC | Status: DC
  Administered 2022-07-24 – 2022-08-04 (×22): 1 [drp] via OPHTHALMIC
  Filled 2022-07-24: qty 5

## 2022-07-24 MED ORDER — LEVOTHYROXINE SODIUM 50 MCG PO TABS
50.0000 ug | ORAL_TABLET | Freq: Every day | ORAL | Status: DC
  Administered 2022-07-24 – 2022-08-04 (×12): 50 ug via ORAL
  Filled 2022-07-24 (×2): qty 1
  Filled 2022-07-24 (×2): qty 2
  Filled 2022-07-24 (×6): qty 1
  Filled 2022-07-24: qty 2
  Filled 2022-07-24: qty 1

## 2022-07-24 MED ORDER — FERROUS SULFATE 325 (65 FE) MG PO TABS
325.0000 mg | ORAL_TABLET | Freq: Every day | ORAL | Status: DC
  Administered 2022-07-24 – 2022-08-04 (×12): 325 mg via ORAL
  Filled 2022-07-24 (×13): qty 1

## 2022-07-24 MED ORDER — POLYETHYLENE GLYCOL 3350 17 G PO PACK
17.0000 g | PACK | Freq: Every day | ORAL | Status: DC
  Administered 2022-07-24 – 2022-08-03 (×10): 17 g via ORAL
  Filled 2022-07-24 (×11): qty 1

## 2022-07-24 MED ORDER — IPRATROPIUM-ALBUTEROL 0.5-2.5 (3) MG/3ML IN SOLN
3.0000 mL | Freq: Four times a day (QID) | RESPIRATORY_TRACT | Status: DC | PRN
  Administered 2022-07-24 – 2022-07-26 (×4): 3 mL via RESPIRATORY_TRACT
  Filled 2022-07-24 (×4): qty 3

## 2022-07-24 NOTE — Assessment & Plan Note (Deleted)
Noted. Nephrology has been consulted.

## 2022-07-24 NOTE — Consult Note (Signed)
Reason for Consult: worsening CKD Referring Physician: Karie Kirks DO  Chief Complaint: SOB  Assessment/Plan: CKD: secondary to volume overload 2/2 CHF.  Baseline creatinine approximately 3.5, presents with creatinine normal around 4.5.  Has already shown improvement from 4.8-4.31 with IV Lasix.  Unfortunately he may be progressing to ESRD requiring dialysis which she is amenable to starting if necessary.  Responded well to Lasix 120 mg IV, will continue this. Hyponatremia: Chronic, related to CKD. Appropriately started on sodium tablets by ED provider.  Continue to monitor. Anemia: baseline appears ~8-9, 8.9 on admission. Suspect chronic disease is the cause. No indication for ESA currently.    HPI: Rick [name] is an 89 y.o. male w/ PMHx of CHF in setting of severe MR and CKD who presented with shortness of breath, leg swelling. BNP 1400, Cr 4.46, BUN 35, Na 128, Hgb 8.8.   Chemistry and CBC: Creatinine  Date/Time Value Ref Range Status  06/19/2022 12:00 AM 3.5 (A) 0.6 - 1.3 Final  01/06/2022 12:00 AM 2.3 (A) 0.6 - 1.3 Final  11/04/2021 12:00 AM 2.4 (A) 0.6 - 1.3 Final  07/04/2020 12:00 AM 1.9 (A) 0.6 - 1.3 Final   Creatinine, Ser  Date/Time Value Ref Range Status  07/24/2022 05:50 AM 4.31 (H) 0.61 - 1.24 mg/dL Final  07/23/2022 08:52 PM 4.80 (H) 0.61 - 1.24 mg/dL Final  07/23/2022 11:20 AM 4.46 (H) 0.61 - 1.24 mg/dL Final  06/09/2022 02:37 AM 3.88 (H) 0.61 - 1.24 mg/dL Final  06/08/2022 03:03 AM 3.70 (H) 0.61 - 1.24 mg/dL Final  06/07/2022 02:25 AM 3.71 (H) 0.61 - 1.24 mg/dL Final  06/06/2022 12:34 AM 3.63 (H) 0.61 - 1.24 mg/dL Final  06/05/2022 03:35 AM 3.73 (H) 0.61 - 1.24 mg/dL Final  06/04/2022 01:15 AM 3.49 (H) 0.61 - 1.24 mg/dL Final  06/03/2022 10:58 AM 3.30 (H) 0.61 - 1.24 mg/dL Final  06/02/2022 02:56 PM 2.97 (H) 0.61 - 1.24 mg/dL Final  06/02/2022 02:23 AM 3.17 (H) 0.61 - 1.24 mg/dL Final  06/01/2022 06:31 PM 3.04 (H) 0.61 - 1.24 mg/dL Final  06/01/2022 05:14 AM  3.21 (H) 0.61 - 1.24 mg/dL Final  05/31/2022 11:46 AM 3.32 (H) 0.61 - 1.24 mg/dL Final  02/17/2022 04:22 PM 2.56 (H) 0.76 - 1.27 mg/dL Final  12/31/2021 10:18 AM 2.75 (H) 0.40 - 1.50 mg/dL Final  12/28/2021 03:29 AM 3.04 (H) 0.61 - 1.24 mg/dL Final  12/25/2021 10:04 AM 2.44 (H) 0.40 - 1.50 mg/dL Final  11/21/2021 09:48 AM 2.53 (H) 0.40 - 1.50 mg/dL Final  10/26/2021 03:11 AM 2.26 (H) 0.61 - 1.24 mg/dL Final  10/25/2021 02:51 AM 2.40 (H) 0.61 - 1.24 mg/dL Final  10/24/2021 09:47 AM 2.54 (H) 0.61 - 1.24 mg/dL Final  10/23/2021 03:08 AM 2.57 (H) 0.61 - 1.24 mg/dL Final  10/22/2021 01:41 PM 3.00 (H) 0.61 - 1.24 mg/dL Final  10/22/2021 01:31 PM 2.84 (H) 0.61 - 1.24 mg/dL Final  08/19/2021 04:00 PM 2.54 (H) 0.76 - 1.27 mg/dL Final  05/07/2021 01:06 AM 2.76 (H) 0.61 - 1.24 mg/dL Final  05/06/2021 11:18 AM 2.79 (H) 0.61 - 1.24 mg/dL Final  05/06/2021 03:01 AM 2.79 (H) 0.61 - 1.24 mg/dL Final  05/05/2021 02:42 AM 3.07 (H) 0.61 - 1.24 mg/dL Final  05/04/2021 03:14 AM 3.21 (H) 0.61 - 1.24 mg/dL Final  05/03/2021 03:10 AM 3.23 (H) 0.61 - 1.24 mg/dL Final  05/02/2021 02:53 PM 3.10 (H) 0.61 - 1.24 mg/dL Final  05/02/2021 02:34 AM 3.21 (H) 0.61 - 1.24 mg/dL Final  05/01/2021 08:17  PM 3.26 (H) 0.61 - 1.24 mg/dL Final  12/07/2020 10:52 AM 2.35 (H) 0.40 - 1.50 mg/dL Final  12/02/2020 04:50 AM 3.03 (H) 0.61 - 1.24 mg/dL Final  12/01/2020 06:57 AM 3.12 (H) 0.61 - 1.24 mg/dL Final  11/30/2020 11:07 AM 2.87 (H) 0.61 - 1.24 mg/dL Final  05/28/2020 11:15 AM 2.27 (H) 0.40 - 1.50 mg/dL Final  05/18/2020 12:18 PM 2.21 (H) 0.40 - 1.50 mg/dL Final  11/17/2018 09:58 AM 1.87 (H) 0.40 - 1.50 mg/dL Final  11/11/2018 01:00 PM 2.03 (H) 0.40 - 1.50 mg/dL Final  09/03/2018 08:49 AM 1.70 (H) 0.40 - 1.50 mg/dL Final  05/31/2018 08:04 AM 1.57 (H) 0.40 - 1.50 mg/dL Final  01/26/2018 08:45 AM 1.75 (H) 0.40 - 1.50 mg/dL Final  10/23/2017 08:46 AM 1.66 (H) 0.40 - 1.50 mg/dL Final  04/22/2017 08:13 AM 1.59 (H) 0.40 - 1.50  mg/dL Final  10/15/2016 01:36 PM 1.68 (H) 0.40 - 1.50 mg/dL Final  02/21/2016 02:35 PM 1.38 0.40 - 1.50 mg/dL Final  01/18/2015 09:42 AM 1.59 (H) 0.40 - 1.50 mg/dL Final   Recent Labs  Lab 07/23/22 1120 07/23/22 2052 07/24/22 0550 07/24/22 0645  NA 128* 129* 125*  --   K 3.8 3.7 SENDING RESULTS UNAFFECTED BY HEMOLYSIS PER BETH TEASLEY SINKS RN 07/24/22 0640 M KOROLESKI 3.5  CL 90* 92* 88*  --   CO2 26  --  24  --   GLUCOSE 93 82 76  --   BUN 35* 34* 36*  --   CREATININE 4.46* 4.80* 4.31*  --   CALCIUM 7.8*  --  7.4*  --    Recent Labs  Lab 07/23/22 2050 07/23/22 2052 07/24/22 0550  WBC 9.6  --  8.4  NEUTROABS 8.4*  --   --   HGB 8.8* 9.9* 8.9*  HCT 27.0* 29.0* 27.8*  MCV 87.4  --  88.3  PLT 282  --  261   Liver Function Tests: Recent Labs  Lab 07/23/22 1120  AST 20  ALT 9  ALKPHOS 97  BILITOT 0.9  PROT 7.0  ALBUMIN 1.7*   No results for input(s): "LIPASE", "AMYLASE" in the last 168 hours. No results for input(s): "AMMONIA" in the last 168 hours. Cardiac Enzymes: No results for input(s): "CKTOTAL", "CKMB", "CKMBINDEX", "TROPONINI" in the last 168 hours. Iron Studies: No results for input(s): "IRON", "TIBC", "TRANSFERRIN", "FERRITIN" in the last 72 hours. PT/INR: @LABRCNTIP$ (inr:5)  Xrays/Other Studies: ) Results for orders placed or performed during the hospital encounter of 07/23/22 (from the past 48 hour(s))  Brain natriuretic peptide     Status: Abnormal   Collection Time: 07/23/22 11:20 AM  Result Value Ref Range   B Natriuretic Peptide 1,433.9 (H) 0.0 - 100.0 pg/mL    Comment: Performed at [facility], [address] [place], [place] [address]  Comprehensive metabolic panel     Status: Abnormal   Collection Time: 07/23/22 11:20 AM  Result Value Ref Range   Sodium 128 (L) 135 - 145 mmol/L   Potassium 3.8 3.5 - 5.1 mmol/L   Chloride 90 (L) 98 - 111 mmol/L   CO2 26 22 - 32 mmol/L   Glucose, Bld 93 70 - 99 mg/dL    Comment: Glucose reference  range applies only to samples taken after fasting for at least 8 hours.   BUN 35 (H) 8 - 23 mg/dL   Creatinine, Ser 4.46 (H) 0.61 - 1.24 mg/dL   Calcium 7.8 (L) 8.9 - 10.3 mg/dL   Total  Protein 7.0 6.5 - 8.1 g/dL   Albumin 1.7 (L) 3.5 - 5.0 g/dL   AST 20 15 - 41 U/L   ALT 9 0 - 44 U/L   Alkaline Phosphatase 97 38 - 126 U/L   Total Bilirubin 0.9 0.3 - 1.2 mg/dL   GFR, Estimated 12 (L) >60 mL/min    Comment: (NOTE) Calculated using the CKD-EPI Creatinine Equation (2021)    Anion gap 12 5 - 15    Comment: Performed at [facility] [address], [place], [place] [address]  Troponin I (High Sensitivity)     Status: None   Collection Time: 07/23/22  6:30 PM  Result Value Ref Range   Troponin I (High Sensitivity) 16 <18 ng/L    Comment: (NOTE) Elevated high sensitivity troponin I (hsTnI) values and significant  changes across serial measurements may suggest ACS but many other  chronic and acute conditions are known to elevate hsTnI results.  Refer to the "Links" section for chest pain algorithms and additional  guidance. Performed at [facility], [place] [address] [place], [place] [address]   Urinalysis, Routine w reflex microscopic -Urine, Clean Catch     Status: Abnormal   Collection Time: 07/23/22  6:50 PM  Result Value Ref Range   Color, Urine YELLOW YELLOW   APPearance TURBID (A) CLEAR   Specific Gravity, Urine 1.013 1.005 - 1.030   pH 6.0 5.0 - 8.0   Glucose, UA NEGATIVE NEGATIVE mg/dL   Hgb urine dipstick SMALL (A) NEGATIVE   Bilirubin Urine NEGATIVE NEGATIVE   Ketones, ur 5 (A) NEGATIVE mg/dL   Protein, ur >300 (A) NEGATIVE mg/dL   Nitrite NEGATIVE NEGATIVE   Leukocytes,Ua MODERATE (A) NEGATIVE    Comment: Performed at [facility] [address], [place], [place] [address]  Urinalysis, Microscopic (reflex)     Status: Abnormal   Collection Time: 07/23/22  6:50 PM  Result Value Ref Range   RBC / HPF 0-5 0 - 5 RBC/hpf   WBC, UA >50 0 - 5 WBC/hpf    Bacteria, UA MANY (A) NONE SEEN   Squamous Epithelial / HPF 11-20 0 - 5 /HPF   Hyaline Casts, UA PRESENT     Comment: Performed at [facility], [place] [address], [place], [place] [address]  CBC with Differential     Status: Abnormal   Collection Time: 07/23/22  8:50 PM  Result Value Ref Range   WBC 9.6 4.0 - 10.5 K/uL   RBC 3.09 (L) 4.22 - 5.81 MIL/uL   Hemoglobin 8.8 (L) 13.0 - 17.0 g/dL   HCT 27.0 (L) 39.0 - 52.0 %   MCV 87.4 80.0 - 100.0 fL   MCH 28.5 26.0 - 34.0 pg   MCHC 32.6 30.0 - 36.0 g/dL   RDW 17.1 (H) 11.5 - 15.5 %   Platelets 282 150 - 400 K/uL   nRBC 0.0 0.0 - 0.2 %   Neutrophils Relative % 88 %   Neutro Abs 8.4 (H) 1.7 - 7.7 K/uL   Lymphocytes Relative 7 %   Lymphs Abs 0.7 0.7 - 4.0 K/uL   Monocytes Relative 5 %   Monocytes Absolute 0.5 0.1 - 1.0 K/uL   Eosinophils Relative 0 %   Eosinophils Absolute 0.0 0.0 - 0.5 K/uL   Basophils Relative 0 %   Basophils Absolute 0.0 0.0 - 0.1 K/uL   Immature Granulocytes 0 %   Abs Immature Granulocytes 0.02 0.00 - 0.07 K/uL  Comment: Performed at [facility], [place] [address] [place], [place] [address]  I-stat chem 8, ED (not at [facility], [facility] or [facility])     Status: Abnormal   Collection Time: 07/23/22  8:52 PM  Result Value Ref Range   Sodium 129 (L) 135 - 145 mmol/L   Potassium 3.7 3.5 - 5.1 mmol/L   Chloride 92 (L) 98 - 111 mmol/L   BUN 34 (H) 8 - 23 mg/dL   Creatinine, Ser 4.80 (H) 0.61 - 1.24 mg/dL   Glucose, Bld 82 70 - 99 mg/dL    Comment: Glucose reference range applies only to samples taken after fasting for at least 8 hours.   Calcium, Ion 0.98 (L) 1.15 - 1.40 mmol/L   TCO2 25 22 - 32 mmol/L   Hemoglobin 9.9 (L) 13.0 - 17.0 g/dL   HCT 29.0 (L) 39.0 - [id number] %  Basic metabolic panel     Status: Abnormal   Collection Time: 07/24/22  5:50 AM  Result Value Ref Range   Sodium 125 (L) 135 - 145 mmol/L   Potassium  3.5 - 5.1 mmol/L    SENDING RESULTS UNAFFECTED BY HEMOLYSIS PER BETH TEASLEY SINKS RN 07/24/22 0640  M KOROLESKI   Chloride 88 (L) 98 - 111 mmol/L   CO2 24 22 - 32 mmol/L   Glucose, Bld 76 70 - 99 mg/dL    Comment: Glucose reference range applies only to samples taken after fasting for at least 8 hours.   BUN 36 (H) 8 - 23 mg/dL   Creatinine, Ser 4.31 (H) 0.61 - 1.24 mg/dL   Calcium 7.4 (L) 8.9 - 10.3 mg/dL   GFR, Estimated 12 (L) >60 mL/min    Comment: (NOTE) Calculated using the CKD-EPI Creatinine Equation (2021)    Anion gap 13 5 - 15    Comment: Performed at [facility] [address], [place], [place] [address]  CBC     Status: Abnormal   Collection Time: 07/24/22  5:50 AM  Result Value Ref Range   WBC 8.4 4.0 - 10.5 K/uL   RBC 3.15 (L) 4.22 - 5.81 MIL/uL   Hemoglobin 8.9 (L) 13.0 - 17.0 g/dL   HCT 27.8 (L) 39.0 - 52.0 %   MCV 88.3 80.0 - 100.0 fL   MCH 28.3 26.0 - 34.0 pg   MCHC 32.0 30.0 - 36.0 g/dL   RDW 17.3 (H) 11.5 - 15.5 %   Platelets 261 150 - 400 K/uL   nRBC 0.0 0.0 - 0.2 %    Comment: Performed at [facility], [place] [address], [place], [place] [address]  Potassium     Status: None   Collection Time: 07/24/22  6:45 AM  Result Value Ref Range   Potassium 3.5 3.5 - 5.1 mmol/L    Comment: Performed at [facility] [address] [place], [place] [address]   DG Chest 2 View  Result Date: 07/23/2022 CLINICAL DATA:  z shortness of breath EXAM: CHEST - 2 VIEW COMPARISON:  Chest x-ray dated May 31, 2022 FINDINGS: Visualized cardiac and mediastinal contours are unchanged. Small bilateral pleural effusions and lower lung predominant heterogeneous opacities. IMPRESSION: 1. Lower lung predominant heterogeneous opacities, differential considerations include pulmonary edema or infection. 2. Small bilateral pleural effusions. Electronically Signed   By: Yetta Glassman M.D.   On: 07/23/2022 12:08    PMH:   Past Medical History:  Diagnosis Date   Arthritis    GERD (gastroesophageal reflux  disease)    History of colon polyps    History of prostate  cancer    Hyperlipidemia    Hypertension    Kidney cysts    bilateral.  Renal US   Mitral regurgitation 12/02/2020    PSH:   Past Surgical History:  Procedure Laterality Date   BIOPSY  05/05/2021   Procedure: BIOPSY;  Surgeon: Thornton Park, MD;  Location: [facility];  Service: Gastroenterology;;   COLONOSCOPY     CYSTOSCOPY  02/12/99   biopsy   ESOPHAGEAL BANDING N/A 08/04/2012   Procedure: ESOPHAGEAL BANDING;  Surgeon: Inda Castle, MD;  Location: [facility];  Service: Endoscopy;  Laterality: N/A;   ESOPHAGOGASTRODUODENOSCOPY N/A 08/04/2012   Procedure: ESOPHAGOGASTRODUODENOSCOPY (EGD);  Surgeon: Inda Castle, MD;  Location: [facility];  Service: Endoscopy;  Laterality: N/A;   ESOPHAGOGASTRODUODENOSCOPY (EGD) WITH PROPOFOL N/A 05/05/2021   Procedure: ESOPHAGOGASTRODUODENOSCOPY (EGD) WITH PROPOFOL;  Surgeon: Thornton Park, MD;  Location: [facility];  Service: Gastroenterology;  Laterality: N/A;   INGUINAL HERNIA REPAIR  02/1999   Dr. Reece Agar   POLYPECTOMY  05/05/2021   Procedure: POLYPECTOMY;  Surgeon: Thornton Park, MD;  Location: [facility];  Service: Gastroenterology;;   PROSTATECTOMY  02/1999    Allergies:  Allergies  Allergen Reactions   Lisinopril     cough   Nsaids     Elevated Cr    Medications:   Prior to Admission medications   Medication Sig Start Date End Date Taking? Authorizing Provider  acetaminophen (TYLENOL) 325 MG tablet Take 2 tablets (650 mg total) by mouth every 6 (six) hours as needed. Patient taking differently: Take 650 mg by mouth every 6 (six) hours as needed for mild pain. 11/01/21  Yes Jill Alexanders, PA-C  ferrous sulfate 325 (65 FE) MG tablet Take 1 tablet (325 mg total) by mouth daily with breakfast. 11/21/21 11/21/22 Yes Tonia Ghent, MD  furosemide (LASIX) 40 MG tablet Take 1 tablet (40 mg total) by mouth daily. 06/10/22 09/08/22 Yes Mariel Aloe, MD  isosorbide-hydrALAZINE (BIDIL) 20-37.5 MG tablet Take 1  tablet by mouth 2 (two) times daily. 06/09/22 09/07/22 Yes Mariel Aloe, MD  latanoprost (XALATAN) 0.005 % ophthalmic solution Place 1 drop into both eyes at bedtime.   Yes [provider]  levothyroxine (SYNTHROID) 50 MCG tablet Take 1 tablet (50 mcg total) by mouth daily. Take on empty stomach early in the morning. 12/29/21  Yes Tonia Ghent, MD  metoprolol succinate (TOPROL-XL) 25 MG 24 hr tablet Take 0.5 tablets (12.5 mg total) by mouth daily. 06/10/22 09/08/22 Yes Mariel Aloe, MD  omeprazole (PRILOSEC OTC) 20 MG tablet Take 1 tablet (20 mg total) by mouth daily. 12/07/20  Yes Tonia Ghent, MD  sodium bicarbonate 650 MG tablet Take 2 tablets (1,300 mg total) by mouth 2 (two) times daily. 11/01/21  Yes Jill Alexanders, PA-C  tamsulosin (FLOMAX) 0.4 MG CAPS capsule Take 0.4 mg by mouth daily. 05/30/22  Yes [provider]  timolol (BETIMOL) 0.5 % ophthalmic solution Place 1 drop into both eyes 2 (two) times daily.   Yes [provider]    Discontinued Meds:   Medications Discontinued During This Encounter  Medication Reason   sodium bicarbonate tablet 1,300 mg     Social History:  reports that he quit smoking about 20 years ago. His smoking use included cigarettes. He has quit using smokeless tobacco.  His smokeless tobacco use included chew. He reports current alcohol use. He reports that  he does not use drugs.  Family History:   Family History  Problem Relation Age of Onset   Bone cancer Sister    Stomach cancer Brother    Stomach cancer Brother    Lung cancer Sister    Colon cancer Sister    Stroke Mother    Colon cancer Other        nephew    Blood pressure 136/78, pulse 65, temperature 98.5 F (36.9 C), temperature source Oral, resp. rate 18, height 5' 8"$  (1.727 m), weight 66.7 kg, SpO2 96 %. General appearance: alert, cooperative, and no distress Resp: Poorly able to assess, clear anteriorly, normal work of breathing Cardio: Regular rate  and rhythm, 4/6 holosystolic murmur most prominent at left apex GI: soft, non-tender; bowel sounds normal; no masses,  no organomegaly Extremities: 3+ pitting edema of BLEs and 2+ pitting edema of upper extremities  Wells Guiles, DO 07/24/2022, 11:23 AM

## 2022-07-24 NOTE — Assessment & Plan Note (Deleted)
Pt with volume overload in setting of CKD 5.  Creat 4.8 today up from 3.8 last month. Also CHF (see below). I suspect his volume issues are more nephrogenic at this point given very elevated creatinine (GFR 12 today down from 15 last month), and VERY poor UOP despite high lasix dose in ED (just 200cc UOP several hours after 142m total lasix dose in ED). Looks like his nephrologist, Dr. SCandiss Norse also suspected dialysis would be in his near future given that Dr. SCandiss Norsehad referred patient to (and patient saw) vascular surgery for fistula vs graft placement just a couple of days ago (see Dr. CClaretha Coopernote). Pt confirms he would want dialysis if necessary to save his life. Confirms still full code, full scope of treatment (see also discussion on 06/05/22) Nephrology has recommended continued attempts at aggressive diuresis.  Monitor volume status, electrolytes, and creatinine.

## 2022-07-24 NOTE — Progress Notes (Signed)
1910 patient alert wife at bedside on room air wheezing can he heard with standing at bedside day RN said patient has been this way and she got an order for PRN medication for breathing treatments RT was already called.   2030 RT has not come so RN gave breathing treatment patient and wife has lack of education and knowledge on diet and CHF could benefit from a dietician consult. Patient wife asked "Are sausages were bad for patient to eat cause he eats those all day."

## 2022-07-24 NOTE — Progress Notes (Signed)
Pt arrived to 3E13, VSS, Chg complete, orders checked, pt oriented to unit, call light within reach.   [name], RN 07/24/2022 3:37 PM

## 2022-07-24 NOTE — ED Notes (Signed)
ED TO INPATIENT HANDOFF REPORT  ED Nurse Name and Phone #: Sheryle Spray [phone]  S Name/Age/Gender Rick [name] 89 y.o. male Room/Bed: 043C/043C  Code Status   Code Status: Full Code  Home/SNF/Other Home Patient oriented to: self, place, time, and situation Is this baseline? Yes   Triage Complete: Triage complete  Chief Complaint Volume overload [E87.70]  Triage Note Patient complains of exertional shortness of breath that started two days ago and resolves with rest. Denies chest pain, denies nausea, is alert, oriented, speaking in complete sentences, and is in no apparent distress.   Allergies Allergies  Allergen Reactions   Lisinopril     cough   Nsaids     Elevated Cr    Level of Care/Admitting Diagnosis ED Disposition     ED Disposition  Admit   Condition  --   Comment  Hospital Area: [facility] [[id number]]  Level of Care: Telemetry Cardiac [[id number]]  May place patient in observation at [facility] or [facility] if equivalent level of care is available:: No  Covid Evaluation: Asymptomatic - no recent exposure (last 10 days) testing not required  Diagnosis: Volume overload [id number]  Admitting Physician: Etta Quill [id number]  Attending Physician: Etta Quill [[id number]]          B Medical/Surgery History Past Medical History:  Diagnosis Date   Arthritis    GERD (gastroesophageal reflux disease)    History of colon polyps    History of prostate cancer    Hyperlipidemia    Hypertension    Kidney cysts    bilateral.  Renal US   Mitral regurgitation 12/02/2020   Past Surgical History:  Procedure Laterality Date   BIOPSY  05/05/2021   Procedure: BIOPSY;  Surgeon: Thornton Park, MD;  Location: [facility];  Service: Gastroenterology;;   COLONOSCOPY     CYSTOSCOPY  02/12/99   biopsy   ESOPHAGEAL BANDING N/A 08/04/2012   Procedure: ESOPHAGEAL BANDING;  Surgeon: Inda Castle, MD;  Location: [facility];  Service: Endoscopy;   Laterality: N/A;   ESOPHAGOGASTRODUODENOSCOPY N/A 08/04/2012   Procedure: ESOPHAGOGASTRODUODENOSCOPY (EGD);  Surgeon: Inda Castle, MD;  Location: [facility];  Service: Endoscopy;  Laterality: N/A;   ESOPHAGOGASTRODUODENOSCOPY (EGD) WITH PROPOFOL N/A 05/05/2021   Procedure: ESOPHAGOGASTRODUODENOSCOPY (EGD) WITH PROPOFOL;  Surgeon: Thornton Park, MD;  Location: [facility];  Service: Gastroenterology;  Laterality: N/A;   INGUINAL HERNIA REPAIR  02/1999   Dr. Reece Agar   POLYPECTOMY  05/05/2021   Procedure: POLYPECTOMY;  Surgeon: Thornton Park, MD;  Location: [facility];  Service: Gastroenterology;;   PROSTATECTOMY  02/1999     A IV Location/Drains/Wounds Patient Lines/Drains/Airways Status     Active Line/Drains/Airways     Name Placement date Placement time Site Days   Peripheral IV 07/23/22 22 G 1.75" Left;Anterior Forearm 07/23/22  1824  Forearm  1   Peripheral IV 07/24/22 20 G 1.88" Left;Upper Arm 07/24/22  0555  Arm  less than 1   Wound / Incision (Open or Dehisced) 10/22/21 Skin tear Hand Left;Posterior 10/22/21  --  Hand  275   Wound / Incision (Open or Dehisced) 11/01/21 Other (Comment) Sacrum pinehole opening from previous cyst, not a pressure injury 11/01/21  0800  Sacrum  265            Intake/Output Last 24 hours  Intake/Output Summary (Last 24 hours) at 07/24/2022 1110 Last data filed at 07/24/2022 1106 Gross per 24 hour  Intake --  Output 500 ml  Net -  500 ml    Labs/Imaging Results for orders placed or performed during the hospital encounter of 07/23/22 (from the past 48 hour(s))  Brain natriuretic peptide     Status: Abnormal   Collection Time: 07/23/22 11:20 AM  Result Value Ref Range   B Natriuretic Peptide 1,433.9 (H) 0.0 - 100.0 pg/mL    Comment: Performed at [facility] [address], [place], [place] [address]  Comprehensive metabolic panel     Status: Abnormal   Collection Time: 07/23/22 11:20 AM  Result Value Ref Range    Sodium 128 (L) 135 - 145 mmol/L   Potassium 3.8 3.5 - 5.1 mmol/L   Chloride 90 (L) 98 - 111 mmol/L   CO2 26 22 - 32 mmol/L   Glucose, Bld 93 70 - 99 mg/dL    Comment: Glucose reference range applies only to samples taken after fasting for at least 8 hours.   BUN 35 (H) 8 - 23 mg/dL   Creatinine, Ser 4.46 (H) 0.61 - 1.24 mg/dL   Calcium 7.8 (L) 8.9 - 10.3 mg/dL   Total Protein 7.0 6.5 - 8.1 g/dL   Albumin 1.7 (L) 3.5 - 5.0 g/dL   AST 20 15 - 41 U/L   ALT 9 0 - 44 U/L   Alkaline Phosphatase 97 38 - 126 U/L   Total Bilirubin 0.9 0.3 - 1.2 mg/dL   GFR, Estimated 12 (L) >60 mL/min    Comment: (NOTE) Calculated using the CKD-EPI Creatinine Equation (2021)    Anion gap 12 5 - 15    Comment: Performed at [facility], [place] [address], [place], [place] [address]  Troponin I (High Sensitivity)     Status: None   Collection Time: 07/23/22  6:30 PM  Result Value Ref Range   Troponin I (High Sensitivity) 16 <18 ng/L    Comment: (NOTE) Elevated high sensitivity troponin I (hsTnI) values and significant  changes across serial measurements may suggest ACS but many other  chronic and acute conditions are known to elevate hsTnI results.  Refer to the "Links" section for chest pain algorithms and additional  guidance. Performed at [facility], [place] [address] [place], [place] [address]   Urinalysis, Routine w reflex microscopic -Urine, Clean Catch     Status: Abnormal   Collection Time: 07/23/22  6:50 PM  Result Value Ref Range   Color, Urine YELLOW YELLOW   APPearance TURBID (A) CLEAR   Specific Gravity, Urine 1.013 1.005 - 1.030   pH 6.0 5.0 - 8.0   Glucose, UA NEGATIVE NEGATIVE mg/dL   Hgb urine dipstick SMALL (A) NEGATIVE   Bilirubin Urine NEGATIVE NEGATIVE   Ketones, ur 5 (A) NEGATIVE mg/dL   Protein, ur >300 (A) NEGATIVE mg/dL   Nitrite NEGATIVE NEGATIVE   Leukocytes,Ua MODERATE (A) NEGATIVE    Comment: Performed at [facility] [address], [place],  [place] [address]  Urinalysis, Microscopic (reflex)     Status: Abnormal   Collection Time: 07/23/22  6:50 PM  Result Value Ref Range   RBC / HPF 0-5 0 - 5 RBC/hpf   WBC, UA >50 0 - 5 WBC/hpf   Bacteria, UA MANY (A) NONE SEEN   Squamous Epithelial / HPF 11-20 0 - 5 /HPF   Hyaline Casts, UA PRESENT     Comment: Performed at [facility], [place] [address], [place], [place] [address]  CBC with Differential     Status: Abnormal  Collection Time: 07/23/22  8:50 PM  Result Value Ref Range   WBC 9.6 4.0 - 10.5 K/uL   RBC 3.09 (L) 4.22 - 5.81 MIL/uL   Hemoglobin 8.8 (L) 13.0 - 17.0 g/dL   HCT 27.0 (L) 39.0 - 52.0 %   MCV 87.4 80.0 - 100.0 fL   MCH 28.5 26.0 - 34.0 pg   MCHC 32.6 30.0 - 36.0 g/dL   RDW 17.1 (H) 11.5 - 15.5 %   Platelets 282 150 - 400 K/uL   nRBC 0.0 0.0 - 0.2 %   Neutrophils Relative % 88 %   Neutro Abs 8.4 (H) 1.7 - 7.7 K/uL   Lymphocytes Relative 7 %   Lymphs Abs 0.7 0.7 - 4.0 K/uL   Monocytes Relative 5 %   Monocytes Absolute 0.5 0.1 - 1.0 K/uL   Eosinophils Relative 0 %   Eosinophils Absolute 0.0 0.0 - 0.5 K/uL   Basophils Relative 0 %   Basophils Absolute 0.0 0.0 - 0.1 K/uL   Immature Granulocytes 0 %   Abs Immature Granulocytes 0.02 0.00 - 0.07 K/uL    Comment: Performed at [facility], [address], [place], [place] [address]  I-stat chem 8, ED (not at [facility], [facility] or [facility])     Status: Abnormal   Collection Time: 07/23/22  8:52 PM  Result Value Ref Range   Sodium 129 (L) 135 - 145 mmol/L   Potassium 3.7 3.5 - 5.1 mmol/L   Chloride 92 (L) 98 - 111 mmol/L   BUN 34 (H) 8 - 23 mg/dL   Creatinine, Ser 4.80 (H) 0.61 - 1.24 mg/dL   Glucose, Bld 82 70 - 99 mg/dL    Comment: Glucose reference range applies only to samples taken after fasting for at least 8 hours.   Calcium, Ion 0.98 (L) 1.15 - 1.40 mmol/L   TCO2 25 22 - 32 mmol/L   Hemoglobin 9.9 (L) 13.0 - 17.0 g/dL   HCT 29.0 (L) 39.0 - [id number] %  Basic metabolic panel     Status: Abnormal   Collection Time:  07/24/22  5:50 AM  Result Value Ref Range   Sodium 125 (L) 135 - 145 mmol/L   Potassium  3.5 - 5.1 mmol/L    SENDING RESULTS UNAFFECTED BY HEMOLYSIS PER BETH TEASLEY SINKS RN 07/24/22 0640 M KOROLESKI   Chloride 88 (L) 98 - 111 mmol/L   CO2 24 22 - 32 mmol/L   Glucose, Bld 76 70 - 99 mg/dL    Comment: Glucose reference range applies only to samples taken after fasting for at least 8 hours.   BUN 36 (H) 8 - 23 mg/dL   Creatinine, Ser 4.31 (H) 0.61 - 1.24 mg/dL   Calcium 7.4 (L) 8.9 - 10.3 mg/dL   GFR, Estimated 12 (L) >60 mL/min    Comment: (NOTE) Calculated using the CKD-EPI Creatinine Equation (2021)    Anion gap 13 5 - 15    Comment: Performed at [facility] [address] [place], [place] [address]  CBC     Status: Abnormal   Collection Time: 07/24/22  5:50 AM  Result Value Ref Range   WBC 8.4 4.0 - 10.5 K/uL   RBC 3.15 (L) 4.22 - 5.81 MIL/uL   Hemoglobin 8.9 (L) 13.0 - 17.0 g/dL   HCT 27.8 (L) 39.0 - 52.0 %   MCV 88.3 80.0 - 100.0 fL   MCH 28.3 26.0 - 34.0 pg   MCHC 32.0 30.0 - 36.0  g/dL   RDW 17.3 (H) 11.5 - 15.5 %   Platelets 261 150 - 400 K/uL   nRBC 0.0 0.0 - 0.2 %    Comment: Performed at [facility], [place] [address] [place], [place] [address]  Potassium     Status: None   Collection Time: 07/24/22  6:45 AM  Result Value Ref Range   Potassium 3.5 3.5 - 5.1 mmol/L    Comment: Performed at [facility] [address], [place], [place] [address]   DG Chest 2 View  Result Date: 07/23/2022 CLINICAL DATA:  z shortness of breath EXAM: CHEST - 2 VIEW COMPARISON:  Chest x-ray dated May 31, 2022 FINDINGS: Visualized cardiac and mediastinal contours are unchanged. Small bilateral pleural effusions and lower lung predominant heterogeneous opacities. IMPRESSION: 1. Lower lung predominant heterogeneous opacities, differential considerations include pulmonary edema or infection. 2. Small bilateral pleural effusions. Electronically Signed   By: Yetta Glassman M.D.   On: 07/23/2022 12:08    Pending Labs Unresulted Labs (From admission, onward)     Start     Ordered   07/23/22 1955  CBC  Once,   STAT        07/23/22 1955   07/23/22 1855  Urine Culture (for pregnant, neutropenic or urologic patients or patients with an indwelling urinary catheter)  (Urine Labs)  Once,   URGENT       Question:  Indication  Answer:  Dysuria   07/23/22 1854            Vitals/Pain Today's Vitals   07/24/22 0759 07/24/22 0940 07/24/22 0943 07/24/22 1100  BP:  (!) 141/82  136/78  Pulse:  62  65  Resp:  15  18  Temp:   98.5 F (36.9 C)   TempSrc:   Oral   SpO2:  99%  96%  Weight:      Height:      PainSc: 0-No pain       Isolation Precautions No active isolations  Medications Medications  heparin injection 5,000 Units (5,000 Units Subcutaneous Given 07/24/22 0637)  acetaminophen (TYLENOL) tablet 650 mg (has no administration in time range)    Or  acetaminophen (TYLENOL) suppository 650 mg (has no administration in time range)  ondansetron (ZOFRAN) tablet 4 mg (has no administration in time range)    Or  ondansetron (ZOFRAN) injection 4 mg (has no administration in time range)  isosorbide-hydrALAZINE (BIDIL) 20-37.5 MG per tablet 1 tablet (1 tablet Oral Given 07/24/22 0941)  metoprolol succinate (TOPROL-XL) 24 hr tablet 12.5 mg (12.5 mg Oral Given 07/24/22 0941)  levothyroxine (SYNTHROID) tablet 50 mcg (50 mcg Oral Given 07/24/22 0636)  tamsulosin (FLOMAX) capsule 0.4 mg (0.4 mg Oral Given 07/24/22 0941)  timolol (BETIMOL) 0.5 % ophthalmic solution 1 drop (1 drop Both Eyes Not Given 07/24/22 0944)  pantoprazole (PROTONIX) EC tablet 40 mg (40 mg Oral Given 07/24/22 0941)  latanoprost (XALATAN) 0.005 % ophthalmic solution 1 drop (has no administration in time range)  ferrous sulfate tablet 325 mg (325 mg Oral Given 07/24/22 0758)  furosemide (LASIX) 120 mg in dextrose 5 % 50 mL IVPB (has no administration in time range)  furosemide (LASIX)  injection 40 mg (40 mg Intravenous Given 07/23/22 1835)  cefTRIAXone (ROCEPHIN) 1 g in sodium chloride 0.9 % 100 mL IVPB (0 g Intravenous Stopped 07/23/22 2124)  furosemide (LASIX) 120 mg in dextrose 5 % 50 mL IVPB (0 mg Intravenous Stopped 07/24/22 0100)    Mobility  walks with person assist     Focused Assessments Neuro Assessment Handoff:  Swallow screen pass? Yes  Cardiac Rhythm: Normal sinus rhythm       Neuro Assessment: Within Defined Limits Neuro Checks:      Has TPA been given? No If patient is a Neuro Trauma and patient is going to OR before floor call report to [facility] nurse: [phone] or [phone]   R Recommendations: See Admitting Provider Note  Report given to:   Additional Notes:

## 2022-07-24 NOTE — Assessment & Plan Note (Deleted)
EF 30-35% in Dec.  Significant LV dyssynchrony present, also mod-severe MR, mod AR, mild AS. Sees cards in office. Cont Imdur Cont metoprolol Evidenced by pulmonary edema and pleural effusions as well as anasarca.  Nephrology's assistance is appreciated. They have recommended continued aggressive diuresis.

## 2022-07-24 NOTE — Assessment & Plan Note (Deleted)
Polycystic kidney disease.   Sounds like this has progressed to CKD 5 recently, looks like his nephrologist Dr. Candiss Norse recently referred him to [facility] (whom he saw just 6 days ago) for evaluation for dialysis access placement due to deteriorating kidney function. Despite his age, pt pretty independent and high functioning it sounds like: pt confirms he would want dialysis if needed to save his life. Cont bicarb Checking urine culture given the large number of leukocytes in urine, but pt not really having UTI symptoms, fever, etc. Albumin 1.7 today, question Nephrotic syndrome? has > 300 protein in urine... ill defer to nephrology wether a 24h urine protein would even be of any value at this stage though. Currently nephrology does not have any short term plans for HD, although they plan to set him of for access as outpatient following this illness.

## 2022-07-24 NOTE — Plan of Care (Addendum)
  Problem: Health Behavior/Discharge Planning: Goal: Ability to manage health-related needs will improve Outcome: Not Progressing   Problem: Clinical Measurements: Goal: Ability to maintain clinical measurements within normal limits will improve Outcome: Not Progressing Goal: Will remain free from infection Outcome: Not Progressing Goal: Diagnostic test results will improve Outcome: Not Progressing Goal: Respiratory complications will improve Outcome: Not Progressing Goal: Cardiovascular complication will be avoided Outcome: Not Progressing   Problem: Activity: Goal: Risk for activity intolerance will decrease Outcome: Not Progressing   Problem: Nutrition: Goal: Adequate nutrition will be maintained Outcome: Not Progressing   Problem: Coping: Goal: Level of anxiety will decrease Outcome: Not Progressing   Problem: Elimination: Goal: Will not experience complications related to bowel motility Outcome: Not Progressing Goal: Will not experience complications related to urinary retention Outcome: Not Progressing   Problem: Pain Managment: Goal: General experience of comfort will improve Outcome: Not Progressing   Problem: Safety: Goal: Ability to remain free from injury will improve Outcome: Not Progressing   Problem: Skin Integrity: Goal: Risk for impaired skin integrity will decrease Outcome: Not Progressing   Problem: Education: Goal: Ability to demonstrate management of disease process will improve Outcome: Not Progressing Goal: Ability to verbalize understanding of medication therapies will improve Outcome: Not Progressing Goal: Individualized Educational Video(s) Outcome: Not Progressing   Problem: Activity: Goal: Capacity to carry out activities will improve Outcome: Not Progressing   Problem: Cardiac: Goal: Ability to achieve and maintain adequate cardiopulmonary perfusion will improve Outcome: Not Progressing  Patient and wife have no idea regarding  patient diet sodium or fluid restriction intake

## 2022-07-24 NOTE — Progress Notes (Signed)
PROGRESS NOTE  Rick [name] [id number] DOB: 11-Feb-1929 DOA: 07/23/2022 PCP: Tonia Ghent, MD  Brief History   The patient is a 89 yr old man with stage IV CKD. He presents with anasarca and shortness of breath. He is found to have pulmonary edema, anasarca, and pleural effusions. He was given 120 mg Lasix in the ED with 200 cc output over 12 hours. Nephrology has been consulted and wishes to continue attempts to aggressively diurese. They state that the patient will need to be set up for HD access as outpatient. The patient states that he wishes to go on dialysis despite advice to the contrary.  Consultants  Nephrology  Procedures  None  Antibiotics  None  Subjective  The patient is resting comfortably. No new complaints.  Objective   Vitals:  Vitals:   07/24/22 0943 07/24/22 1100  BP:  136/78  Pulse:  65  Resp:  18  Temp: 98.5 F (36.9 C)   SpO2:  96%    Exam:  Constitutional:  Appears calm and comfortable Eyes:  pupils and irises appear normal Normal lids and conjunctivae Periorbital edema Neck:  neck appears normal, no masses, normal ROM, supple no thyromegaly Positive 12 cm JVD. Respiratory:  Pt with increased work of breathing especially with conversation. Tachypnea and accessory muscle use present. Positive for rales and wheezes throughout.  No tactile fremitus Cardiovascular:  RRR, no m/r/g 3+ LE extremity edema bilaterally Normal pedal pulses Abdomen:  Abdomen appears normal; no tenderness or masses No hernias No HSM Musculoskeletal:  Digits/nails BUE: no clubbing, cyanosis, petechiae, infection exam of joints, bones, muscles of at least one of following: head/neck, RUE, LUE, RLE, LLE   strength and tone normal, no atrophy, no abnormal movements No tenderness, masses Normal ROM, no contractures  gait and station Skin:  No rashes, lesions, ulcers palpation of skin: no induration or nodules Neurologic:  CN 2-12 intact Sensation all  4 extremities intact Psychiatric:  Mental status Mood, affect appropriate Orientation to person, place, time  judgment and insight appear intact     I have personally reviewed the following:   Today's Data   Vitals:   07/24/22 0943 07/24/22 1100  BP:  136/78  Pulse:  65  Resp:  18  Temp: 98.5 F (36.9 C)   SpO2:  96%     Lab Data      Latest Ref Rng & Units 07/24/2022    6:45 AM 07/24/2022    5:50 AM 07/23/2022    8:52 PM  BMP  Glucose 70 - 99 mg/dL  76  82   BUN 8 - 23 mg/dL  36  34   Creatinine 0.61 - 1.24 mg/dL  4.31  4.80   Sodium 135 - 145 mmol/L  125  129   Potassium 3.5 - 5.1 mmol/L 3.5  SENDING RESULTS UNAFFECTED BY HEMOLYSIS PER BETH TEASLEY SINKS RN 07/24/22 0640 M KOROLESKI  3.7   Chloride 98 - 111 mmol/L  88  92   CO2 22 - 32 mmol/L  24    Calcium 8.9 - 10.3 mg/dL  7.4     CBC    Component Value Date/Time   WBC 8.4 07/24/2022 0550   RBC 3.15 (L) 07/24/2022 0550   HGB 8.9 (L) 07/24/2022 0550   HGB 9.2 (L) 08/19/2021 1600   HCT 27.8 (L) 07/24/2022 0550   HCT 28.0 (L) 08/19/2021 1600   PLT 261 07/24/2022 0550   PLT 432 08/19/2021 1600   MCV 88.3  07/24/2022 0550   MCV 86 08/19/2021 1600   MCH 28.3 07/24/2022 0550   MCHC 32.0 07/24/2022 0550   RDW 17.3 (H) 07/24/2022 0550   RDW 13.1 08/19/2021 1600   LYMPHSABS 0.7 07/23/2022 2050   MONOABS 0.5 07/23/2022 2050   EOSABS 0.0 07/23/2022 2050   BASOSABS 0.0 07/23/2022 2050    Cardiology Data   Recent Results (from the past 43800 hour(s))  ECHOCARDIOGRAM COMPLETE   Collection Time: 06/01/22  4:00 PM  Result Value   Weight 2,112   Height 67   BP 146/84   Single Plane A2C EF 36.1   Single Plane A4C EF 33.1   Calc EF 33.7   S' Lateral 4.60   AR max vel 1.37   AV Area VTI 1.40   AV Mean grad 8.0   AV Peak grad 14.7   Ao pk vel 1.92   P 1/2 time 644   Area-P 1/2 5.38   Radius 0.50   MV M vel 5.23   AV Area mean vel 1.49   MV Peak grad 109.4   Narrative      ECHOCARDIOGRAM REPORT        Patient Name:   Rick [name] Date of Exam: 06/01/2022 Medical Rec #:  [id number]       Height:       67.0 in Accession #:    [id number]      Weight:       132.0 lb Date of Birth:  [birth year]       BSA:          1.695 m Patient Age:    47 years        BP:           127/70 mmHg Patient Gender: M               HR:           89 bpm. Exam Location:  Inpatient  Procedure: 2D Echo, Cardiac Doppler and Color Doppler  Indications:    CHF-Acute Diastolic [id number]   History:        Patient has prior history of Echocardiogram examinations, most                 recent 12/01/2020. CHF; Risk Factors:Hypertension and                 Dyslipidemia. TOBACCO ABUSE, HX OF, History of prostate cancer.   Sonographer:    Alvino Chapel RCS Referring Phys: [id number] OLADAPO ADEFESO  IMPRESSIONS    1. LVEF 30-35% with global hypokinesis. Significant LV dyssynchrony present. Left ventricular ejection fraction, by estimation, is 30 to 35%. The left ventricle has moderately decreased function. The left ventricle demonstrates global hypokinesis. There  is mild concentric left ventricular hypertrophy. Indeterminate diastolic filling due to E-A fusion.  2. Right ventricular systolic function is normal. The right ventricular size is normal.  3. Left atrial size was moderately dilated.  4. Right atrial size was mildly dilated.  5. A small pericardial effusion is present. The pericardial effusion is anterior to the right ventricle.  6. Moderate to severe MR. Eccentric jet. The mitral valve is grossly normal. Moderate to severe mitral valve regurgitation. No evidence of mitral stenosis.  7. The aortic valve is tricuspid. There is moderate calcification of the aortic valve. There is moderate thickening of the aortic valve. Aortic valve regurgitation is moderate. Mild aortic valve stenosis.  Comparison(s): Changes from prior study are noted. LVEF now  30-35%. Moderate to severe MR remains present. AI is moderate.  Significant LV dyssynchrony present consistent with LBBB.  FINDINGS  Left Ventricle: LVEF 30-35% with global hypokinesis. Significant LV dyssynchrony present. Left ventricular ejection fraction, by estimation, is 30 to 35%. The left ventricle has moderately decreased function. The left ventricle demonstrates global  hypokinesis. The left ventricular internal cavity size was normal in size. There is mild concentric left ventricular hypertrophy. Abnormal (paradoxical) septal motion, consistent with left bundle branch block. Indeterminate diastolic filling due to E-A  fusion.  Right Ventricle: The right ventricular size is normal. No increase in right ventricular wall thickness. Right ventricular systolic function is normal.  Left Atrium: Left atrial size was moderately dilated.  Right Atrium: Right atrial size was mildly dilated.  Pericardium: A small pericardial effusion is present. The pericardial effusion is anterior to the right ventricle.  Mitral Valve: Moderate to severe MR. Eccentric jet. The mitral valve is grossly normal. Moderate to severe mitral valve regurgitation. No evidence of mitral valve stenosis.  Tricuspid Valve: The tricuspid valve is grossly normal. Tricuspid valve regurgitation is mild . No evidence of tricuspid stenosis.  Aortic Valve: The aortic valve is tricuspid. There is moderate calcification of the aortic valve. There is moderate thickening of the aortic valve. Aortic valve regurgitation is moderate. Aortic regurgitation PHT measures 644 msec. Mild aortic stenosis  is present. Aortic valve mean gradient measures 8.0 mmHg. Aortic valve peak gradient measures 14.7 mmHg. Aortic valve area, by VTI measures 1.40 cm.  Pulmonic Valve: The pulmonic valve was grossly normal. Pulmonic valve regurgitation is mild. No evidence of pulmonic stenosis.  Aorta: The aortic root is normal in size and structure.  Venous: The inferior vena cava was not well visualized.  IAS/Shunts:  The atrial septum is grossly normal.    LEFT VENTRICLE PLAX 2D LVIDd:         5.40 cm      Diastology LVIDs:         4.60 cm      LV e' medial:    7.07 cm/s LV PW:         1.20 cm      LV E/e' medial:  13.0 LV IVS:        1.40 cm      LV e' lateral:   9.25 cm/s LVOT diam:     1.70 cm      LV E/e' lateral: 9.9 LV SV:         55 LV SV Index:   32 LVOT Area:     2.27 cm   LV Volumes (MOD) LV vol d, MOD A2C: 119.0 ml LV vol d, MOD A4C: 157.0 ml LV vol s, MOD A2C: 76.0 ml LV vol s, MOD A4C: 105.0 ml LV SV MOD A2C:     43.0 ml LV SV MOD A4C:     157.0 ml LV SV MOD BP:      49.3 ml  RIGHT VENTRICLE RV S prime:     13.20 cm/s TAPSE (M-mode): 2.0 cm  LEFT ATRIUM              Index        RIGHT ATRIUM           Index LA diam:        3.20 cm  1.89 cm/m   RA Area:     24.40 cm LA Vol (A2C):   120.0 ml 70.80 ml/m  RA Volume:   70.00 ml  41.30 ml/m LA Vol (A4C):   49.6 ml  29.26 ml/m LA Biplane Vol: 78.4 ml  46.26 ml/m  AORTIC VALVE AV Area (Vmax):    1.37 cm AV Area (Vmean):   1.49 cm AV Area (VTI):     1.40 cm AV Vmax:           192.00 cm/s AV Vmean:          122.000 cm/s AV VTI:            0.391 m AV Peak Grad:      14.7 mmHg AV Mean Grad:      8.0 mmHg LVOT Vmax:         116.00 cm/s LVOT Vmean:        80.200 cm/s LVOT VTI:          0.241 m LVOT/AV VTI ratio: 0.62 AI PHT:            644 msec   AORTA Ao Root diam: 3.30 cm  MITRAL VALVE                  TRICUSPID VALVE MV Area (PHT): 5.38 cm       TR Peak grad:   24.0 mmHg MV Decel Time: 141 msec       TR Vmax:        245.00 cm/s MR Peak grad:    109.4 mmHg MR Mean grad:    57.0 mmHg    SHUNTS MR Vmax:         523.00 cm/s  Systemic VTI:  0.24 m MR Vmean:        340.0 cm/s   Systemic Diam: 1.70 cm MR PISA:         1.57 cm MR PISA Eff ROA: 9 mm MR PISA Radius:  0.50 cm MV E velocity: 91.70 cm/s  Eleonore Chiquito MD Electronically signed by Eleonore Chiquito MD Signature Date/Time: 06/01/2022/4:29:52 PM        Final     *Note: Due to a large number of results and/or encounters for the requested time period, some results have not been displayed. A complete set of results can be found in Results Review.     Scheduled Meds:  ferrous sulfate  325 mg Oral Q breakfast   heparin  5,000 Units Subcutaneous Q8H   isosorbide-hydrALAZINE  1 tablet Oral BID   latanoprost  1 drop Both Eyes QHS   levothyroxine  50 mcg Oral Q0600   metoprolol succinate  12.5 mg Oral Daily   pantoprazole  40 mg Oral Daily   polyethylene glycol  17 g Oral Daily   tamsulosin  0.4 mg Oral Daily   timolol  1 drop Both Eyes BID   Continuous Infusions:  furosemide Stopped (07/24/22 1342)    Principal Problem:   Volume overload Active Problems:   Acute on chronic combined systolic and diastolic CHF (congestive heart failure) (HCC)   Polycystic kidney disease   Stage 4 chronic kidney disease (HCC)   LOS: 0 days   A & P  Stage 4 chronic kidney disease ([place]) Sounds like this has progressed to CKD 5 recently, looks like his nephrologist Dr. Candiss Norse recently referred him to vasc surg (whom he saw just 6 days ago) for evaluation for dialysis access placement due to deteriorating kidney function. Despite his age, pt pretty independent and high functioning it sounds like: pt confirms he would want dialysis if needed to save his life. Cont bicarb Checking urine culture given  the large number of leukocytes in urine, but pt not really having UTI symptoms, fever, etc. Albumin 1.7 today, question Nephrotic syndrome? has > 300 protein in urine... ill defer to nephrology wether a 24h urine protein would even be of any value at this stage though. Currently nephrology does not have any short term plans for HD, although they plan to set him of for access as outpatient following this illness.  Volume overload Pt with volume overload in setting of CKD 5.  Creat 4.8 today up from 3.8 last month. Also CHF (see below). I suspect his volume  issues are more nephrogenic at this point given very elevated creatinine (GFR 12 today down from 15 last month), and VERY poor UOP despite high lasix dose in ED (just 200cc UOP several hours after 121m total lasix dose in ED). Looks like his nephrologist, Dr. SCandiss Norse also suspected dialysis would be in his near future given that Dr. SCandiss Norsehad referred patient to (and patient saw) vascular surgery for fistula vs graft placement just a couple of days ago (see Dr. CClaretha Coopernote). Pt confirms he would want dialysis if necessary to save his life. Confirms still full code, full scope of treatment (see also discussion on 06/05/22) Nephrology has recommended continued attempts at aggressive diuresis.  Monitor volume status, electrolytes, and creatinine.  Acute on chronic combined systolic and diastolic CHF (congestive heart failure) (HCC) EF 30-35% in Dec.  Significant LV dyssynchrony present, also mod-severe MR, mod AR, mild AS. Sees cards in office. Cont Imdur Cont metoprolol Evidenced by pulmonary edema and pleural effusions as well as anasarca.  Nephrology's assistance is appreciated. They have recommended continued aggressive diuresis.  Polycystic kidney disease Noted. Nephrology has been consulted.  DVT prophylaxis: Heparin 5000 units [place] q8 hours. Code Status: Full Code Family Communication: None available Disposition Plan: tbd    Iisha Soyars, DO [facility] Direct contact: see [email or web address]  7PM-7AM contact night coverage as above 07/24/2022, 1:46 PM  LOS: 0 days

## 2022-07-25 ENCOUNTER — Encounter (HOSPITAL_COMMUNITY): Payer: Medicare PPO

## 2022-07-25 DIAGNOSIS — I5043 Acute on chronic combined systolic (congestive) and diastolic (congestive) heart failure: Secondary | ICD-10-CM | POA: Diagnosis not present

## 2022-07-25 DIAGNOSIS — I501 Left ventricular failure: Secondary | ICD-10-CM

## 2022-07-25 DIAGNOSIS — Q613 Polycystic kidney, unspecified: Secondary | ICD-10-CM | POA: Diagnosis not present

## 2022-07-25 DIAGNOSIS — N184 Chronic kidney disease, stage 4 (severe): Secondary | ICD-10-CM | POA: Diagnosis not present

## 2022-07-25 LAB — FERRITIN: Ferritin: 76 ng/mL (ref 24–336)

## 2022-07-25 LAB — CBC WITH DIFFERENTIAL/PLATELET
Abs Immature Granulocytes: 0.04 10*3/uL (ref 0.00–0.07)
Basophils Absolute: 0 10*3/uL (ref 0.0–0.1)
Basophils Relative: 0 %
Eosinophils Absolute: 0.1 10*3/uL (ref 0.0–0.5)
Eosinophils Relative: 1 %
HCT: 25.2 % — ABNORMAL LOW (ref 39.0–52.0)
Hemoglobin: 8.1 g/dL — ABNORMAL LOW (ref 13.0–17.0)
Immature Granulocytes: 1 %
Lymphocytes Relative: 7 %
Lymphs Abs: 0.5 10*3/uL — ABNORMAL LOW (ref 0.7–4.0)
MCH: 27.3 pg (ref 26.0–34.0)
MCHC: 32.1 g/dL (ref 30.0–36.0)
MCV: 84.8 fL (ref 80.0–100.0)
Monocytes Absolute: 0.4 10*3/uL (ref 0.1–1.0)
Monocytes Relative: 6 %
Neutro Abs: 6.5 10*3/uL (ref 1.7–7.7)
Neutrophils Relative %: 85 %
Platelets: 266 10*3/uL (ref 150–400)
RBC: 2.97 MIL/uL — ABNORMAL LOW (ref 4.22–5.81)
RDW: 16.9 % — ABNORMAL HIGH (ref 11.5–15.5)
WBC: 7.6 10*3/uL (ref 4.0–10.5)
nRBC: 0 % (ref 0.0–0.2)

## 2022-07-25 LAB — BASIC METABOLIC PANEL
Anion gap: 10 (ref 5–15)
BUN: 32 mg/dL — ABNORMAL HIGH (ref 8–23)
CO2: 26 mmol/L (ref 22–32)
Calcium: 7.5 mg/dL — ABNORMAL LOW (ref 8.9–10.3)
Chloride: 92 mmol/L — ABNORMAL LOW (ref 98–111)
Creatinine, Ser: 4.15 mg/dL — ABNORMAL HIGH (ref 0.61–1.24)
GFR, Estimated: 13 mL/min — ABNORMAL LOW (ref >60)
Glucose, Bld: 124 mg/dL — ABNORMAL HIGH (ref 70–99)
Potassium: 3.3 mmol/L — ABNORMAL LOW (ref 3.5–5.1)
Sodium: 128 mmol/L — ABNORMAL LOW (ref 135–145)

## 2022-07-25 LAB — IRON AND TIBC
Iron: 51 ug/dL (ref 45–182)
Saturation Ratios: 38 % (ref 17.9–39.5)
TIBC: 134 ug/dL — ABNORMAL LOW (ref 250–450)
UIBC: 83 ug/dL

## 2022-07-25 LAB — MAGNESIUM: Magnesium: 2.1 mg/dL (ref 1.7–2.4)

## 2022-07-25 LAB — ALBUMIN: Albumin: 1.6 g/dL — ABNORMAL LOW (ref 3.5–5.0)

## 2022-07-25 LAB — POTASSIUM: Potassium: 4.4 mmol/L (ref 3.5–5.1)

## 2022-07-25 MED ORDER — SODIUM CHLORIDE 0.9 % IV SOLN
250.0000 mg | Freq: Every day | INTRAVENOUS | Status: AC
  Administered 2022-07-25 – 2022-07-26 (×2): 250 mg via INTRAVENOUS
  Filled 2022-07-25 (×2): qty 20

## 2022-07-25 MED ORDER — POTASSIUM CHLORIDE CRYS ER 20 MEQ PO TBCR
40.0000 meq | EXTENDED_RELEASE_TABLET | Freq: Four times a day (QID) | ORAL | Status: AC
  Administered 2022-07-25 (×2): 40 meq via ORAL
  Filled 2022-07-25 (×2): qty 2

## 2022-07-25 MED ORDER — ALBUMIN HUMAN 25 % IV SOLN
25.0000 g | Freq: Four times a day (QID) | INTRAVENOUS | Status: AC
  Administered 2022-07-25 (×2): 25 g via INTRAVENOUS
  Filled 2022-07-25 (×2): qty 100

## 2022-07-25 MED ORDER — ALBUTEROL SULFATE (2.5 MG/3ML) 0.083% IN NEBU
2.5000 mg | INHALATION_SOLUTION | RESPIRATORY_TRACT | Status: DC | PRN
  Administered 2022-07-25 – 2022-08-03 (×4): 2.5 mg via RESPIRATORY_TRACT
  Filled 2022-07-25 (×5): qty 3

## 2022-07-25 MED ORDER — DARBEPOETIN ALFA 60 MCG/0.3ML IJ SOSY
60.0000 ug | PREFILLED_SYRINGE | INTRAMUSCULAR | Status: DC
  Administered 2022-07-26: 60 ug via SUBCUTANEOUS
  Filled 2022-07-25: qty 0.3

## 2022-07-25 NOTE — Consult Note (Signed)
   [facility] Inpatient Consult   07/25/2022  LOWIS MASHEK 04/11/1929 [id number]  [place] Organization [ACO] Patient: Josephine Igo PPO  Primary Care Provider:  Tonia Ghent, MD, with [place] [facility]   Patient screened for hospitalization with noted high risk score for unplanned readmission risk and  to assess for potential [place] Management service needs for post hospital transition for care coordination.  Review of patient's electronic medical record and discussion in unit progression meeting for community care coordination as patient diagnosis for ongoing follow up. Met with patient on unit rounds and initially eating lunch, returned after lunch and met with patient, daughter and son-in-law at the bedside. Chart review reveals patient is for ongoing follow up medical management needs for Advanced Renal failure and HF exacerbation.  Plan:  Continue to follow progress and disposition to assess for post hospital community care coordination/management needs.  Referral request for community care coordination: refer for care coordination does not understand diet and HF zones however, patient states he has a long road to go. Explained will continue to follow up. Patient agrees for ongoing.  Of note, [facility] Care Management/Population Health does not replace or interfere with any arrangements made by the Inpatient Transition of Care team.  For questions contact:   Natividad Brood, RN BSN [place]  [phone] business mobile phone Toll free office [phone]  *[place]  [phone] Fax number: [phone] [email or web address]    .

## 2022-07-25 NOTE — Progress Notes (Signed)
   07/25/22 1300  Assess: MEWS Score  Temp (!) 94.7 F (34.8 C)  BP 139/79  MAP (mmHg) 96  Pulse Rate 64  ECG Heart Rate 64  Resp 20  Level of Consciousness Alert  SpO2 98 %  O2 Device Room Air  Assess: MEWS Score  MEWS Temp 2  MEWS Systolic 0  MEWS Pulse 0  MEWS RR 0  MEWS LOC 0  MEWS Score 2  MEWS Score Color Yellow  Assess: if the MEWS score is Yellow or Red  Were vital signs taken at a resting state? Yes  Focused Assessment No change from prior assessment  Does the patient meet 2 or more of the SIRS criteria? No  MEWS guidelines implemented  Yes, yellow  Treat  MEWS Interventions Considered administering scheduled or prn medications/treatments as ordered  Take Vital Signs  Increase Vital Sign Frequency  Yellow: Q2hr x1, continue Q4hrs until patient remains green for 12hrs  Escalate  MEWS: Escalate Yellow: Discuss with charge nurse and consider notifying provider and/or RRT  Notify: Charge Nurse/RN  Name of Charge Nurse/RN Notified Beverlee Nims, RN  Provider Notification  Provider Name/Title [facility]  Date Provider Notified 07/25/22  Time Provider Notified 1300  Method of Notification Page  Notification Reason Other (Comment) (Pt rectal temp 94.7-- unable to obtain oral or axillary temp)  Provider response See new orders  Date of Provider Response 07/25/22  Time of Provider Response 1310  Assess: SIRS CRITERIA  SIRS Temperature  1  SIRS Pulse 0  SIRS Respirations  0  SIRS WBC 0  SIRS Score Sum  1

## 2022-07-25 NOTE — Progress Notes (Signed)
PROGRESS NOTE  Rick [name] [id number] DOB: 1928/12/24 DOA: 07/23/2022 PCP: Tonia Ghent, MD  Brief History   The patient is a 89 yr old man with stage IV CKD. He presents with anasarca and shortness of breath. He is found to have pulmonary edema, anasarca, and pleural effusions. He was given 120 mg Lasix in the ED with 200 cc output over 12 hours. Nephrology has been consulted and wishes to continue attempts to aggressively diurese. They state that the patient will need to be set up for HD access as outpatient. The patient states that he wishes to go on dialysis despite advice to the contrary.  Dr. Joylene Grapes has evaluated the patient. He recommends continued aggressive diuresis with 120 mg lasix tid. He recommends the addition of metolazone for decreased urinary output. Echocardiogram has been ordered to re-evaluate the patient's cardiac function. Most recent echo demonstrated an EF of 30% on 06/01/22.   Consultants  Nephrology  Procedures  None  Antibiotics  None  Subjective  The patient is resting comfortably. No new complaints.  Objective   Vitals:  Vitals:   07/25/22 1116 07/25/22 1300  BP: (!) 143/88 139/79  Pulse:  64  Resp: 19 20  Temp:  (!) 94.7 F (34.8 C)  SpO2: 97% 98%    Exam:  Constitutional:  Appears calm and comfortable Respiratory:  Pt with increased work of breathing especially with conversation. Tachypnea and accessory muscle use present. Positive for rales and wheezes throughout.  No tactile fremitus Cardiovascular:  RRR, no m/r/g 3+ LE extremity edema bilaterally Normal pedal pulses Abdomen:  Abdomen appears normal; no tenderness or masses No hernias No HSM Musculoskeletal:  Digits/nails BUE: no clubbing, cyanosis, petechiae, infection exam of joints, bones, muscles of at least one of following: head/neck, RUE, LUE, RLE, LLE   strength and tone normal, no atrophy, no abnormal movements No tenderness, masses Normal ROM, no  contractures  gait and station Skin:  No rashes, lesions, ulcers palpation of skin: no induration or nodules Neurologic:  CN 2-12 intact Sensation all 4 extremities intact Psychiatric:  Mental status Mood, affect appropriate Orientation to person, place, time  judgment and insight appear intact     I have personally reviewed the following:   Today's Data   Vitals:   07/25/22 1116 07/25/22 1300  BP: (!) 143/88 139/79  Pulse:  64  Resp: 19 20  Temp:  (!) 94.7 F (34.8 C)  SpO2: 97% 98%     Lab Data      Latest Ref Rng & Units 07/25/2022    1:48 AM 07/24/2022    6:45 AM 07/24/2022    5:50 AM  BMP  Glucose 70 - 99 mg/dL 124   76   BUN 8 - 23 mg/dL 32   36   Creatinine 0.61 - 1.24 mg/dL 4.15   4.31   Sodium 135 - 145 mmol/L 128   125   Potassium 3.5 - 5.1 mmol/L 3.3  3.5  SENDING RESULTS UNAFFECTED BY HEMOLYSIS PER BETH TEASLEY SINKS RN 07/24/22 0640 M KOROLESKI   Chloride 98 - 111 mmol/L 92   88   CO2 22 - 32 mmol/L 26   24   Calcium 8.9 - 10.3 mg/dL 7.5   7.4    CBC    Component Value Date/Time   WBC 7.6 07/25/2022 0148   RBC 2.97 (L) 07/25/2022 0148   HGB 8.1 (L) 07/25/2022 0148   HGB 9.2 (L) 08/19/2021 1600   HCT 25.2 (L)  07/25/2022 0148   HCT 28.0 (L) 08/19/2021 1600   PLT 266 07/25/2022 0148   PLT 432 08/19/2021 1600   MCV 84.8 07/25/2022 0148   MCV 86 08/19/2021 1600   MCH 27.3 07/25/2022 0148   MCHC 32.1 07/25/2022 0148   RDW 16.9 (H) 07/25/2022 0148   RDW 13.1 08/19/2021 1600   LYMPHSABS 0.5 (L) 07/25/2022 0148   MONOABS 0.4 07/25/2022 0148   EOSABS 0.1 07/25/2022 0148   BASOSABS 0.0 07/25/2022 0148    Cardiology Data   Recent Results (from the past 43800 hour(s))  ECHOCARDIOGRAM COMPLETE   Collection Time: 06/01/22  4:00 PM  Result Value   Weight 2,112   Height 67   BP 146/84   Single Plane A2C EF 36.1   Single Plane A4C EF 33.1   Calc EF 33.7   S' Lateral 4.60   AR max vel 1.37   AV Area VTI 1.40   AV Mean grad 8.0   AV Peak grad  14.7   Ao pk vel 1.92   P 1/2 time 644   Area-P 1/2 5.38   Radius 0.50   MV M vel 5.23   AV Area mean vel 1.49   MV Peak grad 109.4   Narrative      ECHOCARDIOGRAM REPORT       Patient Name:   Rick [name] Date of Exam: 06/01/2022 Medical Rec #:  [id number]       Height:       67.0 in Accession #:    [id number]      Weight:       132.0 lb Date of Birth:  11-15-1928       BSA:          1.695 m Patient Age:    89 years        BP:           127/70 mmHg Patient Gender: M               HR:           89 bpm. Exam Location:  Inpatient  Procedure: 2D Echo, Cardiac Doppler and Color Doppler  Indications:    CHF-Acute Diastolic [id number]   History:        Patient has prior history of Echocardiogram examinations, most                 recent 12/01/2020. CHF; Risk Factors:Hypertension and                 Dyslipidemia. TOBACCO ABUSE, HX OF, History of prostate cancer.   Sonographer:    Alvino Chapel RCS Referring Phys: [id number] OLADAPO ADEFESO  IMPRESSIONS    1. LVEF 30-35% with global hypokinesis. Significant LV dyssynchrony present. Left ventricular ejection fraction, by estimation, is 30 to 35%. The left ventricle has moderately decreased function. The left ventricle demonstrates global hypokinesis. There  is mild concentric left ventricular hypertrophy. Indeterminate diastolic filling due to E-A fusion.  2. Right ventricular systolic function is normal. The right ventricular size is normal.  3. Left atrial size was moderately dilated.  4. Right atrial size was mildly dilated.  5. A small pericardial effusion is present. The pericardial effusion is anterior to the right ventricle.  6. Moderate to severe MR. Eccentric jet. The mitral valve is grossly normal. Moderate to severe mitral valve regurgitation. No evidence of mitral stenosis.  7. The aortic valve is tricuspid. There is moderate calcification of the aortic valve. There  is moderate thickening of the aortic valve. Aortic valve  regurgitation is moderate. Mild aortic valve stenosis.  Comparison(s): Changes from prior study are noted. LVEF now 30-35%. Moderate to severe MR remains present. AI is moderate. Significant LV dyssynchrony present consistent with LBBB.  FINDINGS  Left Ventricle: LVEF 30-35% with global hypokinesis. Significant LV dyssynchrony present. Left ventricular ejection fraction, by estimation, is 30 to 35%. The left ventricle has moderately decreased function. The left ventricle demonstrates global  hypokinesis. The left ventricular internal cavity size was normal in size. There is mild concentric left ventricular hypertrophy. Abnormal (paradoxical) septal motion, consistent with left bundle branch block. Indeterminate diastolic filling due to E-A  fusion.  Right Ventricle: The right ventricular size is normal. No increase in right ventricular wall thickness. Right ventricular systolic function is normal.  Left Atrium: Left atrial size was moderately dilated.  Right Atrium: Right atrial size was mildly dilated.  Pericardium: A small pericardial effusion is present. The pericardial effusion is anterior to the right ventricle.  Mitral Valve: Moderate to severe MR. Eccentric jet. The mitral valve is grossly normal. Moderate to severe mitral valve regurgitation. No evidence of mitral valve stenosis.  Tricuspid Valve: The tricuspid valve is grossly normal. Tricuspid valve regurgitation is mild . No evidence of tricuspid stenosis.  Aortic Valve: The aortic valve is tricuspid. There is moderate calcification of the aortic valve. There is moderate thickening of the aortic valve. Aortic valve regurgitation is moderate. Aortic regurgitation PHT measures 644 msec. Mild aortic stenosis  is present. Aortic valve mean gradient measures 8.0 mmHg. Aortic valve peak gradient measures 14.7 mmHg. Aortic valve area, by VTI measures 1.40 cm.  Pulmonic Valve: The pulmonic valve was grossly normal. Pulmonic valve  regurgitation is mild. No evidence of pulmonic stenosis.  Aorta: The aortic root is normal in size and structure.  Venous: The inferior vena cava was not well visualized.  IAS/Shunts: The atrial septum is grossly normal.    LEFT VENTRICLE PLAX 2D LVIDd:         5.40 cm      Diastology LVIDs:         4.60 cm      LV e' medial:    7.07 cm/s LV PW:         1.20 cm      LV E/e' medial:  13.0 LV IVS:        1.40 cm      LV e' lateral:   9.25 cm/s LVOT diam:     1.70 cm      LV E/e' lateral: 9.9 LV SV:         55 LV SV Index:   32 LVOT Area:     2.27 cm   LV Volumes (MOD) LV vol d, MOD A2C: 119.0 ml LV vol d, MOD A4C: 157.0 ml LV vol s, MOD A2C: 76.0 ml LV vol s, MOD A4C: 105.0 ml LV SV MOD A2C:     43.0 ml LV SV MOD A4C:     157.0 ml LV SV MOD BP:      49.3 ml  RIGHT VENTRICLE RV S prime:     13.20 cm/s TAPSE (M-mode): 2.0 cm  LEFT ATRIUM              Index        RIGHT ATRIUM           Index LA diam:        3.20 cm  1.89 cm/m  RA Area:     24.40 cm LA Vol (A2C):   120.0 ml 70.80 ml/m  RA Volume:   70.00 ml  41.30 ml/m LA Vol (A4C):   49.6 ml  29.26 ml/m LA Biplane Vol: 78.4 ml  46.26 ml/m  AORTIC VALVE AV Area (Vmax):    1.37 cm AV Area (Vmean):   1.49 cm AV Area (VTI):     1.40 cm AV Vmax:           192.00 cm/s AV Vmean:          122.000 cm/s AV VTI:            0.391 m AV Peak Grad:      14.7 mmHg AV Mean Grad:      8.0 mmHg LVOT Vmax:         116.00 cm/s LVOT Vmean:        80.200 cm/s LVOT VTI:          0.241 m LVOT/AV VTI ratio: 0.62 AI PHT:            644 msec   AORTA Ao Root diam: 3.30 cm  MITRAL VALVE                  TRICUSPID VALVE MV Area (PHT): 5.38 cm       TR Peak grad:   24.0 mmHg MV Decel Time: 141 msec       TR Vmax:        245.00 cm/s MR Peak grad:    109.4 mmHg MR Mean grad:    57.0 mmHg    SHUNTS MR Vmax:         523.00 cm/s  Systemic VTI:  0.24 m MR Vmean:        340.0 cm/s   Systemic Diam: 1.70 cm MR PISA:         1.57 cm MR  PISA Eff ROA: 9 mm MR PISA Radius:  0.50 cm MV E velocity: 91.70 cm/s  Eleonore Chiquito MD Electronically signed by Eleonore Chiquito MD Signature Date/Time: 06/01/2022/4:29:52 PM       Final     *Note: Due to a large number of results and/or encounters for the requested time period, some results have not been displayed. A complete set of results can be found in Results Review.     Scheduled Meds:  ferrous sulfate  325 mg Oral Q breakfast   heparin  5,000 Units Subcutaneous Q8H   isosorbide-hydrALAZINE  1 tablet Oral BID   latanoprost  1 drop Both Eyes QHS   levothyroxine  50 mcg Oral Q0600   metoprolol succinate  12.5 mg Oral Daily   pantoprazole  40 mg Oral Daily   polyethylene glycol  17 g Oral Daily   tamsulosin  0.4 mg Oral Daily   timolol  1 drop Both Eyes BID   Continuous Infusions:  albumin human     ferric gluconate (FERRLECIT) IVPB 250 mg (07/25/22 1358)   furosemide 120 mg (07/25/22 0904)    Principal Problem:   Volume overload Active Problems:   Acute on chronic combined systolic and diastolic CHF (congestive heart failure) (HCC)   Polycystic kidney disease   Stage 4 chronic kidney disease (HCC)   LOS: 1 day   A & P  Stage 4 chronic kidney disease ([place]) Sounds like this has progressed to CKD 5 recently, looks like his nephrologist Dr. Candiss Norse recently referred him to vasc surg (whom he saw just 6 days  ago) for evaluation for dialysis access placement due to deteriorating kidney function. Despite his age, pt pretty independent and high functioning it sounds like: pt confirms he would want dialysis if needed to save his life. Cont bicarb Checking urine culture given the large number of leukocytes in urine, but pt not really having UTI symptoms, fever, etc. Albumin 1.7 today, question Nephrotic syndrome? has > 300 protein in urine... ill defer to nephrology wether a 24h urine protein would even be of any value at this stage though. Currently nephrology does not have  any short term plans for HD, although they plan to set him of for access as outpatient following this illness.  Volume overload Pt with volume overload in setting of CKD 5.  Creat 4.8 today up from 3.8 last month. Also CHF (see below). I suspect his volume issues are more nephrogenic at this point given very elevated creatinine (GFR 12 today down from 15 last month), and VERY poor UOP despite high lasix dose in ED (just 200cc UOP several hours after 162m total lasix dose in ED). Looks like his nephrologist, Dr. SCandiss Norse also suspected dialysis would be in his near future given that Dr. SCandiss Norsehad referred patient to (and patient saw) vascular surgery for fistula vs graft placement just a couple of days ago (see Dr. CClaretha Coopernote). Pt confirms he would want dialysis if necessary to save his life. Confirms still full code, full scope of treatment (see also discussion on 06/05/22) Nephrology has recommended continued attempts at aggressive diuresis.  Monitor volume status, electrolytes, and creatinine.  Acute on chronic combined systolic and diastolic CHF (congestive heart failure) (HCC) EF 30-35% in Dec.  Significant LV dyssynchrony present, also mod-severe MR, mod AR, mild AS. Sees cards in office. Cont Imdur Cont metoprolol Evidenced by pulmonary edema and pleural effusions as well as anasarca.  Nephrology's assistance is appreciated. They have recommended continued aggressive diuresis.  Polycystic kidney disease Noted. Nephrology has been consulted. Recommendations from Dr. PJoylene Grapesare to continue diuresis with 120 mg tid with the addition of metolazone if urinary output drops off.   Cardiorenal Syndrome: Likely cause of worsening renal status. Will order echocardiogram to re-evaluate EF.   DVT prophylaxis: Heparin 5000 units Jobos q8 hours. Code Status: Full Code Family Communication: None available Disposition Plan: tbd    Algis Lehenbauer, DO [facility] Direct contact: see  [email or web address]  7PM-7AM contact night coverage as above 07/24/2022, 1:46 PM  LOS: 0 days

## 2022-07-25 NOTE — Progress Notes (Signed)
Nephrology Follow-Up Consult note   Assessment/Recommendations: Rick [name] is a/an 89 y.o. male with a past medical history significant for CKD 4, CHF, severe MR, admitted for acute on chronic heart failure exacerbation.       Non-Oliguric AKI on CKD IV: BL Crt ~3.5. Crt 4.3 now slightly improved to 4.2. Possible cardiorenal syndrome -Continue with diuresis as below -Continue to monitor daily Cr, Dose meds for GFR -Monitor Daily I/Os, Daily weight  -Maintain MAP>65 for optimal renal perfusion.  -Avoid nephrotoxic medications including NSAIDs -Use synthetic opioids (Fentanyl/Dilaudid) if needed -Currently no indication for HD; has said he would want this in the future by trying to delay  Chronic heart failure exacerbation: Fair response to Lasix yesterday.  Continue with Lasix 120 mg 3 times daily.  Consider the addition of metolazone if needed.  Replacing potassium as needed.  Continue home beta-blocker and bidil  Iron deficiency anemia: Iron parameters checked earlier this month.  Unclear if he had IV iron yet.  Will repeat and if iron remains deficient will arrange for IV iron.  He is currently on oral iron  Hypothyroidism: continue home Synthroid  Hypoalbuminemia: Continue following albumin level and consider supplementation if needed  Hyponatremia: Likely related to fluid excess.  Will improve with diuresis  Dementia: Recommend delirium precautions    Recommendations conveyed to primary service.    [facility] 07/25/2022 9:10 AM  ___________________________________________________________  CC: acute heart failure exacerbation  Interval History/Subjective: Patient states he feels about the same today.  Feels like he urinated a lot yesterday.  Not sure if his edema is any better.   Medications:  Current Facility-Administered Medications  Medication Dose Route Frequency Provider Last Rate Last Admin   acetaminophen (TYLENOL) tablet  650 mg  650 mg Oral Q6H PRN Etta Quill, DO       Or   acetaminophen (TYLENOL) suppository 650 mg  650 mg Rectal Q6H PRN Etta Quill, DO       albuterol (PROVENTIL) (2.5 MG/3ML) 0.083% nebulizer solution 2.5 mg  2.5 mg Nebulization Q2H PRN Swayze, Ava, DO       ferrous sulfate tablet 325 mg  325 mg Oral Q breakfast Etta Quill, DO   325 mg at 07/25/22 0844   furosemide (LASIX) 120 mg in dextrose 5 % 50 mL IVPB  120 mg Intravenous TID Reesa Chew, MD 62 mL/hr at 07/25/22 0904 120 mg at 07/25/22 0904   heparin injection 5,000 Units  5,000 Units Subcutaneous Q8H Jennette Kettle M, DO   5,000 Units at 07/25/22 0515   ipratropium-albuterol (DUONEB) 0.5-2.5 (3) MG/3ML nebulizer solution 3 mL  3 mL Nebulization Q6H PRN Swayze, Ava, DO   3 mL at 07/25/22 0445   isosorbide-hydrALAZINE (BIDIL) 20-37.5 MG per tablet 1 tablet  1 tablet Oral BID Etta Quill, DO   1 tablet at 07/25/22 0845   latanoprost (XALATAN) 0.005 % ophthalmic solution 1 drop  1 drop Both Eyes QHS Jennette Kettle M, DO   1 drop at 07/24/22 2146   levothyroxine (SYNTHROID) tablet 50 mcg  50 mcg Oral Q0600 Etta Quill, DO   50 mcg at 07/25/22 0514   metoprolol succinate (TOPROL-XL) 24 hr tablet 12.5 mg  12.5 mg Oral Daily Jennette Kettle M, DO   12.5 mg at 07/25/22 0845   ondansetron (ZOFRAN) tablet 4 mg  4 mg Oral Q6H PRN Etta Quill, DO       Or  ondansetron (ZOFRAN) injection 4 mg  4 mg Intravenous Q6H PRN Etta Quill, DO       pantoprazole (PROTONIX) EC tablet 40 mg  40 mg Oral Daily Jennette Kettle M, DO   40 mg at 07/25/22 0845   polyethylene glycol (MIRALAX / GLYCOLAX) packet 17 g  17 g Oral Daily Swayze, Ava, DO   17 g at 07/25/22 0845   potassium chloride SA (KLOR-CON M) CR tablet 40 mEq  40 mEq Oral Q6H Reesa Chew, MD   40 mEq at 07/25/22 0845   tamsulosin (FLOMAX) capsule 0.4 mg  0.4 mg Oral Daily Jennette Kettle M, DO   0.4 mg at 07/25/22 0845   timolol (TIMOPTIC) 0.5 % ophthalmic  solution 1 drop  1 drop Both Eyes BID Etta Quill, DO   1 drop at 07/25/22 [id number]      Review of Systems: 10 systems reviewed and negative except per interval history/subjective  Physical Exam: Vitals:   07/25/22 0726 07/25/22 0845  BP: 138/74 131/72  Pulse:  79  Resp: 18   Temp:    SpO2: 97%    Total I/O In: 594 [P.O.:594] Out: -   Intake/Output Summary (Last 24 hours) at 07/25/2022 0910 Last data filed at 07/25/2022 0800 Gross per 24 hour  Intake 973.08 ml  Output 1150 ml  Net -176.92 ml   Constitutional: well-appearing, no acute distress ENMT: ears and nose without scars or lesions, MMM CV: normal rate, 2+ edema in ble up to the thighs Respiratory: clear to anteriorly, normal work of breathing Gastrointestinal: soft, non-tender, no palpable masses or hernias Skin: no visible lesions or rashes Psych: alert, judgement/insight reduced, appropriate mood and affect   Test Results I personally reviewed new and old clinical labs and radiology tests Lab Results  Component Value Date   NA 128 (L) 07/25/2022   K 3.3 (L) 07/25/2022   CL 92 (L) 07/25/2022   CO2 26 07/25/2022   BUN 32 (H) 07/25/2022   CREATININE 4.15 (H) 07/25/2022   GFR 19.64 (L) 12/31/2021   GLU 114 06/19/2022   CALCIUM 7.5 (L) 07/25/2022   ALBUMIN 1.7 (L) 07/23/2022   PHOS 2.4 (L) 06/01/2022    CBC Recent Labs  Lab 07/23/22 2050 07/23/22 2052 07/24/22 0550 07/25/22 0148  WBC 9.6  --  8.4 7.6  NEUTROABS 8.4*  --   --  6.5  HGB 8.8* 9.9* 8.9* 8.1*  HCT 27.0* 29.0* 27.8* 25.2*  MCV 87.4  --  88.3 84.8  PLT 282  --  261 266

## 2022-07-25 NOTE — Progress Notes (Signed)
CCMD called and reported patient had 15 runs of wide QRS. MD notified. Will continue to monitor.

## 2022-07-25 NOTE — Hospital Course (Addendum)
The patient is a 89 yr old man with stage IV CKD. He presents with anasarca and shortness of breath. He is found to have pulmonary edema, anasarca, and pleural effusions. He was given 120 mg Lasix in the ED with 200 cc output over 12 hours. Nephrology has been consulted and wishes to continue attempts to aggressively diurese. They state that the patient will need to be set up for HD access as outpatient. The patient states that he wishes to go on dialysis despite advice to the contrary.   Dr. Joylene Grapes has evaluated the patient. He recommends continued aggressive diuresis with 120 mg lasix tid. He recommends the addition of metolazone for decreased urinary output. Echocardiogram has been ordered to re-evaluate the patient's cardiac function. Most recent echo demonstrated an EF of 30% on 06/01/22.   02/22 volume status has improved, continue to have hyponatremia.  02.23 considered refractive to tolvaptan and now started on IV 3% saline, transferred to ICU (will stay in [facility] service).  02/24 Na up to 129, with improvement in mental status.  02/25 improved Na with hypertonic saline.

## 2022-07-25 NOTE — TOC Initial Note (Signed)
Transition of Care [facility]) - Initial/Assessment Note    Patient Details  Name: Rick [name] MRN: [id number] Date of Birth: 1929-04-30  Transition of Care [facility]) CM/SW Contact:    Zenon Mayo, RN Phone Number: 07/25/2022, 4:23 PM  Clinical Narrative:                 From home with wife, he has rollator, cane, and quad cane at home.  He has a PCP, wife or daughter takes him to doctor's apts.  He had [place] services with [facility] in the past for HHPT, and HHOT.  He also went to [facility] in the past. [facility] [address] [facility], wife cell phone is [phone].  Presents with CHFconts on iv lasix, TID, will benefit from pt/ot eval, notified MD.  Expected Discharge Plan: [place] Barriers to Discharge: Continued Medical Work up   Patient Goals and CMS Choice Patient states their goals for this hospitalization and ongoing recovery are:: return home   Choice offered to / list presented to : NA      Expected Discharge Plan and Services In-house Referral: NA Discharge Planning Services: CM Consult Post Acute Care Choice: NA Living arrangements for the past 2 months: Single Family Home                 DME Arranged: N/A DME Agency: NA       HH Arranged: NA          Prior Living Arrangements/Services Living arrangements for the past 2 months: Single Family Home Lives with:: Spouse Patient language and need for interpreter reviewed:: Yes Do you feel safe going back to the place where you live?: Yes      Need for Family Participation in Patient Care: Yes (Comment) Care giver support system in place?: Yes (comment) Current home services: DME (rollator, cane, quad cane,) Criminal Activity/Legal Involvement Pertinent to Current Situation/Hospitalization: No - Comment as needed  Activities of Daily Living      Permission Sought/Granted                  Emotional Assessment Appearance:: Appears stated  age Attitude/Demeanor/Rapport: Engaged Affect (typically observed): Appropriate Orientation: : Oriented to Self Alcohol / Substance Use: Not Applicable Psych Involvement: No (comment)  Admission diagnosis:  Volume overload [E87.70] Acute cystitis without hematuria [N30.00] Hypervolemia, unspecified hypervolemia type [E87.70] Right-sided congestive heart failure, unspecified HF chronicity ([place]) [I50.810] Patient Active Problem List   Diagnosis Date Noted   Volume overload 07/23/2022   DNR (do not resuscitate) discussion 06/05/2022   Acute combined systolic and diastolic heart failure ([place]) 06/02/2022   Hypoalbuminemia due to protein-calorie malnutrition ([place]) 06/01/2022   Acquired hypothyroidism 06/01/2022   BPH (benign prostatic hyperplasia) 06/01/2022   Hypokalemia 05/31/2022   Metacarpal bone fracture 11/06/2021   Multiple pelvic fractures ([place]) 10/25/2021   Multiple closed pelvic fractures without disruption of pelvic ring ([place]) 10/22/2021   Gastritis and gastroduodenitis    Gastric polyp    GI bleed 05/02/2021   Anemia due to stage 4 chronic kidney disease ([place]) 05/02/2021   Hyponatremia 05/02/2021   Acute on chronic heart failure with preserved ejection fraction (HFpEF) ([place]) 05/02/2021   Cystic lesion of abdominal viscera 12/11/2020   Severe mitral regurgitation 12/02/2020   Demand ischemia 12/02/2020   Aortic atherosclerosis ([place]) 12/01/2020   Hypertensive urgency 12/01/2020   Polycystic kidney disease 12/01/2020   AKI (acute kidney injury) ([place]) superimposed  on CKD stage IV 12/01/2020   Glaucoma 12/01/2020   Diarrhea 12/01/2020   Acute hypoxemic respiratory failure ([place]) 12/01/2020   Stage 4 chronic kidney disease ([place]) 12/01/2020   Acute on chronic combined systolic and diastolic CHF (congestive heart failure) ([place]) 11/30/2020   Shoulder pain 05/30/2020   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   Retinal hemorrhage, right eye 05/29/2020   Hypertensive  retinopathy, right eye 05/29/2020   Cough 10/25/2017   Health care maintenance 10/22/2016   Anemia, iron deficiency 04/11/2015   Advance care planning 01/16/2015   Benign paroxysmal positional vertigo 01/16/2015   History of prostate cancer 02/02/2014   Bilateral renal cysts 01/31/2014   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   Family history of malignant neoplasm of gastrointestinal tract 03/04/2012   Medicare annual wellness visit, subsequent 01/23/2012   VENTRAL HERNIA, ASYMPTOMATIC 03/09/2008   TOBACCO ABUSE, HX OF [id number]   HELICOBACTER PYLORI INFECTION 11/25/2006   HYPERCHOLESTEROLEMIA 11/25/2006   Essential hypertension 11/25/2006   GERD without esophagitis 11/25/2006   Hyperglycemia 11/25/2006   PCP:  Tonia Ghent, MD Pharmacy[facility]   [id number] [place] [id number]: [phone]: [phone]    Social Determinants of Health (SDOH) Social History: SDOH Screenings   Food Insecurity: No Food Insecurity (06/11/2022)  Housing: Low Risk  (06/11/2022)  Transportation Needs: No Transportation Needs (06/11/2022)  Alcohol Screen: Low Risk  (11/27/2021)  Depression (PHQ2-9): Low Risk  (11/27/2021)  Financial Resource Strain: Low Risk  (11/27/2021)  Physical Activity: Insufficiently Active (11/27/2021)  Social Connections: Socially Integrated (11/27/2021)  Stress: No Stress Concern Present (11/27/2021)  Tobacco Use: Medium Risk (07/16/2022)   SDOH Interventions:     Readmission Risk Interventions    07/25/2022    4:19 PM  Readmission Risk Prevention Plan  Transportation Screening Complete  PCP or Specialist Appt within 3-5 Days Complete  [place] or [place]  HRI or Home Care Consult comments need pt eval  Social Work Consult for [place]/Counseling Not Complete  SW consult not completed comments need pt eval  Palliative Care Screening Not  Applicable  Medication Review (Press photographer Complete

## 2022-07-26 DIAGNOSIS — E8779 Other fluid overload: Secondary | ICD-10-CM | POA: Diagnosis not present

## 2022-07-26 LAB — RENAL FUNCTION PANEL
Albumin: 2.2 g/dL — ABNORMAL LOW (ref 3.5–5.0)
Anion gap: 11 (ref 5–15)
BUN: 35 mg/dL — ABNORMAL HIGH (ref 8–23)
CO2: 25 mmol/L (ref 22–32)
Calcium: 7.6 mg/dL — ABNORMAL LOW (ref 8.9–10.3)
Chloride: 92 mmol/L — ABNORMAL LOW (ref 98–111)
Creatinine, Ser: 3.97 mg/dL — ABNORMAL HIGH (ref 0.61–1.24)
GFR, Estimated: 14 mL/min — ABNORMAL LOW (ref >60)
Glucose, Bld: 99 mg/dL (ref 70–99)
Phosphorus: 3.5 mg/dL (ref 2.5–4.6)
Potassium: 4.3 mmol/L (ref 3.5–5.1)
Sodium: 128 mmol/L — ABNORMAL LOW (ref 135–145)

## 2022-07-26 MED ORDER — METOLAZONE 5 MG PO TABS
5.0000 mg | ORAL_TABLET | Freq: Every day | ORAL | Status: DC
  Administered 2022-07-26: 5 mg via ORAL
  Filled 2022-07-26: qty 1

## 2022-07-26 MED ORDER — IPRATROPIUM-ALBUTEROL 0.5-2.5 (3) MG/3ML IN SOLN
3.0000 mL | Freq: Four times a day (QID) | RESPIRATORY_TRACT | Status: DC
  Administered 2022-07-26 – 2022-07-27 (×6): 3 mL via RESPIRATORY_TRACT
  Filled 2022-07-26 (×6): qty 3

## 2022-07-26 MED ORDER — FUROSEMIDE 10 MG/ML IJ SOLN
160.0000 mg | Freq: Three times a day (TID) | INTRAVENOUS | Status: DC
  Administered 2022-07-26 – 2022-07-27 (×3): 160 mg via INTRAVENOUS
  Filled 2022-07-26 (×2): qty 16
  Filled 2022-07-26: qty 10
  Filled 2022-07-26: qty 2
  Filled 2022-07-26: qty 16

## 2022-07-26 NOTE — Progress Notes (Signed)
Nephrology Follow-Up Consult note   Assessment/Recommendations: Rick [name] is a/an 89 y.o. male with a past medical history significant for CKD 4, CHF, severe MR, admitted for acute on chronic heart failure exacerbation.       Non-Oliguric AKI on CKD IV: BL Crt ~3.5. Crt 4.3 now slightly improved to 4. Possible cardiorenal syndrome -Continue with diuresis as below -Continue to monitor daily Cr, Dose meds for GFR -Monitor Daily I/Os, Daily weight  -Maintain MAP>65 for optimal renal perfusion.  -Avoid nephrotoxic medications including NSAIDs -Use synthetic opioids (Fentanyl/Dilaudid) if needed -Currently no indication for HD; has said he would want this in the future by trying to delay.  His candidacy is marginal at best  Chronic heart failure exacerbation: Response to diuretics thus far is moderate.  Increase Lasix 260 mg and add metolazone.  Replacing potassium as needed.  Continue home beta-blocker and bidil  Iron deficiency anemia: Ordered 2 doses of iron and also added ESA.  Hypothyroidism: continue home Synthroid  Hypoalbuminemia: Continue following albumin level and consider supplementation if needed  Hyponatremia: Likely related to fluid excess.  Will improve with diuresis  Dementia: Recommend delirium precautions    Recommendations conveyed to primary service.    [facility] 07/26/2022 8:34 AM  ___________________________________________________________  CC: acute heart failure exacerbation  Interval History/Subjective: Patient was some sundowning/delirium last night.  Otherwise no significant issues.  Creatinine stable.  Diuresis fair   Medications:  Current Facility-Administered Medications  Medication Dose Route Frequency Provider Last Rate Last Admin   acetaminophen (TYLENOL) tablet 650 mg  650 mg Oral Q6H PRN Etta Quill, DO       Or   acetaminophen (TYLENOL) suppository 650 mg  650 mg Rectal Q6H PRN Etta Quill, DO       albuterol (PROVENTIL) (2.5 MG/3ML) 0.083% nebulizer solution 2.5 mg  2.5 mg Nebulization Q2H PRN Swayze, Ava, DO   2.5 mg at 07/26/22 0442   Darbepoetin Alfa (ARANESP) injection 60 mcg  60 mcg Subcutaneous Q Sat-1800 Reesa Chew, MD       ferric gluconate (FERRLECIT) 250 mg in sodium chloride 0.9 % 250 mL IVPB  250 mg Intravenous Daily Reesa Chew, MD   Stopped at 07/25/22 1559   ferrous sulfate tablet 325 mg  325 mg Oral Q breakfast Etta Quill, DO   325 mg at 07/25/22 0844   furosemide (LASIX) 160 mg in dextrose 5 % 50 mL IVPB  160 mg Intravenous TID Reesa Chew, MD       heparin injection 5,000 Units  5,000 Units Subcutaneous Q8H Jennette Kettle M, DO   5,000 Units at 07/26/22 [id number]   ipratropium-albuterol (DUONEB) 0.5-2.5 (3) MG/3ML nebulizer solution 3 mL  3 mL Nebulization QID Patrecia Pour, MD       isosorbide-hydrALAZINE (BIDIL) 20-37.5 MG per tablet 1 tablet  1 tablet Oral BID Etta Quill, DO   1 tablet at 07/25/22 2228   latanoprost (XALATAN) 0.005 % ophthalmic solution 1 drop  1 drop Both Eyes QHS Jennette Kettle M, DO   1 drop at 07/25/22 2304   levothyroxine (SYNTHROID) tablet 50 mcg  50 mcg Oral Q0600 Etta Quill, DO   50 mcg at 07/26/22 [id number]   metolazone (ZAROXOLYN) tablet 5 mg  5 mg Oral Daily Reesa Chew, MD       metoprolol succinate (TOPROL-XL) 24 hr tablet 12.5 mg  12.5 mg Oral Daily Etta Quill, DO  12.5 mg at 07/25/22 0845   pantoprazole (PROTONIX) EC tablet 40 mg  40 mg Oral Daily Jennette Kettle M, DO   40 mg at 07/25/22 0845   polyethylene glycol (MIRALAX / GLYCOLAX) packet 17 g  17 g Oral Daily Swayze, Ava, DO   17 g at 07/25/22 0845   tamsulosin (FLOMAX) capsule 0.4 mg  0.4 mg Oral Daily Jennette Kettle M, DO   0.4 mg at 07/25/22 0845   timolol (TIMOPTIC) 0.5 % ophthalmic solution 1 drop  1 drop Both Eyes BID Etta Quill, DO   1 drop at 07/25/22 2230      Review of Systems: 10 systems reviewed and negative except  per interval history/subjective  Physical Exam: Vitals:   07/26/22 0415 07/26/22 0740  BP: 132/78   Pulse: 71   Resp: 20 18  Temp: (!) 97.3 F (36.3 C)   SpO2: 98%    No intake/output data recorded.  Intake/Output Summary (Last 24 hours) at 07/26/2022 0834 Last data filed at 07/26/2022 [id number] Gross per 24 hour  Intake 911.84 ml  Output 2350 ml  Net -1438.16 ml   Constitutional: well-appearing, no acute distress ENMT: ears and nose without scars or lesions, MMM CV: normal rate, 2+ edema in the bilateral lower extremities Respiratory: clear to anteriorly, normal work of breathing Gastrointestinal: soft, non-tender, no palpable masses or hernias Skin: no visible lesions or rashes Psych: alert, judgement/insight reduced, appropriate mood and affect   Test Results I personally reviewed new and old clinical labs and radiology tests Lab Results  Component Value Date   NA 128 (L) 07/26/2022   K 4.3 07/26/2022   CL 92 (L) 07/26/2022   CO2 25 07/26/2022   BUN 35 (H) 07/26/2022   CREATININE 3.97 (H) 07/26/2022   GFR 19.64 (L) 12/31/2021   GLU 114 06/19/2022   CALCIUM 7.6 (L) 07/26/2022   ALBUMIN 2.2 (L) 07/26/2022   PHOS 3.5 07/26/2022    CBC Recent Labs  Lab 07/23/22 2050 07/23/22 2052 07/24/22 0550 07/25/22 0148  WBC 9.6  --  8.4 7.6  NEUTROABS 8.4*  --   --  6.5  HGB 8.8* 9.9* 8.9* 8.1*  HCT 27.0* 29.0* 27.8* 25.2*  MCV 87.4  --  88.3 84.8  PLT 282  --  261 266

## 2022-07-26 NOTE — Progress Notes (Signed)
[facility] PROGRESS NOTE  Rick [name] (DOB: [birth year]) [id number] PCP: Tonia Ghent, MD  Brief Narrative: Rick [name] is a 89 y.o. male with a history of stage IV CKD, HFrEF, hypothyroidism who presented to the ED on 07/23/2022 with anasarca, shortness of breath with pleural effusions and pulmonary edema. He was admitted for acute on chronic HFrEF, though echo shows stable cardiac function. Nephrology has been consulted, guiding IV diuresis with albumin support and renal function has remained near its poor baseline. He remains overloaded and hypoxic.  Subjective: Confused (at baseline), but denies pain, amenable to getting up but had yet to get OOB since arriving. Denies shortness of breath but spouse and daughter at bedside report some phlegm.   Objective: BP 138/84   Pulse 69   Temp (!) 97.3 F (36.3 C) (Oral)   Resp 18   Ht 5' 8"$  (1.727 m)   Wt 76.6 kg   SpO2 98%   BMI 25.68 kg/m   Gen: Elderly male in no distress Pulm: Crackles at bases posteriorly, no wheezes. Nonlabored with 3L O2 at rest. Of note, with exam, his normal breathing becomes exaggerated with significant upper airway wheeze. This resolves when exam is over.  CV: RRR, no MRG. +pitting dependent edema extending to abdominal wall, +JVD GI: Soft, NT, ND, +BS  Neuro: Alert and interactive, disoriented. No new focal deficits. Ext: Warm, no deformities Skin: No rashes, lesions or ulcers on visualized skin   Assessment & Plan: Volume overload, acute on chronic HFrEF, AKI on stage IV CKD: Suspect element of cardiorenal syndrome.  - Augment diuresis per nephrology today with lasix 116m IV TID, add metolazone this AM.  - BP stable, still could consider albumin administration repeat.  - Monitor I/O (-1.75L / 24 hours), strict I/O (81 > 78 > 76kg), remains grossly overloaded without brisk diuresis.  - Not yet requiring HD, though I agree his candidacy is undetermined at this time.  - Continue GDMT with  metoprolol succinate, bidil. No ACE/ARB/ARNI or MRA w/CKD.  - Monitor metabolic profile daily. Pt having improvement in hypervolemic hypernatremia.  - Holding home bicarbonate since CO2 normal  Hypothyroidism:  - Continue synthroid.  - Last TSH in July 2023 was 6.25. Will recheck.  Anemia of CKD and iron deficiency: No current bleeding.  - Supp iron (IV while here, and orally) - ESA per nephrology  Dementia:  - Delirium precautions  Hypoalbuminemia - Monitor  Glaucoma: Quiescent - Continue gtts  GERD:  - Continue PPI  RPatrecia Pour MD [facility] [email or web address] 07/26/2022, 1:00 PM

## 2022-07-26 NOTE — Plan of Care (Signed)
  Problem: Education: Goal: Knowledge of General Education information will improve Description: Including pain rating scale, medication(s)/side effects and non-pharmacologic comfort measures Outcome: Progressing   Problem: Health Behavior/Discharge Planning: Goal: Ability to manage health-related needs will improve Outcome: Progressing   Problem: Clinical Measurements: Goal: Ability to maintain clinical measurements within normal limits will improve Outcome: Progressing Goal: Diagnostic test results will improve Outcome: Progressing Goal: Cardiovascular complication will be avoided Outcome: Progressing   Problem: Activity: Goal: Risk for activity intolerance will decrease Outcome: Progressing   Problem: Nutrition: Goal: Adequate nutrition will be maintained Outcome: Progressing   Problem: Coping: Goal: Level of anxiety will decrease Outcome: Progressing   Problem: Elimination: Goal: Will not experience complications related to urinary retention Outcome: Progressing   Problem: Pain Managment: Goal: General experience of comfort will improve Outcome: Progressing   Problem: Safety: Goal: Ability to remain free from injury will improve Outcome: Progressing   Problem: Skin Integrity: Goal: Risk for impaired skin integrity will decrease Outcome: Progressing   Problem: Activity: Goal: Capacity to carry out activities will improve Outcome: Progressing   Problem: Cardiac: Goal: Ability to achieve and maintain adequate cardiopulmonary perfusion will improve Outcome: Progressing   Problem: Clinical Measurements: Goal: Respiratory complications will improve Outcome: Not Progressing

## 2022-07-26 NOTE — Evaluation (Signed)
Occupational Therapy Evaluation Patient Details Name: Rick [name] MRN: [id number] DOB: [birth year] Today's Date: 07/26/2022   History of Present Illness Rick [name] is a 89 y.o. male admitted 07/23/22 with anascarca and SOB and dx with CHF exacerbation. He is found to have pulmonary edema and pleural effusions. PMH includes CKD 4, severe MR, prostate CA, pelvic fx   Clinical Impression   Pt is typically mod I for mobility with RW, gets assist from family recently for bathing/dressing. Today Pt presents very fatigued, deconditioned. Able to complete bed mobility with mod A, BSC transfer at mod A with RW, Pt dependent on external assist for balance, DOE 3/4. Grooming and other activities limited to seated position when he would normally do them in standing. OT will continue to follow acutely for energy conservation education, activity tolerance, adpative strategies and will require SNF post-acute to maximize safety and independence in ADL and functional transfers.      Recommendations for follow up therapy are one component of a multi-disciplinary discharge planning process, led by the attending physician.  Recommendations may be updated based on patient status, additional functional criteria and insurance authorization.   Follow Up Recommendations  Skilled nursing-short term rehab (<3 hours/day)     Assistance Recommended at Discharge Frequent or constant Supervision/Assistance  Patient can return home with the following A lot of help with walking and/or transfers;A lot of help with bathing/dressing/bathroom;Assist for transportation;Help with stairs or ramp for entrance    Functional Status Assessment  Patient has had a recent decline in their functional status and demonstrates the ability to make significant improvements in function in a reasonable and predictable amount of time.  Equipment Recommendations  Tub/shower bench    Recommendations for Other Services PT consult (mobility  specialists)     Precautions / Restrictions Precautions Precautions: Fall Precaution Comments: monitor sats Restrictions Weight Bearing Restrictions: No      Mobility Bed Mobility Overal bed mobility: Needs Assistance Bed Mobility: Supine to Sit, Sit to Supine     Supine to sit: Mod assist Sit to supine: Mod assist   General bed mobility comments: assist for trunk elevation, cues for using bed rails to assist, use of bed pad to assist hips EOB    Transfers Overall transfer level: Needs assistance Equipment used: Rolling walker (2 wheels) Transfers: Bed to chair/wheelchair/BSC       Step pivot transfers: Mod assist     General transfer comment: mod A for boost, cues for weight shift and pivot to [facility]      Balance Overall balance assessment: Needs assistance Sitting-balance support: Feet supported Sitting balance-Leahy Scale: Fair     Standing balance support: Bilateral upper extremity supported, During functional activity Standing balance-Leahy Scale: Poor Standing balance comment: dependent on extrernal support from therapist and RW                           ADL either performed or assessed with clinical judgement   ADL Overall ADL's : Needs assistance/impaired Eating/Feeding: Set up   Grooming: Wash/dry hands;Set up;Sitting Grooming Details (indicate cue type and reason): EOB after BSC transfer Upper Body Bathing: Moderate assistance   Lower Body Bathing: Maximal assistance   Upper Body Dressing : Maximal assistance   Lower Body Dressing: Maximal assistance Lower Body Dressing Details (indicate cue type and reason): donning socks Toilet Transfer: Moderate assistance;Stand-pivot;BSC/3in1;Rolling walker (2 wheels)   Toileting- Clothing Manipulation and Hygiene: Minimal assistance;Sitting/lateral lean  Functional mobility during ADLs: Moderate assistance;Rolling walker (2 wheels) (BSC transfer only)       Vision Baseline  Vision/History: 1 Wears glasses Ability to See in Adequate Light: 0 Adequate Patient Visual Report: No change from baseline       Perception     Praxis      Pertinent Vitals/Pain Pain Assessment Pain Assessment: Faces Faces Pain Scale: No hurt Pain Intervention(s): Monitored during session, Repositioned     Hand Dominance Right   Extremity/Trunk Assessment Upper Extremity Assessment Upper Extremity Assessment: Overall WFL for tasks assessed;Generalized weakness   Lower Extremity Assessment Lower Extremity Assessment: Defer to PT evaluation   Cervical / Trunk Assessment Cervical / Trunk Assessment: Kyphotic   Communication Communication Communication: HOH   Cognition Arousal/Alertness: Awake/alert Behavior During Therapy: WFL for tasks assessed/performed, Anxious Overall Cognitive Status: Impaired/Different from baseline Area of Impairment: Safety/judgement, Problem solving                         Safety/Judgement: Decreased awareness of safety   Problem Solving: Slow processing, Requires verbal cues General Comments: Pt is very cooperative, requires extra time and benefits from cues for sequencing and safety     General Comments       Exercises     Shoulder Instructions      Home Living Family/patient expects to be discharged to:: Private residence Living Arrangements: Spouse/significant other Available Help at Discharge: Family;Available 24 hours/day Type of Home: House Home Access: Stairs to enter [employer] of Steps: 2 Entrance Stairs-Rails: Right Home Layout: One level     Bathroom Shower/Tub: Teacher, early years/pre: Handicapped height     Home Equipment: Cane - single point;Rollator (4 wheels);Rolling Walker (2 wheels);Toilet riser;BSC/3in1   Additional Comments: using RW for most mobility per pt      Prior Functioning/Environment Prior Level of Function : Independent/Modified Independent              Mobility Comments: RW ADLs Comments: family involved and supportive to help his wife        OT Problem List: Decreased strength;Decreased activity tolerance;Impaired balance (sitting and/or standing);Decreased safety awareness;Cardiopulmonary status limiting activity      OT Treatment/Interventions: Self-care/ADL training;Energy conservation;DME and/or AE instruction;Therapeutic activities;Patient/family education;Balance training    OT Goals(Current goals can be found in the care plan section) Acute Rehab OT Goals Patient Stated Goal: breathe easier, move better OT Goal Formulation: With patient Time For Goal Achievement: 08/09/22 Potential to Achieve Goals: Good ADL Goals Pt Will Perform Grooming: with modified independence;sitting Pt Will Perform Upper Body Dressing: with supervision;sitting;with adaptive equipment Pt Will Perform Lower Body Dressing: with supervision;with adaptive equipment;sitting/lateral leans Pt Will Transfer to Toilet: with supervision;ambulating;regular height toilet Pt Will Perform Toileting - Clothing Manipulation and hygiene: sit to/from stand;with min guard assist Pt/caregiver will Perform Home Exercise Program: Both right and left upper extremity;With theraband;With written HEP provided Additional ADL Goal #1: Pt will verbalize at least 3 energy conservation strategies for ADL with no cues  OT Frequency: Min 2X/week    Co-evaluation              AM-PAC OT "6 Clicks" Daily Activity     Outcome Measure Help from another person eating meals?: None Help from another person taking care of personal grooming?: A Little Help from another person toileting, which includes using toliet, bedpan, or urinal?: A Lot Help from another person bathing (including washing, rinsing, drying)?: A Lot Help from  another person to put on and taking off regular upper body clothing?: A Lot Help from another person to put on and taking off regular lower body clothing?: A  Little 6 Click Score: 16   End of Session Equipment Utilized During Treatment: Gait belt;Rolling walker (2 wheels);Oxygen (2L) Nurse Communication: Mobility status;Precautions  Activity Tolerance: Patient limited by fatigue Patient left: in bed;with call bell/phone within reach;with bed alarm set;with family/visitor present  OT Visit Diagnosis: Unsteadiness on feet (R26.81);Other abnormalities of gait and mobility (R26.89);Muscle weakness (generalized) (M62.81);History of falling (Z91.81);Other symptoms and signs involving cognitive function                Time: [id number] OT Time Calculation (min): 23 min Charges:  OT General Charges $OT Visit: 1 Visit OT Evaluation $OT Eval Moderate Complexity: [place] OTR/L Acute Rehabilitation Services Office: [place] 07/26/2022, 5:38 PM

## 2022-07-26 NOTE — Progress Notes (Signed)
Physical Therapy Evaluation Patient Details Name: Rick [name] MRN: [id number] DOB: 12/28/1928 Today's Date: 07/26/2022  History of Present Illness  Rick [name] is a 89 y.o. male admitted 07/23/22 with anascarca and SOB and dx with CHF exacerbation. He is found to have pulmonary edema and pleural effusions. PMH includes CKD 4, severe MR, prostate CA, pelvic fx  Clinical Impression  Pt was seen for progression of mobility on RW with help to use walker safely and to navigate obstacles.  Pt was satting in low 90's at baseline, dropped to 86% at lowest on room air.  Followed up with nursing and MD to inform of these findings.  Recommend him to go to rehab for now as he was completely fatigued and unable to do much more after walk, and will need to manage his need for sat monitoring for now.  Follow acutely for goals as are outlined below and focus on endurance and safety awareness to increase activity in a safe way that does not compromise recovery.       Recommendations for follow up therapy are one component of a multi-disciplinary discharge planning process, led by the attending physician.  Recommendations may be updated based on patient status, additional functional criteria and insurance authorization.  Follow Up Recommendations Skilled nursing-short term rehab (<3 hours/day) Can patient physically be transported by private vehicle: No    Assistance Recommended at Discharge Frequent or constant Supervision/Assistance  Patient can return home with the following  A lot of help with walking and/or transfers;A lot of help with bathing/dressing/bathroom;Assist for transportation;Help with stairs or ramp for entrance    Equipment Recommendations None recommended by PT  Recommendations for Other Services       Functional Status Assessment Patient has had a recent decline in their functional status and demonstrates the ability to make significant improvements in function in a reasonable and  predictable amount of time.     Precautions / Restrictions Precautions Precautions: Fall Precaution Comments: monitor sats Restrictions Weight Bearing Restrictions: No      Mobility  Bed Mobility Overal bed mobility: Needs Assistance Bed Mobility: Supine to Sit, Sit to Supine     Supine to sit: Mod assist Sit to supine: Mod assist   General bed mobility comments: pt got into bed on L side and declined to even move to his back, tired from effort to walk    Transfers Overall transfer level: Needs assistance Equipment used: Rolling walker (2 wheels), 1 person hand held assist Transfers: Sit to/from Stand Sit to Stand: Mod assist, Max assist           General transfer comment: mod initially but as he tired from [facility] trip, became max assist    Ambulation/Gait Ambulation/Gait assistance: Min assist (with a lot of cues) Gait Distance (Feet): 25 Feet Assistive device: Rolling walker (2 wheels), 1 person hand held assist Gait Pattern/deviations: Step-through pattern, Step-to pattern, Decreased stride length, Wide base of support, Trunk flexed Gait velocity: reduced Gait velocity interpretation: <1.31 ft/sec, indicative of household ambulator Pre-gait activities: standing sat and balance ck General Gait Details: tends to push walker out too far and get out of space, rushing due to his fatigue  Stairs            Wheelchair Mobility    Modified Rankin (Stroke Patients Only)       Balance Overall balance assessment: Needs assistance Sitting-balance support: Feet supported Sitting balance-Leahy Scale: Fair     Standing balance support: Bilateral upper extremity  supported, During functional activity Standing balance-Leahy Scale: Poor Standing balance comment: heavy walker and PT support to get balance                             Pertinent Vitals/Pain Pain Assessment Pain Assessment: Faces Faces Pain Scale: No hurt    Home Living Family/patient  expects to be discharged to:: Private residence Living Arrangements: Spouse/significant other Available Help at Discharge: Family;Available 24 hours/day Type of Home: House Home Access: Stairs to enter Entrance Stairs-Rails: Right Entrance Stairs-Number of Steps: 2   Home Layout: One level Home Equipment: Cane - single point;Rollator (4 wheels);Rolling Walker (2 wheels);Toilet riser;BSC/3in1 Additional Comments: using RW for most mobility per pt    Prior Function Prior Level of Function : Independent/Modified Independent             Mobility Comments: RW for gait ADLs Comments: family involved and supportive to help his wife     Hand Dominance   Dominant Hand: Right    Extremity/Trunk Assessment   Upper Extremity Assessment Upper Extremity Assessment: Overall WFL for tasks assessed    Lower Extremity Assessment Lower Extremity Assessment: Generalized weakness    Cervical / Trunk Assessment Cervical / Trunk Assessment: Kyphotic  Communication   Communication: HOH  Cognition Arousal/Alertness: Awake/alert Behavior During Therapy: WFL for tasks assessed/performed, Anxious Overall Cognitive Status: Difficult to assess                                 General Comments: pt is taking some time to manage safety but family not reacting, may be baseline        General Comments General comments (skin integrity, edema, etc.): Pt was motivated to try to move but is truly weak and struggling to get up and walk, exhausted by the effort and sats were low    Exercises     Assessment/Plan    PT Assessment Patient needs continued PT services  PT Problem List Decreased strength;Decreased range of motion;Decreased activity tolerance;Decreased balance;Decreased mobility;Decreased coordination;Decreased knowledge of use of DME;Decreased safety awareness;Cardiopulmonary status limiting activity       PT Treatment Interventions DME instruction;Gait  training;Functional mobility training;Therapeutic activities;Therapeutic exercise;Balance training;Neuromuscular re-education;Patient/family education;Stair training    PT Goals (Current goals can be found in the Care Plan section)  Acute Rehab PT Goals Patient Stated Goal: to get walking again PT Goal Formulation: With patient/family Time For Goal Achievement: 08/09/22 Potential to Achieve Goals: Good    Frequency Min 3X/week     Co-evaluation               AM-PAC PT "6 Clicks" Mobility  Outcome Measure Help needed turning from your back to your side while in a flat bed without using bedrails?: A Lot Help needed moving from lying on your back to sitting on the side of a flat bed without using bedrails?: A Lot Help needed moving to and from a bed to a chair (including a wheelchair)?: A Lot Help needed standing up from a chair using your arms (e.g., wheelchair or bedside chair)?: A Lot Help needed to walk in hospital room?: A Little Help needed climbing 3-5 steps with a railing? : Total 6 Click Score: 12    End of Session Equipment Utilized During Treatment: Gait belt Activity Tolerance: Treatment limited secondary to medical complications (Comment) (O2 sats are low) Patient left: in bed;with call bell/phone  within reach;with bed alarm set;with family/visitor present Nurse Communication: Mobility status PT Visit Diagnosis: Unsteadiness on feet (R26.81);Muscle weakness (generalized) (M62.81);Difficulty in walking, not elsewhere classified (R26.2)    Time: [id number] PT Time Calculation (min) (ACUTE ONLY): 38 min   Charges:   PT Evaluation $PT Eval Moderate Complexity: 1 Mod PT Treatments $Gait Training: 8-22 mins $Therapeutic Activity: 8-22 mins       Ramond Dial 07/26/2022, 12:51 PM  Mee Hives, PT PhD Acute Rehab Dept. Number: [place]

## 2022-07-27 DIAGNOSIS — E8779 Other fluid overload: Secondary | ICD-10-CM | POA: Diagnosis not present

## 2022-07-27 DIAGNOSIS — N184 Chronic kidney disease, stage 4 (severe): Secondary | ICD-10-CM | POA: Diagnosis not present

## 2022-07-27 LAB — RENAL FUNCTION PANEL
Albumin: 1.9 g/dL — ABNORMAL LOW (ref 3.5–5.0)
Anion gap: 10 (ref 5–15)
BUN: 32 mg/dL — ABNORMAL HIGH (ref 8–23)
CO2: 24 mmol/L (ref 22–32)
Calcium: 7.6 mg/dL — ABNORMAL LOW (ref 8.9–10.3)
Chloride: 93 mmol/L — ABNORMAL LOW (ref 98–111)
Creatinine, Ser: 4.07 mg/dL — ABNORMAL HIGH (ref 0.61–1.24)
GFR, Estimated: 13 mL/min — ABNORMAL LOW (ref >60)
Glucose, Bld: 102 mg/dL — ABNORMAL HIGH (ref 70–99)
Phosphorus: 3 mg/dL (ref 2.5–4.6)
Potassium: 3.9 mmol/L (ref 3.5–5.1)
Sodium: 127 mmol/L — ABNORMAL LOW (ref 135–145)

## 2022-07-27 LAB — TYPE AND SCREEN
ABO/RH(D): B POS
Antibody Screen: NEGATIVE

## 2022-07-27 LAB — CBC
HCT: 22.8 % — ABNORMAL LOW (ref 39.0–52.0)
Hemoglobin: 7.4 g/dL — ABNORMAL LOW (ref 13.0–17.0)
MCH: 28.1 pg (ref 26.0–34.0)
MCHC: 32.5 g/dL (ref 30.0–36.0)
MCV: 86.7 fL (ref 80.0–100.0)
Platelets: 219 10*3/uL (ref 150–400)
RBC: 2.63 MIL/uL — ABNORMAL LOW (ref 4.22–5.81)
RDW: 17.1 % — ABNORMAL HIGH (ref 11.5–15.5)
WBC: 9.7 10*3/uL (ref 4.0–10.5)
nRBC: 0 % (ref 0.0–0.2)

## 2022-07-27 LAB — TSH: TSH: 9.695 u[IU]/mL — ABNORMAL HIGH (ref 0.350–4.500)

## 2022-07-27 MED ORDER — IPRATROPIUM-ALBUTEROL 0.5-2.5 (3) MG/3ML IN SOLN: 3.0000 mL | Freq: Two times a day (BID) | RESPIRATORY_TRACT | Status: DC

## 2022-07-27 MED ORDER — FUROSEMIDE 10 MG/ML IJ SOLN
160.0000 mg | Freq: Three times a day (TID) | INTRAVENOUS | Status: DC
  Filled 2022-07-27 (×3): qty 16

## 2022-07-27 MED ORDER — FUROSEMIDE 10 MG/ML IJ SOLN
160.0000 mg | Freq: Three times a day (TID) | INTRAVENOUS | Status: DC
  Administered 2022-07-27 – 2022-07-28 (×4): 160 mg via INTRAVENOUS
  Filled 2022-07-27: qty 10
  Filled 2022-07-27: qty 16
  Filled 2022-07-27: qty 10
  Filled 2022-07-27 (×2): qty 16

## 2022-07-27 MED ORDER — METOLAZONE 5 MG PO TABS
5.0000 mg | ORAL_TABLET | Freq: Every day | ORAL | Status: DC
  Administered 2022-07-27 – 2022-07-29 (×3): 5 mg via ORAL
  Filled 2022-07-27 (×3): qty 1

## 2022-07-27 NOTE — TOC Progression Note (Signed)
Transition of Care [facility]) - Progression Note    Patient Details  Name: Rick [name] MRN: [id number] Date of Birth: March 28, 1929  Transition of Care [facility]) CM/SW [place], LCSW Phone Number: 07/27/2022, 2:22 PM  Clinical Narrative:     Due to pt's fluctuating orientation, CSW spoke with pt's spouse- Rick [name] in regards to SNF recommendation.  Rick [name] asked if pt would be staying at the hospital for rehab and CSW explained that he is being recommended to go to [facility] facility. Rick [name] explained that pt lives with her and their 2 sons and would rather him come home then go to a facility. She explains that pt has DME at home and has used [facility] in the past and agreeable to [facility]. CSW verified home address on face sheet. CSW informed Rick [name] the plan to bring pt home.  TOC team will continue to assist with discharge planning needs.   Expected Discharge Plan: [place] Barriers to Discharge: Continued Medical Work up  Expected Discharge Plan and Services In-house Referral: NA Discharge Planning Services: CM Consult Post Acute Care Choice: NA Living arrangements for the past 2 months: Single Family Home                 DME Arranged: N/A DME Agency: NA       HH Arranged: NA           Social Determinants of Health (SDOH) Interventions SDOH Screenings   Food Insecurity: No Food Insecurity (06/11/2022)  Housing: Low Risk  (06/11/2022)  Transportation Needs: No Transportation Needs (06/11/2022)  Alcohol Screen: Low Risk  (11/27/2021)  Depression (PHQ2-9): Low Risk  (11/27/2021)  Financial Resource Strain: Low Risk  (11/27/2021)  Physical Activity: Insufficiently Active (11/27/2021)  Social Connections: Socially Integrated (11/27/2021)  Stress: No Stress Concern Present (11/27/2021)  Tobacco Use: Medium Risk (07/16/2022)    Readmission Risk Interventions   Row Labels 07/25/2022    4:19 PM  Readmission Risk Prevention Plan   Section Header. No data exists in this row.    Transportation Screening   Complete  PCP or Specialist Appt within 3-5 Days   Complete  HRI or [place]   Not Complete  HRI or Home Care Consult comments   need pt eval  Social Work Consult for [place] Planning/Counseling   Not Complete  SW consult not completed comments   need pt eval  Palliative Care Screening   Not Applicable  Medication Review Press photographer)   Complete

## 2022-07-27 NOTE — Progress Notes (Signed)
[facility] PROGRESS NOTE  Rick [name] (DOB: 07-13-1928) [id number] PCP: Tonia Ghent, MD  Brief Narrative: Rick [name] is a 89 y.o. male with a history of stage IV CKD, HFrEF, hypothyroidism who presented to the ED on 07/23/2022 with anasarca, shortness of breath with pleural effusions and pulmonary edema. He was admitted for acute on chronic HFrEF, though echo shows stable cardiac function. Nephrology has been consulted, guiding IV diuresis with albumin support and renal function has remained near its poor baseline. He remains overloaded and hypoxic.  Subjective: Slept well, worked with therapy yesterday, denies dyspnea or pain anywhere at this time. Spouse denies any bleeding. He's never had transfusion but would consent if felt necessary.  Objective: BP 134/82 (BP Location: Right Arm)   Pulse 70   Temp (!) 97.4 F (36.3 C) (Axillary)   Resp 20   Ht 5' 8"$  (1.727 m)   Wt 76.7 kg   SpO2 98%   BMI 25.71 kg/m   Gen: No distress Pulm: Crackles laterally improving, no wheezes when auscultating at rest  CV: RRR  GI: Soft, NT, ND, +BS  Neuro: Alert disoriented. No new focal deficits. Ext: Warm, no deformities Skin: No new rashes, lesions or ulcers on visualized skin   Assessment & Plan: Volume overload, acute on chronic HFrEF, AKI on stage IV CKD: Suspect element of cardiorenal syndrome.  - Diuresis better with augmented regimen with stable Cr. Continue lasix 162m IV TID, and metolazone per nephrology.   - Monitor I/O (2.8L / 24 hours), strict I/O (81 > 78 >> 76kg)  - Not yet requiring HD, though I agree his candidacy is undetermined at this time.  - Continue GDMT with metoprolol succinate, bidil. No ACE/ARB/ARNI or MRA w/CKD.  - Monitor metabolic profile daily, monitoring hypervolemic hyponatremia. - Holding home bicarbonate since CO2 normal  Acute hypoxic respiratory failure: due to pulmonary edema as above - Wean O2 as diuresis progresses.  Hypothyroidism:   - Continue synthroid.  - Last TSH in July 2023 was 6.25. Will recheck.  Anemia of CKD and iron deficiency: No current bleeding.  - Supp iron (IV while here, and orally) - ESA per nephrology - Hgb down to 7.4g/dl, suspect transfusion may help with intravascular volume while aggressively diuresing, will check T&S to prepare for the possibility of transfusion. Pt's spouse wishes to defer unless hgb 7 or lower or vital signs change, but has no religious or other objections.   Dementia:  - Delirium precautions  Hypoalbuminemia - Monitor  Glaucoma: Quiescent - Continue gtts  GERD:  - Continue PPI  Deconditioning: Requiring much more assistance than his baseline at this time, SNF is recommended.  - Will continue mobilizing as much as possible.  RPatrecia Pour MD [facility] [email or web address] 07/27/2022, 11:03 AM

## 2022-07-27 NOTE — Progress Notes (Signed)
Nephrology Follow-Up Consult note   Assessment/Recommendations: Rick [name] is a/an 89 y.o. male with a past medical history significant for CKD 4, CHF, severe MR, admitted for acute on chronic heart failure exacerbation.       Non-Oliguric AKI on CKD IV: BL Crt ~3.5. Crt 4.3 now slightly improved to 4. Possible cardiorenal syndrome -Continue with diuresis as below; tolerating well so far -Continue to monitor daily Cr, Dose meds for GFR -Monitor Daily I/Os, Daily weight  -Maintain MAP>65 for optimal renal perfusion.  -Avoid nephrotoxic medications including NSAIDs -Use synthetic opioids (Fentanyl/Dilaudid) if needed -Currently no indication for HD; has said he would want this in the future by trying to delay.  His candidacy is marginal at best  Chronic heart failure exacerbation: Better response to diuretics yesterday after addition of metolazone.  Repeat Lasix 160 mg 3 times daily as well as metolazone 5 mg.  Continue home beta-blocker and BiDil  Iron deficiency/CKD anemia: IV iron provided as well as ESA  Hypothyroidism: continue home Synthroid  Hypoalbuminemia: Continue following albumin level and consider supplementation if needed  Hyponatremia: Likely related to fluid excess.  Will improve with diuresis  Dementia: Recommend delirium precautions    Recommendations conveyed to primary service.    [facility] 07/27/2022 8:21 AM  ___________________________________________________________  CC: acute heart failure exacerbation  Interval History/Subjective: Patient resting with no complaints.  Wife states that the patient slept much better last night.  Good urine output yesterday with 4 L made.  Creatinine stable   Medications:  Current Facility-Administered Medications  Medication Dose Route Frequency Provider Last Rate Last Admin   acetaminophen (TYLENOL) tablet 650 mg  650 mg Oral Q6H PRN Etta Quill, DO       Or    acetaminophen (TYLENOL) suppository 650 mg  650 mg Rectal Q6H PRN Etta Quill, DO       albuterol (PROVENTIL) (2.5 MG/3ML) 0.083% nebulizer solution 2.5 mg  2.5 mg Nebulization Q2H PRN Swayze, Ava, DO   2.5 mg at 07/26/22 0442   Darbepoetin Alfa (ARANESP) injection 60 mcg  60 mcg Subcutaneous Q Sat-1800 Reesa Chew, MD   60 mcg at 07/26/22 1701   ferrous sulfate tablet 325 mg  325 mg Oral Q breakfast Etta Quill, DO   325 mg at 07/26/22 1018   furosemide (LASIX) 160 mg in dextrose 5 % 50 mL IVPB  160 mg Intravenous TID Reesa Chew, MD       heparin injection 5,000 Units  5,000 Units Subcutaneous Q8H Jennette Kettle M, DO   5,000 Units at 07/27/22 [id number]   ipratropium-albuterol (DUONEB) 0.5-2.5 (3) MG/3ML nebulizer solution 3 mL  3 mL Nebulization QID Vance Gather B, MD   3 mL at 07/26/22 2020   isosorbide-hydrALAZINE (BIDIL) 20-37.5 MG per tablet 1 tablet  1 tablet Oral BID Etta Quill, DO   1 tablet at 07/26/22 2203   latanoprost (XALATAN) 0.005 % ophthalmic solution 1 drop  1 drop Both Eyes QHS Jennette Kettle M, DO   1 drop at 07/26/22 2203   levothyroxine (SYNTHROID) tablet 50 mcg  50 mcg Oral Q0600 Etta Quill, DO   50 mcg at 07/27/22 [id number]   metolazone (ZAROXOLYN) tablet 5 mg  5 mg Oral Daily Reesa Chew, MD       metoprolol succinate (TOPROL-XL) 24 hr tablet 12.5 mg  12.5 mg Oral Daily Jennette Kettle M, DO   12.5 mg at 07/26/22 1018  pantoprazole (PROTONIX) EC tablet 40 mg  40 mg Oral Daily Jennette Kettle M, DO   40 mg at 07/26/22 1018   polyethylene glycol (MIRALAX / GLYCOLAX) packet 17 g  17 g Oral Daily Swayze, Ava, DO   17 g at 07/26/22 1017   tamsulosin (FLOMAX) capsule 0.4 mg  0.4 mg Oral Daily Jennette Kettle M, DO   0.4 mg at 07/26/22 1018   timolol (TIMOPTIC) 0.5 % ophthalmic solution 1 drop  1 drop Both Eyes BID Etta Quill, DO   1 drop at 07/26/22 2203      Review of Systems: 10 systems reviewed and negative except per interval  history/subjective  Physical Exam: Vitals:   07/26/22 2200 07/27/22 0448  BP:  131/87  Pulse:  61  Resp:  18  Temp: (!) 97.4 F (36.3 C) (!) 97.4 F (36.3 C)  SpO2:  100%   No intake/output data recorded.  Intake/Output Summary (Last 24 hours) at 07/27/2022 [id number] Last data filed at 07/27/2022 [id number] Gross per 24 hour  Intake 1224 ml  Output 4525 ml  Net -3301 ml   Constitutional: well-appearing, no acute distress ENMT: ears and nose without scars or lesions, MMM CV: normal rate, 2+ edema in the bilateral lower extremities Respiratory: clear to anteriorly, normal work of breathing Gastrointestinal: soft, non-tender, no palpable masses or hernias Skin: no visible lesions or rashes Psych: alert, judgement/insight reduced, appropriate mood and affect   Test Results I personally reviewed new and old clinical labs and radiology tests Lab Results  Component Value Date   NA 127 (L) 07/27/2022   K 3.9 07/27/2022   CL 93 (L) 07/27/2022   CO2 24 07/27/2022   BUN 32 (H) 07/27/2022   CREATININE 4.07 (H) 07/27/2022   GFR 19.64 (L) 12/31/2021   GLU 114 06/19/2022   CALCIUM 7.6 (L) 07/27/2022   ALBUMIN 1.9 (L) 07/27/2022   PHOS 3.0 07/27/2022    CBC Recent Labs  Lab 07/23/22 2050 07/23/22 2052 07/24/22 0550 07/25/22 0148 07/27/22 0101  WBC 9.6  --  8.4 7.6 9.7  NEUTROABS 8.4*  --   --  6.5  --   HGB 8.8*   < > 8.9* 8.1* 7.4*  HCT 27.0*   < > 27.8* 25.2* 22.8*  MCV 87.4  --  88.3 84.8 86.7  PLT 282  --  261 266 219   < > = values in this interval not displayed.

## 2022-07-27 NOTE — Plan of Care (Signed)
  Problem: Education: Goal: Knowledge of General Education information will improve Description: Including pain rating scale, medication(s)/side effects and non-pharmacologic comfort measures Outcome: Progressing   Problem: Health Behavior/Discharge Planning: Goal: Ability to manage health-related needs will improve Outcome: Progressing   Problem: Clinical Measurements: Goal: Ability to maintain clinical measurements within normal limits will improve Outcome: Progressing Goal: Will remain free from infection Outcome: Progressing Goal: Diagnostic test results will improve Outcome: Progressing Goal: Respiratory complications will improve Outcome: Progressing Goal: Cardiovascular complication will be avoided Outcome: Progressing   Problem: Activity: Goal: Risk for activity intolerance will decrease Outcome: Progressing   Problem: Nutrition: Goal: Adequate nutrition will be maintained Outcome: Progressing   Problem: Coping: Goal: Level of anxiety will decrease Outcome: Progressing   Problem: Elimination: Goal: Will not experience complications related to bowel motility Outcome: Progressing Goal: Will not experience complications related to urinary retention Outcome: Progressing   Problem: Pain Managment: Goal: General experience of comfort will improve Outcome: Progressing   Problem: Safety: Goal: Ability to remain free from injury will improve Outcome: Progressing   Problem: Education: Goal: Ability to demonstrate management of disease process will improve Outcome: Progressing Goal: Ability to verbalize understanding of medication therapies will improve Outcome: Progressing

## 2022-07-28 DIAGNOSIS — E8779 Other fluid overload: Secondary | ICD-10-CM | POA: Diagnosis not present

## 2022-07-28 LAB — RENAL FUNCTION PANEL
Albumin: 1.8 g/dL — ABNORMAL LOW (ref 3.5–5.0)
Anion gap: 13 (ref 5–15)
BUN: 36 mg/dL — ABNORMAL HIGH (ref 8–23)
CO2: 27 mmol/L (ref 22–32)
Calcium: 7.7 mg/dL — ABNORMAL LOW (ref 8.9–10.3)
Chloride: 86 mmol/L — ABNORMAL LOW (ref 98–111)
Creatinine, Ser: 4.19 mg/dL — ABNORMAL HIGH (ref 0.61–1.24)
GFR, Estimated: 13 mL/min — ABNORMAL LOW (ref >60)
Glucose, Bld: 109 mg/dL — ABNORMAL HIGH (ref 70–99)
Phosphorus: 2.7 mg/dL (ref 2.5–4.6)
Potassium: 3.8 mmol/L (ref 3.5–5.1)
Sodium: 126 mmol/L — ABNORMAL LOW (ref 135–145)

## 2022-07-28 MED ORDER — FUROSEMIDE 10 MG/ML IJ SOLN
120.0000 mg | Freq: Two times a day (BID) | INTRAVENOUS | Status: DC
  Administered 2022-07-28 – 2022-07-29 (×2): 120 mg via INTRAVENOUS
  Filled 2022-07-28: qty 12
  Filled 2022-07-28: qty 2
  Filled 2022-07-28: qty 10

## 2022-07-28 NOTE — Progress Notes (Signed)
[facility] PROGRESS NOTE  YEHUDA OKUBO (DOB: [birth year]) [id number] PCP: Tonia Ghent, MD  Brief Narrative: Rick [name] is a 89 y.o. male with a history of stage IV CKD, HFrEF, hypothyroidism who presented to the ED on 07/23/2022 with anasarca, shortness of breath with pleural effusions and pulmonary edema. He was admitted for acute on chronic HFrEF, though echo shows stable cardiac function. Nephrology has been consulted, guiding IV diuresis with albumin support and renal function has remained near its poor baseline. He remains overloaded and hypoxic.  Subjective: Eating fine, breathing fine, has no complaints.  Objective: BP (!) 110/59 (BP Location: Right Arm)   Pulse 61   Temp (!) 97.5 F (36.4 C) (Oral)   Resp 20   Ht 5' 8"$  (1.727 m)   Wt 74 kg   SpO2 94%   BMI 24.81 kg/m   Gen: No distress, elderly, eating breakfast Pulm: Clear without wheezes, nonlabored  CV: RRR, edema remains extending legs to abdomen though decreased severity. GI: Soft, NT, ND, +BS  Neuro: Alert and oriented this morning. No new focal deficits. Ext: Warm, no deformities Skin: No rashes, lesions or ulcers on visualized skin   Assessment & Plan: Volume overload, acute on chronic HFrEF, AKI on stage IV CKD: Suspect element of cardiorenal syndrome.  - Diuresis better with augmented regimen with relatively stable Cr (4.15 > 3.97 > 4.07 > 4.19). Continue lasix, down to 164m IV BID, continue metolazone per nephrology.   - Monitor I/O (5.6L / 24 hours), strict I/O (81 >> 74kg)  - Not yet requiring HD. D/w Dr. FRoyce Macadamia Discussion around his candidacy includes his reasonable functional status. He has had delirium and does not have a diagnosis of dementia. He's had vascular surgery evaluation for dialysis access going forward. Hopefully will not need to address this acutely and can continue management with outpatient nephrology, Dr. SCandiss Norse - Continue GDMT with metoprolol succinate, bidil. No  ACE/ARB/ARNI or MRA w/CKD.  - Monitor metabolic profile daily, hyponatremia to 126. - Holding home bicarbonate since CO2 normal  Acute hypoxic respiratory failure: due to pulmonary edema as above - Wean O2 as diuresis progresses. Check pulse oximetry with ambulation, crackles significantly improved.  Hypothyroidism:  - Continue synthroid 581m - Last TSH in July 2023 was 6.25, it is 9.695 now. Will add on free T4, possibly need to increase dose  Anemia of CKD and iron deficiency: No current bleeding.  - Supp iron (IV while here, and orally) - ESA per nephrology - Hgb down to 7.4g/dl, suspect transfusion may help with intravascular volume while aggressively diuresing, will check T&S to prepare for the possibility of transfusion. Pt's spouse wishes to defer unless hgb 7 or lower or vital signs change, but has no religious or other objections.   Acute delirium: Patient's orientation was very abnormal at times during hospitalization, though he does NOT carry a diagnosis of dementia.  - Delirium precautions  Hypoalbuminemia - Monitor  Glaucoma: Quiescent - Continue gtts  GERD:  - Continue PPI  Deconditioning: Requiring much more assistance than his baseline at this time, SNF is recommended.  - Will continue mobilizing as much as possible.  RyPatrecia PourMD [facility] [email or web address] 07/28/2022, 12:57 PM

## 2022-07-28 NOTE — Progress Notes (Signed)
Nephrology Follow-Up Consult note   Assessment/Recommendations: Rick [name] is a/an 89 y.o. male with a past medical history significant for CKD 4, CHF, severe MR, admitted for acute on chronic heart failure exacerbation.     Non-Oliguric AKI on CKD IV: BL Crt ~3.5. Possible cardiorenal syndrome - optimize volume status.  He would need a graft for HD per Dr. Claretha Cooper note - If he does have dementia (he wasn't aware of the diagnosis and neither was his daughter) then his candidacy for dialysis is marginal.  Spoke with the team about the diagnosis and they are going to look into this   Chronic heart failure exacerbation:  - on Lasix 160 mg IV three times a day and metolazone 5 mg daily - Reduce lasix to 120 mg IV twice a day  Iron deficiency/CKD anemia: s/p IV iron and on ESA - aranesp 60 mcg every Saturday   Hypothyroidism: on Synthroid  Hypoalbuminemia: optimize nutrition per primary team - at this time doesn't have potassium or phosphorus restrictions  Hyponatremia: Likely related to fluid excess.  Adjust diuretics as above  Dementia: Recommend delirium precautions.  See this is a new charted diagnosis however patient and family (his daughter) are not aware of the diagnosis.  Appreciate staff and family.  I have reached out to his primary team about this diagnosis as well    Disposition - continue inpatient monitoring   ___________________________________________________________  CC: shortness of breath   Interval History/Subjective: he had 4.2 L UOP over 2/18 charted. He has been on lasix 160 mg IV three times a day and metolazone 5 mg daily here.  Per outpatient charting he has been seen by Dr. Candiss Norse and in the extender clinic and "wants to live".  Palliative care was consulted previously and he wished for full scope of care per charting.  The patient and his daughter are not aware of a diagnosis of dementia and his daughter thinks his memory is "pretty good for his age".  He's had  more issues with his health for the past few months she thinks and she does comment a little about his speech but comment is circumferential.  His wife was not reachable by home or cell phone.   Review of systems:  Reports shortness of breath is better Denies n/v  Making good urine output   Medications:  Current Facility-Administered Medications  Medication Dose Route Frequency Provider Last Rate Last Admin   acetaminophen (TYLENOL) tablet 650 mg  650 mg Oral Q6H PRN Etta Quill, DO   650 mg at 07/27/22 2130   Or   acetaminophen (TYLENOL) suppository 650 mg  650 mg Rectal Q6H PRN Etta Quill, DO       albuterol (PROVENTIL) (2.5 MG/3ML) 0.083% nebulizer solution 2.5 mg  2.5 mg Nebulization Q2H PRN Swayze, Ava, DO   2.5 mg at 07/26/22 0442   Darbepoetin Alfa (ARANESP) injection 60 mcg  60 mcg Subcutaneous Q Sat-1800 Reesa Chew, MD   60 mcg at 07/26/22 1701   ferrous sulfate tablet 325 mg  325 mg Oral Q breakfast Etta Quill, DO   325 mg at 07/28/22 0911   furosemide (LASIX) 160 mg in dextrose 5 % 50 mL IVPB  160 mg Intravenous TID Reesa Chew, MD 66 mL/hr at 07/28/22 0522 160 mg at 07/28/22 0522   heparin injection 5,000 Units  5,000 Units Subcutaneous Q8H Jennette Kettle M, DO   5,000 Units at 07/28/22 0522   isosorbide-hydrALAZINE (BIDIL) 20-37.5 MG  per tablet 1 tablet  1 tablet Oral BID Etta Quill, DO   1 tablet at 07/28/22 0910   latanoprost (XALATAN) 0.005 % ophthalmic solution 1 drop  1 drop Both Eyes QHS Etta Quill, DO   1 drop at 07/27/22 2130   levothyroxine (SYNTHROID) tablet 50 mcg  50 mcg Oral Q0600 Etta Quill, DO   50 mcg at 07/28/22 0522   metolazone (ZAROXOLYN) tablet 5 mg  5 mg Oral Daily Reesa Chew, MD   5 mg at 07/28/22 [id number]   metoprolol succinate (TOPROL-XL) 24 hr tablet 12.5 mg  12.5 mg Oral Daily Jennette Kettle M, DO   12.5 mg at 07/28/22 0910   pantoprazole (PROTONIX) EC tablet 40 mg  40 mg Oral Daily Jennette Kettle M, DO    40 mg at 07/28/22 0910   polyethylene glycol (MIRALAX / GLYCOLAX) packet 17 g  17 g Oral Daily Swayze, Ava, DO   17 g at 07/28/22 0911   tamsulosin (FLOMAX) capsule 0.4 mg  0.4 mg Oral Daily Jennette Kettle M, DO   0.4 mg at 07/28/22 0910   timolol (TIMOPTIC) 0.5 % ophthalmic solution 1 drop  1 drop Both Eyes BID Etta Quill, DO   1 drop at 07/27/22 2130      Physical Exam: Vitals:   07/28/22 0345 07/28/22 0910  BP: 125/71 (!) 140/85  Pulse: 68 74  Resp: 20   Temp: (!) 97.4 F (36.3 C)   SpO2: 99%    Total I/O In: 120 [P.O.:120] Out: -   Intake/Output Summary (Last 24 hours) at 07/28/2022 1042 Last data filed at 07/28/2022 0900 Gross per 24 hour  Intake 2655 ml  Output 3400 ml  Net -745 ml   General elderly male in bed in no acute distress HEENT normocephalic atraumatic extraocular movements intact sclera anicteric Neck supple trachea midline Lungs clear to auscultation bilaterally normal work of breathing at rest  Heart S1S2 no rub Abdomen soft nontender nondistended Extremities 1+ edema lower legs and 1+ edema in hips and abdominal wall  Psych normal mood and affect Neuro - alert and oriented to person, year, and location GU - Purewick in place   Test Results I personally reviewed new and old clinical labs and radiology tests Lab Results  Component Value Date   NA 126 (L) 07/28/2022   K 3.8 07/28/2022   CL 86 (L) 07/28/2022   CO2 27 07/28/2022   BUN 36 (H) 07/28/2022   CREATININE 4.19 (H) 07/28/2022   GFR 19.64 (L) 12/31/2021   GLU 114 06/19/2022   CALCIUM 7.7 (L) 07/28/2022   ALBUMIN 1.8 (L) 07/28/2022   PHOS 2.7 07/28/2022    CBC Recent Labs  Lab 07/23/22 2050 07/23/22 2052 07/24/22 0550 07/25/22 0148 07/27/22 0101  WBC 9.6  --  8.4 7.6 9.7  NEUTROABS 8.4*  --   --  6.5  --   HGB 8.8*   < > 8.9* 8.1* 7.4*  HCT 27.0*   < > 27.8* 25.2* 22.8*  MCV 87.4  --  88.3 84.8 86.7  PLT 282  --  261 266 219   < > = values in this interval not  displayed.   Claudia Desanctis, MD 11:22 AM 07/28/2022

## 2022-07-28 NOTE — Care Management Important Message (Signed)
Important Message  Patient Details  Name: Rick [name] MRN: [id number] Date of Birth: May 13, 1929   Medicare Important Message Given:  Yes     [name] 07/28/2022, 8:25 AM

## 2022-07-29 DIAGNOSIS — E8779 Other fluid overload: Secondary | ICD-10-CM | POA: Diagnosis not present

## 2022-07-29 DIAGNOSIS — N184 Chronic kidney disease, stage 4 (severe): Secondary | ICD-10-CM | POA: Diagnosis not present

## 2022-07-29 LAB — RENAL FUNCTION PANEL
Albumin: 1.7 g/dL — ABNORMAL LOW (ref 3.5–5.0)
Anion gap: 10 (ref 5–15)
BUN: 35 mg/dL — ABNORMAL HIGH (ref 8–23)
CO2: 30 mmol/L (ref 22–32)
Calcium: 7.7 mg/dL — ABNORMAL LOW (ref 8.9–10.3)
Chloride: 83 mmol/L — ABNORMAL LOW (ref 98–111)
Creatinine, Ser: 4.13 mg/dL — ABNORMAL HIGH (ref 0.61–1.24)
GFR, Estimated: 13 mL/min — ABNORMAL LOW (ref >60)
Glucose, Bld: 91 mg/dL (ref 70–99)
Phosphorus: 2.1 mg/dL — ABNORMAL LOW (ref 2.5–4.6)
Potassium: 3.5 mmol/L (ref 3.5–5.1)
Sodium: 123 mmol/L — ABNORMAL LOW (ref 135–145)

## 2022-07-29 LAB — CBC
HCT: 23.3 % — ABNORMAL LOW (ref 39.0–52.0)
Hemoglobin: 7.6 g/dL — ABNORMAL LOW (ref 13.0–17.0)
MCH: 27.9 pg (ref 26.0–34.0)
MCHC: 32.6 g/dL (ref 30.0–36.0)
MCV: 85.7 fL (ref 80.0–100.0)
Platelets: 195 10*3/uL (ref 150–400)
RBC: 2.72 MIL/uL — ABNORMAL LOW (ref 4.22–5.81)
RDW: 17.2 % — ABNORMAL HIGH (ref 11.5–15.5)
WBC: 8.6 10*3/uL (ref 4.0–10.5)
nRBC: 0 % (ref 0.0–0.2)

## 2022-07-29 LAB — SODIUM
Sodium: 124 mmol/L — ABNORMAL LOW (ref 135–145)
Sodium: 123 mmol/L — ABNORMAL LOW (ref 135–145)

## 2022-07-29 LAB — T4, FREE: Free T4: 0.64 ng/dL (ref 0.61–1.12)

## 2022-07-29 MED ORDER — TOLVAPTAN 15 MG PO TABS
7.5000 mg | ORAL_TABLET | Freq: Once | ORAL | Status: AC
  Administered 2022-07-29: 7.5 mg via ORAL
  Filled 2022-07-29: qty 1

## 2022-07-29 MED ORDER — POTASSIUM CHLORIDE CRYS ER 20 MEQ PO TBCR
20.0000 meq | EXTENDED_RELEASE_TABLET | Freq: Once | ORAL | Status: AC
  Administered 2022-07-29: 20 meq via ORAL
  Filled 2022-07-29: qty 1

## 2022-07-29 MED ORDER — DARBEPOETIN ALFA 100 MCG/0.5ML IJ SOSY
100.0000 ug | PREFILLED_SYRINGE | INTRAMUSCULAR | Status: DC
  Administered 2022-08-02: 100 ug via SUBCUTANEOUS
  Filled 2022-07-29: qty 0.5

## 2022-07-29 NOTE — Progress Notes (Signed)
Mobility Specialist - Progress Note   07/29/22 1132  Mobility  Activity Transferred from bed to chair  Level of Assistance Minimal assist, patient does 75% or more  Assistive Device Front wheel walker  Distance Ambulated (ft) 4 ft  Activity Response Tolerated well  Mobility Referral Yes  $Mobility charge 1 Mobility    Pt received in bed requesting to sit in recliner. MinA to stand and pivot, cues needed throughout. Left in recliner w/ chair alarm on and call bell in his lap.   [facility] Specialist Please contact via SecureChat or Rehab office at [phone]

## 2022-07-29 NOTE — Progress Notes (Signed)
Nephrology Follow-Up Consult note   Assessment/Recommendations: Rick [name] is a/an 89 y.o. male with a past medical history significant for CKD 4, CHF, severe MR, admitted for acute on chronic heart failure exacerbation.     Non-Oliguric AKI on CKD IV: BL Crt ~3.5. Possible cardiorenal syndrome - optimize volume status.  He would need a graft for HD per Dr. Claretha Cooper note - If he does have dementia (he wasn't aware of the diagnosis and neither was his daughter) then his candidacy for dialysis is marginal.  Spoke with the team about the diagnosis and they are going to look into this   Chronic heart failure exacerbation:  - Fluid status improved - Pause metolazone  - Rec'd Lasix 120 mg IV once this AM - anticipate resuming lasix tomorrow but pause with worsening hyponatremia and plans for tolvaptan - Give tolvaptan 7.5 mg once today   Iron deficiency/CKD anemia: s/p IV iron and on ESA - ordered for aranesp 60 mcg every Saturday and will increase to 100 mcg weekly on Saturdays  Hypothyroidism: on Synthroid  Hypoalbuminemia: optimize nutrition per primary team - at this time doesn't have potassium or phosphorus restrictions  Hyponatremia: Likely related to fluid excess.  Worsening.   - Will give tolvaptan 7.5 mg once today.   - Sodium level now and every 8 hours x 3.  Renal panel in AM.   - Please notify nephrology on call if evening or overnight sodium rises to 129 or above before 7 am on 07/30/22.  (Please call or page - please do not chat message).   Delirium:  Improved it seems.  Team has recommended delirium precautions.  Note that he does not carry a diagnosis of dementia - discussed with primary team and they have clarified charting  Disposition - continue inpatient monitoring   ___________________________________________________________  CC: shortness of breath   Interval History/Subjective: he had 3.7 L UOP over 2/19 charted.  Primary team has clarified in charting that he has  delirium rather than known dementia.  He feels ok this AM.   Review of systems:  He denies shortness of breath  Denies n/v  Making good urine output   Medications:  Current Facility-Administered Medications  Medication Dose Route Frequency Provider Last Rate Last Admin   acetaminophen (TYLENOL) tablet 650 mg  650 mg Oral Q6H PRN Etta Quill, DO   650 mg at 07/27/22 2130   Or   acetaminophen (TYLENOL) suppository 650 mg  650 mg Rectal Q6H PRN Etta Quill, DO       albuterol (PROVENTIL) (2.5 MG/3ML) 0.083% nebulizer solution 2.5 mg  2.5 mg Nebulization Q2H PRN Swayze, Ava, DO   2.5 mg at 07/26/22 0442   Darbepoetin Alfa (ARANESP) injection 60 mcg  60 mcg Subcutaneous Q Sat-1800 Reesa Chew, MD   60 mcg at 07/26/22 1701   ferrous sulfate tablet 325 mg  325 mg Oral Q breakfast Etta Quill, DO   325 mg at 07/29/22 [id number]   furosemide (LASIX) 120 mg in dextrose 5 % 50 mL IVPB  120 mg Intravenous Q12H Claudia Desanctis, MD 62 mL/hr at 07/29/22 0806 120 mg at 07/29/22 0806   heparin injection 5,000 Units  5,000 Units Subcutaneous Q8H Etta Quill, DO   5,000 Units at 07/29/22 0747   isosorbide-hydrALAZINE (BIDIL) 20-37.5 MG per tablet 1 tablet  1 tablet Oral BID Etta Quill, DO   1 tablet at 07/29/22 0803   latanoprost (XALATAN) 0.005 % ophthalmic solution  1 drop  1 drop Both Eyes QHS Jennette Kettle M, DO   1 drop at 07/28/22 2323   levothyroxine (SYNTHROID) tablet 50 mcg  50 mcg Oral Q0600 Etta Quill, DO   50 mcg at 07/29/22 [id number]   metolazone (ZAROXOLYN) tablet 5 mg  5 mg Oral Daily Reesa Chew, MD   5 mg at 07/29/22 [id number]   metoprolol succinate (TOPROL-XL) 24 hr tablet 12.5 mg  12.5 mg Oral Daily Jennette Kettle M, DO   12.5 mg at 07/29/22 0803   pantoprazole (PROTONIX) EC tablet 40 mg  40 mg Oral Daily Jennette Kettle M, DO   40 mg at 07/29/22 0803   polyethylene glycol (MIRALAX / GLYCOLAX) packet 17 g  17 g Oral Daily Swayze, Ava, DO   17 g at 07/29/22 0803    tamsulosin (FLOMAX) capsule 0.4 mg  0.4 mg Oral Daily Jennette Kettle M, DO   0.4 mg at 07/29/22 0803   timolol (TIMOPTIC) 0.5 % ophthalmic solution 1 drop  1 drop Both Eyes BID Etta Quill, DO   1 drop at 07/29/22 0803      Physical Exam: Vitals:   07/29/22 0046 07/29/22 0756  BP: 104/69 128/76  Pulse: 67 69  Resp:  16  Temp: 98 F (36.7 C) 98.2 F (36.8 C)  SpO2: 92% 93%   No intake/output data recorded.  Intake/Output Summary (Last 24 hours) at 07/29/2022 0902 Last data filed at 07/29/2022 0048 Gross per 24 hour  Intake 530 ml  Output 3700 ml  Net -3170 ml   General elderly male in bed in no acute distress  HEENT normocephalic atraumatic extraocular movements intact sclera anicteric Neck supple trachea midline Lungs clear to auscultation bilaterally normal work of breathing at rest  Heart S1S2 no rub Abdomen soft nontender nondistended Extremities 1+ edema lower legs and 1+ edema in hips and abdominal wall  Psych normal mood and affect Neuro - alert and oriented to person, year, and location; follows commands and provides history GU - Purewick in place   Test Results I personally reviewed new and old clinical labs and radiology tests Lab Results  Component Value Date   NA 123 (L) 07/29/2022   K 3.5 07/29/2022   CL 83 (L) 07/29/2022   CO2 30 07/29/2022   BUN 35 (H) 07/29/2022   CREATININE 4.13 (H) 07/29/2022   GFR 19.64 (L) 12/31/2021   GLU 114 06/19/2022   CALCIUM 7.7 (L) 07/29/2022   ALBUMIN 1.7 (L) 07/29/2022   PHOS 2.1 (L) 07/29/2022    CBC Recent Labs  Lab 07/23/22 2050 07/23/22 2052 07/25/22 0148 07/27/22 0101 07/29/22 0109  WBC 9.6   < > 7.6 9.7 8.6  NEUTROABS 8.4*  --  6.5  --   --   HGB 8.8*   < > 8.1* 7.4* 7.6*  HCT 27.0*   < > 25.2* 22.8* 23.3*  MCV 87.4   < > 84.8 86.7 85.7  PLT 282   < > 266 219 195   < > = values in this interval not displayed.   Claudia Desanctis, MD 9:29 AM 07/29/2022

## 2022-07-29 NOTE — Plan of Care (Signed)
  Problem: Education: Goal: Knowledge of General Education information will improve Description: Including pain rating scale, medication(s)/side effects and non-pharmacologic comfort measures Outcome: Progressing   Problem: Activity: Goal: Risk for activity intolerance will decrease Outcome: Progressing   Problem: Elimination: Goal: Will not experience complications related to urinary retention Outcome: Progressing   Problem: Pain Managment: Goal: General experience of comfort will improve Outcome: Progressing   Problem: Safety: Goal: Ability to remain free from injury will improve Outcome: Progressing

## 2022-07-29 NOTE — Progress Notes (Signed)
Physical Therapy Treatment Patient Details Name: Rick [name] MRN: [id number] DOB: 15-Mar-1929 Today's Date: 07/29/2022   History of Present Illness Rick [name] is a 89 y.o. male admitted 07/23/22 with anascarca and SOB and dx with CHF exacerbation. He is found to have pulmonary edema and pleural effusions. PMH includes CKD 4, severe MR, prostate CA, pelvic fx    PT Comments    Pt tolerated treatment well today. Pt was able to transfer from chair to bed however continues to be limited by decreased activity tolerance. Pt would benefit from further functional mobility and endurance training during acute stay. No change in DC/DME recs.   Recommendations for follow up therapy are one component of a multi-disciplinary discharge planning process, led by the attending physician.  Recommendations may be updated based on patient status, additional functional criteria and insurance authorization.  Follow Up Recommendations  Skilled nursing-short term rehab (<3 hours/day) Can patient physically be transported by private vehicle: No   Assistance Recommended at Discharge Frequent or constant Supervision/Assistance  Patient can return home with the following A lot of help with walking and/or transfers;A lot of help with bathing/dressing/bathroom;Assist for transportation;Help with stairs or ramp for entrance   Equipment Recommendations  None recommended by PT    Recommendations for Other Services       Precautions / Restrictions Precautions Precautions: Fall Precaution Comments: monitor sats Restrictions Weight Bearing Restrictions: No     Mobility  Bed Mobility Overal bed mobility: Needs Assistance Bed Mobility: Sit to Supine       Sit to supine: Mod assist   General bed mobility comments: Pt required assistance with BLE management and positioning in bed    Transfers Overall transfer level: Needs assistance Equipment used: Rolling walker (2 wheels) Transfers: Bed to  chair/wheelchair/BSC, Sit to/from Stand Sit to Stand: Min guard, Min assist Stand pivot transfers: Min assist              Ambulation/Gait                   Stairs             Wheelchair Mobility    Modified Rankin (Stroke Patients Only)       Balance Overall balance assessment: No apparent balance deficits (not formally assessed)                                          Cognition Arousal/Alertness: Awake/alert Behavior During Therapy: WFL for tasks assessed/performed Overall Cognitive Status: Within Functional Limits for tasks assessed                                          Exercises      General Comments        Pertinent Vitals/Pain Pain Assessment Pain Assessment: No/denies pain    Home Living                          Prior Function            PT Goals (current goals can now be found in the care plan section) Progress towards PT goals: Progressing toward goals    Frequency    Min 3X/week      PT Plan Current plan remains  appropriate    Co-evaluation              AM-PAC PT "6 Clicks" Mobility   Outcome Measure  Help needed turning from your back to your side while in a flat bed without using bedrails?: A Lot Help needed moving from lying on your back to sitting on the side of a flat bed without using bedrails?: A Lot Help needed moving to and from a bed to a chair (including a wheelchair)?: A Lot Help needed standing up from a chair using your arms (e.g., wheelchair or bedside chair)?: A Lot Help needed to walk in hospital room?: A Little Help needed climbing 3-5 steps with a railing? : Total 6 Click Score: 12    End of Session Equipment Utilized During Treatment: Gait belt Activity Tolerance: Patient tolerated treatment well Patient left: in bed;with call bell/phone within reach;with family/visitor present Nurse Communication: Mobility status PT Visit Diagnosis:  Unsteadiness on feet (R26.81);Muscle weakness (generalized) (M62.81);Difficulty in walking, not elsewhere classified (R26.2)     Time: [id number] PT Time Calculation (min) (ACUTE ONLY): 19 min  Charges:  $Therapeutic Activity: 8-22 mins                     Shelby Mattocks, PT, DPT Acute Rehab Services [id number]    Viann Shove 07/29/2022, 3:54 PM

## 2022-07-29 NOTE — Progress Notes (Signed)
[facility] PROGRESS NOTE  Rick [name] (DOB: 25-Oct-1928) [id number] PCP: Tonia Ghent, MD  Brief Narrative: Rick [name] is a 89 y.o. male with a history of stage IV CKD, HFrEF, hypothyroidism who presented to the ED on 07/23/2022 with anasarca, shortness of breath with pleural effusions and pulmonary edema. He was admitted for acute on chronic HFrEF, though echo shows stable cardiac function. Nephrology has been consulted, guiding IV diuresis with albumin support and renal function has remained near its poor baseline. He remains overloaded, though diuresis has been effective with stable renal function. He was hypoxemic though this is improving. He is deconditioned which may effect disposition.  Subjective: Feels a bit better, his breathing is "fine." Did not get out of bed at all yesterday he and his wife say. He had some hip pain while moving in bed overnight but none currently.  Objective: BP 128/76 (BP Location: Right Arm)   Pulse 69   Temp 98 F (36.7 C) (Oral)   Resp 16   Ht 5' 8"$  (1.727 m)   Wt 73.9 kg   SpO2 93%   BMI 24.77 kg/m   Gen: Pleasant elderly male in no distress Pulm: Clear, nonlabored at rest without O2 on.  CV: RRR, edema in legs significantly improved, seems to have resolved in abdominal wall. GI: Soft, NT, ND, +BS  Neuro: Alert, interactive. Slowed, HOH. No new focal deficits. Ext: Warm, no deformities. Skin: No rashes, lesions or ulcers on visualized skin   Assessment & Plan: Volume overload, acute on chronic HFrEF, AKI on stage IV CKD: Suspect element of cardiorenal syndrome.  - Diuresis better with augmented regimen with relatively stable Cr (4.15 >> 4.13). Continue lasix per nephrology, had good UOP with decreased lasix down to 13m IV BID, continue metolazone per nephrology.   - Monitor I/O (4.4L / 24 hours), strict I/O (81 >> 73.9kg)  - Not yet requiring HD. D/w Dr. FRoyce Macadamia Discussion around his candidacy includes his reasonable  functional status. He has had delirium and does not have a diagnosis of dementia. He's had vascular surgery evaluation for dialysis access going forward. Hopefully will not need to address this acutely and can continue management with outpatient nephrology, Dr. SCandiss Norse - Continue GDMT with metoprolol succinate, bidil. No ACE/ARB/ARNI or MRA w/CKD.  - Monitor metabolic profile daily, hyponatremia to 126. - Holding home bicarbonate since CO2 normal  Acute hypoxic respiratory failure: due to pulmonary edema as above - Wean O2 as diuresis progresses. Check pulse oximetry with ambulation today. Reordered.  Hypothyroidism:  - Continue synthroid 567m - Last TSH in July 2023 was 6.25, it is 9.695 now. Will add on free T4, possibly need to increase dose  Anemia of CKD and iron deficiency: No current bleeding.  - Supp iron (IV while here, and orally) - ESA per nephrology - Hgb down to 7.4g/dl but stable at 7.6g/dl without bleeding. Pt's spouse wishes to defer unless hgb 7 or lower or vital signs change, but has no religious or other objections.   Acute delirium: Patient's orientation was very abnormal at times during hospitalization, though he does NOT carry a diagnosis of dementia.  - Delirium precautions  Hypoalbuminemia - Monitor  Glaucoma: Quiescent - Continue gtts  GERD:  - Continue PPI  Deconditioning: Requiring much more assistance than his baseline at this time, SNF is recommended.  - Will continue mobilizing as much as possible. Emphasized this to patient and spouse this morning. He has orders to be OOB qShift and PT/OT  will continue working with him.  Patrecia Pour, MD [facility] [email or web address] 07/29/2022, 8:01 AM

## 2022-07-30 DIAGNOSIS — G9341 Metabolic encephalopathy: Secondary | ICD-10-CM

## 2022-07-30 DIAGNOSIS — E039 Hypothyroidism, unspecified: Secondary | ICD-10-CM | POA: Diagnosis present

## 2022-07-30 DIAGNOSIS — I10 Essential (primary) hypertension: Secondary | ICD-10-CM

## 2022-07-30 DIAGNOSIS — I5043 Acute on chronic combined systolic (congestive) and diastolic (congestive) heart failure: Secondary | ICD-10-CM | POA: Diagnosis not present

## 2022-07-30 DIAGNOSIS — N184 Chronic kidney disease, stage 4 (severe): Secondary | ICD-10-CM | POA: Diagnosis not present

## 2022-07-30 HISTORY — DX: Metabolic encephalopathy: G93.41

## 2022-07-30 LAB — RENAL FUNCTION PANEL
Albumin: 1.9 g/dL — ABNORMAL LOW (ref 3.5–5.0)
Anion gap: 14 (ref 5–15)
BUN: 38 mg/dL — ABNORMAL HIGH (ref 8–23)
CO2: 28 mmol/L (ref 22–32)
Calcium: 8.1 mg/dL — ABNORMAL LOW (ref 8.9–10.3)
Chloride: 83 mmol/L — ABNORMAL LOW (ref 98–111)
Creatinine, Ser: 4.28 mg/dL — ABNORMAL HIGH (ref 0.61–1.24)
GFR, Estimated: 12 mL/min — ABNORMAL LOW (ref >60)
Glucose, Bld: 112 mg/dL — ABNORMAL HIGH (ref 70–99)
Phosphorus: 2.3 mg/dL — ABNORMAL LOW (ref 2.5–4.6)
Potassium: 4 mmol/L (ref 3.5–5.1)
Sodium: 125 mmol/L — ABNORMAL LOW (ref 135–145)

## 2022-07-30 LAB — SODIUM, URINE, RANDOM: Sodium, Ur: 110 mmol/L

## 2022-07-30 LAB — OSMOLALITY: Osmolality: 278 mOsm/kg (ref 275–295)

## 2022-07-30 LAB — SODIUM: Sodium: 125 mmol/L — ABNORMAL LOW (ref 135–145)

## 2022-07-30 LAB — OSMOLALITY, URINE: Osmolality, Ur: 312 mOsm/kg (ref 300–900)

## 2022-07-30 MED ORDER — ALBUMIN HUMAN 25 % IV SOLN
25.0000 g | Freq: Once | INTRAVENOUS | Status: AC
  Administered 2022-07-30: 25 g via INTRAVENOUS
  Filled 2022-07-30: qty 100

## 2022-07-30 MED ORDER — FUROSEMIDE 10 MG/ML IJ SOLN
80.0000 mg | Freq: Once | INTRAMUSCULAR | Status: AC
  Administered 2022-07-30: 80 mg via INTRAVENOUS
  Filled 2022-07-30: qty 8

## 2022-07-30 NOTE — Assessment & Plan Note (Addendum)
Echocardiogram with reduced LV systolic function EF 30 to 35%, with global hypokinesis. Mild LVH, E-A fusion, RV systolic function preserved, LA with moderate dilatation. Small pericardial effusion. Moderate to severe mitral regurgitation, moderate aortic regurgitation.  Urine output is  [id number] cc Systolic blood pressure is 126 to 128  mmHg.   Patient had IV furosemide and  IV albumin. Continue bidil and metoprolol.  Tolvaptan x2 doses.  Resumed IV furosemide today due to sings of volume overload after hypertonic saline infusion.

## 2022-07-30 NOTE — Assessment & Plan Note (Addendum)
Polycystic renal disease.  Non oliguric acute renal failure. Hyponatremia   Improved volume status.  Continue to have hyponatremia despite fluid restriction and 2 doses of tolvaptan. Patient has been placed on hypertonic saline infusion and transferred to the ICU.   Continue q2 hr BMP per protocol. Patient will stay in [facility] service, case discussed with critical care.   Anemia of chronic renal disease.  Stable hgb, continue with EPO.

## 2022-07-30 NOTE — Assessment & Plan Note (Signed)
Continue with levothyroxine  

## 2022-07-30 NOTE — Assessment & Plan Note (Signed)
Continue blood pressure control with bidil and metoprolol.

## 2022-07-30 NOTE — TOC Progression Note (Addendum)
Transition of Care [facility]) - Progression Note    Patient Details  Name: Rick [name] MRN: [id number] Date of Birth: 01/04/1929  Transition of Care [facility]) CM/SW Contact  Zenon Mayo, RN Phone Number: 07/30/2022, 12:22 PM  Clinical Narrative:    NCM  informed wife is declining SNF and wants HH for patient, spoke with daughter at the bedside, offered choice for HHPT, HHOT, she called [name] , the patient's wife and the wife states they want to go with [facility].  NCM made referral to [facility] with [name], he is able to take referral.  Soc will begin 24 to 48 hrs post dc.  Daughter states she will transport patient home at dc.  Patient has a scale and bp cuff at home, she states he will need some nutritional information at dc.  NCM will inform staff RN to give patient some nutritional information for CHF. Daughter states he has a BSC at home that he can use for a shower chair.    Expected Discharge Plan: [place] Barriers to Discharge: Continued Medical Work up  Expected Discharge Plan and Services In-house Referral: NA Discharge Planning Services: CM Consult Post Acute Care Choice: [place] arrangements for the past 2 months: Single Family Home                 DME Arranged: N/A DME Agency: NA       HH Arranged: OT, PT [place] Agency: [facility] Date [place]: 07/30/22 Time [place]: 1222 Representative spoke with at [place]: [name] Determinants of Health (SDOH) Interventions SDOH Screenings   Food Insecurity: No Food Insecurity (06/11/2022)  Housing: Low Risk  (06/11/2022)  Transportation Needs: No Transportation Needs (06/11/2022)  Alcohol Screen: Low Risk  (11/27/2021)  Depression (PHQ2-9): Low Risk  (11/27/2021)  Financial Resource Strain: Low Risk  (11/27/2021)  Physical Activity: Insufficiently Active (11/27/2021)  Social Connections: Socially Integrated (11/27/2021)  Stress: No Stress Concern Present  (11/27/2021)  Tobacco Use: Medium Risk (07/16/2022)    Readmission Risk Interventions    07/25/2022    4:19 PM  Readmission Risk Prevention Plan  Transportation Screening Complete  PCP or Specialist Appt within 3-5 Days Complete  HRI or [place] Not Complete  HRI or Home Care Consult comments need pt eval  Social Work Consult for [place] Planning/Counseling Not Complete  SW consult not completed comments need pt eval  Palliative Care Screening Not Applicable  Medication Review Press photographer) Complete

## 2022-07-30 NOTE — Progress Notes (Signed)
Nephrology Follow-Up Consult note   Assessment/Recommendations: Rick [name] is a/an 89 y.o. male with a past medical history significant for CKD 4, CHF, severe MR, admitted for acute on chronic heart failure exacerbation.     Non-Oliguric AKI on CKD IV: BL Crt ~3.5. Possible cardiorenal syndrome - optimize volume status.  He would need a graft for HD per Dr. Claretha Cooper cliinc note - he is 89 years old.  It appears he has delirium rather than dementia.  Given his age I am concerned about his candidacy for and tolerance of dialysis and we are hoping to optimize with medical measures.    Chronic heart failure exacerbation:  - Fluid status is much improved - 07/24/22 weight of 81.8 kg and 07/30/22 weight of 72 kg  - Pause metolazone  - Resume lasix at 80 mg IV once today.  Give albumin 25 gram IV once     Iron deficiency/CKD anemia: s/p IV iron and on ESA.  Increased dose to 100 mcg weekly on Saturdays  Hypothyroidism: on Synthroid  Hypoalbuminemia: optimize nutrition per primary team - at this time doesn't have potassium or phosphorus restrictions.    Hyponatremia:  - Secondary to fluid volume overload which is resolving    - s/p tolvaptan 7.5 mg once on 2/20 - unchanged essentially  - Diuretics as above - Check serum and urine osms and urine sodium   - Repeat sodium at 1500 today   Delirium:  Improved per charting.  Team has recommended delirium precautions.  Note that he does not carry a diagnosis of dementia - discussed with primary team and they have clarified charting  Disposition - continue inpatient monitoring   ___________________________________________________________  CC: shortness of breath   Interval History/Subjective: he had 1.6 L UOP over 2/20 charted.  He feels alright.  I updated his wife who is at bedside.  She also indicates no known diagnosis of dementia.  He has just gotten a bath with OT.   Review of systems: He denies shortness of breath  Denies n/v  Making  good urine output - purewick  Medications:  Current Facility-Administered Medications  Medication Dose Route Frequency Provider Last Rate Last Admin   acetaminophen (TYLENOL) tablet 650 mg  650 mg Oral Q6H PRN Etta Quill, DO   650 mg at 07/27/22 2130   Or   acetaminophen (TYLENOL) suppository 650 mg  650 mg Rectal Q6H PRN Etta Quill, DO       albuterol (PROVENTIL) (2.5 MG/3ML) 0.083% nebulizer solution 2.5 mg  2.5 mg Nebulization Q2H PRN Swayze, Ava, DO   2.5 mg at 07/26/22 0442   [START ON 08/02/2022] Darbepoetin Alfa (ARANESP) injection 100 mcg  100 mcg Subcutaneous Q Sat-1800 Harrie Jeans C, MD       ferrous sulfate tablet 325 mg  325 mg Oral Q breakfast Etta Quill, DO   325 mg at 07/30/22 [id number]   heparin injection 5,000 Units  5,000 Units Subcutaneous Q8H Etta Quill, DO   5,000 Units at 07/30/22 [id number]   isosorbide-hydrALAZINE (BIDIL) 20-37.5 MG per tablet 1 tablet  1 tablet Oral BID Etta Quill, DO   1 tablet at 07/30/22 0829   latanoprost (XALATAN) 0.005 % ophthalmic solution 1 drop  1 drop Both Eyes QHS Jennette Kettle M, DO   1 drop at 07/30/22 0010   levothyroxine (SYNTHROID) tablet 50 mcg  50 mcg Oral Q0600 Etta Quill, DO   50 mcg at 07/30/22 [id number]   metoprolol succinate (  TOPROL-XL) 24 hr tablet 12.5 mg  12.5 mg Oral Daily Jennette Kettle M, DO   12.5 mg at 07/30/22 0829   pantoprazole (PROTONIX) EC tablet 40 mg  40 mg Oral Daily Jennette Kettle M, DO   40 mg at 07/30/22 [id number]   polyethylene glycol (MIRALAX / GLYCOLAX) packet 17 g  17 g Oral Daily Swayze, Ava, DO   17 g at 07/29/22 0803   tamsulosin (FLOMAX) capsule 0.4 mg  0.4 mg Oral Daily Jennette Kettle M, DO   0.4 mg at 07/30/22 0829   timolol (TIMOPTIC) 0.5 % ophthalmic solution 1 drop  1 drop Both Eyes BID Etta Quill, DO   1 drop at 07/30/22 0829      Physical Exam: Vitals:   07/30/22 0349 07/30/22 0811  BP: 118/73 123/68  Pulse: 63 63  Resp: 18   Temp: (!) 97.5 F (36.4 C) (!) 97.4 F  (36.3 C)  SpO2: 95% 97%   No intake/output data recorded.  Intake/Output Summary (Last 24 hours) at 07/30/2022 0847 Last data filed at 07/30/2022 0353 Gross per 24 hour  Intake --  Output 1600 ml  Net -1600 ml   General elderly male in bed in no acute distress   HEENT normocephalic atraumatic extraocular movements intact sclera anicteric Neck supple trachea midline Lungs clear to auscultation bilaterally normal work of breathing at rest  Heart S1S2 no rub Abdomen soft nontender nondistended Extremities 1+ edema lower legs and some edema in hips and abdominal wall  Psych normal mood and affect Neuro - alert and oriented to person, year, and location; follows commands and provides history GU - Purewick in place   Test Results I personally reviewed new and old clinical labs and radiology tests Lab Results  Component Value Date   NA 125 (L) 07/30/2022   K 4.0 07/30/2022   CL 83 (L) 07/30/2022   CO2 28 07/30/2022   BUN 38 (H) 07/30/2022   CREATININE 4.28 (H) 07/30/2022   GFR 19.64 (L) 12/31/2021   GLU 114 06/19/2022   CALCIUM 8.1 (L) 07/30/2022   ALBUMIN 1.9 (L) 07/30/2022   PHOS 2.3 (L) 07/30/2022    CBC Recent Labs  Lab 07/23/22 2050 07/23/22 2052 07/25/22 0148 07/27/22 0101 07/29/22 0109  WBC 9.6   < > 7.6 9.7 8.6  NEUTROABS 8.4*  --  6.5  --   --   HGB 8.8*   < > 8.1* 7.4* 7.6*  HCT 27.0*   < > 25.2* 22.8* 23.3*  MCV 87.4   < > 84.8 86.7 85.7  PLT 282   < > 266 219 195   < > = values in this interval not displayed.   Claudia Desanctis, MD 9:21 AM 07/30/2022

## 2022-07-30 NOTE — Assessment & Plan Note (Addendum)
Transitory delirium that has improved. Patient will continue his recovery at home with home health services.  I spoke with his daughter about therapy recommendations for SNF, but family prefers to take patient home.

## 2022-07-30 NOTE — Progress Notes (Addendum)
Occupational Therapy Treatment Patient Details Name: Rick [name] MRN: [id number] DOB: 1928-12-03 Today's Date: 07/30/2022   History of present illness ATO CRONEN is a 89 y.o. male admitted 07/23/22 with anascarca and SOB and dx with CHF exacerbation. He is found to have pulmonary edema and pleural effusions. PMH includes CKD 4, severe MR, prostate CA, pelvic fx   OT comments  Pt seen for OT ADL retraining session with focus on bed mobility (Min A/HHA supine to sit with HOB elevated, and sit to supine supervision and vc's. Pt used rails & LE's to scoot up in bed, no hands on assist); grooming sitting up at EOB with set-up; bathing UB sitting up at EOB (Pt completed trunk, arms w/ Set-up assist, Min A to bathe/dry back), pt/family education for energy conservation techniques. Handout was issued and reviewed with pt and his wife this date. Pt progressing toward POC and OT goals. Pt A & O x3.   Recommendations for follow up therapy are one component of a multi-disciplinary discharge planning process, led by the attending physician.  Recommendations may be updated based on patient status, additional functional criteria and insurance authorization.    Follow Up Recommendations  Skilled nursing-short term rehab (<3 hours/day)     Assistance Recommended at Discharge Frequent or constant Supervision/Assistance  Patient can return home with the following  A little help with walking and/or transfers;A little help with bathing/dressing/bathroom;Assist for transportation;Help with stairs or ramp for entrance   Equipment Recommendations  Tub/shower bench    Recommendations for Other Services      Precautions / Restrictions Precautions Precautions: Fall Precaution Comments: monitor sats       Mobility Bed Mobility Overal bed mobility: Needs Assistance Bed Mobility: Supine to Sit, Sit to Supine     Supine to sit: Min guard, Min assist, HOB elevated (HHA to elevate trunk into  sitting) Sit to supine: Supervision, Modified independent (Device/Increase time) (No hands on assist when transferring from sitting to supine. Pt able to reposition in bed with vc's/tc's to flex knees.)   General bed mobility comments: Pt able to manage bilateral LE's    Transfers Overall transfer level: Needs assistance Equipment used: Rolling walker (2 wheels) Transfers: Sit to/from Stand Sit to Stand: Min guard    Lateral/Scoot Transfers: Supervision, Min guard     Balance Overall balance assessment: No apparent balance deficits (not formally assessed) Sitting-balance support: Bilateral upper extremity supported, No upper extremity supported, Feet supported Sitting balance-Leahy Scale: Fair     ADL either performed or assessed with clinical judgement   ADL Overall ADL's : Needs assistance/impaired     Grooming: Wash/dry hands;Wash/dry face;Set up;Sitting Grooming Details (indicate cue type and reason): Sitting up at EOB Upper Body Bathing: Set up;Minimal assistance;Sitting Upper Body Bathing Details (indicate cue type and reason): Pt sitting at EOB. Pt bathed UB with set-up, min A to wash/dry back Lower Body Bathing: Maximal assistance   Functional mobility during ADLs: Minimal assistance;Rolling walker (2 wheels) (Sit to stand from EOB with 2 lateral steps to reposition higher in bed) General ADL Comments: Pt seen for OT ADL retraining session with focus on bed mobility (Min A/HHA supine to sit with HOB elevated, and sit to supine supervision and vc's. Pt used rails & LE's to scoot up in bed, no hands on assist); grooming sitting up at EOB with set-up; bathing UB sitting up at EOB (Pt completed trunk, arms w/ Set-up assist, Min A to bathe/dry back), pt/family education for energy conservation techniques.  Handout was issued and reviewed with pt and his wife this date. Pt progressing toward POC and OT goals. Pt A & O x3.   Extremity/Trunk Assessment Upper Extremity  Assessment Upper Extremity Assessment: Overall WFL for tasks assessed;Generalized weakness   Lower Extremity Assessment Lower Extremity Assessment: Defer to PT evaluation   Cervical / Trunk Assessment Cervical / Trunk Assessment: Kyphotic    Vision Baseline Vision/History: 1 Wears glasses Ability to See in Adequate Light: 0 Adequate Patient Visual Report: No change from baseline            Cognition Arousal/Alertness: Awake/alert Behavior During Therapy: WFL for tasks assessed/performed Overall Cognitive Status: Within Functional Limits for tasks assessed                   Pertinent Vitals/ Pain       Pain Assessment Pain Assessment: No/denies pain Faces Pain Scale: No hurt  Home Living  Refer to initial OT assessment for home living details    Prior Functioning/Environment  Refer to OT initial assessment for PLOF details Pt reports Mod I PTA   Frequency  Min 2X/week        Progress Toward Goals  OT Goals(current goals can now be found in the care plan section)  Progress towards OT goals: Progressing toward goals  Acute Rehab OT Goals Patient Stated Goal: None stated OT Goal Formulation: With patient Time For Goal Achievement: 08/09/22 Potential to Achieve Goals: Good  Plan Discharge plan remains appropriate       AM-PAC OT "6 Clicks" Daily Activity     Outcome Measure   Help from another person eating meals?: None Help from another person taking care of personal grooming?: A Little Help from another person toileting, which includes using toliet, bedpan, or urinal?: A Lot Help from another person bathing (including washing, rinsing, drying)?: A Little Help from another person to put on and taking off regular upper body clothing?: A Little Help from another person to put on and taking off regular lower body clothing?: A Little 6 Click Score: 18    End of Session Equipment Utilized During Treatment: Rolling walker (2 wheels)  OT Visit Diagnosis:  Unsteadiness on feet (R26.81);Other abnormalities of gait and mobility (R26.89);Muscle weakness (generalized) (M62.81);History of falling (Z91.81);Other symptoms and signs involving cognitive function   Activity Tolerance Patient tolerated treatment well   Patient Left in bed;with call bell/phone within reach;with bed alarm set;with family/visitor present;with nursing/sitter in room   Nurse Communication Mobility status        Time: [id number] OT Time Calculation (min): 27 min  Charges: OT General Charges $OT Visit: 1 Visit OT Treatments $Self Care/Home Management : 23-37 mins   Almyra Deforest, OTR/L 07/30/2022, 9:38 AM

## 2022-07-30 NOTE — Progress Notes (Signed)
Mobility Specialist - Progress Note   07/30/22 1400  Mobility  Activity Transferred from bed to chair  Level of Assistance Minimal assist, patient does 75% or more  Assistive Device Front wheel walker  Activity Response Tolerated well  Mobility Referral Yes  $Mobility charge 1 Mobility    Pt received in bed agreeable to mobility. No complaints throughout. MinA to stand and pivot to recliner. Left in recliner w/ chair alarm on and call bell in his lap.   [place] Specialist Please contact via SecureChat or Rehab office at [phone]

## 2022-07-30 NOTE — Progress Notes (Addendum)
Progress Note   Patient: Rick [name] [id number] DOB: 1929-05-30 DOA: 07/23/2022     6 DOS: the patient was seen and examined on 07/30/2022   Brief hospital course: The patient is a 89 yr old man with stage IV CKD. He presents with anasarca and shortness of breath. He is found to have pulmonary edema, anasarca, and pleural effusions. He was given 120 mg Lasix in the ED with 200 cc output over 12 hours. Nephrology has been consulted and wishes to continue attempts to aggressively diurese. They state that the patient will need to be set up for HD access as outpatient. The patient states that he wishes to go on dialysis despite advice to the contrary.   Dr. Joylene Grapes has evaluated the patient. He recommends continued aggressive diuresis with 120 mg lasix tid. He recommends the addition of metolazone for decreased urinary output. Echocardiogram has been ordered to re-evaluate the patient's cardiac function. Most recent echo demonstrated an EF of 30% on 06/01/22.   Assessment and Plan: Acute on chronic combined systolic and diastolic CHF (congestive heart failure) (HCC) Echocardiogram with reduced LV systolic function EF 30 to 35%, with global hypokinesis. Mild LVH, E-A fusion, RV systolic function preserved, LA with moderate dilatation. Small pericardial effusion. Moderate to severe mitral regurgitation, moderate aortic regurgitation.  Urine output is  [id number] cc Systolic blood pressure is 112 to 125 mmHg.   02/20 tolvaptan 7.5 mg x1 dose.  Patient had one dose of furosemide today 80 mg IV One dose of IV albumin. Continue bidil and metoprolol.   Stage 4 chronic kidney disease (HCC) Polycystic renal disease.  Non oliguric acute renal failure. Hyponatremia   Renal function today with serum cr at 4,28 with K at 4,0 and serum bicarbonate at 28. Na 125, P 2,3  Continue supportive medical therapy, had one dose of furosemide today Follow up renal function and electrolytes in am.  Considering  patient's age, and advance heart failure, likely not a good candidate for renal replacement therapy.   Anemia of chronic renal disease.  Stable hgb, continue with EPO.   Essential hypertension Continue blood pressure control with bidil and metoprolol.   Hypothyroidism Continue with levothyroxine.   Acute metabolic encephalopathy With delirium.   Mentation has improved, today with no agitation and able to follow commands and responds to questions. Continue supportive medical therapy.         Subjective: Patient is feeling well this am with no dyspnea or chest pain, continue very weak and deconditioned, his daughter is at the bedside   Physical Exam: Vitals:   07/30/22 0000 07/30/22 0349 07/30/22 0811 07/30/22 1113  BP: 136/84 118/73 123/68 112/60  Pulse: 70 63 63 (!) 55  Resp: 16 18    Temp: (!) 97.3 F (36.3 C) (!) 97.5 F (36.4 C) (!) 97.4 F (36.3 C) (!) 97.4 F (36.3 C)  TempSrc:  Oral Axillary Axillary  SpO2: 96% 95% 97% 100%  Weight:  72 kg    Height:       Neurology awake and alert ENT with positive pallor Cardiovascular with S1 and S2 present with positive systolic murmur at the apex with radiation to the axilla No JVD Lower extremity edema + Respiratory with no rales or wheezing Abdomen with no distention  Data Reviewed:    Family Communication: I spoke with patient's daughter at the bedside, we talked in detail about patient's condition, plan of care and prognosis and all questions were addressed.   Disposition: Status is: Inpatient  Remains inpatient appropriate because: heart and renal failure   Planned Discharge Destination: Home      Author: Tawni Millers, MD 07/30/2022 12:28 PM  For on call review [email or web address].

## 2022-07-31 DIAGNOSIS — N184 Chronic kidney disease, stage 4 (severe): Secondary | ICD-10-CM | POA: Diagnosis not present

## 2022-07-31 DIAGNOSIS — G9341 Metabolic encephalopathy: Secondary | ICD-10-CM

## 2022-07-31 DIAGNOSIS — I5043 Acute on chronic combined systolic (congestive) and diastolic (congestive) heart failure: Secondary | ICD-10-CM | POA: Diagnosis not present

## 2022-07-31 DIAGNOSIS — I10 Essential (primary) hypertension: Secondary | ICD-10-CM | POA: Diagnosis not present

## 2022-07-31 LAB — RENAL FUNCTION PANEL
Albumin: 2.1 g/dL — ABNORMAL LOW (ref 3.5–5.0)
Anion gap: 13 (ref 5–15)
BUN: 41 mg/dL — ABNORMAL HIGH (ref 8–23)
CO2: 31 mmol/L (ref 22–32)
Calcium: 8.2 mg/dL — ABNORMAL LOW (ref 8.9–10.3)
Chloride: 79 mmol/L — ABNORMAL LOW (ref 98–111)
Creatinine, Ser: 4.27 mg/dL — ABNORMAL HIGH (ref 0.61–1.24)
GFR, Estimated: 12 mL/min — ABNORMAL LOW (ref >60)
Glucose, Bld: 100 mg/dL — ABNORMAL HIGH (ref 70–99)
Phosphorus: 2 mg/dL — ABNORMAL LOW (ref 2.5–4.6)
Potassium: 3.6 mmol/L (ref 3.5–5.1)
Sodium: 123 mmol/L — ABNORMAL LOW (ref 135–145)

## 2022-07-31 LAB — MAGNESIUM: Magnesium: 2 mg/dL (ref 1.7–2.4)

## 2022-07-31 LAB — SODIUM: Sodium: 122 mmol/L — ABNORMAL LOW (ref 135–145)

## 2022-07-31 MED ORDER — ALBUMIN HUMAN 25 % IV SOLN
25.0000 g | Freq: Once | INTRAVENOUS | Status: AC
  Administered 2022-07-31: 25 g via INTRAVENOUS
  Filled 2022-07-31: qty 100

## 2022-07-31 MED ORDER — FUROSEMIDE 10 MG/ML IJ SOLN
80.0000 mg | Freq: Once | INTRAMUSCULAR | Status: AC
  Administered 2022-07-31: 80 mg via INTRAVENOUS
  Filled 2022-07-31: qty 8

## 2022-07-31 MED ORDER — TOLVAPTAN 15 MG PO TABS
15.0000 mg | ORAL_TABLET | Freq: Once | ORAL | Status: AC
  Administered 2022-07-31: 15 mg via ORAL
  Filled 2022-07-31: qty 1

## 2022-07-31 MED ORDER — SODIUM PHOSPHATES 45 MMOLE/15ML IV SOLN
15.0000 mmol | Freq: Once | INTRAVENOUS | Status: AC
  Administered 2022-07-31: 15 mmol via INTRAVENOUS
  Filled 2022-07-31: qty 5

## 2022-07-31 NOTE — Progress Notes (Signed)
Physical Therapy Treatment Patient Details Name: Rick [name] MRN: [id number] DOB: 1928/12/08 Today's Date: 07/31/2022   History of Present Illness Rick [name] is a 89 y.o. male admitted 07/23/22 with anascarca and SOB and dx with CHF exacerbation. He is found to have pulmonary edema and pleural effusions. PMH includes CKD 4, severe MR, prostate CA, pelvic fx    PT Comments    Pt tolerated treatment well. Despite being quite lethargic pt was very willing to participate and was able to ambulate 75f w RW Min A. No change in DC/DME needs. Pt would benefit from further mobility training during acute stay.    Recommendations for follow up therapy are one component of a multi-disciplinary discharge planning process, led by the attending physician.  Recommendations may be updated based on patient status, additional functional criteria and insurance authorization.  Follow Up Recommendations  Skilled nursing-short term rehab (<3 hours/day) Can patient physically be transported by private vehicle: No   Assistance Recommended at Discharge Frequent or constant Supervision/Assistance  Patient can return home with the following A lot of help with walking and/or transfers;A lot of help with bathing/dressing/bathroom;Assist for transportation;Help with stairs or ramp for entrance   Equipment Recommendations  None recommended by PT    Recommendations for Other Services       Precautions / Restrictions Precautions Precautions: Fall Precaution Comments: monitor sats Restrictions Weight Bearing Restrictions: No     Mobility  Bed Mobility Overal bed mobility: Needs Assistance Bed Mobility: Supine to Sit, Sit to Supine     Supine to sit: Min guard Sit to supine: Supervision, Modified independent (Device/Increase time)        Transfers Overall transfer level: Needs assistance Equipment used: Rolling walker (2 wheels) Transfers: Sit to/from Stand Sit to Stand: Min guard                 Ambulation/Gait Ambulation/Gait assistance: MHerbalist(Feet): 50 Feet Assistive device: Rolling walker (2 wheels) Gait Pattern/deviations: Step-through pattern, Step-to pattern, Decreased stride length, Wide base of support, Trunk flexed Gait velocity: decreased     General Gait Details: tends to push walker out too far and get out of space, rushing due to his fatigue   Stairs             Wheelchair Mobility    Modified Rankin (Stroke Patients Only)       Balance Overall balance assessment: No apparent balance deficits (not formally assessed)                                          Cognition Arousal/Alertness: Lethargic Behavior During Therapy: Flat affect Overall Cognitive Status: Within Functional Limits for tasks assessed                                          Exercises      General Comments General comments (skin integrity, edema, etc.): Pt daughter said that he had a rough night so he was very lethargic during session however was willing to work.      Pertinent Vitals/Pain Pain Assessment Pain Assessment: No/denies pain    Home Living  Prior Function            PT Goals (current goals can now be found in the care plan section) Progress towards PT goals: Progressing toward goals    Frequency    Min 3X/week      PT Plan Current plan remains appropriate    Co-evaluation              AM-PAC PT "6 Clicks" Mobility   Outcome Measure  Help needed turning from your back to your side while in a flat bed without using bedrails?: A Little Help needed moving from lying on your back to sitting on the side of a flat bed without using bedrails?: A Little Help needed moving to and from a bed to a chair (including a wheelchair)?: A Little Help needed standing up from a chair using your arms (e.g., wheelchair or bedside chair)?: A Little Help needed to  walk in hospital room?: A Lot Help needed climbing 3-5 steps with a railing? : Total 6 Click Score: 15    End of Session Equipment Utilized During Treatment: Gait belt Activity Tolerance: Patient tolerated treatment well;Patient limited by lethargy Patient left: in bed;with call bell/phone within reach;with family/visitor present;with nursing/sitter in room Nurse Communication: Mobility status PT Visit Diagnosis: Unsteadiness on feet (R26.81);Muscle weakness (generalized) (M62.81);Difficulty in walking, not elsewhere classified (R26.2)     Time: [id number] PT Time Calculation (min) (ACUTE ONLY): 16 min  Charges:  $Gait Training: 8-22 mins                     Shelby Mattocks, PT, DPT Acute Rehab Services [id number]    Viann Shove 07/31/2022, 12:35 PM

## 2022-07-31 NOTE — Progress Notes (Signed)
Nephrology Follow-Up Consult note   Assessment/Recommendations: Rick [name] is a/an 89 y.o. male with a past medical history significant for CKD 4, CHF, severe MR, admitted for acute on chronic heart failure exacerbation.     Non-Oliguric AKI on CKD IV: BL Crt ~3.5. Possible cardiorenal syndrome - optimize volume status.  He would need a graft for HD per Dr. Claretha Cooper cliinc note - he is 89 years old.  Given his age I am concerned about his candidacy for and tolerance of dialysis and we are hoping to optimize with medical measures.    Hyponatremia:  - Secondary to fluid volume overload which is resolving    - s/p tolvaptan 7.5 mg once on 2/20 - minimal increase in sodium - Tolvaptan 15 mg once on 2/22  - Diuretics as above - Repeat sodium every 8 hours per protocol x 2  - If this fails would need 3% saline as early as tomorrow   Chronic heart failure exacerbation:  - Fluid status is much improved - 07/24/22 weight of 81.8 kg and 07/30/22 weight of 72 kg  - Defer lasix today.  Give albumin 25 gram IV once      Iron deficiency/CKD anemia: s/p IV iron and on ESA.  Increased dose to 100 mcg weekly on Saturdays  Hypothyroidism: on Synthroid  Hypoalbuminemia: optimize nutrition per primary team - at this time doesn't have potassium or phosphorus restrictions.    Delirium:  Improved per charting.  Team has recommended delirium precautions.  Note that he does not carry a diagnosis of dementia   Hypophosphatemia  - replete phosphorus  Disposition - continue inpatient monitoring   ___________________________________________________________  CC: shortness of breath   Interval History/Subjective: he had 2.1 L UOP over 2/21.  I spoke with his daughter at bedside.  He didn't get much sleep overnight so just went to sleep around 6 am this morning per her report.   Review of systems:  He denies shortness of breath  Denies n/v  Making good urine output - purewick  Medications:  Current  Facility-Administered Medications  Medication Dose Route Frequency Provider Last Rate Last Admin   acetaminophen (TYLENOL) tablet 650 mg  650 mg Oral Q6H PRN Etta Quill, DO   650 mg at 07/27/22 2130   Or   acetaminophen (TYLENOL) suppository 650 mg  650 mg Rectal Q6H PRN Etta Quill, DO       albuterol (PROVENTIL) (2.5 MG/3ML) 0.083% nebulizer solution 2.5 mg  2.5 mg Nebulization Q2H PRN Swayze, Ava, DO   2.5 mg at 07/26/22 0442   [START ON 08/02/2022] Darbepoetin Alfa (ARANESP) injection 100 mcg  100 mcg Subcutaneous Q Sat-1800 Claudia Desanctis, MD       ferrous sulfate tablet 325 mg  325 mg Oral Q breakfast Etta Quill, DO   325 mg at 07/31/22 0844   heparin injection 5,000 Units  5,000 Units Subcutaneous Q8H Jennette Kettle M, DO   5,000 Units at 07/31/22 0700   isosorbide-hydrALAZINE (BIDIL) 20-37.5 MG per tablet 1 tablet  1 tablet Oral BID Etta Quill, DO   1 tablet at 07/31/22 0844   latanoprost (XALATAN) 0.005 % ophthalmic solution 1 drop  1 drop Both Eyes QHS Jennette Kettle M, DO   1 drop at 07/30/22 2123   levothyroxine (SYNTHROID) tablet 50 mcg  50 mcg Oral Q0600 Etta Quill, DO   50 mcg at 07/31/22 0700   metoprolol succinate (TOPROL-XL) 24 hr tablet 12.5 mg  12.5  mg Oral Daily Jennette Kettle M, DO   12.5 mg at 07/31/22 0844   pantoprazole (PROTONIX) EC tablet 40 mg  40 mg Oral Daily Etta Quill, DO   40 mg at 07/31/22 0844   polyethylene glycol (MIRALAX / GLYCOLAX) packet 17 g  17 g Oral Daily Swayze, Ava, DO   17 g at 07/31/22 0844   tamsulosin (FLOMAX) capsule 0.4 mg  0.4 mg Oral Daily Jennette Kettle M, DO   0.4 mg at 07/31/22 0844   timolol (TIMOPTIC) 0.5 % ophthalmic solution 1 drop  1 drop Both Eyes BID Etta Quill, DO   1 drop at 07/31/22 0845      Physical Exam: Vitals:   07/31/22 0400 07/31/22 0742  BP: 133/71 132/73  Pulse: 69 66  Resp: 16 18  Temp: (!) 97.4 F (36.3 C) (!) 97 F (36.1 C)  SpO2: 95% 91%   Total I/O In: 360  [P.O.:360] Out: 500 [Urine:500]  Intake/Output Summary (Last 24 hours) at 07/31/2022 1017 Last data filed at 07/31/2022 0830 Gross per 24 hour  Intake 360 ml  Output 1850 ml  Net -1490 ml   General elderly male in bed in no acute distress    HEENT normocephalic atraumatic extraocular movements intact sclera anicteric Neck supple trachea midline Lungs clear to auscultation bilaterally normal work of breathing at rest  Heart S1S2 no rub Abdomen soft nontender nondistended Extremities trace edema lower legs; trace to no edema appreciated in hips and abdominal wall today  Psych normal mood and affect Neuro - alert and oriented to person, year, and location; follows commands and provides history. Sleepy today but didn't get much sleep overnight GU - Purewick in place   Test Results I personally reviewed new and old clinical labs and radiology tests Lab Results  Component Value Date   NA 123 (L) 07/31/2022   K 3.6 07/31/2022   CL 79 (L) 07/31/2022   CO2 31 07/31/2022   BUN 41 (H) 07/31/2022   CREATININE 4.27 (H) 07/31/2022   GFR 19.64 (L) 12/31/2021   GLU 114 06/19/2022   CALCIUM 8.2 (L) 07/31/2022   ALBUMIN 2.1 (L) 07/31/2022   PHOS 2.0 (L) 07/31/2022    CBC Recent Labs  Lab 07/25/22 0148 07/27/22 0101 07/29/22 0109  WBC 7.6 9.7 8.6  NEUTROABS 6.5  --   --   HGB 8.1* 7.4* 7.6*  HCT 25.2* 22.8* 23.3*  MCV 84.8 86.7 85.7  PLT 266 219 195   Claudia Desanctis, MD 10:43 AM 07/31/2022

## 2022-07-31 NOTE — Progress Notes (Signed)
Progress Note   Patient: Rick [name] [id number] DOB: 1928-07-07 DOA: 07/23/2022     7 DOS: the patient was seen and examined on 07/31/2022   Brief hospital course: The patient is a 89 yr old man with stage IV CKD. He presents with anasarca and shortness of breath. He is found to have pulmonary edema, anasarca, and pleural effusions. He was given 120 mg Lasix in the ED with 200 cc output over 12 hours. Nephrology has been consulted and wishes to continue attempts to aggressively diurese. They state that the patient will need to be set up for HD access as outpatient. The patient states that he wishes to go on dialysis despite advice to the contrary.   Dr. Joylene Grapes has evaluated the patient. He recommends continued aggressive diuresis with 120 mg lasix tid. He recommends the addition of metolazone for decreased urinary output. Echocardiogram has been ordered to re-evaluate the patient's cardiac function. Most recent echo demonstrated an EF of 30% on 06/01/22.   02/22 volume status has improved, continue to have hyponatremia.   Assessment and Plan: Acute on chronic combined systolic and diastolic CHF (congestive heart failure) (HCC) Echocardiogram with reduced LV systolic function EF 30 to 35%, with global hypokinesis. Mild LVH, E-A fusion, RV systolic function preserved, LA with moderate dilatation. Small pericardial effusion. Moderate to severe mitral regurgitation, moderate aortic regurgitation.  Urine output is  0000000 cc Systolic blood pressure is 136 to 103 mmHg.   02/20 tolvaptan 7.5 mg x1 dose.  Patient had IV furosemide and  IV albumin. Continue bidil and metoprolol.  Plan to repeat dose of tolvaptan today.   Stage 4 chronic kidney disease (HCC) Polycystic renal disease.  Non oliguric acute renal failure. Hyponatremia   Improved volume status.  Renal function with serum cr at 4,27 with K at 3,6 and serum bicarbonate at 31. Na 123. Mg 2,0  Follow up renal function and  electrolytes in am.  Hold on loop diuretic for now.   Anemia of chronic renal disease.  Stable hgb, continue with EPO.   Essential hypertension Continue blood pressure control with bidil and metoprolol.   Hypothyroidism Continue with levothyroxine.   Acute metabolic encephalopathy With delirium.   Mentation has improved. Continue supportive medical therapy.         Subjective: Patient is feeling well, no chest pain, dyspnea or edema,  Physical Exam: Vitals:   07/31/22 0000 07/31/22 0400 07/31/22 0742 07/31/22 1121  BP: 131/77 133/71 132/73 103/67  Pulse: 64 69 66 (!) 50  Resp: 18 16 18 18  $ Temp: 97.6 F (36.4 C) (!) 97.4 F (36.3 C) (!) 97 F (36.1 C) (!) 97.1 F (36.2 C)  TempSrc:  Oral Axillary Axillary  SpO2: 99% 95% 91% 93%  Weight:      Height:       Neurology awake and alert, responds to simple questions and follows commands. No agitation  ENT with mild pallor Cardiovascular with S1 and S2 present and rhythmic with no gallops, rubs or murmurs Respiratory with no rales or wheezing, no rhonchi Abdomen with no distention  Data Reviewed:    Family Communication: I spoke with patient's daughter at the bedside, we talked in detail about patient's condition, plan of care and prognosis and all questions were addressed.   Disposition: Status is: Inpatient Remains inpatient appropriate because: pending electrolytes to improve, before his discharge.   Planned Discharge Destination: Home      Author: Tawni Millers, MD 07/31/2022 11:48 AM  For  on call review [email or web address].

## 2022-07-31 NOTE — Progress Notes (Signed)
Evening Na reviewed. Na down to 122 despite full dose tolvaptan this AM. Continue to trend for now, consider r/s'ing lasix depending on volume status otherwise anticipating that he will need to be started on 3% by tomorrow if there is no significant improvement.  Gean Quint, MD [facility]

## 2022-08-01 DIAGNOSIS — G9341 Metabolic encephalopathy: Secondary | ICD-10-CM | POA: Diagnosis not present

## 2022-08-01 DIAGNOSIS — N184 Chronic kidney disease, stage 4 (severe): Secondary | ICD-10-CM | POA: Diagnosis not present

## 2022-08-01 DIAGNOSIS — I5043 Acute on chronic combined systolic (congestive) and diastolic (congestive) heart failure: Secondary | ICD-10-CM | POA: Diagnosis not present

## 2022-08-01 DIAGNOSIS — I10 Essential (primary) hypertension: Secondary | ICD-10-CM | POA: Diagnosis not present

## 2022-08-01 LAB — RENAL FUNCTION PANEL
Albumin: 2.7 g/dL — ABNORMAL LOW (ref 3.5–5.0)
Anion gap: 13 (ref 5–15)
BUN: 43 mg/dL — ABNORMAL HIGH (ref 8–23)
CO2: 30 mmol/L (ref 22–32)
Calcium: 8.4 mg/dL — ABNORMAL LOW (ref 8.9–10.3)
Chloride: 79 mmol/L — ABNORMAL LOW (ref 98–111)
Creatinine, Ser: 4.16 mg/dL — ABNORMAL HIGH (ref 0.61–1.24)
GFR, Estimated: 13 mL/min — ABNORMAL LOW (ref >60)
Glucose, Bld: 98 mg/dL (ref 70–99)
Phosphorus: 2.7 mg/dL (ref 2.5–4.6)
Potassium: 3.2 mmol/L — ABNORMAL LOW (ref 3.5–5.1)
Sodium: 122 mmol/L — ABNORMAL LOW (ref 135–145)

## 2022-08-01 LAB — SODIUM
Sodium: 125 mmol/L — ABNORMAL LOW (ref 135–145)
Sodium: 120 mmol/L — ABNORMAL LOW (ref 135–145)
Sodium: 126 mmol/L — ABNORMAL LOW (ref 135–145)
Sodium: 125 mmol/L — ABNORMAL LOW (ref 135–145)

## 2022-08-01 LAB — MRSA NEXT GEN BY PCR, NASAL: MRSA by PCR Next Gen: NOT DETECTED

## 2022-08-01 LAB — GLUCOSE, CAPILLARY
Glucose-Capillary: 128 mg/dL — ABNORMAL HIGH (ref 70–99)
Glucose-Capillary: 141 mg/dL — ABNORMAL HIGH (ref 70–99)
Glucose-Capillary: 113 mg/dL — ABNORMAL HIGH (ref 70–99)

## 2022-08-01 MED ORDER — SODIUM CHLORIDE 3 % IV SOLN
INTRAVENOUS | Status: DC
  Filled 2022-08-01 (×2): qty 500

## 2022-08-01 MED ORDER — SODIUM CHLORIDE 0.9 % IV BOLUS
250.0000 mL | Freq: Once | INTRAVENOUS | Status: AC
  Administered 2022-08-01: 250 mL via INTRAVENOUS

## 2022-08-01 MED ORDER — CHLORHEXIDINE GLUCONATE CLOTH 2 % EX PADS
6.0000 | MEDICATED_PAD | Freq: Every day | CUTANEOUS | Status: DC
  Administered 2022-08-01 – 2022-08-04 (×4): 6 via TOPICAL

## 2022-08-01 MED ORDER — ORAL CARE MOUTH RINSE: 15.0000 mL | OROMUCOSAL | Status: DC | PRN

## 2022-08-01 MED ORDER — POTASSIUM CHLORIDE CRYS ER 20 MEQ PO TBCR
20.0000 meq | EXTENDED_RELEASE_TABLET | Freq: Once | ORAL | Status: AC
  Administered 2022-08-01: 20 meq via ORAL
  Filled 2022-08-01: qty 1

## 2022-08-01 NOTE — Progress Notes (Signed)
Mobility Specialist - Progress Note   08/01/22 1042  Mobility  Activity Ambulated with assistance in room  Level of Assistance Minimal assist, patient does 75% or more  Assistive Device Front wheel walker  Distance Ambulated (ft) 60 ft  Activity Response Tolerated well  Mobility Referral Yes  $Mobility charge 1 Mobility    Pt received in bed agreeable to mobility. MinA to stand from EOB. No complaints throughout. Left in bed w/ bed alarm on and call bell within reach.   [place] Specialist Please contact via SecureChat or Rehab office at [phone]

## 2022-08-01 NOTE — Progress Notes (Signed)
Approximately 1320-- Report provided to oncoming nurse on 73M ICU. Full report provided with all questions answered at this time. At time of report, pt is A&O x 3-4 with VSS. Pt's daughter at bedside. Pt to be transported to ICU on cardiac monitor.

## 2022-08-01 NOTE — Progress Notes (Signed)
Nephrology Follow-Up Consult note   Assessment/Recommendations: Rick [name] is a/an 89 y.o. male with a past medical history significant for CKD 4, CHF, severe MR, admitted for acute on chronic heart failure exacerbation.     Non-Oliguric AKI on CKD IV: BL Crt ~3.5. Possible cardiorenal syndrome - optimize volume status.  He would need a graft for HD per Dr. Claretha Cooper clinic note - he is 89 years old.  Given his age I am concerned about his candidacy for and tolerance of dialysis and we are hoping to optimize with medical measures.    Hyponatremia:  - Secondary to fluid volume overload initially as well as CKD.  With his hypoalbuminemia some of volume is not intravascular.  He has had significant weight loss - s/p tolvaptan 7.5 mg once on 2/20 - minimal increase in sodium and then tolvaptan 15 mg once on 2/22 with sodium down to 122 - NS 250 mL once now  - Will start 3% saline.  I have spoken with the primary team and pharmacy and he will need to move to the ICU for this    Chronic heart failure exacerbation:  - Fluid status is much improved - 07/24/22 weight of 81.8 kg and 07/30/22 weight of 72 kg   Iron deficiency/CKD anemia: s/p IV iron and on ESA.  Increased dose to 100 mcg weekly on Saturdays  Hypothyroidism: on Synthroid  Hypoalbuminemia: optimize nutrition per primary team - at this time doesn't have potassium or phosphorus restrictions.    Delirium:  Improved per charting.  Team has recommended delirium precautions.  Note that he does not carry a diagnosis of dementia   Hypophosphatemia  - repleted phosphorus  Disposition - continue inpatient monitoring   ___________________________________________________________  CC: shortness of breath   Interval History/Subjective: he had 2.3 L UOP over 2/22.  He got tolvaptan 15 mg PO once on 2/22.  Got albumin and lasix yesterday as well.  His daughter is at bedside.  She states that sometimes he will get a little confused.    Review  of systems:   He denies shortness of breath  Denies n/v  Making good urine output - purewick  Medications:  Current Facility-Administered Medications  Medication Dose Route Frequency Provider Last Rate Last Admin   acetaminophen (TYLENOL) tablet 650 mg  650 mg Oral Q6H PRN Etta Quill, DO   650 mg at 07/27/22 2130   Or   acetaminophen (TYLENOL) suppository 650 mg  650 mg Rectal Q6H PRN Etta Quill, DO       albuterol (PROVENTIL) (2.5 MG/3ML) 0.083% nebulizer solution 2.5 mg  2.5 mg Nebulization Q2H PRN Swayze, Ava, DO   2.5 mg at 07/26/22 0442   [START ON 08/02/2022] Darbepoetin Alfa (ARANESP) injection 100 mcg  100 mcg Subcutaneous Q Sat-1800 Harrie Jeans C, MD       ferrous sulfate tablet 325 mg  325 mg Oral Q breakfast Etta Quill, DO   325 mg at 08/01/22 1009   heparin injection 5,000 Units  5,000 Units Subcutaneous Q8H Etta Quill, DO   5,000 Units at 08/01/22 [id number]   isosorbide-hydrALAZINE (BIDIL) 20-37.5 MG per tablet 1 tablet  1 tablet Oral BID Etta Quill, DO   1 tablet at 08/01/22 1009   latanoprost (XALATAN) 0.005 % ophthalmic solution 1 drop  1 drop Both Eyes QHS Jennette Kettle M, DO   1 drop at 07/31/22 2111   levothyroxine (SYNTHROID) tablet 50 mcg  50 mcg Oral Q0600 Alcario Drought,  Toy Care, DO   50 mcg at 08/01/22 0707   metoprolol succinate (TOPROL-XL) 24 hr tablet 12.5 mg  12.5 mg Oral Daily Jennette Kettle M, DO   12.5 mg at 08/01/22 1010   pantoprazole (PROTONIX) EC tablet 40 mg  40 mg Oral Daily Jennette Kettle M, DO   40 mg at 08/01/22 1009   polyethylene glycol (MIRALAX / GLYCOLAX) packet 17 g  17 g Oral Daily Swayze, Ava, DO   17 g at 08/01/22 1008   sodium chloride 0.9 % bolus 250 mL  250 mL Intravenous Once Claudia Desanctis, MD       tamsulosin [facility]) capsule 0.4 mg  0.4 mg Oral Daily Alcario Drought, Jared M, DO   0.4 mg at 08/01/22 1009   timolol (TIMOPTIC) 0.5 % ophthalmic solution 1 drop  1 drop Both Eyes BID Etta Quill, DO   1 drop at 08/01/22 1010       Physical Exam: Vitals:   08/01/22 0717 08/01/22 1010  BP: 133/77 130/79  Pulse: 68 68  Resp: 18   Temp: 98.2 F (36.8 C)   SpO2: 95%    Total I/O In: 360 [P.O.:360] Out: -   Intake/Output Summary (Last 24 hours) at 08/01/2022 1059 Last data filed at 08/01/2022 0758 Gross per 24 hour  Intake 1165.4 ml  Output 1800 ml  Net -634.6 ml   General elderly male in bed in no acute distress    HEENT normocephalic atraumatic extraocular movements intact sclera anicteric Neck supple trachea midline Lungs clear to auscultation bilaterally normal work of breathing at rest on room air  Heart S1S2 no rub Abdomen soft nontender nondistended Extremities no edema lower legs; 1+ edema appreciated in hips  Psych normal mood and affect Neuro - alert and oriented to person, year, and location; conversant; follows commands and provides history. GU - Purewick in place   Test Results I personally reviewed new and old clinical labs and radiology tests Lab Results  Component Value Date   NA 122 (L) 08/01/2022   K 3.2 (L) 08/01/2022   CL 79 (L) 08/01/2022   CO2 30 08/01/2022   BUN 43 (H) 08/01/2022   CREATININE 4.16 (H) 08/01/2022   GFR 19.64 (L) 12/31/2021   GLU 114 06/19/2022   CALCIUM 8.4 (L) 08/01/2022   ALBUMIN 2.7 (L) 08/01/2022   PHOS 2.7 08/01/2022    CBC Recent Labs  Lab 07/27/22 0101 07/29/22 0109  WBC 9.7 8.6  HGB 7.4* 7.6*  HCT 22.8* 23.3*  MCV 86.7 85.7  PLT 219 195   Claudia Desanctis, MD 11:24 AM 08/01/2022

## 2022-08-01 NOTE — Progress Notes (Addendum)
Progress Note   Patient: Rick [name] [id number] DOB: 03-18-1929 DOA: 07/23/2022     8 DOS: the patient was seen and examined on 08/01/2022   Brief hospital course: The patient is a 89 yr old man with stage IV CKD. He presents with anasarca and shortness of breath. He is found to have pulmonary edema, anasarca, and pleural effusions. He was given 120 mg Lasix in the ED with 200 cc output over 12 hours. Nephrology has been consulted and wishes to continue attempts to aggressively diurese. They state that the patient will need to be set up for HD access as outpatient. The patient states that he wishes to go on dialysis despite advice to the contrary.   Dr. Joylene Grapes has evaluated the patient. He recommends continued aggressive diuresis with 120 mg lasix tid. He recommends the addition of metolazone for decreased urinary output. Echocardiogram has been ordered to re-evaluate the patient's cardiac function. Most recent echo demonstrated an EF of 30% on 06/01/22.   02/22 volume status has improved, continue to have hyponatremia.  02.23 considered refractive to tolvaptan and now started on IV 3% saline, transferred to ICU (will stay in [facility] service).   Assessment and Plan: Acute on chronic combined systolic and diastolic CHF (congestive heart failure) (HCC) Echocardiogram with reduced LV systolic function EF 30 to 35%, with global hypokinesis. Mild LVH, E-A fusion, RV systolic function preserved, LA with moderate dilatation. Small pericardial effusion. Moderate to severe mitral regurgitation, moderate aortic regurgitation.  Urine output is  123XX123 cc Systolic blood pressure is 109 to 130 mmHg.   Patient had IV furosemide and  IV albumin. Continue bidil and metoprolol.  Tolvaptan x2 doses.  Holding diuretic therapy for now.   Stage 4 chronic kidney disease (HCC) Polycystic renal disease.  Non oliguric acute renal failure. Hyponatremia   Improved volume status.  Continue to have hyponatremia  despite fluid restriction and 2 doses of tolvaptan. Patient has been placed on hypertonic saline infusion and transferred to the ICU.   Continue q2 hr BMP per protocol. Patient will stay in [facility] service, case discussed with critical care.   Anemia of chronic renal disease.  Stable hgb, continue with EPO.   Essential hypertension Continue blood pressure control with bidil and metoprolol.   Hypothyroidism Continue with levothyroxine.   Acute metabolic encephalopathy With delirium.   Mentation has improved. Continue supportive medical therapy.         Subjective: patient with no chest pain or dyspnea, no nausea or vomiting, continue very weak and deconditioned.   Physical Exam: Vitals:   08/01/22 0311 08/01/22 0717 08/01/22 1010 08/01/22 1144  BP: 124/74 133/77 130/79 109/70  Pulse:  68 68 67  Resp: '20 18  18  '$ Temp: (!) 97.4 F (36.3 C) 98.2 F (36.8 C)  97.7 F (36.5 C)  TempSrc: Oral Oral  Axillary  SpO2:  95%  90%  Weight: 64 kg     Height:       Neurology somnolent but easy to arouse,  ENT with no pallor Cardiovascular with S1 and S2 present and rhythmic Respiratory with no rales or wheezing Abdomen with no distention  Data Reviewed:    Family Communication: I spoke with patient's daughter at the bedside, we talked in detail about patient's condition, plan of care and prognosis and all questions were addressed.   Disposition: Status is: Inpatient Remains inpatient appropriate because: IV hypertonic saline   Planned Discharge Destination: Home    Critical care time 45 minutes.  Author: Tawni Millers, MD 08/01/2022 1:47 PM  For on call review [email or web address].

## 2022-08-01 NOTE — Plan of Care (Signed)
  Problem: Education: Goal: Knowledge of General Education information will improve Description: Including pain rating scale, medication(s)/side effects and non-pharmacologic comfort measures Outcome: Progressing   Problem: Health Behavior/Discharge Planning: Goal: Ability to manage health-related needs will improve Outcome: Progressing   Problem: Clinical Measurements: Goal: Ability to maintain clinical measurements within normal limits will improve Outcome: Progressing Goal: Will remain free from infection Outcome: Progressing   Problem: Nutrition: Goal: Adequate nutrition will be maintained Outcome: Progressing   Problem: Coping: Goal: Level of anxiety will decrease Outcome: Progressing   Problem: Elimination: Goal: Will not experience complications related to bowel motility Outcome: Progressing Goal: Will not experience complications related to urinary retention Outcome: Progressing   Problem: Pain Managment: Goal: General experience of comfort will improve Outcome: Progressing   Problem: Safety: Goal: Ability to remain free from injury will improve Outcome: Progressing

## 2022-08-02 DIAGNOSIS — G9341 Metabolic encephalopathy: Secondary | ICD-10-CM | POA: Diagnosis not present

## 2022-08-02 DIAGNOSIS — N184 Chronic kidney disease, stage 4 (severe): Secondary | ICD-10-CM | POA: Diagnosis not present

## 2022-08-02 DIAGNOSIS — I10 Essential (primary) hypertension: Secondary | ICD-10-CM | POA: Diagnosis not present

## 2022-08-02 DIAGNOSIS — I5043 Acute on chronic combined systolic (congestive) and diastolic (congestive) heart failure: Secondary | ICD-10-CM | POA: Diagnosis not present

## 2022-08-02 LAB — RENAL FUNCTION PANEL
Albumin: 2.3 g/dL — ABNORMAL LOW (ref 3.5–5.0)
Anion gap: 12 (ref 5–15)
BUN: 44 mg/dL — ABNORMAL HIGH (ref 8–23)
CO2: 29 mmol/L (ref 22–32)
Calcium: 8.2 mg/dL — ABNORMAL LOW (ref 8.9–10.3)
Chloride: 85 mmol/L — ABNORMAL LOW (ref 98–111)
Creatinine, Ser: 3.95 mg/dL — ABNORMAL HIGH (ref 0.61–1.24)
GFR, Estimated: 14 mL/min — ABNORMAL LOW (ref >60)
Glucose, Bld: 107 mg/dL — ABNORMAL HIGH (ref 70–99)
Phosphorus: 2.3 mg/dL — ABNORMAL LOW (ref 2.5–4.6)
Potassium: 3.5 mmol/L (ref 3.5–5.1)
Sodium: 126 mmol/L — ABNORMAL LOW (ref 135–145)

## 2022-08-02 LAB — HEPATIC FUNCTION PANEL
ALT: 8 U/L (ref 0–44)
AST: 18 U/L (ref 15–41)
Albumin: 2.4 g/dL — ABNORMAL LOW (ref 3.5–5.0)
Alkaline Phosphatase: 64 U/L (ref 38–126)
Bilirubin, Direct: 0.2 mg/dL (ref 0.0–0.2)
Indirect Bilirubin: 0.6 mg/dL (ref 0.3–0.9)
Total Bilirubin: 0.8 mg/dL (ref 0.3–1.2)
Total Protein: 6.8 g/dL (ref 6.5–8.1)

## 2022-08-02 LAB — CBC
HCT: 25.3 % — ABNORMAL LOW (ref 39.0–52.0)
Hemoglobin: 8.5 g/dL — ABNORMAL LOW (ref 13.0–17.0)
MCH: 28.9 pg (ref 26.0–34.0)
MCHC: 33.6 g/dL (ref 30.0–36.0)
MCV: 86.1 fL (ref 80.0–100.0)
Platelets: 163 10*3/uL (ref 150–400)
RBC: 2.94 MIL/uL — ABNORMAL LOW (ref 4.22–5.81)
RDW: 18.6 % — ABNORMAL HIGH (ref 11.5–15.5)
WBC: 7.3 10*3/uL (ref 4.0–10.5)
nRBC: 0 % (ref 0.0–0.2)

## 2022-08-02 LAB — SODIUM
Sodium: 126 mmol/L — ABNORMAL LOW (ref 135–145)
Sodium: 129 mmol/L — ABNORMAL LOW (ref 135–145)
Sodium: 127 mmol/L — ABNORMAL LOW (ref 135–145)
Sodium: 126 mmol/L — ABNORMAL LOW (ref 135–145)
Sodium: 126 mmol/L — ABNORMAL LOW (ref 135–145)
Sodium: 128 mmol/L — ABNORMAL LOW (ref 135–145)

## 2022-08-02 LAB — GLUCOSE, CAPILLARY: Glucose-Capillary: 115 mg/dL — ABNORMAL HIGH (ref 70–99)

## 2022-08-02 MED ORDER — IPRATROPIUM-ALBUTEROL 0.5-2.5 (3) MG/3ML IN SOLN
3.0000 mL | RESPIRATORY_TRACT | Status: DC | PRN
  Filled 2022-08-02: qty 3

## 2022-08-02 MED ORDER — SODIUM PHOSPHATES 45 MMOLE/15ML IV SOLN
15.0000 mmol | Freq: Once | INTRAVENOUS | Status: AC
  Administered 2022-08-02: 15 mmol via INTRAVENOUS
  Filled 2022-08-02: qty 5

## 2022-08-02 MED ORDER — SODIUM CHLORIDE 3 % IV SOLN
INTRAVENOUS | Status: DC
  Filled 2022-08-02 (×2): qty 500

## 2022-08-02 NOTE — Progress Notes (Signed)
Nephrology Follow-Up Consult note   Assessment/Recommendations: Rick [name] is a/an 89 y.o. male with a past medical history significant for CKD 4, CHF, severe MR, admitted for acute on chronic heart failure exacerbation.     Non-Oliguric AKI on CKD IV: BL Crt ~3.5. Possible cardiorenal syndrome - optimize volume status.  He would need a graft for HD per Dr. Claretha Cooper clinic note - he is 89 years old.  Given his age I am concerned about his candidacy for and tolerance of dialysis and we are hoping to optimize with medical measures.   - Renal function is improving slightly thankfully   Hyponatremia:  - Secondary to fluid volume overload initially as well as CKD.  With his hypoalbuminemia some of volume is not intravascular.  He has had significant weight loss.  Hoping to optimize without requiring dialysis.  s/p tolvaptan 7.5 mg once on 2/20 - minimal increase in sodium and then tolvaptan 15 mg once on 2/22 with no improvement - Continue 3% saline  - next sodium is due now - is ordered every 4 hours  Chronic heart failure exacerbation:  - Fluid status is much improved - 07/24/22 weight of 81.8 kg and 07/30/22 weight of 72 kg.  Next check is 64 kg on 2/23 which seems off but trending down   Iron deficiency/CKD anemia: s/p IV iron and on ESA.  Increased dose to 100 mcg weekly on Saturdays. CBC in AM  Hypothyroidism: on Synthroid  Hypoalbuminemia: optimize nutrition per primary team - at this time doesn't have potassium or phosphorus restrictions.    Delirium:  Improved per charting.  Team has recommended delirium precautions.  Note that he does not carry a diagnosis of dementia   Hypophosphatemia  - repleted phosphorus  Disposition - continue inpatient monitoring   ___________________________________________________________  CC: shortness of breath   Interval History/Subjective: he had 750 mL UOP over 2/23.  He was moved to the ICU for 3% saline yesterday.  He has been on 3% saline at  30 ml/hr.  Sodium is up to 126.  We have also given minibolus of normal saline in the interim.  He has had a good night per RN.    Review of systems:    He denies shortness of breath  Denies n/v  Making good urine output  Medications:  Current Facility-Administered Medications  Medication Dose Route Frequency Provider Last Rate Last Admin   acetaminophen (TYLENOL) tablet 650 mg  650 mg Oral Q6H PRN Etta Quill, DO   650 mg at 07/27/22 2130   Or   acetaminophen (TYLENOL) suppository 650 mg  650 mg Rectal Q6H PRN Etta Quill, DO       albuterol (PROVENTIL) (2.5 MG/3ML) 0.083% nebulizer solution 2.5 mg  2.5 mg Nebulization Q2H PRN Swayze, Ava, DO   2.5 mg at 07/26/22 0442   Chlorhexidine Gluconate Cloth 2 % PADS 6 each  6 each Topical Daily Tawni Millers, MD   6 each at 08/02/22 0501   Darbepoetin Alfa (ARANESP) injection 100 mcg  100 mcg Subcutaneous Q Sat-1800 Harrie Jeans C, MD       ferrous sulfate tablet 325 mg  325 mg Oral Q breakfast Etta Quill, DO   325 mg at 08/01/22 1009   heparin injection 5,000 Units  5,000 Units Subcutaneous Q8H Jennette Kettle M, DO   5,000 Units at 08/02/22 0500   isosorbide-hydrALAZINE (BIDIL) 20-37.5 MG per tablet 1 tablet  1 tablet Oral BID Etta Quill, DO  1 tablet at 08/01/22 2123   latanoprost (XALATAN) 0.005 % ophthalmic solution 1 drop  1 drop Both Eyes QHS Etta Quill, DO   1 drop at 08/01/22 2121   levothyroxine (SYNTHROID) tablet 50 mcg  50 mcg Oral Q0600 Etta Quill, DO   50 mcg at 08/02/22 0500   metoprolol succinate (TOPROL-XL) 24 hr tablet 12.5 mg  12.5 mg Oral Daily Jennette Kettle M, DO   12.5 mg at 08/01/22 1010   Oral care mouth rinse  15 mL Mouth Rinse PRN Arrien, Jimmy Picket, MD       pantoprazole (PROTONIX) EC tablet 40 mg  40 mg Oral Daily Jennette Kettle M, DO   40 mg at 08/01/22 1009   polyethylene glycol (MIRALAX / GLYCOLAX) packet 17 g  17 g Oral Daily Swayze, Ava, DO   17 g at 08/01/22 1008    sodium chloride (hypertonic) 3 % solution   Intravenous Continuous Claudia Desanctis, MD 30 mL/hr at 08/02/22 0600 Infusion Verify at 08/02/22 0600   tamsulosin (FLOMAX) capsule 0.4 mg  0.4 mg Oral Daily Jennette Kettle M, DO   0.4 mg at 08/01/22 1009   timolol (TIMOPTIC) 0.5 % ophthalmic solution 1 drop  1 drop Both Eyes BID Etta Quill, DO   1 drop at 08/01/22 2121      Physical Exam: Vitals:   08/02/22 0500 08/02/22 0600  BP: (!) 143/85 129/69  Pulse: 70 62  Resp: 17 14  Temp:    SpO2: 95% 95%   Total I/O In: 629.2 [I.V.:321.5; IV Piggyback:307.7] Out: -   Intake/Output Summary (Last 24 hours) at 08/02/2022 [id number] Last data filed at 08/02/2022 0600 Gross per 24 hour  Intake 1666.26 ml  Output 750 ml  Net 916.26 ml   General elderly male in bed in no acute distress    HEENT normocephalic atraumatic extraocular movements intact sclera anicteric Neck supple trachea midline Lungs clear to auscultation bilaterally normal work of breathing at rest on room air  Heart S1S2 no rub Abdomen soft nontender distended and obese habitus  Extremities no edema lower legs or hips  Psych normal mood and affect Neuro - alert and oriented to person, year, and location; conversant; follows commands and provides history. GU - no foley   Test Results I personally reviewed new and old clinical labs and radiology tests Lab Results  Component Value Date   NA 126 (L) 08/02/2022   NA 126 (L) 08/02/2022   K 3.5 08/02/2022   CL 85 (L) 08/02/2022   CO2 29 08/02/2022   BUN 44 (H) 08/02/2022   CREATININE 3.95 (H) 08/02/2022   GFR 19.64 (L) 12/31/2021   GLU 114 06/19/2022   CALCIUM 8.2 (L) 08/02/2022   ALBUMIN 2.3 (L) 08/02/2022   PHOS 2.3 (L) 08/02/2022    CBC Recent Labs  Lab 07/27/22 0101 07/29/22 0109  WBC 9.7 8.6  HGB 7.4* 7.6*  HCT 22.8* 23.3*  MCV 86.7 85.7  PLT 219 195   Claudia Desanctis, MD 6:44 AM 08/02/2022

## 2022-08-02 NOTE — Progress Notes (Signed)
MD made aware of pts increase in expiratory wheezing. MD order for neb treatment

## 2022-08-02 NOTE — Progress Notes (Signed)
Pt pulled IV and no access to start 3% NS.  IV team consult in

## 2022-08-02 NOTE — Progress Notes (Signed)
Progress Note   Patient: Rick [name] [id number] DOB: 1928-10-20 DOA: 07/23/2022     9 DOS: the patient was seen and examined on 08/02/2022   Brief hospital course: The patient is a 89 yr old man with stage IV CKD. He presents with anasarca and shortness of breath. He is found to have pulmonary edema, anasarca, and pleural effusions. He was given 120 mg Lasix in the ED with 200 cc output over 12 hours. Nephrology has been consulted and wishes to continue attempts to aggressively diurese. They state that the patient will need to be set up for HD access as outpatient. The patient states that he wishes to go on dialysis despite advice to the contrary.   Dr. Joylene Grapes has evaluated the patient. He recommends continued aggressive diuresis with 120 mg lasix tid. He recommends the addition of metolazone for decreased urinary output. Echocardiogram has been ordered to re-evaluate the patient's cardiac function. Most recent echo demonstrated an EF of 30% on 06/01/22.   02/22 volume status has improved, continue to have hyponatremia.  02.23 considered refractive to tolvaptan and now started on IV 3% saline, transferred to ICU (will stay in [facility] service).  02/24 Na up to 129, with improvement in mental status.   Assessment and Plan: Acute on chronic combined systolic and diastolic CHF (congestive heart failure) (HCC) Echocardiogram with reduced LV systolic function EF 30 to 35%, with global hypokinesis. Mild LVH, E-A fusion, RV systolic function preserved, LA with moderate dilatation. Small pericardial effusion. Moderate to severe mitral regurgitation, moderate aortic regurgitation.  Urine output is  0000000 cc Systolic blood pressure is 129 to 109 mmHg.   Patient had IV furosemide and  IV albumin. Continue bidil and metoprolol.  Tolvaptan x2 doses.  Continue to hold on diuretic therapy for now.   Stage 4 chronic kidney disease (HCC) Polycystic renal disease.  Non oliguric acute renal  failure. Hyponatremia hypophosphatemia    Serum Na has improved this am is up to 129, Patient is tolerating po well, and his mentation has improved.  Will hold on hypertonic saline for now and will re check Na in 4 hrs.  Continue P repletion. Serum cr is 3,9 this am.   Anemia of chronic renal disease.  Stable hgb, continue with EPO.   Essential hypertension Continue blood pressure control with bidil and metoprolol.   Hypothyroidism Continue with levothyroxine.   Acute metabolic encephalopathy With delirium.   Mentation has improved. Continue supportive medical therapy.         Subjective: Patient is more awake and alert, no dyspnea or edema, he is tolerating po well.   Physical Exam: Vitals:   08/02/22 0500 08/02/22 0600 08/02/22 0700 08/02/22 0800  BP: (!) 143/85 129/69 109/73 107/81  Pulse: 70 62 64 64  Resp: '17 14 20 '$ (!) 23  Temp:   98.6 F (37 C)   TempSrc:   Axillary   SpO2: 95% 95% 98% 96%  Weight:      Height:       Neurology awake and alert ENT with mild pallor Cardiovascular with S1 and S2 present and rhythmic with no gallops, rubs or murmurs Respiratory with no rales or wheezing, no rhonchi Abdomen with no distention No lower extremity edema  Data Reviewed:    Family Communication: I spoke with patient's daughter at the bedside, we talked in detail about patient's condition, plan of care and prognosis and all questions were addressed.   Disposition: Status is: Inpatient Remains inpatient appropriate because: Hyponatremia  and renal failure   Planned Discharge Destination: Home      Author: Tawni Millers, MD 08/02/2022 8:33 AM  For on call review [email or web address].

## 2022-08-02 NOTE — Plan of Care (Signed)
Resume 3% saline.  Sodium is dropping again.    Stop hypertonic saline once sodium is 131 or greater.    Please page or call nephrology on call if sodium rises above 134 - communication order placed.  Check sodium every 4 hours   Claudia Desanctis, MD 4:40 PM 08/02/2022

## 2022-08-03 DIAGNOSIS — G9341 Metabolic encephalopathy: Secondary | ICD-10-CM | POA: Diagnosis not present

## 2022-08-03 DIAGNOSIS — N184 Chronic kidney disease, stage 4 (severe): Secondary | ICD-10-CM | POA: Diagnosis not present

## 2022-08-03 DIAGNOSIS — I5043 Acute on chronic combined systolic (congestive) and diastolic (congestive) heart failure: Secondary | ICD-10-CM | POA: Diagnosis not present

## 2022-08-03 DIAGNOSIS — I10 Essential (primary) hypertension: Secondary | ICD-10-CM | POA: Diagnosis not present

## 2022-08-03 LAB — CBC
HCT: 21.3 % — ABNORMAL LOW (ref 39.0–52.0)
Hemoglobin: 7.1 g/dL — ABNORMAL LOW (ref 13.0–17.0)
MCH: 29.1 pg (ref 26.0–34.0)
MCHC: 33.3 g/dL (ref 30.0–36.0)
MCV: 87.3 fL (ref 80.0–100.0)
Platelets: 184 10*3/uL (ref 150–400)
RBC: 2.44 MIL/uL — ABNORMAL LOW (ref 4.22–5.81)
RDW: 19 % — ABNORMAL HIGH (ref 11.5–15.5)
WBC: 8.3 10*3/uL (ref 4.0–10.5)
nRBC: 0 % (ref 0.0–0.2)

## 2022-08-03 LAB — RENAL FUNCTION PANEL
Albumin: 2.4 g/dL — ABNORMAL LOW (ref 3.5–5.0)
Anion gap: 16 — ABNORMAL HIGH (ref 5–15)
BUN: 50 mg/dL — ABNORMAL HIGH (ref 8–23)
CO2: 24 mmol/L (ref 22–32)
Calcium: 8.4 mg/dL — ABNORMAL LOW (ref 8.9–10.3)
Chloride: 89 mmol/L — ABNORMAL LOW (ref 98–111)
Creatinine, Ser: 3.88 mg/dL — ABNORMAL HIGH (ref 0.61–1.24)
GFR, Estimated: 14 mL/min — ABNORMAL LOW (ref >60)
Glucose, Bld: 105 mg/dL — ABNORMAL HIGH (ref 70–99)
Phosphorus: 3.2 mg/dL (ref 2.5–4.6)
Potassium: 3.5 mmol/L (ref 3.5–5.1)
Sodium: 129 mmol/L — ABNORMAL LOW (ref 135–145)

## 2022-08-03 LAB — PREPARE RBC (CROSSMATCH)

## 2022-08-03 LAB — SODIUM
Sodium: 127 mmol/L — ABNORMAL LOW (ref 135–145)
Sodium: 130 mmol/L — ABNORMAL LOW (ref 135–145)
Sodium: 128 mmol/L — ABNORMAL LOW (ref 135–145)

## 2022-08-03 LAB — HEMOGLOBIN AND HEMATOCRIT, BLOOD
HCT: 23.9 % — ABNORMAL LOW (ref 39.0–52.0)
Hemoglobin: 8.2 g/dL — ABNORMAL LOW (ref 13.0–17.0)

## 2022-08-03 LAB — GLUCOSE, CAPILLARY: Glucose-Capillary: 112 mg/dL — ABNORMAL HIGH (ref 70–99)

## 2022-08-03 MED ORDER — FUROSEMIDE 10 MG/ML IJ SOLN
80.0000 mg | Freq: Once | INTRAMUSCULAR | Status: AC
  Administered 2022-08-03: 80 mg via INTRAVENOUS
  Filled 2022-08-03: qty 8

## 2022-08-03 MED ORDER — SODIUM CHLORIDE 0.9% IV SOLUTION: Freq: Once | INTRAVENOUS | Status: AC

## 2022-08-03 NOTE — Progress Notes (Addendum)
Progress Note   Patient: Rick [name] [id number] DOB: 20-Jul-1928 DOA: 07/23/2022     10 DOS: the patient was seen and examined on 08/03/2022   Brief hospital course: The patient is a 89 yr old man with stage IV CKD. He presents with anasarca and shortness of breath. He is found to have pulmonary edema, anasarca, and pleural effusions. He was given 120 mg Lasix in the ED with 200 cc output over 12 hours. Nephrology has been consulted and wishes to continue attempts to aggressively diurese. They state that the patient will need to be set up for HD access as outpatient. The patient states that he wishes to go on dialysis despite advice to the contrary.   Dr. Joylene Grapes has evaluated the patient. He recommends continued aggressive diuresis with 120 mg lasix tid. He recommends the addition of metolazone for decreased urinary output. Echocardiogram has been ordered to re-evaluate the patient's cardiac function. Most recent echo demonstrated an EF of 30% on 06/01/22.   02/22 volume status has improved, continue to have hyponatremia.  02.23 considered refractive to tolvaptan and now started on IV 3% saline, transferred to ICU (will stay in [facility] service).  02/24 Na up to 129, with improvement in mental status.  02/25 improved Na with hypertonic saline.   Assessment and Plan: Acute on chronic combined systolic and diastolic CHF (congestive heart failure) (HCC) Echocardiogram with reduced LV systolic function EF 30 to 35%, with global hypokinesis. Mild LVH, E-A fusion, RV systolic function preserved, LA with moderate dilatation. Small pericardial effusion. Moderate to severe mitral regurgitation, moderate aortic regurgitation.  Urine output is  123XX123 cc Systolic blood pressure is 126 to 128  mmHg.   Patient had IV furosemide and  IV albumin. Continue bidil and metoprolol.  Tolvaptan x2 doses.  Resumed IV furosemide today due to sings of volume overload after hypertonic saline infusion.   Stage 4  chronic kidney disease (HCC) Polycystic renal disease.  Non oliguric acute renal failure. Hyponatremia hypophosphatemia    Patient required furosemide for hypervolemia today. Renal function with serum cr at 3,8 with K at 3,5 and serum bicarbonate at 24. P is 2,4  Na is up to 130 last check, off hypertonic saline since 6:00 am today.   Continue with regular diet with no salt restriction.  Follow up Na at 15:00 if continue 130 range plan to transfer back to medical ward.  Follow up renal panel in am.   Anemia of chronic renal disease.  Continue with EPO. Will give PRBC transfusion today   Essential hypertension Continue blood pressure control with bidil and metoprolol.   Hypothyroidism Continue with levothyroxine.   Acute metabolic encephalopathy With delirium.   Mentation has improved. Continue supportive medical therapy.         Subjective: Patient is feeling better, at the time of my examination denies dyspnea or chest pain, he has been tolerating po well.   Physical Exam: Vitals:   08/03/22 1000 08/03/22 1014 08/03/22 1100 08/03/22 1228  BP: 126/83 135/70 126/68   Pulse: 84 78 71   Resp: 20  14   Temp:    98.9 F (37.2 C)  TempSrc:    Axillary  SpO2: 96%  94%   Weight:      Height:       Neurology awake and alert ENT with mild pallor Cardiovascular with S1 and S2 present and rhythmic with no gallops, rubs or murmurs No JVD Respiratory with no rales or wheezing Abdomen with no  distention  No lower extremity edema  Data Reviewed:    Family Communication: I spoke with patient's family at the bedside, we talked in detail about patient's condition, plan of care and prognosis and all questions were addressed.   Disposition: Status is: Inpatient Remains inpatient appropriate because: renal failure and hyponatremia   Planned Discharge Destination: Home  Author: Tawni Millers, MD 08/03/2022 1:03 PM  For on call review [email or web address].

## 2022-08-03 NOTE — Progress Notes (Signed)
Nephrology Follow-Up Consult note   Assessment/Recommendations: Rick [name] is a/an 89 y.o. male with a past medical history significant for CKD 4, CHF, severe MR, admitted for acute on chronic heart failure exacerbation.     Non-Oliguric AKI on CKD IV: BL Crt ~3.5. Possible cardiorenal syndrome - optimize volume status.  He would need a graft for HD per Dr. Claretha Cooper clinic note - he is 89 years old.  Given his age I am concerned about his candidacy for and tolerance of dialysis and we are hoping to optimize with medical measures.   - I have ordered a renal panel for today and tomorrow  Hyponatremia:  - Secondary to fluid volume overload initially as well as CKD.  With his hypoalbuminemia some of volume is not intravascular.  He has had significant weight loss.  Hoping to optimize without requiring dialysis.  s/p tolvaptan 7.5 mg once on 2/20 - minimal increase in sodium and then tolvaptan 15 mg once on 2/22 with no improvement - Stop 3% saline   - Check sodium every 4 hours; have d/c'd hourly neuro checks and note he is getting neuro checks every 4 hours in the ICU already at this time  - Lasix 80 mg IV once now - Await his repeat labs x 2 today and if stable may be able to be transferred out of the ICU.  Na 127 currently   Chronic heart failure exacerbation:  - Fluid status is much improved - 07/24/22 weight of 81.8 kg and 07/30/22 weight of 72 kg.  Next checks are 64 kg on 2/23 and 62.2 kg on 2/25 which seem off but are trending down and consistent - Given his wheezing I have stopped 3% saline and given lasix this AM.  Note aggressive diuresis earlier this admission and scheduled diuretics will need to be resumed soon  Iron deficiency/CKD anemia: s/p IV iron and on ESA.  Increased dose to 100 mcg weekly on Saturdays. CBC in AM  Hypothyroidism: on Synthroid  Hypoalbuminemia: optimize nutrition per primary team - at this time doesn't have potassium or phosphorus restrictions.    Delirium:   Improved per charting.  Team has recommended delirium precautions.  Note that he does not carry a diagnosis of dementia   Hypophosphatemia  - s/p repletion  Disposition - continue inpatient monitoring   ___________________________________________________________  CC: shortness of breath   Interval History/Subjective: he had 900 mL UOP over 2/24.  His 3% saline was stopped yesterday AM and later resumed yesterday afternoon after sodium dropped off the 3%.  Spoke with RN.  He reported an increase in expiratory wheezing yesterday and was given a neb; had another overnight with improvement.  He states sometimes happens at home, too   Review of systems:     He denies shortness of breath currently but has been wheezing  Denies n/v  Making good urine output Per RN has had hourly neuro checks and sometimes has been confused as to year but reorients and otherwise ok  Medications:  Current Facility-Administered Medications  Medication Dose Route Frequency Provider Last Rate Last Admin   acetaminophen (TYLENOL) tablet 650 mg  650 mg Oral Q6H PRN Etta Quill, DO   650 mg at 07/27/22 2130   Or   acetaminophen (TYLENOL) suppository 650 mg  650 mg Rectal Q6H PRN Etta Quill, DO       albuterol (PROVENTIL) (2.5 MG/3ML) 0.083% nebulizer solution 2.5 mg  2.5 mg Nebulization Q2H PRN Swayze, Ava, DO  2.5 mg at 08/03/22 [id number]   Chlorhexidine Gluconate Cloth 2 % PADS 6 each  6 each Topical Daily Arrien, Jimmy Picket, MD   6 each at 08/02/22 0501   Darbepoetin Alfa (ARANESP) injection 100 mcg  100 mcg Subcutaneous Q Sat-1800 Claudia Desanctis, MD   100 mcg at 08/02/22 2019   ferrous sulfate tablet 325 mg  325 mg Oral Q breakfast Etta Quill, DO   325 mg at 08/02/22 [id number]   heparin injection 5,000 Units  5,000 Units Subcutaneous Q8H Etta Quill, DO   5,000 Units at 08/03/22 0507   ipratropium-albuterol (DUONEB) 0.5-2.5 (3) MG/3ML nebulizer solution 3 mL  3 mL Nebulization Q4H PRN Arrien,  Jimmy Picket, MD       isosorbide-hydrALAZINE (BIDIL) 20-37.5 MG per tablet 1 tablet  1 tablet Oral BID Etta Quill, DO   1 tablet at 08/02/22 2146   latanoprost (XALATAN) 0.005 % ophthalmic solution 1 drop  1 drop Both Eyes QHS Etta Quill, DO   1 drop at 08/02/22 2146   levothyroxine (SYNTHROID) tablet 50 mcg  50 mcg Oral Q0600 Etta Quill, DO   50 mcg at 08/03/22 [id number]   metoprolol succinate (TOPROL-XL) 24 hr tablet 12.5 mg  12.5 mg Oral Daily Jennette Kettle M, DO   12.5 mg at 08/02/22 1010   Oral care mouth rinse  15 mL Mouth Rinse PRN Arrien, Jimmy Picket, MD       pantoprazole (PROTONIX) EC tablet 40 mg  40 mg Oral Daily Jennette Kettle M, DO   40 mg at 08/02/22 1008   polyethylene glycol (MIRALAX / GLYCOLAX) packet 17 g  17 g Oral Daily Swayze, Ava, DO   17 g at 08/02/22 1008   sodium chloride (hypertonic) 3 % solution   Intravenous Continuous Claudia Desanctis, MD 30 mL/hr at 08/03/22 0500 Infusion Verify at 08/03/22 0500   tamsulosin (FLOMAX) capsule 0.4 mg  0.4 mg Oral Daily Jennette Kettle M, DO   0.4 mg at 08/02/22 1009   timolol (TIMOPTIC) 0.5 % ophthalmic solution 1 drop  1 drop Both Eyes BID Etta Quill, DO   1 drop at 08/02/22 2146      Physical Exam: Vitals:   08/03/22 0530 08/03/22 0600  BP:  127/72  Pulse: 81 78  Resp: (!) 21 16  Temp:    SpO2: 94% 90%   Total I/O In: 308.3 [I.V.:308.3] Out: 500 [Urine:500]  Intake/Output Summary (Last 24 hours) at 08/03/2022 0640 Last data filed at 08/03/2022 0500 Gross per 24 hour  Intake 629.12 ml  Output 900 ml  Net -270.88 ml   General elderly male in bed in no acute distress   HEENT normocephalic atraumatic extraocular movements intact sclera anicteric Neck supple trachea midline Lungs occ exp wheeze on auscultation bilaterally normal work of breathing at rest with increased work of breathing with exertion; on room air  Heart S1S2 no rub Abdomen soft nontender distended and obese habitus  Extremities  no edema lower legs or hips  Psych normal mood and affect Neuro - alert and oriented to person and location; conversant; follows commands and provides history. GU - no foley   Test Results I personally reviewed new and old clinical labs and radiology tests Lab Results  Component Value Date   NA 127 (L) 08/03/2022   K 3.5 08/02/2022   CL 85 (L) 08/02/2022   CO2 29 08/02/2022   BUN 44 (H) 08/02/2022   CREATININE 3.95 (H) 08/02/2022  GFR 19.64 (L) 12/31/2021   GLU 114 06/19/2022   CALCIUM 8.2 (L) 08/02/2022   ALBUMIN 2.4 (L) 08/02/2022   PHOS 2.3 (L) 08/02/2022    CBC Recent Labs  Lab 07/29/22 0109 08/02/22 0646  WBC 8.6 7.3  HGB 7.6* 8.5*  HCT 23.3* 25.3*  MCV 85.7 86.1  PLT 195 163   Claudia Desanctis, MD 7:01 AM 08/03/2022

## 2022-08-04 DIAGNOSIS — I5043 Acute on chronic combined systolic (congestive) and diastolic (congestive) heart failure: Secondary | ICD-10-CM | POA: Diagnosis not present

## 2022-08-04 DIAGNOSIS — N184 Chronic kidney disease, stage 4 (severe): Secondary | ICD-10-CM | POA: Diagnosis not present

## 2022-08-04 DIAGNOSIS — I10 Essential (primary) hypertension: Secondary | ICD-10-CM | POA: Diagnosis not present

## 2022-08-04 DIAGNOSIS — G9341 Metabolic encephalopathy: Secondary | ICD-10-CM | POA: Diagnosis not present

## 2022-08-04 LAB — CBC
HCT: 23.8 % — ABNORMAL LOW (ref 39.0–52.0)
Hemoglobin: 8.1 g/dL — ABNORMAL LOW (ref 13.0–17.0)
MCH: 29.3 pg (ref 26.0–34.0)
MCHC: 34 g/dL (ref 30.0–36.0)
MCV: 86.2 fL (ref 80.0–100.0)
Platelets: 177 10*3/uL (ref 150–400)
RBC: 2.76 MIL/uL — ABNORMAL LOW (ref 4.22–5.81)
RDW: 19.2 % — ABNORMAL HIGH (ref 11.5–15.5)
WBC: 13.4 10*3/uL — ABNORMAL HIGH (ref 4.0–10.5)
nRBC: 0 % (ref 0.0–0.2)

## 2022-08-04 LAB — RENAL FUNCTION PANEL
Albumin: 2.4 g/dL — ABNORMAL LOW (ref 3.5–5.0)
Anion gap: 13 (ref 5–15)
BUN: 58 mg/dL — ABNORMAL HIGH (ref 8–23)
CO2: 26 mmol/L (ref 22–32)
Calcium: 8.5 mg/dL — ABNORMAL LOW (ref 8.9–10.3)
Chloride: 89 mmol/L — ABNORMAL LOW (ref 98–111)
Creatinine, Ser: 4.13 mg/dL — ABNORMAL HIGH (ref 0.61–1.24)
GFR, Estimated: 13 mL/min — ABNORMAL LOW (ref >60)
Glucose, Bld: 107 mg/dL — ABNORMAL HIGH (ref 70–99)
Phosphorus: 3 mg/dL (ref 2.5–4.6)
Potassium: 3.6 mmol/L (ref 3.5–5.1)
Sodium: 128 mmol/L — ABNORMAL LOW (ref 135–145)

## 2022-08-04 LAB — BPAM RBC
Blood Product Expiration Date: 202403012359
ISSUE DATE / TIME: 202402251624
Unit Type and Rh: 1700

## 2022-08-04 LAB — TYPE AND SCREEN
ABO/RH(D): B POS
Antibody Screen: NEGATIVE
Unit division: 0

## 2022-08-04 MED ORDER — METOPROLOL SUCCINATE ER 25 MG PO TB24: 12.5000 mg | ORAL_TABLET | Freq: Every day | ORAL | 0 refills | Status: DC

## 2022-08-04 MED ORDER — FUROSEMIDE 40 MG PO TABS
40.0000 mg | ORAL_TABLET | Freq: Every day | ORAL | Status: DC
  Administered 2022-08-04: 40 mg via ORAL
  Filled 2022-08-04: qty 1

## 2022-08-04 NOTE — Progress Notes (Signed)
Heart Failure Navigator Progress Note  Assessed for Heart & Vascular TOC clinic readiness.  Patient EF 30-35% Advanced Kidney disease, with renal failure and hyponatremia, per note palliative consult.  .   Navigator will signoff at this time. Earnestine Leys, BSN, RN Heart Failure Transport planner Only

## 2022-08-04 NOTE — Progress Notes (Signed)
Occupational Therapy Treatment Patient Details Name: Rick [name] MRN: [id number] DOB: Oct 24, 1928 Today's Date: 08/04/2022   History of present illness Rick [name] is a 89 y.o. male admitted 07/23/22 with anascarca and SOB and dx with CHF exacerbation. He is found to have pulmonary edema and pleural effusions. PMH includes CKD 4, severe MR, prostate CA, pelvic fx   OT comments  Pt is making good progress towards their acute OT goals. Upon arrival, pt was resting in bed and agreeable to complete ADLs. Overall he continues to require min A for transfers and mobility with RW with unsteady gait and 2x LOBs. Pt completed ADLs while standing at the sink with min A for UB tasks and cues for initiation, sequencing, attention and safety. OT to continue to follow acutely. Discharge recommendation Snf for continued therapy and safety however, per chart pt's wife is currently declining SNF; pt will benefit from [facility] with 24/7 direct hands on assist for mobility and ADLs.    Recommendations for follow up therapy are one component of a multi-disciplinary discharge planning process, led by the attending physician.  Recommendations may be updated based on patient status, additional functional criteria and insurance authorization.    Follow Up Recommendations  Skilled nursing-short term rehab (<3 hours/day) (pt's wife declining SNF - pt will benefit from [facility] and direct 24/7 hands on assist)     Assistance Recommended at Discharge Frequent or constant Supervision/Assistance  Patient can return home with the following  A little help with walking and/or transfers;A little help with bathing/dressing/bathroom;Assist for transportation;Help with stairs or ramp for entrance   Equipment Recommendations  Tub/shower bench       Precautions / Restrictions Precautions Precautions: Fall Restrictions Weight Bearing Restrictions: No       Mobility Bed Mobility               General bed mobility  comments: OOB in chair upon arrival    Transfers Overall transfer level: Needs assistance Equipment used: Rolling walker (2 wheels) Transfers: Sit to/from Stand Sit to Stand: Min assist           General transfer comment: min A to boost from chiar     Balance Overall balance assessment: Needs assistance Sitting-balance support: Feet supported Sitting balance-Leahy Scale: Fair     Standing balance support: No upper extremity supported, During functional activity Standing balance-Leahy Scale: Fair                             ADL either performed or assessed with clinical judgement   ADL Overall ADL's : Needs assistance/impaired     Grooming: Min guard;Standing Grooming Details (indicate cue type and reason): at the sink                 Toilet Transfer: Minimal assistance;Ambulation;Rolling walker (2 wheels) Toilet Transfer Details (indicate cue type and reason): 2x mild LOBs, min A to correct         Functional mobility during ADLs: Minimal assistance;Cueing for safety;Cueing for sequencing;Rolling walker (2 wheels) General ADL Comments: pt limited by weakness, limited insight, unsteady gait with 2x LOBs and decreased activity tolernace.    Extremity/Trunk Assessment Upper Extremity Assessment Upper Extremity Assessment: Generalized weakness   Lower Extremity Assessment Lower Extremity Assessment: Defer to PT evaluation        Vision   Vision Assessment?: No apparent visual deficits   Perception Perception Perception: Not tested   Praxis Praxis Praxis:  Not tested    Cognition Arousal/Alertness: Awake/alert Behavior During Therapy: Flat affect Overall Cognitive Status: Impaired/Different from baseline Area of Impairment: Following commands, Safety/judgement, Awareness, Problem solving, Memory                     Memory: Decreased short-term memory Following Commands: Follows one step commands consistently Safety/Judgement:  Decreased awareness of safety Awareness: Emergent Problem Solving: Slow processing, Requires verbal cues General Comments: Pt is very cooperative, requires extra time and benefits from cues for sequencing and safety. Could not recall if he had brushed his teeth or not today              General Comments VSS on RA, brother in law present    Pertinent Vitals/ Pain       Pain Assessment Pain Assessment: No/denies pain Pain Intervention(s): Monitored during session   Frequency  Min 2X/week        Progress Toward Goals  OT Goals(current goals can now be found in the care plan section)  Progress towards OT goals: Progressing toward goals  Acute Rehab OT Goals Patient Stated Goal: home soon OT Goal Formulation: With patient Time For Goal Achievement: 08/09/22 Potential to Achieve Goals: Good ADL Goals Pt Will Perform Grooming: with modified independence;sitting Pt Will Perform Upper Body Dressing: with supervision;sitting;with adaptive equipment Pt Will Perform Lower Body Dressing: with supervision;with adaptive equipment;sitting/lateral leans Pt Will Transfer to Toilet: with supervision;ambulating;regular height toilet Pt Will Perform Toileting - Clothing Manipulation and hygiene: sit to/from stand;with min guard assist Pt/caregiver will Perform Home Exercise Program: Both right and left upper extremity;With theraband;With written HEP provided Additional ADL Goal #1: Pt will verbalize at least 3 energy conservation strategies for ADL with no cues  Plan Discharge plan remains appropriate       AM-PAC OT "6 Clicks" Daily Activity     Outcome Measure   Help from another person eating meals?: None Help from another person taking care of personal grooming?: A Little Help from another person toileting, which includes using toliet, bedpan, or urinal?: A Lot Help from another person bathing (including washing, rinsing, drying)?: A Little Help from another person to put on and  taking off regular upper body clothing?: A Little Help from another person to put on and taking off regular lower body clothing?: A Lot 6 Click Score: 17    End of Session Equipment Utilized During Treatment: Rolling walker (2 wheels)  OT Visit Diagnosis: Unsteadiness on feet (R26.81);Other abnormalities of gait and mobility (R26.89);Muscle weakness (generalized) (M62.81);History of falling (Z91.81);Other symptoms and signs involving cognitive function   Activity Tolerance Patient tolerated treatment well   Patient Left in chair;with chair alarm set   Nurse Communication Mobility status        Time: [id number] OT Time Calculation (min): 18 min  Charges: OT General Charges $OT Visit: 1 Visit OT Treatments $Self Care/Home Management : 8-22 mins  Shade Flood, OTR/L [place] Office [phone] Secure Chat Communication Preferred   Elliot Cousin 08/04/2022, 3:04 PM

## 2022-08-04 NOTE — Progress Notes (Addendum)
Physical Therapy Treatment Patient Details Name: Rick [name] MRN: [id number] DOB: July 05, 1928 Today's Date: 08/04/2022   History of Present Illness Rick [name] is a 89 y.o. male admitted 07/23/22 with anascarca and SOB and dx with CHF exacerbation. He is found to have pulmonary edema and pleural effusions. PMH includes CKD 4, severe MR, prostate CA, pelvic fx    PT Comments    Pt endorses eagerness to d/c home. Pt ambulatory in hallway with use of RW and close guard for safety, pt requiring cues for form and safety throughout. Pt limited in activity tolerance given weakness, as well as urinary incontinence once in hallway. Pt's family at bedside, supportive and will assist him at d//c.      Recommendations for follow up therapy are one component of a multi-disciplinary discharge planning process, led by the attending physician.  Recommendations may be updated based on patient status, additional functional criteria and insurance authorization.  Follow Up Recommendations  Skilled nursing-short term rehab (<3 hours/day) - pt and family want d/c home, therefore recommending HHPT Can patient physically be transported by private vehicle: Yes   Assistance Recommended at Discharge Frequent or constant Supervision/Assistance  Patient can return home with the following Assist for transportation;Help with stairs or ramp for entrance;A little help with walking and/or transfers;A little help with bathing/dressing/bathroom   Equipment Recommendations  None recommended by PT    Recommendations for Other Services       Precautions / Restrictions Precautions Precautions: Fall Restrictions Weight Bearing Restrictions: No     Mobility  Bed Mobility               General bed mobility comments: OOB in chair upon arrival    Transfers Overall transfer level: Needs assistance Equipment used: Rolling walker (2 wheels) Transfers: Sit to/from Stand Sit to Stand: Min assist            General transfer comment: assist for rise into standing and slow lower when moving stand>sit    Ambulation/Gait Ambulation/Gait assistance: Min assist Gait Distance (Feet): 120 Feet Assistive device: Rolling walker (2 wheels) Gait Pattern/deviations: Step-through pattern, Decreased stride length, Wide base of support, Trunk flexed Gait velocity: decr     General Gait Details: cues for keeping RW closer to self, upright posture. increased time   Stairs             Wheelchair Mobility    Modified Rankin (Stroke Patients Only)       Balance Overall balance assessment: Needs assistance Sitting-balance support: Feet supported Sitting balance-Leahy Scale: Fair     Standing balance support: No upper extremity supported, During functional activity Standing balance-Leahy Scale: Poor                              Cognition Arousal/Alertness: Awake/alert Behavior During Therapy: Flat affect Overall Cognitive Status: Impaired/Different from baseline Area of Impairment: Following commands, Safety/judgement, Awareness, Problem solving, Memory                     Memory: Decreased short-term memory Following Commands: Follows one step commands consistently Safety/Judgement: Decreased awareness of safety Awareness: Emergent Problem Solving: Slow processing, Requires verbal cues General Comments: pt pleasant, HOH and requires repeated cuing as a result of this and likely safety awareness deficits. Pt incontinent of urine in hallway        Exercises      General Comments General comments (skin integrity, edema,  etc.): VSS on RA, brother in law present      Pertinent Vitals/Pain Pain Assessment Pain Assessment: Faces Faces Pain Scale: No hurt Pain Intervention(s): Monitored during session    Home Living                          Prior Function            PT Goals (current goals can now be found in the care plan section) Acute  Rehab PT Goals Patient Stated Goal: to get walking again PT Goal Formulation: With patient/family Time For Goal Achievement: 08/09/22 Potential to Achieve Goals: Good Progress towards PT goals: Progressing toward goals    Frequency    Min 3X/week      PT Plan Current plan remains appropriate    Co-evaluation              AM-PAC PT "6 Clicks" Mobility   Outcome Measure  Help needed turning from your back to your side while in a flat bed without using bedrails?: A Little Help needed moving from lying on your back to sitting on the side of a flat bed without using bedrails?: A Little Help needed moving to and from a bed to a chair (including a wheelchair)?: A Little Help needed standing up from a chair using your arms (e.g., wheelchair or bedside chair)?: A Little Help needed to walk in hospital room?: A Little Help needed climbing 3-5 steps with a railing? : A Lot 6 Click Score: 17    End of Session   Activity Tolerance: Patient tolerated treatment well Patient left: with call bell/phone within reach;with family/visitor present;with nursing/sitter in room;in chair Nurse Communication: Mobility status PT Visit Diagnosis: Unsteadiness on feet (R26.81);Muscle weakness (generalized) (M62.81);Difficulty in walking, not elsewhere classified (R26.2)     Time: [id number] PT Time Calculation (min) (ACUTE ONLY): 19 min  Charges:  $Gait Training: 8-22 mins                     Stacie Glaze, PT DPT Acute Rehabilitation Services Pager [phone]  Office [phone]    Rick [name] 08/04/2022, 4:42 PM

## 2022-08-04 NOTE — Progress Notes (Signed)
[name] NEPHROLOGY PROGRESS NOTE  Assessment/ Plan: Pt is a 89 y.o. yo male with history of hypertension, CHF, severe MR, CKD 4 admitted with CHF exacerbation.  # Acute kidney injury on CKD stage IV, baseline creatinine around 3.5, possibly due to cardiorenal syndrome.  Responded with diuretics with increased urine output.  He is on room air and respiratory status seems to be stable.  I will hold further IV diuretics however resume home dosage of furosemide. Strict ins and out, daily lab. Given his age, we will try to avoid dialysis and manage/optimize medical measures first.  Recommend palliative care to address goals of care while he is in the hospital.  # Hyponatremia with fluid overload/CKD.  Treated with loop diuretics, tolvaptan and also received 3% saline briefly.  Sodium level is 128 today.  I will continue oral furosemide and fluid restriction and monitor lab.  # Acute on chronic CHF exacerbation treated with diuretics, seems to be well compensated now.  # Anemia due to CKD status post IV iron and erythropoietin.  On oral iron.  Monitor lab.  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  # Hypertension-volume: Currently on cardiac medication including BiDil, metoprolol.  Subjective: Seen and family at bedside.  Urine output recorded around 1.8 L with IV diuretics.  He denies nausea, vomiting, chest pain, shortness of breath.  No dysgeusia.  No new event.  Discussed with ICU nurse. Objective Vital signs in last 24 hours: Vitals:   08/04/22 0600 08/04/22 0700 08/04/22 0757 08/04/22 0800  BP: 130/87 135/86  (!) 116/58  Pulse: 80 76  84  Resp: 20 (!) 22  (!) 23  Temp:   (!) 97 F (36.1 C)   TempSrc:   Axillary   SpO2: 93% 94%  95%  Weight:      Height:       Weight change:   Intake/Output Summary (Last 24 hours) at 08/04/2022 0841 Last data filed at 08/04/2022 0800 Gross per 24 hour  Intake 846.83 ml  Output 1850 ml  Net -1003.17 ml        Labs: RENAL PANEL Recent Labs    10/24/21 0947 10/25/21 0251 06/01/22 0514 06/01/22 1831 07/25/22 2258 07/26/22 0104 07/27/22 0102 07/28/22 0041 07/29/22 0109 07/29/22 0954 07/30/22 0124 07/30/22 1452 07/31/22 0041 07/31/22 1814 08/01/22 0237 08/01/22 1228 08/02/22 0145 08/02/22 0646 08/02/22 1043 08/02/22 1509 08/02/22 1747 08/02/22 2231 08/03/22 0208 08/03/22 0652 08/03/22 1100 08/03/22 1510 08/04/22 0149  NA 128*   < > 127*   < >  --  128* 127* 126* 123*   < > 125*   < > 123*   < > 122*   < > 126*  126* 129* 127* 126* 126* 128* 127* 129* 130* 128* 128*  K 3.8   < > 4.2   < > 4.4 4.3 3.9 3.8 3.5  --  4.0  --  3.6  --  3.2*  --  3.5  --   --   --   --   --   --  3.5  --   --  3.6  CL 96*   < > 90*   < >  --  92* 93* 86* 83*  --  83*  --  79*  --  79*  --  85*  --   --   --   --   --   --  89*  --   --  89*  CO2 25   < >  26   < >  --  '25 24 27 30  '$ --  28  --  31  --  30  --  29  --   --   --   --   --   --  24  --   --  26  GLUCOSE 133*   < > 117*   < >  --  99 102* 109* 91  --  112*  --  100*  --  98  --  107*  --   --   --   --   --   --  105*  --   --  107*  BUN 18   < > 16   < >  --  35* 32* 36* 35*  --  38*  --  41*  --  43*  --  44*  --   --   --   --   --   --  50*  --   --  58*  CREATININE 2.54*   < > 3.21*   < >  --  3.97* 4.07* 4.19* 4.13*  --  4.28*  --  4.27*  --  4.16*  --  3.95*  --   --   --   --   --   --  3.88*  --   --  4.13*  CALCIUM 8.1*   < > 8.1*   < >  --  7.6* 7.6* 7.7* 7.7*  --  8.1*  --  8.2*  --  8.4*  --  8.2*  --   --   --   --   --   --  8.4*  --   --  8.5*  MG 1.9  --  1.7  --  2.1  --   --   --   --   --   --   --  2.0  --   --   --   --   --   --   --   --   --   --   --   --   --   --   PHOS  --    < > 2.4*  --   --  3.5 3.0 2.7 2.1*  --  2.3*  --  2.0*  --  2.7  --  2.3*  --   --   --   --   --   --  3.2  --   --  3.0  ALBUMIN  --    < > 2.0*   < >  --  2.2* 1.9* 1.8* 1.7*  --  1.9*  --  2.1*  --  2.7*  --  2.3* 2.4*  --   --    --   --   --  2.4*  --   --  2.4*   < > = values in this interval not displayed.     Liver Function Tests: Recent Labs  Lab 08/02/22 0646 08/03/22 0652 08/04/22 0149  AST 18  --   --   ALT 8  --   --   ALKPHOS 64  --   --   BILITOT 0.8  --   --   PROT 6.8  --   --   ALBUMIN 2.4* 2.4* 2.4*   No results for input(s): "LIPASE", "AMYLASE" in the last 168 hours. No results for input(s): "AMMONIA" in the last 168 hours. CBC: Recent  Labs    11/21/21 0948 12/25/21 1004 12/28/21 0329 01/06/22 0000 05/31/22 1146 07/11/22 0849 07/11/22 0853 07/25/22 1030 07/27/22 0101 07/29/22 0109 08/02/22 0646 08/03/22 0652 08/03/22 2122 08/04/22 0149  HGB 9.7* 9.4*   < > 11.1*   < >  --    < >  --    < > 7.6* 8.5* 7.1* 8.2* 8.1*  MCV 83.5 86.1   < >  --    < >  --    < >  --    < > 85.7 86.1 87.3  --  86.2  FERRITIN  --   --   --  124  --  56  --  76  --   --   --   --   --   --   TIBC  --   --   --   --   --  158*  --  134*  --   --   --   --   --   --   IRON 32* 35*  --  79  --  21*  --  51  --   --   --   --   --   --    < > = values in this interval not displayed.    Cardiac Enzymes: No results for input(s): "CKTOTAL", "CKMB", "CKMBINDEX", "TROPONINI" in the last 168 hours. CBG: Recent Labs  Lab 08/01/22 1551 08/01/22 1942 08/01/22 2323 08/02/22 1111 08/03/22 2340  GLUCAP 128* 141* 113* 115* 112*    Iron Studies: No results for input(s): "IRON", "TIBC", "TRANSFERRIN", "FERRITIN" in the last 72 hours. Studies/Results: No results found.  Medications: Infusions:   Scheduled Medications:  Chlorhexidine Gluconate Cloth  6 each Topical Daily   darbepoetin (ARANESP) injection - NON-DIALYSIS  100 mcg Subcutaneous Q Sat-1800   ferrous sulfate  325 mg Oral Q breakfast   heparin  5,000 Units Subcutaneous Q8H   isosorbide-hydrALAZINE  1 tablet Oral BID   latanoprost  1 drop Both Eyes QHS   levothyroxine  50 mcg Oral Q0600   metoprolol succinate  12.5 mg Oral Daily    pantoprazole  40 mg Oral Daily   polyethylene glycol  17 g Oral Daily   tamsulosin  0.4 mg Oral Daily   timolol  1 drop Both Eyes BID    have reviewed scheduled and prn medications.  Physical Exam: General:NAD, comfortable Heart:RRR, s1s2 nl Lungs:clear b/l, no crackle Abdomen:soft, Non-tender, non-distended Extremities:No edema Neurology: Alert, awake but confused male  Rick [name] 08/04/2022,8:41 AM  LOS: 11 days

## 2022-08-04 NOTE — Discharge Summary (Signed)
Physician Discharge Summary   Patient: Rick [name] MRN: [id number] DOB: 07/27/1928  Admit date:     07/23/2022  Discharge date: 08/04/22  Discharge Physician: Jimmy Picket Aryanah Enslow   PCP: Tonia Ghent, MD   Recommendations at discharge:    Patient with improvement in volume status and serum NA, plan to follow up as outpatient with nephrology, [name].  Continue furosemide 40 mg po daily.  Metoprolol dose has been reduced to 12.5 mg po bid to prevent hypotension.  Follow up renal function and electrolytes in 7 days.  Follow up with Dr [name] in 7 to 10 days.  Discharge Diagnoses: Active Problems:   Acute on chronic combined systolic and diastolic CHF (congestive heart failure) (HCC)   Stage 4 chronic kidney disease (HCC)   Essential hypertension   Hypothyroidism   Acute metabolic encephalopathy  Resolved Problems:   * No resolved hospital problems. [facility] Course: Rick [name] was admitted to the hospital with the working diagnosis of volume overload in the setting of CKD stage 22.   89 yr old man with stage IV CKD, who presented with dyspnea, lower extremity edema, decreased energy and orthopnea. Worsening symptoms for one month.  He had frequent hospitalizations for volume overload, treated with furosemide. Follows with Nephrology as outpatient he has been referred for vascular access as outpatient. On the day of hospitalization he was evaluated at the nephrology office and referred to the ED due to volume overload.  On his ED evaluation his blood pressure was 147/81, HR 50, RR 21 and 02 saturation 96%, lungs with rhonchi bilaterally, heart with S1 and S2 present and rhythmic, abdomen with no distention, positive lower extremity edema +++.   Na 128, K 3,8 CL 90, bicarbonate 26, glucose 93 bun 36 cr 4,46  Albumin 1,7  BNP 1,433 High sensitive troponin 16  Wbc 9,6 hgb 8,8 plt 282   Urine analysis with SG 1.013, protein >300, wbc >50 (moderate leukocytes),   Chest  radiograph with cardiomegaly, bilateral hilar vascular congestion, with small left pleural effusion and bilateral atelectasis at bases.   EKG 66 bpm, left axis deviation, right bundle branch block, sinus rhythm with 1st degree AV block, with no significant ST segment or T wave changes.   He was given 120 mg Lasix in the ED with 200 cc output over 12 hours. Nephrology has been consulted and wishes to continue attempts to aggressively diurese. They state that the patient will need to be set up for HD access as outpatient. The patient states that he wishes to go on dialysis despite advice to the contrary.   02/22 volume status has improved, continue to have hyponatremia.  02.23 considered refractive to tolvaptan and now started on IV 3% saline, transferred to ICU (will stay in [facility] service).  02/24 Na up to 129, with improvement in mental status.  02/25 improved Na with hypertonic saline. PRBC transfusion for low hgb.  02/26 renal function and Na more stable, plan to continue oral diuretic therapy and follow up with nephrology as outpatient.    Assessment and Plan: Acute on chronic combined systolic and diastolic CHF (congestive heart failure) (HCC) Echocardiogram with reduced LV systolic function EF 30 to 35%, with global hypokinesis. Mild LVH, E-A fusion, RV systolic function preserved, LA with moderate dilatation. Small pericardial effusion. Moderate to severe mitral regurgitation, moderate aortic regurgitation.  Patient had aggressive diuresis, negative fluid balance was achieved, - 14,623 ml with improvement in his symptoms.   Patient will  continue heart failure therapy with Bidil and metoprolol (dose has been reduced to prevent hypotension).  Continue furosemide 40 mg po daily.  Follow up as outpatient.  Heart failure decompensation is likely due to progressive renal failure.   Stage 4 chronic kidney disease (HCC) Polycystic renal disease.  Non oliguric acute renal failure. Hyponatremia  hypophosphatemia    Patient had aggressive diuresis.  Developed worsening hyponatremia, refractive to fee water restriction and tolvaptan.  He was transferred to the ICU and had hypertonic saline infusion for about 2 days.  At the time of his discharge his renal function has a serum cr of 4.13 with K at 3,6 and serum bicarbonate at 26.  Na 128. P 3.0 Mg 2.3   Considering patient's comorbid conditions of heart failure, advance age and deconditioning, likely not a good candidate for renal replacement therapy. Recommendations to consult palliative care as outpatient.   Anemia of chronic renal disease.  Continue with EPO. Sp one unit PRBC transfusion with discharge hgb at 8.1  Mild reactive leukocytosis 13,4   Patient ruled out for urinary tract infection. No antibiotics prescribed.   Essential hypertension Continue blood pressure control with bidil and metoprolol.   Hypothyroidism Continue with levothyroxine.   Acute metabolic encephalopathy Transitory delirium that has improved. Patient will continue his recovery at home with home health services.  I spoke with his daughter about therapy recommendations for SNF, but family prefers to take patient home.           Consultants: nephrology  Procedures performed: none   Disposition: Home Diet recommendation:  Discharge Diet Orders (From admission, onward)     Start     Ordered   08/04/22 0000  Diet - low sodium heart healthy        08/04/22 1410           Cardiac diet DISCHARGE MEDICATION: Allergies as of 08/04/2022       Reactions   Lisinopril    cough   Nsaids    Elevated Cr        Medication List     STOP taking these medications    isosorbide-hydrALAZINE 20-37.5 MG tablet Commonly known as: BIDIL       TAKE these medications    acetaminophen 325 MG tablet Commonly known as: TYLENOL Take 2 tablets (650 mg total) by mouth every 6 (six) hours as needed. What changed: reasons to take this    ferrous sulfate 325 (65 FE) MG tablet Take 1 tablet (325 mg total) by mouth daily with breakfast.   furosemide 40 MG tablet Commonly known as: LASIX Take 1 tablet (40 mg total) by mouth daily.   latanoprost 0.005 % ophthalmic solution Commonly known as: XALATAN Place 1 drop into both eyes at bedtime.   levothyroxine 50 MCG tablet Commonly known as: SYNTHROID Take 1 tablet (50 mcg total) by mouth daily. Take on empty stomach early in the morning.   metoprolol succinate 25 MG 24 hr tablet Commonly known as: TOPROL-XL Take 0.5 tablets (12.5 mg total) by mouth daily. Start taking on: August 05, 2022   omeprazole 20 MG tablet Commonly known as: PRILOSEC OTC Take 1 tablet (20 mg total) by mouth daily.   sodium bicarbonate 650 MG tablet Take 2 tablets (1,300 mg total) by mouth 2 (two) times daily.   tamsulosin 0.4 MG Caps capsule Commonly known as: FLOMAX Take 0.4 mg by mouth daily.   timolol 0.5 % ophthalmic solution Commonly known as: BETIMOL Place 1 drop into  both eyes 2 (two) times daily.        Follow-up Information     [facility], [facility] Follow up.   Specialty: Home Health Services Why: Agency will contact you to set up apt times Contact information: [address] [place] [address] [phone]                Discharge Exam: Filed Weights   07/30/22 0349 08/01/22 0311 08/03/22 0400  Weight: 72 kg 64 kg 62.2 kg   BP 111/69 (BP Location: Right Arm)   Pulse 80   Temp 97.6 F (36.4 C) (Oral)   Resp 18   Ht '5\' 8"'$  (1.727 m)   Wt 62.2 kg   SpO2 95%   BMI 20.85 kg/m   Patient is feeling better, dyspnea and edema have improved.   Neurology awake and alert ENT with mild pallor Cardiovascular with S1 and S2 present and rhythmic with no gallops, rubs or murmurs Respiratory with no rales or wheezing Abdomen with no distention  No lower extremity edema   Condition at discharge: stable  The results of significant diagnostics  from this hospitalization (including imaging, microbiology, ancillary and laboratory) are listed below for reference.   Imaging Studies: DG Chest 2 View  Result Date: 07/23/2022 CLINICAL DATA:  z shortness of breath EXAM: CHEST - 2 VIEW COMPARISON:  Chest x-ray dated May 31, 2022 FINDINGS: Visualized cardiac and mediastinal contours are unchanged. Small bilateral pleural effusions and lower lung predominant heterogeneous opacities. IMPRESSION: 1. Lower lung predominant heterogeneous opacities, differential considerations include pulmonary edema or infection. 2. Small bilateral pleural effusions. Electronically Signed   By: Yetta Glassman M.D.   On: 07/23/2022 12:08   VAS [place] UPPER EXTREMITY ARTERIAL DUPLEX  Result Date: 07/16/2022  UPPER EXTREMITY DUPLEX STUDY Patient Name:  Rick [name]  Date of Exam:   07/16/2022 Medical Rec #: [id number]        Accession #:    [id number] Date of Birth: 02-05-1929        Patient Gender: M Patient Age:   23 years Exam Location:  [facility] Procedure:      VAS [place] UPPER EXTREMITY ARTERIAL DUPLEX Referring Phys: Servando Snare --------------------------------------------------------------------------------  Indications: New access.  Performing Technologist: Ronal Fear RVS, RCS  Examination Guidelines: A complete evaluation includes B-mode imaging, spectral Doppler, color Doppler, and power Doppler as needed of all accessible portions of each vessel. Bilateral testing is considered an integral part of a complete examination. Limited examinations for reoccurring indications may be performed as noted.  Right Pre-Dialysis Findings: +-----------------------+----------+--------------------+---------+--------+ Location               PSV (cm/s)Intralum. Diam. (cm)Waveform Comments +-----------------------+----------+--------------------+---------+--------+ Brachial Antecub. fossa39        0.65                triphasic          +-----------------------+----------+--------------------+---------+--------+ Radial Art at Wrist    25        0.43                triphasic         +-----------------------+----------+--------------------+---------+--------+ Ulnar Art at Wrist     25        0.30                triphasic         +-----------------------+----------+--------------------+---------+--------+  Left Pre-Dialysis Findings: +-----------------------+----------+--------------------+---------+--------+ Location  PSV (cm/s)Intralum. Diam. (cm)Waveform Comments +-----------------------+----------+--------------------+---------+--------+ Brachial Antecub. fossa57        0.60                triphasic         +-----------------------+----------+--------------------+---------+--------+ Radial Art at Wrist    29        0.46                biphasic          +-----------------------+----------+--------------------+---------+--------+ Ulnar Art at Wrist     35        0.22                biphasic          +-----------------------+----------+--------------------+---------+--------+  Summary:  Right: No obstruction visualized in the right upper extremity. Left: No obstruction visualized in the left upper extremity. *See table(s) above for measurements and observations. Electronically signed by Servando Snare MD on 07/16/2022 at 1:33:16 PM.    Final    VAS [place] UPPER EXT VEIN MAPPING (PRE-OP AVF)  Result Date: 07/16/2022 UPPER EXTREMITY VEIN MAPPING Patient Name:  Rick [name]  Date of Exam:   07/16/2022 Medical Rec #: [id number]        Accession #:    [id number] Date of Birth: 12-Nov-1928        Patient Gender: M Patient Age:   89 years Exam Location:  [facility] Procedure:      VAS [place] UPPER EXT VEIN MAPPING (PRE-OP AVF) Referring Phys: Servando Snare --------------------------------------------------------------------------------  Indications: Pre-access. Performing Technologist: Ronal Fear  RVS, RCS  Examination Guidelines: A complete evaluation includes B-mode imaging, spectral Doppler, color Doppler, and power Doppler as needed of all accessible portions of each vessel. Bilateral testing is considered an integral part of a complete examination. Limited examinations for reoccurring indications may be performed as noted. +-----------------+-------------+----------+--------------+ Right Cephalic   Diameter (cm)Depth (cm)   Findings    +-----------------+-------------+----------+--------------+ Dist upper arm                             thrombus    +-----------------+-------------+----------+--------------+ Antecubital fossa                          thrombus    +-----------------+-------------+----------+--------------+ Prox forearm                               thrombus    +-----------------+-------------+----------+--------------+ Mid forearm                             not visualized +-----------------+-------------+----------+--------------+ Dist forearm                            not visualized +-----------------+-------------+----------+--------------+ +--------------+-------------+----------+--------------+ Right Basilic Diameter (cm)Depth (cm)   Findings    +--------------+-------------+----------+--------------+ Prox upper arm                       not visualized +--------------+-------------+----------+--------------+ Mid upper arm                        not visualized +--------------+-------------+----------+--------------+ Dist upper arm  not visualized +--------------+-------------+----------+--------------+ +-----------------+-------------+----------+--------------+ Left Cephalic    Diameter (cm)Depth (cm)   Findings    +-----------------+-------------+----------+--------------+ Shoulder             0.16                              +-----------------+-------------+----------+--------------+ Prox upper arm        0.13                              +-----------------+-------------+----------+--------------+ Mid upper arm        0.12                              +-----------------+-------------+----------+--------------+ Dist upper arm                             thrombus    +-----------------+-------------+----------+--------------+ Antecubital fossa                       not visualized +-----------------+-------------+----------+--------------+ Prox forearm                            not visualized +-----------------+-------------+----------+--------------+ Mid forearm                             not visualized +-----------------+-------------+----------+--------------+ +--------------+-------------+----------+--------------+ Left Basilic  Diameter (cm)Depth (cm)   Findings    +--------------+-------------+----------+--------------+ Prox upper arm                       not visualized +--------------+-------------+----------+--------------+ Mid upper arm                        not visualized +--------------+-------------+----------+--------------+ Dist upper arm                       not visualized +--------------+-------------+----------+--------------+ Summary: Right: 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        The basilic vein is not well visualized. 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       The basilic vein is not well visualized. *See table(s) above for measurements and observations.  Diagnosing physician: Servando Snare MD Electronically signed by Servando Snare MD on 07/16/2022 at 10:30:55 AM.    Final     Microbiology: Results for orders placed or performed during the hospital encounter of 07/23/22  MRSA Next Gen by PCR, Nasal     Status: None   Collection Time: 08/01/22  2:38 PM   Specimen: Nasal Mucosa; Nasal Swab  Result Value Ref Range Status   MRSA by PCR Next Gen NOT DETECTED NOT DETECTED Final    Comment: (NOTE) The  GeneXpert MRSA Assay (FDA approved for NASAL specimens only), is one component of a comprehensive MRSA colonization surveillance program. It is not intended to diagnose MRSA infection nor to guide or monitor treatment for MRSA infections. Test performance is not FDA approved in patients less than 40 years old. Performed at [facility], [place] [address], [place], [place] [address]     Labs: CBC: Recent Labs  Lab 07/29/22 0109 08/02/22 0646 08/03/22 0652 08/03/22 2122 08/04/22 0149  WBC 8.6 7.3  8.3  --  13.4*  HGB 7.6* 8.5* 7.1* 8.2* 8.1*  HCT 23.3* 25.3* 21.3* 23.9* 23.8*  MCV 85.7 86.1 87.3  --  86.2  PLT 195 163 184  --  [id number]   Basic Metabolic Panel: Recent Labs  Lab 07/31/22 0041 07/31/22 1814 08/01/22 0237 08/01/22 1228 08/02/22 0145 08/02/22 0646 08/03/22 0208 08/03/22 0652 08/03/22 1100 08/03/22 1510 08/04/22 0149  NA 123*   < > 122*   < > 126*  126*   < > 127* 129* 130* 128* 128*  K 3.6  --  3.2*  --  3.5  --   --  3.5  --   --  3.6  CL 79*  --  79*  --  85*  --   --  89*  --   --  89*  CO2 31  --  30  --  29  --   --  24  --   --  26  GLUCOSE 100*  --  98  --  107*  --   --  105*  --   --  107*  BUN 41*  --  43*  --  44*  --   --  50*  --   --  58*  CREATININE 4.27*  --  4.16*  --  3.95*  --   --  3.88*  --   --  4.13*  CALCIUM 8.2*  --  8.4*  --  8.2*  --   --  8.4*  --   --  8.5*  MG 2.0  --   --   --   --   --   --   --   --   --   --   PHOS 2.0*  --  2.7  --  2.3*  --   --  3.2  --   --  3.0   < > = values in this interval not displayed.   Liver Function Tests: Recent Labs  Lab 08/01/22 0237 08/02/22 0145 08/02/22 0646 08/03/22 0652 08/04/22 0149  AST  --   --  18  --   --   ALT  --   --  8  --   --   ALKPHOS  --   --  64  --   --   BILITOT  --   --  0.8  --   --   PROT  --   --  6.8  --   --   ALBUMIN 2.7* 2.3* 2.4* 2.4* 2.4*   CBG: Recent Labs  Lab 08/01/22 1551 08/01/22 1942 08/01/22 2323 08/02/22 1111 08/03/22 2340  GLUCAP  128* 141* 113* 115* 112*    Discharge time spent: greater than 30 minutes.  Signed: Tawni Millers, MD [facility] 08/04/2022

## 2022-08-04 NOTE — Progress Notes (Signed)
Mobility Specialist - Progress Note   08/04/22 1415  Mobility  Activity Ambulated with assistance in hallway  Level of Assistance Minimal assist, patient does 75% or more  Assistive Device Front wheel walker  Distance Ambulated (ft) 60 ft  Activity Response Tolerated well  Mobility Referral Yes  $Mobility charge 1 Mobility   Pt received in chair and sleep. Pt was easy to arouse and agreeable to mobility. No complaints throughout session. Pt was returned to chair with all needs met.  Franki Monte  Mobility Specialist Please contact via Solicitor or Rehab office at [phone]

## 2022-08-05 ENCOUNTER — Telehealth: Payer: Self-pay | Admitting: *Deleted

## 2022-08-05 ENCOUNTER — Other Ambulatory Visit: Payer: Self-pay

## 2022-08-05 DIAGNOSIS — N184 Chronic kidney disease, stage 4 (severe): Secondary | ICD-10-CM

## 2022-08-05 DIAGNOSIS — I5041 Acute combined systolic (congestive) and diastolic (congestive) heart failure: Secondary | ICD-10-CM

## 2022-08-05 DIAGNOSIS — I10 Essential (primary) hypertension: Secondary | ICD-10-CM

## 2022-08-05 NOTE — Transitions of Care (Post Inpatient/ED Visit) (Signed)
   08/05/2022  Name: Rick [name] MRN: [id number] DOB: 04-09-1929  Today's TOC FU Call Status: Today's TOC FU Call Status:: Unsuccessul Call (1st Attempt) Unsuccessful Call (1st Attempt) Date: 08/05/22  Attempted to reach the patient regarding the most recent Inpatient/ED visit.  Follow Up Plan: Additional outreach attempts will be made to reach the patient to complete the Transitions of Care (Post Inpatient/ED visit) call.   [place] [facility] [phone]

## 2022-08-06 ENCOUNTER — Telehealth: Payer: Self-pay | Admitting: *Deleted

## 2022-08-06 ENCOUNTER — Telehealth: Payer: Self-pay | Admitting: Family Medicine

## 2022-08-06 DIAGNOSIS — G9341 Metabolic encephalopathy: Secondary | ICD-10-CM | POA: Diagnosis not present

## 2022-08-06 DIAGNOSIS — I130 Hypertensive heart and chronic kidney disease with heart failure and stage 1 through stage 4 chronic kidney disease, or unspecified chronic kidney disease: Secondary | ICD-10-CM | POA: Diagnosis not present

## 2022-08-06 DIAGNOSIS — N184 Chronic kidney disease, stage 4 (severe): Secondary | ICD-10-CM | POA: Diagnosis not present

## 2022-08-06 DIAGNOSIS — I5043 Acute on chronic combined systolic (congestive) and diastolic (congestive) heart failure: Secondary | ICD-10-CM | POA: Diagnosis not present

## 2022-08-06 DIAGNOSIS — Z9181 History of falling: Secondary | ICD-10-CM | POA: Diagnosis not present

## 2022-08-06 DIAGNOSIS — E039 Hypothyroidism, unspecified: Secondary | ICD-10-CM | POA: Diagnosis not present

## 2022-08-06 MED ORDER — ATORVASTATIN CALCIUM 10 MG PO TABS: 10.0000 mg | ORAL_TABLET | Freq: Every day | ORAL | Status: DC

## 2022-08-06 NOTE — Telephone Encounter (Signed)
Home Health verbal orders Caller Name: Rick [name]  Agency Name: [facility] Picket number: [phone]  Requesting OT/PT/Skilled nursing/Social Work/Speech: PT, OT eval & Nursing   Reason:  Frequency: will call back with pt & ot frequency, nursing - once a week for four weeks   Please forward to [facility] pool or providers CMA    Inez Catalina stated pt was taking atorvastatin before being put in Max, Rick [name] is asking [name] should pt continue meds?

## 2022-08-06 NOTE — Transitions of Care (Post Inpatient/ED Visit) (Signed)
   08/06/2022  Name: Rick [name] MRN: [id number] DOB: 04/10/1929  Today's TOC FU Call Status: Today's TOC FU Call Status:: Unsuccessful Call (2nd Attempt) Unsuccessful Call (2nd Attempt) Date: 08/06/22  Attempted to reach the patient regarding the most recent Inpatient/ED visit.  Follow Up Plan: Additional outreach attempts will be made to reach the patient to complete the Transitions of Care (Post Inpatient/ED visit) call.   [place] [facility] [phone]

## 2022-08-06 NOTE — Telephone Encounter (Signed)
Please give the order.  Thanks.  Reasonable to restart atorvastatin.  I added it back to his med list.  Let me know if he needs a refill.

## 2022-08-06 NOTE — Progress Notes (Signed)
  Care Coordination  Outreach Note  08/06/2022 Name: Rick [name] MRN: [id number] DOB: [birth year]   Care Coordination Outreach Attempts: An unsuccessful telephone outreach was attempted today to offer the patient information about available care coordination services as a benefit of their health plan.   Referral received   Follow Up Plan:  Additional outreach attempts will be made to offer the patient care coordination information and services.   Encounter Outcome:  No Answer  Julian Hy, [place] Direct Dial: [phone]

## 2022-08-07 ENCOUNTER — Telehealth: Payer: Self-pay | Admitting: *Deleted

## 2022-08-07 DIAGNOSIS — I5043 Acute on chronic combined systolic (congestive) and diastolic (congestive) heart failure: Secondary | ICD-10-CM | POA: Diagnosis not present

## 2022-08-07 DIAGNOSIS — I130 Hypertensive heart and chronic kidney disease with heart failure and stage 1 through stage 4 chronic kidney disease, or unspecified chronic kidney disease: Secondary | ICD-10-CM | POA: Diagnosis not present

## 2022-08-07 DIAGNOSIS — E039 Hypothyroidism, unspecified: Secondary | ICD-10-CM | POA: Diagnosis not present

## 2022-08-07 DIAGNOSIS — G9341 Metabolic encephalopathy: Secondary | ICD-10-CM | POA: Diagnosis not present

## 2022-08-07 DIAGNOSIS — N184 Chronic kidney disease, stage 4 (severe): Secondary | ICD-10-CM | POA: Diagnosis not present

## 2022-08-07 DIAGNOSIS — Z9181 History of falling: Secondary | ICD-10-CM | POA: Diagnosis not present

## 2022-08-07 NOTE — Transitions of Care (Post Inpatient/ED Visit) (Signed)
   08/07/2022  Name: Rick [name] MRN: [id number] DOB: Sep 12, 1928  Today's TOC FU Call Status: Today's TOC FU Call Status:: Successful TOC FU Call Competed TOC FU Call Complete Date: 08/07/22  [name] Follow-up Telephone Call Date of Discharge: 08/04/22 Discharge Facility: [name]) Type of Discharge: Inpatient Admission Primary Inpatient Discharge Diagnosis:: CHF How have you been since you were released from the [name]?: Better Any questions or concerns?: No  Items Reviewed: Medications obtained and verified?: Yes (Medications Reviewed) Any new allergies since your discharge?: No Dietary orders reviewed?: No Do you have support at home?: Yes People in Home: spouse Name of Support/Comfort Primary Source: Rick [name] and Equipment/Supplies: [place] Ordered?: Yes Name of [name]:: Rick [name] Has Agency set up a time to come to your home?: Yes [name] Visit Date: 08/06/22 Any new equipment or medical supplies ordered?: No  Functional Questionnaire: Do you need assistance with bathing/showering or dressing?: Yes Do you need assistance with meal preparation?: Yes Do you need assistance with eating?: No Do you have difficulty maintaining continence: No Do you need assistance with getting out of bed/getting out of a chair/moving?: Yes Do you have difficulty managing or taking your medications?: Yes  Folllow up appointments reviewed: PCP Follow-up appointment confirmed?: Yes (Careguide made appointment) Date of PCP follow-up appointment?: 08/13/21 Follow-up Provider: [place] [name] Follow-up appointment confirmed?: No Reason Specialist Follow-Up Not Confirmed: Patient has Specialist Provider Number and will Call for Appointment Do you need transportation to your follow-up appointment?: No Do you understand care options if your condition(s) worsen?: Yes-patient verbalized understanding  SDOH  Interventions Today    Flowsheet Row Most Recent Value  SDOH Interventions   Food Insecurity Interventions Intervention Not Indicated  Housing Interventions Intervention Not Indicated  Transportation Interventions Intervention Not Indicated      TOC Interventions Today    Flowsheet Row Most Recent Value  TOC Interventions   TOC Interventions Discussed/Reviewed Arranged PCP follow up less than 12 days/Care Guide scheduled      Interventions Today    Flowsheet Row Most Recent Value  Pharmacy Interventions   Pharmacy Dicussed/Reviewed Pharmacy Topics Discussed  [Rn discussed the changes in the metoprol dosage]       Rick [name] [phone]

## 2022-08-08 ENCOUNTER — Encounter (HOSPITAL_COMMUNITY): Payer: Medicare PPO

## 2022-08-08 NOTE — Telephone Encounter (Signed)
Verbal orders have been given to [place] at [facility]. Called patient and advised patients wife okay to restart atorvastatin. They have about 2 weeks left currently and will call for refill once needed.

## 2022-08-08 NOTE — Progress Notes (Unsigned)
  Care Coordination  Outreach Note  08/08/2022 Name: Rick [name] MRN: [id number] DOB: 1928/09/26   Care Coordination Outreach Attempts: A second unsuccessful outreach was attempted today to offer the patient with information about available care coordination services as a benefit of their health plan.     Follow Up Plan:  Additional outreach attempts will be made to offer the patient care coordination information and services.   Encounter Outcome:  No Answer  Julian Hy, [place] Direct Dial: [phone]

## 2022-08-11 DIAGNOSIS — N184 Chronic kidney disease, stage 4 (severe): Secondary | ICD-10-CM | POA: Diagnosis not present

## 2022-08-11 DIAGNOSIS — I5043 Acute on chronic combined systolic (congestive) and diastolic (congestive) heart failure: Secondary | ICD-10-CM | POA: Diagnosis not present

## 2022-08-11 DIAGNOSIS — Z9181 History of falling: Secondary | ICD-10-CM | POA: Diagnosis not present

## 2022-08-11 DIAGNOSIS — I130 Hypertensive heart and chronic kidney disease with heart failure and stage 1 through stage 4 chronic kidney disease, or unspecified chronic kidney disease: Secondary | ICD-10-CM | POA: Diagnosis not present

## 2022-08-11 DIAGNOSIS — G9341 Metabolic encephalopathy: Secondary | ICD-10-CM | POA: Diagnosis not present

## 2022-08-11 DIAGNOSIS — E039 Hypothyroidism, unspecified: Secondary | ICD-10-CM | POA: Diagnosis not present

## 2022-08-11 NOTE — Progress Notes (Signed)
  Care Coordination  Outreach Note  08/11/2022 Name: Rick [name] MRN: [id number] DOB: [birth year]   Care Coordination Outreach Attempts: A third unsuccessful outreach was attempted today to offer the patient with information about available care coordination services as a benefit of their health plan.   Follow Up Plan:  No further outreach attempts will be made at this time. We have been unable to contact the patient to offer or enroll patient in care coordination services  Encounter Outcome:  No Answer  Julian Hy, [place] Direct Dial: [phone]

## 2022-08-12 DIAGNOSIS — N184 Chronic kidney disease, stage 4 (severe): Secondary | ICD-10-CM | POA: Diagnosis not present

## 2022-08-12 DIAGNOSIS — I5043 Acute on chronic combined systolic (congestive) and diastolic (congestive) heart failure: Secondary | ICD-10-CM | POA: Diagnosis not present

## 2022-08-12 DIAGNOSIS — G9341 Metabolic encephalopathy: Secondary | ICD-10-CM | POA: Diagnosis not present

## 2022-08-12 DIAGNOSIS — E039 Hypothyroidism, unspecified: Secondary | ICD-10-CM | POA: Diagnosis not present

## 2022-08-12 DIAGNOSIS — Z9181 History of falling: Secondary | ICD-10-CM | POA: Diagnosis not present

## 2022-08-12 DIAGNOSIS — I130 Hypertensive heart and chronic kidney disease with heart failure and stage 1 through stage 4 chronic kidney disease, or unspecified chronic kidney disease: Secondary | ICD-10-CM | POA: Diagnosis not present

## 2022-08-13 ENCOUNTER — Inpatient Hospital Stay (HOSPITAL_COMMUNITY)
Admission: EM | Admit: 2022-08-13 | Discharge: 2022-08-16 | DRG: 378 | Disposition: A | Discharge status: Home Health | Payer: Medicare PPO | Attending: Internal Medicine | Admitting: Internal Medicine

## 2022-08-13 ENCOUNTER — Encounter (HOSPITAL_COMMUNITY): Payer: Self-pay | Admitting: *Deleted

## 2022-08-13 ENCOUNTER — Other Ambulatory Visit: Payer: Self-pay

## 2022-08-13 DIAGNOSIS — R195 Other fecal abnormalities: Secondary | ICD-10-CM | POA: Diagnosis present

## 2022-08-13 DIAGNOSIS — E785 Hyperlipidemia, unspecified: Secondary | ICD-10-CM | POA: Diagnosis not present

## 2022-08-13 DIAGNOSIS — M1990 Unspecified osteoarthritis, unspecified site: Secondary | ICD-10-CM | POA: Diagnosis not present

## 2022-08-13 DIAGNOSIS — K31811 Angiodysplasia of stomach and duodenum with bleeding: Secondary | ICD-10-CM | POA: Diagnosis not present

## 2022-08-13 DIAGNOSIS — Q613 Polycystic kidney, unspecified: Secondary | ICD-10-CM | POA: Diagnosis not present

## 2022-08-13 DIAGNOSIS — D62 Acute posthemorrhagic anemia: Secondary | ICD-10-CM | POA: Diagnosis present

## 2022-08-13 DIAGNOSIS — D649 Anemia, unspecified: Secondary | ICD-10-CM | POA: Diagnosis not present

## 2022-08-13 DIAGNOSIS — E871 Hypo-osmolality and hyponatremia: Secondary | ICD-10-CM | POA: Diagnosis present

## 2022-08-13 DIAGNOSIS — D631 Anemia in chronic kidney disease: Secondary | ICD-10-CM | POA: Diagnosis not present

## 2022-08-13 DIAGNOSIS — N281 Cyst of kidney, acquired: Secondary | ICD-10-CM | POA: Diagnosis not present

## 2022-08-13 DIAGNOSIS — K5521 Angiodysplasia of colon with hemorrhage: Principal | ICD-10-CM | POA: Diagnosis present

## 2022-08-13 DIAGNOSIS — K449 Diaphragmatic hernia without obstruction or gangrene: Secondary | ICD-10-CM | POA: Diagnosis present

## 2022-08-13 DIAGNOSIS — I340 Nonrheumatic mitral (valve) insufficiency: Secondary | ICD-10-CM | POA: Diagnosis present

## 2022-08-13 DIAGNOSIS — Z6821 Body mass index (BMI) 21.0-21.9, adult: Secondary | ICD-10-CM

## 2022-08-13 DIAGNOSIS — N184 Chronic kidney disease, stage 4 (severe): Secondary | ICD-10-CM | POA: Diagnosis not present

## 2022-08-13 DIAGNOSIS — Q399 Congenital malformation of esophagus, unspecified: Secondary | ICD-10-CM | POA: Diagnosis not present

## 2022-08-13 DIAGNOSIS — Z888 Allergy status to other drugs, medicaments and biological substances status: Secondary | ICD-10-CM | POA: Diagnosis not present

## 2022-08-13 DIAGNOSIS — N179 Acute kidney failure, unspecified: Secondary | ICD-10-CM | POA: Diagnosis not present

## 2022-08-13 DIAGNOSIS — Z808 Family history of malignant neoplasm of other organs or systems: Secondary | ICD-10-CM | POA: Diagnosis not present

## 2022-08-13 DIAGNOSIS — I5043 Acute on chronic combined systolic (congestive) and diastolic (congestive) heart failure: Secondary | ICD-10-CM | POA: Diagnosis not present

## 2022-08-13 DIAGNOSIS — I129 Hypertensive chronic kidney disease with stage 1 through stage 4 chronic kidney disease, or unspecified chronic kidney disease: Secondary | ICD-10-CM | POA: Diagnosis not present

## 2022-08-13 DIAGNOSIS — D500 Iron deficiency anemia secondary to blood loss (chronic): Secondary | ICD-10-CM | POA: Diagnosis not present

## 2022-08-13 DIAGNOSIS — K317 Polyp of stomach and duodenum: Secondary | ICD-10-CM | POA: Diagnosis present

## 2022-08-13 DIAGNOSIS — R634 Abnormal weight loss: Secondary | ICD-10-CM | POA: Diagnosis present

## 2022-08-13 DIAGNOSIS — Z9181 History of falling: Secondary | ICD-10-CM | POA: Diagnosis not present

## 2022-08-13 DIAGNOSIS — J439 Emphysema, unspecified: Secondary | ICD-10-CM | POA: Diagnosis present

## 2022-08-13 DIAGNOSIS — Z87891 Personal history of nicotine dependence: Secondary | ICD-10-CM | POA: Diagnosis not present

## 2022-08-13 DIAGNOSIS — Z87798 Personal history of other (corrected) congenital malformations: Secondary | ICD-10-CM | POA: Diagnosis not present

## 2022-08-13 DIAGNOSIS — I5022 Chronic systolic (congestive) heart failure: Secondary | ICD-10-CM | POA: Diagnosis present

## 2022-08-13 DIAGNOSIS — I130 Hypertensive heart and chronic kidney disease with heart failure and stage 1 through stage 4 chronic kidney disease, or unspecified chronic kidney disease: Secondary | ICD-10-CM | POA: Diagnosis present

## 2022-08-13 DIAGNOSIS — Z8546 Personal history of malignant neoplasm of prostate: Secondary | ICD-10-CM | POA: Diagnosis not present

## 2022-08-13 DIAGNOSIS — E872 Acidosis, unspecified: Secondary | ICD-10-CM | POA: Diagnosis present

## 2022-08-13 DIAGNOSIS — Z86010 Personal history of colonic polyps: Secondary | ICD-10-CM | POA: Diagnosis not present

## 2022-08-13 DIAGNOSIS — I110 Hypertensive heart disease with heart failure: Secondary | ICD-10-CM | POA: Diagnosis not present

## 2022-08-13 DIAGNOSIS — N189 Chronic kidney disease, unspecified: Secondary | ICD-10-CM | POA: Diagnosis not present

## 2022-08-13 DIAGNOSIS — Z79890 Hormone replacement therapy (postmenopausal): Secondary | ICD-10-CM

## 2022-08-13 DIAGNOSIS — N185 Chronic kidney disease, stage 5: Secondary | ICD-10-CM | POA: Diagnosis not present

## 2022-08-13 DIAGNOSIS — Z801 Family history of malignant neoplasm of trachea, bronchus and lung: Secondary | ICD-10-CM

## 2022-08-13 DIAGNOSIS — K3189 Other diseases of stomach and duodenum: Secondary | ICD-10-CM | POA: Diagnosis not present

## 2022-08-13 DIAGNOSIS — Z823 Family history of stroke: Secondary | ICD-10-CM

## 2022-08-13 DIAGNOSIS — K31819 Angiodysplasia of stomach and duodenum without bleeding: Secondary | ICD-10-CM | POA: Diagnosis not present

## 2022-08-13 DIAGNOSIS — N400 Enlarged prostate without lower urinary tract symptoms: Secondary | ICD-10-CM | POA: Diagnosis present

## 2022-08-13 DIAGNOSIS — E8770 Fluid overload, unspecified: Secondary | ICD-10-CM | POA: Diagnosis not present

## 2022-08-13 DIAGNOSIS — K922 Gastrointestinal hemorrhage, unspecified: Secondary | ICD-10-CM | POA: Diagnosis present

## 2022-08-13 DIAGNOSIS — N2581 Secondary hyperparathyroidism of renal origin: Secondary | ICD-10-CM | POA: Diagnosis not present

## 2022-08-13 DIAGNOSIS — Z79899 Other long term (current) drug therapy: Secondary | ICD-10-CM

## 2022-08-13 DIAGNOSIS — Z886 Allergy status to analgesic agent status: Secondary | ICD-10-CM

## 2022-08-13 DIAGNOSIS — E876 Hypokalemia: Secondary | ICD-10-CM | POA: Diagnosis present

## 2022-08-13 DIAGNOSIS — R54 Age-related physical debility: Secondary | ICD-10-CM | POA: Diagnosis present

## 2022-08-13 DIAGNOSIS — K921 Melena: Secondary | ICD-10-CM | POA: Diagnosis not present

## 2022-08-13 DIAGNOSIS — I5020 Unspecified systolic (congestive) heart failure: Secondary | ICD-10-CM | POA: Diagnosis not present

## 2022-08-13 DIAGNOSIS — G9341 Metabolic encephalopathy: Secondary | ICD-10-CM | POA: Diagnosis not present

## 2022-08-13 DIAGNOSIS — I509 Heart failure, unspecified: Secondary | ICD-10-CM | POA: Diagnosis not present

## 2022-08-13 DIAGNOSIS — K219 Gastro-esophageal reflux disease without esophagitis: Secondary | ICD-10-CM | POA: Diagnosis present

## 2022-08-13 DIAGNOSIS — Z800 Family history of malignant neoplasm of digestive organs: Secondary | ICD-10-CM

## 2022-08-13 DIAGNOSIS — E039 Hypothyroidism, unspecified: Secondary | ICD-10-CM | POA: Diagnosis not present

## 2022-08-13 LAB — COMPREHENSIVE METABOLIC PANEL
ALT: 8 U/L (ref 0–44)
AST: 23 U/L (ref 15–41)
Albumin: 2.1 g/dL — ABNORMAL LOW (ref 3.5–5.0)
Alkaline Phosphatase: 89 U/L (ref 38–126)
Anion gap: 13 (ref 5–15)
BUN: 91 mg/dL — ABNORMAL HIGH (ref 8–23)
CO2: 22 mmol/L (ref 22–32)
Calcium: 8.1 mg/dL — ABNORMAL LOW (ref 8.9–10.3)
Chloride: 95 mmol/L — ABNORMAL LOW (ref 98–111)
Creatinine, Ser: 5.99 mg/dL — ABNORMAL HIGH (ref 0.61–1.24)
GFR, Estimated: 8 mL/min — ABNORMAL LOW (ref >60)
Glucose, Bld: 109 mg/dL — ABNORMAL HIGH (ref 70–99)
Potassium: 3.4 mmol/L — ABNORMAL LOW (ref 3.5–5.1)
Sodium: 130 mmol/L — ABNORMAL LOW (ref 135–145)
Total Bilirubin: 0.7 mg/dL (ref 0.3–1.2)
Total Protein: 7.2 g/dL (ref 6.5–8.1)

## 2022-08-13 LAB — CBC WITH DIFFERENTIAL/PLATELET
Abs Immature Granulocytes: 0.02 10*3/uL (ref 0.00–0.07)
Basophils Absolute: 0 10*3/uL (ref 0.0–0.1)
Basophils Relative: 1 %
Eosinophils Absolute: 0.1 10*3/uL (ref 0.0–0.5)
Eosinophils Relative: 1 %
HCT: 19.8 % — ABNORMAL LOW (ref 39.0–52.0)
Hemoglobin: 5.8 g/dL — CL (ref 13.0–17.0)
Immature Granulocytes: 0 %
Lymphocytes Relative: 9 %
Lymphs Abs: 0.5 10*3/uL — ABNORMAL LOW (ref 0.7–4.0)
MCH: 29.4 pg (ref 26.0–34.0)
MCHC: 29.3 g/dL — ABNORMAL LOW (ref 30.0–36.0)
MCV: 100.5 fL — ABNORMAL HIGH (ref 80.0–100.0)
Monocytes Absolute: 0.5 10*3/uL (ref 0.1–1.0)
Monocytes Relative: 9 %
Neutro Abs: 4.3 10*3/uL (ref 1.7–7.7)
Neutrophils Relative %: 80 %
Platelets: 276 10*3/uL (ref 150–400)
RBC: 1.97 MIL/uL — ABNORMAL LOW (ref 4.22–5.81)
RDW: 19.2 % — ABNORMAL HIGH (ref 11.5–15.5)
WBC: 5.3 10*3/uL (ref 4.0–10.5)
nRBC: 0 % (ref 0.0–0.2)

## 2022-08-13 LAB — POC OCCULT BLOOD, ED: Fecal Occult Bld: POSITIVE — AB

## 2022-08-13 LAB — PREPARE RBC (CROSSMATCH)

## 2022-08-13 MED ORDER — SODIUM CHLORIDE 0.9% IV SOLUTION
Freq: Once | INTRAVENOUS | Status: AC
  Administered 2022-08-13: 500 mL via INTRAVENOUS

## 2022-08-13 MED ORDER — PANTOPRAZOLE 80MG IVPB - SIMPLE MED
80.0000 mg | Freq: Once | INTRAVENOUS | Status: AC
  Administered 2022-08-13: 80 mg via INTRAVENOUS
  Filled 2022-08-13: qty 100

## 2022-08-13 MED ORDER — PANTOPRAZOLE SODIUM 40 MG IV SOLR: 40.0000 mg | Freq: Two times a day (BID) | INTRAVENOUS | Status: DC

## 2022-08-13 MED ORDER — PANTOPRAZOLE INFUSION (NEW) - SIMPLE MED
8.0000 mg/h | INTRAVENOUS | Status: DC
  Administered 2022-08-13 – 2022-08-15 (×2): 8 mg/h via INTRAVENOUS
  Filled 2022-08-13 (×4): qty 100

## 2022-08-13 NOTE — ED Triage Notes (Signed)
The pt went to the doctors office today for a checkup  they were called back this afternoon and was told he needed to come back to the hospital  abnormal labs

## 2022-08-13 NOTE — ED Provider Notes (Signed)
[facility] Provider Note   CSN: [id number] Arrival date & time: 08/13/22  1802     History  Chief Complaint  Patient presents with   abnoral lab    Rick [name] is a 89 y.o. male.  HPI   Patient has a history of prostate cancer, chronic kidney disease, hyperlipidemia, hypertension.  He presents to the ED for evaluation of abnormal laboratory tests.  Patient states he went to his doctor today.  He had laboratory test.  When he returned home he was called and told that his labs were normal.  Patient believes his hemoglobin was low.  He states he has some mild weakness but nothing acutely changed.  He is not having chest pain or shortness of breath.  Home Medications Prior to Admission medications   Medication Sig Start Date End Date Taking? Authorizing Provider  acetaminophen (TYLENOL) 325 MG tablet Take 2 tablets (650 mg total) by mouth every 6 (six) hours as needed. Patient taking differently: Take 650 mg by mouth every 6 (six) hours as needed for mild pain. 11/01/21   Jill Alexanders, PA-C  atorvastatin (LIPITOR) 10 MG tablet Take 1 tablet (10 mg total) by mouth daily. 08/06/22   Tonia Ghent, MD  ferrous sulfate 325 (65 FE) MG tablet Take 1 tablet (325 mg total) by mouth daily with breakfast. 11/21/21 11/21/22  Tonia Ghent, MD  furosemide (LASIX) 40 MG tablet Take 1 tablet (40 mg total) by mouth daily. 06/10/22 09/08/22  Mariel Aloe, MD  isosorbide-hydrALAZINE (BIDIL) 20-37.5 MG tablet Take 1 tablet by mouth 2 (two) times daily. 06/09/22 09/07/22  Mariel Aloe, MD  latanoprost (XALATAN) 0.005 % ophthalmic solution Place 1 drop into both eyes at bedtime.    [provider]  levothyroxine (SYNTHROID) 50 MCG tablet Take 1 tablet (50 mcg total) by mouth daily. Take on empty stomach early in the morning. 12/29/21   Tonia Ghent, MD  metoprolol succinate (TOPROL-XL) 25 MG 24 hr tablet Take 0.5 tablets (12.5 mg total) by  mouth daily. 08/05/22 09/04/22  Arrien, Jimmy Picket, MD  omeprazole (PRILOSEC OTC) 20 MG tablet Take 1 tablet (20 mg total) by mouth daily. 12/07/20   Tonia Ghent, MD  tamsulosin (FLOMAX) 0.4 MG CAPS capsule Take 0.4 mg by mouth daily. 05/30/22   [provider]  timolol (BETIMOL) 0.5 % ophthalmic solution Place 1 drop into both eyes 2 (two) times daily.    [provider]      Allergies    Lisinopril and Nsaids    Review of Systems   Review of Systems  Physical Exam Updated Vital Signs BP (!) 128/116   Pulse (!) 56   Temp (!) 97.3 F (36.3 C) (Oral)   Resp 18   Ht 1.727 m ('5\' 8"'$ )   Wt 62.2 kg   SpO2 98%   BMI 20.85 kg/m  Physical Exam Vitals and nursing note reviewed.  Constitutional:      Appearance: He is well-developed. He is not diaphoretic.  HENT:     Head: Normocephalic and atraumatic.     Right Ear: External ear normal.     Left Ear: External ear normal.  Eyes:     General: No scleral icterus.       Right eye: No discharge.        Left eye: No discharge.     Conjunctiva/sclera: Conjunctivae normal.  Neck:     Trachea: No tracheal  deviation.  Cardiovascular:     Rate and Rhythm: Normal rate and regular rhythm.  Pulmonary:     Effort: Pulmonary effort is normal. No respiratory distress.     Breath sounds: Normal breath sounds. No stridor. No wheezing or rales.  Abdominal:     General: Bowel sounds are normal. There is no distension.     Palpations: Abdomen is soft.     Tenderness: There is no abdominal tenderness. There is no guarding or rebound.  Genitourinary:    Rectum: Guaiac result positive.     Comments: Dark black stool Musculoskeletal:        General: No tenderness or deformity.     Cervical back: Neck supple.  Skin:    General: Skin is warm and dry.     Findings: No rash.  Neurological:     General: No focal deficit present.     Mental Status: He is alert.     Cranial Nerves: No cranial nerve deficit, dysarthria or  facial asymmetry.     Sensory: No sensory deficit.     Motor: No abnormal muscle tone or seizure activity.     Coordination: Coordination normal.  Psychiatric:        Mood and Affect: Mood normal.     ED Results / Procedures / Treatments   Labs (all labs ordered are listed, but only abnormal results are displayed) Labs Reviewed  CBC WITH DIFFERENTIAL/PLATELET - Abnormal; Notable for the following components:      Result Value   RBC 1.97 (*)    Hemoglobin 5.8 (*)    HCT 19.8 (*)    MCV 100.5 (*)    MCHC 29.3 (*)    RDW 19.2 (*)    Lymphs Abs 0.5 (*)    All other components within normal limits  COMPREHENSIVE METABOLIC PANEL - Abnormal; Notable for the following components:   Sodium 130 (*)    Potassium 3.4 (*)    Chloride 95 (*)    Glucose, Bld 109 (*)    BUN 91 (*)    Creatinine, Ser 5.99 (*)    Calcium 8.1 (*)    Albumin 2.1 (*)    GFR, Estimated 8 (*)    All other components within normal limits  POC OCCULT BLOOD, ED - Abnormal; Notable for the following components:   Fecal Occult Bld POSITIVE (*)    All other components within normal limits  TYPE AND SCREEN  PREPARE RBC (CROSSMATCH)    EKG EKG Interpretation  Date/Time:  Wednesday August 13 2022 21:01:32 EST Ventricular Rate:  57 PR Interval:  316 QRS Duration: 184 QT Interval:  573 QTC Calculation: 558 R Axis:   -80 Text Interpretation: Sinus rhythm Prolonged PR interval RBBB and LAFB Confirmed by Dorie Rank [phone]) on 08/13/2022 11:11:51 PM  Radiology No results found.  Procedures .Critical Care  Performed by: Dorie Rank, MD Authorized by: Dorie Rank, MD   Critical care provider statement:    Critical care time (minutes):  45   Critical care was time spent personally by me on the following activities:  Development of treatment plan with patient or surrogate, discussions with consultants, evaluation of patient's response to treatment, examination of patient, ordering and review of laboratory studies,  ordering and review of radiographic studies, ordering and performing treatments and interventions, pulse oximetry, re-evaluation of patient's condition and review of old charts     Medications Ordered in ED Medications  pantoprazole (PROTONIX) 80 mg /NS 100 mL IVPB (80 mg Intravenous  New Bag/Given 08/13/22 2256)  pantoprozole (PROTONIX) 80 mg /NS 100 mL infusion (has no administration in time range)  pantoprazole (PROTONIX) injection 40 mg (has no administration in time range)  0.9 %  sodium chloride infusion (Manually program via Guardrails IV Fluids) (500 mLs Intravenous New Bag/Given 08/13/22 2251)    ED Course/ Medical Decision Making/ A&P Clinical Course as of 08/13/22 2313  Wed Aug 13, 2022  2312 Case discussed with Dr. Nevada Crane regarding admission [JK]  2313 Patient's labs are notable for decreased hemoglobin.  He does have worsening renal function and elevated BUN.  His stools were Hemoccult positive [JK]    Clinical Course User Index [JK] Dorie Rank, MD                             Medical Decision Making Problems Addressed: Anemia, unspecified type: chronic illness or injury with exacerbation, progression, or side effects of treatment Chronic kidney disease, unspecified CKD stage: chronic illness or injury with exacerbation, progression, or side effects of treatment Gastrointestinal hemorrhage, unspecified gastrointestinal hemorrhage type: acute illness or injury that poses a threat to life or bodily functions  Amount and/or Complexity of Data Reviewed Labs: ordered. Decision-making details documented in ED Course.  Risk Prescription drug management. Decision regarding hospitalization.   Patient was sent to the ED for evaluation of abnormal laboratory test.  Patient reportedly had a low hemoglobin.  He was also told his kidney function is worse.  ED workup does show worsening renal function.  No evidence of acidosis or hyperkalemia at this time.  Patient also has a drop in his  hemoglobin.  I performed a rectal exam dark black stool noted.  It was guaiac positive.  Presentation concerning for upper GI bleed.  Patient has not had any symptoms.  Will start on IV Protonix.  I have ordered blood transfusions.  I will consult with the medical service for admission        Final Clinical Impression(s) / ED Diagnoses Final diagnoses:  Gastrointestinal hemorrhage, unspecified gastrointestinal hemorrhage type  Chronic kidney disease, unspecified CKD stage  Anemia, unspecified type    Rx / DC Orders ED Discharge Orders     None         Dorie Rank, MD 08/13/22 2313

## 2022-08-13 NOTE — ED Provider Triage Note (Signed)
Emergency Medicine Provider Triage Evaluation Note  Rick [name] , a 89 y.o. male  was evaluated in triage.  Pt complains of follow-up.  He went to his primary care provider who obtained labs today.  Was called later and told that his hemoglobin was too low and that he needed to come to the ED.  States he feels some weakness, but feels otherwise normal.  No chest pain or shortness of breath.  Takes iron and always has dark-colored stools.  No recent changes.  Has needed blood transfusions most recently 2 weeks ago  Review of Systems  Positive: As above Negative: As above  Physical Exam  BP 114/67 (BP Location: Right Arm)   Pulse 62   Temp 97.6 F (36.4 C)   Resp 16   Ht '5\' 8"'$  (1.727 m)   Wt 62.2 kg   SpO2 100%   BMI 20.85 kg/m  Gen:   Awake, no distress   Resp:  Normal effort  MSK:   Moves extremities without difficulty  Other:  No abdominal TTP  Medical Decision Making  Medically screening exam initiated at 6:50 PM.  Appropriate orders placed.  Rick [name] was informed that the remainder of the evaluation will be completed by another provider, this initial triage assessment does not replace that evaluation, and the importance of remaining in the ED until their evaluation is complete.  Labs ordered   Nehemiah Massed 08/13/22 [id number]

## 2022-08-13 NOTE — H&P (Signed)
History and Physical  Rick [name] [id number] DOB: 1929-02-14 DOA: 08/13/2022  Referring physician: Dr. Tomi Bamberger, EDP PCP: Tonia Ghent, MD  Outpatient Specialists: Nephrology. Patient coming from: Home.  Chief Complaint: Abnormal lab results.  HPI: Rick [name] is a 89 y.o. male with medical history significant for CKD 4, prostate cancer, hyperlipidemia, hypertension, history of gastric polyps, who presented to [facility] ED, referred by his nephrologist due to abnormal lab results.  The patient had a routine posthospitalization appointment and was found to be significantly anemic with hemoglobin drop, 5.8 from 8.1, 9 days ago.  Also was found to have elevated creatinine 5.99 from baseline of 4.1.  In the ED, the patient appears frail.  He denies having abdominal pain or nausea.  His physical exam was notable for abdominal distention, this is chronic, per his wife at bedside.  Denies hematochezia.  They have noted dark stool.  They thought it was due to the iron supplement.  FOBT positive.  EDP discussed the case with GI, Dr. Modena Nunnery, [facility] will see in consultation.  The patient was started on Protonix drip in the ED.  TRH, hospitalist service was asked to admit.  ED Course: Tmax 97.6.  BP 126/68.  Heart rate 66, respiratory 25, O2 saturation 95% on room air.  Lab studies remarkable for serum sodium 130, serum bicarb 21, creatinine 5.99.  Phosphorus 4.8.  Albumin 2.1.  Hemoglobin 5.8.  Review of Systems: Review of systems as noted in the HPI. All other systems reviewed and are negative.   Past Medical History:  Diagnosis Date   Arthritis    GERD (gastroesophageal reflux disease)    History of colon polyps    History of prostate cancer    Hyperlipidemia    Hypertension    Kidney cysts    bilateral.  Renal US   Mitral regurgitation 12/02/2020   Past Surgical History:  Procedure Laterality Date   BIOPSY  05/05/2021   Procedure: BIOPSY;  Surgeon: Thornton Park, MD;   Location: [facility];  Service: Gastroenterology;;   COLONOSCOPY     CYSTOSCOPY  02/12/99   biopsy   ESOPHAGEAL BANDING N/A 08/04/2012   Procedure: ESOPHAGEAL BANDING;  Surgeon: Inda Castle, MD;  Location: [facility] ENDOSCOPY;  Service: Endoscopy;  Laterality: N/A;   ESOPHAGOGASTRODUODENOSCOPY N/A 08/04/2012   Procedure: ESOPHAGOGASTRODUODENOSCOPY (EGD);  Surgeon: Inda Castle, MD;  Location: [facility];  Service: Endoscopy;  Laterality: N/A;   ESOPHAGOGASTRODUODENOSCOPY (EGD) WITH PROPOFOL N/A 05/05/2021   Procedure: ESOPHAGOGASTRODUODENOSCOPY (EGD) WITH PROPOFOL;  Surgeon: Thornton Park, MD;  Location: [facility];  Service: Gastroenterology;  Laterality: N/A;   INGUINAL HERNIA REPAIR  02/1999   Dr. Reece Agar   POLYPECTOMY  05/05/2021   Procedure: POLYPECTOMY;  Surgeon: Thornton Park, MD;  Location: [facility] ENDOSCOPY;  Service: Gastroenterology;;   PROSTATECTOMY  02/1999    Social History:  reports that he quit smoking about 20 years ago. His smoking use included cigarettes. He has quit using smokeless tobacco.  His smokeless tobacco use included chew. He reports current alcohol use. He reports that he does not use drugs.   Allergies  Allergen Reactions   Lisinopril     cough   Nsaids     Elevated Cr    Family History  Problem Relation Age of Onset   Bone cancer Sister    Stomach cancer Brother    Stomach cancer Brother    Lung cancer Sister    Colon cancer Sister    Stroke Mother  Colon cancer Other        nephew      Prior to Admission medications   Medication Sig Start Date End Date Taking? Authorizing Provider  acetaminophen (TYLENOL) 325 MG tablet Take 2 tablets (650 mg total) by mouth every 6 (six) hours as needed. Patient taking differently: Take 650 mg by mouth every 6 (six) hours as needed for mild pain. 11/01/21  Yes Simaan, Darci Current, PA-C  atorvastatin (LIPITOR) 10 MG tablet Take 1 tablet (10 mg total) by mouth daily. 08/06/22  Yes Tonia Ghent, MD   ferrous sulfate 325 (65 FE) MG tablet Take 1 tablet (325 mg total) by mouth daily with breakfast. 11/21/21 11/21/22 Yes Tonia Ghent, MD  furosemide (LASIX) 40 MG tablet Take 1 tablet (40 mg total) by mouth daily. 06/10/22 09/08/22 Yes Mariel Aloe, MD  isosorbide-hydrALAZINE (BIDIL) 20-37.5 MG tablet Take 1 tablet by mouth 2 (two) times daily. 06/09/22 09/07/22 Yes Mariel Aloe, MD  latanoprost (XALATAN) 0.005 % ophthalmic solution Place 1 drop into both eyes at bedtime.   Yes [provider]  levothyroxine (SYNTHROID) 50 MCG tablet Take 1 tablet (50 mcg total) by mouth daily. Take on empty stomach early in the morning. 12/29/21  Yes Tonia Ghent, MD  metoprolol succinate (TOPROL-XL) 25 MG 24 hr tablet Take 0.5 tablets (12.5 mg total) by mouth daily. 08/05/22 09/04/22 Yes Arrien, Jimmy Picket, MD  omeprazole (PRILOSEC OTC) 20 MG tablet Take 1 tablet (20 mg total) by mouth daily. 12/07/20  Yes Tonia Ghent, MD  tamsulosin (FLOMAX) 0.4 MG CAPS capsule Take 0.4 mg by mouth daily. 05/30/22  Yes [provider]  timolol (BETIMOL) 0.5 % ophthalmic solution Place 1 drop into both eyes 2 (two) times daily.   Yes [provider]    Physical Exam: BP (!) 124/104 (BP Location: Right Arm)   Pulse 72   Temp (!) 97.4 F (36.3 C) (Oral)   Resp 17   Ht '5\' 8"'$  (1.727 m)   Wt 62.2 kg   SpO2 95%   BMI 20.85 kg/m   General: 89 y.o. year-old male frail-appearing in no acute distress.  Alert and oriented x3. Cardiovascular: Regular rate and rhythm with no rubs or gallops.  No thyromegaly or JVD noted.  No lower extremity edema. 2/4 pulses in all 4 extremities. Respiratory: Clear to auscultation with no wheezes or rales.  Poor inspiratory effort. Abdomen: Soft nontender nondistended with normal bowel sounds x4 quadrants. Muskuloskeletal: No cyanosis, clubbing or edema noted bilaterally Neuro: CN II-XII intact, strength, sensation, reflexes Skin: No ulcerative lesions noted  or rashes Psychiatry: Judgement and insight appear normal. Mood is appropriate for condition and setting          Labs on Admission:  Basic Metabolic Panel: Recent Labs  Lab 08/13/22 2114  NA 130*  K 3.4*  CL 95*  CO2 22  GLUCOSE 109*  BUN 91*  CREATININE 5.99*  CALCIUM 8.1*   Liver Function Tests: Recent Labs  Lab 08/13/22 2114  AST 23  ALT 8  ALKPHOS 89  BILITOT 0.7  PROT 7.2  ALBUMIN 2.1*   No results for input(s): "LIPASE", "AMYLASE" in the last 168 hours. No results for input(s): "AMMONIA" in the last 168 hours. CBC: Recent Labs  Lab 08/13/22 2114  WBC 5.3  NEUTROABS 4.3  HGB 5.8*  HCT 19.8*  MCV 100.5*  PLT 276   Cardiac Enzymes: No results for input(s): "CKTOTAL", "CKMB", "CKMBINDEX", "TROPONINI" in the last  168 hours.  BNP (last 3 results) Recent Labs    05/31/22 2211 07/23/22 1120  BNP 1,180.5* 1,433.9*    ProBNP (last 3 results) Recent Labs    02/17/22 1622  PROBNP 4,983*    CBG: No results for input(s): "GLUCAP" in the last 168 hours.  Radiological Exams on Admission: No results found.  EKG: I independently viewed the EKG done and my findings are as followed: Sinus pericardia rate of 57.  Nonspecific ST-T changes.  QTc 558.  Assessment/Plan Present on Admission:  GI bleed  Principal Problem:   GI bleed  Suspected upper GI bleed History of multiple gastric polyps Started on Protonix drip in the ED, continue. [facility] consulted N.p.o. until seen by GI. Closely monitor on telemetry Maintain MAP greater than 65  Acute blood loss anemia in the setting of above Hemoglobin 5.8 from baseline of 8 2 units PRBCs ordered to be transfused Repeat CBC post blood transfusions Transfuse hemoglobin less than 8.  AKI on CKD 4 Baseline creatinine appears to be 4.1 with GFR of 14 Presented with creatinine of 5.99 with GFR of 8 Avoid nephrotoxic agents and hypotension. Monitor urine output with strict I's and O's.  HFrEF 30 to  35% Euvolemic on exam Monitor strict I's and O's and daily weight  Mild anion gap metabolic acidosis Serum bicarb 21 Anion gap 15 P.o. tablet bicarb 1300 mg twice daily x 2 doses.  Hyperphosphatemia in the setting of renal insufficiency Monitor, repeat levels, if persistent treat.   DVT prophylaxis: SCDs.  Code Status: Full code as stated by the patient himself.  Family Communication: Updated his wife and daughter at bedside.  Disposition Plan: Admitted to progressive care unit  Consults called: GI consulted by EDP.  Admission status: Inpatient status.   Status is: Inpatient The patient will require at least 2 midnights for further evaluation and treatment of present condition.   Kayleen Memos MD [facility] Pager [phone]  If 7PM-7AM, please contact night-coverage [email or web address] Password [email or web address]  08/14/2022, 2:01 AM

## 2022-08-14 ENCOUNTER — Inpatient Hospital Stay (HOSPITAL_COMMUNITY): Payer: Medicare PPO | Admitting: Anesthesiology

## 2022-08-14 ENCOUNTER — Encounter (HOSPITAL_COMMUNITY)
Admission: EM | Disposition: A | Discharge status: Home Health | Payer: Self-pay | Source: Home / Self Care | Attending: Internal Medicine

## 2022-08-14 ENCOUNTER — Inpatient Hospital Stay: Payer: Medicare PPO | Admitting: Family Medicine

## 2022-08-14 ENCOUNTER — Encounter (HOSPITAL_COMMUNITY): Payer: Self-pay | Admitting: Internal Medicine

## 2022-08-14 DIAGNOSIS — K3189 Other diseases of stomach and duodenum: Secondary | ICD-10-CM

## 2022-08-14 DIAGNOSIS — Z87891 Personal history of nicotine dependence: Secondary | ICD-10-CM

## 2022-08-14 DIAGNOSIS — E785 Hyperlipidemia, unspecified: Secondary | ICD-10-CM

## 2022-08-14 DIAGNOSIS — I129 Hypertensive chronic kidney disease with stage 1 through stage 4 chronic kidney disease, or unspecified chronic kidney disease: Secondary | ICD-10-CM

## 2022-08-14 DIAGNOSIS — K31811 Angiodysplasia of stomach and duodenum with bleeding: Secondary | ICD-10-CM

## 2022-08-14 DIAGNOSIS — D500 Iron deficiency anemia secondary to blood loss (chronic): Secondary | ICD-10-CM

## 2022-08-14 DIAGNOSIS — Q399 Congenital malformation of esophagus, unspecified: Secondary | ICD-10-CM

## 2022-08-14 DIAGNOSIS — Z87798 Personal history of other (corrected) congenital malformations: Secondary | ICD-10-CM

## 2022-08-14 DIAGNOSIS — K317 Polyp of stomach and duodenum: Secondary | ICD-10-CM

## 2022-08-14 DIAGNOSIS — K449 Diaphragmatic hernia without obstruction or gangrene: Secondary | ICD-10-CM

## 2022-08-14 DIAGNOSIS — K922 Gastrointestinal hemorrhage, unspecified: Secondary | ICD-10-CM | POA: Diagnosis not present

## 2022-08-14 DIAGNOSIS — M1990 Unspecified osteoarthritis, unspecified site: Secondary | ICD-10-CM

## 2022-08-14 DIAGNOSIS — K921 Melena: Secondary | ICD-10-CM

## 2022-08-14 DIAGNOSIS — Z86010 Personal history of colonic polyps: Secondary | ICD-10-CM

## 2022-08-14 DIAGNOSIS — I509 Heart failure, unspecified: Secondary | ICD-10-CM

## 2022-08-14 DIAGNOSIS — I110 Hypertensive heart disease with heart failure: Secondary | ICD-10-CM

## 2022-08-14 DIAGNOSIS — K219 Gastro-esophageal reflux disease without esophagitis: Secondary | ICD-10-CM

## 2022-08-14 DIAGNOSIS — K31819 Angiodysplasia of stomach and duodenum without bleeding: Secondary | ICD-10-CM

## 2022-08-14 HISTORY — PX: HOT HEMOSTASIS: SHX5433

## 2022-08-14 HISTORY — PX: ESOPHAGOGASTRODUODENOSCOPY (EGD) WITH PROPOFOL: SHX5813

## 2022-08-14 HISTORY — PX: HEMOSTASIS CLIP PLACEMENT: SHX6857

## 2022-08-14 LAB — PHOSPHORUS: Phosphorus: 4.8 mg/dL — ABNORMAL HIGH (ref 2.5–4.6)

## 2022-08-14 LAB — CBC
HCT: 26.9 % — ABNORMAL LOW (ref 39.0–52.0)
Hemoglobin: 9.1 g/dL — ABNORMAL LOW (ref 13.0–17.0)
MCH: 29.4 pg (ref 26.0–34.0)
MCHC: 33.8 g/dL (ref 30.0–36.0)
MCV: 87.1 fL (ref 80.0–100.0)
Platelets: 261 10*3/uL (ref 150–400)
RBC: 3.09 MIL/uL — ABNORMAL LOW (ref 4.22–5.81)
RDW: 18.1 % — ABNORMAL HIGH (ref 11.5–15.5)
WBC: 6.7 10*3/uL (ref 4.0–10.5)
nRBC: 0 % (ref 0.0–0.2)

## 2022-08-14 LAB — BASIC METABOLIC PANEL
Anion gap: 15 (ref 5–15)
BUN: 91 mg/dL — ABNORMAL HIGH (ref 8–23)
CO2: 21 mmol/L — ABNORMAL LOW (ref 22–32)
Calcium: 7.9 mg/dL — ABNORMAL LOW (ref 8.9–10.3)
Chloride: 94 mmol/L — ABNORMAL LOW (ref 98–111)
Creatinine, Ser: 5.76 mg/dL — ABNORMAL HIGH (ref 0.61–1.24)
GFR, Estimated: 9 mL/min — ABNORMAL LOW (ref >60)
Glucose, Bld: 87 mg/dL (ref 70–99)
Potassium: 4.1 mmol/L (ref 3.5–5.1)
Sodium: 130 mmol/L — ABNORMAL LOW (ref 135–145)

## 2022-08-14 LAB — LAB REPORT - SCANNED: EGFR: 8

## 2022-08-14 LAB — MAGNESIUM: Magnesium: 2.3 mg/dL (ref 1.7–2.4)

## 2022-08-14 SURGERY — ESOPHAGOGASTRODUODENOSCOPY (EGD) WITH PROPOFOL
Anesthesia: Monitor Anesthesia Care

## 2022-08-14 MED ORDER — LATANOPROST 0.005 % OP SOLN
1.0000 [drp] | Freq: Every day | OPHTHALMIC | Status: DC
  Administered 2022-08-14 – 2022-08-15 (×2): 1 [drp] via OPHTHALMIC
  Filled 2022-08-14: qty 2.5

## 2022-08-14 MED ORDER — TAMSULOSIN HCL 0.4 MG PO CAPS
0.4000 mg | ORAL_CAPSULE | Freq: Every day | ORAL | Status: DC
  Administered 2022-08-14 – 2022-08-16 (×3): 0.4 mg via ORAL
  Filled 2022-08-14 (×3): qty 1

## 2022-08-14 MED ORDER — PROPOFOL 10 MG/ML IV BOLUS
INTRAVENOUS | Status: DC | PRN
  Administered 2022-08-14: 20 mg via INTRAVENOUS

## 2022-08-14 MED ORDER — PROCHLORPERAZINE EDISYLATE 10 MG/2ML IJ SOLN: 5.0000 mg | Freq: Four times a day (QID) | INTRAMUSCULAR | Status: DC | PRN

## 2022-08-14 MED ORDER — PROPOFOL 500 MG/50ML IV EMUL
INTRAVENOUS | Status: DC | PRN
  Administered 2022-08-14: 75 ug/kg/min via INTRAVENOUS

## 2022-08-14 MED ORDER — POTASSIUM CHLORIDE CRYS ER 20 MEQ PO TBCR
20.0000 meq | EXTENDED_RELEASE_TABLET | Freq: Once | ORAL | Status: AC
  Administered 2022-08-14: 20 meq via ORAL
  Filled 2022-08-14: qty 1

## 2022-08-14 MED ORDER — ATORVASTATIN CALCIUM 10 MG PO TABS
10.0000 mg | ORAL_TABLET | Freq: Every day | ORAL | Status: DC
  Administered 2022-08-14 – 2022-08-16 (×3): 10 mg via ORAL
  Filled 2022-08-14 (×3): qty 1

## 2022-08-14 MED ORDER — ISOSORB DINITRATE-HYDRALAZINE 20-37.5 MG PO TABS
1.0000 | ORAL_TABLET | Freq: Two times a day (BID) | ORAL | Status: DC
  Administered 2022-08-14 – 2022-08-16 (×4): 1 via ORAL
  Filled 2022-08-14 (×5): qty 1

## 2022-08-14 MED ORDER — FERROUS SULFATE 325 (65 FE) MG PO TABS
325.0000 mg | ORAL_TABLET | Freq: Every day | ORAL | Status: DC
  Administered 2022-08-15 – 2022-08-16 (×2): 325 mg via ORAL
  Filled 2022-08-14 (×2): qty 1

## 2022-08-14 MED ORDER — SODIUM BICARBONATE 650 MG PO TABS
1300.0000 mg | ORAL_TABLET | Freq: Two times a day (BID) | ORAL | Status: AC
  Administered 2022-08-14 (×2): 1300 mg via ORAL
  Filled 2022-08-14 (×2): qty 2

## 2022-08-14 MED ORDER — SODIUM CHLORIDE 0.9 % IV SOLN: INTRAVENOUS | Status: DC

## 2022-08-14 MED ORDER — MELATONIN 5 MG PO TABS: 5.0000 mg | ORAL_TABLET | Freq: Every evening | ORAL | Status: DC | PRN

## 2022-08-14 MED ORDER — ACETAMINOPHEN 325 MG PO TABS: 650.0000 mg | ORAL_TABLET | Freq: Four times a day (QID) | ORAL | Status: DC | PRN

## 2022-08-14 MED ORDER — METOPROLOL SUCCINATE ER 25 MG PO TB24
12.5000 mg | ORAL_TABLET | Freq: Every day | ORAL | Status: DC
  Administered 2022-08-14 – 2022-08-16 (×3): 12.5 mg via ORAL
  Filled 2022-08-14 (×3): qty 1

## 2022-08-14 MED ORDER — LEVOTHYROXINE SODIUM 50 MCG PO TABS
50.0000 ug | ORAL_TABLET | Freq: Every day | ORAL | Status: DC
  Administered 2022-08-15 – 2022-08-16 (×2): 50 ug via ORAL
  Filled 2022-08-14 (×2): qty 1

## 2022-08-14 MED ORDER — TIMOLOL MALEATE 0.5 % OP SOLN
1.0000 [drp] | Freq: Two times a day (BID) | OPHTHALMIC | Status: DC
  Administered 2022-08-14 – 2022-08-16 (×4): 1 [drp] via OPHTHALMIC
  Filled 2022-08-14: qty 5

## 2022-08-14 SURGICAL SUPPLY — 15 items

## 2022-08-14 NOTE — Op Note (Signed)
[facility] Patient Name: Rick [name] Procedure Date : 08/14/2022 MRN: [id number] Attending MD: Carlota Raspberry. Havery Moros , MD, [id number] Date of Birth: 10-07-1928 CSN: [id number] Age: 89 Admit Type: Inpatient Procedure:                Upper GI endoscopy Indications:              Suspected upper gastrointestinal bleeding - acute                            on chronic anemia, dark heme positive stools on                            iron. Hgb dropped to 5s, responded appropriately to                            transfusion Providers:                Remo Lipps P. Havery Moros, MD, Doristine Johns, RN,                            Frazier Richards, Technician Referring MD:              Medicines:                Monitored Anesthesia Care Complications:            No immediate complications. Estimated blood loss:                            Minimal. Estimated Blood Loss:     Estimated blood loss was minimal. Procedure:                Pre-Anesthesia Assessment:                           - Prior to the procedure, a History and Physical                            was performed, and patient medications and                            allergies were reviewed. The patient's tolerance of                            previous anesthesia was also reviewed. The risks                            and benefits of the procedure and the sedation                            options and risks were discussed with the patient.                            All questions were answered, and informed consent  was obtained. Prior Anticoagulants: The patient has                            taken no anticoagulant or antiplatelet agents. ASA                            Grade Assessment: III - A patient with severe                            systemic disease. After reviewing the risks and                            benefits, the patient was deemed in satisfactory                            condition to  undergo the procedure.                           After obtaining informed consent, the endoscope was                            passed under direct vision. Throughout the                            procedure, the patient's blood pressure, pulse, and                            oxygen saturations were monitored continuously. The                            [id number] Olympus endoscope was introduced                            through the mouth, and advanced to the second part                            of duodenum. The upper GI endoscopy was                            accomplished without difficulty. The patient                            tolerated the procedure well. Scope In: Scope Out: Findings:      Esophagogastric landmarks were identified: the Z-line was found at 39       cm, the gastroesophageal junction was found at 39 cm and the upper       extent of the gastric folds was found at 42 cm from the incisors.      A sliding 3-4 cm hiatal hernia was present. The hernia was wide, very       difficult to insufflate the stomach due to widely patent hernia.      The examined esophagus was quite tortuous.      The exam of the esophagus was otherwise normal.      Two small sessile polyps  were found in the gastric fundus and in the       gastric body. These were benign appearing and not removed given active       bleeding.      A single medium angiodysplastic lesion with bleeding was found in the       gastric body. There was fresh blood in the stomach upon entry with       active oozing / bleeding. Fulguration to stop the bleeding by argon       plasma was successful. To prevent re-bleeding post-intervention, two       hemostatic clips were successfully placed across the area. Another       smaller AVM without bleeding was noted in the fundus and ablated with       APC with good result.      Atrophic mucosa was found in the entire examined stomach. Biopsies not       obtained given  active bleeding on this exam.      The exam of the stomach was otherwise normal. Mucosa was quite friable       to water jet lavage.      The examined duodenum was normal. Impression:               - Esophagogastric landmarks identified.                           - 4 cm hiatal hernia, widely patent that led to                            difficultly with gastric insufflation and difficult                            visualizing the cardia.                           - Tortuous esophagus.                           - Normal esophagus otherwise.                           - Two gastric polyps. Benign appearing, not removed.                           - A single actively bleeding angiodysplastic lesion                            in the gastric body. Treated with argon plasma                            coagulation (APC). Clips were placed.                           - Another smaller AVM in the fundus, non bleeding,                            treated with APC.                           -  Gastric mucosal atrophy with friable mucosa                           - Normal stomach otherwise                           - Normal examined duodenum. Recommendation:           - Return patient to hospital ward for ongoing care.                           - Clear liquid diet.                           - Continue present medications.                           - Continue protonix '40mg'$  twice daily for the next                            month while stomach heals from APC                           - Avoid NSAIDs                           - Trend Hgb, monitor for rebleeding                           - Can advance diet tomorrow if he does well today                           - GI service will sign off for now, call with                            recurrent bleeding or questions moving forward. Procedure Code(s):        --- Professional ---                           [phone], Esophagogastroduodenoscopy, flexible,                             transoral; with control of bleeding, any method Diagnosis Code(s):        --- Professional ---                           K44.9, Diaphragmatic hernia without obstruction or                            gangrene                           Q39.9, Congenital malformation of esophagus,                            unspecified  K31.7, Polyp of stomach and duodenum                           K31.811, Angiodysplasia of stomach and duodenum                            with bleeding                           K31.89, Other diseases of stomach and duodenum CPT copyright 2022 American [employer] Association. All rights reserved. The codes documented in this report are preliminary and upon coder review may  be revised to meet current compliance requirements. Remo Lipps P. Ireta Pullman, MD 08/14/2022 2:11:12 PM This report has been signed electronically. Number of Addenda: 0

## 2022-08-14 NOTE — ED Notes (Signed)
ED TO INPATIENT HANDOFF REPORT  ED Nurse Name and Phone #: Bonna Gains [phone]  S Name/Age/Gender Rick [name] 89 y.o. male Room/Bed: 030C/030C  Code Status   Code Status: Full Code  Home/SNF/Other Home Patient oriented to: self, place, time, and situation Is this baseline? Yes   Triage Complete: Triage complete  Chief Complaint GI bleed [K92.2]  Triage Note The pt went to the doctors office today for a checkup  they were called back this afternoon and was told he needed to come back to the hospital  abnormal labs   Allergies Allergies  Allergen Reactions   Lisinopril     cough   Nsaids     Elevated Cr    Level of Care/Admitting Diagnosis ED Disposition     ED Disposition  [place]: [place] [100100]  Level of Care: Progressive [102]  Admit to Progressive based on following criteria: GI, ENDOCRINE disease patients with GI bleeding, acute liver failure or pancreatitis, stable with diabetic ketoacidosis or thyrotoxicosis (hypothyroid) state.  May admit patient to [name] if equivalent level of care is available:: Yes  Covid Evaluation: Asymptomatic - no recent exposure (last 10 days) testing not required  Diagnosis: GI bleed [id number]  Admitting Physician: Kayleen Memos [id number]  Attending Physician: Kayleen Memos [id number]  Certification:: I certify this patient will need inpatient services for at least 2 midnights  Estimated Length of Stay: 2          B Medical/Surgery History Past Medical History:  Diagnosis Date   Arthritis    GERD (gastroesophageal reflux disease)    History of colon polyps    History of prostate cancer    Hyperlipidemia    Hypertension    Kidney cysts    bilateral.  Renal US   Mitral regurgitation 12/02/2020   Past Surgical History:  Procedure Laterality Date   BIOPSY  05/05/2021   Procedure: BIOPSY;  Surgeon: Thornton Park, MD;   Location: [facility];  Service: Gastroenterology;;   COLONOSCOPY     CYSTOSCOPY  02/12/99   biopsy   ESOPHAGEAL BANDING N/A 08/04/2012   Procedure: ESOPHAGEAL BANDING;  Surgeon: Inda Castle, MD;  Location: [facility];  Service: Endoscopy;  Laterality: N/A;   ESOPHAGOGASTRODUODENOSCOPY N/A 08/04/2012   Procedure: ESOPHAGOGASTRODUODENOSCOPY (EGD);  Surgeon: Inda Castle, MD;  Location: [facility];  Service: Endoscopy;  Laterality: N/A;   ESOPHAGOGASTRODUODENOSCOPY (EGD) WITH PROPOFOL N/A 05/05/2021   Procedure: ESOPHAGOGASTRODUODENOSCOPY (EGD) WITH PROPOFOL;  Surgeon: Thornton Park, MD;  Location: [place];  Service: Gastroenterology;  Laterality: N/A;   INGUINAL HERNIA REPAIR  02/1999   Dr. Reece Agar   POLYPECTOMY  05/05/2021   Procedure: POLYPECTOMY;  Surgeon: Thornton Park, MD;  Location: [place];  Service: Gastroenterology;;   PROSTATECTOMY  02/1999     A IV Location/Drains/Wounds Patient Lines/Drains/Airways Status     Active Line/Drains/Airways     Name Placement date Placement time Site Days   Peripheral IV 08/13/22 20 G Anterior;Left;Upper Arm 08/13/22  2119  Arm  1   Peripheral IV 08/13/22 20 G 1" Anterior;Distal;Right;Upper Arm 08/13/22  2230  Arm  1   Wound / Incision (Open or Dehisced) 07/24/22 Other (Comment) Coccyx Posterior 07/24/22  1500  Coccyx  21            Intake/Output Last 24 hours  Intake/Output Summary (Last 24 hours) at 08/14/2022 0254 Last data filed at 08/14/2022  0131 Gross per 24 hour  Intake 449.67 ml  Output --  Net 449.67 ml    Labs/Imaging Results for orders placed or performed during the hospital encounter of 08/13/22 (from the past 48 hour(s))  Type and screen [place]     Status: None (Preliminary result)   Collection Time: 08/13/22  9:00 PM  Result Value Ref Range   ABO/RH(D) B POS    Antibody Screen NEG    Sample Expiration 08/16/2022,2359    Unit Number [id number]    Blood Component  Type RED CELLS,LR    Unit division 00    Status of Unit ISSUED    Transfusion Status OK TO TRANSFUSE    Crossmatch Result Compatible    Unit Number [id number]    Blood Component Type RED CELLS,LR    Unit division 00    Status of Unit ISSUED    Transfusion Status OK TO TRANSFUSE    Crossmatch Result      Compatible Performed at [facility], [place] [address] [place], [place] [address]   CBC with Differential     Status: Abnormal   Collection Time: 08/13/22  9:14 PM  Result Value Ref Range   WBC 5.3 4.0 - 10.5 K/uL   RBC 1.97 (L) 4.22 - 5.81 MIL/uL   Hemoglobin 5.8 (LL) 13.0 - 17.0 g/dL    Comment: REPEATED TO VERIFY THIS CRITICAL RESULT HAS VERIFIED AND BEEN CALLED TO Susa Raring RN BY [facility] AMIREHSANI ON 03 06 2024 AT 2153, AND HAS BEEN READ BACK.     HCT 19.8 (L) 39.0 - 52.0 %   MCV 100.5 (H) 80.0 - 100.0 fL   MCH 29.4 26.0 - 34.0 pg   MCHC 29.3 (L) 30.0 - 36.0 g/dL   RDW 19.2 (H) 11.5 - 15.5 %   Platelets 276 150 - 400 K/uL   nRBC 0.0 0.0 - 0.2 %   Neutrophils Relative % 80 %   Neutro Abs 4.3 1.7 - 7.7 K/uL   Lymphocytes Relative 9 %   Lymphs Abs 0.5 (L) 0.7 - 4.0 K/uL   Monocytes Relative 9 %   Monocytes Absolute 0.5 0.1 - 1.0 K/uL   Eosinophils Relative 1 %   Eosinophils Absolute 0.1 0.0 - 0.5 K/uL   Basophils Relative 1 %   Basophils Absolute 0.0 0.0 - 0.1 K/uL   Immature Granulocytes 0 %   Abs Immature Granulocytes 0.02 0.00 - 0.07 K/uL    Comment: Performed at [facility] [address], [place], [place] [address]  Comprehensive metabolic panel     Status: Abnormal   Collection Time: 08/13/22  9:14 PM  Result Value Ref Range   Sodium 130 (L) 135 - 145 mmol/L   Potassium 3.4 (L) 3.5 - 5.1 mmol/L   Chloride 95 (L) 98 - 111 mmol/L   CO2 22 22 - 32 mmol/L   Glucose, Bld 109 (H) 70 - 99 mg/dL    Comment: Glucose reference range applies only to samples taken after fasting for at least 8 hours.   BUN 91 (H) 8 - 23 mg/dL   Creatinine, Ser 5.99 (H)  0.61 - 1.24 mg/dL   Calcium 8.1 (L) 8.9 - 10.3 mg/dL   Total Protein 7.2 6.5 - 8.1 g/dL   Albumin 2.1 (L) 3.5 - 5.0 g/dL   AST 23 15 - 41 U/L   ALT 8 0 - 44 U/L   Alkaline Phosphatase 89 38 - 126 U/L  Total Bilirubin 0.7 0.3 - 1.2 mg/dL   GFR, Estimated 8 (L) >60 mL/min    Comment: (NOTE) Calculated using the CKD-EPI Creatinine Equation (2021)    Anion gap 13 5 - 15    Comment: Performed at [facility] [address], [place], [place] [address]  POC occult blood, ED     Status: Abnormal   Collection Time: 08/13/22 10:15 PM  Result Value Ref Range   Fecal Occult Bld POSITIVE (A) NEGATIVE  Prepare RBC (crossmatch)     Status: None   Collection Time: 08/13/22 10:18 PM  Result Value Ref Range   Order Confirmation      ORDER PROCESSED BY BLOOD BANK Performed at [facility], [place] [address] [place], [place] [address]    No results found.  Pending Labs Unresulted Labs (From admission, onward)     Start     Ordered   08/14/22 0500  CBC  Tomorrow morning,   R        08/14/22 0200   08/14/22 [id number]  Basic metabolic panel  Tomorrow morning,   R        08/14/22 0200   08/14/22 0500  Magnesium  Tomorrow morning,   R        08/14/22 0200   08/14/22 0500  Phosphorus  Tomorrow morning,   R        08/14/22 0200            Vitals/Pain Today's Vitals   08/14/22 0015 08/14/22 0115 08/14/22 0131 08/14/22 0213  BP: (!) 138/57 119/81 (!) 124/104 131/66  Pulse: 62 (!) 59 72 (!) 58  Resp: '14 13 17 '$ (!) 22  Temp:   (!) 97.4 F (36.3 C) (!) 97.5 F (36.4 C)  TempSrc:   Oral Oral  SpO2: 98% 97% 95% 96%  Weight:      Height:      PainSc:        Isolation Precautions No active isolations  Medications Medications  pantoprozole (PROTONIX) 80 mg /NS 100 mL infusion (8 mg/hr Intravenous New Bag/Given 08/13/22 2320)  pantoprazole (PROTONIX) injection 40 mg (has no administration in time range)  acetaminophen (TYLENOL) tablet 650 mg (has no administration in time range)   prochlorperazine (COMPAZINE) injection 5 mg (has no administration in time range)  melatonin tablet 5 mg (has no administration in time range)  pantoprazole (PROTONIX) 80 mg /NS 100 mL IVPB (0 mg Intravenous Stopped 08/13/22 2316)  0.9 %  sodium chloride infusion (Manually program via Guardrails IV Fluids) (500 mLs Intravenous New Bag/Given 08/13/22 2251)  potassium chloride SA (KLOR-CON M) CR tablet 20 mEq (20 mEq Oral Given 08/14/22 0215)    Mobility walks with person assist     Focused Assessments    Pt with GI bleed, abdominal swelling, no guarding or tenderness   R Recommendations: See Admitting Provider Note  Report given to:   Additional Notes:  Pt a/o x 4 , able to ambulate with assistance normally

## 2022-08-14 NOTE — Transfer of Care (Signed)
Immediate Anesthesia Transfer of Care Note  Patient: AIRRION VOSE  Procedure(s) Performed: ESOPHAGOGASTRODUODENOSCOPY (EGD) WITH PROPOFOL HOT HEMOSTASIS (ARGON PLASMA COAGULATION/BICAP) HEMOSTASIS CLIP PLACEMENT  Patient Location: Endoscopy Unit  Anesthesia Type:MAC  Level of Consciousness: drowsy  Airway & Oxygen Therapy: Patient Spontanous Breathing and Patient connected to nasal cannula oxygen  Post-op Assessment: Report given to RN and Post -op Vital signs reviewed and stable  Post vital signs: Reviewed and stable  Last Vitals:  Vitals Value Taken Time  BP 128/59 08/14/22 1402  Temp 36.2 C 08/14/22 1402  Pulse 59 08/14/22 1403  Resp 25 08/14/22 1403  SpO2 98 % 08/14/22 1403  Vitals shown include unvalidated device data.  Last Pain:  Vitals:   08/14/22 1402  TempSrc: Temporal  PainSc:          Complications: No notable events documented.

## 2022-08-14 NOTE — Progress Notes (Signed)
PROGRESS NOTE  Rick [name]  DOB: 01-07-1929  PCP: Rick Ghent, MD [id number]  DOA: 08/13/2022  LOS: 1 day  Hospital Day: 2  Brief narrative: Rick [name] is a 89 y.o. male with PMH significant for HTN, HLD, systolic CHF, GERD, CKD, prostate cancer, gastric polyps who was recently hospitalized 2/14 to 2/26 for volume overload for which she was aggressively diuresed (14 L negative balance) and was discharged on CHF regimen including Lasix. 3/6, patient had a post hospital appointment with his nephrologist.  Labs showed multiple abnormalities including low hemoglobin and elevated creatinine and hence sent to the ED.  In the ED, patient was afebrile, hemodynamically stable Family endorsed seeing dark stool for few days which they suspected was from iron supplement. FOBT was positive Labs in the ED showed a sodium of 130, potassium 3.4, BUN/creatinine 91/5.99, hemoglobin 5.8 EDP discussed the case with [facility], Dr. Modena Nunnery. Patient was started on Protonix drip  Admitted to [facility]  Subjective Patient was seen and examined this morning.  Pleasant elderly African-American male.  Lying on bed.  Not in distress.  Wife and daughter at bedside. Chart reviewed Overnight vital signs stable. Last set of labs from this morning showed improved potassium, creatinine slightly down at 5.76, hemoglobin improved to 9.1  Assessment and plan: Acute GI bleed History of multiple gastric polyps Presented with dark stool, low hemoglobin, FOBT positive Started on Protonix drip.  GI consulted.  Remains NPO.  May need EGD.   Acute blood loss anemia  Chronic iron deficiency anemia secondary to GI bleeding Hemoglobin low at 5.8 at presentation.  2 units of PRBC transfused.  Repeat hemoglobin this morning is better at 9.1. Continue iron supplement Recent Labs    11/21/21 0948 12/25/21 1004 12/28/21 0329 01/06/22 0000 05/31/22 1146 07/11/22 0849 07/11/22 0853 07/25/22 1030 07/27/22 0101  08/03/22 [id number] 08/03/22 2122 08/04/22 0149 08/13/22 2114 08/14/22 0752  HGB 9.7* 9.4*   < > 11.1*   < >  --    < >  --    < > 7.1* 8.2* 8.1* 5.8* 9.1*  MCV 83.5 86.1   < >  --    < >  --    < >  --    < > 87.3  --  86.2 100.5* 87.1  FERRITIN  --   --   --  124  --  56  --  76  --   --   --   --   --   --   TIBC  --   --   --   --   --  158*  --  134*  --   --   --   --   --   --   IRON 32* 35*  --  79  --  21*  --  51  --   --   --   --   --   --    < > = values in this interval not displayed.   Chronic systolic CHF  Last echo [id number] with EF 30 to 35% Euvolemic on exam PTA on Toprol 0.5 mg daily, Lasix 40 mg daily, isosorbide 20 mg twice daily, hydralazine 37.5 mg twice daily Currently meds on hold because of n.p.o. status for possible EGD today.  AKI on CKD 4 Baseline creatinine 4.1 on 2/26. Presented with creatinine elevated to 5.99 on 3/6. Will call nephrology consultation Recent Labs    07/28/22 0041 07/29/22 0109 07/30/22  0124 07/31/22 0041 08/01/22 0237 08/02/22 0145 08/03/22 [id number] 08/04/22 0149 08/13/22 2114 08/14/22 0439  BUN 36* 35* 38* 41* 43* 44* 50* 58* 91* 91*  CREATININE 4.19* 4.13* 4.28* 4.27* 4.16* 3.95* 3.88* 4.13* 5.99* 5.76*  CO'2 27 30 28 31 30 29 24 26 22 '$ 21*     Hypokalemia/hyperphosphatemia Potassium level improved with replacement.  Phosphorus level elevated due to CKD. Recent Labs  Lab 08/13/22 2114 08/14/22 0439  K 3.4* 4.1  MG  --  2.3  PHOS  --  4.8*   Hyponatremia Low sodium level but better than last hospitalization.  Probably related to CHF. Continue to monitor. Recent Labs  Lab 08/13/22 2114 08/14/22 0439  NA 130* 130*   HLD Lipitor 10 mg daily  Hypothyroidism Continue Synthroid 50 mcg daily  BPH Continue Flomax     Mobility: Ambulates with a walker and cane at home  Goals of care   Code Status: Full Code  Family would like to continue full CODE STATUS for now.   DVT prophylaxis:  SCDs Start: 08/14/22 0154    Antimicrobials: None Fluid: Protonix drip Consultants: GI, nephrology Family Communication: Wife and daughter at bedside  Status is: Inpatient Level of care: Progressive   Dispo: Patient is from: Home              Anticipated d/c is to: Pending clinical course Continue in-hospital care because: Pending EGD   Scheduled Meds:  [START ON 08/17/2022] pantoprazole  40 mg Intravenous Q12H   sodium bicarbonate  1,300 mg Oral BID    PRN meds: acetaminophen, melatonin, prochlorperazine   Infusions:   pantoprazole 8 mg/hr (08/13/22 2320)    Diet:  Diet Order             Diet NPO time specified Except for: Sips with Meds  Diet effective now                   Antimicrobials: Anti-infectives (From admission, onward)    None       Skin assessment:       Nutritional status:  Body mass index is 21.08 kg/m.          Objective: Vitals:   08/14/22 0546 08/14/22 0839  BP: 126/68 131/88  Pulse:  61  Resp:  19  Temp: (!) 97 F (36.1 C) (!) 97.2 F (36.2 C)  SpO2:  94%    Intake/Output Summary (Last 24 hours) at 08/14/2022 0849 Last data filed at 08/14/2022 0131 Gross per 24 hour  Intake 449.67 ml  Output --  Net 449.67 ml   Filed Weights   08/13/22 1839 08/14/22 0414  Weight: 62.2 kg 62.9 kg   Weight change:  Body mass index is 21.08 kg/m.   Physical Exam: General exam: Pleasant, elderly African-American male.  Not in distress Skin: No rashes, lesions or ulcers. HEENT: Atraumatic, normocephalic, no obvious bleeding Lungs: Clear to auscultation bilaterally CVS: Regular rate and rhythm, no murmur GI/Abd soft, nontender, nondistended, bowel sound present CNS: Alert, awake, able to answer simple questions Psychiatry: Mood appropriate Extremities: No pedal edema, no calf tenderness  Data Review: I have personally reviewed the laboratory data and studies available.  F/u labs ordered Unresulted Labs (From admission, onward)    None        Total time spent in review of labs and imaging, patient evaluation, formulation of plan, documentation and communication with family: 65 minutes  Signed, Terrilee Croak, MD [facility] 08/14/2022

## 2022-08-14 NOTE — Anesthesia Procedure Notes (Signed)
Procedure Name: MAC Date/Time: 08/14/2022 1:30 PM  Performed by: Wilburn Cornelia, CRNAPre-anesthesia Checklist: Patient identified, Emergency Drugs available, Suction available, Patient being monitored and Timeout performed Patient Re-evaluated:Patient Re-evaluated prior to induction Oxygen Delivery Method: Nasal cannula Placement Confirmation: positive ETCO2 and breath sounds checked- equal and bilateral Tube secured with: Tape Dental Injury: Teeth and Oropharynx as per pre-operative assessment

## 2022-08-14 NOTE — Anesthesia Preprocedure Evaluation (Addendum)
Anesthesia Evaluation  Patient identified by MRN, date of birth, ID band Patient awake    Reviewed: Allergy & Precautions, NPO status , Patient's Chart, lab work & pertinent test results  Airway Mallampati: II  TM Distance: >3 FB     Dental  (+) Missing,    Pulmonary former smoker   Pulmonary exam normal        Cardiovascular hypertension, Pt. on medications and Pt. on home beta blockers +CHF  + Valvular Problems/Murmurs MR  Rhythm:Regular Rate:Normal     Neuro/Psych negative neurological ROS  negative psych ROS   GI/Hepatic Neg liver ROS,GERD  Medicated,,FOBT+   Endo/Other  negative endocrine ROS    Renal/GU negative Renal ROS   Prostate Ca    Musculoskeletal  (+) Arthritis , Osteoarthritis,    Abdominal Normal abdominal exam  (+)   Peds  Hematology  (+) Blood dyscrasia, anemia   Anesthesia Other Findings   Reproductive/Obstetrics                             Anesthesia Physical Anesthesia Plan  ASA: 3  Anesthesia Plan: MAC   Post-op Pain Management: Minimal or no pain anticipated   Induction: Intravenous  PONV Risk Score and Plan: 1 and Propofol infusion, Treatment may vary due to age or medical condition and Ondansetron  Airway Management Planned: Natural Airway, Simple Face Mask and Nasal Cannula  Additional Equipment: None  Intra-op Plan:   Post-operative Plan:   Informed Consent: I have reviewed the patients History and Physical, chart, labs and discussed the procedure including the risks, benefits and alternatives for the proposed anesthesia with the patient or authorized representative who has indicated his/her understanding and acceptance.   Patient has DNR.   Dental advisory given  Plan Discussed with: CRNA and Anesthesiologist  Anesthesia Plan Comments: (   ECHO 06/22: 1. Moderate to severe mitral regurgitation is present, likely severe. The  jet is  eccentric and anteriorly directed. There is likely pulmonary vein  flow reversal in systole in the right inferior pulmonary vein. There is  splay artifact present, indicating  at least moderate severity. The jet appears to wrap around the LA,  indicative of severe MR. RVSP elevated. There is no obvious flail segment,  but suspect this is not well seen. Would recommend a TEE for  characterization of the MR. The mitral valve is  grossly normal. Moderate to severe mitral valve regurgitation. No evidence  of mitral stenosis.  2. Left ventricular ejection fraction, by estimation, is 60 to 65%. The  left ventricle has normal function. The left ventricle has no regional  wall motion abnormalities. There is severe asymmetric left ventricular  hypertrophy of the basal-septal  segment. Indeterminate diastolic filling due to E-A fusion.  3. Right ventricular systolic function is normal. The right ventricular  size is normal. There is mildly elevated pulmonary artery systolic  pressure. The estimated right ventricular systolic pressure is 123XX123 mmHg.  4. The aortic valve is tricuspid. Aortic valve regurgitation is mild to  moderate. Mild aortic valve sclerosis is present, with no evidence of  aortic valve stenosis.  5. The inferior vena cava is normal in size with greater than 50%  respiratory variability, suggesting right atrial pressure of 3 mmHg.  6. There is a large cystic structure located inferior to the liver, which  could represent a cyst. Would recommend dedicated liver imaging. )        Anesthesia Quick Evaluation

## 2022-08-14 NOTE — Consult Note (Signed)
Consultation Note   Referring Provider:  [facility] PCP: Tonia Ghent, MD Primary Gastroenterologist: Harl Bowie, MD Reason for consultation: anemia, [facility] Day: 2  ASSESSMENT  # 89 yo male with recurrent acute on chronic [place] anemia, FOBT+.Marland Kitchen Hx of friable gastric polyps Hx of colon AVMs  No iron deficient by labs. He takes oral iron. No overt GI bleeding, stools black on iron per wife. Occult GI bleed possible 2/2 to gastric polyps. Intestinal AVMs possible. Colon neoplasm not excluded  # Remote history of a small tubular adenomatous colon polyp  # History of gastric polyps.  # Weight loss. Per Epic he is down from 160's in mid Feb to 138 pounds  Of note a non-contrast CT scan in May 2023 didn't show any malignancies  Has hx of heart failure and wife says he previously had a lot of fluid build up  # AKI on CKD 4  PLAN -Schedule for EGD. The risks and benefits of EGD with possible biopsies were discussed with the patient who agrees to proceed.  - continue BID PPI - Hgb improved to 9.1 post 2 u pRBCs  HPI:  Patient is a 89 y.o. year old male with a past medical history of  iron deficiency anemia, polycystic kidney disease, CKD 4, colon AVMs, adenomatous colon polyps, HFrEF 30-35% , emphysema.   See [facility] for any additional medical problems.  Patient presented to ED yesterday for abnormal labs done at PCP's. ED workup: Hgb 5.8 ( baseline ~ 8) , MCV 87, BUN 91, Cr 5.76. FOBT+  Patient hasn't had any abdominal pain, No N/V. His stools are black on iron per wife. He takes daily PPI which controls his reflux symptoms. He has had significant weight loss over the last month. Wife says he previously had a lot of fluid buildup   Previous GI History / Evaluation :   Oct 2013 colonoscopy  for FOBT+ - angiodysplastic lesion in cecum, 3 mm polyp removed, diverticulosis Path - tubular adenoma  November 2013 EGD -  friable gastric polyps Path - hyperplastic polyp. Negative H.pyloir  Feb 2014 EGD - bleeding gastric polyps. Band ligation performed  Nov 2022 EGD - suspected GI bleed - Normal esophagus. - Small hiatal hernia. - Multiple gastric polyps. The largest polyp may be contributed to his anemia. It was esected and retrieved. - Gastric mucosal atrophy. Biopsied. - Normal examined duodenum. Biopsied. - No evidence for recent or active bleeding   Recent Imaging and Labs: DG Chest 2 View  Result Date: 07/23/2022 CLINICAL DATA:  z shortness of breath EXAM: CHEST - 2 VIEW COMPARISON:  Chest x-ray dated May 31, 2022 FINDINGS: Visualized cardiac and mediastinal contours are unchanged. Small bilateral pleural effusions and lower lung predominant heterogeneous opacities. IMPRESSION: 1. Lower lung predominant heterogeneous opacities, differential considerations include pulmonary edema or infection. 2. Small bilateral pleural effusions. Electronically Signed   By: Yetta Glassman M.D.   On: 07/23/2022 12:08   VAS [place] UPPER EXTREMITY ARTERIAL DUPLEX  Result Date: 07/16/2022  UPPER EXTREMITY DUPLEX STUDY Patient Name:  Rick [name]  Date of Exam:   07/16/2022 Medical Rec #: [id number]        Accession #:    [id number] Date  of Birth: January 22, 1929        Patient Gender: M Patient Age:   68 years Exam Location:  [facility] Procedure:      VAS [place] UPPER EXTREMITY ARTERIAL DUPLEX Referring Phys: Servando Snare --------------------------------------------------------------------------------  Indications: New access.  Performing Technologist: Ronal Fear RVS, RCS  Examination Guidelines: A complete evaluation includes B-mode imaging, spectral Doppler, color Doppler, and power Doppler as needed of all accessible portions of each vessel. Bilateral testing is considered an integral part of a complete examination. Limited examinations for reoccurring indications may be performed as noted.  Right Pre-Dialysis  Findings: +-----------------------+----------+--------------------+---------+--------+ Location               PSV (cm/s)Intralum. Diam. (cm)Waveform Comments +-----------------------+----------+--------------------+---------+--------+ Brachial Antecub. fossa39        0.65                triphasic         +-----------------------+----------+--------------------+---------+--------+ Radial Art at Wrist    25        0.43                triphasic         +-----------------------+----------+--------------------+---------+--------+ Ulnar Art at Wrist     25        0.30                triphasic         +-----------------------+----------+--------------------+---------+--------+  Left Pre-Dialysis Findings: +-----------------------+----------+--------------------+---------+--------+ Location               PSV (cm/s)Intralum. Diam. (cm)Waveform Comments +-----------------------+----------+--------------------+---------+--------+ Brachial Antecub. fossa57        0.60                triphasic         +-----------------------+----------+--------------------+---------+--------+ Radial Art at Wrist    29        0.46                biphasic          +-----------------------+----------+--------------------+---------+--------+ Ulnar Art at Wrist     35        0.22                biphasic          +-----------------------+----------+--------------------+---------+--------+  Summary:  Right: No obstruction visualized in the right upper extremity. Left: No obstruction visualized in the left upper extremity. *See table(s) above for measurements and observations. Electronically signed by Servando Snare MD on 07/16/2022 at 1:33:16 PM.    Final    VAS [place] UPPER EXT VEIN MAPPING (PRE-OP AVF)  Result Date: 07/16/2022 UPPER EXTREMITY VEIN MAPPING Patient Name:  Rick [name]  Date of Exam:   07/16/2022 Medical Rec #: [id number]        Accession #:    [id number] Date of Birth: [birth year]         Patient Gender: M Patient Age:   89 years Exam Location:  [facility] Procedure:      VAS [place] UPPER EXT VEIN MAPPING (PRE-OP AVF) Referring Phys: Servando Snare --------------------------------------------------------------------------------  Indications: Pre-access. Performing Technologist: Ronal Fear RVS, RCS  Examination Guidelines: A complete evaluation includes B-mode imaging, spectral Doppler, color Doppler, and power Doppler as needed of all accessible portions of each vessel. Bilateral testing is considered an integral part of a complete examination. Limited examinations for reoccurring indications may be performed as noted. +-----------------+-------------+----------+--------------+ Right Cephalic   Diameter (cm)Depth (cm)   Findings    +-----------------+-------------+----------+--------------+  Dist upper arm                             thrombus    +-----------------+-------------+----------+--------------+ Antecubital fossa                          thrombus    +-----------------+-------------+----------+--------------+ Prox forearm                               thrombus    +-----------------+-------------+----------+--------------+ Mid forearm                             not visualized +-----------------+-------------+----------+--------------+ Dist forearm                            not visualized +-----------------+-------------+----------+--------------+ +--------------+-------------+----------+--------------+ Right Basilic Diameter (cm)Depth (cm)   Findings    +--------------+-------------+----------+--------------+ Prox upper arm                       not visualized +--------------+-------------+----------+--------------+ Mid upper arm                        not visualized +--------------+-------------+----------+--------------+ Dist upper arm                       not visualized  +--------------+-------------+----------+--------------+ +-----------------+-------------+----------+--------------+ Left Cephalic    Diameter (cm)Depth (cm)   Findings    +-----------------+-------------+----------+--------------+ Shoulder             0.16                              +-----------------+-------------+----------+--------------+ Prox upper arm       0.13                              +-----------------+-------------+----------+--------------+ Mid upper arm        0.12                              +-----------------+-------------+----------+--------------+ Dist upper arm                             thrombus    +-----------------+-------------+----------+--------------+ Antecubital fossa                       not visualized +-----------------+-------------+----------+--------------+ Prox forearm                            not visualized +-----------------+-------------+----------+--------------+ Mid forearm                             not visualized +-----------------+-------------+----------+--------------+ +--------------+-------------+----------+--------------+ Left Basilic  Diameter (cm)Depth (cm)   Findings    +--------------+-------------+----------+--------------+ Prox upper arm                       not visualized +--------------+-------------+----------+--------------+ Mid upper arm  not visualized +--------------+-------------+----------+--------------+ Dist upper arm                       not visualized +--------------+-------------+----------+--------------+ Summary: Right: 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        The basilic vein is not well visualized. 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       The basilic vein is not well visualized. *See table(s) above for measurements and observations.  Diagnosing physician: Servando Snare MD Electronically signed by Servando Snare MD on  07/16/2022 at 10:30:55 AM.    Final       Labs:  Recent Labs    08/13/22 2114 08/14/22 0752  WBC 5.3 6.7  HGB 5.8* 9.1*  HCT 19.8* 26.9*  PLT 276 261   Recent Labs    08/13/22 2114 08/14/22 0439  NA 130* 130*  K 3.4* 4.1  CL 95* 94*  CO2 22 21*  GLUCOSE 109* 87  BUN 91* 91*  CREATININE 5.99* 5.76*  CALCIUM 8.1* 7.9*   Recent Labs    08/13/22 2114  PROT 7.2  ALBUMIN 2.1*  AST 23  ALT 8  ALKPHOS 89  BILITOT 0.7   No results for input(s): "HEPBSAG", "HCVAB", "HEPAIGM", "HEPBIGM" in the last 72 hours. No results for input(s): "LABPROT", "INR" in the last 72 hours.  Past Medical History:  Diagnosis Date   Arthritis    GERD (gastroesophageal reflux disease)    History of colon polyps    History of prostate cancer    Hyperlipidemia    Hypertension    Kidney cysts    bilateral.  Renal US   Mitral regurgitation 12/02/2020    Past Surgical History:  Procedure Laterality Date   BIOPSY  05/05/2021   Procedure: BIOPSY;  Surgeon: Thornton Park, MD;  Location: [place];  Service: Gastroenterology;;   COLONOSCOPY     CYSTOSCOPY  02/12/99   biopsy   ESOPHAGEAL BANDING N/A 08/04/2012   Procedure: ESOPHAGEAL BANDING;  Surgeon: Inda Castle, MD;  Location: [facility];  Service: Endoscopy;  Laterality: N/A;   ESOPHAGOGASTRODUODENOSCOPY N/A 08/04/2012   Procedure: ESOPHAGOGASTRODUODENOSCOPY (EGD);  Surgeon: Inda Castle, MD;  Location: [facility];  Service: Endoscopy;  Laterality: N/A;   ESOPHAGOGASTRODUODENOSCOPY (EGD) WITH PROPOFOL N/A 05/05/2021   Procedure: ESOPHAGOGASTRODUODENOSCOPY (EGD) WITH PROPOFOL;  Surgeon: Thornton Park, MD;  Location: [place];  Service: Gastroenterology;  Laterality: N/A;   INGUINAL HERNIA REPAIR  02/1999   Dr. Reece Agar   POLYPECTOMY  05/05/2021   Procedure: POLYPECTOMY;  Surgeon: Thornton Park, MD;  Location: [place];  Service: Gastroenterology;;   PROSTATECTOMY  02/1999    Family History  Problem Relation  Age of Onset   Bone cancer Sister    Stomach cancer Brother    Stomach cancer Brother    Lung cancer Sister    Colon cancer Sister    Stroke Mother    Colon cancer Other        nephew    Prior to Admission medications   Medication Sig Start Date End Date Taking? Authorizing Provider  acetaminophen (TYLENOL) 325 MG tablet Take 2 tablets (650 mg total) by mouth every 6 (six) hours as needed. Patient taking differently: Take 650 mg by mouth every 6 (six) hours as needed for mild pain. 11/01/21  Yes Simaan, Darci Current, PA-C  atorvastatin (LIPITOR) 10 MG tablet Take 1 tablet (10 mg total) by mouth daily. 08/06/22  Yes Tonia Ghent, MD  ferrous sulfate  325 (65 FE) MG tablet Take 1 tablet (325 mg total) by mouth daily with breakfast. 11/21/21 11/21/22 Yes Tonia Ghent, MD  furosemide (LASIX) 40 MG tablet Take 1 tablet (40 mg total) by mouth daily. 06/10/22 09/08/22 Yes Mariel Aloe, MD  isosorbide-hydrALAZINE (BIDIL) 20-37.5 MG tablet Take 1 tablet by mouth 2 (two) times daily. 06/09/22 09/07/22 Yes Mariel Aloe, MD  latanoprost (XALATAN) 0.005 % ophthalmic solution Place 1 drop into both eyes at bedtime.   Yes [provider]  levothyroxine (SYNTHROID) 50 MCG tablet Take 1 tablet (50 mcg total) by mouth daily. Take on empty stomach early in the morning. 12/29/21  Yes Tonia Ghent, MD  metoprolol succinate (TOPROL-XL) 25 MG 24 hr tablet Take 0.5 tablets (12.5 mg total) by mouth daily. 08/05/22 09/04/22 Yes Arrien, Jimmy Picket, MD  omeprazole (PRILOSEC OTC) 20 MG tablet Take 1 tablet (20 mg total) by mouth daily. 12/07/20  Yes Tonia Ghent, MD  tamsulosin (FLOMAX) 0.4 MG CAPS capsule Take 0.4 mg by mouth daily. 05/30/22  Yes [provider]  timolol (BETIMOL) 0.5 % ophthalmic solution Place 1 drop into both eyes 2 (two) times daily.   Yes [provider]    Current Facility-Administered Medications  Medication Dose Route Frequency Provider Last Rate Last  Admin   acetaminophen (TYLENOL) tablet 650 mg  650 mg Oral Q6H PRN Hall, Carole N, DO       melatonin tablet 5 mg  5 mg Oral QHS PRN Kayleen Memos, DO       [START ON 08/17/2022] pantoprazole (PROTONIX) injection 40 mg  40 mg Intravenous Raul Del, MD       pantoprozole (PROTONIX) 80 mg /NS 100 mL infusion  8 mg/hr Intravenous Continuous Dorie Rank, MD 10 mL/hr at 08/13/22 2320 8 mg/hr at 08/13/22 2320   prochlorperazine (COMPAZINE) injection 5 mg  5 mg Intravenous Q6H PRN Irene Pap N, DO       sodium bicarbonate tablet 1,300 mg  1,300 mg Oral BID Irene Pap N, DO   1,300 mg at 08/14/22 [id number]    Allergies as of 08/13/2022 - Review Complete 08/13/2022  Allergen Reaction Noted   Lisinopril  03/22/2012   Nsaids  12/07/2020    Social History   Socioeconomic History   Marital status: Married    Spouse name: Not on file   Number of children: 6   Years of education: Not on file   Highest education level: Not on file  Occupational History   Occupation: retired- Patent attorney, Biochemist, clinical    Comment: at [employer], [employer] Therapist, art: RETIRED  Tobacco Use   Smoking status: Former    Types: Cigarettes    Quit date: 08/26/2001    Years since quitting: 20.9   Smokeless tobacco: Former    Types: Chew   Tobacco comments:    quit about 10 years or more  Vaping Use   Vaping Use: Never used  Substance and Sexual Activity   Alcohol use: Yes   Drug use: No   Sexual activity: Not Currently  Other Topics Concern   Not on file  Social History Narrative   Retired Psychologist, sport and exercise, worked at [employer], [place]   Married 1955   6 kids initially, oldest daughter died after surgery   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, 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   Social Determinants of Health   Financial Resource Strain: Low Risk  (11/27/2021)   Overall Financial Resource Strain ([place])  Difficulty of Paying Living Expenses: Not hard at all  Food Insecurity: No Food Insecurity (08/07/2022)   Hunger Vital  Sign    Worried About Running Out of Food in the Last Year: Never true    Ran Out of Food in the Last Year: Never true  Transportation Needs: No Transportation Needs (08/07/2022)   PRAPARE - Hydrologist (Medical): No    Lack of Transportation (Non-Medical): No  Physical Activity: Insufficiently Active (11/27/2021)   Exercise Vital Sign    Days of Exercise per Week: 7 days    Minutes of Exercise per Session: 10 min  Stress: No Stress Concern Present (11/27/2021)   [place]    Feeling of Stress : Not at all  Social Connections: [place] (11/27/2021)   Social Connection and Isolation Panel [NHANES]    Frequency of Communication with Friends and Family: More than three times a week    Frequency of Social Gatherings with Friends and Family: More than three times a week    Attends Religious Services: More than 4 times per year    Active Member of [employer] or Organizations: Yes    Attends Music therapist: More than 4 times per year    Marital Status: Married  Human resources officer Violence: Not At Risk (11/27/2021)   Humiliation, Afraid, Rape, and Kick questionnaire    Fear of Current or Ex-Partner: No    Emotionally Abused: No    Physically Abused: No    Sexually Abused: No    Review of Systems: All systems reviewed and negative except where noted in HPI.  Physical Exam: Vital signs in last 24 hours: Temp:  [97 F (36.1 C)-97.6 F (36.4 C)] 97.2 F (36.2 C) (03/07 0839) Pulse Rate:  [51-72] 61 (03/07 0839) Resp:  [12-22] 19 (03/07 0839) BP: (114-138)/(57-116) 131/88 (03/07 0839) SpO2:  [93 %-100 %] 94 % (03/07 0839) Weight:  [62.2 kg-62.9 kg] 62.9 kg (03/07 0414) Last BM Date : 07/30/22  General:  Alert thin male in NAD Psych:  Pleasant, cooperative. Normal mood and affect Eyes: Pupils equal Ears:  Normal auditory acuity Nose: No deformity, discharge or  lesions Neck:  Supple, no masses felt Lungs:  Clear to auscultation.  Heart:  Regular rate, regular rhythm.  Abdomen:  Soft, nondistended, nontender, active bowel sounds, no masses felt Rectal :  Deferred Msk: Symmetrical without gross deformities.  Neurologic:  Alert, oriented, grossly normal neurologically Extremities : No edema Skin:  Intact without significant lesions.    Intake/Output from previous day: 03/06 0701 - 03/07 0700 In: 449.7 [Blood:350; IV Piggyback:99.7] Out: -  Intake/Output this shift:  No intake/output data recorded.    Principal Problem:   GI bleed    Tye Savoy, NP-C @  08/14/2022, 11:35 AM

## 2022-08-14 NOTE — Anesthesia Postprocedure Evaluation (Signed)
Anesthesia Post Note  Patient: Rick [name]  Procedure(s) Performed: ESOPHAGOGASTRODUODENOSCOPY (EGD) WITH PROPOFOL HOT HEMOSTASIS (ARGON PLASMA COAGULATION/BICAP) HEMOSTASIS CLIP PLACEMENT     Patient location during evaluation: PACU Anesthesia Type: MAC Level of consciousness: awake and alert Pain management: pain level controlled Vital Signs Assessment: post-procedure vital signs reviewed and stable Respiratory status: spontaneous breathing, nonlabored ventilation, respiratory function stable and patient connected to nasal cannula oxygen Cardiovascular status: stable and blood pressure returned to baseline Postop Assessment: no apparent nausea or vomiting Anesthetic complications: no   No notable events documented.  Last Vitals:  Vitals:   08/14/22 1440 08/14/22 1445  BP: (!) 160/69   Pulse: 65 63  Resp:    Temp:    SpO2: 90% 91%    Last Pain:  Vitals:   08/14/22 1436  TempSrc:   PainSc: 0-No pain                 Kataleena Holsapple

## 2022-08-15 DIAGNOSIS — K922 Gastrointestinal hemorrhage, unspecified: Secondary | ICD-10-CM | POA: Diagnosis not present

## 2022-08-15 LAB — BPAM RBC
Blood Product Expiration Date: 202403122359
Blood Product Expiration Date: 202404012359
ISSUE DATE / TIME: 202403062237
ISSUE DATE / TIME: 202403070147
Unit Type and Rh: 7300
Unit Type and Rh: 7300

## 2022-08-15 LAB — BASIC METABOLIC PANEL
Anion gap: 18 — ABNORMAL HIGH (ref 5–15)
BUN: 86 mg/dL — ABNORMAL HIGH (ref 8–23)
CO2: 17 mmol/L — ABNORMAL LOW (ref 22–32)
Calcium: 7.8 mg/dL — ABNORMAL LOW (ref 8.9–10.3)
Chloride: 98 mmol/L (ref 98–111)
Creatinine, Ser: 5.75 mg/dL — ABNORMAL HIGH (ref 0.61–1.24)
GFR, Estimated: 9 mL/min — ABNORMAL LOW (ref >60)
Glucose, Bld: 96 mg/dL (ref 70–99)
Potassium: 3.4 mmol/L — ABNORMAL LOW (ref 3.5–5.1)
Sodium: 133 mmol/L — ABNORMAL LOW (ref 135–145)

## 2022-08-15 LAB — CBC WITH DIFFERENTIAL/PLATELET
Abs Immature Granulocytes: 0.03 10*3/uL (ref 0.00–0.07)
Basophils Absolute: 0 10*3/uL (ref 0.0–0.1)
Basophils Relative: 1 %
Eosinophils Absolute: 0.1 10*3/uL (ref 0.0–0.5)
Eosinophils Relative: 2 %
HCT: 23.8 % — ABNORMAL LOW (ref 39.0–52.0)
Hemoglobin: 8.1 g/dL — ABNORMAL LOW (ref 13.0–17.0)
Immature Granulocytes: 1 %
Lymphocytes Relative: 8 %
Lymphs Abs: 0.5 10*3/uL — ABNORMAL LOW (ref 0.7–4.0)
MCH: 30 pg (ref 26.0–34.0)
MCHC: 34 g/dL (ref 30.0–36.0)
MCV: 88.1 fL (ref 80.0–100.0)
Monocytes Absolute: 0.5 10*3/uL (ref 0.1–1.0)
Monocytes Relative: 9 %
Neutro Abs: 4.5 10*3/uL (ref 1.7–7.7)
Neutrophils Relative %: 79 %
Platelets: 279 10*3/uL (ref 150–400)
RBC: 2.7 MIL/uL — ABNORMAL LOW (ref 4.22–5.81)
RDW: 18.6 % — ABNORMAL HIGH (ref 11.5–15.5)
WBC: 5.7 10*3/uL (ref 4.0–10.5)
nRBC: 0 % (ref 0.0–0.2)

## 2022-08-15 LAB — TYPE AND SCREEN
ABO/RH(D): B POS
Antibody Screen: NEGATIVE
Unit division: 0
Unit division: 0

## 2022-08-15 MED ORDER — POTASSIUM CHLORIDE CRYS ER 20 MEQ PO TBCR
40.0000 meq | EXTENDED_RELEASE_TABLET | Freq: Once | ORAL | Status: AC
  Administered 2022-08-15: 40 meq via ORAL
  Filled 2022-08-15: qty 2

## 2022-08-15 MED ORDER — DARBEPOETIN ALFA 100 MCG/0.5ML IJ SOSY
100.0000 ug | PREFILLED_SYRINGE | INTRAMUSCULAR | Status: DC
  Administered 2022-08-15: 100 ug via SUBCUTANEOUS
  Filled 2022-08-15: qty 0.5

## 2022-08-15 NOTE — Consult Note (Addendum)
Rick [name]  HISTORY AND PHYSICAL  Rick [name] is an 89 y.o. male.    Chief Complaint: anemia  HPI: Pt is a 73M with a PMH sig for CKD IV/V, HTN, polycystic kidney disease, chronic systolic CHF, and multiple recent admissions for CHF exacerbation who is now seen in consultation at the request of Dr. Pietro Cassis for evaluation and recommendations surrounding AKI on CKD.  Pt presented to ED 08/13/22 in the setting of abnormal labs- Hgb was 5.8 and was FOBT +.  Cr was 5.99, up from recent d/c value of 4.13 08/04/22.  Received 2 u pBRCs with Hgb bump up to 8.1.  He is s/p EGD this AM with hiatal hernia and bleeding AVM within the gastric body which was treated.    He is about to try clear liquids s/p EGD.  Dtr and son-in-law at bedside.    Cr  has improved slightly from admission values, now at 5.7.  Diuretics have been held.  In this setting we are asked to see.    Pt denies f/c, n/v, SOB, CP.  No LE edema.  No dysgeusia, itching, jerking movements, lethargy.    PMH: Past Medical History:  Diagnosis Date   Arthritis    GERD (gastroesophageal reflux disease)    History of colon polyps    History of prostate cancer    Hyperlipidemia    Hypertension    Kidney cysts    bilateral.  Renal US   Mitral regurgitation 12/02/2020   PSH: Past Surgical History:  Procedure Laterality Date   BIOPSY  05/05/2021   Procedure: BIOPSY;  Surgeon: Thornton Park, MD;  Location: [facility];  Service: Gastroenterology;;   COLONOSCOPY     CYSTOSCOPY  02/12/99   biopsy   ESOPHAGEAL BANDING N/A 08/04/2012   Procedure: ESOPHAGEAL BANDING;  Surgeon: Inda Castle, MD;  Location: [facility];  Service: Endoscopy;  Laterality: N/A;   ESOPHAGOGASTRODUODENOSCOPY N/A 08/04/2012   Procedure: ESOPHAGOGASTRODUODENOSCOPY (EGD);  Surgeon: Inda Castle, MD;  Location: [facility];  Service: Endoscopy;  Laterality: N/A;   ESOPHAGOGASTRODUODENOSCOPY (EGD) WITH PROPOFOL N/A 05/05/2021   Procedure:  ESOPHAGOGASTRODUODENOSCOPY (EGD) WITH PROPOFOL;  Surgeon: Thornton Park, MD;  Location: [facility];  Service: Gastroenterology;  Laterality: N/A;   INGUINAL HERNIA REPAIR  02/1999   Dr. Reece Agar   POLYPECTOMY  05/05/2021   Procedure: POLYPECTOMY;  Surgeon: Thornton Park, MD;  Location: [facility];  Service: Gastroenterology;;   PROSTATECTOMY  02/1999    Past Medical History:  Diagnosis Date   Arthritis    GERD (gastroesophageal reflux disease)    History of colon polyps    History of prostate cancer    Hyperlipidemia    Hypertension    Kidney cysts    bilateral.  Renal US   Mitral regurgitation 12/02/2020    Medications:  Scheduled:  atorvastatin  10 mg Oral Daily   ferrous sulfate  325 mg Oral Q breakfast   isosorbide-hydrALAZINE  1 tablet Oral BID   latanoprost  1 drop Both Eyes QHS   levothyroxine  50 mcg Oral Q0600   metoprolol succinate  12.5 mg Oral Daily   [START ON 08/17/2022] pantoprazole  40 mg Intravenous Q12H   tamsulosin  0.4 mg Oral Daily   timolol  1 drop Both Eyes BID    Medications Prior to Admission  Medication Sig Dispense Refill   acetaminophen (TYLENOL) 325 MG tablet Take 2 tablets (650 mg total) by mouth every 6 (six) hours as needed. (Patient taking differently:  Take 650 mg by mouth every 6 (six) hours as needed for mild pain.)     atorvastatin (LIPITOR) 10 MG tablet Take 1 tablet (10 mg total) by mouth daily.     ferrous sulfate 325 (65 FE) MG tablet Take 1 tablet (325 mg total) by mouth daily with breakfast.     furosemide (LASIX) 40 MG tablet Take 1 tablet (40 mg total) by mouth daily. 30 tablet 2   isosorbide-hydrALAZINE (BIDIL) 20-37.5 MG tablet Take 1 tablet by mouth 2 (two) times daily. 60 tablet 2   latanoprost (XALATAN) 0.005 % ophthalmic solution Place 1 drop into both eyes at bedtime.     levothyroxine (SYNTHROID) 50 MCG tablet Take 1 tablet (50 mcg total) by mouth daily. Take on empty stomach early in the morning. 90 tablet 3    metoprolol succinate (TOPROL-XL) 25 MG 24 hr tablet Take 0.5 tablets (12.5 mg total) by mouth daily. 15 tablet 0   omeprazole (PRILOSEC OTC) 20 MG tablet Take 1 tablet (20 mg total) by mouth daily.     tamsulosin (FLOMAX) 0.4 MG CAPS capsule Take 0.4 mg by mouth daily.     timolol (BETIMOL) 0.5 % ophthalmic solution Place 1 drop into both eyes 2 (two) times daily.      ALLERGIES:   Allergies  Allergen Reactions   Lisinopril     cough   Nsaids     Elevated Cr    FAM HX: Family History  Problem Relation Age of Onset   Bone cancer Sister    Stomach cancer Brother    Stomach cancer Brother    Lung cancer Sister    Colon cancer Sister    Stroke Mother    Colon cancer Other        nephew    Social History:   reports that he quit smoking about 20 years ago. His smoking use included cigarettes. He has quit using smokeless tobacco.  His smokeless tobacco use included chew. He reports current alcohol use. He reports that he does not use drugs.  ROS: ROS: all other systems reviewed and are negative except as per HPI  Blood pressure (!) 109/59, pulse (!) 58, temperature (!) 97.4 F (36.3 C), temperature source Axillary, resp. rate (!) 21, height '5\' 8"'$  (1.727 m), weight 62.9 kg, SpO2 96 %. PHYSICAL EXAM: Physical Exam GEN NAD, lying in bed HEENT sclerae anicteric NECK no JVD PULM clear bilaterally no c/w/r CV borderline bradycardic, no r/g ABD soft EXT no LE edema NEURO AAO x 3, hard of hearing SKIN no rashes    Results for orders placed or performed during the hospital encounter of 08/13/22 (from the past 48 hour(s))  Type and screen [place]     Status: None   Collection Time: 08/13/22  9:00 PM  Result Value Ref Range   ABO/RH(D) B POS    Antibody Screen NEG    Sample Expiration 08/16/2022,2359    Unit Number [id number]    Blood Component Type RED CELLS,LR    Unit division 00    Status of Unit ISSUED,FINAL    Transfusion Status OK TO TRANSFUSE     Crossmatch Result Compatible    Unit Number [id number]    Blood Component Type RED CELLS,LR    Unit division 00    Status of Unit ISSUED,FINAL    Transfusion Status OK TO TRANSFUSE    Crossmatch Result      Compatible Performed at [facility]  [address] [place], [place] [address]   CBC with Differential     Status: Abnormal   Collection Time: 08/13/22  9:14 PM  Result Value Ref Range   WBC 5.3 4.0 - 10.5 K/uL   RBC 1.97 (L) 4.22 - 5.81 MIL/uL   Hemoglobin 5.8 (LL) 13.0 - 17.0 g/dL    Comment: REPEATED TO VERIFY THIS CRITICAL RESULT HAS VERIFIED AND BEEN CALLED TO Susa Raring RN BY [facility] AMIREHSANI ON 03 06 2024 AT 2153, AND HAS BEEN READ BACK.     HCT 19.8 (L) 39.0 - 52.0 %   MCV 100.5 (H) 80.0 - 100.0 fL   MCH 29.4 26.0 - 34.0 pg   MCHC 29.3 (L) 30.0 - 36.0 g/dL   RDW 19.2 (H) 11.5 - 15.5 %   Platelets 276 150 - 400 K/uL   nRBC 0.0 0.0 - 0.2 %   Neutrophils Relative % 80 %   Neutro Abs 4.3 1.7 - 7.7 K/uL   Lymphocytes Relative 9 %   Lymphs Abs 0.5 (L) 0.7 - 4.0 K/uL   Monocytes Relative 9 %   Monocytes Absolute 0.5 0.1 - 1.0 K/uL   Eosinophils Relative 1 %   Eosinophils Absolute 0.1 0.0 - 0.5 K/uL   Basophils Relative 1 %   Basophils Absolute 0.0 0.0 - 0.1 K/uL   Immature Granulocytes 0 %   Abs Immature Granulocytes 0.02 0.00 - 0.07 K/uL    Comment: Performed at [facility] [address], [place], [place] [address]  Comprehensive metabolic panel     Status: Abnormal   Collection Time: 08/13/22  9:14 PM  Result Value Ref Range   Sodium 130 (L) 135 - 145 mmol/L   Potassium 3.4 (L) 3.5 - 5.1 mmol/L   Chloride 95 (L) 98 - 111 mmol/L   CO2 22 22 - 32 mmol/L   Glucose, Bld 109 (H) 70 - 99 mg/dL    Comment: Glucose reference range applies only to samples taken after fasting for at least 8 hours.   BUN 91 (H) 8 - 23 mg/dL   Creatinine, Ser 5.99 (H) 0.61 - 1.24 mg/dL   Calcium 8.1 (L) 8.9 - 10.3 mg/dL   Total Protein 7.2 6.5 - 8.1 g/dL    Albumin 2.1 (L) 3.5 - 5.0 g/dL   AST 23 15 - 41 U/L   ALT 8 0 - 44 U/L   Alkaline Phosphatase 89 38 - 126 U/L   Total Bilirubin 0.7 0.3 - 1.2 mg/dL   GFR, Estimated 8 (L) >60 mL/min    Comment: (NOTE) Calculated using the CKD-EPI Creatinine Equation (2021)    Anion gap 13 5 - 15    Comment: Performed at [facility], [place] [address], [place], [place] [address]  POC occult blood, ED     Status: Abnormal   Collection Time: 08/13/22 10:15 PM  Result Value Ref Range   Fecal Occult Bld POSITIVE (A) NEGATIVE  Prepare RBC (crossmatch)     Status: None   Collection Time: 08/13/22 10:18 PM  Result Value Ref Range   Order Confirmation      ORDER PROCESSED BY BLOOD BANK Performed at [facility], [place] [address] [place], [place] [id number]   Basic metabolic panel     Status: Abnormal   Collection Time: 08/14/22  4:39 AM  Result Value Ref Range   Sodium 130 (L) 135 - 145 mmol/L   Potassium 4.1 3.5 - 5.1 mmol/L    Comment: HEMOLYSIS AT  THIS LEVEL MAY AFFECT RESULT   Chloride 94 (L) 98 - 111 mmol/L   CO2 21 (L) 22 - 32 mmol/L   Glucose, Bld 87 70 - 99 mg/dL    Comment: Glucose reference range applies only to samples taken after fasting for at least 8 hours.   BUN 91 (H) 8 - 23 mg/dL   Creatinine, Ser 5.76 (H) 0.61 - 1.24 mg/dL   Calcium 7.9 (L) 8.9 - 10.3 mg/dL   GFR, Estimated 9 (L) >60 mL/min    Comment: (NOTE) Calculated using the CKD-EPI Creatinine Equation (2021)    Anion gap 15 5 - 15    Comment: Performed at [facility] [address] [place], [place] [address]  Magnesium     Status: None   Collection Time: 08/14/22  4:39 AM  Result Value Ref Range   Magnesium 2.3 1.7 - 2.4 mg/dL    Comment: Performed at [facility] [address], [place], [place] [address]  Phosphorus     Status: Abnormal   Collection Time: 08/14/22  4:39 AM  Result Value Ref Range   Phosphorus 4.8 (H) 2.5 - 4.6 mg/dL    Comment: Performed at [facility] [address] [place], [place] [address]  CBC     Status: Abnormal   Collection Time: 08/14/22  7:52 AM  Result Value Ref Range   WBC 6.7 4.0 - 10.5 K/uL   RBC 3.09 (L) 4.22 - 5.81 MIL/uL   Hemoglobin 9.1 (L) 13.0 - 17.0 g/dL    Comment: REPEATED TO VERIFY POST TRANSFUSION SPECIMEN    HCT 26.9 (L) 39.0 - 52.0 %   MCV 87.1 80.0 - 100.0 fL    Comment: POST TRANSFUSION SPECIMEN REPEATED TO VERIFY    MCH 29.4 26.0 - 34.0 pg   MCHC 33.8 30.0 - 36.0 g/dL   RDW 18.1 (H) 11.5 - 15.5 %   Platelets 261 150 - 400 K/uL   nRBC 0.0 0.0 - 0.2 %    Comment: Performed at [facility], [place] [address] [place], [place] [id number]  Basic metabolic panel     Status: Abnormal   Collection Time: 08/15/22  4:34 AM  Result Value Ref Range   Sodium 133 (L) 135 - 145 mmol/L   Potassium 3.4 (L) 3.5 - 5.1 mmol/L   Chloride 98 98 - 111 mmol/L   CO2 17 (L) 22 - 32 mmol/L   Glucose, Bld 96 70 - 99 mg/dL    Comment: Glucose reference range applies only to samples taken after fasting for at least 8 hours.   BUN 86 (H) 8 - 23 mg/dL   Creatinine, Ser 5.75 (H) 0.61 - 1.24 mg/dL   Calcium 7.8 (L) 8.9 - 10.3 mg/dL   GFR, Estimated 9 (L) >60 mL/min    Comment: (NOTE) Calculated using the CKD-EPI Creatinine Equation (2021)    Anion gap 18 (H) 5 - 15    Comment: Performed at [facility] [address], [place], [place] [address]  CBC with Differential/Platelet     Status: Abnormal   Collection Time: 08/15/22  4:34 AM  Result Value Ref Range   WBC 5.7 4.0 - 10.5 K/uL   RBC 2.70 (L) 4.22 - 5.81 MIL/uL   Hemoglobin 8.1 (L) 13.0 - 17.0 g/dL   HCT 23.8 (L) 39.0 - 52.0 %   MCV 88.1 80.0 - 100.0 fL   MCH 30.0 26.0 - 34.0 pg  MCHC 34.0 30.0 - 36.0 g/dL   RDW 18.6 (H) 11.5 - 15.5 %   Platelets 279 150 - 400 K/uL   nRBC 0.0 0.0 - 0.2 %   Neutrophils Relative % 79 %   Neutro Abs 4.5 1.7 - 7.7 K/uL   Lymphocytes Relative 8 %   Lymphs Abs 0.5 (L) 0.7 - 4.0 K/uL   Monocytes Relative 9 %   Monocytes Absolute 0.5 0.1  - 1.0 K/uL   Eosinophils Relative 2 %   Eosinophils Absolute 0.1 0.0 - 0.5 K/uL   Basophils Relative 1 %   Basophils Absolute 0.0 0.0 - 0.1 K/uL   Immature Granulocytes 1 %   Abs Immature Granulocytes 0.03 0.00 - 0.07 K/uL    Comment: Performed at [facility] [address], [place], [place] [address]    No results found.  Assessment/Plan  AKI on CKD IV/V: multiple admissions over the past several months.  He has a very narrow window of euvolemia. - OK to hold diuretics for today - likely will place back on PO Lasix tomorrow - hopefully will improve with correction of anemia - he is a poor candidate for dialysis- fortunately no uremic symptoms - prior conversations with pt and wife- full scope of care; however may be helpful to readdress this   2.  Chronic systolic CHF:  - EF [id number], appears euvolemic  - Lasix as above  3.  GIB:  - bleeding AVM found and treated  - appreciate GI  - s/p 2 u PRBCs  - will give Aranesp today 100 mcg  4.  Dispo: pending  Debera Sterba 08/15/2022, 1:25 PM

## 2022-08-15 NOTE — Care Management Important Message (Signed)
Important Message  Patient Details  Name: Rick [name] MRN: [id number] Date of Birth: Mar 23, 1929   Medicare Important Message Given:  Yes     Hannah Beat 08/15/2022, 12:21 PM

## 2022-08-15 NOTE — Progress Notes (Signed)
PROGRESS NOTE  Rick [name]  DOB: [birth year]  PCP: Tonia Ghent, MD [id number]  DOA: 08/13/2022  LOS: 2 days  Hospital Day: 3  Brief narrative: Rick [name] is a 89 y.o. male with PMH significant for HTN, HLD, systolic CHF, GERD, CKD, prostate cancer, gastric polyps who was recently hospitalized 2/14 to 2/26 for volume overload for which she was aggressively diuresed (14 L negative balance) and was discharged on CHF regimen including Lasix. 3/6, patient had a post hospital appointment with his nephrologist.  Labs showed multiple abnormalities including low hemoglobin and elevated creatinine and hence sent to the ED.  In the ED, patient was afebrile, hemodynamically stable Family endorsed seeing dark stool for few days which they suspected was from iron supplement. FOBT was positive Labs in the ED showed a sodium of 130, potassium 3.4, BUN/creatinine 91/5.99, hemoglobin 5.8 EDP discussed the case with [facility], Dr. Modena Nunnery. Patient was started on Protonix drip  Admitted to [facility]  Subjective Patient was seen and examined this morning.   Sitting up on recliner.  Not in distress.  Wife at bedside..    Assessment and plan: Acute GI bleed History of multiple gastric polyps Presented with dark stool, low hemoglobin, FOBT positive Started on Protonix drip.  GI consulted.   3/7, underwent EGD, noted to have 4 cm hiatal hernia, tortuous esophagus, 2 gastric polyps, single actively bleeding angiodysplastic lesion in the gastric body which was treated with APC.  Clips were placed. Patient is currently on Protonix 40 mg twice daily. Tolerated liquid diet.  Advance to soft diet.  Acute blood loss anemia  Chronic iron deficiency anemia secondary to GI bleeding Hemoglobin low at 5.8 at presentation.  2 units of PRBC transfused.  Repeat hemoglobin next morning was better at 9.1.  Hemoglobin is morning is down at 8.1.  Will repeat hemoglobin tomorrow.  If stable, plan to discharge home  tomorrow. Continue iron supplement Recent Labs    11/21/21 0948 12/25/21 1004 12/28/21 0329 01/06/22 0000 05/31/22 1146 07/11/22 0849 07/11/22 0853 07/25/22 1030 07/27/22 0101 08/03/22 2122 08/04/22 0149 08/13/22 2114 08/14/22 0752 08/15/22 0434  HGB 9.7* 9.4*   < > 11.1*   < >  --    < >  --    < > 8.2* 8.1* 5.8* 9.1* 8.1*  MCV 83.5 86.1   < >  --    < >  --    < >  --    < >  --  86.2 100.5* 87.1 88.1  FERRITIN  --   --   --  124  --  56  --  76  --   --   --   --   --   --   TIBC  --   --   --   --   --  158*  --  134*  --   --   --   --   --   --   IRON 32* 35*  --  79  --  21*  --  51  --   --   --   --   --   --    < > = values in this interval not displayed.   Chronic systolic CHF  Last echo [id number] with EF 30 to 35% Euvolemic on exam PTA on Toprol 0.5 mg daily, Lasix 40 mg daily, isosorbide 20 mg twice daily, hydralazine 37.5 mg twice daily Toprol has been resumed.  Others remain  on hold.  Continue monitor hemodynamics.  AKI on CKD 4 Baseline creatinine 4.1 on 2/26. Presented with creatinine elevated to 5.99 on 3/6. Nephrology consultation called. Recent Labs    07/29/22 0109 07/30/22 0124 07/31/22 0041 08/01/22 0237 08/02/22 0145 08/03/22 [id number] 08/04/22 0149 08/13/22 2114 08/14/22 0439 08/15/22 0434  BUN 35* 38* 41* 43* 44* 50* 58* 91* 91* 86*  CREATININE 4.13* 4.28* 4.27* 4.16* 3.95* 3.88* 4.13* 5.99* 5.76* 5.75*  CO2 '30 28 31 30 29 24 26 22 '$ 21* 17*     Hypokalemia/hyperphosphatemia Potassium level low at 3.4 today.  Replacement given. Recent Labs  Lab 08/13/22 2114 08/14/22 0439 08/15/22 0434  K 3.4* 4.1 3.4*  MG  --  2.3  --   PHOS  --  4.8*  --    Hyponatremia Low sodium level but better than last hospitalization.  Probably related to CHF. Continue to monitor. Recent Labs  Lab 08/13/22 2114 08/14/22 0439 08/15/22 0434  NA 130* 130* 133*   HLD Lipitor 10 mg daily  Hypothyroidism Continue Synthroid 50 mcg daily  BPH Continue  Flomax     Mobility: Ambulates with a walker and cane at home  Goals of care   Code Status: Full Code  Family would like to continue full CODE STATUS for now.   DVT prophylaxis:  SCDs Start: 08/14/22 0154   Antimicrobials: None Fluid: Protonix drip Consultants: GI, nephrology Family Communication: Wife at bedside  Status is: Inpatient Level of care: Progressive   Dispo: Patient is from: Home              Anticipated d/c is to: Pending clinical course Continue in-hospital care because: Pending EGD   Scheduled Meds:  atorvastatin  10 mg Oral Daily   ferrous sulfate  325 mg Oral Q breakfast   isosorbide-hydrALAZINE  1 tablet Oral BID   latanoprost  1 drop Both Eyes QHS   levothyroxine  50 mcg Oral Q0600   metoprolol succinate  12.5 mg Oral Daily   [START ON 08/17/2022] pantoprazole  40 mg Intravenous Q12H   tamsulosin  0.4 mg Oral Daily   timolol  1 drop Both Eyes BID    PRN meds: acetaminophen, melatonin, prochlorperazine   Infusions:     Diet:  Diet Order             DIET SOFT Room service appropriate? Yes; Fluid consistency: Thin  Diet effective 1400           Diet full liquid Room service appropriate? Yes; Fluid consistency: Thin  Diet effective now                   Antimicrobials: Anti-infectives (From admission, onward)    None       Skin assessment:       Nutritional status:  Body mass index is 21.08 kg/m.          Objective: Vitals:   08/15/22 0400 08/15/22 0828  BP: 114/64 130/70  Pulse:  (!) 55  Resp:  18  Temp: (!) 97.4 F (36.3 C) (!) 97.3 F (36.3 C)  SpO2:  98%    Intake/Output Summary (Last 24 hours) at 08/15/2022 1102 Last data filed at 08/14/2022 2018 Gross per 24 hour  Intake 900 ml  Output 400 ml  Net 500 ml   Filed Weights   08/13/22 1839 08/14/22 0414 08/14/22 1308  Weight: 62.2 kg 62.9 kg 62.9 kg   Weight change: 0.7 kg Body mass index is 21.08 kg/m.  Physical Exam: General exam: Pleasant,  elderly African-American male.  Not in distress Skin: No rashes, lesions or ulcers. HEENT: Atraumatic, normocephalic, no obvious bleeding Lungs: Clear to auscultation bilaterally CVS: Regular rate and rhythm, no murmur GI/Abd soft, nontender, nondistended, bowel sound present CNS: Alert, awake, able to answer simple questions Psychiatry: Mood appropriate Extremities: No pedal edema, no calf tenderness  Data Review: I have personally reviewed the laboratory data and studies available.  F/u labs ordered Unresulted Labs (From admission, onward)     Start     Ordered   08/15/22 [id number]  Basic metabolic panel  Daily,   R      08/14/22 1127   08/15/22 0500  CBC with Differential/Platelet  Daily,   R      08/14/22 1127            Total time spent in review of labs and imaging, patient evaluation, formulation of plan, documentation and communication with family: 31 minutes  Signed, Terrilee Croak, MD [facility] 08/15/2022

## 2022-08-15 NOTE — Evaluation (Addendum)
Physical Therapy Evaluation Patient Details Name: Rick [name] MRN: [id number] DOB: 01-Feb-1929 Today's Date: 08/15/2022  History of Present Illness  89 yo male presenting 3/6 with abnormal labs (Hgb to 5.8 from 8.1 9 days ago, elevated creatinine to 5.99). Pt s/p EGD for assessment of upper GI bleed. PMH includes: HTN, HLD, systolic CHF, GERD, CKD, prostate cancer, gastric polyps who was recently hospitalized 2/14 to 2/26 for volume overload.   Clinical Impression  Pt in bed upon arrival of PT, agreeable to evaluation at this time. Prior to admission the pt was ambulating with use of rollator, reports no falls since last admission and able to manage with family at home. The pt now presents with limitations in functional mobility, endurance, strength, and dynamic stability due to above dx, and will continue to benefit from skilled PT to address these deficits. He was able to complete bed mobility, transfers, and short distance (15-20 ft) ambulation in the room with use of minA and RW. The pt was most limited by confusion with external catheter and incontinence of urine in standing at this time. He does report fatigue with repeated sit-stand transfers from recliner and progressed from minG to [place] after 4 reps. Will continue to benefit from skilled PT acutely and resuming HHPT after d/c, family remains supportive and desires to take the pt home when medically ready.      Recommendations for follow up therapy are one component of a multi-disciplinary discharge planning process, led by the attending physician.  Recommendations may be updated based on patient status, additional functional criteria and insurance authorization.  Follow Up Recommendations Home health PT Can patient physically be transported by private vehicle: Yes    Assistance Recommended at Discharge Frequent or constant Supervision/Assistance  Patient can return home with the following  A little help with walking and/or transfers;A  little help with bathing/dressing/bathroom;Assistance with cooking/housework;Direct supervision/assist for medications management;Assist for transportation;Help with stairs or ramp for entrance    Equipment Recommendations None recommended by PT  Recommendations for Other Services  OT consult    Functional Status Assessment Patient has had a recent decline in their functional status and demonstrates the ability to make significant improvements in function in a reasonable and predictable amount of time.     Precautions / Restrictions Precautions Precautions: Fall Precaution Comments: incontinent of urine in standing Restrictions Weight Bearing Restrictions: No      Mobility  Bed Mobility Overal bed mobility: Needs Assistance Bed Mobility: Supine to Sit     Supine to sit: Min assist     General bed mobility comments: minA wiht increased time    Transfers Overall transfer level: Needs assistance Equipment used: Rolling walker (2 wheels) Transfers: Sit to/from Stand Sit to Stand: Min assist, Min guard           General transfer comment: minA initially, then minG until pt fatigued after 4 consecutive stands    Ambulation/Gait Ambulation/Gait assistance: Min assist Gait Distance (Feet): 15 Feet (+ 38f) Assistive device: Rolling walker (2 wheels) Gait Pattern/deviations: Step-through pattern, Decreased stride length, Wide base of support, Trunk flexed Gait velocity: decr Gait velocity interpretation: <1.31 ft/sec, indicative of household ambulator   General Gait Details: cues for keeping RW closer to self, upright posture. increased time     Balance Overall balance assessment: Needs assistance Sitting-balance support: Feet supported Sitting balance-Leahy Scale: Fair     Standing balance support: Bilateral upper extremity supported, During functional activity Standing balance-Leahy Scale: Poor  Pertinent Vitals/Pain  Pain Assessment Pain Assessment: No/denies pain Faces Pain Scale: No hurt Pain Intervention(s): Limited activity within patient's tolerance, Monitored during session    [place] expects to be discharged to:: Private residence Living Arrangements: Spouse/significant other Available Help at Discharge: Family;Available 24 hours/day Type of Home: House Home Access: Stairs to enter Entrance Stairs-Rails: Right Entrance Stairs-Number of Steps: 2   Home Layout: One level Home Equipment: Cane - single point;Rollator (4 wheels);Rolling Walker (2 wheels);Toilet riser;BSC/3in1 Additional Comments: using RW for most mobility per pt    Prior Function Prior Level of Function : Independent/Modified Independent             Mobility Comments: uses rollator more than cane ADLs Comments: family involved and supportive to help his wife     Hand Dominance   Dominant Hand: Right    Extremity/Trunk Assessment   Upper Extremity Assessment Upper Extremity Assessment: Generalized weakness    Lower Extremity Assessment Lower Extremity Assessment: Generalized weakness    Cervical / Trunk Assessment Cervical / Trunk Assessment: Kyphotic  Communication   Communication: HOH  Cognition Arousal/Alertness: Awake/alert Behavior During Therapy: Flat affect Overall Cognitive Status: Impaired/Different from baseline Area of Impairment: Following commands, Safety/judgement, Awareness, Problem solving, Memory                     Memory: Decreased recall of precautions, Decreased short-term memory Following Commands: Follows one step commands consistently, Follows one step commands with increased time Safety/Judgement: Decreased awareness of safety Awareness: Emergent Problem Solving: Slow processing, Requires verbal cues General Comments: pt pleasant, HOH, and needing multiple cues to safely manage with lines. unable to understand external catheter and rectal pouch,  insistent on going to bathroom where he was incontinent of urine with each stand.        General Comments General comments (skin integrity, edema, etc.): VSS on RA    Exercises     Assessment/Plan    PT Assessment Patient needs continued PT services  PT Problem List Decreased strength;Decreased range of motion;Decreased activity tolerance;Decreased balance;Decreased mobility;Decreased coordination;Decreased knowledge of use of DME;Decreased safety awareness;Cardiopulmonary status limiting activity       PT Treatment Interventions DME instruction;Gait training;Functional mobility training;Therapeutic activities;Therapeutic exercise;Balance training;Neuromuscular re-education;Patient/family education;Stair training    PT Goals (Current goals can be found in the Care Plan section)  Acute Rehab PT Goals Patient Stated Goal: to get walking again PT Goal Formulation: With patient/family Time For Goal Achievement: 08/29/22 Potential to Achieve Goals: Good    Frequency Min 3X/week        AM-PAC PT "6 Clicks" Mobility  Outcome Measure Help needed turning from your back to your side while in a flat bed without using bedrails?: A Little Help needed moving from lying on your back to sitting on the side of a flat bed without using bedrails?: A Little Help needed moving to and from a bed to a chair (including a wheelchair)?: A Little Help needed standing up from a chair using your arms (e.g., wheelchair or bedside chair)?: A Little Help needed to walk in hospital room?: A Little Help needed climbing 3-5 steps with a railing? : A Lot 6 Click Score: 17    End of Session Equipment Utilized During Treatment: Gait belt Activity Tolerance: Patient tolerated treatment well Patient left: in chair;with call bell/phone within reach;with chair alarm set;with family/visitor present Nurse Communication: Mobility status PT Visit Diagnosis: Unsteadiness on feet (R26.81);Muscle weakness (generalized)  (M62.81);Difficulty in walking, not elsewhere classified (R26.2)  Time: [id number] PT Time Calculation (min) (ACUTE ONLY): 42 min   Charges:   PT Evaluation $PT Eval Low Complexity: 1 Low PT Treatments $Gait Training: 8-22 mins $Therapeutic Activity: 8-22 mins        [place] Carbo, PT, DPT   Acute Rehabilitation Department Office [phone] Secure Chat Communication Preferred  Sandra Cockayne 08/15/2022, 9:26 AM

## 2022-08-16 DIAGNOSIS — K922 Gastrointestinal hemorrhage, unspecified: Secondary | ICD-10-CM | POA: Diagnosis not present

## 2022-08-16 LAB — CBC WITH DIFFERENTIAL/PLATELET
Abs Immature Granulocytes: 0.02 10*3/uL (ref 0.00–0.07)
Basophils Absolute: 0 10*3/uL (ref 0.0–0.1)
Basophils Relative: 1 %
Eosinophils Absolute: 0.1 10*3/uL (ref 0.0–0.5)
Eosinophils Relative: 3 %
HCT: 22.9 % — ABNORMAL LOW (ref 39.0–52.0)
Hemoglobin: 7.6 g/dL — ABNORMAL LOW (ref 13.0–17.0)
Immature Granulocytes: 0 %
Lymphocytes Relative: 10 %
Lymphs Abs: 0.5 10*3/uL — ABNORMAL LOW (ref 0.7–4.0)
MCH: 29.2 pg (ref 26.0–34.0)
MCHC: 33.2 g/dL (ref 30.0–36.0)
MCV: 88.1 fL (ref 80.0–100.0)
Monocytes Absolute: 0.6 10*3/uL (ref 0.1–1.0)
Monocytes Relative: 11 %
Neutro Abs: 4.3 10*3/uL (ref 1.7–7.7)
Neutrophils Relative %: 75 %
Platelets: 295 10*3/uL (ref 150–400)
RBC: 2.6 MIL/uL — ABNORMAL LOW (ref 4.22–5.81)
RDW: 18.4 % — ABNORMAL HIGH (ref 11.5–15.5)
WBC: 5.6 10*3/uL (ref 4.0–10.5)
nRBC: 0 % (ref 0.0–0.2)

## 2022-08-16 LAB — BASIC METABOLIC PANEL
Anion gap: 11 (ref 5–15)
BUN: 77 mg/dL — ABNORMAL HIGH (ref 8–23)
CO2: 21 mmol/L — ABNORMAL LOW (ref 22–32)
Calcium: 7.7 mg/dL — ABNORMAL LOW (ref 8.9–10.3)
Chloride: 99 mmol/L (ref 98–111)
Creatinine, Ser: 5.49 mg/dL — ABNORMAL HIGH (ref 0.61–1.24)
GFR, Estimated: 9 mL/min — ABNORMAL LOW (ref >60)
Glucose, Bld: 101 mg/dL — ABNORMAL HIGH (ref 70–99)
Potassium: 3.4 mmol/L — ABNORMAL LOW (ref 3.5–5.1)
Sodium: 131 mmol/L — ABNORMAL LOW (ref 135–145)

## 2022-08-16 MED ORDER — POTASSIUM CHLORIDE 20 MEQ PO PACK
20.0000 meq | PACK | Freq: Once | ORAL | Status: AC
  Administered 2022-08-16: 20 meq via ORAL
  Filled 2022-08-16: qty 1

## 2022-08-16 MED ORDER — FUROSEMIDE 40 MG PO TABS
40.0000 mg | ORAL_TABLET | Freq: Every day | ORAL | Status: DC
  Administered 2022-08-16: 40 mg via ORAL
  Filled 2022-08-16: qty 1

## 2022-08-16 MED ORDER — PANTOPRAZOLE SODIUM 40 MG PO TBEC: 40.0000 mg | DELAYED_RELEASE_TABLET | Freq: Two times a day (BID) | ORAL | 0 refills | Status: DC

## 2022-08-16 NOTE — Progress Notes (Signed)
Mobility Specialist Progress Note    08/16/22 1144  Mobility  Activity Ambulated with assistance in hallway  Level of Assistance Minimal assist, patient does 75% or more  Assistive Device Front wheel walker  Distance Ambulated (ft) 140 ft  Activity Response Tolerated well  Mobility Referral Yes  $Mobility charge 1 Mobility   Pt received in chair and agreeable. No complaints on walk. Returned to chair with call bell in reach.   Hildred Alamin Mobility Specialist  Please Psychologist, sport and exercise or Rehab Office at [phone]

## 2022-08-16 NOTE — Plan of Care (Signed)
Going home with family. Will get Home Health

## 2022-08-16 NOTE — TOC Transition Note (Signed)
Transition of Care [facility]) - CM/SW Discharge Note   Patient Details  Name: Rick [name] MRN: [id number] Date of Birth: Dec 31, 1928  Transition of Care [facility]) CM/SW Contact:  Konrad Penta, RN Phone Number: 08/16/2022, 11:59 AM   Clinical Narrative:   Mr. Silverstone to transition home today. Confirmed with Tommi Rumps with [name] that Mr. Donze was active prior with [facility] prior to admission. [name] will resume home health services. Made Cory aware of discharge today. Spoke with spouse [name] to confirm no other TOC needs identified.     Final next level of care: Home w Home Health Services Barriers to Discharge: No Barriers Identified   Patient Goals and CMS Choice CMS Medicare.gov Compare Post Acute Care list provided to:: Patient Represenative (must comment) (spouse) Choice offered to / list presented to : Spouse  Discharge Placement                         Discharge Plan and Services Additional resources added to the After Visit Summary for                            [facility] Arranged: PT (already active with [name]) [place]: [place] Date [facility] Contacted: 08/16/22 Time [place]: [id number] Representative spoke with at [place]: [place] Determinants of Health (SDOH) Interventions SDOH Screenings   Food Insecurity: No Food Insecurity (08/07/2022)  Housing: Low Risk  (06/11/2022)  Transportation Needs: No Transportation Needs (08/07/2022)  Alcohol Screen: Low Risk  (11/27/2021)  Depression (PHQ2-9): Low Risk  (11/27/2021)  Financial Resource Strain: Low Risk  (11/27/2021)  Physical Activity: Insufficiently Active (11/27/2021)  Social Connections: Socially Integrated (11/27/2021)  Stress: No Stress Concern Present (11/27/2021)  Tobacco Use: Medium Risk (08/14/2022)     Readmission Risk Interventions    07/25/2022    4:19 PM  Readmission Risk Prevention Plan  Transportation Screening Complete  PCP or Specialist Appt within 3-5 Days Complete   HRI or [place] Not Complete  HRI or Home Care Consult comments need pt eval  Social Work Consult for [place] Planning/Counseling Not Complete  SW consult not completed comments need pt eval  Palliative Care Screening Not Applicable  Medication Review Press photographer) Complete

## 2022-08-16 NOTE — Progress Notes (Signed)
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  He has a very narrow window of euvolemia. - place back on PO Lasix 40 mg daily- he has a very narrow window of euvolemia so need to be careful - Cr improving - he is a poor candidate for dialysis- fortunately no uremic symptoms - prior conversations with pt and wife- full scope of care; however may be helpful to readdress this    2.  Chronic systolic CHF:             - EF 30-35%, appears euvolemic             - Lasix as above   3.  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  Dispo: pending, from renal perspective seems OK to go with close followup with labs and visit with [name]  Subjective:    Seen in room.  Feeling much better.  Appears comfortable.  Hgb 7.6.  Wife at bedside.   Objective:   BP 119/71 (BP Location: Right Arm)   Pulse 63   Temp 97.7 F (36.5 C) (Oral)   Resp 20   Ht '5\' 8"'$  (1.727 m)   Wt 62.9 kg   SpO2 96%   BMI 21.08 kg/m   Intake/Output Summary (Last 24 hours) at 08/16/2022 0849 Last data filed at 08/15/2022 1500 Gross per 24 hour  Intake --  Output 400 ml  Net -400 ml   Weight change:   Physical Exam: GEN NAD, lying in bed HEENT sclerae anicteric NECK no JVD PULM clear bilaterally no c/w/r CV borderline bradycardic, no r/g ABD soft EXT no LE edema NEURO AAO x 3, hard of hearing SKIN no rashes  Imaging: No results found.  Labs: BMET Recent Labs  Lab 08/13/22 2114 08/14/22 0439 08/15/22 0434 08/16/22 0459  NA 130* 130* 133* 131*  K 3.4* 4.1 3.4* 3.4*  CL 95* 94* 98 99  CO2 22 21* 17* 21*  GLUCOSE 109* 87 96 101*  BUN 91* 91* 86* 77*  CREATININE 5.99* 5.76* 5.75* 5.49*  CALCIUM 8.1* 7.9* 7.8* 7.7*  PHOS  --  4.8*  --   --    CBC Recent Labs  Lab 08/13/22 2114 08/14/22 0752  08/15/22 0434 08/16/22 0459  WBC 5.3 6.7 5.7 5.6  NEUTROABS 4.3  --  4.5 4.3  HGB 5.8* 9.1* 8.1* 7.6*  HCT 19.8* 26.9* 23.8* 22.9*  MCV 100.5* 87.1 88.1 88.1  PLT 276 261 279 295    Medications:     atorvastatin  10 mg Oral Daily   darbepoetin (ARANESP) injection - NON-DIALYSIS  100 mcg Subcutaneous Q Fri-1800   ferrous sulfate  325 mg Oral Q breakfast   isosorbide-hydrALAZINE  1 tablet Oral BID   latanoprost  1 drop Both Eyes QHS   levothyroxine  50 mcg Oral Q0600   metoprolol succinate  12.5 mg Oral Daily   [START ON 08/17/2022] pantoprazole  40 mg Intravenous Q12H   tamsulosin  0.4 mg Oral Daily   timolol  1 drop Both  Eyes BID    Madelon Lips, MD 08/16/2022, 8:49 AM

## 2022-08-16 NOTE — Discharge Summary (Signed)
Physician Discharge Summary  Rick [name] [id number] DOB: February 23, 1929 DOA: 08/13/2022  PCP: Tonia Ghent, MD  Admit date: 08/13/2022 Discharge date: 08/16/2022  Admitted From: Home Discharge disposition: Home with PT  Recommendations at discharge:  Ensure compliance to medications as prescribed.  Watch out for symptoms of CHF exacerbation Protonix twice daily has been started. Follow-up with GI, nephrology as an outpatient   Brief narrative: Rick [name] is a 89 y.o. male with PMH significant for HTN, HLD, systolic CHF, GERD, CKD, prostate cancer, gastric polyps who was recently hospitalized 2/14 to 2/26 for volume overload for which she was aggressively diuresed (14 L negative balance) and was discharged on CHF regimen including Lasix. 3/6, patient had a post hospital appointment with his nephrologist.  Labs showed multiple abnormalities including low hemoglobin and elevated creatinine and hence sent to the ED.  In the ED, patient was afebrile, hemodynamically stable Family endorsed seeing dark stool for few days which they suspected was from iron supplement. FOBT was positive Labs in the ED showed a sodium of 130, potassium 3.4, BUN/creatinine 91/5.99, hemoglobin 5.8 EDP discussed the case with [facility], Dr. Modena Nunnery. Patient was started on Protonix drip  Admitted to [facility]  Subjective Patient was seen and examined this morning.  Lying on bed.  Not in distress.  No longer bleeding.  Happy to go home.  Family at bedside. Sitting up on recliner.  Not in distress.  Wife at bedside.Marland Kitchen    Hospital course: Acute GI bleed History of multiple gastric polyps Presented with dark stool, low hemoglobin, FOBT positive Started on Protonix drip.  GI consulted.   3/7, underwent EGD, noted to have 4 cm hiatal hernia, tortuous esophagus, 2 gastric polyps, single actively bleeding angiodysplastic lesion in the gastric body which was treated with APC.  Clips were placed. Patient is  currently on Protonix 40 mg twice daily. Tolerated soft diet.  Continue regular diet at home as before.  Acute blood loss anemia  Chronic iron deficiency anemia secondary to GI bleeding Hemoglobin low at 5.8 at presentation.  2 units of PRBC transfused.  Hemoglobin improved, likely downtrending still but probably trying to come to an equilibrium.  No further bleeding noted.   Continue iron supplement.  1 dose of Aranesp was given yesterday. Recent Labs    11/21/21 0948 12/25/21 1004 12/28/21 0329 01/06/22 0000 05/31/22 1146 07/11/22 0849 07/11/22 0853 07/25/22 1030 07/27/22 0101 08/04/22 0149 08/13/22 2114 08/14/22 0752 08/15/22 0434 08/16/22 0459  HGB 9.7* 9.4*   < > 11.1*   < >  --    < >  --    < > 8.1* 5.8* 9.1* 8.1* 7.6*  MCV 83.5 86.1   < >  --    < >  --    < >  --    < > 86.2 100.5* 87.1 88.1 88.1  FERRITIN  --   --   --  124  --  56  --  76  --   --   --   --   --   --   TIBC  --   --   --   --   --  158*  --  134*  --   --   --   --   --   --   IRON 32* 35*  --  79  --  21*  --  51  --   --   --   --   --   --    < > =  values in this interval not displayed.   Chronic systolic CHF  Last echo [id number] with EF 30 to 35% Euvolemic on exam PTA on Toprol 12.5 mg daily, Lasix 40 mg daily, isosorbide 20 mg twice daily, hydralazine 37.5 mg twice daily Gradually resumed.  Tolerating currently.  Discharge with the same regimen as before. Continue to follow-up as an outpatient.  AKI on CKD 4 Baseline creatinine 4.1 on 2/26. Presented with creatinine elevated to 5.99 on 3/6.  Lasix was held.  Creatinine gradually down trended, 5.49 today Nephrology consultation appreciated.  Lasix resumed today. Recent Labs    07/30/22 0124 07/31/22 0041 08/01/22 0237 08/02/22 0145 08/03/22 [id number] 08/04/22 0149 08/13/22 2114 08/14/22 0439 08/15/22 0434 08/16/22 0459  BUN 38* 41* 43* 44* 50* 58* 91* 91* 86* 77*  CREATININE 4.28* 4.27* 4.16* 3.95* 3.88* 4.13* 5.99* 5.76* 5.75* 5.49*   CO2 '28 31 30 29 24 26 22 '$ 21* 17* 21*     Hypokalemia/hyperphosphatemia Potassium level low at 3.4 today.  Replacement given. Recent Labs  Lab 08/13/22 2114 08/14/22 0439 08/15/22 0434 08/16/22 0459  K 3.4* 4.1 3.4* 3.4*  MG  --  2.3  --   --   PHOS  --  4.8*  --   --    Hyponatremia Low sodium level but better than last hospitalization.  Probably related to CHF. Continue to monitor.  As an outpatient Recent Labs  Lab 08/13/22 2114 08/14/22 0439 08/15/22 0434 08/16/22 0459  NA 130* 130* 133* 131*   HLD Lipitor 10 mg daily  Hypothyroidism Continue Synthroid 50 mcg daily  BPH Continue Flomax     Mobility: Ambulates with a walker and cane at home  Goals of care   Code Status: Full Code  Family would like to continue full CODE STATUS for now.  Wounds:  - Wound / Incision (Open or Dehisced) 07/24/22 Other (Comment) Coccyx Posterior (Active)  Date First Assessed/Time First Assessed: 07/24/22 1500   Wound Type: Other (Comment)  Location: Coccyx  Location Orientation: Posterior  Present on Admission: Yes    Assessments 07/24/2022  3:00 PM 08/04/2022  7:35 AM  Dressing Type Foam - Lift dressing to assess site every shift Foam - Lift dressing to assess site every shift  Dressing Changed New --  Dressing Status Clean, Dry, Intact Clean, Dry, Intact  Dressing Change Frequency PRN --  Site / Wound Assessment Clean;Dry Clean;Dry  % Wound base Red or Granulating 0% --  % Wound base Yellow/Fibrinous Exudate 0% --  % Wound base Black/Eschar 0% --  % Wound base Other/Granulation Tissue (Comment) 0% --  Peri-wound Assessment Intact --  Wound Length (cm) 2 cm --  Wound Width (cm) 2 cm --  Wound Depth (cm) 1 cm --  Wound Volume (cm^3) 4 cm^3 --  Wound Surface Area (cm^2) 4 cm^2 --  Tunneling (cm) 0 --  Undermining (cm) 0 --  Margins Unattached edges (unapproximated) Attached edges (approximated)  Closure None None  Drainage Amount None --     No associated orders.     Discharge Exam:   Vitals:   08/15/22 2312 08/16/22 0310 08/16/22 0710 08/16/22 0859  BP: 111/66 119/71 125/69 125/69  Pulse: 65 63 65 73  Resp: '20 20 20   '$ Temp: 97.6 F (36.4 C) 97.7 F (36.5 C) 98 F (36.7 C)   TempSrc: Oral Oral Axillary   SpO2: 96% 96% 96%   Weight:      Height:        Body mass  index is 21.08 kg/m.   General exam: Pleasant, elderly African-American male.  Not in distress Skin: No rashes, lesions or ulcers. HEENT: Atraumatic, normocephalic, no obvious bleeding Lungs: Clear to auscultation bilaterally CVS: Regular rate and rhythm, no murmur GI/Abd soft, nontender, nondistended, bowel sound present CNS: Alert, awake, able to answer simple questions Psychiatry: Mood appropriate Extremities: No pedal edema, no calf tenderness  Follow ups:    Follow-up Information     Tonia Ghent, MD Follow up.   Specialty: Family Medicine Contact information: [place] [address] [phone]                 Discharge Instructions:   Discharge Instructions     Call MD for:  difficulty breathing, headache or visual disturbances   Complete by: As directed    Call MD for:  extreme fatigue   Complete by: As directed    Call MD for:  hives   Complete by: As directed    Call MD for:  persistant dizziness or light-headedness   Complete by: As directed    Call MD for:  persistant nausea and vomiting   Complete by: As directed    Call MD for:  severe uncontrolled pain   Complete by: As directed    Call MD for:  temperature >100.4   Complete by: As directed    Diet general   Complete by: As directed    Discharge instructions   Complete by: As directed    Recommendations at discharge:   Ensure compliance to medications as prescribed.  Watch out for symptoms of CHF exacerbation  Protonix twice daily has been started.  Follow-up with GI, nephrology as an outpatient  Discharge instructions for CHF Check weight daily  -preferably same time every day. Restrict fluid intake to 1200 ml daily Restrict salt intake to less than 2 g daily. Call MD if you have one of the following symptoms 1) 3 pound weight gain in 24 hours or 5 pounds in 1 week  2) swelling in the hands, feet or stomach  3) progressive shortness of breath 4) if you have to sleep on extra pillows at night in order to breathe     General discharge instructions: Follow with Primary MD Tonia Ghent, MD in 7 days  Please request your PCP  to go over your hospital tests, procedures, radiology results at the follow up. Please get your medicines reviewed and adjusted.  Your PCP may decide to repeat certain labs or tests as needed. Do not drive, operate heavy machinery, perform activities at heights, swimming or participation in water activities or provide baby sitting services if your were admitted for syncope or siezures until you have seen by Primary MD or a Neurologist and advised to do so again. [place] Controlled Substance Reporting System database was reviewed. Do not drive, operate heavy machinery, perform activities at heights, swim, participate in water activities or provide baby-sitting services while on medications for pain, sleep and mood until your outpatient physician has reevaluated you and advised to do so again.  You are strongly recommended to comply with the dose, frequency and duration of prescribed medications. Activity: As tolerated with Full fall precautions use walker/cane & assistance as needed Avoid using any recreational substances like cigarette, tobacco, alcohol, or non-prescribed drug. If you experience worsening of your admission symptoms, develop shortness of breath, life threatening emergency, suicidal or homicidal thoughts you must seek medical attention immediately by calling 911 or calling  your MD immediately  if symptoms less severe. You must read complete instructions/literature along with all the possible  adverse reactions/side effects for all the medicines you take and that have been prescribed to you. Take any new medicine only after you have completely understood and accepted all the possible adverse reactions/side effects.  Wear Seat belts while driving. You were cared for by a hospitalist during your hospital stay. If you have any questions about your discharge medications or the care you received while you were in the hospital after you are discharged, you can call the unit and ask to speak with the hospitalist or the covering physician. Once you are discharged, your primary care physician will handle any further medical issues. Please note that NO REFILLS for any discharge medications will be authorized once you are discharged, as it is imperative that you return to your primary care physician (or establish a relationship with a primary care physician if you do not have one).   Increase activity slowly   Complete by: As directed        Discharge Medications:   Allergies as of 08/16/2022       Reactions   Lisinopril    cough   Nsaids    Elevated Cr        Medication List     STOP taking these medications    omeprazole 20 MG tablet Commonly known as: PRILOSEC OTC       TAKE these medications    acetaminophen 325 MG tablet Commonly known as: TYLENOL Take 2 tablets (650 mg total) by mouth every 6 (six) hours as needed. What changed: reasons to take this   atorvastatin 10 MG tablet Commonly known as: LIPITOR Take 1 tablet (10 mg total) by mouth daily.   ferrous sulfate 325 (65 FE) MG tablet Take 1 tablet (325 mg total) by mouth daily with breakfast.   furosemide 40 MG tablet Commonly known as: LASIX Take 1 tablet (40 mg total) by mouth daily.   isosorbide-hydrALAZINE 20-37.5 MG tablet Commonly known as: BIDIL Take 1 tablet by mouth 2 (two) times daily.   latanoprost 0.005 % ophthalmic solution Commonly known as: XALATAN Place 1 drop into both eyes at bedtime.    levothyroxine 50 MCG tablet Commonly known as: SYNTHROID Take 1 tablet (50 mcg total) by mouth daily. Take on empty stomach early in the morning.   metoprolol succinate 25 MG 24 hr tablet Commonly known as: TOPROL-XL Take 0.5 tablets (12.5 mg total) by mouth daily.   pantoprazole 40 MG tablet Commonly known as: Protonix Take 1 tablet (40 mg total) by mouth 2 (two) times daily before a meal.   tamsulosin 0.4 MG Caps capsule Commonly known as: FLOMAX Take 0.4 mg by mouth daily.   timolol 0.5 % ophthalmic solution Commonly known as: BETIMOL Place 1 drop into both eyes 2 (two) times daily.         The results of significant diagnostics from this hospitalization (including imaging, microbiology, ancillary and laboratory) are listed below for reference.    Procedures and Diagnostic Studies:   No results found.   Labs:   Basic Metabolic Panel: Recent Labs  Lab 08/13/22 2114 08/14/22 0439 08/15/22 0434 08/16/22 0459  NA 130* 130* 133* 131*  K 3.4* 4.1 3.4* 3.4*  CL 95* 94* 98 99  CO2 22 21* 17* 21*  GLUCOSE 109* 87 96 101*  BUN 91* 91* 86* 77*  CREATININE 5.99* 5.76* 5.75* 5.49*  CALCIUM 8.1* 7.9* 7.8* 7.7*  MG  --  2.3  --   --   PHOS  --  4.8*  --   --    GFR Estimated Creatinine Clearance: 7.8 mL/min (A) (by C-G formula based on SCr of 5.49 mg/dL (H)). Liver Function Tests: Recent Labs  Lab 08/13/22 2114  AST 23  ALT 8  ALKPHOS 89  BILITOT 0.7  PROT 7.2  ALBUMIN 2.1*   No results for input(s): "LIPASE", "AMYLASE" in the last 168 hours. No results for input(s): "AMMONIA" in the last 168 hours. Coagulation profile No results for input(s): "INR", "PROTIME" in the last 168 hours.  CBC: Recent Labs  Lab 08/13/22 2114 08/14/22 0752 08/15/22 0434 08/16/22 0459  WBC 5.3 6.7 5.7 5.6  NEUTROABS 4.3  --  4.5 4.3  HGB 5.8* 9.1* 8.1* 7.6*  HCT 19.8* 26.9* 23.8* 22.9*  MCV 100.5* 87.1 88.1 88.1  PLT 276 261 279 295   Cardiac Enzymes: No results for  input(s): "CKTOTAL", "CKMB", "CKMBINDEX", "TROPONINI" in the last 168 hours. BNP: Invalid input(s): "POCBNP" CBG: No results for input(s): "GLUCAP" in the last 168 hours. D-Dimer No results for input(s): "DDIMER" in the last 72 hours. Hgb A1c No results for input(s): "HGBA1C" in the last 72 hours. Lipid Profile No results for input(s): "CHOL", "HDL", "LDLCALC", "TRIG", "CHOLHDL", "LDLDIRECT" in the last 72 hours. Thyroid function studies No results for input(s): "TSH", "T4TOTAL", "T3FREE", "THYROIDAB" in the last 72 hours.  Invalid input(s): "FREET3" Anemia work up No results for input(s): "VITAMINB12", "FOLATE", "FERRITIN", "TIBC", "IRON", "RETICCTPCT" in the last 72 hours. Microbiology No results found for this or any previous visit (from the past 240 hour(s)).  Time coordinating discharge: 35 minutes  Signed: Jaquel Coomer  [facility] 08/16/2022, 10:50 AM

## 2022-08-17 DIAGNOSIS — I130 Hypertensive heart and chronic kidney disease with heart failure and stage 1 through stage 4 chronic kidney disease, or unspecified chronic kidney disease: Secondary | ICD-10-CM | POA: Diagnosis not present

## 2022-08-17 DIAGNOSIS — I5043 Acute on chronic combined systolic (congestive) and diastolic (congestive) heart failure: Secondary | ICD-10-CM | POA: Diagnosis not present

## 2022-08-17 DIAGNOSIS — E039 Hypothyroidism, unspecified: Secondary | ICD-10-CM | POA: Diagnosis not present

## 2022-08-17 DIAGNOSIS — Z9181 History of falling: Secondary | ICD-10-CM | POA: Diagnosis not present

## 2022-08-17 DIAGNOSIS — G9341 Metabolic encephalopathy: Secondary | ICD-10-CM | POA: Diagnosis not present

## 2022-08-17 DIAGNOSIS — N184 Chronic kidney disease, stage 4 (severe): Secondary | ICD-10-CM | POA: Diagnosis not present

## 2022-08-18 ENCOUNTER — Telehealth: Payer: Self-pay | Admitting: *Deleted

## 2022-08-18 ENCOUNTER — Encounter (HOSPITAL_COMMUNITY): Payer: Self-pay | Admitting: Gastroenterology

## 2022-08-18 DIAGNOSIS — N184 Chronic kidney disease, stage 4 (severe): Secondary | ICD-10-CM | POA: Diagnosis not present

## 2022-08-18 DIAGNOSIS — E039 Hypothyroidism, unspecified: Secondary | ICD-10-CM | POA: Diagnosis not present

## 2022-08-18 DIAGNOSIS — I5043 Acute on chronic combined systolic (congestive) and diastolic (congestive) heart failure: Secondary | ICD-10-CM | POA: Diagnosis not present

## 2022-08-18 DIAGNOSIS — Z9181 History of falling: Secondary | ICD-10-CM | POA: Diagnosis not present

## 2022-08-18 DIAGNOSIS — G9341 Metabolic encephalopathy: Secondary | ICD-10-CM | POA: Diagnosis not present

## 2022-08-18 DIAGNOSIS — I130 Hypertensive heart and chronic kidney disease with heart failure and stage 1 through stage 4 chronic kidney disease, or unspecified chronic kidney disease: Secondary | ICD-10-CM | POA: Diagnosis not present

## 2022-08-18 NOTE — Transitions of Care (Post Inpatient/ED Visit) (Signed)
   08/18/2022  Name: Rick [name] MRN: [id number] DOB: 17-Jan-1929  Today's TOC FU Call Status: Today's TOC FU Call Status:: Successful TOC FU Call Competed TOC FU Call Complete Date: 08/18/22  Transition Care Management Follow-up Telephone Call Date of Discharge: 08/16/22 Discharge Facility: [name]) Type of Discharge: Inpatient Admission Primary Inpatient Discharge Diagnosis:: Gastrointestional bleed How have you been since you were released from the hospital?: Better Any questions or concerns?: No  Items Reviewed: Did you receive and understand the discharge instructions provided?: Yes Medications obtained and verified?: Yes (Medications Reviewed) Any new allergies since your discharge?: No Dietary orders reviewed?: No Do you have support at home?: Yes People in Home: spouse Name of Support/Comfort Primary Source: [place] and Equipment/Supplies: [place] Ordered?: Yes Name of [place]:: [name] Has Agency set up a time to come to your home?: Yes [place] Visit Date: 08/18/22 Any new equipment or medical supplies ordered?: No  Functional Questionnaire: Do you need assistance with meal preparation?: Yes Do you need assistance with eating?: No Do you have difficulty maintaining continence: No Do you need assistance with getting out of bed/getting out of a chair/moving?: Yes Do you have difficulty managing or taking your medications?: Yes  Folllow up appointments reviewed: PCP Follow-up appointment confirmed?: Yes (Care guide scheduledd) Date of PCP follow-up appointment?: 08/21/22 Follow-up Provider: Dr Elsie Stain [phone] 12:30 Specialist Hospital Follow-up appointment confirmed?: NA Do you need transportation to your follow-up appointment?: No Do you understand care options if your condition(s) worsen?: Yes-patient verbalized understanding  SDOH Interventions Today    Flowsheet Row Most Recent Value  SDOH  Interventions   Food Insecurity Interventions Intervention Not Indicated  Housing Interventions Intervention Not Indicated  Transportation Interventions Intervention Not Indicated      Interventions Today    Flowsheet Row Most Recent Value  General Interventions   General Interventions Discussed/Reviewed General Interventions Discussed, General Interventions Reviewed, Doctor Visits  Doctor Visits Discussed/Reviewed Doctor Visits Discussed, Doctor Visits Reviewed  Dossie Arbour guide scheduled visit]  Exercise Interventions   Exercise Discussed/Reviewed Physical Activity  Physical Activity Discussed/Reviewed Physical Activity Discussed, Physical Activity Reviewed  [Physical therapy is there to work with patient]      TOC Interventions Today    Flowsheet Row Most Recent Value  TOC Interventions   TOC Interventions Discussed/Reviewed Arranged PCP follow up within 7 days/Care Guide scheduled        [facility] [phone]

## 2022-08-21 ENCOUNTER — Ambulatory Visit: Payer: Medicare PPO | Admitting: Family Medicine

## 2022-08-21 ENCOUNTER — Encounter: Payer: Self-pay | Admitting: Family Medicine

## 2022-08-21 VITALS — BP 118/62 | HR 59 | Temp 97.6°F | Ht 68.0 in | Wt 137.0 lb

## 2022-08-21 DIAGNOSIS — N179 Acute kidney failure, unspecified: Secondary | ICD-10-CM

## 2022-08-21 DIAGNOSIS — D649 Anemia, unspecified: Secondary | ICD-10-CM | POA: Diagnosis not present

## 2022-08-21 NOTE — Patient Instructions (Signed)
Don't change your meds for now.  Go to the lab on the way out.   If you have mychart we'll likely use that to update you.    Take care.  Glad to see you. I'll update the kidney clinic.  We'll go from there.

## 2022-08-21 NOTE — Progress Notes (Signed)
Admit date: 08/13/2022 Discharge date: 08/16/2022   Admitted From: Home Discharge disposition: Home with PT   Recommendations at discharge:  Ensure compliance to medications as prescribed.  Watch out for symptoms of CHF exacerbation Protonix twice daily has been started. Follow-up with GI, nephrology as an outpatient     Brief narrative: Rick [name] is a 89 y.o. male with PMH significant for HTN, HLD, systolic CHF, GERD, CKD, prostate cancer, gastric polyps who was recently hospitalized 2/14 to 2/26 for volume overload for which she was aggressively diuresed (14 L negative balance) and was discharged on CHF regimen including Lasix. 3/6, patient had a post hospital appointment with his nephrologist.  Labs showed multiple abnormalities including low hemoglobin and elevated creatinine and hence sent to the ED.   In the ED, patient was afebrile, hemodynamically stable Family endorsed seeing dark stool for few days which they suspected was from iron supplement. FOBT was positive Labs in the ED showed a sodium of 130, potassium 3.4, BUN/creatinine 91/5.99, hemoglobin 5.8 EDP discussed the case with [facility], Dr. Modena Nunnery. Patient was started on Protonix drip  Admitted to [facility]   Subjective Patient was seen and examined this morning.  Lying on bed.  Not in distress.  No longer bleeding.  Happy to go home.  Family at bedside. Sitting up on recliner.  Not in distress.  Wife at bedside.Marland Kitchen     Hospital course: Acute GI bleed History of multiple gastric polyps Presented with dark stool, low hemoglobin, FOBT positive Started on Protonix drip.  GI consulted.   3/7, underwent EGD, noted to have 4 cm hiatal hernia, tortuous esophagus, 2 gastric polyps, single actively bleeding angiodysplastic lesion in the gastric body which was treated with APC.  Clips were placed. Patient is currently on Protonix 40 mg twice daily. Tolerated soft diet.  Continue regular diet at home as before.   Acute blood  loss anemia  Chronic iron deficiency anemia secondary to GI bleeding Hemoglobin low at 5.8 at presentation.  2 units of PRBC transfused.  Hemoglobin improved, likely downtrending still but probably trying to come to an equilibrium.  No further bleeding noted.   Continue iron supplement.  1 dose of Aranesp was given yesterday.   Chronic systolic CHF  Last echo [id number] with EF 30 to 35% Euvolemic on exam PTA on Toprol 12.5 mg daily, Lasix 40 mg daily, isosorbide 20 mg twice daily, hydralazine 37.5 mg twice daily Gradually resumed.  Tolerating currently.  Discharge with the same regimen as before. Continue to follow-up as an outpatient.   AKI on CKD 4 Baseline creatinine 4.1 on 2/26. Presented with creatinine elevated to 5.99 on 3/6.  Lasix was held.  Creatinine gradually down trended, 5.49 today Nephrology consultation appreciated.  Lasix resumed today. Recent Labs (within last 365 days)    Hypokalemia/hyperphosphatemia Potassium level low at 3.4 today.  Replacement given. Last Labs   Hyponatremia Low sodium level but better than last hospitalization.  Probably related to CHF. Continue to monitor.  As an outpatient Last Labs   HLD Lipitor 10 mg daily   Hypothyroidism Continue Synthroid 50 mcg daily   BPH Continue Flomax   ===================================  Inpatient course discussed with patient.  GI workup discussed.  EGD report discussed with patient.  Images reviewed.  Rationale for current medications with PPI discussed.  He is less short of breath in the meantime.  Recheck labs are pending for today regarding chronic kidney disease and his hemoglobin.  He is not having black  stools.  He feels better than upon admission.  Discussed that his anemia likely complicated other issues, chronic kidney disease, shortness of breath, etc.  Meds, vitals, and allergies reviewed.   ROS: Per HPI unless specifically indicated in ROS section   GEN: nad, alert and oriented HEENT:  ncat NECK: supple w/o LA CV: rrr.   PULM: ctab, no inc wob ABD: soft, +bs EXT: no edema SKIN: Well-perfused  30 minutes were devoted to patient care in this encounter (this includes time spent reviewing the patient's file/history, interviewing and examining the patient, counseling/reviewing plan with patient).

## 2022-08-22 ENCOUNTER — Telehealth: Payer: Self-pay

## 2022-08-22 DIAGNOSIS — E039 Hypothyroidism, unspecified: Secondary | ICD-10-CM | POA: Diagnosis not present

## 2022-08-22 DIAGNOSIS — I5043 Acute on chronic combined systolic (congestive) and diastolic (congestive) heart failure: Secondary | ICD-10-CM | POA: Diagnosis not present

## 2022-08-22 DIAGNOSIS — G9341 Metabolic encephalopathy: Secondary | ICD-10-CM | POA: Diagnosis not present

## 2022-08-22 DIAGNOSIS — I130 Hypertensive heart and chronic kidney disease with heart failure and stage 1 through stage 4 chronic kidney disease, or unspecified chronic kidney disease: Secondary | ICD-10-CM | POA: Diagnosis not present

## 2022-08-22 DIAGNOSIS — Z9181 History of falling: Secondary | ICD-10-CM | POA: Diagnosis not present

## 2022-08-22 DIAGNOSIS — N184 Chronic kidney disease, stage 4 (severe): Secondary | ICD-10-CM | POA: Diagnosis not present

## 2022-08-22 LAB — CBC WITH DIFFERENTIAL/PLATELET
Basophils Absolute: 0 10*3/uL (ref 0.0–0.1)
Basophils Relative: 0.5 % (ref 0.0–3.0)
Eosinophils Absolute: 0 10*3/uL (ref 0.0–0.7)
Eosinophils Relative: 0.4 % (ref 0.0–5.0)
HCT: 26.1 % — ABNORMAL LOW (ref 39.0–52.0)
Hemoglobin: 8.7 g/dL — ABNORMAL LOW (ref 13.0–17.0)
Lymphocytes Relative: 4.9 % — ABNORMAL LOW (ref 12.0–46.0)
Lymphs Abs: 0.4 10*3/uL — ABNORMAL LOW (ref 0.7–4.0)
MCHC: 33.4 g/dL (ref 30.0–36.0)
MCV: 88 fl (ref 78.0–100.0)
Monocytes Absolute: 0.4 10*3/uL (ref 0.1–1.0)
Monocytes Relative: 4.9 % (ref 3.0–12.0)
Neutro Abs: 7 10*3/uL (ref 1.4–7.7)
Neutrophils Relative %: 89.3 % — ABNORMAL HIGH (ref 43.0–77.0)
Platelets: 341 10*3/uL (ref 150.0–400.0)
RBC: 2.97 Mil/uL — ABNORMAL LOW (ref 4.22–5.81)
RDW: 18.3 % — ABNORMAL HIGH (ref 11.5–15.5)
WBC: 7.9 10*3/uL (ref 4.0–10.5)

## 2022-08-22 LAB — BASIC METABOLIC PANEL
BUN: 66 mg/dL — ABNORMAL HIGH (ref 6–23)
CO2: 19 mEq/L (ref 19–32)
Calcium: 8 mg/dL — ABNORMAL LOW (ref 8.4–10.5)
Chloride: 96 mEq/L (ref 96–112)
Creatinine, Ser: 5.75 mg/dL (ref 0.40–1.50)
GFR: 8.07 mL/min — CL (ref >60.00)
Glucose, Bld: 50 mg/dL — ABNORMAL LOW (ref 70–99)
Potassium: 3.9 mEq/L (ref 3.5–5.1)
Sodium: 130 mEq/L — ABNORMAL LOW (ref 135–145)

## 2022-08-22 NOTE — Telephone Encounter (Signed)
See result note.  

## 2022-08-22 NOTE — Telephone Encounter (Signed)
Saa with [facility] called critical lab reports on creatinine 5.75 and GFR 8.07. sent to Dr [name] and [name] pool and will speak with [facility] CMA. Also logged in lab notebook.

## 2022-08-24 NOTE — Assessment & Plan Note (Signed)
With EGD results discussed.  No black stools in the meantime.  Recheck labs pending.  Continue twice daily PPI in the meantime.  He is less short of breath now.

## 2022-08-24 NOTE — Assessment & Plan Note (Signed)
Recheck labs pending.  Avoid nephrotoxins.  Will follow-up with the renal clinic, we will route labs.  He has resumed Lasix 40 mg a day in the meantime.  Continue BiDil and metoprolol.  Lungs are clear.  Okay for outpatient follow-up.

## 2022-08-25 DIAGNOSIS — I130 Hypertensive heart and chronic kidney disease with heart failure and stage 1 through stage 4 chronic kidney disease, or unspecified chronic kidney disease: Secondary | ICD-10-CM | POA: Diagnosis not present

## 2022-08-25 DIAGNOSIS — Z9181 History of falling: Secondary | ICD-10-CM | POA: Diagnosis not present

## 2022-08-25 DIAGNOSIS — E039 Hypothyroidism, unspecified: Secondary | ICD-10-CM | POA: Diagnosis not present

## 2022-08-25 DIAGNOSIS — G9341 Metabolic encephalopathy: Secondary | ICD-10-CM | POA: Diagnosis not present

## 2022-08-25 DIAGNOSIS — I5043 Acute on chronic combined systolic (congestive) and diastolic (congestive) heart failure: Secondary | ICD-10-CM | POA: Diagnosis not present

## 2022-08-25 DIAGNOSIS — N184 Chronic kidney disease, stage 4 (severe): Secondary | ICD-10-CM | POA: Diagnosis not present

## 2022-08-27 DIAGNOSIS — I130 Hypertensive heart and chronic kidney disease with heart failure and stage 1 through stage 4 chronic kidney disease, or unspecified chronic kidney disease: Secondary | ICD-10-CM | POA: Diagnosis not present

## 2022-08-27 DIAGNOSIS — N184 Chronic kidney disease, stage 4 (severe): Secondary | ICD-10-CM | POA: Diagnosis not present

## 2022-08-27 DIAGNOSIS — E039 Hypothyroidism, unspecified: Secondary | ICD-10-CM | POA: Diagnosis not present

## 2022-08-27 DIAGNOSIS — Z9181 History of falling: Secondary | ICD-10-CM | POA: Diagnosis not present

## 2022-08-27 DIAGNOSIS — G9341 Metabolic encephalopathy: Secondary | ICD-10-CM | POA: Diagnosis not present

## 2022-08-27 DIAGNOSIS — I5043 Acute on chronic combined systolic (congestive) and diastolic (congestive) heart failure: Secondary | ICD-10-CM | POA: Diagnosis not present

## 2022-08-29 ENCOUNTER — Telehealth: Payer: Self-pay | Admitting: Family Medicine

## 2022-08-29 NOTE — Telephone Encounter (Signed)
Patients wife called in stating that patient has been experiencing real liquid diarrhea since yesterday evening. I sent him over to access nurse to be triaged

## 2022-09-01 ENCOUNTER — Ambulatory Visit: Payer: Medicare PPO | Admitting: Family Medicine

## 2022-09-01 ENCOUNTER — Encounter: Payer: Self-pay | Admitting: Family Medicine

## 2022-09-01 VITALS — BP 120/60 | HR 68 | Temp 98.6°F | Ht 68.0 in | Wt 145.0 lb

## 2022-09-01 DIAGNOSIS — K31819 Angiodysplasia of stomach and duodenum without bleeding: Secondary | ICD-10-CM

## 2022-09-01 DIAGNOSIS — D631 Anemia in chronic kidney disease: Secondary | ICD-10-CM

## 2022-09-01 DIAGNOSIS — I5043 Acute on chronic combined systolic (congestive) and diastolic (congestive) heart failure: Secondary | ICD-10-CM | POA: Diagnosis not present

## 2022-09-01 DIAGNOSIS — D509 Iron deficiency anemia, unspecified: Secondary | ICD-10-CM

## 2022-09-01 DIAGNOSIS — I130 Hypertensive heart and chronic kidney disease with heart failure and stage 1 through stage 4 chronic kidney disease, or unspecified chronic kidney disease: Secondary | ICD-10-CM | POA: Diagnosis not present

## 2022-09-01 DIAGNOSIS — N184 Chronic kidney disease, stage 4 (severe): Secondary | ICD-10-CM

## 2022-09-01 DIAGNOSIS — G9341 Metabolic encephalopathy: Secondary | ICD-10-CM | POA: Diagnosis not present

## 2022-09-01 DIAGNOSIS — K922 Gastrointestinal hemorrhage, unspecified: Secondary | ICD-10-CM

## 2022-09-01 DIAGNOSIS — R197 Diarrhea, unspecified: Secondary | ICD-10-CM

## 2022-09-01 DIAGNOSIS — K2970 Gastritis, unspecified, without bleeding: Secondary | ICD-10-CM

## 2022-09-01 DIAGNOSIS — E039 Hypothyroidism, unspecified: Secondary | ICD-10-CM | POA: Diagnosis not present

## 2022-09-01 DIAGNOSIS — Z9181 History of falling: Secondary | ICD-10-CM | POA: Diagnosis not present

## 2022-09-01 NOTE — Telephone Encounter (Signed)
Looks like patient is scheduled with Dr. Glori Bickers this afternoon at 3 pm

## 2022-09-01 NOTE — Telephone Encounter (Signed)
I was unable to reach pt or pts wife but I did speak with Rick [name] pts daughter (DPR signed) and pt did not go to UC or ED over weekend; Rick [name] said pt and pts wife wanted to wait and see how he did. Rick [name] said pt has not had fever, doesn't think pt has seen bright red blood in BM. Rick [name] does not think any abd pain. Pt has not been eating his normal amount of food for awhile. Rick [name] said she will contact her parents this morning and either she or her parents will call with update. Advised have available appts at [facility] so Rick [name] will cb as soon as she can. Sending note to Dr [name] who I also spoke with about above info and sending to [facility] pool.

## 2022-09-01 NOTE — Assessment & Plan Note (Signed)
Reviewed recent hosp with EGD/ labs and GI notes  Now on protoinx 40 mg bid  Also iron daily  Will check with pcp re: plan to see GI?  Cbc and iron levels today  Now having some mucousy loose stool  C diff and stool culture ordered

## 2022-09-01 NOTE — Assessment & Plan Note (Signed)
Noted during last hospitalization  Reviewed hospital records, lab results and studies in detail   Notes some dark stools but this may be due to oral iron  Also loose stool Cbc/iron pending  Will alert pcp

## 2022-09-01 NOTE — Progress Notes (Signed)
Subjective:    Patient ID: Rick [name], male    DOB: 04/29/1929, 89 y.o.   MRN: [id number]  HPI 89 yo pf of Dr [name] presents with c/o diarrhea He has heart failure Also stage 4 CKD  Wt Readings from Last 3 Encounters:  09/01/22 145 lb (65.8 kg)  08/21/22 137 lb (62.1 kg)  08/14/22 138 lb 10.7 oz (62.9 kg)   22.05 kg/m  Vitals:   09/01/22 1457  BP: 120/60  Pulse: 68  Temp: 98.6 F (37 C)  SpO2: 97%    H/o past GI bleeding  Was hosp for this and had gastric polyps and angiodysplatic lesion in gastric body-was tx with APC and clips (early this month)  This caused anemia  with hb as low as 5.8   Lab Results  Component Value Date   WBC 7.9 08/21/2022   HGB 8.7 Repeated and verified X2. (L) 08/21/2022   HCT 26.1 Repeated and verified X2. (L) 08/21/2022   MCV 88.0 08/21/2022   PLT 341.0 08/21/2022    Protonix 40 mg bid   Last colonoscopy 03/2012 - AVM, tics and polyp   Takes iron 325 mg daily    Here today   Had a runny stool on Saturday (this is unusual for him)  Also dark  A little nausea/vomiting - on Saturday  Appetite is poor (he ate some grits this am)   Timy bit of abd pain in the middle  It comes and goes  Thinks it may be from gas   No sick contacts   CKD- sees Dr Wallace Keller  Not on dialysis    Does not have a GI referral planned at this time  Patient Active Problem List   Diagnosis Date Noted   Gastric AVM 08/14/2022   Hypothyroidism Q000111Q   Acute metabolic encephalopathy Q000111Q   Volume overload 07/23/2022   DNR (do not resuscitate) discussion 06/05/2022   Acute combined systolic and diastolic heart failure ([place]) 06/02/2022   Hypoalbuminemia due to protein-calorie malnutrition ([place]) 06/01/2022   Acquired hypothyroidism 06/01/2022   BPH (benign prostatic hyperplasia) 06/01/2022   Hypokalemia 05/31/2022   Metacarpal bone fracture 11/06/2021   Multiple pelvic fractures ([place]) 10/25/2021   Multiple closed pelvic fractures  without disruption of pelvic ring ([place]) 10/22/2021   Gastritis and gastroduodenitis    Gastric polyp    Upper GI bleed 05/02/2021   Anemia due to stage 4 chronic kidney disease ([place]) 05/02/2021   Hyponatremia 05/02/2021   Acute on chronic heart failure with preserved ejection fraction (HFpEF) ([place]) 05/02/2021   Cystic lesion of abdominal viscera 12/11/2020   Severe mitral regurgitation 12/02/2020   Demand ischemia 12/02/2020   Aortic atherosclerosis ([place]) 12/01/2020   Hypertensive urgency 12/01/2020   Polycystic kidney disease 12/01/2020   AKI (acute kidney injury) ([place]) superimposed on CKD stage IV 12/01/2020   Glaucoma 12/01/2020   Diarrhea 12/01/2020   Acute hypoxemic respiratory failure ([place]) 12/01/2020   Chronic kidney disease 12/01/2020   Acute on chronic combined systolic and diastolic CHF (congestive heart failure) ([place]) 11/30/2020   Shoulder pain 05/30/2020   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   Retinal hemorrhage, right eye 05/29/2020   Hypertensive retinopathy, right eye 05/29/2020   Cough 10/25/2017   Health care maintenance 10/22/2016   Anemia, iron deficiency 04/11/2015   Heme positive stool 04/11/2015   Advance care planning 01/16/2015   Benign paroxysmal positional vertigo 01/16/2015   Anemia 01/16/2015   History of prostate cancer 02/02/2014   Bilateral  renal cysts 01/31/2014   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   Family history of malignant neoplasm of gastrointestinal tract 03/04/2012   Medicare annual wellness visit, subsequent 01/23/2012   VENTRAL HERNIA, ASYMPTOMATIC 03/09/2008   TOBACCO ABUSE, HX OF [id number]   HELICOBACTER PYLORI INFECTION 11/25/2006   HYPERCHOLESTEROLEMIA 11/25/2006   Essential hypertension 11/25/2006   GERD without esophagitis 11/25/2006   Hyperglycemia 11/25/2006   Past Medical History:  Diagnosis Date   Arthritis    GERD (gastroesophageal reflux disease)    History of colon polyps    History of prostate cancer     Hyperlipidemia    Hypertension    Kidney cysts    bilateral.  Renal US   Mitral regurgitation 12/02/2020   Past Surgical History:  Procedure Laterality Date   BIOPSY  05/05/2021   Procedure: BIOPSY;  Surgeon: Thornton Park, MD;  Location: [facility];  Service: Gastroenterology;;   COLONOSCOPY     CYSTOSCOPY  02/12/99   biopsy   ESOPHAGEAL BANDING N/A 08/04/2012   Procedure: ESOPHAGEAL BANDING;  Surgeon: Inda Castle, MD;  Location: [facility];  Service: Endoscopy;  Laterality: N/A;   ESOPHAGOGASTRODUODENOSCOPY N/A 08/04/2012   Procedure: ESOPHAGOGASTRODUODENOSCOPY (EGD);  Surgeon: Inda Castle, MD;  Location: [facility];  Service: Endoscopy;  Laterality: N/A;   ESOPHAGOGASTRODUODENOSCOPY (EGD) WITH PROPOFOL N/A 05/05/2021   Procedure: ESOPHAGOGASTRODUODENOSCOPY (EGD) WITH PROPOFOL;  Surgeon: Thornton Park, MD;  Location: [facility];  Service: Gastroenterology;  Laterality: N/A;   ESOPHAGOGASTRODUODENOSCOPY (EGD) WITH PROPOFOL N/A 08/14/2022   Procedure: ESOPHAGOGASTRODUODENOSCOPY (EGD) WITH PROPOFOL;  Surgeon: Yetta Flock, MD;  Location: [facility];  Service: Gastroenterology;  Laterality: N/A;   HEMOSTASIS CLIP PLACEMENT  08/14/2022   Procedure: HEMOSTASIS CLIP PLACEMENT;  Surgeon: Yetta Flock, MD;  Location: [facility];  Service: Gastroenterology;;   HOT HEMOSTASIS N/A 08/14/2022   Procedure: HOT HEMOSTASIS (ARGON PLASMA COAGULATION/BICAP);  Surgeon: Yetta Flock, MD;  Location[facility];  Service: Gastroenterology;  Laterality: N/A;   INGUINAL HERNIA REPAIR  02/1999   Dr. Reece Agar   POLYPECTOMY  05/05/2021   Procedure: POLYPECTOMY;  Surgeon: Thornton Park, MD;  Location: [facility];  Service: Gastroenterology;;   PROSTATECTOMY  02/1999   Social History   Tobacco Use   Smoking status: Former    Types: Cigarettes    Quit date: 08/26/2001    Years since quitting: 21.0   Smokeless tobacco: Former    Types: Chew   Tobacco  comments:    quit about 10 years or more  Vaping Use   Vaping Use: Never used  Substance Use Topics   Alcohol use: Yes   Drug use: No   Family History  Problem Relation Age of Onset   Bone cancer Sister    Stomach cancer Brother    Stomach cancer Brother    Lung cancer Sister    Colon cancer Sister    Stroke Mother    Colon cancer Other        nephew   Allergies  Allergen Reactions   Lisinopril     cough   Nsaids     Elevated Cr   Current Outpatient Medications on File Prior to Visit  Medication Sig Dispense Refill   acetaminophen (TYLENOL) 325 MG tablet Take 2 tablets (650 mg total) by mouth every 6 (six) hours as needed.     atorvastatin (LIPITOR) 10 MG tablet Take 1 tablet (10 mg total) by mouth daily.     ferrous sulfate 325 (65 FE) MG tablet Take  1 tablet (325 mg total) by mouth daily with breakfast.     furosemide (LASIX) 40 MG tablet Take 1 tablet (40 mg total) by mouth daily. 30 tablet 2   isosorbide-hydrALAZINE (BIDIL) 20-37.5 MG tablet Take 1 tablet by mouth 2 (two) times daily. 60 tablet 2   latanoprost (XALATAN) 0.005 % ophthalmic solution Place 1 drop into both eyes at bedtime.     levothyroxine (SYNTHROID) 50 MCG tablet Take 1 tablet (50 mcg total) by mouth daily. Take on empty stomach early in the morning. 90 tablet 3   metoprolol succinate (TOPROL-XL) 25 MG 24 hr tablet Take 0.5 tablets (12.5 mg total) by mouth daily. 15 tablet 0   pantoprazole (PROTONIX) 40 MG tablet Take 1 tablet (40 mg total) by mouth 2 (two) times daily before a meal. 180 tablet 0   tamsulosin (FLOMAX) 0.4 MG CAPS capsule Take 0.4 mg by mouth daily.     timolol (BETIMOL) 0.5 % ophthalmic solution Place 1 drop into both eyes 2 (two) times daily.     timolol (TIMOPTIC) 0.5 % ophthalmic solution 1 drop every morning.     No current facility-administered medications on file prior to visit.      Review of Systems  Constitutional:  Positive for activity change, appetite change and fatigue.   Respiratory:  Negative for cough and chest tightness.   Gastrointestinal:  Positive for abdominal pain, diarrhea, nausea and vomiting. Negative for abdominal distention, constipation and rectal pain.  Genitourinary:  Negative for dysuria, flank pain and frequency.  Neurological:  Negative for dizziness, light-headedness and headaches.       Objective:   Physical Exam Constitutional:      General: He is not in acute distress.    Appearance: Normal appearance. He is well-developed and normal weight. He is not ill-appearing or diaphoretic.     Comments: Frail appearing   HENT:     Head: Normocephalic and atraumatic.     Mouth/Throat:     Mouth: Mucous membranes are moist.  Eyes:     General: No scleral icterus.    Conjunctiva/sclera: Conjunctivae normal.     Pupils: Pupils are equal, round, and reactive to light.  Cardiovascular:     Rate and Rhythm: Normal rate and regular rhythm.     Heart sounds: Normal heart sounds.  Pulmonary:     Effort: Pulmonary effort is normal. No respiratory distress.     Breath sounds: Normal breath sounds. No wheezing or rales.  Abdominal:     General: Abdomen is flat. Bowel sounds are normal. There is no distension.     Palpations: Abdomen is soft. There is no fluid wave, hepatomegaly, splenomegaly, mass or pulsatile mass.     Tenderness: There is abdominal tenderness in the epigastric area. There is no right CVA tenderness, left CVA tenderness, guarding or rebound. Negative signs include Murphy's sign and McBurney's sign.     Comments: Very mild epigastric tenderness  Musculoskeletal:     Cervical back: Normal range of motion and neck supple.  Lymphadenopathy:     Cervical: No cervical adenopathy.  Skin:    General: Skin is warm and dry.     Coloration: Skin is not jaundiced or pale.     Findings: No bruising or erythema.  Neurological:     Mental Status: He is alert.  Psychiatric:        Mood and Affect: Mood normal.            Assessment & Plan:  Problem List Items Addressed This Visit       Cardiovascular and Mediastinum   Gastric AVM    Noted during last hospitalization  Reviewed hospital records, lab results and studies in detail   Notes some dark stools but this may be due to oral iron  Also loose stool Cbc/iron pending  Will alert pcp        Digestive   Gastritis and gastroduodenitis    Recent hospitalization with EGD Now on iron and bid protonix 40 mg  Reviewed hospital records, lab results and studies in detail    Labs today  More dark stool and loose stool now  Reassuring exam       Upper GI bleed    Reviewed recent hosp with EGD/ labs and GI notes  Now on protoinx 40 mg bid  Also iron daily  Will check with pcp re: plan to see GI?  Cbc and iron levels today  Now having some mucousy loose stool  C diff and stool culture ordered      Relevant Orders   Comprehensive metabolic panel   CBC with Differential/Platelet   Iron     Other   Anemia due to stage 4 chronic kidney disease (HCC)           Relevant Orders   CBC with Differential/Platelet   Iron   Anemia, iron deficiency    In addn to anemia of chronic dz From recent GI bleed  Due to ckd Also recent GI bleed  Reviewed record and EGD Last Hb 8.7 (5.8 prior)  Taking ferrous sulfate 325 mg daiily  Lab today  Will update pcp       Diarrhea - Primary    New since hosp for GI bleed Reviewed hospital records, lab results and studies in detail   Watery stool with mucous  Dark color/may also be due to oral iron  Lab today  C diff test and stool cx today  Reassuring exam Does not seem to be dehydrated        Relevant Orders   C. difficile GDH and Toxin A/B   Gastrointestinal Pathogen Pnl RT, PCR

## 2022-09-01 NOTE — Telephone Encounter (Signed)
I thank all involved. 

## 2022-09-01 NOTE — Patient Instructions (Signed)
I ordered some labs and some stool tests   If symptoms suddenly get worse let us know (if severe go to the ER)  Watch for fever/ abdominal pain / vomiting or worse diarrhea   Continue your current medicines including iron and protonix

## 2022-09-01 NOTE — Telephone Encounter (Signed)
[facility] - Client TELEPHONE ADVICE RECORD [facility] Patient Name: Rick [name] Gender: Male DOB: Nov 30, 1928 Age: 89 Y 74 M 17 D Return Phone Number: [phone] (Primary) Address: City/ State/ Zip: [place] [address] Client [facility] - Client Client Site [facility] Provider Elsie Stain "Brigitte Pulse MD Contact Type Call Who Is Calling Patient / Member / Family / Caregiver Call Type Triage / Clinical Caller Name Akihiro Cogburn Relationship To Patient Spouse Return Phone Number [phone] (Primary) Chief Complaint Blood In Stool Reason for Call Symptomatic / Request for [place] states her husband s/s diarrhea and really dark stool. Translation No Nurse Assessment Nurse: Lovena Le, RN, Santiago Glad Date/Time Eilene Ghazi Time): 08/29/2022 5:03:37 PM Confirm and document reason for call. If symptomatic, describe symptoms. ---Wife states he started having diarrhea yesterday and today x3 and are black and tarry. He was having cramps prior to having a diarrhea. No vomiting or fever. Does the patient have any new or worsening symptoms? ---Yes Will a triage be completed? ---Yes Related visit to physician within the last 2 weeks? ---No Does the PT have any chronic conditions? (i.e. diabetes, asthma, this includes High risk factors for pregnancy, etc.) ---Yes List chronic conditions. ---kidney failure; htn, heart disease Is this a behavioral health or substance abuse call? ---No Guidelines Guideline Title Affirmed Question Affirmed Notes Nurse Date/Time (Eastern Time) Diarrhea Black or tarry bowel movements (Exception: Chronicunchanged black-grey BMs AND is taking iron pills or PeptoBismol.) Lovena Le, RN, Santiago Glad 08/29/2022 5:06:46 PM PLEASE NOTE: All timestamps contained within this report are represented as [place] Standard Time. CONFIDENTIALTY NOTICE: This  fax transmission is intended only for the addressee. It contains information that is legally privileged, confidential or otherwise protected from use or disclosure. If you are not the intended recipient, you are strictly prohibited from reviewing, disclosing, copying using or disseminating any of this information or taking any action in reliance on or regarding this information. If you have received this fax in error, please notify us immediately by telephone so that we can arrange for its return to [place]. Phone: [phone], Toll-Free: [phone], Fax: [phone] Page: 2 of 2 Call Id: [id number] [place]. Time Eilene Ghazi Time) Disposition Final User 08/29/2022 4:36:15 PM Attempt made - no message left Carmon, RN, Langley Gauss 08/29/2022 4:44:38 PM Attempt made - no message left Thad Ranger, RNLangley Gauss 08/29/2022 4:57:48 PM Send To RN Personal Thad Ranger, RN, Langley Gauss 08/29/2022 5:08:51 PM Go to ED Now Yes Lovena Le, RN, Santiago Glad Final Disposition 08/29/2022 5:08:51 PM Go to ED Now Yes Lovena Le, RN, [place] Pellant Disagree/Comply Comply Caller Understands Yes PreDisposition Did not know what to do Care Advice Given Per Guideline GO TO ED NOW: CARE ADVICE given per Diarrhea (Adult) guideline. * Bring a list of your current medicines when you go to the Emergency Department (ER). Comments User: Romeo Apple, RN Date/Time Eilene Ghazi Time): 08/29/2022 4:35:58 PM No vmb set up User: Moishe Spice, RN Date/Time Eilene Ghazi Time): 08/29/2022 5:08:37 PM He was in ED last week for same symptoms and stool tested + for blood. Referrals [facility]

## 2022-09-01 NOTE — Assessment & Plan Note (Addendum)
New since hosp for GI bleed Reviewed hospital records, lab results and studies in detail   Watery stool with mucous  Dark color/may also be due to oral iron  Lab today  C diff test and stool cx today  Reassuring exam Does not seem to be dehydrated

## 2022-09-01 NOTE — Assessment & Plan Note (Signed)
In addn to anemia of chronic dz From recent GI bleed  Due to ckd Also recent GI bleed  Reviewed record and EGD Last Hb 8.7 (5.8 prior)  Taking ferrous sulfate 325 mg daiily  Lab today  Will update pcp

## 2022-09-01 NOTE — Assessment & Plan Note (Signed)
Recent hospitalization with EGD Now on iron and bid protonix 40 mg  Reviewed hospital records, lab results and studies in detail    Labs today  More dark stool and loose stool now  Reassuring exam

## 2022-09-02 ENCOUNTER — Encounter (HOSPITAL_COMMUNITY): Payer: Self-pay

## 2022-09-02 ENCOUNTER — Emergency Department (HOSPITAL_COMMUNITY): Payer: Medicare PPO

## 2022-09-02 ENCOUNTER — Telehealth: Payer: Self-pay

## 2022-09-02 ENCOUNTER — Inpatient Hospital Stay (HOSPITAL_COMMUNITY)
Admission: EM | Admit: 2022-09-02 | Discharge: 2022-09-08 | DRG: 641 | Disposition: A | Discharge status: Home Health | Payer: Medicare PPO | Attending: Student | Admitting: Student

## 2022-09-02 ENCOUNTER — Other Ambulatory Visit: Payer: Self-pay

## 2022-09-02 DIAGNOSIS — Z801 Family history of malignant neoplasm of trachea, bronchus and lung: Secondary | ICD-10-CM

## 2022-09-02 DIAGNOSIS — A0472 Enterocolitis due to Clostridium difficile, not specified as recurrent: Secondary | ICD-10-CM | POA: Diagnosis present

## 2022-09-02 DIAGNOSIS — R197 Diarrhea, unspecified: Secondary | ICD-10-CM

## 2022-09-02 DIAGNOSIS — E162 Hypoglycemia, unspecified: Secondary | ICD-10-CM | POA: Diagnosis present

## 2022-09-02 DIAGNOSIS — N400 Enlarged prostate without lower urinary tract symptoms: Secondary | ICD-10-CM | POA: Diagnosis present

## 2022-09-02 DIAGNOSIS — Q613 Polycystic kidney, unspecified: Secondary | ICD-10-CM

## 2022-09-02 DIAGNOSIS — I5043 Acute on chronic combined systolic (congestive) and diastolic (congestive) heart failure: Secondary | ICD-10-CM | POA: Diagnosis not present

## 2022-09-02 DIAGNOSIS — I5020 Unspecified systolic (congestive) heart failure: Secondary | ICD-10-CM

## 2022-09-02 DIAGNOSIS — Z7189 Other specified counseling: Secondary | ICD-10-CM

## 2022-09-02 DIAGNOSIS — N184 Chronic kidney disease, stage 4 (severe): Secondary | ICD-10-CM | POA: Diagnosis present

## 2022-09-02 DIAGNOSIS — Z8546 Personal history of malignant neoplasm of prostate: Secondary | ICD-10-CM | POA: Diagnosis not present

## 2022-09-02 DIAGNOSIS — Z6820 Body mass index (BMI) 20.0-20.9, adult: Secondary | ICD-10-CM | POA: Diagnosis not present

## 2022-09-02 DIAGNOSIS — E7800 Pure hypercholesterolemia, unspecified: Secondary | ICD-10-CM | POA: Diagnosis present

## 2022-09-02 DIAGNOSIS — Z79890 Hormone replacement therapy (postmenopausal): Secondary | ICD-10-CM | POA: Diagnosis not present

## 2022-09-02 DIAGNOSIS — Z87891 Personal history of nicotine dependence: Secondary | ICD-10-CM | POA: Diagnosis not present

## 2022-09-02 DIAGNOSIS — H409 Unspecified glaucoma: Secondary | ICD-10-CM | POA: Diagnosis present

## 2022-09-02 DIAGNOSIS — Z86010 Personal history of colonic polyps: Secondary | ICD-10-CM

## 2022-09-02 DIAGNOSIS — I10 Essential (primary) hypertension: Secondary | ICD-10-CM | POA: Diagnosis present

## 2022-09-02 DIAGNOSIS — E039 Hypothyroidism, unspecified: Secondary | ICD-10-CM | POA: Diagnosis present

## 2022-09-02 DIAGNOSIS — I5042 Chronic combined systolic (congestive) and diastolic (congestive) heart failure: Secondary | ICD-10-CM | POA: Diagnosis present

## 2022-09-02 DIAGNOSIS — I959 Hypotension, unspecified: Secondary | ICD-10-CM | POA: Diagnosis not present

## 2022-09-02 DIAGNOSIS — E871 Hypo-osmolality and hyponatremia: Secondary | ICD-10-CM | POA: Diagnosis present

## 2022-09-02 DIAGNOSIS — I132 Hypertensive heart and chronic kidney disease with heart failure and with stage 5 chronic kidney disease, or end stage renal disease: Secondary | ICD-10-CM | POA: Diagnosis present

## 2022-09-02 DIAGNOSIS — Z800 Family history of malignant neoplasm of digestive organs: Secondary | ICD-10-CM

## 2022-09-02 DIAGNOSIS — R0602 Shortness of breath: Secondary | ICD-10-CM | POA: Diagnosis not present

## 2022-09-02 DIAGNOSIS — E8809 Other disorders of plasma-protein metabolism, not elsewhere classified: Secondary | ICD-10-CM | POA: Diagnosis present

## 2022-09-02 DIAGNOSIS — Z634 Disappearance and death of family member: Secondary | ICD-10-CM | POA: Diagnosis not present

## 2022-09-02 DIAGNOSIS — Z66 Do not resuscitate: Secondary | ICD-10-CM | POA: Diagnosis not present

## 2022-09-02 DIAGNOSIS — D631 Anemia in chronic kidney disease: Secondary | ICD-10-CM | POA: Diagnosis present

## 2022-09-02 DIAGNOSIS — N189 Chronic kidney disease, unspecified: Secondary | ICD-10-CM | POA: Diagnosis present

## 2022-09-02 DIAGNOSIS — Z823 Family history of stroke: Secondary | ICD-10-CM

## 2022-09-02 DIAGNOSIS — K219 Gastro-esophageal reflux disease without esophagitis: Secondary | ICD-10-CM | POA: Diagnosis present

## 2022-09-02 DIAGNOSIS — N185 Chronic kidney disease, stage 5: Secondary | ICD-10-CM | POA: Diagnosis present

## 2022-09-02 DIAGNOSIS — R109 Unspecified abdominal pain: Secondary | ICD-10-CM | POA: Diagnosis present

## 2022-09-02 DIAGNOSIS — R54 Age-related physical debility: Secondary | ICD-10-CM | POA: Diagnosis present

## 2022-09-02 DIAGNOSIS — A498 Other bacterial infections of unspecified site: Secondary | ICD-10-CM | POA: Insufficient documentation

## 2022-09-02 DIAGNOSIS — N179 Acute kidney failure, unspecified: Secondary | ICD-10-CM | POA: Diagnosis present

## 2022-09-02 DIAGNOSIS — R9431 Abnormal electrocardiogram [ECG] [EKG]: Secondary | ICD-10-CM | POA: Diagnosis not present

## 2022-09-02 DIAGNOSIS — Z515 Encounter for palliative care: Secondary | ICD-10-CM

## 2022-09-02 DIAGNOSIS — E872 Acidosis, unspecified: Secondary | ICD-10-CM | POA: Diagnosis present

## 2022-09-02 DIAGNOSIS — R680 Hypothermia, not associated with low environmental temperature: Secondary | ICD-10-CM | POA: Diagnosis present

## 2022-09-02 DIAGNOSIS — Z886 Allergy status to analgesic agent status: Secondary | ICD-10-CM

## 2022-09-02 DIAGNOSIS — Z888 Allergy status to other drugs, medicaments and biological substances status: Secondary | ICD-10-CM

## 2022-09-02 DIAGNOSIS — Z79899 Other long term (current) drug therapy: Secondary | ICD-10-CM

## 2022-09-02 HISTORY — DX: Heart failure, unspecified: I50.9

## 2022-09-02 LAB — URINALYSIS, ROUTINE W REFLEX MICROSCOPIC
Bilirubin Urine: NEGATIVE
Glucose, UA: NEGATIVE mg/dL
Ketones, ur: NEGATIVE mg/dL
Nitrite: NEGATIVE
Protein, ur: 30 mg/dL — AB
Specific Gravity, Urine: 1.011 (ref 1.005–1.030)
pH: 5 (ref 5.0–8.0)

## 2022-09-02 LAB — CBC WITH DIFFERENTIAL/PLATELET
Abs Immature Granulocytes: 0.02 10*3/uL (ref 0.00–0.07)
Basophils Absolute: 0.1 10*3/uL (ref 0.0–0.1)
Basophils Absolute: 0 10*3/uL (ref 0.0–0.1)
Basophils Relative: 1.1 % (ref 0.0–3.0)
Basophils Relative: 0 %
Eosinophils Absolute: 0.1 10*3/uL (ref 0.0–0.7)
Eosinophils Absolute: 0.1 10*3/uL (ref 0.0–0.5)
Eosinophils Relative: 0.6 % (ref 0.0–5.0)
Eosinophils Relative: 1 %
HCT: 27.5 % — ABNORMAL LOW (ref 39.0–52.0)
HCT: 28.3 % — ABNORMAL LOW (ref 39.0–52.0)
Hemoglobin: 9.3 g/dL — ABNORMAL LOW (ref 13.0–17.0)
Hemoglobin: 9.2 g/dL — ABNORMAL LOW (ref 13.0–17.0)
Immature Granulocytes: 0 %
Lymphocytes Relative: 3.6 % — ABNORMAL LOW (ref 12.0–46.0)
Lymphocytes Relative: 4 %
Lymphs Abs: 0.3 10*3/uL — ABNORMAL LOW (ref 0.7–4.0)
Lymphs Abs: 0.3 10*3/uL — ABNORMAL LOW (ref 0.7–4.0)
MCH: 28.4 pg (ref 26.0–34.0)
MCHC: 33.9 g/dL (ref 30.0–36.0)
MCHC: 32.5 g/dL (ref 30.0–36.0)
MCV: 85.7 fl (ref 78.0–100.0)
MCV: 87.3 fL (ref 80.0–100.0)
Monocytes Absolute: 0.3 10*3/uL (ref 0.1–1.0)
Monocytes Absolute: 0.3 10*3/uL (ref 0.1–1.0)
Monocytes Relative: 3.1 % (ref 3.0–12.0)
Monocytes Relative: 3 %
Neutro Abs: 8.7 10*3/uL — ABNORMAL HIGH (ref 1.4–7.7)
Neutro Abs: 8 10*3/uL — ABNORMAL HIGH (ref 1.7–7.7)
Neutrophils Relative %: 91.6 % — ABNORMAL HIGH (ref 43.0–77.0)
Neutrophils Relative %: 92 %
Platelets: 184 10*3/uL (ref 150.0–400.0)
Platelets: 178 10*3/uL (ref 150–400)
RBC: 3.21 Mil/uL — ABNORMAL LOW (ref 4.22–5.81)
RBC: 3.24 MIL/uL — ABNORMAL LOW (ref 4.22–5.81)
RDW: 17 % — ABNORMAL HIGH (ref 11.5–15.5)
RDW: 16.4 % — ABNORMAL HIGH (ref 11.5–15.5)
WBC: 9.5 10*3/uL (ref 4.0–10.5)
WBC: 8.7 10*3/uL (ref 4.0–10.5)
nRBC: 0 % (ref 0.0–0.2)

## 2022-09-02 LAB — BASIC METABOLIC PANEL
Anion gap: 13 (ref 5–15)
Anion gap: 14 (ref 5–15)
BUN: 60 mg/dL — ABNORMAL HIGH (ref 8–23)
BUN: 61 mg/dL — ABNORMAL HIGH (ref 8–23)
CO2: 19 mmol/L — ABNORMAL LOW (ref 22–32)
CO2: 17 mmol/L — ABNORMAL LOW (ref 22–32)
Calcium: 7.8 mg/dL — ABNORMAL LOW (ref 8.9–10.3)
Calcium: 7.6 mg/dL — ABNORMAL LOW (ref 8.9–10.3)
Chloride: 91 mmol/L — ABNORMAL LOW (ref 98–111)
Chloride: 92 mmol/L — ABNORMAL LOW (ref 98–111)
Creatinine, Ser: 6.46 mg/dL — ABNORMAL HIGH (ref 0.61–1.24)
Creatinine, Ser: 6.29 mg/dL — ABNORMAL HIGH (ref 0.61–1.24)
GFR, Estimated: 8 mL/min — ABNORMAL LOW (ref >60)
GFR, Estimated: 8 mL/min — ABNORMAL LOW (ref >60)
Glucose, Bld: 56 mg/dL — ABNORMAL LOW (ref 70–99)
Glucose, Bld: 55 mg/dL — ABNORMAL LOW (ref 70–99)
Potassium: 3.9 mmol/L (ref 3.5–5.1)
Potassium: 3.5 mmol/L (ref 3.5–5.1)
Sodium: 123 mmol/L — ABNORMAL LOW (ref 135–145)
Sodium: 123 mmol/L — ABNORMAL LOW (ref 135–145)

## 2022-09-02 LAB — COMPREHENSIVE METABOLIC PANEL
ALT: 10 U/L (ref 0–53)
ALT: 12 U/L (ref 0–44)
AST: 33 U/L (ref 0–37)
AST: 30 U/L (ref 15–41)
Albumin: 2.4 g/dL — ABNORMAL LOW (ref 3.5–5.2)
Albumin: 1.8 g/dL — ABNORMAL LOW (ref 3.5–5.0)
Alkaline Phosphatase: 409 U/L — ABNORMAL HIGH (ref 39–117)
Alkaline Phosphatase: 363 U/L — ABNORMAL HIGH (ref 38–126)
Anion gap: 11 (ref 5–15)
BUN: 57 mg/dL — ABNORMAL HIGH (ref 6–23)
BUN: 60 mg/dL — ABNORMAL HIGH (ref 8–23)
CO2: 17 mEq/L — ABNORMAL LOW (ref 19–32)
CO2: 19 mmol/L — ABNORMAL LOW (ref 22–32)
Calcium: 7.8 mg/dL — ABNORMAL LOW (ref 8.4–10.5)
Calcium: 7.5 mg/dL — ABNORMAL LOW (ref 8.9–10.3)
Chloride: 91 mEq/L — ABNORMAL LOW (ref 96–112)
Chloride: 91 mmol/L — ABNORMAL LOW (ref 98–111)
Creatinine, Ser: 6.11 mg/dL (ref 0.40–1.50)
Creatinine, Ser: 6.42 mg/dL — ABNORMAL HIGH (ref 0.61–1.24)
GFR, Estimated: 8 mL/min — ABNORMAL LOW (ref >60)
GFR: 7.5 mL/min — CL (ref >60.00)
Glucose, Bld: 94 mg/dL (ref 70–99)
Glucose, Bld: 73 mg/dL (ref 70–99)
Potassium: 3.4 mEq/L — ABNORMAL LOW (ref 3.5–5.1)
Potassium: 3.2 mmol/L — ABNORMAL LOW (ref 3.5–5.1)
Sodium: 119 mEq/L — CL (ref 135–145)
Sodium: 121 mmol/L — ABNORMAL LOW (ref 135–145)
Total Bilirubin: 0.8 mg/dL (ref 0.2–1.2)
Total Bilirubin: 1 mg/dL (ref 0.3–1.2)
Total Protein: 7.1 g/dL (ref 6.0–8.3)
Total Protein: 6.4 g/dL — ABNORMAL LOW (ref 6.5–8.1)

## 2022-09-02 LAB — MAGNESIUM: Magnesium: 1.9 mg/dL (ref 1.7–2.4)

## 2022-09-02 LAB — NA AND K (SODIUM & POTASSIUM), RAND UR
Potassium Urine: 28 mmol/L
Sodium, Ur: 43 mmol/L

## 2022-09-02 LAB — IRON: Iron: 30 ug/dL — ABNORMAL LOW (ref 42–165)

## 2022-09-02 LAB — OSMOLALITY: Osmolality: 273 mOsm/kg — ABNORMAL LOW (ref 275–295)

## 2022-09-02 LAB — TROPONIN I (HIGH SENSITIVITY)
Troponin I (High Sensitivity): 54 ng/L — ABNORMAL HIGH (ref <18)
Troponin I (High Sensitivity): 45 ng/L — ABNORMAL HIGH (ref <18)

## 2022-09-02 LAB — BRAIN NATRIURETIC PEPTIDE: B Natriuretic Peptide: 622.3 pg/mL — ABNORMAL HIGH (ref 0.0–100.0)

## 2022-09-02 LAB — OSMOLALITY, URINE: Osmolality, Ur: 318 mOsm/kg (ref 300–900)

## 2022-09-02 LAB — TSH: TSH: 27.205 u[IU]/mL — ABNORMAL HIGH (ref 0.350–4.500)

## 2022-09-02 MED ORDER — SODIUM CHLORIDE 0.9% FLUSH
3.0000 mL | Freq: Two times a day (BID) | INTRAVENOUS | Status: DC
  Administered 2022-09-02 – 2022-09-08 (×11): 3 mL via INTRAVENOUS

## 2022-09-02 MED ORDER — POLYETHYLENE GLYCOL 3350 17 G PO PACK: 17.0000 g | PACK | Freq: Every day | ORAL | Status: DC | PRN

## 2022-09-02 MED ORDER — ACETAMINOPHEN 325 MG PO TABS
650.0000 mg | ORAL_TABLET | Freq: Four times a day (QID) | ORAL | Status: DC | PRN
  Administered 2022-09-03: 650 mg via ORAL
  Filled 2022-09-02: qty 2

## 2022-09-02 MED ORDER — LEVOTHYROXINE SODIUM 50 MCG PO TABS
50.0000 ug | ORAL_TABLET | Freq: Every day | ORAL | Status: DC
  Administered 2022-09-03: 50 ug via ORAL
  Filled 2022-09-02: qty 1

## 2022-09-02 MED ORDER — TIMOLOL MALEATE 0.5 % OP SOLN
1.0000 [drp] | Freq: Every morning | OPHTHALMIC | Status: DC
  Administered 2022-09-03 – 2022-09-08 (×6): 1 [drp] via OPHTHALMIC
  Filled 2022-09-02: qty 5

## 2022-09-02 MED ORDER — METOPROLOL SUCCINATE ER 25 MG PO TB24
12.5000 mg | ORAL_TABLET | Freq: Every day | ORAL | Status: DC
  Administered 2022-09-03 – 2022-09-08 (×6): 12.5 mg via ORAL
  Filled 2022-09-02 (×6): qty 1

## 2022-09-02 MED ORDER — HEPARIN SODIUM (PORCINE) 5000 UNIT/ML IJ SOLN
5000.0000 [IU] | Freq: Three times a day (TID) | INTRAMUSCULAR | Status: DC
  Administered 2022-09-02 – 2022-09-08 (×17): 5000 [IU] via SUBCUTANEOUS
  Filled 2022-09-02 (×17): qty 1

## 2022-09-02 MED ORDER — ATORVASTATIN CALCIUM 10 MG PO TABS
10.0000 mg | ORAL_TABLET | Freq: Every day | ORAL | Status: DC
  Administered 2022-09-03 – 2022-09-08 (×6): 10 mg via ORAL
  Filled 2022-09-02 (×6): qty 1

## 2022-09-02 MED ORDER — ACETAMINOPHEN 650 MG RE SUPP: 650.0000 mg | Freq: Four times a day (QID) | RECTAL | Status: DC | PRN

## 2022-09-02 MED ORDER — FUROSEMIDE 10 MG/ML IJ SOLN
40.0000 mg | Freq: Once | INTRAMUSCULAR | Status: AC
  Administered 2022-09-02: 40 mg via INTRAVENOUS
  Filled 2022-09-02: qty 4

## 2022-09-02 MED ORDER — TAMSULOSIN HCL 0.4 MG PO CAPS
0.4000 mg | ORAL_CAPSULE | Freq: Every day | ORAL | Status: DC
  Administered 2022-09-03 – 2022-09-08 (×6): 0.4 mg via ORAL
  Filled 2022-09-02 (×6): qty 1

## 2022-09-02 MED ORDER — POTASSIUM CHLORIDE CRYS ER 20 MEQ PO TBCR
20.0000 meq | EXTENDED_RELEASE_TABLET | Freq: Once | ORAL | Status: AC
  Administered 2022-09-02: 20 meq via ORAL
  Filled 2022-09-02: qty 1

## 2022-09-02 MED ORDER — ISOSORB DINITRATE-HYDRALAZINE 20-37.5 MG PO TABS
1.0000 | ORAL_TABLET | Freq: Two times a day (BID) | ORAL | Status: DC
  Administered 2022-09-02: 1 via ORAL
  Filled 2022-09-02: qty 1

## 2022-09-02 MED ORDER — PANTOPRAZOLE SODIUM 40 MG PO TBEC
40.0000 mg | DELAYED_RELEASE_TABLET | Freq: Two times a day (BID) | ORAL | Status: DC
  Administered 2022-09-02 – 2022-09-08 (×12): 40 mg via ORAL
  Filled 2022-09-02 (×12): qty 1

## 2022-09-02 NOTE — ED Provider Notes (Signed)
[facility] Provider Note   CSN: [id number] Arrival date & time: 09/02/22  1227     History  Chief Complaint  Patient presents with   hyponatremia    Rick [name] is a 89 y.o. male with a past medical history significant for hypertension, hyperlipidemia, systolic CHF, GERD, CKD, prostate cancer, gastric polyps, treated hyponatremia with 2 recent admissions who presents to the ED due to hyponatremia.  Patient was seen by PCP yesterday due to nonbloody diarrhea for the past week.  He notes diarrhea has slightly improved.  He admits to 2-3 episodes of nonbloody diarrhea for the past week.  Any recent antibiotics.  Patient notes he was having some abdominal cramping yesterday which has completely resolved.  Patient notes his stool is more formed today.  Admits to 1 episode of nonbloody, nonbilious emesis yesterday after eating a cracker.  No fever or chills.  No sick contacts.  No recent travel.  Denies ingestion of undercooked foods. No seizures.   Chart reviewed.  Patient recently admitted to the hospital on 3/6 to 3/9 due to GI bleed.  At that time patient had an endoscopy on 3/7 where he was found to have a bleeding lesion in the gastric body which was treated with APC.  Patient denies any current bleeding.  During his admission patient was also found to be hyponatremic which was thought to be related to his CHF.  Sodium during his last admission was 130 upon arrival to the ED. His family at bedside notes he has had issues with his sodium prior to that admission.   History obtained from patient and past medical records. No interpreter used during encounter.       Home Medications Prior to Admission medications   Medication Sig Start Date End Date Taking? Authorizing Provider  acetaminophen (TYLENOL) 325 MG tablet Take 2 tablets (650 mg total) by mouth every 6 (six) hours as needed. 11/01/21   Jill Alexanders, PA-C  atorvastatin (LIPITOR)  10 MG tablet Take 1 tablet (10 mg total) by mouth daily. 08/06/22   Tonia Ghent, MD  ferrous sulfate 325 (65 FE) MG tablet Take 1 tablet (325 mg total) by mouth daily with breakfast. 11/21/21 11/21/22  Tonia Ghent, MD  furosemide (LASIX) 40 MG tablet Take 1 tablet (40 mg total) by mouth daily. 06/10/22 09/08/22  Mariel Aloe, MD  isosorbide-hydrALAZINE (BIDIL) 20-37.5 MG tablet Take 1 tablet by mouth 2 (two) times daily. 06/09/22 09/07/22  Mariel Aloe, MD  latanoprost (XALATAN) 0.005 % ophthalmic solution Place 1 drop into both eyes at bedtime.    [provider]  levothyroxine (SYNTHROID) 50 MCG tablet Take 1 tablet (50 mcg total) by mouth daily. Take on empty stomach early in the morning. 12/29/21   Tonia Ghent, MD  metoprolol succinate (TOPROL-XL) 25 MG 24 hr tablet Take 0.5 tablets (12.5 mg total) by mouth daily. 08/05/22 09/04/22  Arrien, Jimmy Picket, MD  pantoprazole (PROTONIX) 40 MG tablet Take 1 tablet (40 mg total) by mouth 2 (two) times daily before a meal. 08/16/22 11/14/22  Dahal, Marlowe Aschoff, MD  tamsulosin (FLOMAX) 0.4 MG CAPS capsule Take 0.4 mg by mouth daily. 05/30/22   [provider]  timolol (BETIMOL) 0.5 % ophthalmic solution Place 1 drop into both eyes 2 (two) times daily.    [provider]  timolol (TIMOPTIC) 0.5 % ophthalmic solution 1 drop every morning. 08/29/22   [provider]  Allergies    Lisinopril and Nsaids    Review of Systems   Review of Systems  Constitutional:  Negative for fever.  Respiratory:  Positive for shortness of breath (chronic).   Cardiovascular:  Negative for chest pain.  Gastrointestinal:  Positive for abdominal pain (resolved), diarrhea and vomiting.    Physical Exam Updated Vital Signs BP 125/70   Pulse 74   Temp (!) 97.4 F (36.3 C) (Oral)   Resp 18   SpO2 100%  Physical Exam Vitals and nursing note reviewed.  Constitutional:      General: He is not in acute distress.    Appearance: He  is not ill-appearing.  HENT:     Head: Normocephalic.  Eyes:     Pupils: Pupils are equal, round, and reactive to light.  Cardiovascular:     Rate and Rhythm: Normal rate and regular rhythm.     Pulses: Normal pulses.     Heart sounds: Normal heart sounds. No murmur heard.    No friction rub. No gallop.  Pulmonary:     Effort: Pulmonary effort is normal.     Breath sounds: Normal breath sounds.  Abdominal:     General: Abdomen is flat. There is distension.     Palpations: Abdomen is soft.     Tenderness: There is no abdominal tenderness. There is no guarding or rebound.     Comments: Nontender abdomen  Musculoskeletal:        General: Normal range of motion.     Cervical back: Neck supple.     Comments: 2+ pitting edema bilaterally  Skin:    General: Skin is warm and dry.  Neurological:     General: No focal deficit present.     Mental Status: He is alert.  Psychiatric:        Mood and Affect: Mood normal.        Behavior: Behavior normal.     ED Results / Procedures / Treatments   Labs (all labs ordered are listed, but only abnormal results are displayed) Labs Reviewed  CBC WITH DIFFERENTIAL/PLATELET - Abnormal; Notable for the following components:      Result Value   RBC 3.24 (*)    Hemoglobin 9.2 (*)    HCT 28.3 (*)    RDW 16.4 (*)    Neutro Abs 8.0 (*)    Lymphs Abs 0.3 (*)    All other components within normal limits  COMPREHENSIVE METABOLIC PANEL - Abnormal; Notable for the following components:   Sodium 121 (*)    Potassium 3.2 (*)    Chloride 91 (*)    CO2 19 (*)    BUN 60 (*)    Creatinine, Ser 6.42 (*)    Calcium 7.5 (*)    Total Protein 6.4 (*)    Albumin 1.8 (*)    Alkaline Phosphatase 363 (*)    GFR, Estimated 8 (*)    All other components within normal limits  BRAIN NATRIURETIC PEPTIDE - Abnormal; Notable for the following components:   B Natriuretic Peptide 622.3 (*)    All other components within normal limits  TROPONIN I (HIGH  SENSITIVITY) - Abnormal; Notable for the following components:   Troponin I (High Sensitivity) 54 (*)    All other components within normal limits  GASTROINTESTINAL PANEL BY PCR, STOOL (REPLACES STOOL CULTURE)  NA AND K (SODIUM & POTASSIUM), RAND UR  OSMOLALITY, URINE  URINALYSIS, ROUTINE W REFLEX MICROSCOPIC  OSMOLALITY  MAGNESIUM  BASIC METABOLIC PANEL  BASIC METABOLIC PANEL  BASIC METABOLIC PANEL  BASIC METABOLIC PANEL  BASIC METABOLIC PANEL  TROPONIN I (HIGH SENSITIVITY)    EKG EKG Interpretation  Date/Time:  Tuesday September 02 2022 12:41:46 EDT Ventricular Rate:  74 PR Interval:  267 QRS Duration: 179 QT Interval:  472 QTC Calculation: 524 R Axis:   -83 Text Interpretation: Sinus rhythm Right bundle branch block Baseline wander in lead(s) V3 Confirmed by Kommor, [place] ([id number]) on 09/02/2022 1:11:24 PM  Radiology DG Chest Portable 1 View  Result Date: 09/02/2022 CLINICAL DATA:  Shortness of breath EXAM: PORTABLE CHEST 1 VIEW COMPARISON:  07/23/2022 FINDINGS: Cardiomegaly. Unchanged diffuse bilateral interstitial opacity. The visualized skeletal structures are unremarkable. IMPRESSION: Cardiomegaly with unchanged diffuse bilateral interstitial opacity, likely edema. No new or focal airspace opacity. Electronically Signed   By: Delanna Ahmadi M.D.   On: 09/02/2022 14:10    Procedures .Critical Care  Performed by: Suzy Bouchard, PA-C Authorized by: Suzy Bouchard, PA-C   Critical care provider statement:    Critical care time (minutes):  37   Critical care was necessary to treat or prevent imminent or life-threatening deterioration of the following conditions:  Renal failure and endocrine crisis   Critical care was time spent personally by me on the following activities:  Development of treatment plan with patient or surrogate, discussions with consultants, evaluation of patient's response to treatment, examination of patient, ordering and review of laboratory  studies, ordering and review of radiographic studies, ordering and performing treatments and interventions, pulse oximetry, re-evaluation of patient's condition and review of old charts   I assumed direction of critical care for this patient from another provider in my specialty: no     Care discussed with: admitting provider       Medications Ordered in ED Medications  tamsulosin (FLOMAX) capsule 0.4 mg (has no administration in time range)  pantoprazole (PROTONIX) EC tablet 40 mg (has no administration in time range)  levothyroxine (SYNTHROID) tablet 50 mcg (has no administration in time range)  metoprolol succinate (TOPROL-XL) 24 hr tablet 12.5 mg (has no administration in time range)  isosorbide-hydrALAZINE (BIDIL) 20-37.5 MG per tablet 1 tablet (has no administration in time range)  atorvastatin (LIPITOR) tablet 10 mg (has no administration in time range)  timolol (TIMOPTIC) 0.5 % ophthalmic solution 1 drop (has no administration in time range)  heparin injection 5,000 Units (has no administration in time range)  sodium chloride flush (NS) 0.9 % injection 3 mL (has no administration in time range)  acetaminophen (TYLENOL) tablet 650 mg (has no administration in time range)    Or  acetaminophen (TYLENOL) suppository 650 mg (has no administration in time range)  polyethylene glycol (MIRALAX / GLYCOLAX) packet 17 g (has no administration in time range)  potassium chloride SA (KLOR-CON M) CR tablet 20 mEq (20 mEq Oral Given 09/02/22 1531)    ED Course/ Medical Decision Making/ A&P Clinical Course as of 09/02/22 1538  Tue Sep 02, 2022  1424 Hemoglobin(!): 9.2 [CA]  1424 Hgb appears better than patient's baseline; however still anemic.  [CA]  1453 Alkaline Phosphatase(!): 363 [CA]  1454 Sodium(!): 121 [CA]    Clinical Course User Index [CA] Suzy Bouchard, PA-C                             Medical Decision Making Amount and/or Complexity of Data Reviewed Independent  Historian: spouse External Data Reviewed: notes.    Details: PCP  note Labs: ordered. Decision-making details documented in ED Course. Radiology: ordered and independent interpretation performed. Decision-making details documented in ED Course. ECG/medicine tests: ordered and independent interpretation performed. Decision-making details documented in ED Course.  Risk Prescription drug management. Decision regarding hospitalization.   This patient presents to the ED for concern of hyponatremia, this involves an extensive number of treatment options, and is a complaint that carries with it a high risk of complications and morbidity.  The differential diagnosis includes GI loss, CKD, CHF, etc  89 year old male presents to the ED due to hyponatremia.  Patient seen by PCP yesterday due to 1 week of diarrhea.  Patient notes diarrhea has slightly improved.  He admits to 2-3 episodes of nonbloody diarrhea for the past week.  1 episode of nonbloody, nonbilious emesis yesterday after eating a cracker.  No fever.  No recent antibiotics.  Patient recently admitted for GI bleed beginning of March.  Patient notes he feels slightly weak when he stands up however, no other complaints.  Upon arrival, patient afebrile, not tachycardic or hypoxic.  Patient in no acute distress.  Physical exam significant for distended abdomen which patient notes is normal for him.  Abdomen soft and nontender, low suspicion for peritonitis.  2+ pitting edema bilaterally.  Patient endorses shortness of breath which has not changed in past week.  During patient's most recent admission patient was found to be hyponatremic which was thought to be related to CHF.  No recent seizures. Patient denies melena, hematochezia, and hematemesis.  Routine labs ordered to recheck sodium. CXR and BNP added to rule out CHF exacerbation. Patient does appear fluid overloaded on exam.   CBC significant for anemia with hemoglobin at 9.2 which appears to be  slightly better than patient's baseline.  No leukocytosis.  CMP significant for hyponatremia at 121.  Hypokalemia 3.2.  Magnesium added.  Potassium repleted here in the ED.  Elevated creatinine at 6.42 and BUN at 60 which appears slightly worse than patient's baseline.  Elevated alk phos at 363.  BNP elevated at 622.  Chest x-ray personally reviewed and interpreted which demonstrates cardiomegaly with unchanged diffuse bilateral interstitial opacities likely edema.  No focal airspace opacities.  Feel chest x-ray findings likely due to CHF exacerbation. EKG NSR. Nonischemic. Low suspicion for atypical ACS.  Reassessed patient. Patient resting comfortably in bed. No complaints.   Given significant hyponatremia and AKI patient will require admission for further treatment.  Will consult hospitalist.   Discussed with Dr. Matilde Sprang who evaluated patient at bedside and agrees with assessment and plan  3:26 PM Discussed with Dr. Trilby Drummer who agrees to admit patient for further treatment.   Has PCP Hx CKD, BPH, HTN       Final Clinical Impression(s) / ED Diagnoses Final diagnoses:  Hyponatremia  Diarrhea, unspecified type    Rx / DC Orders ED Discharge Orders     None         Rick [name] 09/02/22 1542    Kommor, [place], MD 09/02/22 1902

## 2022-09-02 NOTE — Telephone Encounter (Signed)
Noted. Thanks.

## 2022-09-02 NOTE — ED Notes (Signed)
ED TO INPATIENT HANDOFF REPORT  ED Nurse Name and Phone #: Caryl Pina RN [phone]  S Name/Age/Gender Rick [name] 89 y.o. male Room/Bed: 025C/025C  Code Status   Code Status: Full Code  Home/SNF/Other Home Patient oriented to: self, place, time, and situation Is this baseline? Yes   Triage Complete: Triage complete  Chief Complaint Hyponatremia [E87.1]  Triage Note Pt sent by PCP for sodium level of 119; endorses diarrhea x 2-3 days prior to blood work (drawn yesterday), has resolved now; family states pt has hx stage 4 kidney disease, CHF; pt has no complaints at this time   Allergies Allergies  Allergen Reactions   Lisinopril     cough   Nsaids     Elevated Cr    Level of Care/Admitting Diagnosis ED Disposition     ED Disposition  Admit   Condition  --   [place]: [place] [100100]  Level of Care: Progressive [102]  Admit to Progressive based on following criteria: NEPHROLOGY stable condition requiring close monitoring for AKI, requiring Hemodialysis or Peritoneal Dialysis either from expected electrolyte imbalance, acidosis, or fluid overload that can be managed by NIPPV or high flow oxygen.  May place patient in observation at [facility] or [place] if equivalent level of care is available:: No  Covid Evaluation: Asymptomatic - no recent exposure (last 10 days) testing not required  Diagnosis: Hyponatremia [198519]  Admitting Physician: Marcelyn Bruins [id number]  Attending Physician: Marcelyn Bruins [id number]          B Medical/Surgery History Past Medical History:  Diagnosis Date   Acute hypoxemic respiratory failure ([place]) [id number]   Acute metabolic encephalopathy [id number]   Acute on chronic heart failure with preserved ejection fraction (HFpEF) ([place]) 05/02/2021   Anemia 01/16/2015   Arthritis    CHF (congestive heart failure) ([facility])    GERD (gastroesophageal reflux disease)    History of colon  polyps    History of prostate cancer    Hyperlipidemia    Hypertension    Kidney cysts    bilateral.  Renal US   Mitral regurgitation 12/02/2020   Past Surgical History:  Procedure Laterality Date   BIOPSY  05/05/2021   Procedure: BIOPSY;  Surgeon: Thornton Park, MD;  Location: [place];  Service: Gastroenterology;;   COLONOSCOPY     CYSTOSCOPY  02/12/99   biopsy   ESOPHAGEAL BANDING N/A 08/04/2012   Procedure: ESOPHAGEAL BANDING;  Surgeon: Inda Castle, MD;  Location: [facility];  Service: Endoscopy;  Laterality: N/A;   ESOPHAGOGASTRODUODENOSCOPY N/A 08/04/2012   Procedure: ESOPHAGOGASTRODUODENOSCOPY (EGD);  Surgeon: Inda Castle, MD;  Location: [facility];  Service: Endoscopy;  Laterality: N/A;   ESOPHAGOGASTRODUODENOSCOPY (EGD) WITH PROPOFOL N/A 05/05/2021   Procedure: ESOPHAGOGASTRODUODENOSCOPY (EGD) WITH PROPOFOL;  Surgeon: Thornton Park, MD;  Location: [place];  Service: Gastroenterology;  Laterality: N/A;   ESOPHAGOGASTRODUODENOSCOPY (EGD) WITH PROPOFOL N/A 08/14/2022   Procedure: ESOPHAGOGASTRODUODENOSCOPY (EGD) WITH PROPOFOL;  Surgeon: Yetta Flock, MD;  Location: [facility];  Service: Gastroenterology;  Laterality: N/A;   HEMOSTASIS CLIP PLACEMENT  08/14/2022   Procedure: HEMOSTASIS CLIP PLACEMENT;  Surgeon: Yetta Flock, MD;  Location: [facility];  Service: Gastroenterology;;   HOT HEMOSTASIS N/A 08/14/2022   Procedure: HOT HEMOSTASIS (ARGON PLASMA COAGULATION/BICAP);  Surgeon: Yetta Flock, MD;  Location: [facility] ENDOSCOPY;  Service: Gastroenterology;  Laterality: N/A;   INGUINAL HERNIA REPAIR  02/1999   Dr. Reece Agar   POLYPECTOMY  05/05/2021   Procedure: POLYPECTOMY;  Surgeon: Thornton Park, MD;  Location: [facility] ENDOSCOPY;  Service: Gastroenterology;;   PROSTATECTOMY  02/1999     A IV Location/Drains/Wounds Patient Lines/Drains/Airways Status     Active Line/Drains/Airways     Name Placement date Placement time Site Days    Peripheral IV 09/02/22 20 G Anterior;Distal;Left;Upper Arm 09/02/22  1354  Arm  less than 1   Wound / Incision (Open or Dehisced) 07/24/22 Other (Comment) Coccyx Posterior 07/24/22  1500  Coccyx  40            Intake/Output Last 24 hours No intake or output data in the 24 hours ending 09/02/22 1541  Labs/Imaging Results for orders placed or performed during the hospital encounter of 09/02/22 (from the past 48 hour(s))  CBC with Differential     Status: Abnormal   Collection Time: 09/02/22 12:45 PM  Result Value Ref Range   WBC 8.7 4.0 - 10.5 K/uL   RBC 3.24 (L) 4.22 - 5.81 MIL/uL   Hemoglobin 9.2 (L) 13.0 - 17.0 g/dL   HCT 28.3 (L) 39.0 - 52.0 %   MCV 87.3 80.0 - 100.0 fL   MCH 28.4 26.0 - 34.0 pg   MCHC 32.5 30.0 - 36.0 g/dL   RDW 16.4 (H) 11.5 - 15.5 %   Platelets 178 150 - 400 K/uL   nRBC 0.0 0.0 - 0.2 %   Neutrophils Relative % 92 %   Neutro Abs 8.0 (H) 1.7 - 7.7 K/uL   Lymphocytes Relative 4 %   Lymphs Abs 0.3 (L) 0.7 - 4.0 K/uL   Monocytes Relative 3 %   Monocytes Absolute 0.3 0.1 - 1.0 K/uL   Eosinophils Relative 1 %   Eosinophils Absolute 0.1 0.0 - 0.5 K/uL   Basophils Relative 0 %   Basophils Absolute 0.0 0.0 - 0.1 K/uL   Immature Granulocytes 0 %   Abs Immature Granulocytes 0.02 0.00 - 0.07 K/uL    Comment: Performed at [facility], [address], [place], [place] [address]  Comprehensive metabolic panel     Status: Abnormal   Collection Time: 09/02/22 12:45 PM  Result Value Ref Range   Sodium 121 (L) 135 - 145 mmol/L   Potassium 3.2 (L) 3.5 - 5.1 mmol/L   Chloride 91 (L) 98 - 111 mmol/L   CO2 19 (L) 22 - 32 mmol/L   Glucose, Bld 73 70 - 99 mg/dL    Comment: Glucose reference range applies only to samples taken after fasting for at least 8 hours.   BUN 60 (H) 8 - 23 mg/dL   Creatinine, Ser 6.42 (H) 0.61 - 1.24 mg/dL   Calcium 7.5 (L) 8.9 - 10.3 mg/dL   Total Protein 6.4 (L) 6.5 - 8.1 g/dL   Albumin 1.8 (L) 3.5 - 5.0 g/dL   AST 30 15 - 41 U/L    ALT 12 0 - 44 U/L   Alkaline Phosphatase 363 (H) 38 - 126 U/L   Total Bilirubin 1.0 0.3 - 1.2 mg/dL   GFR, Estimated 8 (L) >60 mL/min    Comment: (NOTE) Calculated using the CKD-EPI Creatinine Equation (2021)    Anion gap 11 5 - 15    Comment: Performed at [facility], [place] [address] [place], [place] [address]  Brain natriuretic peptide     Status: Abnormal   Collection Time: 09/02/22  1:07 PM  Result Value Ref Range   B Natriuretic Peptide 622.3 (H) 0.0 - 100.0 pg/mL    Comment: Performed at  [facility], [place] [address] [place], [place] [address]  Troponin I (High Sensitivity)     Status: Abnormal   Collection Time: 09/02/22  1:12 PM  Result Value Ref Range   Troponin I (High Sensitivity) 54 (H) <18 ng/L    Comment: (NOTE) Elevated high sensitivity troponin I (hsTnI) values and significant  changes across serial measurements may suggest ACS but many other  chronic and acute conditions are known to elevate hsTnI results.  Refer to the "Links" section for chest pain algorithms and additional  guidance. Performed at [facility], [place] [address] [place], [place] [address]    DG Chest Portable 1 View  Result Date: 09/02/2022 CLINICAL DATA:  Shortness of breath EXAM: PORTABLE CHEST 1 VIEW COMPARISON:  07/23/2022 FINDINGS: Cardiomegaly. Unchanged diffuse bilateral interstitial opacity. The visualized skeletal structures are unremarkable. IMPRESSION: Cardiomegaly with unchanged diffuse bilateral interstitial opacity, likely edema. No new or focal airspace opacity. Electronically Signed   By: Delanna Ahmadi M.D.   On: 09/02/2022 14:10    Pending Labs Unresulted Labs (From admission, onward)     Start     Ordered   09/03/22 0500  CBC  Tomorrow morning,   R        09/02/22 1528   09/02/22 [id number]  Basic metabolic panel  Now then every 4 hours,   R (with TIMED occurrences)      09/02/22 1539   09/02/22 1454  Magnesium  Once,   STAT        09/02/22 1453   09/02/22 1430   Osmolality  Once,   R        09/02/22 1430   09/02/22 1314  Gastrointestinal Panel by PCR , Stool  (Gastrointestinal Panel by PCR, Stool                                                                                                                                                     **Does Not include CLOSTRIDIUM DIFFICILE testing. **If CDIFF testing is needed, place order from the "C Difficile Testing" order set.**)  Once,   URGENT        09/02/22 1313   09/02/22 1246  Na and K (sodium & potassium), rand urine  Once,   URGENT        09/02/22 1245   09/02/22 1246  Osmolality, urine  Once,   URGENT        09/02/22 1245   09/02/22 1246  Urinalysis, Routine w reflex microscopic -Urine, Clean Catch  Once,   URGENT       Question:  Specimen Source  Answer:  Urine, Clean Catch   09/02/22 1245            Vitals/Pain Today's Vitals   09/02/22 1238 09/02/22 1242 09/02/22 1244 09/02/22 1247  BP: 125/70  Pulse:  74    Resp:  18    Temp:    (!) 97.4 F (36.3 C)  TempSrc:    Oral  SpO2:  100%    PainSc:   0-No pain     Isolation Precautions Enteric precautions (UV disinfection)  Medications Medications  tamsulosin (FLOMAX) capsule 0.4 mg (has no administration in time range)  pantoprazole (PROTONIX) EC tablet 40 mg (has no administration in time range)  levothyroxine (SYNTHROID) tablet 50 mcg (has no administration in time range)  metoprolol succinate (TOPROL-XL) 24 hr tablet 12.5 mg (has no administration in time range)  isosorbide-hydrALAZINE (BIDIL) 20-37.5 MG per tablet 1 tablet (has no administration in time range)  atorvastatin (LIPITOR) tablet 10 mg (has no administration in time range)  timolol (TIMOPTIC) 0.5 % ophthalmic solution 1 drop (has no administration in time range)  heparin injection 5,000 Units (has no administration in time range)  sodium chloride flush (NS) 0.9 % injection 3 mL (has no administration in time range)  acetaminophen (TYLENOL) tablet 650 mg (has  no administration in time range)    Or  acetaminophen (TYLENOL) suppository 650 mg (has no administration in time range)  polyethylene glycol (MIRALAX / GLYCOLAX) packet 17 g (has no administration in time range)  potassium chloride SA (KLOR-CON M) CR tablet 20 mEq (20 mEq Oral Given 09/02/22 1531)    Mobility walks with person assist and walker     Focused Assessments Neuro Assessment Handoff:           Neuro Assessment:   Neuro Checks:      Has TPA been given? No If patient is a Neuro Trauma and patient is going to OR before floor call report to [facility] nurse: [phone] or [phone]   R Recommendations: See Admitting Provider Note  Report given to:   Additional Notes:

## 2022-09-02 NOTE — Telephone Encounter (Signed)
[facility] lab called report on critical results for Na 119;  Creatinine 6.11;  and GFR 7.50. sending note to Dr Glori Bickers, [employer] and will teams [place] CMA. Will log in lab notebook also.

## 2022-09-02 NOTE — Telephone Encounter (Signed)
He is in the ER now  Will follow

## 2022-09-02 NOTE — Telephone Encounter (Signed)
Unable to reach pt or pts wife. I spoke with [name] (DPR signed) [name] is unable to reach pt or her mom by any contact #.[name] notified as instructed by Dr Josefine Class note and [name] voiced understanding.[name] is going to get ready to go to her parents home and [name] will take pt to [facility] ED. [name] said if pt refuses to go [name] will call [facility] triage to talk with pt. [name] said she is not sure how pt is feeling today and is not sure if pt has had any N&V, loss of energy or confusion since she has not spoken with her parents today.  Sending note to Dr [name], Dr Glori Bickers and [place] pool as Juluis Rainier. Since was called lab report as critical on Na 119;  creatinine 6.11 and GFR 7.50 and results are not in Epic yet. I called  triage to let them know. I spoke with Alana and gave above results and let Alana know that pts daughter is bringing pt to ED. Pts wife called back and I explained that Dr [name] wanted pt to go to ED due to Na level and pts wife and pt were on speaker phone and pt said if  he had to go to ED he would.pts wife said pt cannot stand very long but is not sure pt is feeling weaker, pt ate a cracker last night and vomited and has not noticed confusion. I called [name] to let her know pt had agreed to go to ED and she said her daughter was going to pick pt up and take him to [facility] ED and [name] would meet them there.

## 2022-09-02 NOTE — H&P (Addendum)
History and Physical   ETHANMICHAEL [name] [id number] DOB: 09-09-1928 DOA: 09/02/2022  PCP: Tonia Ghent, MD   Patient coming from: Home/PCP  Chief Complaint: Hyponatremia  HPI: Rick [name] is a 89 y.o. male with medical history significant of GI bleed, mitral regurgitation, CHF, prostate cancer, glaucoma, polycystic kidney disease, hypothyroidism, hyperlipidemia, GERD, hypertension, CKD 4, BPH, anemia presenting with hyponatremia.  Patient was seen at PCP office yesterday due to a week of 2-3 episodes of loose stool.  Diarrhea is somewhat improving at this time and no abdominal cramping since yesterday.  Did have an episode of emesis yesterday.  Had labs checked at PCP yesterday and sodium came back at 119 he was sent to the ED for further evaluation.  Is reporting some generalized weakness and shortness of breath today.  Denies fevers, chills, chest pain, abdominal pain.  Patient was recently admitted for GI bleed and noted to have some hyponatremia around 130 at that time thought to be secondary to CHF.  ED Course: Vital signs in the ED  stable.  Lab workup included CMP with sodium 121, potassium 3.2, chloride 91, bicarb 19, BUN 60, creatinine of 6.42 from baseline of 5.8, calcium 7.5, protein 6.4, albumin 1.8, alk phos 363.  CBC with hemoglobin stable at 9.2.  BNP elevated to 622.  Troponin pending.  Urinalysis pending.  Urine sodium, potassium, osmolality pending.  Serum osmolality pending.  GI pathogen panel pending.  Magnesium pending.  Chest x-ray showed cardiomegaly with stable opacities likely representing edema.  Patient received 20 mill equivalents p.o. potassium in the ED.  Review of Systems: As per HPI otherwise all other systems reviewed and are negative.  Past Medical History:  Diagnosis Date   Acute hypoxemic respiratory failure ([place]) [id number]   Acute metabolic encephalopathy [id number]   Acute on chronic heart failure with preserved ejection fraction (HFpEF)  ([place]) 05/02/2021   Anemia 01/16/2015   Arthritis    CHF (congestive heart failure) (HCC)    GERD (gastroesophageal reflux disease)    History of colon polyps    History of prostate cancer    Hyperlipidemia    Hypertension    Kidney cysts    bilateral.  Renal US   Mitral regurgitation 12/02/2020    Past Surgical History:  Procedure Laterality Date   BIOPSY  05/05/2021   Procedure: BIOPSY;  Surgeon: Thornton Park, MD;  Location: [place];  Service: Gastroenterology;;   COLONOSCOPY     CYSTOSCOPY  02/12/99   biopsy   ESOPHAGEAL BANDING N/A 08/04/2012   Procedure: ESOPHAGEAL BANDING;  Surgeon: Inda Castle, MD;  Location: [facility];  Service: Endoscopy;  Laterality: N/A;   ESOPHAGOGASTRODUODENOSCOPY N/A 08/04/2012   Procedure: ESOPHAGOGASTRODUODENOSCOPY (EGD);  Surgeon: Inda Castle, MD;  Location: [facility];  Service: Endoscopy;  Laterality: N/A;   ESOPHAGOGASTRODUODENOSCOPY (EGD) WITH PROPOFOL N/A 05/05/2021   Procedure: ESOPHAGOGASTRODUODENOSCOPY (EGD) WITH PROPOFOL;  Surgeon: Thornton Park, MD;  Location: [place];  Service: Gastroenterology;  Laterality: N/A;   ESOPHAGOGASTRODUODENOSCOPY (EGD) WITH PROPOFOL N/A 08/14/2022   Procedure: ESOPHAGOGASTRODUODENOSCOPY (EGD) WITH PROPOFOL;  Surgeon: Yetta Flock, MD;  Location: [place];  Service: Gastroenterology;  Laterality: N/A;   HEMOSTASIS CLIP PLACEMENT  08/14/2022   Procedure: HEMOSTASIS CLIP PLACEMENT;  Surgeon: Yetta Flock, MD;  Location: [place] [facility]COPY;  Service: Gastroenterology;;   HOT HEMOSTASIS N/A 08/14/2022   Procedure: HOT HEMOSTASIS (ARGON PLASMA COAGULATION/BICAP);  Surgeon: Yetta Flock, MD;  Location: [facility] ENDOSCOPY;  Service: Gastroenterology;  Laterality: N/A;  INGUINAL HERNIA REPAIR  02/1999   Dr. Reece Agar   POLYPECTOMY  05/05/2021   Procedure: POLYPECTOMY;  Surgeon: Thornton Park, MD;  Location[facility];  Service: Gastroenterology;;   PROSTATECTOMY  02/1999     Social History  reports that he quit smoking about 21 years ago. His smoking use included cigarettes. He has quit using smokeless tobacco.  His smokeless tobacco use included chew. He reports current alcohol use. He reports that he does not use drugs.  Allergies  Allergen Reactions   Lisinopril     cough   Nsaids     Elevated Cr    Family History  Problem Relation Age of Onset   Bone cancer Sister    Stomach cancer Brother    Stomach cancer Brother    Lung cancer Sister    Colon cancer Sister    Stroke Mother    Colon cancer Other        nephew  Reviewed on admission  Prior to Admission medications   Medication Sig Start Date End Date Taking? Authorizing Provider  acetaminophen (TYLENOL) 325 MG tablet Take 2 tablets (650 mg total) by mouth every 6 (six) hours as needed. 11/01/21   Jill Alexanders, PA-C  atorvastatin (LIPITOR) 10 MG tablet Take 1 tablet (10 mg total) by mouth daily. 08/06/22   Tonia Ghent, MD  ferrous sulfate 325 (65 FE) MG tablet Take 1 tablet (325 mg total) by mouth daily with breakfast. 11/21/21 11/21/22  Tonia Ghent, MD  furosemide (LASIX) 40 MG tablet Take 1 tablet (40 mg total) by mouth daily. 06/10/22 09/08/22  Mariel Aloe, MD  isosorbide-hydrALAZINE (BIDIL) 20-37.5 MG tablet Take 1 tablet by mouth 2 (two) times daily. 06/09/22 09/07/22  Mariel Aloe, MD  latanoprost (XALATAN) 0.005 % ophthalmic solution Place 1 drop into both eyes at bedtime.    [provider]  levothyroxine (SYNTHROID) 50 MCG tablet Take 1 tablet (50 mcg total) by mouth daily. Take on empty stomach early in the morning. 12/29/21   Tonia Ghent, MD  metoprolol succinate (TOPROL-XL) 25 MG 24 hr tablet Take 0.5 tablets (12.5 mg total) by mouth daily. 08/05/22 09/04/22  Arrien, Jimmy Picket, MD  pantoprazole (PROTONIX) 40 MG tablet Take 1 tablet (40 mg total) by mouth 2 (two) times daily before a meal. 08/16/22 11/14/22  Dahal, Marlowe Aschoff, MD  tamsulosin (FLOMAX) 0.4 MG  CAPS capsule Take 0.4 mg by mouth daily. 05/30/22   [provider]  timolol (BETIMOL) 0.5 % ophthalmic solution Place 1 drop into both eyes 2 (two) times daily.    [provider]  timolol (TIMOPTIC) 0.5 % ophthalmic solution 1 drop every morning. 08/29/22   [provider]    Physical Exam: Vitals:   09/02/22 1238 09/02/22 1242 09/02/22 1247  BP: 125/70    Pulse:  74   Resp:  18   Temp:   (!) 97.4 F (36.3 C)  TempSrc:   Oral  SpO2:  100%     Physical Exam Constitutional:      General: He is not in acute distress.    Appearance: Normal appearance.  HENT:     Head: Normocephalic and atraumatic.     Mouth/Throat:     Mouth: Mucous membranes are moist.     Pharynx: Oropharynx is clear.  Eyes:     Extraocular Movements: Extraocular movements intact.     Pupils: Pupils are equal, round, and reactive to light.  Cardiovascular:     Rate  and Rhythm: Normal rate and regular rhythm.     Pulses: Normal pulses.     Heart sounds: Normal heart sounds.  Pulmonary:     Effort: Pulmonary effort is normal. No respiratory distress.     Breath sounds: Normal breath sounds.  Abdominal:     General: Bowel sounds are normal. There is no distension.     Palpations: Abdomen is soft.     Tenderness: There is no abdominal tenderness.  Musculoskeletal:        General: No swelling or deformity.     Right lower leg: Edema present.     Left lower leg: Edema present.  Skin:    General: Skin is warm and dry.  Neurological:     General: No focal deficit present.     Mental Status: Mental status is at baseline.    Labs on Admission: I have personally reviewed following labs and imaging studies  CBC: Recent Labs  Lab 09/02/22 1245  WBC 8.7  NEUTROABS 8.0*  HGB 9.2*  HCT 28.3*  MCV 87.3  PLT 0000000    Basic Metabolic Panel: Recent Labs  Lab 09/02/22 1245  NA 121*  K 3.2*  CL 91*  CO2 19*  GLUCOSE 73  BUN 60*  CREATININE 6.42*  CALCIUM 7.5*     GFR: Estimated Creatinine Clearance: 7 mL/min (A) (by C-G formula based on SCr of 6.42 mg/dL (H)).  Liver Function Tests: Recent Labs  Lab 09/02/22 1245  AST 30  ALT 12  ALKPHOS 363*  BILITOT 1.0  PROT 6.4*  ALBUMIN 1.8*    Urine analysis:    Component Value Date/Time   COLORURINE YELLOW 07/23/2022 1850   APPEARANCEUR TURBID (A) 07/23/2022 1850   LABSPEC 1.013 07/23/2022 1850   PHURINE 6.0 07/23/2022 1850   GLUCOSEU NEGATIVE 07/23/2022 1850   GLUCOSEU NEGATIVE 02/27/2022 1237   HGBUR SMALL (A) 07/23/2022 1850   BILIRUBINUR NEGATIVE 07/23/2022 1850   KETONESUR 5 (A) 07/23/2022 1850   PROTEINUR >300 (A) 07/23/2022 1850   UROBILINOGEN 0.2 02/27/2022 1237   NITRITE NEGATIVE 07/23/2022 1850   LEUKOCYTESUR MODERATE (A) 07/23/2022 1850    Radiological Exams on Admission: DG Chest Portable 1 View  Result Date: 09/02/2022 CLINICAL DATA:  Shortness of breath EXAM: PORTABLE CHEST 1 VIEW COMPARISON:  07/23/2022 FINDINGS: Cardiomegaly. Unchanged diffuse bilateral interstitial opacity. The visualized skeletal structures are unremarkable. IMPRESSION: Cardiomegaly with unchanged diffuse bilateral interstitial opacity, likely edema. No new or focal airspace opacity. Electronically Signed   By: Delanna Ahmadi M.D.   On: 09/02/2022 14:10    EKG: Independently reviewed.  Sinus rhythm at 74 bpm.  Low voltage multiple leads.  Right bundle branch block.  Nonspecific T wave flattening.  Similar to previous.  Assessment/Plan Principal Problem:   Hyponatremia Active Problems:   Polycystic kidney disease   Glaucoma   Chronic kidney disease   Essential hypertension   Hypothyroidism   HYPERCHOLESTEROLEMIA   GERD without esophagitis   History of prostate cancer   Anemia due to stage 4 chronic kidney disease (HCC)   BPH (benign prostatic hyperplasia)   Hyponatremia AKI on CKD 4 Polycystic kidney disease > Patient noted to have sodium of 119 at PCP yesterday.  121 in the ED today.  Has  had some chronic hyponatremia around 130 in the past believed to be due to his CHF. > Patient with known history of CHF as below with pulmonary edema on chest x-ray and lower extremity edema on exam, likely representing hypervolemic hyponatremia. >  In addition noted to have elevated creatinine to 6.42 from baseline of 5.8. > Given hypervolemic hyponatremia will start with fluid restriction and give a dose of IV lasix.  - Monitor on progressive unit - Trend renal function and electrolytes every 4 hours overnight - N.p.o. except sips with meds - Follow-up urine electrolytes and osmolality - Follow-up serum osmolality - Add on TSH  Acute on chronic combined systolic and diastolic CHF > Last echo in 2023 with EF 30-35%, indeterminate diastolic function, normal RV function. > At this time has BNP of 622 with electrolyte abnormalities as above.  Does have shortness of breath but no hypoxia.  Edema on chest x-ray and exam. - Monitoring on progressive unit as above - Fluid restriction and diuresis as above - Continue home metoprolol, BiDil - Check magnesium, trend electrolytes as above  Hypothyroidism > Did have some mild hypothermia in the ED he upper 95 F to 97's degrees Fahrenheit in ED. - Continue home Synthroid - Passive rewarming - Check TSH as above  GERD - Continue home PPI  Hypertension - Continue home metoprolol, BiDil - Diuresis as above  BPH - Continue home tamsulosin  Anemia > Hemoglobin stable at 9.2. - Continue to trend CBC  DVT prophylaxis: Heparin Code Status:   Full  Family Communication:  Updated at bedside Disposition Plan:   Patient is from:  Home  Anticipated DC to:  Home  Anticipated DC date:  1 to 3 days  Anticipated DC barriers: None  Consults called:  None Admission status:  Observation, progressive  Severity of Illness: The appropriate patient status for this patient is OBSERVATION. Observation status is judged to be reasonable and necessary in  order to provide the required intensity of service to ensure the patient's safety. The patient's presenting symptoms, physical exam findings, and initial radiographic and laboratory data in the context of their medical condition is felt to place them at decreased risk for further clinical deterioration. Furthermore, it is anticipated that the patient will be medically stable for discharge from the hospital within 2 midnights of admission.    Marcelyn Bruins MD [facility]  How to contact the [facility] Attending or Consulting provider [place] or covering provider during after hours [place], for this patient?   Check the care team in [facility] and look for a) attending/consulting TR[facility] provider listed and b) the [facility] team listed Log into [email or web address] and use [place]'s universal password to access. If you do not have the password, please contact the hospital operator. Locate the [facility] provider you are looking for under [facility] and page to a number that you can be directly reached. If you still have difficulty reaching the provider, please page the [facility] (Director on Call) for the Hospitalists listed on amion for assistance.  09/02/2022, 3:51 PM

## 2022-09-02 NOTE — ED Triage Notes (Addendum)
Pt sent by PCP for sodium level of 119; endorses diarrhea x 2-3 days prior to blood work (drawn yesterday), has resolved now; family states pt has hx stage 4 kidney disease, CHF; pt has no complaints at this time

## 2022-09-02 NOTE — Plan of Care (Signed)
  Problem: Education: Goal: Knowledge of General Education information will improve Description Including pain rating scale, medication(s)/side effects and non-pharmacologic comfort measures Outcome: Progressing   

## 2022-09-02 NOTE — Telephone Encounter (Signed)
With sodium 119, would go to ER.  thanks.

## 2022-09-02 NOTE — Telephone Encounter (Signed)
I spoke with Dr Glori Bickers and she said she saw pt on 09/01/22 with diarrhea; pt had recent GI bleed and to let Dr [name] be aware of these results. Sending to Dr Randa Ngo pool and I spoke with [facility] CMA.

## 2022-09-02 NOTE — ED Notes (Addendum)
MD Trilby Drummer made aware of rectal temp 95.8, warm blankets applied.

## 2022-09-03 ENCOUNTER — Telehealth (HOSPITAL_COMMUNITY): Payer: Self-pay | Admitting: Pharmacy Technician

## 2022-09-03 ENCOUNTER — Other Ambulatory Visit (HOSPITAL_COMMUNITY): Payer: Self-pay

## 2022-09-03 DIAGNOSIS — A0472 Enterocolitis due to Clostridium difficile, not specified as recurrent: Secondary | ICD-10-CM | POA: Diagnosis present

## 2022-09-03 DIAGNOSIS — N189 Chronic kidney disease, unspecified: Secondary | ICD-10-CM

## 2022-09-03 DIAGNOSIS — I959 Hypotension, unspecified: Secondary | ICD-10-CM | POA: Diagnosis not present

## 2022-09-03 DIAGNOSIS — E8809 Other disorders of plasma-protein metabolism, not elsewhere classified: Secondary | ICD-10-CM | POA: Diagnosis present

## 2022-09-03 DIAGNOSIS — N185 Chronic kidney disease, stage 5: Secondary | ICD-10-CM

## 2022-09-03 DIAGNOSIS — N179 Acute kidney failure, unspecified: Secondary | ICD-10-CM | POA: Diagnosis present

## 2022-09-03 DIAGNOSIS — E162 Hypoglycemia, unspecified: Secondary | ICD-10-CM | POA: Diagnosis present

## 2022-09-03 DIAGNOSIS — Z87891 Personal history of nicotine dependence: Secondary | ICD-10-CM | POA: Diagnosis not present

## 2022-09-03 DIAGNOSIS — R54 Age-related physical debility: Secondary | ICD-10-CM | POA: Diagnosis present

## 2022-09-03 DIAGNOSIS — R197 Diarrhea, unspecified: Secondary | ICD-10-CM | POA: Diagnosis not present

## 2022-09-03 DIAGNOSIS — I5020 Unspecified systolic (congestive) heart failure: Secondary | ICD-10-CM | POA: Diagnosis not present

## 2022-09-03 DIAGNOSIS — E7800 Pure hypercholesterolemia, unspecified: Secondary | ICD-10-CM | POA: Diagnosis present

## 2022-09-03 DIAGNOSIS — Z6820 Body mass index (BMI) 20.0-20.9, adult: Secondary | ICD-10-CM | POA: Diagnosis not present

## 2022-09-03 DIAGNOSIS — I5042 Chronic combined systolic (congestive) and diastolic (congestive) heart failure: Secondary | ICD-10-CM | POA: Diagnosis present

## 2022-09-03 DIAGNOSIS — Z515 Encounter for palliative care: Secondary | ICD-10-CM | POA: Diagnosis not present

## 2022-09-03 DIAGNOSIS — A498 Other bacterial infections of unspecified site: Secondary | ICD-10-CM | POA: Insufficient documentation

## 2022-09-03 DIAGNOSIS — E871 Hypo-osmolality and hyponatremia: Secondary | ICD-10-CM | POA: Diagnosis present

## 2022-09-03 DIAGNOSIS — Q613 Polycystic kidney, unspecified: Secondary | ICD-10-CM | POA: Diagnosis not present

## 2022-09-03 DIAGNOSIS — Z7189 Other specified counseling: Secondary | ICD-10-CM | POA: Diagnosis not present

## 2022-09-03 DIAGNOSIS — D631 Anemia in chronic kidney disease: Secondary | ICD-10-CM | POA: Diagnosis present

## 2022-09-03 DIAGNOSIS — I5043 Acute on chronic combined systolic (congestive) and diastolic (congestive) heart failure: Secondary | ICD-10-CM | POA: Diagnosis not present

## 2022-09-03 DIAGNOSIS — Z79890 Hormone replacement therapy (postmenopausal): Secondary | ICD-10-CM | POA: Diagnosis not present

## 2022-09-03 DIAGNOSIS — E039 Hypothyroidism, unspecified: Secondary | ICD-10-CM | POA: Diagnosis present

## 2022-09-03 DIAGNOSIS — I10 Essential (primary) hypertension: Secondary | ICD-10-CM | POA: Diagnosis not present

## 2022-09-03 DIAGNOSIS — Z634 Disappearance and death of family member: Secondary | ICD-10-CM | POA: Diagnosis not present

## 2022-09-03 DIAGNOSIS — H409 Unspecified glaucoma: Secondary | ICD-10-CM | POA: Diagnosis present

## 2022-09-03 DIAGNOSIS — E872 Acidosis, unspecified: Secondary | ICD-10-CM | POA: Diagnosis present

## 2022-09-03 DIAGNOSIS — I132 Hypertensive heart and chronic kidney disease with heart failure and with stage 5 chronic kidney disease, or end stage renal disease: Secondary | ICD-10-CM | POA: Diagnosis present

## 2022-09-03 DIAGNOSIS — N400 Enlarged prostate without lower urinary tract symptoms: Secondary | ICD-10-CM | POA: Diagnosis present

## 2022-09-03 DIAGNOSIS — Z66 Do not resuscitate: Secondary | ICD-10-CM | POA: Diagnosis not present

## 2022-09-03 DIAGNOSIS — K219 Gastro-esophageal reflux disease without esophagitis: Secondary | ICD-10-CM | POA: Diagnosis present

## 2022-09-03 LAB — GLUCOSE, CAPILLARY
Glucose-Capillary: 43 mg/dL — CL (ref 70–99)
Glucose-Capillary: 71 mg/dL (ref 70–99)
Glucose-Capillary: 73 mg/dL (ref 70–99)
Glucose-Capillary: 102 mg/dL — ABNORMAL HIGH (ref 70–99)
Glucose-Capillary: 109 mg/dL — ABNORMAL HIGH (ref 70–99)
Glucose-Capillary: 127 mg/dL — ABNORMAL HIGH (ref 70–99)

## 2022-09-03 LAB — BASIC METABOLIC PANEL
Anion gap: 15 (ref 5–15)
Anion gap: 13 (ref 5–15)
BUN: 60 mg/dL — ABNORMAL HIGH (ref 8–23)
BUN: 62 mg/dL — ABNORMAL HIGH (ref 8–23)
CO2: 16 mmol/L — ABNORMAL LOW (ref 22–32)
CO2: 20 mmol/L — ABNORMAL LOW (ref 22–32)
Calcium: 7.5 mg/dL — ABNORMAL LOW (ref 8.9–10.3)
Calcium: 7.5 mg/dL — ABNORMAL LOW (ref 8.9–10.3)
Chloride: 91 mmol/L — ABNORMAL LOW (ref 98–111)
Chloride: 89 mmol/L — ABNORMAL LOW (ref 98–111)
Creatinine, Ser: 6.14 mg/dL — ABNORMAL HIGH (ref 0.61–1.24)
Creatinine, Ser: 6.28 mg/dL — ABNORMAL HIGH (ref 0.61–1.24)
GFR, Estimated: 8 mL/min — ABNORMAL LOW (ref >60)
GFR, Estimated: 8 mL/min — ABNORMAL LOW (ref >60)
Glucose, Bld: 47 mg/dL — ABNORMAL LOW (ref 70–99)
Glucose, Bld: 36 mg/dL — CL (ref 70–99)
Potassium: 3.3 mmol/L — ABNORMAL LOW (ref 3.5–5.1)
Potassium: 3.5 mmol/L (ref 3.5–5.1)
Sodium: 122 mmol/L — ABNORMAL LOW (ref 135–145)
Sodium: 122 mmol/L — ABNORMAL LOW (ref 135–145)

## 2022-09-03 LAB — C DIFFICILE QUICK SCREEN W PCR REFLEX
C Diff antigen: POSITIVE — AB
C Diff interpretation: DETECTED
C Diff toxin: POSITIVE — AB

## 2022-09-03 LAB — GASTROINTESTINAL PANEL BY PCR, STOOL (REPLACES STOOL CULTURE)

## 2022-09-03 LAB — ALBUMIN: Albumin: 1.7 g/dL — ABNORMAL LOW (ref 3.5–5.0)

## 2022-09-03 LAB — CBC
HCT: 27.5 % — ABNORMAL LOW (ref 39.0–52.0)
Hemoglobin: 9.1 g/dL — ABNORMAL LOW (ref 13.0–17.0)
MCH: 28.5 pg (ref 26.0–34.0)
MCHC: 33.1 g/dL (ref 30.0–36.0)
MCV: 86.2 fL (ref 80.0–100.0)
Platelets: 186 10*3/uL (ref 150–400)
RBC: 3.19 MIL/uL — ABNORMAL LOW (ref 4.22–5.81)
RDW: 16.2 % — ABNORMAL HIGH (ref 11.5–15.5)
WBC: 11.6 10*3/uL — ABNORMAL HIGH (ref 4.0–10.5)
nRBC: 0 % (ref 0.0–0.2)

## 2022-09-03 LAB — CORTISOL: Cortisol, Plasma: 15.5 ug/dL

## 2022-09-03 LAB — MAGNESIUM: Magnesium: 1.9 mg/dL (ref 1.7–2.4)

## 2022-09-03 MED ORDER — POTASSIUM CHLORIDE CRYS ER 20 MEQ PO TBCR
40.0000 meq | EXTENDED_RELEASE_TABLET | ORAL | Status: AC
  Administered 2022-09-03 (×2): 40 meq via ORAL
  Filled 2022-09-03 (×2): qty 2

## 2022-09-03 MED ORDER — DEXTROSE-NACL 5-0.9 % IV SOLN: INTRAVENOUS | Status: DC

## 2022-09-03 MED ORDER — LEVOTHYROXINE SODIUM 75 MCG PO TABS
75.0000 ug | ORAL_TABLET | Freq: Every day | ORAL | Status: DC
  Administered 2022-09-04 – 2022-09-08 (×5): 75 ug via ORAL
  Filled 2022-09-03 (×5): qty 1

## 2022-09-03 MED ORDER — VANCOMYCIN HCL 125 MG PO CAPS
125.0000 mg | ORAL_CAPSULE | Freq: Four times a day (QID) | ORAL | Status: DC
  Administered 2022-09-03 – 2022-09-06 (×12): 125 mg via ORAL
  Filled 2022-09-03 (×14): qty 1

## 2022-09-03 MED ORDER — COSYNTROPIN 0.25 MG IJ SOLR
0.2500 mg | Freq: Once | INTRAMUSCULAR | Status: AC
  Administered 2022-09-04: 0.25 mg via INTRAVENOUS
  Filled 2022-09-03: qty 0.25

## 2022-09-03 NOTE — TOC Benefit Eligibility Note (Signed)
Patient Teacher, English as a foreign language completed.    The patient is currently admitted and upon discharge could be taking Vancomycin 125 mg capsules.  Prior Authorization Required  The patient is insured through [employer] Part D   This test claim was processed through [place] amounts may vary at other pharmacies due to pharmacy/plan contracts, or as the patient moves through the different stages of their insurance plan.  Lyndel Safe, [place] Patient Advocate Specialist [place] Patient Advocate Team Direct Number: [phone]  Fax: [phone]

## 2022-09-03 NOTE — Progress Notes (Signed)
   09/03/22 0407  Vitals  Temp (!) 97.2 F (36.2 C)  Temp Source Oral  BP (!) 88/56  MAP (mmHg) 67  BP Location Left Arm  BP Method Automatic  Patient Position (if appropriate) Lying  Pulse Rate 60  Pulse Rate Source Monitor  ECG Heart Rate 60  Resp 17  Level of Consciousness  Level of Consciousness Alert  MEWS COLOR  MEWS Score Color Green  Oxygen Therapy  SpO2 100 %  O2 Device Room Air  Pain Assessment  Pain Scale 0-10  Pain Score 0  Height and Weight  Weight 61.7 kg  Type of Scale Used Bed  Type of Weight Actual  BMI (Calculated) 20.08  MEWS Score  MEWS Temp 0  MEWS Systolic 1  MEWS Pulse 0  MEWS RR 0  MEWS LOC 0  MEWS Score 1   Dr. Velia Meyer made aware of the pt's low BP. Scheduled hydralazine and isosorbide on hold and metoprolol updated with parameters. Will continue to monitor.

## 2022-09-03 NOTE — Progress Notes (Signed)
PROGRESS NOTE  Rick [name] [id number] DOB: Nov 04, 1928   PCP: Tonia Ghent, MD  Patient is from: Home  DOA: 09/02/2022 LOS: 0  Chief complaints Chief Complaint  Patient presents with   hyponatremia     Brief Narrative / Interim history: 89 year old M with PMH of combined CHF, MR, CKD-5, PCKD, hyponatremia, GIB, prostate cancer, BPH and hypertension directed to ED by PCP for hyponatremia.  See in by PCP for generalized weakness, diarrhea and shortness of breath.  Lab work significant for hyponatremia to 190 and AKI.    In ED, stable vitals. Na 121. Cr 6.42 (baseline 5.8).  BUN 60.  K3.2.  GIB ordered.  Hyponatremia felt to be hypervolemic.  Started on IV Lasix and fluid restriction.   The next day, C. difficile panel positive.  Started on p.o. vancomycin   Subjective: Seen and examined earlier this morning.  Hypoglycemic to 43 but resolved with orange juice.  Ongoing diarrhea.  He was also slightly hypotensive.  No complaints.  Patient's daughter at bedside.  Objective: Vitals:   09/03/22 0407 09/03/22 0552 09/03/22 0806 09/03/22 1124  BP: (!) 88/56 (!) 90/58 (!) 100/58 103/61  Pulse: 60 63 62 60  Resp: 17   18  Temp: (!) 97.2 F (36.2 C)     TempSrc: Oral     SpO2: 100% 100%  98%  Weight: 61.7 kg     Height:        Examination:  GENERAL: Appears frail.  No apparent distress. HEENT: MMM.  Vision and hearing grossly intact.  NECK: Supple.  No apparent JVD.  RESP:  No IWOB.  Fair aeration bilaterally. CVS:  RRR. Heart sounds normal.  ABD/GI/GU: BS+. Abd soft, NTND.  MSK/EXT:  Moves extremities. No apparent deformity.  1+ pedal edema bilaterally. SKIN: no apparent skin lesion or wound NEURO: Awake, alert and oriented appropriately.  No apparent focal neuro deficit. PSYCH: Calm. Normal affect.   Procedures:  None  Microbiology summarized: GIP negative C. difficile positive  Assessment and plan: Principal Problem:   Hyponatremia Active Problems:    Polycystic kidney disease   Glaucoma   Essential hypertension   Hypothyroidism   HYPERCHOLESTEROLEMIA   GERD without esophagitis   History of prostate cancer   Anemia due to stage 4 chronic kidney disease (HCC)   BPH (benign prostatic hyperplasia)   Acute kidney injury superimposed on chronic kidney disease (HCC)   Clostridioides difficile infection   Goals of care, counseling/discussion   Hypoglycemia   C. difficile diarrhea: C. difficile testing positive.  Ongoing diarrhea.  Has mild leukocytosis. -Start p.o. vancomycin -Continue D5-NS -Continue enteric precaution  AKI on CKD-5/history of PCKD: Baseline Cr 5.5-5.7.  AKI likely due to GI loss.  He has lower extremity edema but intravascularly dry.  Not a candidate for dialysis. Recent Labs    08/14/22 0439 08/15/22 0434 08/16/22 0459 08/21/22 1317 09/01/22 1547 09/02/22 1245 09/02/22 1804 09/02/22 2040 09/03/22 0058 09/03/22 0825  BUN 91* 86* 77* 66* 57* 60* 60* 61* 60* 62*  CREATININE 5.76* 5.75* 5.49* 5.75* 6.11* 6.42* 6.46* 6.29* 6.14* 6.28*  -Mild ACDF as above -IV fluid challenge as above.  Hyponatremia: Patient appears dry on exam other than BLE edema likely from hypoalbuminemia and AKI.  His TSH is very high -IV fluid challenge as above -Increase home Synthroid -Check cortisol, ACTH and ACTH stimulation test   Chronic combined systolic and diastolic CHF: Doubt acute exacerbation.  BNP elevation likely chronic and from renal failure. -Hold further  diarrhea 6 -IV fluid challenge -Closely monitor respiratory and fluid status, renal functions and electrolytes   Hypothyroidism: TSH elevated to 27.2. -Increase home Synthroid from 50 to 75 mcg -Recheck TSH in 4 to 6 weeks outpatient  Hypoglycemia: Likely due to diarrhea and poor p.o. intake. -Evaluate for adrenal insufficiency -Continue D5 with NS   GERD -Continue home PPI   Hypertension: Soft blood pressures. -Continue low-dose Toprol -Continue holding  BiDil   BPH -Continue home tamsulosin   Anemia of allergies: Stable. -Continue monitoring  Goal of care counseling: Advanced age with advanced kidney disease and heart disease as above.  Poor long-term prognosis.  Discussed about CODE STATUS including pros and cons of CPR or intubation.  Patient and daughter are undecided, and likes to discuss with the rest of family members -Continue full comfort care -Palliative care consult  Hypoalbuminemia -Optimize nutrition.   Body mass index is 20.09 kg/m.          DVT prophylaxis:  heparin injection 5,000 Units Start: 09/02/22 2200  Code Status: Full code Family Communication: Updated patient's daughter at bedside Level of care: Progressive Status is: Inpatient Remains inpatient appropriate because: AKI, hyponatremia, hypoglycemia and C. difficile infection   Final disposition: TBD Consultants:  Palliative medicine  55 minutes with more than 50% spent in reviewing records, counseling patient/family and coordinating care.   Sch Meds:  Scheduled Meds:  atorvastatin  10 mg Oral Daily   [START ON 09/04/2022] cosyntropin  0.25 mg Intravenous Once   heparin  5,000 Units Subcutaneous Q8H   [START ON 09/04/2022] levothyroxine  75 mcg Oral Q0600   metoprolol succinate  12.5 mg Oral Daily   pantoprazole  40 mg Oral BID AC   sodium chloride flush  3 mL Intravenous Q12H   tamsulosin  0.4 mg Oral Daily   timolol  1 drop Both Eyes q morning   vancomycin  125 mg Oral QID   Continuous Infusions:  dextrose 5 % and 0.9% NaCl     PRN Meds:.acetaminophen **OR** acetaminophen, polyethylene glycol  Antimicrobials: Anti-infectives (From admission, onward)    Start     Dose/Rate Route Frequency Ordered Stop   09/03/22 1315  vancomycin (VANCOCIN) capsule 125 mg        125 mg Oral 4 times daily 09/03/22 1301 09/13/22 1159        I have personally reviewed the following labs and images: CBC: Recent Labs  Lab 09/01/22 1547  09/02/22 1245 09/03/22 0058  WBC 9.5 8.7 11.6*  NEUTROABS 8.7* 8.0*  --   HGB 9.3* 9.2* 9.1*  HCT 27.5* 28.3* 27.5*  MCV 85.7 87.3 86.2  PLT 184.0 178 186   BMP &GFR Recent Labs  Lab 09/02/22 1245 09/02/22 1804 09/02/22 2040 09/03/22 0058 09/03/22 0825  NA 121* 123* 123* 122* 122*  K 3.2* 3.9 3.5 3.3* 3.5  CL 91* 91* 92* 91* 89*  CO2 19* 19* 17* 16* 20*  GLUCOSE 73 56* 55* 47* 36*  BUN 60* 60* 61* 60* 62*  CREATININE 6.42* 6.46* 6.29* 6.14* 6.28*  CALCIUM 7.5* 7.8* 7.6* 7.5* 7.5*  MG  --  1.9  --   --  1.9   Estimated Creatinine Clearance: 6.7 mL/min (A) (by C-G formula based on SCr of 6.28 mg/dL (H)). Liver & Pancreas: Recent Labs  Lab 09/01/22 1547 09/02/22 1245 09/03/22 0825  AST 33 30  --   ALT 10 12  --   ALKPHOS 409* 363*  --   BILITOT 0.8 1.0  --  PROT 7.1 6.4*  --   ALBUMIN 2.4* 1.8* 1.7*   No results for input(s): "LIPASE", "AMYLASE" in the last 168 hours. No results for input(s): "AMMONIA" in the last 168 hours. Diabetic: No results for input(s): "HGBA1C" in the last 72 hours. Recent Labs  Lab 09/03/22 0907 09/03/22 0939 09/03/22 0955 09/03/22 1122  GLUCAP 43* 71 73 102*   Cardiac Enzymes: No results for input(s): "CKTOTAL", "CKMB", "CKMBINDEX", "TROPONINI" in the last 168 hours. Recent Labs    02/17/22 1622  PROBNP 4,983*   Coagulation Profile: No results for input(s): "INR", "PROTIME" in the last 168 hours. Thyroid Function Tests: Recent Labs    09/02/22 1753  TSH 27.205*   Lipid Profile: No results for input(s): "CHOL", "HDL", "LDLCALC", "TRIG", "CHOLHDL", "LDLDIRECT" in the last 72 hours. Anemia Panel: Recent Labs    09/01/22 1547  IRON 30*   Urine analysis:    Component Value Date/Time   COLORURINE YELLOW 09/02/2022 2137   APPEARANCEUR CLOUDY (A) 09/02/2022 2137   LABSPEC 1.011 09/02/2022 2137   PHURINE 5.0 09/02/2022 2137   GLUCOSEU NEGATIVE 09/02/2022 2137   GLUCOSEU NEGATIVE 02/27/2022 1237   HGBUR MODERATE (A)  09/02/2022 2137   BILIRUBINUR NEGATIVE 09/02/2022 2137   KETONESUR NEGATIVE 09/02/2022 2137   PROTEINUR 30 (A) 09/02/2022 2137   UROBILINOGEN 0.2 02/27/2022 1237   NITRITE NEGATIVE 09/02/2022 2137   LEUKOCYTESUR LARGE (A) 09/02/2022 2137   Sepsis Labs: Invalid input(s): "PROCALCITONIN", "LACTICIDVEN"  Microbiology: Recent Results (from the past 240 hour(s))  Gastrointestinal Panel by PCR , Stool     Status: None   Collection Time: 09/02/22  4:00 PM   Specimen: Stool  Result Value Ref Range Status   Campylobacter species NOT DETECTED NOT DETECTED Final   Plesimonas shigelloides NOT DETECTED NOT DETECTED Final   Salmonella species NOT DETECTED NOT DETECTED Final   Yersinia enterocolitica NOT DETECTED NOT DETECTED Final   Vibrio species NOT DETECTED NOT DETECTED Final   Vibrio cholerae NOT DETECTED NOT DETECTED Final   Enteroaggregative E coli (EAEC) NOT DETECTED NOT DETECTED Final   Enteropathogenic E coli (EPEC) NOT DETECTED NOT DETECTED Final   Enterotoxigenic E coli (ETEC) NOT DETECTED NOT DETECTED Final   Shiga like toxin producing E coli (STEC) NOT DETECTED NOT DETECTED Final   Shigella/Enteroinvasive E coli (EIEC) NOT DETECTED NOT DETECTED Final   Cryptosporidium NOT DETECTED NOT DETECTED Final   Cyclospora cayetanensis NOT DETECTED NOT DETECTED Final   Entamoeba histolytica NOT DETECTED NOT DETECTED Final   Giardia lamblia NOT DETECTED NOT DETECTED Final   Adenovirus F40/41 NOT DETECTED NOT DETECTED Final   Astrovirus NOT DETECTED NOT DETECTED Final   Norovirus GI/GII NOT DETECTED NOT DETECTED Final   Rotavirus A NOT DETECTED NOT DETECTED Final   Sapovirus (I, II, IV, and V) NOT DETECTED NOT DETECTED Final    Comment: Performed at [facility], [address] [place], [place] [address]  C Difficile Quick Screen w PCR reflex     Status: Abnormal (Preliminary result)   Collection Time: 09/03/22 11:22 AM   Specimen: STOOL  Result Value Ref Range Status   C Diff  antigen POSITIVE (A) NEGATIVE Final   C Diff toxin POSITIVE (A) NEGATIVE Final    Comment: CRITICAL RESULT CALLED TO, READ BACK BY AND VERIFIED WITH:  [id number] RN Marshall Cork Performed at [facility], [place] [address] [place], [place] [address]    C Diff interpretation PENDING  Incomplete    Radiology Studies:  No results found.    Darcee Dekker T. [place]  If 7PM-7AM, please contact night-coverage [email or web address] 09/03/2022, 2:20 PM

## 2022-09-03 NOTE — Progress Notes (Addendum)
TRH night cross cover note:   I am following-up on serum sodium trend in this patient who is admitted yesterday for acute on chronic hyponatremia in the setting of hypervolemia, for which he was noted to receive Lasix 40 mg IV x 1 dose at 1820 on 09/02/2022.  Recent serum sodium trend noted to be as follows: 121 at 1245 on 09/02/2022, followed by 123 at 1800 on 09/02/2022, followed by 123 at 2040 on 09/02/2022, followed by 122 at 1 AM on 09/03/2022.  Will continue to monitor ensuing serum sodium trend via existing order for every 4 hours BMPs.  If next BMP, which should occur around 4 AM, demonstrates no improvement in sodium level, will consider ordering an additional dose of IV Lasix given the suspected hypervolemic etiology, as above.    Update: BP this morning noted to be 88/56 , associated with MAP 67 mmHg. This is down slightly from most recent prior BP of 94/57 around midnight. HR's in the 60's. For now, I've held orders for hydralazine, isosorbide, and added hold parameters for his beta-blocker.  Will continue to monitor for updated BMP, as above, and be conservative with potential additional iv diuresis, dependent upon trend of ensuing serum Na level.    Babs Bertin, DO Hospitalist

## 2022-09-03 NOTE — TOC Initial Note (Signed)
Transition of Care [facility]) - Initial/Assessment Note    Patient Details  Name: Rick [name] MRN: [id number] Date of Birth: 11-22-1928  Transition of [facility]) CM/SW Contact:    Zenon Mayo, RN Phone Number: 09/03/2022, 4:11 PM  Clinical Narrative:                 From home with spouse, he is active with [facility] for [facility], [place], [place], he would like to continue with [facility].  NCM confirmed with Cyndi.  Soc will begin 24 to 48 hrs post dc.  Family member will transport him home at dc.   Expected Discharge Plan: [place] Barriers to Discharge: Continued Medical Work up   Patient Goals and CMS Choice Patient states their goals for this hospitalization and ongoing recovery are:: return home CMS Medicare.gov Compare Post Acute Care list provided to:: Patient Choice offered to / list presented to : Patient      Expected Discharge Plan and Services   Discharge Planning Services: CM Consult Post Acute Care Choice: [place] arrangements for the past 2 months: Single Family Home                 DME Arranged: N/A DME Agency: NA       HH Arranged: RN, PT, OT HH Agency: [place] Date [facility] Agency Contacted: 09/03/22 Time HH Agency Contacted: 80 Representative spoke with at [place]: [place] Arrangements/Services Living arrangements for the past 2 months: [place] with:: Spouse Patient language and need for interpreter reviewed:: Yes Do you feel safe going back to the place where you live?: Yes      Need for Family Participation in Patient Care: Yes (Comment) Care giver support system in place?: Yes (comment)   Criminal Activity/Legal Involvement Pertinent to Current Situation/Hospitalization: No - Comment as needed  Activities of Daily Living      Permission Sought/Granted                  Emotional Assessment Appearance:: Appears stated age Attitude/Demeanor/Rapport: Gracious Affect  (typically observed): Appropriate Orientation: : Oriented to Self, Oriented to Place, Oriented to  Time, Oriented to Situation Alcohol / Substance Use: Not Applicable Psych Involvement: No (comment)  Admission diagnosis:  Hyponatremia [E87.1] Diarrhea, unspecified type [R19.7] Patient Active Problem List   Diagnosis Date Noted   Acute kidney injury superimposed on chronic kidney disease ([place]) 09/03/2022   Clostridioides difficile infection 09/03/2022   Goals of care, counseling/discussion 09/03/2022   Hypoglycemia 09/03/2022   Gastric AVM 08/14/2022   Hypothyroidism 07/30/2022   Volume overload 07/23/2022   DNR (do not resuscitate) discussion 06/05/2022   Hypoalbuminemia due to protein-calorie malnutrition ([place]) 06/01/2022   Acquired hypothyroidism 06/01/2022   BPH (benign prostatic hyperplasia) 06/01/2022   Hypokalemia 05/31/2022   Metacarpal bone fracture 11/06/2021   Multiple pelvic fractures ([place]) 10/25/2021   Multiple closed pelvic fractures without disruption of pelvic ring ([place]) 10/22/2021   Gastritis and gastroduodenitis    Gastric polyp    Upper GI bleed 05/02/2021   Anemia due to stage 4 chronic kidney disease ([place]) 05/02/2021   Hyponatremia 05/02/2021   Cystic lesion of abdominal viscera 12/11/2020   Severe mitral regurgitation 12/02/2020   Demand ischemia 12/02/2020   Aortic atherosclerosis ([place]) 12/01/2020   Hypertensive urgency 12/01/2020   Polycystic kidney disease 12/01/2020   Glaucoma 12/01/2020   Diarrhea 12/01/2020   Acute on chronic combined systolic and diastolic CHF (congestive  heart failure) ([place]) 11/30/2020   Shoulder pain 05/30/2020   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   Retinal hemorrhage, right eye 05/29/2020   Hypertensive retinopathy, right eye 05/29/2020   Cough 10/25/2017   Health care maintenance 10/22/2016   Anemia, iron deficiency 04/11/2015   Heme positive stool 04/11/2015   Advance care planning 01/16/2015   Benign  paroxysmal positional vertigo 01/16/2015   History of prostate cancer 02/02/2014   Bilateral renal cysts 01/31/2014   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   Family history of malignant neoplasm of gastrointestinal tract 03/04/2012   Medicare annual wellness visit, subsequent 01/23/2012   VENTRAL HERNIA, ASYMPTOMATIC 03/09/2008   TOBACCO ABUSE, HX OF [id number]   HELICOBACTER PYLORI INFECTION 11/25/2006   HYPERCHOLESTEROLEMIA 11/25/2006   Essential hypertension 11/25/2006   GERD without esophagitis 11/25/2006   Hyperglycemia 11/25/2006   PCP:  Tonia Ghent, MD Pharmacy:   [id number] - [place], [address] [address] Phone: [phone] Fax: [phone]  [name] [address] [place] [address] Phone: [phone] Fax: [phone]     Social Determinants of Health (SDOH) Social History: SDOH Screenings   Food Insecurity: No Food Insecurity (08/18/2022)  Housing: Low Risk  (08/18/2022)  Transportation Needs: No Transportation Needs (08/18/2022)  Alcohol Screen: Low Risk  (11/27/2021)  Depression (PHQ2-9): Low Risk  (09/01/2022)  Financial Resource Strain: Low Risk  (11/27/2021)  Physical Activity: Insufficiently Active (11/27/2021)  Social Connections: Socially Integrated (11/27/2021)  Stress: No Stress Concern Present (11/27/2021)  Tobacco Use: Medium Risk (09/02/2022)   SDOH Interventions:     Readmission Risk Interventions    09/03/2022    4:07 PM 07/25/2022    4:19 PM  Readmission Risk Prevention Plan  Transportation Screening Complete Complete  PCP or Specialist Appt within 3-5 Days  Complete  HRI or [place]  Not Complete  HRI or Home Care Consult comments  need pt eval  Social Work Consult for [place] Planning/Counseling  Not Complete  SW consult not completed comments  need pt eval  Palliative Care Screening  Not Applicable   Medication Review Press photographer) Complete Complete  PCP or Specialist appointment within 3-5 days of discharge Complete   HRI or [place] Complete   [place] Not Applicable

## 2022-09-03 NOTE — Progress Notes (Signed)
C.diff sample sent, Positive for c.diff toxins. MD notified

## 2022-09-03 NOTE — Telephone Encounter (Signed)
Patient Advocate Encounter  Prior Authorization for Vancomycin HCl 125MG  capsules has been approved.    PA# [id number] Insurance [employer] Electronic PA Form Effective dates: 09/03/2022 through 06/09/2023  Patients co-pay is $10.00.     Lyndel Safe, [place] Patient Advocate Specialist [place] Patient Advocate Team Direct Number: [phone]  Fax: [phone]

## 2022-09-04 DIAGNOSIS — Z7189 Other specified counseling: Secondary | ICD-10-CM | POA: Diagnosis not present

## 2022-09-04 DIAGNOSIS — N185 Chronic kidney disease, stage 5: Secondary | ICD-10-CM | POA: Diagnosis not present

## 2022-09-04 DIAGNOSIS — Q613 Polycystic kidney, unspecified: Secondary | ICD-10-CM | POA: Diagnosis not present

## 2022-09-04 DIAGNOSIS — N179 Acute kidney failure, unspecified: Secondary | ICD-10-CM | POA: Diagnosis not present

## 2022-09-04 DIAGNOSIS — A498 Other bacterial infections of unspecified site: Secondary | ICD-10-CM | POA: Diagnosis not present

## 2022-09-04 DIAGNOSIS — E871 Hypo-osmolality and hyponatremia: Secondary | ICD-10-CM | POA: Diagnosis not present

## 2022-09-04 DIAGNOSIS — Z515 Encounter for palliative care: Secondary | ICD-10-CM

## 2022-09-04 LAB — GLUCOSE, CAPILLARY
Glucose-Capillary: 126 mg/dL — ABNORMAL HIGH (ref 70–99)
Glucose-Capillary: 137 mg/dL — ABNORMAL HIGH (ref 70–99)
Glucose-Capillary: 141 mg/dL — ABNORMAL HIGH (ref 70–99)
Glucose-Capillary: 143 mg/dL — ABNORMAL HIGH (ref 70–99)

## 2022-09-04 LAB — CBC
HCT: 26.6 % — ABNORMAL LOW (ref 39.0–52.0)
Hemoglobin: 8.6 g/dL — ABNORMAL LOW (ref 13.0–17.0)
MCH: 28 pg (ref 26.0–34.0)
MCHC: 32.3 g/dL (ref 30.0–36.0)
MCV: 86.6 fL (ref 80.0–100.0)
Platelets: 196 10*3/uL (ref 150–400)
RBC: 3.07 MIL/uL — ABNORMAL LOW (ref 4.22–5.81)
RDW: 16.4 % — ABNORMAL HIGH (ref 11.5–15.5)
WBC: 12.3 10*3/uL — ABNORMAL HIGH (ref 4.0–10.5)
nRBC: 0 % (ref 0.0–0.2)

## 2022-09-04 LAB — COMPREHENSIVE METABOLIC PANEL
ALT: 12 U/L (ref 0–44)
AST: 29 U/L (ref 15–41)
Albumin: 1.6 g/dL — ABNORMAL LOW (ref 3.5–5.0)
Alkaline Phosphatase: 362 U/L — ABNORMAL HIGH (ref 38–126)
Anion gap: 13 (ref 5–15)
BUN: 60 mg/dL — ABNORMAL HIGH (ref 8–23)
CO2: 17 mmol/L — ABNORMAL LOW (ref 22–32)
Calcium: 7.4 mg/dL — ABNORMAL LOW (ref 8.9–10.3)
Chloride: 94 mmol/L — ABNORMAL LOW (ref 98–111)
Creatinine, Ser: 6.04 mg/dL — ABNORMAL HIGH (ref 0.61–1.24)
GFR, Estimated: 8 mL/min — ABNORMAL LOW (ref >60)
Glucose, Bld: 119 mg/dL — ABNORMAL HIGH (ref 70–99)
Potassium: 3.9 mmol/L (ref 3.5–5.1)
Sodium: 124 mmol/L — ABNORMAL LOW (ref 135–145)
Total Bilirubin: 0.7 mg/dL (ref 0.3–1.2)
Total Protein: 6.1 g/dL — ABNORMAL LOW (ref 6.5–8.1)

## 2022-09-04 LAB — ACTH STIMULATION, 3 TIME POINTS
Cortisol, 30 Min: 22.6 ug/dL
Cortisol, 60 Min: 32.8 ug/dL
Cortisol, Base: 12.7 ug/dL

## 2022-09-04 LAB — PHOSPHORUS: Phosphorus: 3.5 mg/dL (ref 2.5–4.6)

## 2022-09-04 LAB — MAGNESIUM: Magnesium: 1.9 mg/dL (ref 1.7–2.4)

## 2022-09-04 MED ORDER — ALBUMIN HUMAN 25 % IV SOLN
12.5000 g | Freq: Four times a day (QID) | INTRAVENOUS | Status: AC
  Administered 2022-09-04 (×2): 12.5 g via INTRAVENOUS
  Filled 2022-09-04 (×2): qty 50

## 2022-09-04 NOTE — Consult Note (Signed)
Consultation Note Date: 09/04/2022   Patient Name: Rick [name]  DOB: Aug 01, 1928  MRN: [id number]  Age / Sex: 89 y.o., male  PCP: Rick Ghent, MD Referring Physician: Mercy Riding, MD  Reason for Consultation: Establishing goals of care  HPI/Patient Profile: 89 y.o. male  with past medical history of CHF, MR, CKD-5, PCKD, hyponatremia, GIB, prostate cancer, BPH and hypertension admitted on 09/02/2022 with hyponatremia.  Patient found to have creatinine of 6.42 (baseline 5.5-5.7).  Patient also found to be C. difficile positive and started on vancomycin.  Patient also found to have TSH of 27.2.  PMT consulted to discuss goals of care.  Clinical Assessment and Goals of Care: I have reviewed medical records including EPIC notes, labs and imaging, assessed the patient and then met with patient and his daughter Rick [name] to discuss diagnosis prognosis, [place], EOL wishes, disposition and options.  I introduced Palliative Medicine as specialized medical care for people living with serious illness. It focuses on providing relief from the symptoms and stress of a serious illness. The goal is to improve quality of life for both the patient and the family.  Daughter shares that patient ambulates with walker at baseline.  She shares of ongoing poor appetite.  She tells me he is very interactive with others at baseline.   We discussed patient's current illness and what it means in the larger context of patient's on-going co-morbidities.  We discussed his C. difficile infection as well as AKI.  We also discussed his elevated TSH.  I attempted to elicit values and goals of care important to the patient.    We did discuss CODE STATUS as well.  We reviewed conversation she had with Dr. Cyndia [name] yesterday regarding CPR.  We discussed evidenced based poor outcomes in similar hospitalized patients, as the cause of the arrest is likely associated with chronic/terminal  disease rather than a reversible acute cardio-pulmonary event.   Discussed with daughter the importance of continued conversation with family and the medical providers regarding overall plan of care and treatment options, ensuring decisions are within the context of the patients values and GOCs.    Questions and concerns were addressed. The family was encouraged to call with questions or concerns.  Primary Decision Maker PATIENT? -Capacity unclear at this point Next of kin his wife    SUMMARY OF RECOMMENDATIONS   Ongoing CODE STATUS conversations, plan to meet tomorrow a.m. discussed again to include wife and daughter  Discharge Planning: Home with Home Health      Primary Diagnoses: Present on Admission:  Hypothyroidism  HYPERCHOLESTEROLEMIA  Glaucoma  GERD without esophagitis  Essential hypertension  (Resolved) Chronic kidney disease  BPH (benign prostatic hyperplasia)  Anemia due to stage 4 chronic kidney disease (HCC)  Hyponatremia  (Resolved) AKI (acute kidney injury) ([place])   I have reviewed the medical record, interviewed the patient and family, and examined the patient. The following aspects are pertinent.  Past Medical History:  Diagnosis Date   Acute hypoxemic respiratory failure ([place]) [id number]   Acute metabolic encephalopathy [id number]   Acute on chronic heart failure with preserved ejection fraction (HFpEF) ([place]) 05/02/2021   Anemia 01/16/2015   Arthritis    CHF (congestive heart failure) (HCC)    GERD (gastroesophageal reflux disease)    History of colon polyps    History of prostate cancer    Hyperlipidemia    Hypertension    Kidney cysts    bilateral.  Renal US   Mitral regurgitation 12/02/2020  Social History   Socioeconomic History   Marital status: Married    Spouse name: Not on file   Number of children: 6   Years of education: Not on file   Highest education level: Not on file  Occupational History   Occupation: retired- Writer, Biochemist, clinical    Comment: at [employer], Sport and exercise psychologist: RETIRED  Tobacco Use   Smoking status: Former    Types: Cigarettes    Quit date: 08/26/2001    Years since quitting: 21.0   Smokeless tobacco: Former    Types: Chew   Tobacco comments:    quit about 10 years or more  Vaping Use   Vaping Use: Never used  Substance and Sexual Activity   Alcohol use: Yes   Drug use: No   Sexual activity: Not Currently  Other Topics Concern   Not on file  Social History Narrative   Retired Psychologist, sport and exercise, worked at [employer], [place]   Married 1955   6 kids initially, oldest daughter died after surgery   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, 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   Social Determinants of Health   Financial Resource Strain: [place]  (11/27/2021)   Overall Financial Resource Strain (CARDIA)    Difficulty of Paying Living Expenses: Not hard at all  Food Insecurity: No [place] (09/03/2022)   Hunger Vital Sign    Worried About Running Out of Food in the Last Year: Never true    [place] in the Last Year: Never true  Transportation Needs: No Transportation Needs (09/03/2022)   PRAPARE - Hydrologist (Medical): No    Lack of Transportation (Non-Medical): No  Physical Activity: Insufficiently Active (11/27/2021)   Exercise Vital Sign    Days of Exercise per Week: 7 days    Minutes of Exercise per Session: 10 min  Stress: No Stress Concern Present (11/27/2021)   [place]    Feeling of Stress : Not at all  Social Connections: [place] (11/27/2021)   Social Connection and Isolation Panel [NHANES]    Frequency of Communication with Friends and Family: More than three times a week    Frequency of Social Gatherings with Friends and Family: More than three times a week    Attends Religious Services: More than 4 times per year    Active Member of [employer] or Organizations: Yes     Attends Music therapist: More than 4 times per year    Marital Status: Married   Family History  Problem Relation Age of Onset   Bone cancer Sister    Stomach cancer Brother    Stomach cancer Brother    Lung cancer Sister    Colon cancer Sister    Stroke Mother    Colon cancer Other        nephew   Scheduled Meds:  atorvastatin  10 mg Oral Daily   heparin  5,000 Units Subcutaneous Q8H   levothyroxine  75 mcg Oral Q0600   metoprolol succinate  12.5 mg Oral Daily   pantoprazole  40 mg Oral BID AC   sodium chloride flush  3 mL Intravenous Q12H   tamsulosin  0.4 mg Oral Daily   timolol  1 drop Both Eyes q morning   vancomycin  125 mg Oral QID   Continuous Infusions: PRN Meds:.acetaminophen **OR** acetaminophen, polyethylene glycol Allergies  Allergen Reactions   Lisinopril  cough   Nsaids     Elevated Cr   Review of Systems  Constitutional:  Positive for appetite change.  Neurological:  Positive for weakness.    Physical Exam Constitutional:      General: He is not in acute distress.    Appearance: He is ill-appearing.  Pulmonary:     Effort: Pulmonary effort is normal.  Skin:    General: Skin is warm and dry.  Neurological:     Mental Status: He is alert.     Comments: Unclear as he fell asleep during conversation.  He makes a few comments that seem appropriate     Vital Signs: BP 123/74 (BP Location: Right Arm)   Pulse 78   Temp (!) 97.5 F (36.4 C) (Oral)   Resp 18   Ht 5\' 9"  (1.753 m)   Wt 62.1 kg   SpO2 99%   BMI 20.22 kg/m  Pain Scale: 0-10   Pain Score: 0-No pain   SpO2: SpO2: 99 % O2 Device:SpO2: 99 % O2 Flow Rate: .   IO: Intake/output summary:  Intake/Output Summary (Last 24 hours) at 09/04/2022 1509 Last data filed at 09/04/2022 0159 Gross per 24 hour  Intake --  Output 600 ml  Net -600 ml    LBM: Last BM Date : 09/04/22 Baseline Weight: Weight: 63.6 kg Most recent weight: Weight: 62.1 kg     Palliative  Assessment/Data: PPS 40%     *Please note that this is a verbal dictation therefore any spelling or grammatical errors are due to the "[employer]" system interpretation.   Juel Burrow, DNP, AGNP-C Palliative Medicine Team [phone] Pager: [phone]

## 2022-09-04 NOTE — Evaluation (Signed)
Occupational Therapy Evaluation Patient Details Name: Rick [name] MRN: [id number] DOB: [birth year] Today's Date: 09/04/2022   History of Present Illness 89 year old M adm 3/26 with PMH of combined CHF, MR, CKD-5, PCKD, hyponatremia, GIB, prostate cancer, BPH and hypertension directed to ED by PCP for hyponatremia.  C-diff +   Clinical Impression   Patient admitted for the diagnosis above.  Of note, he has had previous admits for hyponatremia.  At home he has the needed assist for ADL completion, medication management, community mobility and iADL from family.  Patient stating he uses Rw for mobility in the home.  Currently he may be very close to his ADL baseline, with weakness being his primary deficit.  OT is indicated in the acute setting to maximize his independence, and assist with transition home when cleared medically.  PT Consult is pending.       Recommendations for follow up therapy are one component of a multi-disciplinary discharge planning process, led by the attending physician.  Recommendations may be updated based on patient status, additional functional criteria and insurance authorization.   Assistance Recommended at Discharge Frequent or constant Supervision/Assistance  Patient can return home with the following Help with stairs or ramp for entrance;Assist for transportation;Assistance with cooking/housework;Direct supervision/assist for financial management;Direct supervision/assist for medications management;A little help with walking and/or transfers;A lot of help with bathing/dressing/bathroom    Functional Status Assessment  Patient has had a recent decline in their functional status and demonstrates the ability to make significant improvements in function in a reasonable and predictable amount of time.  Equipment Recommendations  None recommended by OT    Recommendations for Other Services       Precautions / Restrictions Precautions Precaution Comments:  incontinent of urine/stool in standing.  C-diff + Restrictions Weight Bearing Restrictions: No      Mobility Bed Mobility Overal bed mobility: Needs Assistance Bed Mobility: Supine to Sit     Supine to sit: Min assist, Mod assist          Transfers Overall transfer level: Needs assistance Equipment used: Rolling walker (2 wheels) Transfers: Sit to/from Stand, Bed to chair/wheelchair/BSC Sit to Stand: Min assist, Mod assist     Step pivot transfers: Min assist            Balance Overall balance assessment: Needs assistance Sitting-balance support: Feet supported Sitting balance-Leahy Scale: Fair     Standing balance support: Bilateral upper extremity supported, During functional activity Standing balance-Leahy Scale: Poor                             ADL either performed or assessed with clinical judgement   ADL       Grooming: Wash/dry hands;Wash/dry face;Min guard;Sitting   Upper Body Bathing: Minimal assistance;Sitting       Upper Body Dressing : Minimal assistance;Sitting   Lower Body Dressing: Maximal assistance;Sitting/lateral leans   Toilet Transfer: Moderate assistance;Stand-pivot;BSC/3in1   Toileting- Clothing Manipulation and Hygiene: Maximal assistance;Sit to/from stand               Vision Patient Visual Report: No change from baseline       Perception     Praxis      Pertinent Vitals/Pain Pain Assessment Pain Assessment: No/denies pain Pain Intervention(s): Monitored during session     Hand Dominance Right   Extremity/Trunk Assessment Upper Extremity Assessment Upper Extremity Assessment: Generalized weakness   Lower Extremity Assessment Lower Extremity Assessment: Defer to  PT evaluation   Cervical / Trunk Assessment Cervical / Trunk Assessment: Kyphotic   Communication Communication Communication: HOH   Cognition Arousal/Alertness: Awake/alert Behavior During Therapy: Flat affect Overall Cognitive  Status: Impaired/Different from baseline Area of Impairment: Following commands, Safety/judgement, Awareness, Problem solving, Memory                     Memory: Decreased recall of precautions, Decreased short-term memory Following Commands: Follows one step commands consistently, Follows one step commands with increased time Safety/Judgement: Decreased awareness of safety Awareness: Emergent Problem Solving: Slow processing, Requires verbal cues General Comments: pt pleasant, HOH, and needing multiple cues to safely manage with lines. unable to understand external catheter and rectal pouch.     General Comments   VSS on RA    Exercises     Shoulder Instructions      Home Living Family/patient expects to be discharged to:: Private residence Living Arrangements: Spouse/significant other Available Help at Discharge: Family;Available 24 hours/day Type of Home: House Home Access: Stairs to enter [employer] of Steps: 2 Entrance Stairs-Rails: Right Home Layout: One level     Bathroom Shower/Tub: Teacher, early years/pre: Handicapped height Bathroom Accessibility: Yes How Accessible: Accessible via walker Home Equipment: Cane - single point;Rollator (4 wheels);Rolling Walker (2 wheels);Toilet riser;BSC/3in1          Prior Functioning/Environment               Mobility Comments: uses rollator for mobility ADLs Comments: family involved and supportive to help his wife with ADL and iADL.  Wife manages medications.  Family assists with community mobility.        OT Problem List: Decreased strength;Decreased activity tolerance;Impaired balance (sitting and/or standing)      OT Treatment/Interventions: Self-care/ADL training;Therapeutic activities;DME and/or AE instruction;Patient/family education;Balance training    OT Goals(Current goals can be found in the care plan section) Acute Rehab OT Goals Patient Stated Goal: Return home OT Goal  Formulation: With patient Time For Goal Achievement: 09/18/22 Potential to Achieve Goals: Good ADL Goals Pt Will Perform Grooming: standing;with supervision Pt Will Perform Upper Body Bathing: with supervision;sitting Pt Will Perform Upper Body Dressing: with supervision;sitting Pt Will Perform Lower Body Dressing: sit to/from stand;with mod assist Pt Will Transfer to Toilet: with supervision;ambulating;regular height toilet  OT Frequency: Min 2X/week    Co-evaluation              AM-PAC OT "6 Clicks" Daily Activity     Outcome Measure Help from another person eating meals?: None Help from another person taking care of personal grooming?: A Little Help from another person toileting, which includes using toliet, bedpan, or urinal?: A Lot Help from another person bathing (including washing, rinsing, drying)?: A Lot Help from another person to put on and taking off regular upper body clothing?: A Little Help from another person to put on and taking off regular lower body clothing?: A Lot 6 Click Score: 16   End of Session Equipment Utilized During Treatment: Rolling walker (2 wheels) Nurse Communication: Mobility status  Activity Tolerance: Patient tolerated treatment well Patient left: in chair;with call bell/phone within reach;with chair alarm set  OT Visit Diagnosis: Unsteadiness on feet (R26.81);Muscle weakness (generalized) (M62.81);History of falling (Z91.81)                Time: [id number] OT Time Calculation (min): 19 min Charges:  OT General Charges $OT Visit: 1 Visit OT Evaluation $OT Eval Moderate Complexity: 1 Mod  09/04/2022  RP, OTR/L  Acute Rehabilitation Services  Office:  [phone]   Metta Clines 09/04/2022, 4:42 PM

## 2022-09-04 NOTE — Consult Note (Signed)
   [facility] Inpatient Consult   09/04/2022  Rick [name] [birth year] [id number]  [place] Organization [ACO] Patient: [name] Medicare  Primary Care Provider:  Tonia Ghent, MD with [place] at [facility] which is listed to provide the post   Patient screened for noted less than 30 days readmission hospitalization with noted extreme high risk score for unplanned readmission risk and to assess for potential [place] Management service needs for post hospital transition for care coordination.  Review of patient's electronic medical record reveals patient was recently referred for [place] and encounters reveal that the team has been unable to gain contact.  Met with daughter, Rick [name] at the bedside regarding attempts to reach by [facility] prior to admission.  Explained the reason for the referral and post hospital follow up.  She was given an appointment reminder card and a 24 hour nurse advise line.  Explained that this writer will follow up and can refer patient again for post hospital follow up pending disposition as unknown at this time.    Plan:  Continue to follow progress and disposition to assess for post hospital community care coordination/management needs.  Referral request for community care coordination: pending disposition  Of note, [place] does not replace or interfere with any arrangements made by the Inpatient Transition of Care team.  For questions contact:   Natividad Brood, RN BSN [place]  [phone] business mobile phone Toll free office [phone]  *[place]  [phone] Fax number: [phone] [name]

## 2022-09-04 NOTE — Progress Notes (Signed)
PROGRESS NOTE  Rick [name] [id number] DOB: 10/30/1928   PCP: Tonia Ghent, MD  Patient is from: Home  DOA: 09/02/2022 LOS: 1  Chief complaints Chief Complaint  Patient presents with   hyponatremia     Brief Narrative / Interim history: 89 year old M with PMH of combined CHF, MR, CKD-5, PCKD, hyponatremia, GIB, prostate cancer, BPH and hypertension directed to ED by PCP for hyponatremia.  See in by PCP for generalized weakness, diarrhea and shortness of breath.  Lab work significant for hyponatremia to 190 and AKI.    In ED, stable vitals. Na 121. Cr 6.42 (baseline 5.8).  BUN 60.  K3.2.  GIB ordered.  Hyponatremia felt to be hypervolemic.  Started on IV Lasix and fluid restriction.   The next day, C. difficile panel positive.  Started on p.o. vancomycin.  Slowly improving.   Subjective: Seen and examined earlier this morning.  No major events overnight of this morning.  No complaints but not a great historian.  Still with ongoing diarrhea but improved.  Denies chest pain, dyspnea, nausea, vomiting or abdominal pain.  Denies UTI symptoms.  Hyponatremia and AKI improved.  Patient's daughter at bedside.  Objective: Vitals:   09/03/22 1948 09/04/22 0145 09/04/22 0536 09/04/22 0840  BP: 120/84 116/76 113/72 123/74  Pulse: 67 73 75 78  Resp: 19 19 19 18   Temp: (!) 97.4 F (36.3 C) 97.7 F (36.5 C) (!) 97.5 F (36.4 C) (!) 97.5 F (36.4 C)  TempSrc: Oral Oral Oral Oral  SpO2:  98% 99% 99%  Weight:  62.1 kg    Height:        Examination:  GENERAL: Appears frail.  No apparent distress. HEENT: MMM.  Vision and hearing grossly intact.  NECK: Supple.  No apparent JVD.  RESP:  No IWOB.  Fair aeration bilaterally. CVS:  RRR. Heart sounds normal.  ABD/GI/GU: BS+. Abd soft, NTND.  Rectal tube in place. MSK/EXT:   No apparent deformity. Moves extremities.  1+ pedal edema bilaterally. SKIN: no apparent skin lesion or wound NEURO: Awake and alert. Oriented appropriately.   No apparent focal neuro deficit. PSYCH: Calm. Normal affect.    Procedures:  None  Microbiology summarized: GIP negative C. difficile positive  Assessment and plan: Principal Problem:   Hyponatremia Active Problems:   Polycystic kidney disease   Glaucoma   Essential hypertension   Hypothyroidism   HYPERCHOLESTEROLEMIA   GERD without esophagitis   History of prostate cancer   Anemia due to stage 4 chronic kidney disease (HCC)   BPH (benign prostatic hyperplasia)   Acute kidney injury superimposed on chronic kidney disease (HCC)   Clostridioides difficile infection   Goals of care, counseling/discussion   Hypoglycemia   C. difficile diarrhea: C. difficile testing positive.  Ongoing diarrhea.  Has mild leukocytosis. -Continue p.o. vancomycin 125 mg 4 times daily for 10 days -Continue enteric precaution  AKI on CKD-5/history of PCKD: Baseline Cr 5.5-5.7.  AKI likely due to GI loss.  He has lower extremity edema but intravascularly dry.  Not a candidate for dialysis. Recent Labs    08/15/22 0434 08/16/22 0459 08/21/22 1317 09/01/22 1547 09/02/22 1245 09/02/22 1804 09/02/22 2040 09/03/22 0058 09/03/22 0825 09/04/22 0631  BUN 86* 77* 66* 57* 60* 60* 61* 60* 62* 60*  CREATININE 5.75* 5.49* 5.75* 6.11* 6.42* 6.46* 6.29* 6.14* 6.28* 6.04*  -IV albumin  Hyponatremia: Has some BLE edema but intravascularly dry.  His TSH is very high.  Cortisol level within normal. -Increased home  Synthroid -Follow ACTH level   Chronic combined systolic and diastolic CHF: Doubt acute exacerbation.  BNP elevation likely chronic and from renal failure. -Continue holding diuretics -Discontinue IV fluid -Closely monitor respiratory and fluid status, renal functions and electrolytes   Hypothyroidism: TSH elevated to 27.2. -Increase home Synthroid from 50 to 75 mcg -Recheck TSH in 4 to 6 weeks outpatient  Hypoglycemia: Likely due to diarrhea and poor p.o. intake. -Evaluate for adrenal  insufficiency -Continue D5 with NS   GERD -Continue home PPI   Hypertension: Soft blood pressures. -Continue low-dose Toprol -Continue holding BiDil   BPH -Continue home tamsulosin   Anemia of allergies: Stable. -Continue monitoring  Goal of care counseling: Remains full code. -See discussion on 3/27 -Palliative medicine following.  Plan for family meeting on 3/29  Hypoalbuminemia -Optimize nutrition. -IV albumin   Body mass index is 20.22 kg/m.          DVT prophylaxis:  heparin injection 5,000 Units Start: 09/02/22 2200  Code Status: Full code Family Communication: Updated patient's daughter at bedside Level of care: Progressive Status is: Inpatient Remains inpatient appropriate because: AKI, hyponatremia, hypoglycemia and C. difficile infection   Final disposition: TBD Consultants:  Palliative medicine  55 minutes with more than 50% spent in reviewing records, counseling patient/family and coordinating care.   Sch Meds:  Scheduled Meds:  atorvastatin  10 mg Oral Daily   heparin  5,000 Units Subcutaneous Q8H   levothyroxine  75 mcg Oral Q0600   metoprolol succinate  12.5 mg Oral Daily   pantoprazole  40 mg Oral BID AC   sodium chloride flush  3 mL Intravenous Q12H   tamsulosin  0.4 mg Oral Daily   timolol  1 drop Both Eyes q morning   vancomycin  125 mg Oral QID   Continuous Infusions:   PRN Meds:.acetaminophen **OR** acetaminophen, polyethylene glycol  Antimicrobials: Anti-infectives (From admission, onward)    Start     Dose/Rate Route Frequency Ordered Stop   09/03/22 1315  vancomycin (VANCOCIN) capsule 125 mg        125 mg Oral 4 times daily 09/03/22 1301 09/13/22 1159        I have personally reviewed the following labs and images: CBC: Recent Labs  Lab 09/01/22 1547 09/02/22 1245 09/03/22 0058 09/04/22 0631  WBC 9.5 8.7 11.6* 12.3*  NEUTROABS 8.7* 8.0*  --   --   HGB 9.3* 9.2* 9.1* 8.6*  HCT 27.5* 28.3* 27.5* 26.6*  MCV  85.7 87.3 86.2 86.6  PLT 184.0 178 186 196   BMP &GFR Recent Labs  Lab 09/02/22 1804 09/02/22 2040 09/03/22 0058 09/03/22 0825 09/04/22 0631  NA 123* 123* 122* 122* 124*  K 3.9 3.5 3.3* 3.5 3.9  CL 91* 92* 91* 89* 94*  CO2 19* 17* 16* 20* 17*  GLUCOSE 56* 55* 47* 36* 119*  BUN 60* 61* 60* 62* 60*  CREATININE 6.46* 6.29* 6.14* 6.28* 6.04*  CALCIUM 7.8* 7.6* 7.5* 7.5* 7.4*  MG 1.9  --   --  1.9 1.9  PHOS  --   --   --   --  3.5   Estimated Creatinine Clearance: 7 mL/min (A) (by C-G formula based on SCr of 6.04 mg/dL (H)). Liver & Pancreas: Recent Labs  Lab 09/01/22 1547 09/02/22 1245 09/03/22 0825 09/04/22 0631  AST 33 30  --  29  ALT 10 12  --  12  ALKPHOS 409* 363*  --  362*  BILITOT 0.8 1.0  --  0.7  PROT 7.1 6.4*  --  6.1*  ALBUMIN 2.4* 1.8* 1.7* 1.6*   No results for input(s): "LIPASE", "AMYLASE" in the last 168 hours. No results for input(s): "AMMONIA" in the last 168 hours. Diabetic: No results for input(s): "HGBA1C" in the last 72 hours. Recent Labs  Lab 09/03/22 1122 09/03/22 1554 09/03/22 2119 09/04/22 0615 09/04/22 1212  GLUCAP 102* 109* 127* 126* 137*   Cardiac Enzymes: No results for input(s): "CKTOTAL", "CKMB", "CKMBINDEX", "TROPONINI" in the last 168 hours. Recent Labs    02/17/22 1622  PROBNP 4,983*   Coagulation Profile: No results for input(s): "INR", "PROTIME" in the last 168 hours. Thyroid Function Tests: Recent Labs    09/02/22 1753  TSH 27.205*   Lipid Profile: No results for input(s): "CHOL", "HDL", "LDLCALC", "TRIG", "CHOLHDL", "LDLDIRECT" in the last 72 hours. Anemia Panel: Recent Labs    09/01/22 1547  IRON 30*   Urine analysis:    Component Value Date/Time   COLORURINE YELLOW 09/02/2022 2137   APPEARANCEUR CLOUDY (A) 09/02/2022 2137   LABSPEC 1.011 09/02/2022 2137   PHURINE 5.0 09/02/2022 2137   GLUCOSEU NEGATIVE 09/02/2022 2137   GLUCOSEU NEGATIVE 02/27/2022 1237   HGBUR MODERATE (A) 09/02/2022 2137    BILIRUBINUR NEGATIVE 09/02/2022 2137   KETONESUR NEGATIVE 09/02/2022 2137   PROTEINUR 30 (A) 09/02/2022 2137   UROBILINOGEN 0.2 02/27/2022 1237   NITRITE NEGATIVE 09/02/2022 2137   LEUKOCYTESUR LARGE (A) 09/02/2022 2137   Sepsis Labs: Invalid input(s): "PROCALCITONIN", "LACTICIDVEN"  Microbiology: Recent Results (from the past 240 hour(s))  Gastrointestinal Panel by PCR , Stool     Status: None   Collection Time: 09/02/22  4:00 PM   Specimen: Stool  Result Value Ref Range Status   Campylobacter species NOT DETECTED NOT DETECTED Final   Plesimonas shigelloides NOT DETECTED NOT DETECTED Final   Salmonella species NOT DETECTED NOT DETECTED Final   Yersinia enterocolitica NOT DETECTED NOT DETECTED Final   Vibrio species NOT DETECTED NOT DETECTED Final   Vibrio cholerae NOT DETECTED NOT DETECTED Final   Enteroaggregative E coli (EAEC) NOT DETECTED NOT DETECTED Final   Enteropathogenic E coli (EPEC) NOT DETECTED NOT DETECTED Final   Enterotoxigenic E coli (ETEC) NOT DETECTED NOT DETECTED Final   Shiga like toxin producing E coli (STEC) NOT DETECTED NOT DETECTED Final   Shigella/Enteroinvasive E coli (EIEC) NOT DETECTED NOT DETECTED Final   Cryptosporidium NOT DETECTED NOT DETECTED Final   Cyclospora cayetanensis NOT DETECTED NOT DETECTED Final   Entamoeba histolytica NOT DETECTED NOT DETECTED Final   Giardia lamblia NOT DETECTED NOT DETECTED Final   Adenovirus F40/41 NOT DETECTED NOT DETECTED Final   Astrovirus NOT DETECTED NOT DETECTED Final   Norovirus GI/GII NOT DETECTED NOT DETECTED Final   Rotavirus A NOT DETECTED NOT DETECTED Final   Sapovirus (I, II, IV, and V) NOT DETECTED NOT DETECTED Final    Comment: Performed at [facility], [address] [place], [place] [address]  C Difficile Quick Screen w PCR reflex     Status: Abnormal   Collection Time: 09/03/22 11:22 AM   Specimen: STOOL  Result Value Ref Range Status   C Diff antigen POSITIVE (A) NEGATIVE Final   C  Diff toxin POSITIVE (A) NEGATIVE Final    Comment: CRITICAL RESULT CALLED TO, READ BACK BY AND VERIFIED WITH:  [id number] RN CAMERON LEWIS    C Diff interpretation Toxin producing C. difficile detected.  Final    Comment: Performed at [facility]  Lab, [address], [place], [place] [address]    Radiology Studies: No results found.    Jamison Soward T. Shasta  If 7PM-7AM, please contact night-coverage [email or web address] 09/04/2022, 1:26 PM

## 2022-09-05 DIAGNOSIS — N185 Chronic kidney disease, stage 5: Secondary | ICD-10-CM | POA: Diagnosis not present

## 2022-09-05 DIAGNOSIS — Q613 Polycystic kidney, unspecified: Secondary | ICD-10-CM | POA: Diagnosis not present

## 2022-09-05 DIAGNOSIS — N179 Acute kidney failure, unspecified: Secondary | ICD-10-CM | POA: Diagnosis not present

## 2022-09-05 DIAGNOSIS — E871 Hypo-osmolality and hyponatremia: Secondary | ICD-10-CM | POA: Diagnosis not present

## 2022-09-05 LAB — CBC
HCT: 27.2 % — ABNORMAL LOW (ref 39.0–52.0)
Hemoglobin: 8.8 g/dL — ABNORMAL LOW (ref 13.0–17.0)
MCH: 29 pg (ref 26.0–34.0)
MCHC: 32.4 g/dL (ref 30.0–36.0)
MCV: 89.8 fL (ref 80.0–100.0)
Platelets: 192 10*3/uL (ref 150–400)
RBC: 3.03 MIL/uL — ABNORMAL LOW (ref 4.22–5.81)
RDW: 16.8 % — ABNORMAL HIGH (ref 11.5–15.5)
WBC: 10.6 10*3/uL — ABNORMAL HIGH (ref 4.0–10.5)
nRBC: 0 % (ref 0.0–0.2)

## 2022-09-05 LAB — GLUCOSE, CAPILLARY
Glucose-Capillary: 86 mg/dL (ref 70–99)
Glucose-Capillary: 124 mg/dL — ABNORMAL HIGH (ref 70–99)
Glucose-Capillary: 126 mg/dL — ABNORMAL HIGH (ref 70–99)
Glucose-Capillary: 131 mg/dL — ABNORMAL HIGH (ref 70–99)

## 2022-09-05 LAB — RENAL FUNCTION PANEL
Albumin: 1.9 g/dL — ABNORMAL LOW (ref 3.5–5.0)
Anion gap: 10 (ref 5–15)
BUN: 55 mg/dL — ABNORMAL HIGH (ref 8–23)
CO2: 16 mmol/L — ABNORMAL LOW (ref 22–32)
Calcium: 7.7 mg/dL — ABNORMAL LOW (ref 8.9–10.3)
Chloride: 95 mmol/L — ABNORMAL LOW (ref 98–111)
Creatinine, Ser: 5.84 mg/dL — ABNORMAL HIGH (ref 0.61–1.24)
GFR, Estimated: 9 mL/min — ABNORMAL LOW (ref >60)
Glucose, Bld: 130 mg/dL — ABNORMAL HIGH (ref 70–99)
Phosphorus: 3.2 mg/dL (ref 2.5–4.6)
Potassium: 3.8 mmol/L (ref 3.5–5.1)
Sodium: 121 mmol/L — ABNORMAL LOW (ref 135–145)

## 2022-09-05 LAB — ACTH: C206 ACTH: 26.8 pg/mL (ref 7.2–63.3)

## 2022-09-05 LAB — MAGNESIUM: Magnesium: 1.9 mg/dL (ref 1.7–2.4)

## 2022-09-05 MED ORDER — SODIUM CHLORIDE 0.9 % IV SOLN: INTRAVENOUS | Status: DC

## 2022-09-05 MED ORDER — SODIUM BICARBONATE 650 MG PO TABS
650.0000 mg | ORAL_TABLET | Freq: Three times a day (TID) | ORAL | Status: DC
  Administered 2022-09-05 – 2022-09-08 (×10): 650 mg via ORAL
  Filled 2022-09-05 (×10): qty 1

## 2022-09-05 NOTE — Care Management Important Message (Signed)
Important Message  Patient Details  Name: TYLERE LINA MRN: [id number] Date of Birth: December 20, 1928   Medicare Important Message Given:  Yes     [name] 09/05/2022, 9:18 AM

## 2022-09-05 NOTE — Evaluation (Signed)
Physical Therapy Evaluation Patient Details Name: Rick [name] MRN: [id number] DOB: [birth year] Today's Date: 09/05/2022  History of Present Illness  89 year old M adm 3/26 with hyponatremia and +cdiff. PMH of combined CHF, MR, CKD-5, PCKD, hyponatremia, GIB, prostate cancer, BPH and htn.  Clinical Impression  Pt admitted with above diagnosis and presents to PT with functional limitations due to deficits listed below (See PT problem list). Pt needs skilled PT to maximize independence and safety. Currently unable to attempt true amb due to pt with diarrhea. Expect as medical status improves pt's mobility will improve and appears his family is available to assist him at home.          Recommendations for follow up therapy are one component of a multi-disciplinary discharge planning process, led by the attending physician.  Recommendations may be updated based on patient status, additional functional criteria and insurance authorization.  Follow Up Recommendations       Assistance Recommended at Discharge Frequent or constant Supervision/Assistance  Patient can return home with the following  A little help with bathing/dressing/bathroom;A little help with walking and/or transfers;Direct supervision/assist for financial management;Direct supervision/assist for medications management;Assist for transportation;Help with stairs or ramp for entrance    Equipment Recommendations None recommended by PT  Recommendations for Other Services       Functional Status Assessment Patient has had a recent decline in their functional status and demonstrates the ability to make significant improvements in function in a reasonable and predictable amount of time.     Precautions / Restrictions Precautions Precautions: Fall Precaution Comments: incontinent of urine/stool in standing.  C-diff +      Mobility  Bed Mobility Overal bed mobility: Needs Assistance Bed Mobility: Supine to Sit, Sit to  Supine     Supine to sit: Min guard, HOB elevated Sit to supine: Min guard   General bed mobility comments: Incr time to come to EOB but no physical assist.    Transfers Overall transfer level: Needs assistance Equipment used: Rollator (4 wheels) Transfers: Sit to/from Stand, Bed to chair/wheelchair/BSC Sit to Stand: Min guard   Step pivot transfers: Min guard       General transfer comment: Assist for safety and lines    Ambulation/Gait             Pre-gait activities: 3' up side of bed returning from bsc General Gait Details: Unable due to diarrhea  Stairs            Wheelchair Mobility    Modified Rankin (Stroke Patients Only)       Balance Overall balance assessment: Needs assistance Sitting-balance support: No upper extremity supported, Feet supported Sitting balance-Leahy Scale: Fair     Standing balance support: Bilateral upper extremity supported, Reliant on assistive device for balance Standing balance-Leahy Scale: Poor Standing balance comment: UE support and min guard for static standing                             Pertinent Vitals/Pain Pain Assessment Pain Assessment: No/denies pain    Home Living Family/patient expects to be discharged to:: Private residence Living Arrangements: Spouse/significant other Available Help at Discharge: Family;Available 24 hours/day Type of Home: House Home Access: Stairs to enter Entrance Stairs-Rails: Right Entrance Stairs-Number of Steps: 2   Home Layout: One level Home Equipment: Cane - single point;Rollator (4 wheels);Rolling Walker (2 wheels);Toilet riser;BSC/3in1      Prior Function Prior Level of Function : Independent/Modified Independent  Mobility Comments: uses rollator for mobility ADLs Comments: family involved and supportive to help his wife with ADL and iADL.  Wife manages medications.  Family assists with community mobility.     Hand Dominance    Dominant Hand: Right    Extremity/Trunk Assessment   Upper Extremity Assessment Upper Extremity Assessment: Defer to OT evaluation    Lower Extremity Assessment Lower Extremity Assessment: Generalized weakness    Cervical / Trunk Assessment Cervical / Trunk Assessment: Kyphotic  Communication   Communication: HOH  Cognition Arousal/Alertness: Awake/alert Behavior During Therapy: Flat affect Overall Cognitive Status: Impaired/Different from baseline Area of Impairment: Following commands, Safety/judgement, Awareness, Problem solving, Memory                     Memory: Decreased recall of precautions, Decreased short-term memory Following Commands: Follows one step commands consistently, Follows one step commands with increased time Safety/Judgement: Decreased awareness of safety Awareness: Emergent Problem Solving: Slow processing, Requires verbal cues          General Comments General comments (skin integrity, edema, etc.): VSS on RA    Exercises     Assessment/Plan    PT Assessment Patient needs continued PT services  PT Problem List Decreased strength;Decreased activity tolerance;Decreased balance;Decreased mobility;Decreased cognition       PT Treatment Interventions DME instruction;Gait training;Stair training;Functional mobility training;Therapeutic activities;Therapeutic exercise;Balance training;Patient/family education    PT Goals (Current goals can be found in the Care Plan section)  Acute Rehab PT Goals Patient Stated Goal: walk some PT Goal Formulation: With patient Time For Goal Achievement: 09/19/22 Potential to Achieve Goals: Good    Frequency Min 1X/week     Co-evaluation               AM-PAC PT "6 Clicks" Mobility  Outcome Measure Help needed turning from your back to your side while in a flat bed without using bedrails?: A Little Help needed moving from lying on your back to sitting on the side of a flat bed without using  bedrails?: A Little Help needed moving to and from a bed to a chair (including a wheelchair)?: A Little Help needed standing up from a chair using your arms (e.g., wheelchair or bedside chair)?: A Little Help needed to walk in hospital room?: Total Help needed climbing 3-5 steps with a railing? : A Lot 6 Click Score: 15    End of Session   Activity Tolerance: Other (comment) (limited by diarrhea) Patient left: in bed;with call bell/phone within reach;with bed alarm set Nurse Communication: Mobility status (nurse tech) PT Visit Diagnosis: Unsteadiness on feet (R26.81);Muscle weakness (generalized) (M62.81)    Time: [id number] PT Time Calculation (min) (ACUTE ONLY): 26 min   Charges:   PT Evaluation $PT Eval Moderate Complexity: 1 Mod PT Treatments $Therapeutic Activity: 8-22 mins        Nelson 09/05/2022, 5:27 PM

## 2022-09-05 NOTE — Progress Notes (Signed)
PROGRESS NOTE  Rick [name] [id number] DOB: Jul 23, 1928   PCP: Tonia Ghent, MD  Patient is from: Home  DOA: 09/02/2022 LOS: 2  Chief complaints Chief Complaint  Patient presents with   hyponatremia     Brief Narrative / Interim history: 89 year old M with PMH of combined CHF, MR, CKD-5, PCKD, hyponatremia, GIB, prostate cancer, BPH and hypertension directed to ED by PCP for hyponatremia.  See in by PCP for generalized weakness, diarrhea and shortness of breath.  Lab work significant for hyponatremia to 190 and AKI.    In ED, stable vitals. Na 121. Cr 6.42 (baseline 5.8).  BUN 60.  K3.2.  GIB ordered.  Hyponatremia felt to be hypervolemic.  Started on IV Lasix and fluid restriction.   The next day, C. difficile panel positive.  Started on p.o. vancomycin.  Slowly improving.   Subjective: Seen and examined earlier this morning.  No major events overnight of this morning.  Continues to endorse diarrhea.  He denies abdominal pain.  Denies chest pain or dyspnea.  Objective: Vitals:   09/04/22 2034 09/05/22 0217 09/05/22 0604 09/05/22 1140  BP: 119/75 123/78 125/80 104/81  Pulse: 67 69 67 66  Resp: 18 18 18 20   Temp: (!) 97.1 F (36.2 C) 97.6 F (36.4 C) 97.6 F (36.4 C) (!) 97.2 F (36.2 C)  TempSrc: Oral Axillary Oral Axillary  SpO2: 100% 100% 99% 96%  Weight:  60.4 kg    Height:        Examination:  GENERAL: Appears frail.  Nontoxic. HEENT: MMM.  Vision and hearing grossly intact.  NECK: Supple.  No apparent JVD.  RESP:  No IWOB.  Fair aeration bilaterally. CVS:  RRR. Heart sounds normal.  ABD/GI/GU: BS+. Abd slightly distended.  Nontender. MSK/EXT:   No apparent deformity.  Significant muscle mass and subcu fat loss.  Trace edema. SKIN: no apparent skin lesion or wound NEURO: Awake and alert. Oriented appropriately.  No apparent focal neuro deficit. PSYCH: Calm. Normal affect.    Procedures:  None  Microbiology summarized: GIP negative C.  difficile positive  Assessment and plan: Principal Problem:   Hyponatremia Active Problems:   Polycystic kidney disease   Glaucoma   Essential hypertension   Hypothyroidism   HYPERCHOLESTEROLEMIA   GERD without esophagitis   History of prostate cancer   Anemia due to stage 4 chronic kidney disease (HCC)   BPH (benign prostatic hyperplasia)   Acute kidney injury superimposed on chronic kidney disease (HCC)   Clostridioides difficile infection   Goals of care, counseling/discussion   Hypoglycemia   C. difficile diarrhea: C. difficile testing positive.  Ongoing diarrhea.  Has mild leukocytosis. -Continue p.o. vancomycin 125 mg 4 times daily for 10 days.  Started on 10/27 -Continue enteric precaution  AKI on CKD-5/history of PCKD: Baseline Cr 5.5-5.7.  AKI likely due to GI loss.  He has lower extremity edema but intravascularly dry.  Not a candidate for dialysis. Recent Labs    08/16/22 0459 08/21/22 1317 09/01/22 1547 09/02/22 1245 09/02/22 1804 09/02/22 2040 09/03/22 0058 09/03/22 0825 09/04/22 0631 09/05/22 0001  BUN 77* 66* 57* 60* 60* 61* 60* 62* 60* 55*  CREATININE 5.49* 5.75* 6.11* 6.42* 6.46* 6.29* 6.14* 6.28* 6.04* 5.84*  -Restart IV fluid.  Hyponatremia: Has some BLE edema but intravascularly dry.  His TSH is very high.  Cortisol level within normal.  Sodium dropped again after stopping IV fluid Recent Labs  Lab 09/01/22 1547 09/02/22 1245 09/02/22 1804 09/02/22 2040 09/03/22 0058  09/03/22 0825 09/04/22 0631 09/05/22 0001  NA 119* 121* 123* 123* 122* 122* 124* 121*  -Resume IV NS. -Increased home Synthroid -Follow ACTH level   Chronic combined systolic and diastolic CHF: Doubt acute exacerbation.  BNP elevation likely chronic and from renal failure. -Continue holding diuretics -Continue IV fluid. -Closely monitor respiratory and fluid status, renal functions and electrolytes   Hypothyroidism: TSH elevated to 27.2. -Increased home Synthroid from 50  to 75 mcg -Recheck TSH in 4 to 6 weeks outpatient  Hypoglycemia: Likely due to diarrhea and poor p.o. intake.  Resolved. -Follow ACTH.  Metabolic acidosis: Likely due to renal failure and diarrhea. -Start p.o. sodium bicarbonate.   GERD -Continue home PPI   Hypertension: Normotensive. -Continue low-dose Toprol -Continue holding BiDil   BPH -Continue home tamsulosin   Anemia of allergies: Stable. -Continue monitoring  Goal of care counseling: Remains full code. -See discussion on 3/27 -Palliative medicine following.  Plan for family meeting on 3/29  Hypoalbuminemia: Received IV albumin.   Body mass index is 19.66 kg/m.          DVT prophylaxis:  heparin injection 5,000 Units Start: 09/02/22 2200  Code Status: Full code Family Communication: Updated patient's daughter at bedside Level of care: Telemetry Medical Status is: Inpatient Remains inpatient appropriate because: AKI, hyponatremia, hypoglycemia and C. difficile infection   Final disposition: TBD Consultants:  Palliative medicine  55 minutes with more than 50% spent in reviewing records, counseling patient/family and coordinating care.   Sch Meds:  Scheduled Meds:  atorvastatin  10 mg Oral Daily   heparin  5,000 Units Subcutaneous Q8H   levothyroxine  75 mcg Oral Q0600   metoprolol succinate  12.5 mg Oral Daily   pantoprazole  40 mg Oral BID AC   sodium bicarbonate  650 mg Oral TID   sodium chloride flush  3 mL Intravenous Q12H   tamsulosin  0.4 mg Oral Daily   timolol  1 drop Both Eyes q morning   vancomycin  125 mg Oral QID   Continuous Infusions:  sodium chloride 65 mL/hr at 09/05/22 0819    PRN Meds:.acetaminophen **OR** acetaminophen, polyethylene glycol  Antimicrobials: Anti-infectives (From admission, onward)    Start     Dose/Rate Route Frequency Ordered Stop   09/03/22 1315  vancomycin (VANCOCIN) capsule 125 mg        125 mg Oral 4 times daily 09/03/22 1301 09/13/22 1159         I have personally reviewed the following labs and images: CBC: Recent Labs  Lab 09/01/22 1547 09/02/22 1245 09/03/22 0058 09/04/22 0631 09/05/22 0001  WBC 9.5 8.7 11.6* 12.3* 10.6*  NEUTROABS 8.7* 8.0*  --   --   --   HGB 9.3* 9.2* 9.1* 8.6* 8.8*  HCT 27.5* 28.3* 27.5* 26.6* 27.2*  MCV 85.7 87.3 86.2 86.6 89.8  PLT 184.0 178 186 196 192   BMP &GFR Recent Labs  Lab 09/02/22 1804 09/02/22 2040 09/03/22 0058 09/03/22 0825 09/04/22 0631 09/05/22 0001  NA 123* 123* 122* 122* 124* 121*  K 3.9 3.5 3.3* 3.5 3.9 3.8  CL 91* 92* 91* 89* 94* 95*  CO2 19* 17* 16* 20* 17* 16*  GLUCOSE 56* 55* 47* 36* 119* 130*  BUN 60* 61* 60* 62* 60* 55*  CREATININE 6.46* 6.29* 6.14* 6.28* 6.04* 5.84*  CALCIUM 7.8* 7.6* 7.5* 7.5* 7.4* 7.7*  MG 1.9  --   --  1.9 1.9 1.9  PHOS  --   --   --   --  3.5 3.2   Estimated Creatinine Clearance: 7 mL/min (A) (by C-G formula based on SCr of 5.84 mg/dL (H)). Liver & Pancreas: Recent Labs  Lab 09/01/22 1547 09/02/22 1245 09/03/22 0825 09/04/22 0631 09/05/22 0001  AST 33 30  --  29  --   ALT 10 12  --  12  --   ALKPHOS 409* 363*  --  362*  --   BILITOT 0.8 1.0  --  0.7  --   PROT 7.1 6.4*  --  6.1*  --   ALBUMIN 2.4* 1.8* 1.7* 1.6* 1.9*   No results for input(s): "LIPASE", "AMYLASE" in the last 168 hours. No results for input(s): "AMMONIA" in the last 168 hours. Diabetic: No results for input(s): "HGBA1C" in the last 72 hours. Recent Labs  Lab 09/04/22 1212 09/04/22 1704 09/04/22 2056 09/05/22 0602 09/05/22 1137  GLUCAP 137* 141* 143* 86 124*   Cardiac Enzymes: No results for input(s): "CKTOTAL", "CKMB", "CKMBINDEX", "TROPONINI" in the last 168 hours. Recent Labs    02/17/22 1622  PROBNP 4,983*   Coagulation Profile: No results for input(s): "INR", "PROTIME" in the last 168 hours. Thyroid Function Tests: Recent Labs    09/02/22 1753  TSH 27.205*   Lipid Profile: No results for input(s): "CHOL", "HDL", "LDLCALC", "TRIG",  "CHOLHDL", "LDLDIRECT" in the last 72 hours. Anemia Panel: No results for input(s): "VITAMINB12", "FOLATE", "FERRITIN", "TIBC", "IRON", "RETICCTPCT" in the last 72 hours.  Urine analysis:    Component Value Date/Time   COLORURINE YELLOW 09/02/2022 2137   APPEARANCEUR CLOUDY (A) 09/02/2022 2137   LABSPEC 1.011 09/02/2022 2137   PHURINE 5.0 09/02/2022 2137   GLUCOSEU NEGATIVE 09/02/2022 2137   GLUCOSEU NEGATIVE 02/27/2022 1237   HGBUR MODERATE (A) 09/02/2022 2137   BILIRUBINUR NEGATIVE 09/02/2022 2137   KETONESUR NEGATIVE 09/02/2022 2137   PROTEINUR 30 (A) 09/02/2022 2137   UROBILINOGEN 0.2 02/27/2022 1237   NITRITE NEGATIVE 09/02/2022 2137   LEUKOCYTESUR LARGE (A) 09/02/2022 2137   Sepsis Labs: Invalid input(s): "PROCALCITONIN", "LACTICIDVEN"  Microbiology: Recent Results (from the past 240 hour(s))  Gastrointestinal Panel by PCR , Stool     Status: None   Collection Time: 09/02/22  4:00 PM   Specimen: Stool  Result Value Ref Range Status   Campylobacter species NOT DETECTED NOT DETECTED Final   Plesimonas shigelloides NOT DETECTED NOT DETECTED Final   Salmonella species NOT DETECTED NOT DETECTED Final   Yersinia enterocolitica NOT DETECTED NOT DETECTED Final   Vibrio species NOT DETECTED NOT DETECTED Final   Vibrio cholerae NOT DETECTED NOT DETECTED Final   Enteroaggregative E coli (EAEC) NOT DETECTED NOT DETECTED Final   Enteropathogenic E coli (EPEC) NOT DETECTED NOT DETECTED Final   Enterotoxigenic E coli (ETEC) NOT DETECTED NOT DETECTED Final   Shiga like toxin producing E coli (STEC) NOT DETECTED NOT DETECTED Final   Shigella/Enteroinvasive E coli (EIEC) NOT DETECTED NOT DETECTED Final   Cryptosporidium NOT DETECTED NOT DETECTED Final   Cyclospora cayetanensis NOT DETECTED NOT DETECTED Final   Entamoeba histolytica NOT DETECTED NOT DETECTED Final   Giardia lamblia NOT DETECTED NOT DETECTED Final   Adenovirus F40/41 NOT DETECTED NOT DETECTED Final   Astrovirus NOT  DETECTED NOT DETECTED Final   Norovirus GI/GII NOT DETECTED NOT DETECTED Final   Rotavirus A NOT DETECTED NOT DETECTED Final   Sapovirus (I, II, IV, and V) NOT DETECTED NOT DETECTED Final    Comment: Performed at [facility], [address], [place], [place] [address]  C Difficile  Quick Screen w PCR reflex     Status: Abnormal   Collection Time: 09/03/22 11:22 AM   Specimen: STOOL  Result Value Ref Range Status   C Diff antigen POSITIVE (A) NEGATIVE Final   C Diff toxin POSITIVE (A) NEGATIVE Final    Comment: CRITICAL RESULT CALLED TO, READ BACK BY AND VERIFIED WITH:  [id number] RN CAMERON LEWIS    C Diff interpretation Toxin producing C. difficile detected.  Final    Comment: Performed at [facility], [place] [address] [place], [place] [address]    Radiology Studies: No results found.    Kamala Kolton T. McLendon-Chisholm  If 7PM-7AM, please contact night-coverage [email or web address] 09/05/2022, 12:48 PM

## 2022-09-05 NOTE — Progress Notes (Signed)
Palliative:  Meeting was scheduled for this AM to include patient's wife and daughter [name]. [name] called this AM to share she would like more time to discuss with her mother prior to meeting with [place]. Meeting delayed until 3/30 9 am.   Juel Burrow, DNP, [facility] Palliative Medicine Team Team Phone # [phone]  Pager # [phone]  NO CHARGE

## 2022-09-06 DIAGNOSIS — E7800 Pure hypercholesterolemia, unspecified: Secondary | ICD-10-CM | POA: Diagnosis not present

## 2022-09-06 DIAGNOSIS — E871 Hypo-osmolality and hyponatremia: Secondary | ICD-10-CM | POA: Diagnosis not present

## 2022-09-06 DIAGNOSIS — Z66 Do not resuscitate: Secondary | ICD-10-CM | POA: Diagnosis not present

## 2022-09-06 DIAGNOSIS — I5020 Unspecified systolic (congestive) heart failure: Secondary | ICD-10-CM | POA: Diagnosis not present

## 2022-09-06 DIAGNOSIS — I10 Essential (primary) hypertension: Secondary | ICD-10-CM | POA: Diagnosis not present

## 2022-09-06 DIAGNOSIS — Z515 Encounter for palliative care: Secondary | ICD-10-CM

## 2022-09-06 DIAGNOSIS — Z7189 Other specified counseling: Secondary | ICD-10-CM | POA: Diagnosis not present

## 2022-09-06 DIAGNOSIS — N189 Chronic kidney disease, unspecified: Secondary | ICD-10-CM | POA: Diagnosis not present

## 2022-09-06 LAB — CBC
HCT: 25 % — ABNORMAL LOW (ref 39.0–52.0)
Hemoglobin: 8.2 g/dL — ABNORMAL LOW (ref 13.0–17.0)
MCH: 28.2 pg (ref 26.0–34.0)
MCHC: 32.8 g/dL (ref 30.0–36.0)
MCV: 85.9 fL (ref 80.0–100.0)
Platelets: 186 10*3/uL (ref 150–400)
RBC: 2.91 MIL/uL — ABNORMAL LOW (ref 4.22–5.81)
RDW: 16.5 % — ABNORMAL HIGH (ref 11.5–15.5)
WBC: 8 10*3/uL (ref 4.0–10.5)
nRBC: 0 % (ref 0.0–0.2)

## 2022-09-06 LAB — RENAL FUNCTION PANEL
Albumin: 1.7 g/dL — ABNORMAL LOW (ref 3.5–5.0)
Anion gap: 9 (ref 5–15)
BUN: 51 mg/dL — ABNORMAL HIGH (ref 8–23)
CO2: 16 mmol/L — ABNORMAL LOW (ref 22–32)
Calcium: 7.5 mg/dL — ABNORMAL LOW (ref 8.9–10.3)
Chloride: 98 mmol/L (ref 98–111)
Creatinine, Ser: 5.12 mg/dL — ABNORMAL HIGH (ref 0.61–1.24)
GFR, Estimated: 10 mL/min — ABNORMAL LOW (ref >60)
Glucose, Bld: 97 mg/dL (ref 70–99)
Phosphorus: 2.8 mg/dL (ref 2.5–4.6)
Potassium: 3.9 mmol/L (ref 3.5–5.1)
Sodium: 123 mmol/L — ABNORMAL LOW (ref 135–145)

## 2022-09-06 LAB — GLUCOSE, CAPILLARY
Glucose-Capillary: 80 mg/dL (ref 70–99)
Glucose-Capillary: 95 mg/dL (ref 70–99)
Glucose-Capillary: 82 mg/dL (ref 70–99)
Glucose-Capillary: 95 mg/dL (ref 70–99)

## 2022-09-06 LAB — MAGNESIUM: Magnesium: 1.8 mg/dL (ref 1.7–2.4)

## 2022-09-06 MED ORDER — SODIUM CHLORIDE 0.9 % IV SOLN: INTRAVENOUS | Status: DC

## 2022-09-06 MED ORDER — SIMETHICONE 80 MG PO CHEW
80.0000 mg | CHEWABLE_TABLET | Freq: Four times a day (QID) | ORAL | Status: DC
  Administered 2022-09-06 – 2022-09-08 (×6): 80 mg via ORAL
  Filled 2022-09-06 (×6): qty 1

## 2022-09-06 MED ORDER — VANCOMYCIN HCL 125 MG PO CAPS
125.0000 mg | ORAL_CAPSULE | Freq: Four times a day (QID) | ORAL | Status: DC
  Administered 2022-09-06 – 2022-09-08 (×8): 125 mg via ORAL
  Filled 2022-09-06 (×8): qty 1

## 2022-09-06 NOTE — Progress Notes (Signed)
PROGRESS NOTE  Rick [name] [id number] DOB: 1929/02/23   PCP: Tonia Ghent, MD  Patient is from: Home  DOA: 09/02/2022 LOS: 3  Chief complaints Chief Complaint  Patient presents with   hyponatremia     Brief Narrative / Interim history: 89 year old M with PMH of combined CHF, MR, CKD-5, PCKD, hyponatremia, GIB, prostate cancer, BPH and hypertension directed to ED by PCP for hyponatremia.  See in by PCP for generalized weakness, diarrhea and shortness of breath.  Lab work significant for hyponatremia to 190 and AKI.    In ED, stable vitals. Na 121. Cr 6.42 (baseline 5.8).  BUN 60.  K3.2.  GIB ordered.  Hyponatremia felt to be hypervolemic.  Initially started on IV Lasix and fluid restriction.  However, with ongoing diarrhea.  Diuretics and fluid restriction lifted and started on IV fluid.  C. difficile panel positive.  Started on p.o. vancomycin.  Diarrhea and AKI seems to have resolved.  Hyponatremia improving.  Given his age, frailty and comorbidities, goal of care discussion initiated on palliative medicine consulted.  He is now DNR/DNI.   Subjective: Seen and examined earlier this morning.  No major events overnight of this morning.  No complaints.  Diarrhea seems to have subsided.  Eager to go home but understand the need to stay in the hospital for hyponatremia.  Patient's daughter and wife at bedside.  Objective: Vitals:   09/06/22 0507 09/06/22 0753 09/06/22 1017 09/06/22 1205  BP: 126/81 123/80 123/81 113/74  Pulse: 70 68 81 71  Resp:  18  18  Temp: (!) 97.3 F (36.3 C) (!) 97.4 F (36.3 C)  97.6 F (36.4 C)  TempSrc: Oral Oral  Oral  SpO2: 100% 99%  100%  Weight:      Height:        Examination:  GENERAL: Appears frail.  Nontoxic. HEENT: MMM.  Vision and hearing grossly intact.  NECK: Supple.  No apparent JVD.  RESP:  No IWOB.  Fair aeration bilaterally. CVS:  RRR. Heart sounds normal.  ABD/GI/GU: BS+. Abd slightly distended but  nontender. MSK/EXT:   No apparent deformity. Moves extremities.  Trace BLE edema. SKIN: no apparent skin lesion or wound NEURO: Awake and alert. Oriented appropriately.  No apparent focal neuro deficit. PSYCH: Calm. Normal affect.    Procedures:  None  Microbiology summarized: GIP negative C. difficile positive  Assessment and plan: Principal Problem:   Hyponatremia Active Problems:   Polycystic kidney disease   Glaucoma   Essential hypertension   Hypothyroidism   HYPERCHOLESTEROLEMIA   GERD without esophagitis   History of prostate cancer   Anemia due to stage 4 chronic kidney disease (HCC)   BPH (benign prostatic hyperplasia)   Acute kidney injury superimposed on chronic kidney disease (HCC)   Clostridioides difficile infection   Goals of care, counseling/discussion   Hypoglycemia   DNR (do not resuscitate)   C. difficile diarrhea: C. difficile testing positive.  Diarrhea and leukocytosis resolved. -Continue p.o. vancomycin 125 mg 4 times daily for 10 days.  Started on 10/27 -Continue enteric precaution -Continue IV fluid  AKI on CKD-5/hx of PCKD: Baseline Cr 5.5-5.7.  AKI likely due to GI loss.  Resolved with IV fluid.  Not a candidate for dialysis. Recent Labs    08/21/22 1317 09/01/22 1547 09/02/22 1245 09/02/22 1804 09/02/22 2040 09/03/22 0058 09/03/22 0825 09/04/22 0631 09/05/22 0001 09/06/22 0040  BUN 66* 57* 60* 60* 61* 60* 62* 60* 55* 51*  CREATININE 5.75* 6.11* 6.42* 6.46*  6.29* 6.14* 6.28* 6.04* 5.84* 5.12*  -Continue IV fluid -Recheck in the morning  Hyponatremia: intravascularly dry.  His TSH is very high.  Cortisol level within normal.  Improving with IV fluid. Recent Labs  Lab 09/01/22 1547 09/02/22 1245 09/02/22 1804 09/02/22 2040 09/03/22 0058 09/03/22 0825 09/04/22 0631 09/05/22 0001 09/06/22 0040  NA 119* 121* 123* 123* 122* 122* 124* 121* 123*  -No IV fluid -Increased home Synthroid -Follow ACTH level   Chronic combined  systolic and diastolic CHF: Doubt acute exacerbation.  BNP elevation likely chronic and from renal failure. -Continue holding diuretics -Continue IV fluid. -Closely monitor respiratory and fluid status, renal functions and electrolytes   Hypothyroidism: TSH elevated to 27.2. -Increased home Synthroid from 50 to 75 mcg -Recheck TSH in 4 to 6 weeks outpatient  Hypoglycemia: Likely due to diarrhea and poor p.o. intake.  Resolved. -Follow ACTH.  Metabolic acidosis: Likely due to renal failure and diarrhea. -Start p.o. sodium bicarbonate.   GERD -Continue home PPI   Hypertension: Normotensive. -Continue low-dose Toprol -Continue holding BiDil   BPH -Continue home tamsulosin   Anemia of allergies: Stable. -Continue monitoring  Goal of care counseling: DNR/DNI. -Appreciate help by palliative medicine  Hypoalbuminemia: Received IV albumin.   Body mass index is 19.92 kg/m. Consult dietitian         DVT prophylaxis:  heparin injection 5,000 Units Start: 09/02/22 2200  Code Status: DNR/DNI Family Communication: Updated patient's daughter and wife at bedside Level of care: Telemetry Medical Status is: Inpatient Remains inpatient appropriate because: EMEA and C. difficile infection   Final disposition: TBD Consultants:  Palliative medicine  35 minutes with more than 50% spent in reviewing records, counseling patient/family and coordinating care.   Sch Meds:  Scheduled Meds:  atorvastatin  10 mg Oral Daily   heparin  5,000 Units Subcutaneous Q8H   levothyroxine  75 mcg Oral Q0600   metoprolol succinate  12.5 mg Oral Daily   pantoprazole  40 mg Oral BID AC   sodium bicarbonate  650 mg Oral TID   sodium chloride flush  3 mL Intravenous Q12H   tamsulosin  0.4 mg Oral Daily   timolol  1 drop Both Eyes q morning   vancomycin  125 mg Oral QID   Continuous Infusions:  sodium chloride 65 mL/hr at 09/06/22 1023    PRN Meds:.acetaminophen **OR** acetaminophen,  polyethylene glycol  Antimicrobials: Anti-infectives (From admission, onward)    Start     Dose/Rate Route Frequency Ordered Stop   09/06/22 1600  vancomycin (VANCOCIN) capsule 125 mg        125 mg Oral 4 times daily 09/06/22 1054 09/13/22 1559   09/03/22 1315  vancomycin (VANCOCIN) capsule 125 mg  Status:  Discontinued        125 mg Oral 4 times daily 09/03/22 1301 09/06/22 1054        I have personally reviewed the following labs and images: CBC: Recent Labs  Lab 09/01/22 1547 09/02/22 1245 09/03/22 0058 09/04/22 0631 09/05/22 0001 09/06/22 0040  WBC 9.5 8.7 11.6* 12.3* 10.6* 8.0  NEUTROABS 8.7* 8.0*  --   --   --   --   HGB 9.3* 9.2* 9.1* 8.6* 8.8* 8.2*  HCT 27.5* 28.3* 27.5* 26.6* 27.2* 25.0*  MCV 85.7 87.3 86.2 86.6 89.8 85.9  PLT 184.0 178 186 196 192 186   BMP &GFR Recent Labs  Lab 09/02/22 1804 09/02/22 2040 09/03/22 0058 09/03/22 0825 09/04/22 0631 09/05/22 0001 09/06/22 0040  NA 123*   < >  122* 122* 124* 121* 123*  K 3.9   < > 3.3* 3.5 3.9 3.8 3.9  CL 91*   < > 91* 89* 94* 95* 98  CO2 19*   < > 16* 20* 17* 16* 16*  GLUCOSE 56*   < > 47* 36* 119* 130* 97  BUN 60*   < > 60* 62* 60* 55* 51*  CREATININE 6.46*   < > 6.14* 6.28* 6.04* 5.84* 5.12*  CALCIUM 7.8*   < > 7.5* 7.5* 7.4* 7.7* 7.5*  MG 1.9  --   --  1.9 1.9 1.9 1.8  PHOS  --   --   --   --  3.5 3.2 2.8   < > = values in this interval not displayed.   Estimated Creatinine Clearance: 8.1 mL/min (A) (by C-G formula based on SCr of 5.12 mg/dL (H)). Liver & Pancreas: Recent Labs  Lab 09/01/22 1547 09/02/22 1245 09/03/22 0825 09/04/22 0631 09/05/22 0001 09/06/22 0040  AST 33 30  --  29  --   --   ALT 10 12  --  12  --   --   ALKPHOS 409* 363*  --  362*  --   --   BILITOT 0.8 1.0  --  0.7  --   --   PROT 7.1 6.4*  --  6.1*  --   --   ALBUMIN 2.4* 1.8* 1.7* 1.6* 1.9* 1.7*   No results for input(s): "LIPASE", "AMYLASE" in the last 168 hours. No results for input(s): "AMMONIA" in the last 168  hours. Diabetic: No results for input(s): "HGBA1C" in the last 72 hours. Recent Labs  Lab 09/05/22 1137 09/05/22 1623 09/05/22 2100 09/06/22 0652 09/06/22 1203  GLUCAP 124* 126* 131* 80 95   Cardiac Enzymes: No results for input(s): "CKTOTAL", "CKMB", "CKMBINDEX", "TROPONINI" in the last 168 hours. Recent Labs    02/17/22 1622  PROBNP 4,983*   Coagulation Profile: No results for input(s): "INR", "PROTIME" in the last 168 hours. Thyroid Function Tests: No results for input(s): "TSH", "T4TOTAL", "FREET4", "T3FREE", "THYROIDAB" in the last 72 hours.  Lipid Profile: No results for input(s): "CHOL", "HDL", "LDLCALC", "TRIG", "CHOLHDL", "LDLDIRECT" in the last 72 hours. Anemia Panel: No results for input(s): "VITAMINB12", "FOLATE", "FERRITIN", "TIBC", "IRON", "RETICCTPCT" in the last 72 hours.  Urine analysis:    Component Value Date/Time   COLORURINE YELLOW 09/02/2022 2137   APPEARANCEUR CLOUDY (A) 09/02/2022 2137   LABSPEC 1.011 09/02/2022 2137   PHURINE 5.0 09/02/2022 2137   GLUCOSEU NEGATIVE 09/02/2022 2137   GLUCOSEU NEGATIVE 02/27/2022 1237   HGBUR MODERATE (A) 09/02/2022 2137   BILIRUBINUR NEGATIVE 09/02/2022 2137   KETONESUR NEGATIVE 09/02/2022 2137   PROTEINUR 30 (A) 09/02/2022 2137   UROBILINOGEN 0.2 02/27/2022 1237   NITRITE NEGATIVE 09/02/2022 2137   LEUKOCYTESUR LARGE (A) 09/02/2022 2137   Sepsis Labs: Invalid input(s): "PROCALCITONIN", "LACTICIDVEN"  Microbiology: Recent Results (from the past 240 hour(s))  Gastrointestinal Panel by PCR , Stool     Status: None   Collection Time: 09/02/22  4:00 PM   Specimen: Stool  Result Value Ref Range Status   Campylobacter species NOT DETECTED NOT DETECTED Final   Plesimonas shigelloides NOT DETECTED NOT DETECTED Final   Salmonella species NOT DETECTED NOT DETECTED Final   Yersinia enterocolitica NOT DETECTED NOT DETECTED Final   Vibrio species NOT DETECTED NOT DETECTED Final   Vibrio cholerae NOT DETECTED NOT  DETECTED Final   Enteroaggregative E coli (EAEC) NOT DETECTED NOT DETECTED Final  Enteropathogenic E coli (EPEC) NOT DETECTED NOT DETECTED Final   Enterotoxigenic E coli (ETEC) NOT DETECTED NOT DETECTED Final   Shiga like toxin producing E coli (STEC) NOT DETECTED NOT DETECTED Final   Shigella/Enteroinvasive E coli (EIEC) NOT DETECTED NOT DETECTED Final   Cryptosporidium NOT DETECTED NOT DETECTED Final   Cyclospora cayetanensis NOT DETECTED NOT DETECTED Final   Entamoeba histolytica NOT DETECTED NOT DETECTED Final   Giardia lamblia NOT DETECTED NOT DETECTED Final   Adenovirus F40/41 NOT DETECTED NOT DETECTED Final   Astrovirus NOT DETECTED NOT DETECTED Final   Norovirus GI/GII NOT DETECTED NOT DETECTED Final   Rotavirus A NOT DETECTED NOT DETECTED Final   Sapovirus (I, II, IV, and V) NOT DETECTED NOT DETECTED Final    Comment: Performed at [facility], [address] [place], [place] [address]  C Difficile Quick Screen w PCR reflex     Status: Abnormal   Collection Time: 09/03/22 11:22 AM   Specimen: STOOL  Result Value Ref Range Status   C Diff antigen POSITIVE (A) NEGATIVE Final   C Diff toxin POSITIVE (A) NEGATIVE Final    Comment: CRITICAL RESULT CALLED TO, READ BACK BY AND VERIFIED WITH:  [id number] RN CAMERON LEWIS    C Diff interpretation Toxin producing C. difficile detected.  Final    Comment: Performed at [facility], [place] [address] [place], [place] [address]    Radiology Studies: No results found.    Miata Culbreth T. Timber[place]  If 7PM-7AM, please contact night-coverage [email or web address] 09/06/2022, 1:29 PM

## 2022-09-06 NOTE — Progress Notes (Signed)
Palliative Medicine Progress Note   Patient Name: Rick [name]       Date: 09/06/2022 DOB: July 16, 1928  Age: 89 y.o. MRN#: [id number] Attending Physician: Mercy Riding, MD Primary Care Physician: Tonia Ghent, MD Admit Date: 09/02/2022    HPI/Patient Profile: 89 y.o. male  with past medical history of CHF, MR, CKD-5, PCKD, hyponatremia, GIB, prostate cancer, BPH and hypertension admitted on 09/02/2022 with hyponatremia.  Patient found to have creatinine of 6.42 (baseline 5.5-5.7).  Patient also found to be C. difficile positive and started on vancomycin.  Patient also found to have TSH of 27.2.  PMT consulted to discuss goals of care.   Subjective: Chart reviewed including progress notes, labs, and imaging.  I met today at bedside with patient, his wife [name], and his daughter [name].  Patient falls asleep intermittently during our discussion, but does make a few comments that seem appropriate.  We discussed patient's current illness (C-diff and AKI) and what it means in the larger context of his ongoing comorbidities.  We reviewed that he has advanced heart failure and chronic kidney failure, causing that these are noncurable and progressive illnesses.  Reviewed that per nephrology, patient would be a poor candidate for dialysis in the setting of his advanced age and other co-morbidities.  We reviewed patient's medical course over the past few months.  Discussed that he has had multiple hospitalizations as well as decline in functional status.  Discussed the limitations of medical interventions in the setting of noncurable and progressive illness.  We reviewed different paths of care - full scope versus limited interventions (treat the treatable) versus comfort care. We also had a detailed  discussion regarding code status. Encouraged sideration of DNR/DNI status understanding evidenced based poor outcomes in similar hospitalized patients, as the cause of the arrest is likely associated with chronic/terminal disease rather than a reversible acute cardio-pulmonary event.  Reviewed that DNR/DNI does not change the medical plan and it only comes into effect after a person has arrested (died).  It is a protective measure to keep [place] from harming the patient in their last moments of life.   Wife shares that patient has told her to "let him go" when it is (his) time. This is different than what he has stated in the past. We discussed that a person's goals/wishes  often change as their condition worsens over time. Wife and daughter both agree to having a DNR in place.   For medical situations other that cardiac arrest, family agrees to limited interventions such as IV fluids, cardiac monitoring, antibiotics, BiPAP, and cardioversion if needed.  They understand this would not include mechanical ventilation.  Discussed the importance of continued conversation with family and the medical team regarding overall plan of care and treatment options.   Objective:  Physical Exam Vitals reviewed.  Constitutional:      General: He is not in acute distress.    Comments: Chronically ill-appearing  Pulmonary:     Effort: Pulmonary effort is normal.  Neurological:     Mental Status: He is alert.     Motor: Weakness present.     Comments: Unclear - appropriate comments at times             Vital Signs: BP 123/81   Pulse 81   Temp (!) 97.4 F (36.3 C) (Oral)   Resp 18   Ht 5\' 9"  (1.753 m)   Wt 61.2 kg   SpO2 99%   BMI 19.92 kg/m  SpO2: SpO2: 99 % O2 Device: O2 Device: Room Air O2 Flow Rate:     Palliative Medicine Assessment & Plan   Assessment: Principal Problem:   Hyponatremia Active Problems:   HYPERCHOLESTEROLEMIA   Essential hypertension   GERD without esophagitis   History  of prostate cancer   Polycystic kidney disease   Glaucoma   Anemia due to stage 4 chronic kidney disease (HCC)   BPH (benign prostatic hyperplasia)   Hypothyroidism   Acute kidney injury superimposed on chronic kidney disease (HCC)   Clostridioides difficile infection   Goals of care, counseling/discussion   Hypoglycemia    Recommendations/Plan: Code status changed to DNR/DNI Continue supportive/limited interventions - "treat the treatable" PMT will continue to follow   Prognosis:  Unable to determine  Discharge Planning: Home   Thank you for allowing the Palliative Medicine Team to assist in the care of this patient.   Greater than 50%  of this time was spent counseling and coordinating care related to the above assessment and plan.  Total time: 70 minutes   Lavena Bullion, NP Palliative Medicine   Please contact Palliative Medicine Team phone at [phone] for questions and concerns.  For individual provider, see AMION.

## 2022-09-06 NOTE — Plan of Care (Signed)

## 2022-09-07 DIAGNOSIS — I10 Essential (primary) hypertension: Secondary | ICD-10-CM | POA: Diagnosis not present

## 2022-09-07 DIAGNOSIS — Z66 Do not resuscitate: Secondary | ICD-10-CM | POA: Diagnosis not present

## 2022-09-07 DIAGNOSIS — R197 Diarrhea, unspecified: Secondary | ICD-10-CM

## 2022-09-07 DIAGNOSIS — E871 Hypo-osmolality and hyponatremia: Secondary | ICD-10-CM | POA: Diagnosis not present

## 2022-09-07 DIAGNOSIS — E7800 Pure hypercholesterolemia, unspecified: Secondary | ICD-10-CM | POA: Diagnosis not present

## 2022-09-07 DIAGNOSIS — I5043 Acute on chronic combined systolic (congestive) and diastolic (congestive) heart failure: Secondary | ICD-10-CM

## 2022-09-07 LAB — GLUCOSE, CAPILLARY
Glucose-Capillary: 72 mg/dL (ref 70–99)
Glucose-Capillary: 88 mg/dL (ref 70–99)
Glucose-Capillary: 90 mg/dL (ref 70–99)
Glucose-Capillary: 85 mg/dL (ref 70–99)

## 2022-09-07 LAB — RENAL FUNCTION PANEL
Albumin: 1.7 g/dL — ABNORMAL LOW (ref 3.5–5.0)
Anion gap: 8 (ref 5–15)
BUN: 49 mg/dL — ABNORMAL HIGH (ref 8–23)
CO2: 18 mmol/L — ABNORMAL LOW (ref 22–32)
Calcium: 7.5 mg/dL — ABNORMAL LOW (ref 8.9–10.3)
Chloride: 97 mmol/L — ABNORMAL LOW (ref 98–111)
Creatinine, Ser: 5.13 mg/dL — ABNORMAL HIGH (ref 0.61–1.24)
GFR, Estimated: 10 mL/min — ABNORMAL LOW (ref >60)
Glucose, Bld: 90 mg/dL (ref 70–99)
Phosphorus: 2.8 mg/dL (ref 2.5–4.6)
Potassium: 3.8 mmol/L (ref 3.5–5.1)
Sodium: 123 mmol/L — ABNORMAL LOW (ref 135–145)

## 2022-09-07 MED ORDER — SODIUM CHLORIDE 1 G PO TABS
1.0000 g | ORAL_TABLET | Freq: Three times a day (TID) | ORAL | Status: DC
  Administered 2022-09-07 – 2022-09-08 (×3): 1 g via ORAL
  Filled 2022-09-07 (×3): qty 1

## 2022-09-07 NOTE — Progress Notes (Signed)
Palliative Medicine Progress Note   Patient Name: Rick [name]       Date: 09/07/2022 DOB: [birth year]  Age: 89 y.o. MRN#: [id number] Attending Physician: Almon Hercules, MD Primary Care Physician: Joaquim Nam, MD Admit Date: 09/02/2022    HPI/Patient Profile: 89 y.o. male with past medical history of CHF, MR, CKD-5, PCKD, hyponatremia, GIB, prostate cancer, BPH and hypertension admitted on 09/02/2022 with hyponatremia. Patient found to have creatinine of 6.42 (baseline 5.5-5.7). Patient also found to be C. difficile positive and started on vancomycin. Patient also found to have TSH of 27.2. PMT consulted to discuss goals of care.   Subjective: Chart reviewed. Patient OOB to the recliner. He reports feeling "good". His only complaint is that his bottom is sore. [name] and son are at bedside. They are hopeful Rick [name] can go home tomorrow.   I asked patient and family if they had follow-up questions or concerns from our discussion yesterday. They do not have any questions/concerns at this time.    Objective:  Physical Exam Vitals reviewed.  Constitutional:      General: He is not in acute distress.    Comments: Chronically ill-appearing  Pulmonary:     Effort: Pulmonary effort is normal.  Neurological:     Mental Status: He is alert.             Vital Signs: BP 113/77 (BP Location: Right Arm)   Pulse 60   Temp (!) 97.2 F (36.2 C) (Axillary)   Resp 18   Ht 5\' 9"  (1.753 m)   Wt 59.9 kg   SpO2 100%   BMI 19.50 kg/m  SpO2: SpO2: 100 % O2 Device: O2 Device: Room Air   Palliative Medicine Assessment & Plan   Assessment: Principal Problem:   Hyponatremia Active Problems:   HYPERCHOLESTEROLEMIA   Essential hypertension   GERD without esophagitis   History  of prostate cancer   Polycystic kidney disease   Glaucoma   Anemia due to stage 4 chronic kidney disease (HCC)   BPH (benign prostatic hyperplasia)   Hypothyroidism   Acute kidney injury superimposed on chronic kidney disease (HCC)   Clostridioides difficile infection   Goals of care, counseling/discussion   Hypoglycemia   DNR (do not resuscitate)   HFrEF (heart failure with reduced ejection fraction) (HCC)  Palliative care by specialist    Recommendations/Plan: Continue supportive/limited interventions - "treat the treatable" Ordered pressure redistribution chair pad (Geomat) Likely discharging home tomorrow  Code Status: DNR   Prognosis:  Unable to determine  Discharge Planning: Home    Thank you for allowing the Palliative Medicine Team to assist in the care of this patient.   MDM - moderate   Merry Proud, NP   Please contact Palliative Medicine Team phone at [phone] for questions and concerns.  For individual providers, please see AMION.

## 2022-09-07 NOTE — Progress Notes (Signed)
Mobility Specialist Progress Note    09/07/22 1405  Mobility  Activity Ambulated with assistance in room  Level of Assistance Minimal assist, patient does 75% or more  Assistive Device Front wheel walker  Distance Ambulated (ft) 10 ft  Activity Response Tolerated well  Mobility Referral Yes  $Mobility charge 1 Mobility   Pt received in bed and agreeable to get up to chair. No complaints. Left with call bell in reach and family present.  Hildred Alamin Mobility Specialist  Please Psychologist, sport and exercise or Rehab Office at [phone]

## 2022-09-07 NOTE — Plan of Care (Signed)

## 2022-09-07 NOTE — Progress Notes (Signed)
PROGRESS NOTE  Rick [name] [id number] DOB: [birth year]   PCP: Tonia Ghent, MD  Patient is from: Home  DOA: 09/02/2022 LOS: 4  Chief complaints Chief Complaint  Patient presents with   hyponatremia     Brief Narrative / Interim history: 89 year old M with PMH of combined CHF, MR, CKD-5, PCKD, hyponatremia, GIB, prostate cancer, BPH and hypertension directed to ED by PCP for hyponatremia.  See in by PCP for generalized weakness, diarrhea and shortness of breath.  Lab work significant for hyponatremia to 190 and AKI.    In ED, stable vitals. Na 121. Cr 6.42 (baseline 5.8).  BUN 60.  K3.2.  GIB ordered.  Hyponatremia felt to be hypervolemic.  Initially started on IV Lasix and fluid restriction.  However, with ongoing diarrhea.  Diuretics and fluid restriction lifted and started on IV fluid.  C. difficile panel positive.  Started on p.o. vancomycin.  Diarrhea and AKI seems to have resolved.  Hyponatremia persisted after initial improvement.  Given his age, frailty and comorbidities, goal of care discussion initiated on palliative medicine consulted.  He is now DNR/DNI.   Subjective: Seen and examined earlier this morning.  No major events overnight of this morning.  Reports 1 loose bowel movement overnight.  No other complaints.  Eager to go home.  Hyponatremia persisted after initial improvement with IV fluid.  Patient's wife at bedside  Objective: Vitals:   09/07/22 0240 09/07/22 0530 09/07/22 0724 09/07/22 1140  BP: 127/79 124/77 129/77 113/77  Pulse: 73  72 60  Resp: 18 18 18 18   Temp: 97.6 F (36.4 C) (!) 97.3 F (36.3 C) (!) 97.2 F (36.2 C)   TempSrc: Oral Oral Axillary   SpO2: 100% 99% 100% 100%  Weight: 59.9 kg     Height:        Examination:  GENERAL: Appears frail.  Nontoxic. HEENT: MMM.  Vision and hearing grossly intact.  NECK: Supple.  No apparent JVD.  RESP:  No IWOB.  Fair aeration bilaterally. CVS:  RRR. Heart sounds normal.  ABD/GI/GU: BS+.  Abd slightly distended.  No tenderness MSK/EXT:   No apparent deformity. Moves extremities.  Trace BLE edema. SKIN: no apparent skin lesion or wound NEURO: Awake and alert. Oriented appropriately.  No apparent focal neuro deficit. PSYCH: Calm. Normal affect.    Procedures:  None  Microbiology summarized: GIP negative C. difficile positive  Assessment and plan: Principal Problem:   Hyponatremia Active Problems:   Polycystic kidney disease   Glaucoma   Essential hypertension   Hypothyroidism   HYPERCHOLESTEROLEMIA   GERD without esophagitis   History of prostate cancer   Anemia due to stage 4 chronic kidney disease (HCC)   BPH (benign prostatic hyperplasia)   Acute kidney injury superimposed on chronic kidney disease (HCC)   Clostridioides difficile infection   Goals of care, counseling/discussion   Hypoglycemia   DNR (do not resuscitate)   HFrEF (heart failure with reduced ejection fraction) ([place])   Palliative care by specialist   C. difficile diarrhea: C. difficile testing positive.  Diarrhea and leukocytosis resolved. -Continue p.o. vancomycin 125 mg 4 times daily for 10 days.  Started on 10/27 -Continue enteric precaution -Discontinue IV fluid  AKI on CKD-5/hx of PCKD: Baseline Cr 5.5-5.7.  AKI likely due to GI loss.  Resolved with IV fluid.  Not a candidate for dialysis. Recent Labs    09/01/22 1547 09/02/22 1245 09/02/22 1804 09/02/22 2040 09/03/22 0058 09/03/22 0825 09/04/22 0631 09/05/22 0001 09/06/22 0040 09/07/22  0045  BUN 57* 60* 60* 61* 60* 62* 60* 55* 51* 49*  CREATININE 6.11* 6.42* 6.46* 6.29* 6.14* 6.28* 6.04* 5.84* 5.12* 5.13*  -IV fluid -Recheck in the morning  Hyponatremia: Persisted after initial improvement with IV fluid.  TSH very high.  A.m. cortisol and ACTH normal. Recent Labs  Lab 09/01/22 1547 09/02/22 1245 09/02/22 1804 09/02/22 2040 09/03/22 0058 09/03/22 0825 09/04/22 0631 09/05/22 0001 09/06/22 0040 09/07/22 0045  NA  119* 121* 123* 123* 122* 122* 124* 121* 123* 123*  -Discontinue IV fluid -Start p.o. sodium chloride 1 g 3 times daily -Fluid restriction to 1200 cc a day -Not a candidate for tolvaptan due to very low renal clearance.   Chronic combined systolic and diastolic CHF: Doubt acute exacerbation.  BNP elevation likely chronic and from renal failure. -Continue holding diuretics -Discontinue IV fluid -Closely monitor respiratory and fluid status, renal functions and electrolytes   Hypothyroidism: TSH elevated to 27.2. -Increased home Synthroid from 50 to 75 mcg -Recheck TSH in 4 to 6 weeks outpatient  Hypoglycemia: Likely due to diarrhea and poor p.o. intake.  Resolved. -Follow ACTH.  Metabolic acidosis: Likely due to renal failure and diarrhea. -Start p.o. sodium bicarbonate.   GERD -Continue home PPI   Hypertension: Normotensive. -Continue low-dose Toprol -Continue holding BiDil   BPH -Continue home tamsulosin   Anemia of allergies: Stable. -Continue monitoring  Goal of care counseling: DNR/DNI. -Appreciate help by palliative medicine  Hypoalbuminemia: Received IV albumin.   Body mass index is 19.5 kg/m. Consult dietitian         DVT prophylaxis:  heparin injection 5,000 Units Start: 09/02/22 2200  Code Status: DNR/DNI Family Communication: Updated patient's wife at bedside Level of care: Telemetry Medical Status is: Inpatient Remains inpatient appropriate because: Hyponatremia   Final disposition: Home in the next 24 to 48 hours Consultants:  Palliative medicine  35 minutes with more than 50% spent in reviewing records, counseling patient/family and coordinating care.   Sch Meds:  Scheduled Meds:  atorvastatin  10 mg Oral Daily   heparin  5,000 Units Subcutaneous Q8H   levothyroxine  75 mcg Oral Q0600   metoprolol succinate  12.5 mg Oral Daily   pantoprazole  40 mg Oral BID AC   simethicone  80 mg Oral QID   sodium bicarbonate  650 mg Oral TID    sodium chloride flush  3 mL Intravenous Q12H   sodium chloride  1 g Oral TID WC   tamsulosin  0.4 mg Oral Daily   timolol  1 drop Both Eyes q morning   vancomycin  125 mg Oral QID   Continuous Infusions:    PRN Meds:.acetaminophen **OR** acetaminophen, polyethylene glycol  Antimicrobials: Anti-infectives (From admission, onward)    Start     Dose/Rate Route Frequency Ordered Stop   09/06/22 1600  vancomycin (VANCOCIN) capsule 125 mg        125 mg Oral 4 times daily 09/06/22 1054 09/13/22 1559   09/03/22 1315  vancomycin (VANCOCIN) capsule 125 mg  Status:  Discontinued        125 mg Oral 4 times daily 09/03/22 1301 09/06/22 1054        I have personally reviewed the following labs and images: CBC: Recent Labs  Lab 09/01/22 1547 09/02/22 1245 09/03/22 0058 09/04/22 0631 09/05/22 0001 09/06/22 0040  WBC 9.5 8.7 11.6* 12.3* 10.6* 8.0  NEUTROABS 8.7* 8.0*  --   --   --   --   HGB 9.3* 9.2* 9.1*  8.6* 8.8* 8.2*  HCT 27.5* 28.3* 27.5* 26.6* 27.2* 25.0*  MCV 85.7 87.3 86.2 86.6 89.8 85.9  PLT 184.0 178 186 196 192 186   BMP &GFR Recent Labs  Lab 09/02/22 1804 09/02/22 2040 09/03/22 0825 09/04/22 0631 09/05/22 0001 09/06/22 0040 09/07/22 0045  NA 123*   < > 122* 124* 121* 123* 123*  K 3.9   < > 3.5 3.9 3.8 3.9 3.8  CL 91*   < > 89* 94* 95* 98 97*  CO2 19*   < > 20* 17* 16* 16* 18*  GLUCOSE 56*   < > 36* 119* 130* 97 90  BUN 60*   < > 62* 60* 55* 51* 49*  CREATININE 6.46*   < > 6.28* 6.04* 5.84* 5.12* 5.13*  CALCIUM 7.8*   < > 7.5* 7.4* 7.7* 7.5* 7.5*  MG 1.9  --  1.9 1.9 1.9 1.8  --   PHOS  --   --   --  3.5 3.2 2.8 2.8   < > = values in this interval not displayed.   Estimated Creatinine Clearance: 7.9 mL/min (A) (by C-G formula based on SCr of 5.13 mg/dL (H)). Liver & Pancreas: Recent Labs  Lab 09/01/22 1547 09/02/22 1245 09/03/22 0825 09/04/22 0631 09/05/22 0001 09/06/22 0040 09/07/22 0045  AST 33 30  --  29  --   --   --   ALT 10 12  --  12  --    --   --   ALKPHOS 409* 363*  --  362*  --   --   --   BILITOT 0.8 1.0  --  0.7  --   --   --   PROT 7.1 6.4*  --  6.1*  --   --   --   ALBUMIN 2.4* 1.8* 1.7* 1.6* 1.9* 1.7* 1.7*   No results for input(s): "LIPASE", "AMYLASE" in the last 168 hours. No results for input(s): "AMMONIA" in the last 168 hours. Diabetic: No results for input(s): "HGBA1C" in the last 72 hours. Recent Labs  Lab 09/06/22 1203 09/06/22 1535 09/06/22 2058 09/07/22 0630 09/07/22 1148  GLUCAP 95 82 95 72 88   Cardiac Enzymes: No results for input(s): "CKTOTAL", "CKMB", "CKMBINDEX", "TROPONINI" in the last 168 hours. Recent Labs    02/17/22 1622  PROBNP 4,983*   Coagulation Profile: No results for input(s): "INR", "PROTIME" in the last 168 hours. Thyroid Function Tests: No results for input(s): "TSH", "T4TOTAL", "FREET4", "T3FREE", "THYROIDAB" in the last 72 hours.  Lipid Profile: No results for input(s): "CHOL", "HDL", "LDLCALC", "TRIG", "CHOLHDL", "LDLDIRECT" in the last 72 hours. Anemia Panel: No results for input(s): "VITAMINB12", "FOLATE", "FERRITIN", "TIBC", "IRON", "RETICCTPCT" in the last 72 hours.  Urine analysis:    Component Value Date/Time   COLORURINE YELLOW 09/02/2022 2137   APPEARANCEUR CLOUDY (A) 09/02/2022 2137   LABSPEC 1.011 09/02/2022 2137   PHURINE 5.0 09/02/2022 2137   GLUCOSEU NEGATIVE 09/02/2022 2137   GLUCOSEU NEGATIVE 02/27/2022 1237   HGBUR MODERATE (A) 09/02/2022 2137   BILIRUBINUR NEGATIVE 09/02/2022 2137   KETONESUR NEGATIVE 09/02/2022 2137   PROTEINUR 30 (A) 09/02/2022 2137   UROBILINOGEN 0.2 02/27/2022 1237   NITRITE NEGATIVE 09/02/2022 2137   LEUKOCYTESUR LARGE (A) 09/02/2022 2137   Sepsis Labs: Invalid input(s): "PROCALCITONIN", "LACTICIDVEN"  Microbiology: Recent Results (from the past 240 hour(s))  Gastrointestinal Panel by PCR , Stool     Status: None   Collection Time: 09/02/22  4:00 PM   Specimen: Stool  Result Value Ref Range Status    Campylobacter species NOT DETECTED NOT DETECTED Final   Plesimonas shigelloides NOT DETECTED NOT DETECTED Final   Salmonella species NOT DETECTED NOT DETECTED Final   Yersinia enterocolitica NOT DETECTED NOT DETECTED Final   Vibrio species NOT DETECTED NOT DETECTED Final   Vibrio cholerae NOT DETECTED NOT DETECTED Final   Enteroaggregative E coli (EAEC) NOT DETECTED NOT DETECTED Final   Enteropathogenic E coli (EPEC) NOT DETECTED NOT DETECTED Final   Enterotoxigenic E coli (ETEC) NOT DETECTED NOT DETECTED Final   Shiga like toxin producing E coli (STEC) NOT DETECTED NOT DETECTED Final   Shigella/Enteroinvasive E coli (EIEC) NOT DETECTED NOT DETECTED Final   Cryptosporidium NOT DETECTED NOT DETECTED Final   Cyclospora cayetanensis NOT DETECTED NOT DETECTED Final   Entamoeba histolytica NOT DETECTED NOT DETECTED Final   Giardia lamblia NOT DETECTED NOT DETECTED Final   Adenovirus F40/41 NOT DETECTED NOT DETECTED Final   Astrovirus NOT DETECTED NOT DETECTED Final   Norovirus GI/GII NOT DETECTED NOT DETECTED Final   Rotavirus A NOT DETECTED NOT DETECTED Final   Sapovirus (I, II, IV, and V) NOT DETECTED NOT DETECTED Final    Comment: Performed at [facility], [address] [place], [place] [address]  C Difficile Quick Screen w PCR reflex     Status: Abnormal   Collection Time: 09/03/22 11:22 AM   Specimen: STOOL  Result Value Ref Range Status   C Diff antigen POSITIVE (A) NEGATIVE Final   C Diff toxin POSITIVE (A) NEGATIVE Final    Comment: CRITICAL RESULT CALLED TO, READ BACK BY AND VERIFIED WITH:  [id number] RN CAMERON LEWIS    C Diff interpretation Toxin producing C. difficile detected.  Final    Comment: Performed at [facility], [place] [address], [place], [place] [address]    Radiology Studies: No results found.    Jaquail Mclees T. [place]  If 7PM-7AM, please contact night-coverage [email or web address] 09/07/2022, 12:23 PM

## 2022-09-08 ENCOUNTER — Other Ambulatory Visit (HOSPITAL_COMMUNITY): Payer: Self-pay

## 2022-09-08 ENCOUNTER — Telehealth: Payer: Self-pay | Admitting: *Deleted

## 2022-09-08 DIAGNOSIS — Q613 Polycystic kidney, unspecified: Secondary | ICD-10-CM | POA: Diagnosis not present

## 2022-09-08 DIAGNOSIS — H409 Unspecified glaucoma: Secondary | ICD-10-CM | POA: Diagnosis not present

## 2022-09-08 DIAGNOSIS — E871 Hypo-osmolality and hyponatremia: Secondary | ICD-10-CM | POA: Diagnosis not present

## 2022-09-08 DIAGNOSIS — N179 Acute kidney failure, unspecified: Secondary | ICD-10-CM | POA: Diagnosis not present

## 2022-09-08 LAB — MAGNESIUM: Magnesium: 1.7 mg/dL (ref 1.7–2.4)

## 2022-09-08 LAB — CBC
HCT: 28.7 % — ABNORMAL LOW (ref 39.0–52.0)
Hemoglobin: 9.4 g/dL — ABNORMAL LOW (ref 13.0–17.0)
MCH: 28.9 pg (ref 26.0–34.0)
MCHC: 32.8 g/dL (ref 30.0–36.0)
MCV: 88.3 fL (ref 80.0–100.0)
Platelets: 171 10*3/uL (ref 150–400)
RBC: 3.25 MIL/uL — ABNORMAL LOW (ref 4.22–5.81)
RDW: 16.6 % — ABNORMAL HIGH (ref 11.5–15.5)
WBC: 4.9 10*3/uL (ref 4.0–10.5)
nRBC: 0 % (ref 0.0–0.2)

## 2022-09-08 LAB — RENAL FUNCTION PANEL
Albumin: 1.7 g/dL — ABNORMAL LOW (ref 3.5–5.0)
Anion gap: 13 (ref 5–15)
BUN: 47 mg/dL — ABNORMAL HIGH (ref 8–23)
CO2: 17 mmol/L — ABNORMAL LOW (ref 22–32)
Calcium: 7.8 mg/dL — ABNORMAL LOW (ref 8.9–10.3)
Chloride: 94 mmol/L — ABNORMAL LOW (ref 98–111)
Creatinine, Ser: 4.89 mg/dL — ABNORMAL HIGH (ref 0.61–1.24)
GFR, Estimated: 11 mL/min — ABNORMAL LOW (ref >60)
Glucose, Bld: 81 mg/dL (ref 70–99)
Phosphorus: 3.3 mg/dL (ref 2.5–4.6)
Potassium: 4.1 mmol/L (ref 3.5–5.1)
Sodium: 124 mmol/L — ABNORMAL LOW (ref 135–145)

## 2022-09-08 LAB — GLUCOSE, CAPILLARY: Glucose-Capillary: 76 mg/dL (ref 70–99)

## 2022-09-08 MED ORDER — LEVOTHYROXINE SODIUM 75 MCG PO TABS
75.0000 ug | ORAL_TABLET | Freq: Every day | ORAL | 1 refills | Status: DC
  Filled 2022-09-08: qty 90, 90d supply, fill #0

## 2022-09-08 MED ORDER — VANCOMYCIN HCL 125 MG PO CAPS
125.0000 mg | ORAL_CAPSULE | Freq: Four times a day (QID) | ORAL | 0 refills | Status: AC
  Filled 2022-09-08: qty 24, 6d supply, fill #0

## 2022-09-08 MED ORDER — ISOSORB DINITRATE-HYDRALAZINE 20-37.5 MG PO TABS
1.0000 | ORAL_TABLET | Freq: Two times a day (BID) | ORAL | 2 refills | Status: DC
  Filled 2022-09-08: qty 60, 30d supply, fill #0

## 2022-09-08 MED ORDER — SODIUM CHLORIDE 1 G PO TABS
1.0000 g | ORAL_TABLET | Freq: Three times a day (TID) | ORAL | 0 refills | Status: AC
  Filled 2022-09-08: qty 30, 10d supply, fill #0

## 2022-09-08 NOTE — Discharge Summary (Signed)
Physician Discharge Summary  Rick [name] [id number] DOB: [birth year] DOA: 09/02/2022  PCP: Tonia Ghent, MD  Admit date: 09/02/2022 Discharge date: 09/08/2022 Admitted From: Home Disposition: Home Recommendations for Outpatient Follow-up:  Follow up with PCP and nephrology in 1 to 2 weeks Check BMP and CBC at follow-up Synthroid increased.  Check TSH in 4 to 6 weeks. Please follow up on the following pending results: None  Home Health: PT/OT/RN Equipment/Devices: Patient has appropriate DME's  Discharge Condition: Stable but guarded prognosis CODE STATUS: DNR/DNI  Follow-up Information     Tonia Ghent, MD Follow up on 09/11/2022.   Specialty: Family Medicine Why: 2:45 for hospital follow up Contact information: [place] [address] [phone]         Care, [facility] Follow up.   Specialty: Home Health Services Why: Agency will call you to set up apt times Contact information: [place] [address] [phone]                 Hospital course 89 year old M with PMH of combined CHF, MR, CKD-5, PCKD, hyponatremia, GIB, prostate cancer, BPH and hypertension directed to ED by PCP for hyponatremia.  See in by PCP for generalized weakness, diarrhea and shortness of breath.  Lab work significant for hyponatremia to 190 and AKI.     In ED, stable vitals. Na 121. Cr 6.42 (baseline 5.8).  BUN 60.  K3.2.  GIB ordered.  Hyponatremia felt to be hypervolemic.  Initially started on IV Lasix and fluid restriction.  However, with ongoing diarrhea.  Diuretics and fluid restriction lifted and started on IV fluid.   C. difficile panel positive.  Started on p.o. vancomycin.  Diarrhea and AKI seems to have resolved.  Hyponatremia persisted after initial improvement.  Started on p.o. sodium chloride and fluid restriction.  Sodium improved to 124.  Baseline about 130.   On the day of  discharge, patient felt well.  No further diarrhea.  Eager to go home.  Discharged on p.o. vancomycin for additional 6 days and p.o. sodium chloride for the next 10 days.  Advised on fluid restriction to less than 1200 cc a day.  Follow-up with PCP and nephrology outpatient.  Given his age, frailty and comorbidities, goal of care discussion initiated on palliative medicine consulted.  He is now DNR/DNI.  Assessment and plan discussed with patient's daughter wife at bedside  See individual problem list below for more.   Problems addressed during this hospitalization Principal Problem:   Hyponatremia Active Problems:   Polycystic kidney disease   Glaucoma   Essential hypertension   Hypothyroidism   HYPERCHOLESTEROLEMIA   GERD without esophagitis   History of prostate cancer   Anemia due to stage 4 chronic kidney disease   BPH (benign prostatic hyperplasia)   Acute kidney injury superimposed on chronic kidney disease   Clostridioides difficile infection   Goals of care, counseling/discussion   Hypoglycemia   DNR (do not resuscitate)   HFrEF (heart failure with reduced ejection fraction)   Palliative care by specialist   C. difficile diarrhea: C. difficile testing positive.  Diarrhea and leukocytosis resolved. -Discharged on p.o. vancomycin for additional 6 days to complete a total of 10 days course   AKI on CKD-5/hx of PCKD: Baseline Cr 5.5-5.7. Cr improved to 4.9.    Hyponatremia: Hyponatremia improved to 123 with IV fluid and persisted.  Eventually discontinued IV fluid and started on  p.o. sodium chloride and fluid restriction.  Further improvement to 124.  Baseline is 130.  Patient is not on a candidate for Samsca due to low creatinine clearance. -P.o. sodium chloride 1 g 3 times daily for 10 days -Continue p.o. Lasix -Advised fluid restriction to less than 1200 cc daily   Chronic combined systolic and diastolic CHF: Doubt acute exacerbation.  BNP elevation likely chronic and  from renal failure.  No cardiopulmonary symptoms.  Edema improved. -Resume Lasix on discharge   Hypothyroidism: TSH elevated to 27.2. -Increased home Synthroid from 50 to 75 mcg -Recheck TSH in 4 to 6 weeks outpatient   Hypoglycemia: Likely due to diarrhea and poor p.o. intake.  Resolved.   Metabolic acidosis: Likely due to renal failure and diarrhea.   GERD -Continue home PPI   Hypertension: Normotensive. -Continue home meds    BPH -Continue home tamsulosin   Goal of care counseling: DNR/DNI. -Appreciate help by palliative medicine   Hypoalbuminemia: Received IV albumin.           Time spent 45 minutes  Vital signs Vitals:   09/08/22 0024 09/08/22 0441 09/08/22 0500 09/08/22 0800  BP: 122/88 130/78  133/84  Pulse: 64 62  70  Temp: 97.6 F (36.4 C) 97.7 F (36.5 C)  97.7 F (36.5 C)  Resp: 19 18  16   Height:      Weight:   63.6 kg   SpO2: 99% 99%  97%  TempSrc: Oral Oral  Oral  BMI (Calculated):   20.7      Discharge exam  GENERAL: Appears frail.  No apparent distress. HEENT: MMM.  Vision and hearing grossly intact.  NECK: Supple.  No apparent JVD.  RESP:  No IWOB.  Fair aeration bilaterally. CVS:  RRR. Heart sounds normal.  ABD/GI/GU: BS+. Abd slightly distended but nontender. MSK/EXT:  Moves extremities. No apparent deformity.  Trace BLE edema. SKIN: no apparent skin lesion or wound NEURO: Awake and alert. Oriented appropriately.  No apparent focal neuro deficit. PSYCH: Calm. Normal affect.   Discharge Instructions Discharge Instructions     AMB Referral to [place] (ACO Patients)   Complete by: As directed    Hospital Follow up referral/Care Coordination:   High risk less than 30 day readmission, referred for readmission prevention  Primary Care Provider:  Tonia Ghent, MD, [place] at [place] Medicare  Please assign to [place] Coordinator for post hospital care coordination for  readmission prevention follow up calls and assess for further needs.  Questions please call:   Natividad Brood, RN BSN [place]  [phone] business mobile phone Toll free office [phone]  *[place]  [phone] Fax number: [phone] [email or web address]   Reason for Referral: Care Coordination ([facility]O patients)   Disease managment services needed: Nurse Case Manager   Diagnoses of:  Kidney Failure Other Heart Failure     Other Diagnosis: cdiff   Expected date of contact: Emergent - 3 Days   Call MD for:  difficulty breathing, headache or visual disturbances   Complete by: As directed    Call MD for:  extreme fatigue   Complete by: As directed    Call MD for:  persistant dizziness or light-headedness   Complete by: As directed    Diet - low sodium heart healthy   Complete by: As directed    Discharge instructions   Complete by: As directed    It has  been a pleasure taking care of you!  You were hospitalized due to diarrhea from infection, low sodium level and worsening kidney function.  Diarrhea has resolved.  We have started you on antibiotic for infection.  We are discharging you more antibiotics to complete treatment course.  Your sodium and kidney function has improved.  We have made adjustment to your home medication during this hospitalization.  Please review your new medication list and the directions on your medications before you take them.  Follow-up with your primary care doctor and nephrologist in 1 to 2 weeks or sooner if needed.  We recommend fluid restriction to less than 1200 cc a day.    Take care,   Increase activity slowly   Complete by: As directed    No wound care   Complete by: As directed       Allergies as of 09/08/2022       Reactions   Lisinopril    cough   Nsaids    Elevated Cr        Medication List     TAKE these medications     acetaminophen 325 MG tablet Commonly known as: TYLENOL Take 2 tablets (650 mg total) by mouth every 6 (six) hours as needed. What changed: reasons to take this   atorvastatin 10 MG tablet Commonly known as: LIPITOR Take 1 tablet (10 mg total) by mouth daily.   ferrous sulfate 325 (65 FE) MG tablet Take 1 tablet (325 mg total) by mouth daily with breakfast.   furosemide 40 MG tablet Commonly known as: LASIX Take 1 tablet (40 mg total) by mouth daily.   isosorbide-hydrALAZINE 20-37.5 MG tablet Commonly known as: BIDIL Take 1 tablet by mouth 2 (two) times daily.   latanoprost 0.005 % ophthalmic solution Commonly known as: XALATAN Place 1 drop into both eyes at bedtime.   levothyroxine 75 MCG tablet Commonly known as: SYNTHROID Take 1 tablet (75 mcg total) by mouth daily. Take on empty stomach early in the morning. What changed:  medication strength how much to take   metoprolol succinate 25 MG 24 hr tablet Commonly known as: TOPROL-XL Take 0.5 tablets (12.5 mg total) by mouth daily.   pantoprazole 40 MG tablet Commonly known as: Protonix Take 1 tablet (40 mg total) by mouth 2 (two) times daily before a meal.   sodium chloride 1 g tablet Take 1 tablet (1 g total) by mouth 3 (three) times daily with meals for 10 days.   tamsulosin 0.4 MG Caps capsule Commonly known as: FLOMAX Take 0.4 mg by mouth daily.   timolol 0.5 % ophthalmic solution Commonly known as: TIMOPTIC Place 1 drop into both eyes every morning.   vancomycin 125 MG capsule Commonly known as: VANCOCIN Take 1 capsule (125 mg total) by mouth 4 (four) times daily for 6 days.        Consultations: Palliative medicine  Procedures/Studies:   DG Chest Portable 1 View  Result Date: 09/02/2022 CLINICAL DATA:  Shortness of breath EXAM: PORTABLE CHEST 1 VIEW COMPARISON:  07/23/2022 FINDINGS: Cardiomegaly. Unchanged diffuse bilateral interstitial opacity. The visualized skeletal structures are  unremarkable. IMPRESSION: Cardiomegaly with unchanged diffuse bilateral interstitial opacity, likely edema. No new or focal airspace opacity. Electronically Signed   By: Delanna Ahmadi M.D.   On: 09/02/2022 14:10       The results of significant diagnostics from this hospitalization (including imaging, microbiology, ancillary and laboratory) are listed below for reference.     Microbiology: Recent Results (from  the past 240 hour(s))  Gastrointestinal Panel by PCR , Stool     Status: None   Collection Time: 09/02/22  4:00 PM   Specimen: Stool  Result Value Ref Range Status   Campylobacter species NOT DETECTED NOT DETECTED Final   Plesimonas shigelloides NOT DETECTED NOT DETECTED Final   Salmonella species NOT DETECTED NOT DETECTED Final   Yersinia enterocolitica NOT DETECTED NOT DETECTED Final   Vibrio species NOT DETECTED NOT DETECTED Final   Vibrio cholerae NOT DETECTED NOT DETECTED Final   Enteroaggregative E coli (EAEC) NOT DETECTED NOT DETECTED Final   Enteropathogenic E coli (EPEC) NOT DETECTED NOT DETECTED Final   Enterotoxigenic E coli (ETEC) NOT DETECTED NOT DETECTED Final   Shiga like toxin producing E coli (STEC) NOT DETECTED NOT DETECTED Final   Shigella/Enteroinvasive E coli (EIEC) NOT DETECTED NOT DETECTED Final   Cryptosporidium NOT DETECTED NOT DETECTED Final   Cyclospora cayetanensis NOT DETECTED NOT DETECTED Final   Entamoeba histolytica NOT DETECTED NOT DETECTED Final   Giardia lamblia NOT DETECTED NOT DETECTED Final   Adenovirus F40/41 NOT DETECTED NOT DETECTED Final   Astrovirus NOT DETECTED NOT DETECTED Final   Norovirus GI/GII NOT DETECTED NOT DETECTED Final   Rotavirus A NOT DETECTED NOT DETECTED Final   Sapovirus (I, II, IV, and V) NOT DETECTED NOT DETECTED Final    Comment: Performed at [facility], [address] [place], [place] [address]  C Difficile Quick Screen w PCR reflex     Status: Abnormal   Collection Time: 09/03/22 11:22 AM    Specimen: STOOL  Result Value Ref Range Status   C Diff antigen POSITIVE (A) NEGATIVE Final   C Diff toxin POSITIVE (A) NEGATIVE Final    Comment: CRITICAL RESULT CALLED TO, READ BACK BY AND VERIFIED WITH:  [id number] RN CAMERON LEWIS    C Diff interpretation Toxin producing C. difficile detected.  Final    Comment: Performed at [facility], [place] [address], [place], [place] [address]     Labs:  CBC: Recent Labs  Lab 09/02/22 1245 09/03/22 0058 09/04/22 0631 09/05/22 0001 09/06/22 0040 09/08/22 0101  WBC 8.7 11.6* 12.3* 10.6* 8.0 4.9  NEUTROABS 8.0*  --   --   --   --   --   HGB 9.2* 9.1* 8.6* 8.8* 8.2* 9.4*  HCT 28.3* 27.5* 26.6* 27.2* 25.0* 28.7*  MCV 87.3 86.2 86.6 89.8 85.9 88.3  PLT 178 186 196 192 186 171   BMP &GFR Recent Labs  Lab 09/03/22 0825 09/04/22 0631 09/05/22 0001 09/06/22 0040 09/07/22 0045 09/08/22 0101  NA 122* 124* 121* 123* 123* 124*  K 3.5 3.9 3.8 3.9 3.8 4.1  CL 89* 94* 95* 98 97* 94*  CO2 20* 17* 16* 16* 18* 17*  GLUCOSE 36* 119* 130* 97 90 81  BUN 62* 60* 55* 51* 49* 47*  CREATININE 6.28* 6.04* 5.84* 5.12* 5.13* 4.89*  CALCIUM 7.5* 7.4* 7.7* 7.5* 7.5* 7.8*  MG 1.9 1.9 1.9 1.8  --  1.7  PHOS  --  3.5 3.2 2.8 2.8 3.3   Estimated Creatinine Clearance: 8.9 mL/min (A) (by C-G formula based on SCr of 4.89 mg/dL (H)). Liver & Pancreas: Recent Labs  Lab 09/02/22 1245 09/03/22 0825 09/04/22 0631 09/05/22 0001 09/06/22 0040 09/07/22 0045 09/08/22 0101  AST 30  --  29  --   --   --   --   ALT 12  --  12  --   --   --   --  ALKPHOS 363*  --  362*  --   --   --   --   BILITOT 1.0  --  0.7  --   --   --   --   PROT 6.4*  --  6.1*  --   --   --   --   ALBUMIN 1.8*   < > 1.6* 1.9* 1.7* 1.7* 1.7*   < > = values in this interval not displayed.   No results for input(s): "LIPASE", "AMYLASE" in the last 168 hours. No results for input(s): "AMMONIA" in the last 168 hours. Diabetic: No results for input(s): "HGBA1C" in the last 72  hours. Recent Labs  Lab 09/07/22 0630 09/07/22 1148 09/07/22 1555 09/07/22 2057 09/08/22 0623  GLUCAP 72 88 90 85 76   Cardiac Enzymes: No results for input(s): "CKTOTAL", "CKMB", "CKMBINDEX", "TROPONINI" in the last 168 hours. Recent Labs    02/17/22 1622  PROBNP 4,983*   Coagulation Profile: No results for input(s): "INR", "PROTIME" in the last 168 hours. Thyroid Function Tests: No results for input(s): "TSH", "T4TOTAL", "FREET4", "T3FREE", "THYROIDAB" in the last 72 hours. Lipid Profile: No results for input(s): "CHOL", "HDL", "LDLCALC", "TRIG", "CHOLHDL", "LDLDIRECT" in the last 72 hours. Anemia Panel: No results for input(s): "VITAMINB12", "FOLATE", "FERRITIN", "TIBC", "IRON", "RETICCTPCT" in the last 72 hours. Urine analysis:    Component Value Date/Time   COLORURINE YELLOW 09/02/2022 2137   APPEARANCEUR CLOUDY (A) 09/02/2022 2137   LABSPEC 1.011 09/02/2022 2137   PHURINE 5.0 09/02/2022 2137   GLUCOSEU NEGATIVE 09/02/2022 2137   GLUCOSEU NEGATIVE 02/27/2022 1237   HGBUR MODERATE (A) 09/02/2022 2137   BILIRUBINUR NEGATIVE 09/02/2022 2137   KETONESUR NEGATIVE 09/02/2022 2137   PROTEINUR 30 (A) 09/02/2022 2137   UROBILINOGEN 0.2 02/27/2022 1237   NITRITE NEGATIVE 09/02/2022 2137   LEUKOCYTESUR LARGE (A) 09/02/2022 2137   Sepsis Labs: Invalid input(s): "PROCALCITONIN", "LACTICIDVEN"   SIGNED:  Mercy Riding, MD  [facility] 09/08/2022, 4:32 PM

## 2022-09-08 NOTE — TOC Transition Note (Signed)
Transition of Care [facility]) - CM/SW Discharge Note   Patient Details  Name: Rick [name] MRN: [id number] Date of Birth: 12-08-1928  [facility] CM/SW Contact:  Zenon Mayo, RN Phone Number: 09/08/2022, 8:41 AM   Clinical Narrative:    Patient is for dc today, wife is at the bedside to transport him home, he has his cane in the room.  NCM notified [facility] of dc.       Barriers to Discharge: Continued Medical Work up   Patient Goals and CMS Choice CMS Medicare.gov Compare Post Acute Care list provided to:: Patient Choice offered to / list presented to : Patient  Discharge Placement                         Discharge Plan and Services Additional resources added to the After Visit Summary for     Discharge Planning Services: CM Consult Post Acute Care Choice: Home Health          DME Arranged: N/A DME Agency: NA       HH Arranged: RN, PT, OT HH Agency: [place] Date [facility] Agency Contacted: 09/03/22 Time [place]: 1611 Representative spoke with at [place]: [place] ([place]) Interventions SDOH Screenings   Food Insecurity: No Food Insecurity (09/03/2022)  Housing: Low Risk  (09/03/2022)  Transportation Needs: No Transportation Needs (09/03/2022)  Utilities: Not At Risk (09/03/2022)  Alcohol Screen: Low Risk  (11/27/2021)  Depression (PHQ2-9): Low Risk  (09/01/2022)  Financial Resource Strain: Low Risk  (11/27/2021)  Physical Activity: Insufficiently Active (11/27/2021)  Social Connections: Socially Integrated (11/27/2021)  Stress: No Stress Concern Present (11/27/2021)  Tobacco Use: Medium Risk (09/02/2022)     Readmission Risk Interventions    09/03/2022    4:07 PM 07/25/2022    4:19 PM  Readmission Risk Prevention Plan  Transportation Screening Complete Complete  PCP or Specialist Appt within 3-5 Days  Complete  HRI or [place]  Not Complete  HRI or Home Care Consult comments   need pt eval  Social Work Consult for [place] Planning/Counseling  Not Complete  SW consult not completed comments  need pt eval  Palliative Care Screening  Not Applicable  Medication Review Press photographer) Complete Complete  PCP or Specialist appointment within 3-5 days of discharge Complete   HRI or [place] Complete   [place] Not Applicable

## 2022-09-08 NOTE — Progress Notes (Signed)
Discharge instructions (including medications) discussed with patient and family and copy provided to patient/caregiver

## 2022-09-08 NOTE — Plan of Care (Signed)
Problem: Education: Goal: Knowledge of General Education information will improve Description: Including pain rating scale, medication(s)/side effects and non-pharmacologic comfort measures Outcome: Adequate for Discharge   Problem: Health Behavior/Discharge Planning: Goal: Ability to manage health-related needs will improve Outcome: Adequate for Discharge   Problem: Clinical Measurements: Goal: Ability to maintain clinical measurements within normal limits will improve Outcome: Adequate for Discharge Goal: Will remain free from infection Outcome: Adequate for Discharge Goal: Diagnostic test results will improve Outcome: Adequate for Discharge Goal: Respiratory complications will improve Outcome: Adequate for Discharge Goal: Cardiovascular complication will be avoided Outcome: Adequate for Discharge   Problem: Activity: Goal: Risk for activity intolerance will decrease Outcome: Adequate for Discharge   Problem: Nutrition: Goal: Adequate nutrition will be maintained Outcome: Adequate for Discharge   Problem: Coping: Goal: Level of anxiety will decrease Outcome: Adequate for Discharge   Problem: Elimination: Goal: Will not experience complications related to bowel motility Outcome: Adequate for Discharge Goal: Will not experience complications related to urinary retention Outcome: Adequate for Discharge   Problem: Pain Managment: Goal: General experience of comfort will improve Outcome: Adequate for Discharge   Problem: Safety: Goal: Ability to remain free from injury will improve Outcome: Adequate for Discharge   Problem: Skin Integrity: Goal: Risk for impaired skin integrity will decrease Outcome: Adequate for Discharge   Problem: Education: Goal: Knowledge of General Education information will improve Description: Including pain rating scale, medication(s)/side effects and non-pharmacologic comfort measures Outcome: Adequate for Discharge   Problem: Health  Behavior/Discharge Planning: Goal: Ability to manage health-related needs will improve Outcome: Adequate for Discharge   Problem: Clinical Measurements: Goal: Ability to maintain clinical measurements within normal limits will improve Outcome: Adequate for Discharge Goal: Will remain free from infection Outcome: Adequate for Discharge Goal: Diagnostic test results will improve Outcome: Adequate for Discharge Goal: Respiratory complications will improve Outcome: Adequate for Discharge Goal: Cardiovascular complication will be avoided Outcome: Adequate for Discharge   Problem: Activity: Goal: Risk for activity intolerance will decrease Outcome: Adequate for Discharge   Problem: Nutrition: Goal: Adequate nutrition will be maintained Outcome: Adequate for Discharge   Problem: Coping: Goal: Level of anxiety will decrease Outcome: Adequate for Discharge   Problem: Elimination: Goal: Will not experience complications related to bowel motility Outcome: Adequate for Discharge Goal: Will not experience complications related to urinary retention Outcome: Adequate for Discharge   Problem: Pain Managment: Goal: General experience of comfort will improve Outcome: Adequate for Discharge   Problem: Safety: Goal: Ability to remain free from injury will improve Outcome: Adequate for Discharge   Problem: Skin Integrity: Goal: Risk for impaired skin integrity will decrease Outcome: Adequate for Discharge   Problem: Education: Goal: Ability to demonstrate management of disease process will improve Outcome: Adequate for Discharge Goal: Ability to verbalize understanding of medication therapies will improve Outcome: Adequate for Discharge Goal: Individualized Educational Video(s) Outcome: Adequate for Discharge   Problem: Activity: Goal: Capacity to carry out activities will improve Outcome: Adequate for Discharge   Problem: Cardiac: Goal: Ability to achieve and maintain adequate  cardiopulmonary perfusion will improve Outcome: Adequate for Discharge   Problem: Acute Rehab OT Goals (only OT should resolve) Goal: Pt. Will Perform Grooming Outcome: Adequate for Discharge Goal: Pt. Will Perform Upper Body Bathing Outcome: Adequate for Discharge Goal: Pt. Will Perform Upper Body Dressing Outcome: Adequate for Discharge Goal: Pt. Will Perform Lower Body Dressing Outcome: Adequate for Discharge Goal: Pt. Will Transfer To Toilet Outcome: Adequate for Discharge   Problem: Acute Rehab PT Goals(only PT  should resolve) Goal: Pt Will Go Supine/Side To Sit Outcome: Adequate for Discharge Goal: Pt Will Go Sit To Supine/Side Outcome: Adequate for Discharge Goal: Patient Will Transfer Sit To/From Stand Outcome: Adequate for Discharge Goal: Pt Will Ambulate Outcome: Adequate for Discharge

## 2022-09-08 NOTE — Progress Notes (Unsigned)
  Care Coordination  Outreach Note  09/08/2022 Name: KRISHON LEITZKE MRN: [id number] DOB: Oct 08, 1928   Care Coordination Outreach Attempts: An unsuccessful telephone outreach was attempted today to offer the patient information about available care coordination services as a benefit of their health plan.   Follow Up Plan:  Additional outreach attempts will be made to offer the patient care coordination information and services.   Encounter Outcome:  No Answer  Sig Julian Hy, [place] Direct Dial: [phone]

## 2022-09-09 ENCOUNTER — Telehealth: Payer: Self-pay | Admitting: *Deleted

## 2022-09-09 NOTE — Transitions of Care (Post Inpatient/ED Visit) (Signed)
   09/09/2022  Name: Rick [name] MRN: [id number] DOB: 1928/11/13  Today's TOC FU Call Status: Today's TOC FU Call Status:: Unsuccessful Call (2nd Attempt) Unsuccessful Call (2nd Attempt) Date: 09/09/22  Attempted to reach the patient regarding the most recent Inpatient/ED visit.  Follow Up Plan: Additional outreach attempts will be made to reach the patient to complete the Transitions of Care (Post Inpatient/ED visit) call.   [place] Care Management [phone]

## 2022-09-09 NOTE — Transitions of Care (Post Inpatient/ED Visit) (Signed)
   09/09/2022  Name: Rick [name] MRN: [id number] DOB: 1929-06-05  Today's TOC FU Call Status: Today's TOC FU Call Status:: Unsuccessul Call (1st Attempt) Unsuccessful Call (1st Attempt) Date: 09/09/22  Attempted to reach the patient regarding the most recent Inpatient/ED visit.  Follow Up Plan: Additional outreach attempts will be made to reach the patient to complete the Transitions of Care (Post Inpatient/ED visit) call.   [place] Care Management [phone]

## 2022-09-10 ENCOUNTER — Telehealth: Payer: Self-pay | Admitting: *Deleted

## 2022-09-10 DIAGNOSIS — D631 Anemia in chronic kidney disease: Secondary | ICD-10-CM | POA: Diagnosis not present

## 2022-09-10 DIAGNOSIS — D62 Acute posthemorrhagic anemia: Secondary | ICD-10-CM | POA: Diagnosis not present

## 2022-09-10 DIAGNOSIS — E039 Hypothyroidism, unspecified: Secondary | ICD-10-CM | POA: Diagnosis not present

## 2022-09-10 DIAGNOSIS — N184 Chronic kidney disease, stage 4 (severe): Secondary | ICD-10-CM | POA: Diagnosis not present

## 2022-09-10 DIAGNOSIS — I130 Hypertensive heart and chronic kidney disease with heart failure and stage 1 through stage 4 chronic kidney disease, or unspecified chronic kidney disease: Secondary | ICD-10-CM | POA: Diagnosis not present

## 2022-09-10 DIAGNOSIS — K317 Polyp of stomach and duodenum: Secondary | ICD-10-CM | POA: Diagnosis not present

## 2022-09-10 DIAGNOSIS — K31819 Angiodysplasia of stomach and duodenum without bleeding: Secondary | ICD-10-CM | POA: Diagnosis not present

## 2022-09-10 DIAGNOSIS — E785 Hyperlipidemia, unspecified: Secondary | ICD-10-CM | POA: Diagnosis not present

## 2022-09-10 DIAGNOSIS — I5043 Acute on chronic combined systolic (congestive) and diastolic (congestive) heart failure: Secondary | ICD-10-CM | POA: Diagnosis not present

## 2022-09-10 NOTE — Transitions of Care (Post Inpatient/ED Visit) (Signed)
   09/10/2022  Name: Rick [name] MRN: [id number] DOB: [birth year]  Today's TOC FU Call Status: Today's TOC FU Call Status:: Successful TOC FU Call Competed TOC FU Call Complete Date: 09/10/22  Transition Care Management Follow-up Telephone Call Date of Discharge: 09/08/22 Discharge Facility: [name]) Type of Discharge: Inpatient Admission Primary Inpatient Discharge Diagnosis:: hyponatremia How have you been since you were released from the hospital?: Better Any questions or concerns?: No  Items Reviewed: Did you receive and understand the discharge instructions provided?: Yes (Patient wife was not aware to make an appt with nephrology.) Medications obtained and verified?: Yes (Medications Reviewed) Any new allergies since your discharge?: No Dietary orders reviewed?: No Do you have support at home?: Yes People in Home: spouse Name of Support/Comfort Primary Source: [place] and Equipment/Supplies: [place] Ordered?: Yes Name of [place]:: [place] Has Agency set up a time to come to your home?: No EMR reviewed for [place] Orders: Orders present/patient has not received call (refer to CM for follow-up) (Wife will call because patient has some appointments and she wants them to come next week.) Any new equipment or medical supplies ordered?: No  Functional Questionnaire: Do you need assistance with bathing/showering or dressing?: Yes Do you need assistance with meal preparation?: Yes Do you need assistance with eating?: No Do you have difficulty maintaining continence: No Do you need assistance with getting out of bed/getting out of a chair/moving?: No Do you have difficulty managing or taking your medications?: Yes  Follow up appointments reviewed: PCP Follow-up appointment confirmed?: Yes Date of PCP follow-up appointment?: 09/11/22 Follow-up Provider: Dr Phillip Heal [facility] Follow-up appointment confirmed?:  No Reason Specialist Follow-Up Not Confirmed: Patient has Specialist Provider Number and will Call for Appointment (wife will call and set up an appointment) Do you need transportation to your follow-up appointment?: No Do you understand care options if your condition(s) worsen?: Yes-patient verbalized understanding  SDOH Interventions Today    Flowsheet Row Most Recent Value  SDOH Interventions   Food Insecurity Interventions Intervention Not Indicated  Housing Interventions Intervention Not Indicated  Transportation Interventions Intervention Not Indicated      Interventions Today    Flowsheet Row Most Recent Value  Chronic Disease   Chronic disease during today's visit Congestive Heart Failure (CHF)  General Interventions   General Interventions Discussed/Reviewed Referral to Nurse, Doctor Visits  [Patient referred to care coordination nurse Davina Green]  Doctor Visits Discussed/Reviewed Doctor Visits Discussed, Doctor Visits Reviewed, PCP, Specialist  PCP/Specialist Visits Compliance with follow-up visit  Exercise Interventions   Exercise Discussed/Reviewed Exercise Discussed, Exercise Reviewed  [RN discussed physical therapy and occupational therapy]       Patient scheduled with [place] Coordinator for call on 09/16/2022 3:00. [place] Care Management [phone]

## 2022-09-11 ENCOUNTER — Encounter: Payer: Self-pay | Admitting: Family Medicine

## 2022-09-11 ENCOUNTER — Ambulatory Visit: Payer: Medicare PPO | Admitting: Family Medicine

## 2022-09-11 VITALS — BP 120/64 | HR 72 | Temp 98.2°F

## 2022-09-11 DIAGNOSIS — N189 Chronic kidney disease, unspecified: Secondary | ICD-10-CM

## 2022-09-11 DIAGNOSIS — N179 Acute kidney failure, unspecified: Secondary | ICD-10-CM

## 2022-09-11 DIAGNOSIS — S61419A Laceration without foreign body of unspecified hand, initial encounter: Secondary | ICD-10-CM | POA: Diagnosis not present

## 2022-09-11 DIAGNOSIS — E039 Hypothyroidism, unspecified: Secondary | ICD-10-CM | POA: Diagnosis not present

## 2022-09-11 NOTE — Progress Notes (Signed)
Pt scheduled with RN by [facility] nurse

## 2022-09-11 NOTE — Progress Notes (Signed)
Inpatient course discussed with patient.  Chronic kidney disease at baseline admitted with hyponatremia and found to be C. difficile positive.  He was treated in the meantime.  He improved to the point where he could be discharged.  Still on vancomycin.  Diarrhea is clearly better.  Solid stools now.  No fevers.  No abdominal pain.  TSH elevation discussed with patient.  On increased dose of levothyroxine now.  Discussed rechecking TSH in about 6 weeks.  Discussed rationale for rechecking his labs today, given his kidney function and history of hyponatremia and anemia.  Discussed rationale for oral vancomycin use and that he may need a pulse dose/longer taper in the future.    Renal f/u pending for 09/19/22 with Dr. Thedore Mins.    Also noted that he scraped L hand when he fell at home yesterday.  No LOC.  In wheelchair.    Meds, vitals, and allergies reviewed.   ROS: Per HPI unless specifically indicated in ROS section   Nad Ncat Neck supple, no LA Rrr Ctab Abd soft, nontender. Skin well-perfused. He has a small superficial skin tear on the dorsum of left hand, near the fifth metacarpal.  This is clean appearing and superficial.  Cover with a nonstick bandage and Neosporin and wrapped with rolled gauze.  Tolerated well.  30 minutes were devoted to patient care in this encounter (this includes time spent reviewing the patient's file/history, interviewing and examining the patient, counseling/reviewing plan with patient).

## 2022-09-11 NOTE — Patient Instructions (Addendum)
If you start having diarrhea again after you finish the antibiotics then let me know.   Take care.  Glad to see you. Go to the lab on the way out.   If you have mychart we'll likely use that to update you.    Recheck TSH in about 6 weeks.

## 2022-09-12 ENCOUNTER — Telehealth: Payer: Self-pay

## 2022-09-12 DIAGNOSIS — K317 Polyp of stomach and duodenum: Secondary | ICD-10-CM | POA: Diagnosis not present

## 2022-09-12 DIAGNOSIS — I130 Hypertensive heart and chronic kidney disease with heart failure and stage 1 through stage 4 chronic kidney disease, or unspecified chronic kidney disease: Secondary | ICD-10-CM | POA: Diagnosis not present

## 2022-09-12 DIAGNOSIS — K31819 Angiodysplasia of stomach and duodenum without bleeding: Secondary | ICD-10-CM | POA: Diagnosis not present

## 2022-09-12 DIAGNOSIS — N184 Chronic kidney disease, stage 4 (severe): Secondary | ICD-10-CM | POA: Diagnosis not present

## 2022-09-12 DIAGNOSIS — D631 Anemia in chronic kidney disease: Secondary | ICD-10-CM | POA: Diagnosis not present

## 2022-09-12 DIAGNOSIS — S61419A Laceration without foreign body of unspecified hand, initial encounter: Secondary | ICD-10-CM | POA: Insufficient documentation

## 2022-09-12 DIAGNOSIS — D62 Acute posthemorrhagic anemia: Secondary | ICD-10-CM | POA: Diagnosis not present

## 2022-09-12 DIAGNOSIS — I5043 Acute on chronic combined systolic (congestive) and diastolic (congestive) heart failure: Secondary | ICD-10-CM | POA: Diagnosis not present

## 2022-09-12 DIAGNOSIS — E785 Hyperlipidemia, unspecified: Secondary | ICD-10-CM | POA: Diagnosis not present

## 2022-09-12 DIAGNOSIS — E039 Hypothyroidism, unspecified: Secondary | ICD-10-CM | POA: Diagnosis not present

## 2022-09-12 LAB — CBC WITH DIFFERENTIAL/PLATELET
Basophils Absolute: 0 10*3/uL (ref 0.0–0.1)
Basophils Relative: 0.5 % (ref 0.0–3.0)
Eosinophils Absolute: 0.1 10*3/uL (ref 0.0–0.7)
Eosinophils Relative: 2.1 % (ref 0.0–5.0)
HCT: 23.3 % — CL (ref 39.0–52.0)
Hemoglobin: 8 g/dL — CL (ref 13.0–17.0)
Lymphocytes Relative: 6.3 % — ABNORMAL LOW (ref 12.0–46.0)
Lymphs Abs: 0.3 10*3/uL — ABNORMAL LOW (ref 0.7–4.0)
MCHC: 34.5 g/dL (ref 30.0–36.0)
MCV: 84.5 fl (ref 78.0–100.0)
Monocytes Absolute: 0.2 10*3/uL (ref 0.1–1.0)
Monocytes Relative: 3.7 % (ref 3.0–12.0)
Neutro Abs: 4.5 10*3/uL (ref 1.4–7.7)
Neutrophils Relative %: 87.4 % — ABNORMAL HIGH (ref 43.0–77.0)
Platelets: 163 10*3/uL (ref 150.0–400.0)
RBC: 2.76 Mil/uL — ABNORMAL LOW (ref 4.22–5.81)
RDW: 17 % — ABNORMAL HIGH (ref 11.5–15.5)
WBC: 5.2 10*3/uL (ref 4.0–10.5)

## 2022-09-12 LAB — BASIC METABOLIC PANEL
BUN: 48 mg/dL — ABNORMAL HIGH (ref 6–23)
CO2: 17 mEq/L — ABNORMAL LOW (ref 19–32)
Calcium: 7.6 mg/dL — ABNORMAL LOW (ref 8.4–10.5)
Chloride: 98 mEq/L (ref 96–112)
Creatinine, Ser: 5.03 mg/dL (ref 0.40–1.50)
GFR: 9.47 mL/min — CL (ref >60.00)
Glucose, Bld: 97 mg/dL (ref 70–99)
Potassium: 3.9 mEq/L (ref 3.5–5.1)
Sodium: 121 mEq/L — CL (ref 135–145)

## 2022-09-12 NOTE — Telephone Encounter (Signed)
His labs are similar to prior.  I would still finish the antibiotics as planned.  Please send a copy of his labs to the renal clinic for eval and follow up. I would continue with sodium chloride tabs and lasix as is.  Please see if he can have follow up with renal early next week.  If not, then let me know so we can see about rechecking his labs here.  If increasing SOB, then he'll need to be seen at ER but I hope that with continued treatment for C Diff he'll have stability/gradual improvement over the next few days.  Thanks.

## 2022-09-12 NOTE — Telephone Encounter (Addendum)
CRITICAL VALUE STICKER  CRITICAL VALUE: Na 121                                Creatinine 5.03                                GFR 9.47  RECEIVER (on-site recipient of call):Gerianne Simonet LPN  DATE & TIME NOTIFIED: 09/12/22 at 10:50 AM.  MESSENGER (representative from lab):Saa from [facility]  MD NOTIFIED: Dr Roxy Manns in Dr Lianne Bushy absence  TIME OF NOTIFICATION:10:55 AM  RESPONSE:    Message sent to Dr Milinda Antis and [facility] pool in Dr Lianne Bushy absence and will speak with [place] CMA. Report is also in Epic.

## 2022-09-12 NOTE — Telephone Encounter (Signed)
I reviewed partial note from Dr Para March and his hosp d/c summary  Na was 124 last  GFR remains low  He may be under palliative care as well   How is he feeling? Is he taking the salt tabs? Is he taking the lasix ? Any more diarrhea/ any vomiting  Copy of all labs to Dr Thedore Mins for his renal follow up appt please and make sure they see that today   Cc to pcp

## 2022-09-12 NOTE — Telephone Encounter (Signed)
CRITICAL VALUE STICKER  CRITICAL VALUE: Hemoglobin 8.0 and Hematocrit 23.3  RECEIVER (on-site recipient of call): Morrie Sheldon, RN  DATE & TIME NOTIFIED: 09/12/22 10:25 a  MESSENGER (representative from lab):Karen  MD NOTIFIED: Dr. Milinda Antis  TIME OF NOTIFICATION:10:35a  RESPONSE:    Message sent to Dr. Milinda Antis in Dr. Lianne Bushy absence.

## 2022-09-12 NOTE — Telephone Encounter (Signed)
Spoke with [name], wife; she reports that pt is eating well but is having SOB with ambulation.  Denies chest pain. He is taking both the lasix and sodium chloride as prescribed. She reports that his stool is now solid and brown.  She reports that he is having some blood on toilet tissue when wiping but is a small amount.

## 2022-09-12 NOTE — Assessment & Plan Note (Signed)
Local care in the meantime with nonstick bandage and update me as needed.

## 2022-09-12 NOTE — Telephone Encounter (Signed)
Agreed.  Thanks.  

## 2022-09-12 NOTE — Assessment & Plan Note (Signed)
Due to GI illness/C. difficile.  Diarrhea improved on vancomycin.  Continue vancomycin for now.  See notes on labs.  He is going to follow-up with the renal clinic.

## 2022-09-12 NOTE — Assessment & Plan Note (Signed)
Continue levothyroxine we can recheck TSH in about 6 weeks.

## 2022-09-12 NOTE — Telephone Encounter (Signed)
Routed labs to Dr. Thedore Mins at renal clinic and called about an appt for patient. Patient has appt 09/19/22 and this is the earliest available as Dr. Thedore Mins is not in the office until that day.   Called and notified patients wife of Dr. Lianne Bushy message. Advised they have earliest available appt with renal clinic. Advised we send Dr. Thedore Mins the recent labs. ER precautions given. Patients wife will keep [place] updated as needed.

## 2022-09-14 ENCOUNTER — Other Ambulatory Visit: Payer: Self-pay | Admitting: Family Medicine

## 2022-09-14 DIAGNOSIS — N189 Chronic kidney disease, unspecified: Secondary | ICD-10-CM

## 2022-09-15 ENCOUNTER — Telehealth: Payer: Self-pay | Admitting: Family Medicine

## 2022-09-15 DIAGNOSIS — N184 Chronic kidney disease, stage 4 (severe): Secondary | ICD-10-CM | POA: Diagnosis not present

## 2022-09-15 DIAGNOSIS — E039 Hypothyroidism, unspecified: Secondary | ICD-10-CM | POA: Diagnosis not present

## 2022-09-15 DIAGNOSIS — D62 Acute posthemorrhagic anemia: Secondary | ICD-10-CM | POA: Diagnosis not present

## 2022-09-15 DIAGNOSIS — I130 Hypertensive heart and chronic kidney disease with heart failure and stage 1 through stage 4 chronic kidney disease, or unspecified chronic kidney disease: Secondary | ICD-10-CM | POA: Diagnosis not present

## 2022-09-15 DIAGNOSIS — K317 Polyp of stomach and duodenum: Secondary | ICD-10-CM | POA: Diagnosis not present

## 2022-09-15 DIAGNOSIS — I5043 Acute on chronic combined systolic (congestive) and diastolic (congestive) heart failure: Secondary | ICD-10-CM | POA: Diagnosis not present

## 2022-09-15 DIAGNOSIS — D631 Anemia in chronic kidney disease: Secondary | ICD-10-CM | POA: Diagnosis not present

## 2022-09-15 DIAGNOSIS — K31819 Angiodysplasia of stomach and duodenum without bleeding: Secondary | ICD-10-CM | POA: Diagnosis not present

## 2022-09-15 DIAGNOSIS — E785 Hyperlipidemia, unspecified: Secondary | ICD-10-CM | POA: Diagnosis not present

## 2022-09-15 NOTE — Telephone Encounter (Signed)
Lynnae Sandhoff from [place] called in to report an alert on patient.She said that he has had a 5 pound weight gain,and she's concerned because  he has congested heart failure,and  Both of his hands are swollen.

## 2022-09-15 NOTE — Telephone Encounter (Signed)
Spoke with patients wife [name]. Patient is not having any SOB or diarrhea. Advised [name] to have patient to take an extra lasix and to call back in the morning with sx/weight.

## 2022-09-15 NOTE — Telephone Encounter (Signed)
If more SOB, then needs to go to ER.  Would take 1 extra dose of lasix today in the meantime.   Does he have diarrhea now? Please update [place] about symptoms/weight tomorrow AM.  Thanks.

## 2022-09-16 ENCOUNTER — Ambulatory Visit: Payer: Self-pay

## 2022-09-16 DIAGNOSIS — D631 Anemia in chronic kidney disease: Secondary | ICD-10-CM | POA: Diagnosis not present

## 2022-09-16 DIAGNOSIS — I5043 Acute on chronic combined systolic (congestive) and diastolic (congestive) heart failure: Secondary | ICD-10-CM | POA: Diagnosis not present

## 2022-09-16 DIAGNOSIS — D62 Acute posthemorrhagic anemia: Secondary | ICD-10-CM | POA: Diagnosis not present

## 2022-09-16 DIAGNOSIS — E785 Hyperlipidemia, unspecified: Secondary | ICD-10-CM | POA: Diagnosis not present

## 2022-09-16 DIAGNOSIS — I130 Hypertensive heart and chronic kidney disease with heart failure and stage 1 through stage 4 chronic kidney disease, or unspecified chronic kidney disease: Secondary | ICD-10-CM | POA: Diagnosis not present

## 2022-09-16 DIAGNOSIS — K317 Polyp of stomach and duodenum: Secondary | ICD-10-CM | POA: Diagnosis not present

## 2022-09-16 DIAGNOSIS — K31819 Angiodysplasia of stomach and duodenum without bleeding: Secondary | ICD-10-CM | POA: Diagnosis not present

## 2022-09-16 DIAGNOSIS — N184 Chronic kidney disease, stage 4 (severe): Secondary | ICD-10-CM | POA: Diagnosis not present

## 2022-09-16 DIAGNOSIS — E039 Hypothyroidism, unspecified: Secondary | ICD-10-CM | POA: Diagnosis not present

## 2022-09-16 NOTE — Telephone Encounter (Signed)
I would only give 1 dose of lasix today.  Will await his renal f/u note.  Thanks.

## 2022-09-16 NOTE — Patient Outreach (Signed)
  Care Coordination   09/16/2022 Name: Rick [name] MRN: [id number] DOB: 02-20-1929   Care Coordination Outreach Attempts:  An unsuccessful telephone outreach was attempted for a scheduled appointment today. Attempted call to listed home number for patient and cell number for spouse, Rick [name].  HIPAA compliant message left on  patients contact phone number. Unable to leave message on spouse voice mail due to mailbox being full.   Follow Up Plan:  Additional outreach attempts will be made to offer the patient care coordination information and services.   Encounter Outcome:  No Answer   Care Coordination Interventions:  No, not indicated    George Ina [facility] Care Coordination [phone] direct line

## 2022-09-16 NOTE — Telephone Encounter (Signed)
Called and let patients wife [name] know to only give 1 dose of lasix today. [name] verbalized understanding

## 2022-09-16 NOTE — Telephone Encounter (Signed)
Patient wife called in and stated that he is weighing 160 today, same as yesterday. Swelling has gone down in his hands. She was also wanting to know if she should give him two lasix again today. Thank you!

## 2022-09-17 ENCOUNTER — Telehealth: Payer: Self-pay | Admitting: Family Medicine

## 2022-09-17 DIAGNOSIS — D631 Anemia in chronic kidney disease: Secondary | ICD-10-CM | POA: Diagnosis not present

## 2022-09-17 DIAGNOSIS — I130 Hypertensive heart and chronic kidney disease with heart failure and stage 1 through stage 4 chronic kidney disease, or unspecified chronic kidney disease: Secondary | ICD-10-CM | POA: Diagnosis not present

## 2022-09-17 DIAGNOSIS — E039 Hypothyroidism, unspecified: Secondary | ICD-10-CM | POA: Diagnosis not present

## 2022-09-17 DIAGNOSIS — D62 Acute posthemorrhagic anemia: Secondary | ICD-10-CM | POA: Diagnosis not present

## 2022-09-17 DIAGNOSIS — N184 Chronic kidney disease, stage 4 (severe): Secondary | ICD-10-CM | POA: Diagnosis not present

## 2022-09-17 DIAGNOSIS — K31819 Angiodysplasia of stomach and duodenum without bleeding: Secondary | ICD-10-CM | POA: Diagnosis not present

## 2022-09-17 DIAGNOSIS — K317 Polyp of stomach and duodenum: Secondary | ICD-10-CM | POA: Diagnosis not present

## 2022-09-17 DIAGNOSIS — E785 Hyperlipidemia, unspecified: Secondary | ICD-10-CM | POA: Diagnosis not present

## 2022-09-17 DIAGNOSIS — I5043 Acute on chronic combined systolic (congestive) and diastolic (congestive) heart failure: Secondary | ICD-10-CM | POA: Diagnosis not present

## 2022-09-17 NOTE — Telephone Encounter (Signed)
Called patients wife and advised her to give patient his 2nd dose of lasix today. Patient has appt already to renal on Friday. Patient has not had anymore diarrhea and no SOB.

## 2022-09-17 NOTE — Telephone Encounter (Signed)
I think he'll need to take a 2nd dose of lasix today and then follow up with renal.  If more SOB, then needs ER eval.  If more diarrhea, then let me know.  Thanks.

## 2022-09-17 NOTE — Telephone Encounter (Signed)
Rosalita Chessman from [facility] contacting the office in regards to the patient's weight gain, wanted Dr. Para March to be aware of the weight change and that his hands are still puffy/swollen as well as lower extremities. If needed, advise [phone]  09/12/22- 155 lb 09/15/22- 160 lb 09/17/22- 164.4

## 2022-09-18 DIAGNOSIS — D62 Acute posthemorrhagic anemia: Secondary | ICD-10-CM | POA: Diagnosis not present

## 2022-09-18 DIAGNOSIS — E039 Hypothyroidism, unspecified: Secondary | ICD-10-CM | POA: Diagnosis not present

## 2022-09-18 DIAGNOSIS — N184 Chronic kidney disease, stage 4 (severe): Secondary | ICD-10-CM | POA: Diagnosis not present

## 2022-09-18 DIAGNOSIS — I5043 Acute on chronic combined systolic (congestive) and diastolic (congestive) heart failure: Secondary | ICD-10-CM | POA: Diagnosis not present

## 2022-09-18 DIAGNOSIS — E785 Hyperlipidemia, unspecified: Secondary | ICD-10-CM | POA: Diagnosis not present

## 2022-09-18 DIAGNOSIS — D631 Anemia in chronic kidney disease: Secondary | ICD-10-CM | POA: Diagnosis not present

## 2022-09-18 DIAGNOSIS — I130 Hypertensive heart and chronic kidney disease with heart failure and stage 1 through stage 4 chronic kidney disease, or unspecified chronic kidney disease: Secondary | ICD-10-CM | POA: Diagnosis not present

## 2022-09-18 DIAGNOSIS — K31819 Angiodysplasia of stomach and duodenum without bleeding: Secondary | ICD-10-CM | POA: Diagnosis not present

## 2022-09-18 DIAGNOSIS — K317 Polyp of stomach and duodenum: Secondary | ICD-10-CM | POA: Diagnosis not present

## 2022-09-19 DIAGNOSIS — E559 Vitamin D deficiency, unspecified: Secondary | ICD-10-CM | POA: Diagnosis not present

## 2022-09-19 DIAGNOSIS — N2581 Secondary hyperparathyroidism of renal origin: Secondary | ICD-10-CM | POA: Diagnosis not present

## 2022-09-19 DIAGNOSIS — N185 Chronic kidney disease, stage 5: Secondary | ICD-10-CM | POA: Diagnosis not present

## 2022-09-19 DIAGNOSIS — I5020 Unspecified systolic (congestive) heart failure: Secondary | ICD-10-CM | POA: Diagnosis not present

## 2022-09-19 DIAGNOSIS — Q613 Polycystic kidney, unspecified: Secondary | ICD-10-CM | POA: Diagnosis not present

## 2022-09-19 DIAGNOSIS — D631 Anemia in chronic kidney disease: Secondary | ICD-10-CM | POA: Diagnosis not present

## 2022-09-19 DIAGNOSIS — E871 Hypo-osmolality and hyponatremia: Secondary | ICD-10-CM | POA: Diagnosis not present

## 2022-09-19 DIAGNOSIS — I130 Hypertensive heart and chronic kidney disease with heart failure and stage 1 through stage 4 chronic kidney disease, or unspecified chronic kidney disease: Secondary | ICD-10-CM | POA: Diagnosis not present

## 2022-09-19 DIAGNOSIS — R609 Edema, unspecified: Secondary | ICD-10-CM | POA: Diagnosis not present

## 2022-09-22 ENCOUNTER — Telehealth: Payer: Self-pay | Admitting: *Deleted

## 2022-09-22 DIAGNOSIS — E785 Hyperlipidemia, unspecified: Secondary | ICD-10-CM | POA: Diagnosis not present

## 2022-09-22 DIAGNOSIS — D631 Anemia in chronic kidney disease: Secondary | ICD-10-CM | POA: Diagnosis not present

## 2022-09-22 DIAGNOSIS — I5043 Acute on chronic combined systolic (congestive) and diastolic (congestive) heart failure: Secondary | ICD-10-CM | POA: Diagnosis not present

## 2022-09-22 DIAGNOSIS — I130 Hypertensive heart and chronic kidney disease with heart failure and stage 1 through stage 4 chronic kidney disease, or unspecified chronic kidney disease: Secondary | ICD-10-CM | POA: Diagnosis not present

## 2022-09-22 DIAGNOSIS — K31819 Angiodysplasia of stomach and duodenum without bleeding: Secondary | ICD-10-CM | POA: Diagnosis not present

## 2022-09-22 DIAGNOSIS — N184 Chronic kidney disease, stage 4 (severe): Secondary | ICD-10-CM | POA: Diagnosis not present

## 2022-09-22 DIAGNOSIS — E039 Hypothyroidism, unspecified: Secondary | ICD-10-CM | POA: Diagnosis not present

## 2022-09-22 DIAGNOSIS — K317 Polyp of stomach and duodenum: Secondary | ICD-10-CM | POA: Diagnosis not present

## 2022-09-22 DIAGNOSIS — D62 Acute posthemorrhagic anemia: Secondary | ICD-10-CM | POA: Diagnosis not present

## 2022-09-22 NOTE — Progress Notes (Signed)
  Care Coordination Note  09/22/2022 Name: Rick [name] MRN: [id number] DOB: 1928/09/02  Rick [name] is a 89 y.o. year old male who is a primary care patient of Joaquim Nam, MD and is actively engaged with the care management team. I reached out to Henry Russel by phone today to assist with re-scheduling an initial visit with the RN Case Manager  Follow up plan: Telephone appointment with care management team member scheduled for: 09/29/2022  Burman Nieves, [facility] Care Coordination Care Guide Direct Dial: [phone]

## 2022-09-23 DIAGNOSIS — K317 Polyp of stomach and duodenum: Secondary | ICD-10-CM | POA: Diagnosis not present

## 2022-09-23 DIAGNOSIS — E785 Hyperlipidemia, unspecified: Secondary | ICD-10-CM | POA: Diagnosis not present

## 2022-09-23 DIAGNOSIS — I130 Hypertensive heart and chronic kidney disease with heart failure and stage 1 through stage 4 chronic kidney disease, or unspecified chronic kidney disease: Secondary | ICD-10-CM | POA: Diagnosis not present

## 2022-09-23 DIAGNOSIS — K31819 Angiodysplasia of stomach and duodenum without bleeding: Secondary | ICD-10-CM | POA: Diagnosis not present

## 2022-09-23 DIAGNOSIS — E039 Hypothyroidism, unspecified: Secondary | ICD-10-CM | POA: Diagnosis not present

## 2022-09-23 DIAGNOSIS — D62 Acute posthemorrhagic anemia: Secondary | ICD-10-CM | POA: Diagnosis not present

## 2022-09-23 DIAGNOSIS — D631 Anemia in chronic kidney disease: Secondary | ICD-10-CM | POA: Diagnosis not present

## 2022-09-23 DIAGNOSIS — I5043 Acute on chronic combined systolic (congestive) and diastolic (congestive) heart failure: Secondary | ICD-10-CM | POA: Diagnosis not present

## 2022-09-23 DIAGNOSIS — N184 Chronic kidney disease, stage 4 (severe): Secondary | ICD-10-CM | POA: Diagnosis not present

## 2022-09-24 DIAGNOSIS — D631 Anemia in chronic kidney disease: Secondary | ICD-10-CM | POA: Diagnosis not present

## 2022-09-24 DIAGNOSIS — K317 Polyp of stomach and duodenum: Secondary | ICD-10-CM | POA: Diagnosis not present

## 2022-09-24 DIAGNOSIS — E039 Hypothyroidism, unspecified: Secondary | ICD-10-CM | POA: Diagnosis not present

## 2022-09-24 DIAGNOSIS — I5043 Acute on chronic combined systolic (congestive) and diastolic (congestive) heart failure: Secondary | ICD-10-CM | POA: Diagnosis not present

## 2022-09-24 DIAGNOSIS — D62 Acute posthemorrhagic anemia: Secondary | ICD-10-CM | POA: Diagnosis not present

## 2022-09-24 DIAGNOSIS — E785 Hyperlipidemia, unspecified: Secondary | ICD-10-CM | POA: Diagnosis not present

## 2022-09-24 DIAGNOSIS — K31819 Angiodysplasia of stomach and duodenum without bleeding: Secondary | ICD-10-CM | POA: Diagnosis not present

## 2022-09-24 DIAGNOSIS — I130 Hypertensive heart and chronic kidney disease with heart failure and stage 1 through stage 4 chronic kidney disease, or unspecified chronic kidney disease: Secondary | ICD-10-CM | POA: Diagnosis not present

## 2022-09-24 DIAGNOSIS — N184 Chronic kidney disease, stage 4 (severe): Secondary | ICD-10-CM | POA: Diagnosis not present

## 2022-09-25 ENCOUNTER — Telehealth: Payer: Self-pay

## 2022-09-25 NOTE — Progress Notes (Signed)
Care Management & Coordination Services Pharmacy Team  Reason for Encounter: Appointment Reminder  Contacted patient to confirm telephone appointment with Al Corpus , PharmD on 09/30/22 at 10:00.  Patient reports weak in the legs and would prefer telephone visit  Spoke with caregiver on 09/25/2022  Spoke with wife [name] ( call home #)  Do you have any problems getting your medications? No   What is your top health concern you would like to discuss at your upcoming visit?  Reports leg weakness  09/25/22 Wt 166 Lbs.  Have you seen any other providers since your last visit with PCP? No   Chart review:  Recent office visits:  09/11/22-Graham Duncan,MD(PCP)-hospital f/u,Labs(phone note)-Continue vancomycin for now. Recheck TSH in about 6 weeks.  09/01/22-Marne [facility],MD(fam med)-diarrhea,labs( no notes) 08/21/22-Graham Duncan,MD(PCP)-hospial f/u,labs( Sodium low but similar to prev.  Cr similar to last week.  I wouldn't change his meds at this point). 06/19/22-Graham Duncan,MD(PCP)-hospital f/u,labs,( Hemoglobin is slightly better than a few weeks ago.  I would continue taking iron as is.  Recent consult visits:  07/16/22-Brandon Cain,MD(vas surg)- evaluate for fistula placement  06/27/22-Paula Ross,MD(cardio)-f/u HTN,CHF,labs ordered( no notes),f/u  5 months.   Hospital visits:  09/02/22 thru 09/08/22- [facility] - Hyponatremia 08/13/22 thru 08/16/22- [facility]- Upper GI bleed 07/23/22 thru 08/04/22- [facility]- SOB,weight gain,edema 05/31/22 thru 06/09/22-[facility] - Hypokalemia   Star Rating Drugs:  Medication:  Last Fill: Day Supply Atorvastatin 10mg  07/16/22  90   Care Gaps: Annual wellness visit in last year? Yes    Al Corpus, PharmD notified  Burt Knack, [facility] Clinical Pharmacy Assistant [phone]

## 2022-09-27 DIAGNOSIS — D631 Anemia in chronic kidney disease: Secondary | ICD-10-CM | POA: Diagnosis not present

## 2022-09-27 DIAGNOSIS — K317 Polyp of stomach and duodenum: Secondary | ICD-10-CM | POA: Diagnosis not present

## 2022-09-27 DIAGNOSIS — K31819 Angiodysplasia of stomach and duodenum without bleeding: Secondary | ICD-10-CM | POA: Diagnosis not present

## 2022-09-27 DIAGNOSIS — D62 Acute posthemorrhagic anemia: Secondary | ICD-10-CM | POA: Diagnosis not present

## 2022-09-27 DIAGNOSIS — I5043 Acute on chronic combined systolic (congestive) and diastolic (congestive) heart failure: Secondary | ICD-10-CM | POA: Diagnosis not present

## 2022-09-27 DIAGNOSIS — E039 Hypothyroidism, unspecified: Secondary | ICD-10-CM | POA: Diagnosis not present

## 2022-09-27 DIAGNOSIS — E785 Hyperlipidemia, unspecified: Secondary | ICD-10-CM | POA: Diagnosis not present

## 2022-09-27 DIAGNOSIS — I130 Hypertensive heart and chronic kidney disease with heart failure and stage 1 through stage 4 chronic kidney disease, or unspecified chronic kidney disease: Secondary | ICD-10-CM | POA: Diagnosis not present

## 2022-09-27 DIAGNOSIS — N184 Chronic kidney disease, stage 4 (severe): Secondary | ICD-10-CM | POA: Diagnosis not present

## 2022-09-29 ENCOUNTER — Ambulatory Visit: Payer: Self-pay

## 2022-09-29 NOTE — Patient Outreach (Signed)
  Care Coordination   09/29/2022 Name: Rick [name] MRN: [id number] DOB: 1929-02-06   Care Coordination Outreach Attempts:  An unsuccessful telephone outreach was attempted for a scheduled appointment today. Attempted call to listed mobile / work number.  Unable to reach patient or leave voice message due to voice mail not being set.  Attempted call to listed home number.  Unable to reach patient. HIPAA compliant voice message left with call back phone number.   Follow Up Plan:  Additional outreach attempts will be made to offer the patient care coordination information and services.   Encounter Outcome:  No Answer   Care Coordination Interventions:  No, not indicated    George Ina [facility] Care Coordination [phone] direct line

## 2022-09-30 ENCOUNTER — Telehealth: Payer: Self-pay | Admitting: Pharmacist

## 2022-09-30 ENCOUNTER — Ambulatory Visit: Payer: Medicare PPO | Admitting: Pharmacist

## 2022-09-30 NOTE — Telephone Encounter (Signed)
Spoke with patient's wife [name]. Advised to take 2 lasix today and resume 1 tablet tomorrow. She voiced understanding and repeated instructions back to me. She will also contact Dr Doristine Church office.

## 2022-09-30 NOTE — Telephone Encounter (Signed)
I agree with all of that.  Please let pt/wife know. Thanks.

## 2022-09-30 NOTE — Telephone Encounter (Signed)
Spoke with patient's wife [name] and she reports pt had 6 lb weight gain over 24 hrs (157 lbs yesterday, 163 lbs today). She reports he has typically been weighing 160-164 lbs over the last week. Notably he was down to 140 lbs when he was discharged from the hospital 4/1. He has been taking Lasix 40 mg 1 tablet daily. She reports he is not short of breath but does appear "swollen" in feet and hands.  Of note she reports he saw Dr Thedore Mins 4/12 and they mentioned possibly switching furosemide to torsemide, but there has been some issues with insurance so this has not started yet.  I recommend to take an extra furosemide 40 mg today for weight gain of 6 lbs, then return to 1 tablet daily and follow back up with Dr Doristine Church office re: torsemide.  Routing to PCP for input (she will await update before taking extra dose of furosemide today)

## 2022-09-30 NOTE — Progress Notes (Signed)
Care Management & Coordination Services Pharmacy Note  09/30/2022 Name:  RONDELL PARDON MRN:  [id number] DOB:  September 29, 1928  Summary: Initial televisit -HF: pt had 6 lb wt gain over 24 hrs (157 to 163 lbs), he denies SOB but is "swollen"; daily weight typically 160-164 lbs over the past week, taking furosemide 40 mg daily -Anemia: HGB 8.0 (09/11/22); iron 30 (09/01/22); pt receives Retacrit injections at nephrology and takes oral iron -C. Diff: pt completed 10-day course of vancomycin, wife reports 1 episode of diarrhea since then but otherwise normal BM -HypoTH: TSH 27 (08/2022) elevated in s/o acute hospitalization for C diff, hyponatremia; it may be falsely elevated as acute phase reactant; pt continues on levothyroxine 50 mcg (dose was increased to 75 mcg but pt did not pick up)  Recommendations/Changes made from today's visit: -Recommend to take an extra furosemide 40 mg today for weight gain of 6 lbs, then return to 1 tablet daily and follow back up with Dr Doristine Church office re: torsemide.  -Advised pt to contact Dr Doristine Church office re: anemia and torsemide update -Advised to monitor for diarrhea, if recurrence contact PCP immediately -Recommend to continue levothyroxine 50 mcg and repeat TSH mid-May  Follow up plan: -Health Concierge will call pharmacy to initiate refills Bidil, metoprolol -Pharmacist follow up televisit scheduled for 1 month    Subjective: LATIF NAZARENO is an 89 y.o. year old male who is a primary patient of Para March, Dwana Curd, MD.  The care coordination team was consulted for assistance with disease management and care coordination needs.    Engaged with patient by telephone for initial visit.  Recent office visits: 09/11/22 Dr Para March OV: hospital f/u- recheck TSH in 6 weeks. Na 121, Hgb 8.0. f/u Nephrology within a week.  09/01/22 Dr Milinda Antis OV: diarrhea  Recent consult visits: 08/13/22 Dr Thedore Mins (Nephrology): f/u- check labs. Continue lasix 40 mg BID. Noted not a candidate  for  07/16/22 Dr Randie Heinz (Vascular): new pt- consider HD access  06/27/22 Dr Tenny Craw (Cardiology): HF - not a candidate for intervention.  Hospital visits: 09/02/22 - 09/08/22 Admission [facility]): Hyponatremia, C diff. Na 121. Fluid restriction 1200 ml daily. Palliative consult. F/u PCP and nephrology.  08/13/22 - 08/16/22 Admission [facility]): Upper GI bleed. Underwent EGD, treated APC. Started Pantoprazole 40 mg BID. 2 units PRBC transfused. Given Aranesp. Continue iron.  07/23/22 - 08/04/22 Admission [facility]): volume overload in s/o CKD 4, CHF. Aggressive diuresis.  Objective:  Lab Results  Component Value Date   CREATININE 5.03 (HH) 09/11/2022   BUN 48 (H) 09/11/2022   GFR 9.47 (LL) 09/11/2022   EGFR 8.0 08/14/2022   GFRNONAA 11 (L) 09/08/2022   GFRAA 49 (L) 01/11/2015   NA 121 (LL) 09/11/2022   K 3.9 09/11/2022   CALCIUM 7.6 (L) 09/11/2022   CO2 17 (L) 09/11/2022   GLUCOSE 97 09/11/2022    Lab Results  Component Value Date/Time   GFR 9.47 (LL) 09/11/2022 03:51 PM   GFR 7.50 (LL) 09/01/2022 03:47 PM   MICROALBUR 8.4 (H) 04/17/2011 09:06 AM   MICROALBUR 5.4 (H) 03/09/2009 09:26 AM    Last diabetic Eye exam:  Lab Results  Component Value Date/Time   HMDIABEYEEXA No Retinopathy 05/25/2014 12:00 AM    Last diabetic Foot exam: No results found for: "HMDIABFOOTEX"   Lab Results  Component Value Date   CHOL 91 06/02/2022   HDL 40 (L) 06/02/2022   LDLCALC 37 06/02/2022   TRIG 68 06/02/2022   CHOLHDL 2.3 06/02/2022  Latest Ref Rng & Units 09/08/2022    1:01 AM 09/07/2022   12:45 AM 09/06/2022   12:40 AM  Hepatic Function  Albumin 3.5 - 5.0 g/dL 1.7  1.7  1.7     Lab Results  Component Value Date/Time   TSH 27.205 (H) 09/02/2022 05:53 PM   TSH 9.695 (H) 07/27/2022 01:02 AM   TSH 6.25 (H) 12/25/2021 10:04 AM   TSH 9.520 (H) 08/19/2021 04:00 PM   FREET4 0.64 07/29/2022 01:09 AM   FREET4 1.03 09/04/2021 11:13 AM       Latest Ref Rng & Units 09/11/2022    3:51 PM 09/08/2022    1:01 AM  09/06/2022   12:40 AM  CBC  WBC 4.0 - 10.5 K/uL 5.2  4.9  8.0   Hemoglobin 13.0 - 17.0 g/dL 8.0 Repeated and verified X2.  9.4  8.2   Hematocrit 39.0 - 52.0 % 23.3 Repeated and verified X2.  28.7  25.0   Platelets 150.0 - 400.0 K/uL 163.0  171  186    Iron/TIBC/Ferritin/ %Sat    Component Value Date/Time   IRON 30 (L) 09/01/2022 1547   IRON 79 01/06/2022 0000   TIBC 134 (L) 07/25/2022 1030   FERRITIN 76 07/25/2022 1030   FERRITIN 124 01/06/2022 0000   IRONPCTSAT 38 07/25/2022 1030     Lab Results  Component Value Date/Time   VD25OH 29.06 (L) 12/31/2021 10:18 AM   VITAMINB12 771 05/02/2021 02:34 AM    Clinical ASCVD: Yes  The ASCVD Risk score (Arnett DK, et al., 2019) failed to calculate for the following reasons:   The 2019 ASCVD risk score is only valid for ages 43 to 55        09/01/2022    3:01 PM 11/27/2021    9:53 AM 05/28/2020   10:40 AM  Depression screen PHQ 2/9  Decreased Interest 0 0 0  Down, Depressed, Hopeless 0 0 0  PHQ - 2 Score 0 0 0  Altered sleeping 0    Tired, decreased energy 0    Change in appetite 0    Feeling bad or failure about yourself  0    Trouble concentrating 0    Moving slowly or fidgety/restless 0    Suicidal thoughts 0    PHQ-9 Score 0    Difficult doing work/chores Not difficult at all       Social History   Tobacco Use  Smoking Status Former   Types: Cigarettes   Quit date: 08/26/2001   Years since quitting: 21.1  Smokeless Tobacco Former   Types: Chew  Tobacco Comments   quit about 10 years or more   BP Readings from Last 3 Encounters:  09/11/22 120/64  09/08/22 133/84  09/01/22 120/60   Pulse Readings from Last 3 Encounters:  09/11/22 72  09/08/22 70  09/01/22 68   Wt Readings from Last 3 Encounters:  09/08/22 140 lb 3.4 oz (63.6 kg)  09/01/22 145 lb (65.8 kg)  08/21/22 137 lb (62.1 kg)   BMI Readings from Last 3 Encounters:  09/08/22 20.71 kg/m  09/01/22 22.05 kg/m  08/21/22 20.83 kg/m    Allergies   Allergen Reactions   Lisinopril     cough   Nsaids     Elevated Cr    Medications Reviewed Today     Reviewed by Wilburn Cornelia, CMA (Certified Medical Assistant) on 09/11/22 at 1510  Med List Status: <None>   Medication Order Taking? Sig Documenting Provider Last Dose Status  Informant  acetaminophen (TYLENOL) 325 MG tablet [id number] Yes Take 2 tablets (650 mg total) by mouth every 6 (six) hours as needed.  Patient taking differently: Take 650 mg by mouth every 6 (six) hours as needed for moderate pain.   Adam Phenix, PA-C Taking Active Spouse/Significant Other, Pharmacy Records  atorvastatin (LIPITOR) 10 MG tablet [id number] Yes Take 1 tablet (10 mg total) by mouth daily. Joaquim Nam, MD Taking Active Spouse/Significant Other, Pharmacy Records  ferrous sulfate 325 (65 FE) MG tablet [id number] Yes Take 1 tablet (325 mg total) by mouth daily with breakfast. Joaquim Nam, MD Taking Active Spouse/Significant Other, Pharmacy Records  furosemide (LASIX) 40 MG tablet [id number] Yes Take 1 tablet (40 mg total) by mouth daily. Narda Bonds, MD Taking Active Spouse/Significant Other, Pharmacy Records  isosorbide-hydrALAZINE (BIDIL) 20-37.5 MG tablet [id number] Yes Take 1 tablet by mouth 2 (two) times daily. Almon Hercules, MD Taking Active   latanoprost (XALATAN) 0.005 % ophthalmic solution [id number] Yes Place 1 drop into both eyes at bedtime. [provider] Taking Active Spouse/Significant Other, Pharmacy Records  levothyroxine (SYNTHROID) 75 MCG tablet [id number] Yes Take 1 tablet (75 mcg total) by mouth daily. Take on empty stomach early in the morning. Almon Hercules, MD Taking Active   metoprolol succinate (TOPROL-XL) 25 MG 24 hr tablet [id number]  Take 0.5 tablets (12.5 mg total) by mouth daily. Arrien, York Ram, MD  Expired 09/05/22 2359 Spouse/Significant Other, Pharmacy Records  pantoprazole (PROTONIX) 40 MG tablet [id number] Yes Take 1 tablet (40 mg total) by  mouth 2 (two) times daily before a meal. Dahal, Melina Schools, MD Taking Active Spouse/Significant Other, Pharmacy Records  sodium chloride 1 g tablet [id number] Yes Take 1 tablet (1 g total) by mouth 3 (three) times daily with meals for 10 days. Almon Hercules, MD Taking Active   tamsulosin (FLOMAX) 0.4 MG CAPS capsule [id number] Yes Take 0.4 mg by mouth daily. [provider] Taking Active Spouse/Significant Other, Pharmacy Records  timolol (TIMOPTIC) 0.5 % ophthalmic solution [id number] Yes Place 1 drop into both eyes every morning. [provider] Taking Active Spouse/Significant Other, Pharmacy Records  vancomycin (VANCOCIN) 125 MG capsule [id number] Yes Take 1 capsule (125 mg total) by mouth 4 (four) times daily for 6 days. Almon Hercules, MD Taking Active             SDOH:  (Social Determinants of Health) assessments and interventions performed: No SDOH Interventions    Flowsheet Row Telephone from 09/10/2022 in [facility] Coordination ED to Hosp-Admission (Discharged) from 09/02/2022 in [facility] 3E HF PCU Telephone from 08/18/2022 in [facility] Coordination Telephone from 08/07/2022 in [facility] Coord[facility] Telephone from 06/11/2022 in [facility] Coord[facility] Clinical Support from 11/27/2021 in [facility] [place]  SDOH Interventions        Food Insecurity Interventions Intervention Not Indicated -- Intervention Not Indicated Intervention Not Indicated Intervention Not Indicated Intervention Not Indicated  Housing Interventions Intervention Not Indicated Intervention Not Indicated Intervention Not Indicated Intervention Not Indicated Intervention Not Indicated Intervention Not Indicated  Transportation Interventions Intervention Not Indicated -- Intervention Not Indicated Intervention Not Indicated Intervention Not Indicated Intervention  Not Indicated  Financial Strain Interventions -- -- -- -- -- Intervention Not Indicated  Physical Activity Interventions -- -- -- -- -- Intervention Not Indicated  Stress Interventions -- -- -- -- -- Intervention Not Indicated  Social Connections Interventions -- -- -- -- --  Intervention Not Indicated       Medication Assistance: None required.  Patient affirms current coverage meets needs.  Medication Access: Within the past 30 days, how often has patient missed a dose of medication? 0 Is a pillbox or other method used to improve adherence? Yes  Factors that may affect medication adherence? no barriers identified Are meds synced by current pharmacy? No  Are meds delivered by current pharmacy? No  Does patient experience delays in picking up medications due to transportation concerns? No   [facility] Services Reviewed: Is patient disadvantaged to use [facility]?: No  Current Rx insurance plan: [employer] Name and location of Current pharmacy:  [id number] [place], [place] - [address] [place] [address] Phone: [phone] Fax: [phone]  [facility] [address] [place] [address] Phone: [phone] Fax: [phone]  [facility] Pharm[facility] services reviewed with patient today?: No  Patient requests to transfer care to [facility] Pharm[facility]?: No  Reason patient declined to change pharmacies: Not mentioned at this visit  Compliance/Adherence/Medication fill history: Care Gaps: None  Star-Rating Drugs: Atorvastatin - PDC   Assessment/Plan   Heart Failure (Goal: manage symptoms and prevent exacerbations) -Uncontrolled - pt had 6 lb wt gain over 24 hrs (157 to 163 lbs), he denies SOB but is "swollen"; daily weight typically 160-164 lbs over the past week, taking furosemide 40 mg daily -Of note she reports he saw Dr Thedore Mins 4/12 and they mentioned possibly switching furosemide  to torsemide, but there has been some issues with insurance so this has not started yet.  -Current home BP/HR readings: 120/84, 70 today; 106/64; 124/70 -Current home daily weights: 163#, 157#, 164# -Last ejection fraction: 30-35% (Date: 06/01/22) -HF type: HFrEF (EF < 40%) -NYHA Class: III (marked limitation of activity) -AHA HF Stage: C (Heart disease and symptoms present) -Current treatment: Furosemide 40 mg daily - Appropriate, Effective, Safe, Accessible Isosorbide-hydralazine 20-37.5 mg BID - Appropriate, Effective, Safe, Accessible Metoprolol succinate 25 mg - 1/2 tab daily - Appropriate, Effective, Safe, Accessible -Medications previously tried: n/a  -Educated on Benefits of medications for managing symptoms and prolonging life Importance of weighing daily; if you gain more than 3 pounds in one day or 5 pounds in one week, contact provider -Recommend to take an extra furosemide 40 mg today for weight gain of 6 lbs, then return to 1 tablet daily and follow back up with Dr Doristine Church office re: torsemide.   Hyperlipidemia: (LDL goal < 70) -Controlled - LDL 37 (05/2022) at goal -Current treatment: Atorvastatin 10 mg daily HS - Appropriate, Effective, Safe, Accessible -Medications previously tried: n/a  -Educated on Cholesterol goals;  -Recommended to continue current medication  Anemia (Goal: HGB > 11) -Not ideally controlled - HGB 8.0 (09/2022); iron level 30 09/01/22 -Current treatment  Ferrous sulfate 325 mg daily - Appropriate, Query Effective Retacrit injections -Appropriate, Query Effective -Medications previously tried: n/a -Discussed oral iron is not well absorbed in CKD, he may need infusions; advised to follow up with nephrology re: iron and Retacrit injection   Hypothyroidism (Goal TSH: 0.4-4.5) -Query controlled- TSH 27 (08/2022) elevated in s/o acute hospitalization for C diff, hyponatremia -Current treatment: Levothyroxine 75 mcg daily -Appropriate, Query  Effective -Counseled to take medication n/a -Recommend to re-check TSH  Health Maintenance -GERD: on pantoprazole 40 mg BID -BPH: on flomax; follows with urology (Dr Marlou Porch) -s/p C diff infection. Completed 10-day vancomycin course.  Charlene Brooke, PharmD, Para March, CPP Clinical Pharmacist Practitioner [place] at [facility] [phone]

## 2022-10-01 ENCOUNTER — Telehealth: Payer: Self-pay | Admitting: Family Medicine

## 2022-10-01 DIAGNOSIS — I130 Hypertensive heart and chronic kidney disease with heart failure and stage 1 through stage 4 chronic kidney disease, or unspecified chronic kidney disease: Secondary | ICD-10-CM | POA: Diagnosis not present

## 2022-10-01 DIAGNOSIS — D631 Anemia in chronic kidney disease: Secondary | ICD-10-CM | POA: Diagnosis not present

## 2022-10-01 DIAGNOSIS — D62 Acute posthemorrhagic anemia: Secondary | ICD-10-CM | POA: Diagnosis not present

## 2022-10-01 DIAGNOSIS — E039 Hypothyroidism, unspecified: Secondary | ICD-10-CM | POA: Diagnosis not present

## 2022-10-01 DIAGNOSIS — N184 Chronic kidney disease, stage 4 (severe): Secondary | ICD-10-CM | POA: Diagnosis not present

## 2022-10-01 DIAGNOSIS — K317 Polyp of stomach and duodenum: Secondary | ICD-10-CM | POA: Diagnosis not present

## 2022-10-01 DIAGNOSIS — I5043 Acute on chronic combined systolic (congestive) and diastolic (congestive) heart failure: Secondary | ICD-10-CM | POA: Diagnosis not present

## 2022-10-01 DIAGNOSIS — K31819 Angiodysplasia of stomach and duodenum without bleeding: Secondary | ICD-10-CM | POA: Diagnosis not present

## 2022-10-01 DIAGNOSIS — E785 Hyperlipidemia, unspecified: Secondary | ICD-10-CM | POA: Diagnosis not present

## 2022-10-01 NOTE — Telephone Encounter (Signed)
Called [facility] and spoke with clinical supervisor Leonides Sake and relayed to her Dr. Lianne Bushy message along with message from OT. Leonides Sake stated that she will get with Dr. Thedore Mins and let him know what is going on with the patient. Leonides Sake stated that they will reach out to the patient.

## 2022-10-01 NOTE — Telephone Encounter (Signed)
Rick [name] with [facility] notified as instructed by telephone and verbalized understanding. Jon Gills stated that the patient is having SOB when he gets up moving around other than that his oxygen level is around 97%. Jon Gills stated that she is going to call the patient's wife and daughter that is there now and let them know we need to get the renal clinic involved.

## 2022-10-01 NOTE — Telephone Encounter (Signed)
I need this to get directed to the renal clinic today for input today.  If his current medications are not helping then I need renal help.  If he is more short of breath then I need him to go to the emergency room.

## 2022-10-01 NOTE — Telephone Encounter (Signed)
Earvin Hansen, OT from [facility] contacted the office regarding this patient. 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 He is weighing at 164.8 today, weighed 155. Wants to know how Dr. Para March would like to proceed, states patient has an appt on  4/30 but would like to know if Dr. Para March wants to see him sooner. States they would like to keep patient out of the hospital if possible. Please advise Jon Gills if needed, [phone].

## 2022-10-01 NOTE — Telephone Encounter (Signed)
Agree. Thanks

## 2022-10-02 ENCOUNTER — Encounter (HOSPITAL_COMMUNITY)
Admission: RE | Admit: 2022-10-02 | Discharge: 2022-10-02 | Disposition: A | Discharge status: Home / Self Care | Payer: Medicare PPO | Source: Ambulatory Visit | Attending: Nephrology | Admitting: Nephrology

## 2022-10-02 VITALS — BP 115/77 | HR 64 | Temp 97.2°F | Resp 18

## 2022-10-02 DIAGNOSIS — K317 Polyp of stomach and duodenum: Secondary | ICD-10-CM | POA: Diagnosis not present

## 2022-10-02 DIAGNOSIS — I5043 Acute on chronic combined systolic (congestive) and diastolic (congestive) heart failure: Secondary | ICD-10-CM | POA: Diagnosis not present

## 2022-10-02 DIAGNOSIS — D62 Acute posthemorrhagic anemia: Secondary | ICD-10-CM | POA: Diagnosis not present

## 2022-10-02 DIAGNOSIS — N184 Chronic kidney disease, stage 4 (severe): Secondary | ICD-10-CM | POA: Diagnosis not present

## 2022-10-02 DIAGNOSIS — E039 Hypothyroidism, unspecified: Secondary | ICD-10-CM | POA: Diagnosis not present

## 2022-10-02 DIAGNOSIS — D631 Anemia in chronic kidney disease: Secondary | ICD-10-CM | POA: Diagnosis not present

## 2022-10-02 DIAGNOSIS — E785 Hyperlipidemia, unspecified: Secondary | ICD-10-CM | POA: Diagnosis not present

## 2022-10-02 DIAGNOSIS — K31819 Angiodysplasia of stomach and duodenum without bleeding: Secondary | ICD-10-CM | POA: Diagnosis not present

## 2022-10-02 DIAGNOSIS — I130 Hypertensive heart and chronic kidney disease with heart failure and stage 1 through stage 4 chronic kidney disease, or unspecified chronic kidney disease: Secondary | ICD-10-CM | POA: Diagnosis not present

## 2022-10-02 LAB — IRON AND TIBC
Iron: 48 ug/dL (ref 45–182)
Saturation Ratios: 38 % (ref 17.9–39.5)
TIBC: 126 ug/dL — ABNORMAL LOW (ref 250–450)
UIBC: 78 ug/dL

## 2022-10-02 LAB — FERRITIN: Ferritin: 226 ng/mL (ref 24–336)

## 2022-10-02 LAB — POCT HEMOGLOBIN-HEMACUE: Hemoglobin: 7.6 g/dL — ABNORMAL LOW (ref 13.0–17.0)

## 2022-10-02 MED ORDER — EPOETIN ALFA-EPBX 10000 UNIT/ML IJ SOLN
INTRAMUSCULAR | Status: AC
  Administered 2022-10-02: 10000 [IU] via SUBCUTANEOUS
  Filled 2022-10-02: qty 1

## 2022-10-02 MED ORDER — EPOETIN ALFA-EPBX 10000 UNIT/ML IJ SOLN: 10000.0000 [IU] | INTRAMUSCULAR | Status: DC

## 2022-10-03 NOTE — Telephone Encounter (Signed)
Rick [name] with [name] called to ask Rick [name] if they could ask nursing to the pt's HH orders to help the pt's family with education & medication. Call back # [phone].

## 2022-10-03 NOTE — Telephone Encounter (Signed)
Called and advised Rick [name] with [facility]  of the approval of the requested verbal orders for this patient. Advised to call back with any further questions.

## 2022-10-03 NOTE — Telephone Encounter (Signed)
Please give the order.  Thanks.   

## 2022-10-03 NOTE — Patient Instructions (Signed)
Visit Information  Phone number for Pharmacist: [phone]  Thank you for meeting with me to discuss your medications! Below is a summary of what we talked about during the visit:   Recommendations/Changes made from today's visit: -Recommend to take an extra furosemide 40 mg today for weight gain of 6 lbs, then return to 1 tablet daily and follow back up with Dr Doristine Church office re: torsemide.  -Advised pt to contact Dr Doristine Church office re: anemia and torsemide update -Advised to monitor for diarrhea, if recurrence contact PCP immediately -Recommend to continue levothyroxine 50 mcg and repeat TSH mid-may  Follow up plan: -Health Concierge will call pharmacy to initiate refills Bidil, metoprolol -Pharmacist follow up televisit scheduled for 1 month   Al Corpus, PharmD, BCACP Clinical Pharmacist [facility] [phone]

## 2022-10-06 ENCOUNTER — Telehealth: Payer: Self-pay | Admitting: *Deleted

## 2022-10-06 DIAGNOSIS — I5043 Acute on chronic combined systolic (congestive) and diastolic (congestive) heart failure: Secondary | ICD-10-CM | POA: Diagnosis not present

## 2022-10-06 DIAGNOSIS — D631 Anemia in chronic kidney disease: Secondary | ICD-10-CM | POA: Diagnosis not present

## 2022-10-06 DIAGNOSIS — E871 Hypo-osmolality and hyponatremia: Secondary | ICD-10-CM | POA: Diagnosis not present

## 2022-10-06 DIAGNOSIS — N185 Chronic kidney disease, stage 5: Secondary | ICD-10-CM | POA: Diagnosis not present

## 2022-10-06 DIAGNOSIS — K31819 Angiodysplasia of stomach and duodenum without bleeding: Secondary | ICD-10-CM | POA: Diagnosis not present

## 2022-10-06 DIAGNOSIS — D62 Acute posthemorrhagic anemia: Secondary | ICD-10-CM | POA: Diagnosis not present

## 2022-10-06 DIAGNOSIS — A0472 Enterocolitis due to Clostridium difficile, not specified as recurrent: Secondary | ICD-10-CM | POA: Diagnosis not present

## 2022-10-06 DIAGNOSIS — K317 Polyp of stomach and duodenum: Secondary | ICD-10-CM | POA: Diagnosis not present

## 2022-10-06 DIAGNOSIS — I132 Hypertensive heart and chronic kidney disease with heart failure and with stage 5 chronic kidney disease, or end stage renal disease: Secondary | ICD-10-CM | POA: Diagnosis not present

## 2022-10-06 NOTE — Progress Notes (Signed)
Care Management & Coordination Services Pharmacy Team  Reason for Encounter: Update pharmacy medications after hospital discharge   Spoke with caregiver on 10/06/2022 Spoke with Wife [name]  Patient did get  both the isosorbide and metoprolol and [facility]. Patient is feeling well, also using the torsemide and weight today was 163 lbs this am  and 166 lbs  late afternoon.    Al Corpus, PharmD notified  Burt Knack, [facility] Clinical Pharmacy Assistant [phone]

## 2022-10-06 NOTE — Progress Notes (Signed)
  Care Coordination Note  10/06/2022 Name: KAZMIR OKI MRN: [id number] DOB: 1928-06-21  JARIAN LONGORIA is a 89 y.o. year old male who is a primary care patient of Joaquim Nam, MD and is actively engaged with the care management team. I reached out to Henry Russel by phone today to assist with re-scheduling an initial visit with the RN Case Manager  Follow up plan: We have been unable to make contact with the patient for follow up.   Burman Nieves, CCMA Care Coordination Care Guide Direct Dial: [phone]

## 2022-10-06 NOTE — Telephone Encounter (Signed)
-----   Message from Garry Heater Nenzel, [facility] sent at 10/02/2022  4:54 PM EDT ----- Regarding: [facility] and [facility]rolol and Bidil (isosorbide/hydralazine), pt said he is running out of his supply from the hospital, I think he should have Rx on file from previous

## 2022-10-07 DIAGNOSIS — I132 Hypertensive heart and chronic kidney disease with heart failure and with stage 5 chronic kidney disease, or end stage renal disease: Secondary | ICD-10-CM | POA: Diagnosis not present

## 2022-10-07 DIAGNOSIS — I5043 Acute on chronic combined systolic (congestive) and diastolic (congestive) heart failure: Secondary | ICD-10-CM | POA: Diagnosis not present

## 2022-10-07 DIAGNOSIS — N185 Chronic kidney disease, stage 5: Secondary | ICD-10-CM | POA: Diagnosis not present

## 2022-10-07 DIAGNOSIS — R609 Edema, unspecified: Secondary | ICD-10-CM | POA: Diagnosis not present

## 2022-10-07 DIAGNOSIS — E871 Hypo-osmolality and hyponatremia: Secondary | ICD-10-CM | POA: Diagnosis not present

## 2022-10-07 DIAGNOSIS — D62 Acute posthemorrhagic anemia: Secondary | ICD-10-CM | POA: Diagnosis not present

## 2022-10-07 DIAGNOSIS — A0472 Enterocolitis due to Clostridium difficile, not specified as recurrent: Secondary | ICD-10-CM | POA: Diagnosis not present

## 2022-10-07 DIAGNOSIS — K317 Polyp of stomach and duodenum: Secondary | ICD-10-CM | POA: Diagnosis not present

## 2022-10-07 DIAGNOSIS — N2581 Secondary hyperparathyroidism of renal origin: Secondary | ICD-10-CM | POA: Diagnosis not present

## 2022-10-07 DIAGNOSIS — I130 Hypertensive heart and chronic kidney disease with heart failure and stage 1 through stage 4 chronic kidney disease, or unspecified chronic kidney disease: Secondary | ICD-10-CM | POA: Diagnosis not present

## 2022-10-07 DIAGNOSIS — I5020 Unspecified systolic (congestive) heart failure: Secondary | ICD-10-CM | POA: Diagnosis not present

## 2022-10-07 DIAGNOSIS — K31819 Angiodysplasia of stomach and duodenum without bleeding: Secondary | ICD-10-CM | POA: Diagnosis not present

## 2022-10-07 DIAGNOSIS — Q613 Polycystic kidney, unspecified: Secondary | ICD-10-CM | POA: Diagnosis not present

## 2022-10-07 DIAGNOSIS — D631 Anemia in chronic kidney disease: Secondary | ICD-10-CM | POA: Diagnosis not present

## 2022-10-07 LAB — RENAL FUNCTION PANEL: EGFR: 9

## 2022-10-09 ENCOUNTER — Inpatient Hospital Stay (HOSPITAL_COMMUNITY)
Admission: EM | Admit: 2022-10-09 | Discharge: 2022-10-14 | DRG: 951 | Disposition: A | Discharge status: Hospice | Payer: Medicare PPO | Attending: Internal Medicine | Admitting: Internal Medicine

## 2022-10-09 ENCOUNTER — Encounter (HOSPITAL_COMMUNITY): Payer: Self-pay

## 2022-10-09 ENCOUNTER — Other Ambulatory Visit: Payer: Self-pay

## 2022-10-09 ENCOUNTER — Emergency Department (HOSPITAL_COMMUNITY): Payer: Medicare PPO

## 2022-10-09 DIAGNOSIS — I5042 Chronic combined systolic (congestive) and diastolic (congestive) heart failure: Secondary | ICD-10-CM | POA: Diagnosis present

## 2022-10-09 DIAGNOSIS — I5020 Unspecified systolic (congestive) heart failure: Secondary | ICD-10-CM | POA: Diagnosis not present

## 2022-10-09 DIAGNOSIS — R0600 Dyspnea, unspecified: Secondary | ICD-10-CM | POA: Diagnosis not present

## 2022-10-09 DIAGNOSIS — E876 Hypokalemia: Secondary | ICD-10-CM | POA: Diagnosis present

## 2022-10-09 DIAGNOSIS — A4181 Sepsis due to Enterococcus: Secondary | ICD-10-CM | POA: Diagnosis present

## 2022-10-09 DIAGNOSIS — Z66 Do not resuscitate: Secondary | ICD-10-CM | POA: Diagnosis present

## 2022-10-09 DIAGNOSIS — E8809 Other disorders of plasma-protein metabolism, not elsewhere classified: Secondary | ICD-10-CM | POA: Diagnosis present

## 2022-10-09 DIAGNOSIS — E861 Hypovolemia: Secondary | ICD-10-CM | POA: Diagnosis present

## 2022-10-09 DIAGNOSIS — I447 Left bundle-branch block, unspecified: Secondary | ICD-10-CM | POA: Diagnosis present

## 2022-10-09 DIAGNOSIS — Z8619 Personal history of other infectious and parasitic diseases: Secondary | ICD-10-CM

## 2022-10-09 DIAGNOSIS — D696 Thrombocytopenia, unspecified: Secondary | ICD-10-CM | POA: Diagnosis present

## 2022-10-09 DIAGNOSIS — L89159 Pressure ulcer of sacral region, unspecified stage: Secondary | ICD-10-CM | POA: Diagnosis present

## 2022-10-09 DIAGNOSIS — E871 Hypo-osmolality and hyponatremia: Secondary | ICD-10-CM | POA: Diagnosis present

## 2022-10-09 DIAGNOSIS — N185 Chronic kidney disease, stage 5: Secondary | ICD-10-CM | POA: Diagnosis present

## 2022-10-09 DIAGNOSIS — Z801 Family history of malignant neoplasm of trachea, bronchus and lung: Secondary | ICD-10-CM

## 2022-10-09 DIAGNOSIS — E785 Hyperlipidemia, unspecified: Secondary | ICD-10-CM | POA: Diagnosis present

## 2022-10-09 DIAGNOSIS — D509 Iron deficiency anemia, unspecified: Secondary | ICD-10-CM | POA: Diagnosis present

## 2022-10-09 DIAGNOSIS — R627 Adult failure to thrive: Secondary | ICD-10-CM | POA: Diagnosis present

## 2022-10-09 DIAGNOSIS — Z823 Family history of stroke: Secondary | ICD-10-CM

## 2022-10-09 DIAGNOSIS — E43 Unspecified severe protein-calorie malnutrition: Secondary | ICD-10-CM | POA: Diagnosis present

## 2022-10-09 DIAGNOSIS — D631 Anemia in chronic kidney disease: Secondary | ICD-10-CM | POA: Diagnosis present

## 2022-10-09 DIAGNOSIS — I132 Hypertensive heart and chronic kidney disease with heart failure and with stage 5 chronic kidney disease, or end stage renal disease: Secondary | ICD-10-CM | POA: Diagnosis present

## 2022-10-09 DIAGNOSIS — R531 Weakness: Secondary | ICD-10-CM | POA: Diagnosis not present

## 2022-10-09 DIAGNOSIS — R54 Age-related physical debility: Secondary | ICD-10-CM | POA: Diagnosis present

## 2022-10-09 DIAGNOSIS — I5043 Acute on chronic combined systolic (congestive) and diastolic (congestive) heart failure: Secondary | ICD-10-CM | POA: Diagnosis not present

## 2022-10-09 DIAGNOSIS — E039 Hypothyroidism, unspecified: Secondary | ICD-10-CM | POA: Diagnosis present

## 2022-10-09 DIAGNOSIS — N400 Enlarged prostate without lower urinary tract symptoms: Secondary | ICD-10-CM | POA: Diagnosis present

## 2022-10-09 DIAGNOSIS — N179 Acute kidney failure, unspecified: Secondary | ICD-10-CM | POA: Diagnosis present

## 2022-10-09 DIAGNOSIS — Z800 Family history of malignant neoplasm of digestive organs: Secondary | ICD-10-CM

## 2022-10-09 DIAGNOSIS — E161 Other hypoglycemia: Secondary | ICD-10-CM | POA: Diagnosis not present

## 2022-10-09 DIAGNOSIS — Z1152 Encounter for screening for COVID-19: Secondary | ICD-10-CM

## 2022-10-09 DIAGNOSIS — Z7401 Bed confinement status: Secondary | ICD-10-CM | POA: Diagnosis not present

## 2022-10-09 DIAGNOSIS — Z7189 Other specified counseling: Secondary | ICD-10-CM | POA: Diagnosis not present

## 2022-10-09 DIAGNOSIS — Q613 Polycystic kidney, unspecified: Secondary | ICD-10-CM | POA: Diagnosis not present

## 2022-10-09 DIAGNOSIS — Z515 Encounter for palliative care: Secondary | ICD-10-CM | POA: Diagnosis not present

## 2022-10-09 DIAGNOSIS — R6521 Severe sepsis with septic shock: Secondary | ICD-10-CM | POA: Diagnosis present

## 2022-10-09 DIAGNOSIS — K219 Gastro-esophageal reflux disease without esophagitis: Secondary | ICD-10-CM | POA: Diagnosis not present

## 2022-10-09 DIAGNOSIS — E872 Acidosis, unspecified: Secondary | ICD-10-CM | POA: Diagnosis present

## 2022-10-09 DIAGNOSIS — E162 Hypoglycemia, unspecified: Secondary | ICD-10-CM | POA: Diagnosis present

## 2022-10-09 DIAGNOSIS — K317 Polyp of stomach and duodenum: Secondary | ICD-10-CM | POA: Diagnosis not present

## 2022-10-09 DIAGNOSIS — A419 Sepsis, unspecified organism: Principal | ICD-10-CM

## 2022-10-09 DIAGNOSIS — R579 Shock, unspecified: Secondary | ICD-10-CM | POA: Diagnosis present

## 2022-10-09 DIAGNOSIS — G9341 Metabolic encephalopathy: Secondary | ICD-10-CM | POA: Diagnosis present

## 2022-10-09 DIAGNOSIS — Z79890 Hormone replacement therapy (postmenopausal): Secondary | ICD-10-CM

## 2022-10-09 DIAGNOSIS — I959 Hypotension, unspecified: Secondary | ICD-10-CM | POA: Diagnosis not present

## 2022-10-09 DIAGNOSIS — R609 Edema, unspecified: Secondary | ICD-10-CM | POA: Diagnosis not present

## 2022-10-09 DIAGNOSIS — J90 Pleural effusion, not elsewhere classified: Secondary | ICD-10-CM | POA: Diagnosis not present

## 2022-10-09 DIAGNOSIS — Z79899 Other long term (current) drug therapy: Secondary | ICD-10-CM

## 2022-10-09 DIAGNOSIS — Z8546 Personal history of malignant neoplasm of prostate: Secondary | ICD-10-CM

## 2022-10-09 DIAGNOSIS — A0472 Enterocolitis due to Clostridium difficile, not specified as recurrent: Secondary | ICD-10-CM | POA: Diagnosis not present

## 2022-10-09 DIAGNOSIS — D62 Acute posthemorrhagic anemia: Secondary | ICD-10-CM | POA: Diagnosis not present

## 2022-10-09 DIAGNOSIS — Z87891 Personal history of nicotine dependence: Secondary | ICD-10-CM

## 2022-10-09 DIAGNOSIS — R9431 Abnormal electrocardiogram [ECG] [EKG]: Secondary | ICD-10-CM | POA: Diagnosis not present

## 2022-10-09 DIAGNOSIS — K31819 Angiodysplasia of stomach and duodenum without bleeding: Secondary | ICD-10-CM | POA: Diagnosis not present

## 2022-10-09 LAB — COMPREHENSIVE METABOLIC PANEL
ALT: 9 U/L (ref 0–44)
AST: 19 U/L (ref 15–41)
Albumin: 1.6 g/dL — ABNORMAL LOW (ref 3.5–5.0)
Alkaline Phosphatase: 139 U/L — ABNORMAL HIGH (ref 38–126)
Anion gap: 14 (ref 5–15)
BUN: 69 mg/dL — ABNORMAL HIGH (ref 8–23)
CO2: 16 mmol/L — ABNORMAL LOW (ref 22–32)
Calcium: 7.3 mg/dL — ABNORMAL LOW (ref 8.9–10.3)
Chloride: 91 mmol/L — ABNORMAL LOW (ref 98–111)
Creatinine, Ser: 6.28 mg/dL — ABNORMAL HIGH (ref 0.61–1.24)
GFR, Estimated: 8 mL/min — ABNORMAL LOW (ref >60)
Glucose, Bld: 69 mg/dL — ABNORMAL LOW (ref 70–99)
Potassium: 2.8 mmol/L — ABNORMAL LOW (ref 3.5–5.1)
Sodium: 121 mmol/L — ABNORMAL LOW (ref 135–145)
Total Bilirubin: 1 mg/dL (ref 0.3–1.2)
Total Protein: 6 g/dL — ABNORMAL LOW (ref 6.5–8.1)

## 2022-10-09 LAB — CBC WITH DIFFERENTIAL/PLATELET
Abs Immature Granulocytes: 0.05 10*3/uL (ref 0.00–0.07)
Basophils Absolute: 0 10*3/uL (ref 0.0–0.1)
Basophils Relative: 0 %
Eosinophils Absolute: 0 10*3/uL (ref 0.0–0.5)
Eosinophils Relative: 0 %
HCT: 20.5 % — ABNORMAL LOW (ref 39.0–52.0)
Hemoglobin: 7 g/dL — ABNORMAL LOW (ref 13.0–17.0)
Immature Granulocytes: 1 %
Lymphocytes Relative: 3 %
Lymphs Abs: 0.4 10*3/uL — ABNORMAL LOW (ref 0.7–4.0)
MCH: 28.6 pg (ref 26.0–34.0)
MCHC: 34.1 g/dL (ref 30.0–36.0)
MCV: 83.7 fL (ref 80.0–100.0)
Monocytes Absolute: 0.1 10*3/uL (ref 0.1–1.0)
Monocytes Relative: 1 %
Neutro Abs: 10.4 10*3/uL — ABNORMAL HIGH (ref 1.7–7.7)
Neutrophils Relative %: 95 %
Platelets: 120 10*3/uL — ABNORMAL LOW (ref 150–400)
RBC: 2.45 MIL/uL — ABNORMAL LOW (ref 4.22–5.81)
RDW: 17.5 % — ABNORMAL HIGH (ref 11.5–15.5)
WBC: 11 10*3/uL — ABNORMAL HIGH (ref 4.0–10.5)
nRBC: 0.2 % (ref 0.0–0.2)

## 2022-10-09 LAB — PROTIME-INR
INR: 1.5 — ABNORMAL HIGH (ref 0.8–1.2)
Prothrombin Time: 17.7 seconds — ABNORMAL HIGH (ref 11.4–15.2)

## 2022-10-09 LAB — LACTIC ACID, PLASMA: Lactic Acid, Venous: 1.2 mmol/L (ref 0.5–1.9)

## 2022-10-09 LAB — CBG MONITORING, ED
Glucose-Capillary: 52 mg/dL — ABNORMAL LOW (ref 70–99)
Glucose-Capillary: 76 mg/dL (ref 70–99)
Glucose-Capillary: 137 mg/dL — ABNORMAL HIGH (ref 70–99)

## 2022-10-09 MED ORDER — METRONIDAZOLE 500 MG/100ML IV SOLN
500.0000 mg | Freq: Once | INTRAVENOUS | Status: AC
  Administered 2022-10-09: 500 mg via INTRAVENOUS
  Filled 2022-10-09: qty 100

## 2022-10-09 MED ORDER — VANCOMYCIN VARIABLE DOSE PER UNSTABLE RENAL FUNCTION (PHARMACIST DOSING): Status: DC

## 2022-10-09 MED ORDER — DEXTROSE 50 % IV SOLN: 25.0000 g | Freq: Once | INTRAVENOUS | Status: AC

## 2022-10-09 MED ORDER — DEXTROSE 50 % IV SOLN
INTRAVENOUS | Status: AC
  Administered 2022-10-09: 25 g via INTRAVENOUS
  Filled 2022-10-09: qty 50

## 2022-10-09 MED ORDER — VANCOMYCIN HCL IN DEXTROSE 1-5 GM/200ML-% IV SOLN: 1000.0000 mg | Freq: Once | INTRAVENOUS | Status: DC

## 2022-10-09 MED ORDER — SODIUM CHLORIDE 0.9 % IV SOLN
1.0000 g | INTRAVENOUS | Status: DC
  Filled 2022-10-09: qty 10

## 2022-10-09 MED ORDER — LACTATED RINGERS IV SOLN: INTRAVENOUS | Status: DC

## 2022-10-09 MED ORDER — VANCOMYCIN HCL 1250 MG/250ML IV SOLN
1250.0000 mg | Freq: Once | INTRAVENOUS | Status: AC
  Administered 2022-10-09: 1250 mg via INTRAVENOUS
  Filled 2022-10-09: qty 250

## 2022-10-09 MED ORDER — SODIUM CHLORIDE 0.9 % IV SOLN
2.0000 g | Freq: Once | INTRAVENOUS | Status: DC
  Administered 2022-10-09: 2 g via INTRAVENOUS
  Filled 2022-10-09: qty 12.5

## 2022-10-09 NOTE — ED Notes (Signed)
ED TO INPATIENT HANDOFF REPORT  ED Nurse Name and Phone #: Darvin Neighbours [phone]   S Name/Age/Gender Rick [name] 89 y.o. male Room/Bed: 028C/028C  Code Status   Code Status: Prior  Home/SNF/Other Home None Is this baseline? No      Chief Complaint Shock [facility]) [R57.9]  Triage Note Patient BIB [facility] from home with complaint of altered mental status and weakness.  Family reports patient has been just laying around today & not carrying on conversations.   EMS initial CBG 40/157  EMS reports that patient has bedsore on sacrum & is on antibiotics for treatment.    Allergies Allergies  Allergen Reactions   Lisinopril     cough   Nsaids     Elevated Cr    Level of Care/Admitting Diagnosis ED Disposition     ED Disposition  Admit   Condition  --   Comment  Hospital Area: [facility] [[id number]]  Level of Care: Telemetry Medical [104]  May place patient in observation at [facility] or [facility] if equivalent level of care is available:: Yes  Covid Evaluation: Asymptomatic - no recent exposure (last 10 days) testing not required  Diagnosis: Shock [facility]) [[id number]]  Admitting Physician: Darlin Drop [[id number]]  Attending Physician: Darlin Drop [[id number]]          B Medical/Surgery History Past Medical History:  Diagnosis Date   Acute hypoxemic respiratory failure (HCC) 12/01/2020   Acute metabolic encephalopathy 07/30/2022   Acute on chronic heart failure with preserved ejection fraction (HFpEF) (HCC) 05/02/2021   Anemia 01/16/2015   Arthritis    CHF (congestive heart failure) (HCC)    GERD (gastroesophageal reflux disease)    History of colon polyps    History of prostate cancer    Hyperlipidemia    Hypertension    Kidney cysts    bilateral.  Renal US   Mitral regurgitation 12/02/2020   Past Surgical History:  Procedure Laterality Date   BIOPSY  05/05/2021   Procedure: BIOPSY;  Surgeon: Tressia Danas, MD;  Location: [facility]  ENDOSCOPY;  Service: Gastroenterology;;   COLONOSCOPY     CYSTOSCOPY  02/12/99   biopsy   ESOPHAGEAL BANDING N/A 08/04/2012   Procedure: ESOPHAGEAL BANDING;  Surgeon: Louis Meckel, MD;  Location: [facility] ENDOSCOPY;  Service: Endoscopy;  Laterality: N/A;   ESOPHAGOGASTRODUODENOSCOPY N/A 08/04/2012   Procedure: ESOPHAGOGASTRODUODENOSCOPY (EGD);  Surgeon: Louis Meckel, MD;  Location: [facility] ENDOSCOPY;  Service: Endoscopy;  Laterality: N/A;   ESOPHAGOGASTRODUODENOSCOPY (EGD) WITH PROPOFOL N/A 05/05/2021   Procedure: ESOPHAGOGASTRODUODENOSCOPY (EGD) WITH PROPOFOL;  Surgeon: Tressia Danas, MD;  Location: [facility] ENDOSCOPY;  Service: Gastroenterology;  Laterality: N/A;   ESOPHAGOGASTRODUODENOSCOPY (EGD) WITH PROPOFOL N/A 08/14/2022   Procedure: ESOPHAGOGASTRODUODENOSCOPY (EGD) WITH PROPOFOL;  Surgeon: Benancio Deeds, MD;  Location: [facility] ENDOSCOPY;  Service: Gastroenterology;  Laterality: N/A;   HEMOSTASIS CLIP PLACEMENT  08/14/2022   Procedure: HEMOSTASIS CLIP PLACEMENT;  Surgeon: Benancio Deeds, MD;  Location: [facility] ENDOSCOPY;  Service: Gastroenterology;;   HOT HEMOSTASIS N/A 08/14/2022   Procedure: HOT HEMOSTASIS (ARGON PLASMA COAGULATION/BICAP);  Surgeon: Benancio Deeds, MD;  Location: [facility] ENDOSCOPY;  Service: Gastroenterology;  Laterality: N/A;   INGUINAL HERNIA REPAIR  02/1999   Dr. Wanda Plump   POLYPECTOMY  05/05/2021   Procedure: POLYPECTOMY;  Surgeon: Tressia Danas, MD;  Location: [facility] ENDOSCOPY;  Service: Gastroenterology;;   PROSTATECTOMY  02/1999     A IV Location/Drains/Wounds Patient Lines/Drains/Airways Status     Active Line/Drains/Airways  Name Placement date Placement time Site Days   Peripheral IV 10/09/22 20 G Anterior;Distal;Right Forearm 10/09/22  2135  Forearm  less than 1   Peripheral IV 10/09/22 18 G 1.16" Anterior;Distal;Left;Upper Arm 10/09/22  2201  Arm  less than 1   External Urinary Catheter 09/02/22  --  --  37   External Urinary Catheter 10/09/22  2326  --   less than 1   Wound / Incision (Open or Dehisced) 07/24/22 Other (Comment) Coccyx Posterior 07/24/22  1500  Coccyx  77            Intake/Output Last 24 hours  Intake/Output Summary (Last 24 hours) at 10/09/2022 2327 Last data filed at 10/09/2022 2310 Gross per 24 hour  Intake 100 ml  Output --  Net 100 ml    Labs/Imaging Results for orders placed or performed during the hospital encounter of 10/09/22 (from the past 48 hour(s))  CBG monitoring, ED     Status: None   Collection Time: 10/09/22  9:41 PM  Result Value Ref Range   Glucose-Capillary 76 70 - 99 mg/dL    Comment: Glucose reference range applies only to samples taken after fasting for at least 8 hours.  Comprehensive metabolic panel     Status: Abnormal   Collection Time: 10/09/22 10:14 PM  Result Value Ref Range   Sodium 121 (L) 135 - 145 mmol/L   Potassium 2.8 (L) 3.5 - 5.1 mmol/L   Chloride 91 (L) 98 - 111 mmol/L   CO2 16 (L) 22 - 32 mmol/L   Glucose, Bld 69 (L) 70 - 99 mg/dL    Comment: Glucose reference range applies only to samples taken after fasting for at least 8 hours.   BUN 69 (H) 8 - 23 mg/dL   Creatinine, Ser 6.23 (H) 0.61 - 1.24 mg/dL   Calcium 7.3 (L) 8.9 - 10.3 mg/dL   Total Protein 6.0 (L) 6.5 - 8.1 g/dL   Albumin 1.6 (L) 3.5 - 5.0 g/dL   AST 19 15 - 41 U/L   ALT 9 0 - 44 U/L   Alkaline Phosphatase 139 (H) 38 - 126 U/L   Total Bilirubin 1.0 0.3 - 1.2 mg/dL   GFR, Estimated 8 (L) >60 mL/min    Comment: (NOTE) Calculated using the CKD-EPI Creatinine Equation (2021)    Anion gap 14 5 - 15    Comment: Performed at [facility], [address] [place], [place] [address]  Lactic acid, plasma     Status: None   Collection Time: 10/09/22 10:14 PM  Result Value Ref Range   Lactic Acid, Venous 1.2 0.5 - 1.9 mmol/L    Comment: Performed at [facility], [address], [place], [place] [address]  CBC with Differential     Status: Abnormal   Collection Time: 10/09/22 10:14 PM  Result Value  Ref Range   WBC 11.0 (H) 4.0 - 10.5 K/uL   RBC 2.45 (L) 4.22 - 5.81 MIL/uL   Hemoglobin 7.0 (L) 13.0 - 17.0 g/dL   HCT 16.0 (L) 73.7 - 10.6 %   MCV 83.7 80.0 - 100.0 fL   MCH 28.6 26.0 - 34.0 pg   MCHC 34.1 30.0 - 36.0 g/dL   RDW 26.9 (H) 48.5 - 46.2 %   Platelets 120 (L) 150 - 400 K/uL    Comment: REPEATED TO VERIFY   nRBC 0.2 0.0 - 0.2 %   Neutrophils Relative % 95 %   Neutro Abs 10.4 (H)  1.7 - 7.7 K/uL   Lymphocytes Relative 3 %   Lymphs Abs 0.4 (L) 0.7 - 4.0 K/uL   Monocytes Relative 1 %   Monocytes Absolute 0.1 0.1 - 1.0 K/uL   Eosinophils Relative 0 %   Eosinophils Absolute 0.0 0.0 - 0.5 K/uL   Basophils Relative 0 %   Basophils Absolute 0.0 0.0 - 0.1 K/uL   Immature Granulocytes 1 %   Abs Immature Granulocytes 0.05 0.00 - 0.07 K/uL    Comment: Performed at [facility], [address], [place], [place] [address]  Protime-INR     Status: Abnormal   Collection Time: 10/09/22 10:14 PM  Result Value Ref Range   Prothrombin Time 17.7 (H) 11.4 - 15.2 seconds   INR 1.5 (H) 0.8 - 1.2    Comment: (NOTE) INR goal varies based on device and disease states. Performed at [facility], [address] [place], [place] [address]   CBG monitoring, ED     Status: Abnormal   Collection Time: 10/09/22 10:25 PM  Result Value Ref Range   Glucose-Capillary 52 (L) 70 - 99 mg/dL    Comment: Glucose reference range applies only to samples taken after fasting for at least 8 hours.  CBG monitoring, ED     Status: Abnormal   Collection Time: 10/09/22 11:00 PM  Result Value Ref Range   Glucose-Capillary 137 (H) 70 - 99 mg/dL    Comment: Glucose reference range applies only to samples taken after fasting for at least 8 hours.   DG Chest Portable 1 View  Result Date: 10/09/2022 CLINICAL DATA:  Dyspnea EXAM: PORTABLE CHEST 1 VIEW COMPARISON:  09/02/2022 FINDINGS: Small left pleural effusion has developed with associated left basilar atelectasis or infiltrate. No pneumothorax. No  pleural effusion on the right. Cardiac size is stable pulmonary vascularity is normal. No acute bone abnormality. IMPRESSION: 1. Interval development of small left pleural effusion with associated left basilar atelectasis or infiltrate. Electronically Signed   By: Helyn Numbers M.D.   On: 10/09/2022 23:01    Pending Labs Unresulted Labs (From admission, onward)     Start     Ordered   10/09/22 2208  Resp panel by RT-PCR (RSV, Flu A&B, Covid) Anterior Nasal Swab  Once,   URGENT        10/09/22 2207   10/09/22 2147  Lactic acid, plasma  Now then every 2 hours,   R (with STAT occurrences)      10/09/22 2147   10/09/22 2147  Culture, blood (Routine x 2)  BLOOD CULTURE X 2,   R (with STAT occurrences)      10/09/22 2147   10/09/22 2147  Urinalysis, w/ Reflex to Culture (Infection Suspected) -Urine, Clean Catch  Once,   URGENT       Question:  Specimen Source  Answer:  Urine, Clean Catch   10/09/22 2147            Vitals/Pain Today's Vitals   10/09/22 2230 10/09/22 2240 10/09/22 2250 10/09/22 2300  BP: (!) 61/47 (!) 69/47 (!) 73/49 (!) 71/49  Pulse: (!) 50 (!) 51 (!) 54 (!) 54  Resp: 15 13 14 14   Temp:      TempSrc:      SpO2: 95% 92% 95% 95%  PainSc:        Isolation Precautions No active isolations  Medications Medications  lactated ringers infusion ( Intravenous New Bag/Given 10/09/22 2239)  metroNIDAZOLE (FLAGYL) IVPB 500 mg (500 mg  Intravenous New Bag/Given 10/09/22 2239)  vancomycin (VANCOREADY) IVPB 1250 mg/250 mL (1,250 mg Intravenous New Bag/Given 10/09/22 2248)  vancomycin variable dose per unstable renal function (pharmacist dosing) (has no administration in time range)  ceFEPIme (MAXIPIME) 1 g in sodium chloride 0.9 % 100 mL IVPB (has no administration in time range)  dextrose 50 % solution 25 g (25 g Intravenous Given 10/09/22 2230)    Mobility non-ambulatory     Focused Assessments Cardiac Assessment Handoff:    No results found for: "CKTOTAL", "CKMB",  "CKMBINDEX", "TROPONINI" No results found for: "DDIMER" Does the Patient currently have chest pain? No    R Recommendations: See Admitting Provider Note  Report given to:   Additional Notes: Patient is code sepsis, working towards comfort care.

## 2022-10-09 NOTE — Sepsis Progress Note (Signed)
Elink monitoring for the code sepsis protocol.  

## 2022-10-09 NOTE — ED Provider Notes (Signed)
[facility]-EMER[facility]ENCY DEPT [facility] Emergency Department Provider Note MRN:  [id number]  Arrival date & time: 10/09/22     Chief Complaint   Altered Mental Status and Code Sepsis   History of Present Illness   Rick [name] is a 89 y.o. year-old male presents to the ED with chief complaint of AMS.  He is accompanied by his wife.  Wife states that today he became acutely confused.  She states that he has had a significant amount of swelling of his extremities.  He has been having a coarse cough, but no measured fever.  He is noted to be hypothermic to 95.1 in triage.  History provided by family.   Review of Systems  Pertinent positive and negative review of systems noted in HPI.    Physical Exam   Vitals:   10/09/22 2250 10/09/22 2300  BP: (!) 73/49 (!) 71/49  Pulse: (!) 54 (!) 54  Resp: 14 14  Temp:    SpO2: 95% 95%    CONSTITUTIONAL:  Critically ill-appearing, NAD NEURO:  Unresponsive, GCS 5 EYES:  eyes equal and reactive ENT/NECK:  Supple, no stridor  CARDIO:  bradycardic, regular rhythm, diffuse edema in upper and lower extremities PULM:  Coarse breath sounds, wet cough GI/GU:  non-distended, abdominal mass MSK/SPINE:  No gross deformities SKIN:  sacral ulcer   *Additional and/or pertinent findings included in MDM below  Diagnostic and Interventional Summary    EKG Interpretation  Date/Time:    Ventricular Rate:    PR Interval:    QRS Duration:   QT Interval:    QTC Calculation:   R Axis:     Text Interpretation:         Labs Reviewed  COMPREHENSIVE METABOLIC PANEL - Abnormal; Notable for the following components:      Result Value   Sodium 121 (*)    Potassium 2.8 (*)    Chloride 91 (*)    CO2 16 (*)    Glucose, Bld 69 (*)    BUN 69 (*)    Creatinine, Ser 6.28 (*)    Calcium 7.3 (*)    Total Protein 6.0 (*)    Albumin 1.6 (*)    Alkaline Phosphatase 139 (*)    GFR, Estimated 8 (*)    All other components within normal limits  CBC WITH  DIFFERENTIAL/PLATELET - Abnormal; Notable for the following components:   WBC 11.0 (*)    RBC 2.45 (*)    Hemoglobin 7.0 (*)    HCT 20.5 (*)    RDW 17.5 (*)    Platelets 120 (*)    Neutro Abs 10.4 (*)    Lymphs Abs 0.4 (*)    All other components within normal limits  PROTIME-INR - Abnormal; Notable for the following components:   Prothrombin Time 17.7 (*)    INR 1.5 (*)    All other components within normal limits  CBG MONITORING, ED - Abnormal; Notable for the following components:   Glucose-Capillary 52 (*)    All other components within normal limits  CBG MONITORING, ED - Abnormal; Notable for the following components:   Glucose-Capillary 137 (*)    All other components within normal limits  CULTURE, BLOOD (ROUTINE X 2)  CULTURE, BLOOD (ROUTINE X 2)  RESP PANEL BY RT-PCR (RSV, FLU A&B, COVID)  [id number]  LACTIC ACID, PLASMA  LACTIC ACID, PLASMA  URINALYSIS, W/ REFLEX TO CULTURE (INFECTION SUSPECTED)  CBG MONITORING, ED    DG Chest Portable 1 View  Final  Result      Medications  lactated ringers infusion ( Intravenous New Bag/Given 10/09/22 2239)  metroNIDAZOLE (FLAGYL) IVPB 500 mg (500 mg Intravenous New Bag/Given 10/09/22 2239)  vancomycin (VANCOREADY) IVPB 1250 mg/250 mL (1,250 mg Intravenous New Bag/Given 10/09/22 2248)  vancomycin variable dose per unstable renal function (pharmacist dosing) (has no administration in time range)  ceFEPIme (MAXIPIME) 1 g in sodium chloride 0.9 % 100 mL IVPB (has no administration in time range)  dextrose 50 % solution 25 g (25 g Intravenous Given 10/09/22 2230)     Procedures  /  Critical Care .Critical Care  Performed by: Roxy Horseman, PA-C Authorized by: Roxy Horseman, PA-C   Critical care provider statement:    Critical care time (minutes):  35   Critical care was necessary to treat or prevent imminent or life-threatening deterioration of the following conditions:  Sepsis, shock, renal failure and respiratory failure   Critical  care was time spent personally by me on the following activities:  Development of treatment plan with patient or surrogate, discussions with consultants, evaluation of patient's response to treatment, examination of patient, ordering and review of laboratory studies, ordering and review of radiographic studies, ordering and performing treatments and interventions, pulse oximetry, re-evaluation of patient's condition and review of old charts   ED Course and Medical Decision Making  I have reviewed the triage vital signs, the nursing notes, and pertinent available records from the EMR.  Social Determinants Affecting Complexity of Care: Patient has no clinically significant social determinants affecting this chief complaint..   ED Course: Clinical Course as of 10/09/22 2312  Thu Oct 09, 2022  2251 After discussion with patient's wife and family about fluids, pressors, and antibiotics, will plan for comfort care with antibiotics.  Will hold aggressive fluids and pressors to avoid worsening discomfort with breathing.  I confirmed with the patient's wife that she'd like to keep him comfortable.  He is DNR/DNI.  We will not perform any heroic measures.  Patient's family is all at bedside and know that patient's condition is grave. [RB]    Clinical Course User Index [RB] Roxy Horseman, PA-C    Medical Decision Making Amount and/or Complexity of Data Reviewed Labs: ordered.    Details: Severe metabolic derangement leukocytosis Radiology: ordered and independent interpretation performed.    Details: Pleural effusion  Risk Prescription drug management. Decision regarding hospitalization.     Consultants: I consulted with Hospitalist, Dr. Margo Aye, who is appreciated for admitting.   Treatment and Plan: Patient's exam and diagnostic results are concerning for septic shock.  Feel that patient will need admission to the hospital for further treatment and evaluation.  Patient seen by and  discussed with attending physician, Dr. Adela Lank, who has also talked with family.  Final Clinical Impressions(s) / ED Diagnoses     ICD-10-CM   1. Septic shock (HCC)  A41.9    R65.21       ED Discharge Orders     None         Discharge Instructions Discussed with and Provided to Patient:   Discharge Instructions   None      Roxy Horseman, PA-C 10/09/22 2316    Melene Plan, DO 10/09/22 2322

## 2022-10-09 NOTE — ED Notes (Signed)
Patient had change in mental status, and vitals signs. Provider notified. Discussion with family about comfort care followed with provider.

## 2022-10-09 NOTE — Progress Notes (Signed)
Pharmacy Antibiotic Note  Rick [name] is a 89 y.o. male admitted on 10/09/2022 with  infection of unknown origin .  Pharmacy has been consulted for vancomycin and cefepime dosing.  WBC 11. Currently hypothermic and bradycardic. Lactate 1.2. Patient with severe CKD.  63.6 kg  Plan: Give vancomycin 1250 mg load. Vancomycin variable dosing for unstable renal function Check vancomycin random level in 2-3 days if still receiving vancomycin. Start cefepime 1g every 24 hours Metronidazole per MD  Temp (24hrs), Avg:95.1 F (35.1 C), Min:95.1 F (35.1 C), Max:95.1 F (35.1 C)  Recent Labs  Lab 10/09/22 2214  WBC 11.0*    CrCl cannot be calculated (Patient's most recent lab result is older than the maximum 21 days allowed.).    Allergies  Allergen Reactions   Lisinopril     cough   Nsaids     Elevated Cr    Antimicrobials this admission: Vancomycin 5/2 >>   Cefepime 5/2 >>   Metronidazole 5/2 >>  Microbiology results: 5/2 BCx: IP  Thank you for involving pharmacy in this patient's care.  Enos Fling, PharmD PGY2 Pharmacy Resident 10/09/2022 11:04 PM

## 2022-10-09 NOTE — ED Triage Notes (Signed)
Patient BIB [facility] from home with complaint of altered mental status and weakness.  Family reports patient has been just laying around today & not carrying on conversations.   EMS initial CBG 40/157  EMS reports that patient has bedsore on sacrum & is on antibiotics for treatment.

## 2022-10-10 DIAGNOSIS — E872 Acidosis, unspecified: Secondary | ICD-10-CM | POA: Diagnosis present

## 2022-10-10 DIAGNOSIS — N185 Chronic kidney disease, stage 5: Secondary | ICD-10-CM | POA: Diagnosis present

## 2022-10-10 DIAGNOSIS — R627 Adult failure to thrive: Secondary | ICD-10-CM | POA: Diagnosis not present

## 2022-10-10 DIAGNOSIS — E162 Hypoglycemia, unspecified: Secondary | ICD-10-CM | POA: Diagnosis present

## 2022-10-10 DIAGNOSIS — A4181 Sepsis due to Enterococcus: Secondary | ICD-10-CM | POA: Diagnosis present

## 2022-10-10 DIAGNOSIS — E876 Hypokalemia: Secondary | ICD-10-CM | POA: Diagnosis present

## 2022-10-10 DIAGNOSIS — D696 Thrombocytopenia, unspecified: Secondary | ICD-10-CM | POA: Diagnosis present

## 2022-10-10 DIAGNOSIS — E43 Unspecified severe protein-calorie malnutrition: Secondary | ICD-10-CM | POA: Diagnosis present

## 2022-10-10 DIAGNOSIS — Z66 Do not resuscitate: Secondary | ICD-10-CM | POA: Diagnosis present

## 2022-10-10 DIAGNOSIS — Z515 Encounter for palliative care: Principal | ICD-10-CM

## 2022-10-10 DIAGNOSIS — N400 Enlarged prostate without lower urinary tract symptoms: Secondary | ICD-10-CM | POA: Diagnosis present

## 2022-10-10 DIAGNOSIS — I5020 Unspecified systolic (congestive) heart failure: Secondary | ICD-10-CM | POA: Diagnosis not present

## 2022-10-10 DIAGNOSIS — A0472 Enterocolitis due to Clostridium difficile, not specified as recurrent: Secondary | ICD-10-CM | POA: Diagnosis not present

## 2022-10-10 DIAGNOSIS — I5043 Acute on chronic combined systolic (congestive) and diastolic (congestive) heart failure: Secondary | ICD-10-CM | POA: Diagnosis not present

## 2022-10-10 DIAGNOSIS — G9341 Metabolic encephalopathy: Secondary | ICD-10-CM | POA: Diagnosis present

## 2022-10-10 DIAGNOSIS — Z1152 Encounter for screening for COVID-19: Secondary | ICD-10-CM | POA: Diagnosis not present

## 2022-10-10 DIAGNOSIS — E871 Hypo-osmolality and hyponatremia: Secondary | ICD-10-CM | POA: Diagnosis present

## 2022-10-10 DIAGNOSIS — I132 Hypertensive heart and chronic kidney disease with heart failure and with stage 5 chronic kidney disease, or end stage renal disease: Secondary | ICD-10-CM | POA: Diagnosis present

## 2022-10-10 DIAGNOSIS — R579 Shock, unspecified: Secondary | ICD-10-CM | POA: Diagnosis present

## 2022-10-10 DIAGNOSIS — Z7189 Other specified counseling: Secondary | ICD-10-CM

## 2022-10-10 DIAGNOSIS — D509 Iron deficiency anemia, unspecified: Secondary | ICD-10-CM | POA: Diagnosis present

## 2022-10-10 DIAGNOSIS — I5042 Chronic combined systolic (congestive) and diastolic (congestive) heart failure: Secondary | ICD-10-CM | POA: Diagnosis present

## 2022-10-10 DIAGNOSIS — D631 Anemia in chronic kidney disease: Secondary | ICD-10-CM | POA: Diagnosis present

## 2022-10-10 DIAGNOSIS — R6521 Severe sepsis with septic shock: Secondary | ICD-10-CM | POA: Diagnosis present

## 2022-10-10 DIAGNOSIS — E861 Hypovolemia: Secondary | ICD-10-CM | POA: Diagnosis present

## 2022-10-10 DIAGNOSIS — E785 Hyperlipidemia, unspecified: Secondary | ICD-10-CM | POA: Diagnosis present

## 2022-10-10 DIAGNOSIS — E8809 Other disorders of plasma-protein metabolism, not elsewhere classified: Secondary | ICD-10-CM | POA: Diagnosis present

## 2022-10-10 DIAGNOSIS — N179 Acute kidney failure, unspecified: Secondary | ICD-10-CM | POA: Diagnosis present

## 2022-10-10 DIAGNOSIS — E039 Hypothyroidism, unspecified: Secondary | ICD-10-CM | POA: Diagnosis present

## 2022-10-10 LAB — CULTURE, BLOOD (ROUTINE X 2)

## 2022-10-10 LAB — RESP PANEL BY RT-PCR (RSV, FLU A&B, COVID)  RVPGX2
Influenza A by PCR: NEGATIVE
Influenza B by PCR: NEGATIVE
Resp Syncytial Virus by PCR: NEGATIVE
SARS Coronavirus 2 by RT PCR: NEGATIVE

## 2022-10-10 LAB — BLOOD CULTURE ID PANEL (REFLEXED) - BCID2

## 2022-10-10 LAB — LACTIC ACID, PLASMA: Lactic Acid, Venous: 1.5 mmol/L (ref 0.5–1.9)

## 2022-10-10 MED ORDER — HYDROMORPHONE HCL 1 MG/ML IJ SOLN: 0.5000 mg | INTRAMUSCULAR | Status: DC | PRN

## 2022-10-10 MED ORDER — POLYVINYL ALCOHOL 1.4 % OP SOLN: 1.0000 [drp] | Freq: Four times a day (QID) | OPHTHALMIC | Status: DC | PRN

## 2022-10-10 MED ORDER — LORAZEPAM 2 MG/ML PO CONC: 1.0000 mg | ORAL | Status: DC | PRN

## 2022-10-10 MED ORDER — VANCOMYCIN HCL 250 MG PO CAPS
500.0000 mg | ORAL_CAPSULE | Freq: Three times a day (TID) | ORAL | Status: DC
  Administered 2022-10-10 – 2022-10-13 (×11): 500 mg via ORAL
  Filled 2022-10-10 (×20): qty 2

## 2022-10-10 MED ORDER — PROCHLORPERAZINE MALEATE 5 MG PO TABS: 5.0000 mg | ORAL_TABLET | Freq: Four times a day (QID) | ORAL | Status: DC | PRN

## 2022-10-10 MED ORDER — METRONIDAZOLE 500 MG/100ML IV SOLN: 500.0000 mg | Freq: Three times a day (TID) | INTRAVENOUS | Status: DC

## 2022-10-10 MED ORDER — LORAZEPAM 2 MG/ML IJ SOLN: 1.0000 mg | INTRAMUSCULAR | Status: DC | PRN

## 2022-10-10 MED ORDER — SODIUM CHLORIDE 0.9 % IV SOLN
3.0000 g | INTRAVENOUS | Status: DC
  Administered 2022-10-10 – 2022-10-12 (×3): 3 g via INTRAVENOUS
  Filled 2022-10-10 (×4): qty 8

## 2022-10-10 MED ORDER — METRONIDAZOLE 500 MG/100ML IV SOLN: 500.0000 mg | Freq: Two times a day (BID) | INTRAVENOUS | Status: DC

## 2022-10-10 MED ORDER — LORAZEPAM 1 MG PO TABS: 1.0000 mg | ORAL_TABLET | ORAL | Status: DC | PRN

## 2022-10-10 MED ORDER — DEXTROSE-NACL 5-0.9 % IV SOLN: INTRAVENOUS | Status: AC

## 2022-10-10 MED ORDER — PROCHLORPERAZINE EDISYLATE 10 MG/2ML IJ SOLN: 10.0000 mg | Freq: Two times a day (BID) | INTRAMUSCULAR | Status: DC | PRN

## 2022-10-10 MED ORDER — PROCHLORPERAZINE 25 MG RE SUPP: 25.0000 mg | Freq: Two times a day (BID) | RECTAL | Status: DC | PRN

## 2022-10-10 NOTE — ED Notes (Signed)
Patient had large tarry stool. Reported to nurse. Peri-care performed and fresh blankets and diaper placed on him.

## 2022-10-10 NOTE — H&P (Signed)
History and Physical  Rick [name] ZOX:[id number] DOB: 1928-08-28 DOA: 10/09/2022  Referring physician: Vertis Kelch  PCP: Joaquim Nam, MD  Outpatient Specialists: Cardiology Patient coming from: Home  Chief Complaint: Lethargic and minimal responsiveness.  HPI: Rick [name] is a 89 y.o. male with medical history significant for HFrEF 30 to 35% with global hypokinesis, moderate to severe MR, gastric AVM, history of GI bleed, GERD, hypothyroidism, bilateral renal cysts, BPH, recent C. difficile infection, history of H. pylori infection, history of prostate cancer, anemia of chronic disease, iron deficiency anemia, hyperlipidemia, who presented to [facility] ED from home due to worsening lethargy for the past 24 to 48 hours.  He became acutely minimally responsive today.  EMS was activated.  Upon EMS arrival the patient was severely hypoglycemic with CBG 40.  The patient had bedsore on his sacrum.  In the ED, hypotensive with SBP's in the 70s, bradycardic and hypothermic.  [name] along with their 3 (they have 5 children) children present in the room made decision for comfort care only.  [name] requested that we make the patient comfortable and additionally continue IV fluid and IV antibiotics.  The patient was admitted by [facility], hospitalist service, to telemetry medical unit as observation status.  Comfort care measures are applied.  ED Course: Tmax 95.9.  BP 72/49 with MAP of 57.  Heart rate 55, respiration rate 19, O2 saturation 98% on room air.  Lab studies markable for sodium 121, potassium 2.8, serum bicarb 16, BUN 69, creatinine 6.28, calcium 7.3, alkaline phosphatase 139, albumin 1.6, total protein 6.0, GFR 8.  WBC 11.0, hemoglobin 7.0, platelet count 120.  Review of Systems: Review of systems as noted in the HPI. All other systems reviewed and are negative.   Past Medical History:  Diagnosis Date   Acute hypoxemic respiratory failure (HCC) 12/01/2020    Acute metabolic encephalopathy 07/30/2022   Acute on chronic heart failure with preserved ejection fraction (HFpEF) (HCC) 05/02/2021   Anemia 01/16/2015   Arthritis    CHF (congestive heart failure) (HCC)    GERD (gastroesophageal reflux disease)    History of colon polyps    History of prostate cancer    Hyperlipidemia    Hypertension    Kidney cysts    bilateral.  Renal US   Mitral regurgitation 12/02/2020   Past Surgical History:  Procedure Laterality Date   BIOPSY  05/05/2021   Procedure: BIOPSY;  Surgeon: Tressia Danas, MD;  Location: [facility] ENDOSCOPY;  Service: Gastroenterology;;   COLONOSCOPY     CYSTOSCOPY  02/12/99   biopsy   ESOPHAGEAL BANDING N/A 08/04/2012   Procedure: ESOPHAGEAL BANDING;  Surgeon: Louis Meckel, MD;  Location: [facility] ENDOSCOPY;  Service: Endoscopy;  Laterality: N/A;   ESOPHAGOGASTRODUODENOSCOPY N/A 08/04/2012   Procedure: ESOPHAGOGASTRODUODENOSCOPY (EGD);  Surgeon: Louis Meckel, MD;  Location: [facility] ENDOSCOPY;  Service: Endoscopy;  Laterality: N/A;   ESOPHAGOGASTRODUODENOSCOPY (EGD) WITH PROPOFOL N/A 05/05/2021   Procedure: ESOPHAGOGASTRODUODENOSCOPY (EGD) WITH PROPOFOL;  Surgeon: Tressia Danas, MD;  Location: [facility] ENDOSCOPY;  Service: Gastroenterology;  Laterality: N/A;   ESOPHAGOGASTRODUODENOSCOPY (EGD) WITH PROPOFOL N/A 08/14/2022   Procedure: ESOPHAGOGASTRODUODENOSCOPY (EGD) WITH PROPOFOL;  Surgeon: Benancio Deeds, MD;  Location: [facility] ENDOSCOPY;  Service: Gastroenterology;  Laterality: N/A;   HEMOSTASIS CLIP PLACEMENT  08/14/2022   Procedure: HEMOSTASIS CLIP PLACEMENT;  Surgeon: Benancio Deeds, MD;  Location: [facility] ENDOSCOPY;  Service: Gastroenterology;;   HOT HEMOSTASIS N/A 08/14/2022   Procedure: HOT HEMOSTASIS (ARGON PLASMA COAGULATION/BICAP);  Surgeon:  Armbruster, Willaim Rayas, MD;  Location: [facility] ENDOSCOPY;  Service: Gastroenterology;  Laterality: N/A;   INGUINAL HERNIA REPAIR  02/1999   Dr. Wanda Plump   POLYPECTOMY  05/05/2021   Procedure: POLYPECTOMY;   Surgeon: Tressia Danas, MD;  Location: [facility] ENDOSCOPY;  Service: Gastroenterology;;   PROSTATECTOMY  02/1999    Social History:  reports that he quit smoking about 21 years ago. His smoking use included cigarettes. He has quit using smokeless tobacco.  His smokeless tobacco use included chew. He reports current alcohol use. He reports that he does not use drugs.   Allergies  Allergen Reactions   Lisinopril     cough   Nsaids     Elevated Cr    Family History  Problem Relation Age of Onset   Bone cancer Sister    Stomach cancer Brother    Stomach cancer Brother    Lung cancer Sister    Colon cancer Sister    Stroke Mother    Colon cancer Other        nephew      Prior to Admission medications   Medication Sig Start Date End Date Taking? Authorizing Provider  acetaminophen (TYLENOL) 325 MG tablet Take 2 tablets (650 mg total) by mouth every 6 (six) hours as needed. Patient taking differently: Take 650 mg by mouth every 6 (six) hours as needed for moderate pain. 11/01/21   Adam Phenix, PA-C  atorvastatin (LIPITOR) 10 MG tablet Take 1 tablet (10 mg total) by mouth daily. 08/06/22   Joaquim Nam, MD  ferrous sulfate 325 (65 FE) MG tablet Take 1 tablet (325 mg total) by mouth daily with breakfast. 11/21/21 11/21/22  Joaquim Nam, MD  furosemide (LASIX) 40 MG tablet Take 1 tablet (40 mg total) by mouth daily. 06/10/22 09/11/22  Narda Bonds, MD  isosorbide-hydrALAZINE (BIDIL) 20-37.5 MG tablet Take 1 tablet by mouth 2 (two) times daily. 09/08/22 12/07/22  Almon Hercules, MD  latanoprost (XALATAN) 0.005 % ophthalmic solution Place 1 drop into both eyes at bedtime.    [provider]  levothyroxine (SYNTHROID) 50 MCG tablet Take 50 mcg by mouth daily before breakfast.    [provider]  metoprolol succinate (TOPROL-XL) 25 MG 24 hr tablet Take 0.5 tablets (12.5 mg total) by mouth daily. 08/05/22 09/05/22  Arrien, York Ram, MD  pantoprazole (PROTONIX) 40  MG tablet Take 1 tablet (40 mg total) by mouth 2 (two) times daily before a meal. 08/16/22 11/14/22  Dahal, Melina Schools, MD  tamsulosin (FLOMAX) 0.4 MG CAPS capsule Take 0.4 mg by mouth daily. 05/30/22   [provider]  timolol (TIMOPTIC) 0.5 % ophthalmic solution Place 1 drop into both eyes every morning. 08/29/22   [provider]    Physical Exam: BP (!) 80/53   Pulse (!) 51   Temp (!) 95.9 F (35.5 C) (Rectal)   Resp 17   SpO2 98%   General: 89 y.o. year-old male well developed well nourished in no acute distress.  Obtunded.   Cardiovascular: Regular rate and rhythm with no rubs or gallops.  Right JVD noted.  3+ pitting edema in lower extremities bilaterally. Respiratory: Mild rales at bases.  Poor inspiratory effort. Abdomen: Soft nontender nondistended with normal bowel sounds x4 quadrants. Muskuloskeletal: 3+ pitting edema in lower extremities bilaterally. Neuro: Unable to obtain neuroexam due to obtundation. Skin: Sacrum pressure wound, POA. Psychiatry: Unable to assess mood due to obtundation.          Labs on  Admission:  Basic Metabolic Panel: Recent Labs  Lab 10/09/22 2214  NA 121*  K 2.8*  CL 91*  CO2 16*  GLUCOSE 69*  BUN 69*  CREATININE 6.28*  CALCIUM 7.3*   Liver Function Tests: Recent Labs  Lab 10/09/22 2214  AST 19  ALT 9  ALKPHOS 139*  BILITOT 1.0  PROT 6.0*  ALBUMIN 1.6*   No results for input(s): "LIPASE", "AMYLASE" in the last 168 hours. No results for input(s): "AMMONIA" in the last 168 hours. CBC: Recent Labs  Lab 10/09/22 2214  WBC 11.0*  NEUTROABS 10.4*  HGB 7.0*  HCT 20.5*  MCV 83.7  PLT 120*   Cardiac Enzymes: No results for input(s): "CKTOTAL", "CKMB", "CKMBINDEX", "TROPONINI" in the last 168 hours.  BNP (last 3 results) Recent Labs    05/31/22 2211 07/23/22 1120 09/02/22 1307  BNP 1,180.5* 1,433.9* 622.3*    ProBNP (last 3 results) Recent Labs    02/17/22 1622  PROBNP 4,983*    CBG: Recent Labs   Lab 10/09/22 2141 10/09/22 2225 10/09/22 2300  GLUCAP 76 52* 137*    Radiological Exams on Admission: DG Chest Portable 1 View  Result Date: 10/09/2022 CLINICAL DATA:  Dyspnea EXAM: PORTABLE CHEST 1 VIEW COMPARISON:  09/02/2022 FINDINGS: Small left pleural effusion has developed with associated left basilar atelectasis or infiltrate. No pneumothorax. No pleural effusion on the right. Cardiac size is stable pulmonary vascularity is normal. No acute bone abnormality. IMPRESSION: 1. Interval development of small left pleural effusion with associated left basilar atelectasis or infiltrate. Electronically Signed   By: Helyn Numbers M.D.   On: 10/09/2022 23:01    EKG: I independently viewed the EKG done and my findings are as followed: Sinus or ectopic atrial rhythm, PVCs, prolonged PR interval, RBBB and LAFB.  Rate of 54.  QTc 508.  Assessment/Plan Present on Admission:  Shock [facility])  Principal Problem:   Shock (HCC)  Shock, SIRS, unknown source Comfort care measures are in place. Presented severely hypotensive, hypothermic, and hypoglycemic. Patient's wife and family requested comfort care, IV fluid, and IV antibiotics. Comfort measures are in place Resume IV antibiotics, Rocephin and IV Flagyl. Palliative care team consulted to assist with the management.  AKI on CKD 4 in the setting of septic shock Presented with creatinine of 6.28, baseline creatinine of 4. IV fluid continued as requested by family  Hypoglycemia in the setting of presumed septic shock Upon EMS evaluation CBG 40. D5 NS at 50 cc/h x 1 day  Recent C. difficile infection Contact/enteric precautions are in place Resume p.o. vancomycin if he becomes alert Continue IV Flagyl started in the ED.  Prolonged Qtc Admission twelve-lead EKG with QTc of 508 QTc prolonging agents are avoided. Comfort measures are in place.  Electrolytes disturbances Acute on chronic hyponatremia Acute metabolic encephalopathy/minimal  responsiveness. Metabolic acidosis Hypoalbuminemia HFrEF 30 to 35% Moderate to severe mitral regurgitation LBBB Severe protein calorie malnutrition Hypothyroidism Iron deficiency anemia, anemia of chronic disease BPH Hyperlipidemia Comfort care measures are in place. Poor prognosis. Palliative care team consulted to assist with the management. TOC consulted to assist with placement at end-of-life care.    DVT prophylaxis: Comfort care measures.  Code Status: DNR/comfort care.  Family Communication: Updated the patient's wife 2 sons and 1 daughter in the room.  Disposition Plan: Admitted to telemetry medical unit.  Consults called: Palliative care team.  Admission status: Observation status.   Status is: Observation    Darlin Drop MD [facility] Pager [phone]  If 7PM-7AM, please contact night-coverage [email or web address] Password [facility]  10/10/2022, 12:37 AM

## 2022-10-10 NOTE — Progress Notes (Signed)
Patient appears to be tolerating food with no coughing, choking or struggle. Family is feeding the patient. Plan of care is ongoing.  Lawana Pai, RN

## 2022-10-10 NOTE — Plan of Care (Signed)
Patient is resting in bed with stable vitals. No observation of laborious breathing or struggle. Respirations are normal and easy. No meds have been given PO due to patient's cognitive state. MD has verified that it is safe to NOT give anything per mouth until palliative care team assesses the patient. Comfort measures are in place, as well as safety precautions. Family is at bedside. Plan of care is ongoing.  Problem: Education: Goal: Knowledge of General Education information will improve Description: Including pain rating scale, medication(s)/side effects and non-pharmacologic comfort measures Outcome: Progressing   Problem: Health Behavior/Discharge Planning: Goal: Ability to manage health-related needs will improve Outcome: Progressing   Problem: Clinical Measurements: Goal: Ability to maintain clinical measurements within normal limits will improve Outcome: Progressing Goal: Will remain free from infection Outcome: Progressing Goal: Diagnostic test results will improve Outcome: Progressing Goal: Respiratory complications will improve Outcome: Progressing Goal: Cardiovascular complication will be avoided Outcome: Progressing   Problem: Activity: Goal: Risk for activity intolerance will decrease Outcome: Progressing   Problem: Nutrition: Goal: Adequate nutrition will be maintained Outcome: Progressing   Problem: Coping: Goal: Level of anxiety will decrease Outcome: Progressing   Problem: Elimination: Goal: Will not experience complications related to bowel motility Outcome: Progressing Goal: Will not experience complications related to urinary retention Outcome: Progressing   Problem: Pain Managment: Goal: General experience of comfort will improve Outcome: Progressing   Problem: Safety: Goal: Ability to remain free from injury will improve Outcome: Progressing   Problem: Skin Integrity: Goal: Risk for impaired skin integrity will decrease Outcome: Progressing    Problem: Education: Goal: Knowledge of the prescribed therapeutic regimen will improve Outcome: Progressing   Problem: Coping: Goal: Ability to identify and develop effective coping behavior will improve Outcome: Progressing   Problem: Clinical Measurements: Goal: Quality of life will improve Outcome: Progressing   Problem: Respiratory: Goal: Verbalizations of increased ease of respirations will increase Outcome: Progressing   Problem: Role Relationship: Goal: Family's ability to cope with current situation will improve Outcome: Progressing Goal: Ability to verbalize concerns, feelings, and thoughts to partner or family member will improve Outcome: Progressing   Problem: Pain Management: Goal: Satisfaction with pain management regimen will improve Outcome: Progressing

## 2022-10-10 NOTE — Hospital Course (Addendum)
89 yom w/ tory significant for combined systolic and diastolic CHF with lvef 30 to 35% with global hypokinesis, moderate to severe MR, CKD stage V/history of PCKD, hyponatremia, gastric AVM, history of GI bleed, GERD, hypothyroidism, bilateral renal cysts, BPH, recent C. difficile infection, history of H. pylori infection, history of prostate cancer, anemia of chronic disease, iron deficiency anemia, hyperlipidemia brought to the ED with worsening lethargy x 24 to 48 hours and being minimally responsive on the day of admission Brought to the ED: Where he has been profoundly hypotensive in 60s to 70s systolic, hypoglycemic in the 16X, hyponatremia 121 hypokalemia 2.8 acute renal failure on CKD with metabolic acidosis bicarb 16, severe hypoalbuminemia, anemia hemoglobin 7, thrombocytopenia. Discussion was held with patient and wife and other kids and decided to admit for end-of-life care and comfort measures. [facility] was called for admission. Patient was seen by palliative care> continue DNR, family wanted to proceed with comfort measures but want to continue on antibiotic regimen.  Blood culture grew E faecalis> is pansensitive. Planning for dc home w/ hospice

## 2022-10-10 NOTE — Progress Notes (Signed)
PHARMACY - PHYSICIAN COMMUNICATION CRITICAL VALUE ALERT - BLOOD CULTURE IDENTIFICATION (BCID)    Assessment:  Rick [name] is an 89 y.o. male who presented to [facility] on 10/09/2022 with a chief complaint of hypotension, bradycardia, and hypoglycemia. Possible urinary or intrabdominal source of infection.   Calculated creatinine clearance via C-G ~8 mL/min (normalized due to low weight)   5/2 Bcx: 2/4 bottles enterococcus faecalis with no resistance genes detected   Name of physician (or Provider) Contacted: Drs. Artist Beach and [place]   Current antibiotics: Vancomycin PO, flagyl 500 IV BID   Changes to prescribed antibiotics recommended: Narrowed from cefepime and flagyl to Unasyn 3g IV Q24H Recommendations accepted by provider  Results for orders placed or performed during the hospital encounter of 10/09/22  Blood Culture ID Panel (Reflexed) (Collected: 10/09/2022  9:52 PM)  Result Value Ref Range   Enterococcus faecalis DETECTED (A) NOT DETECTED   Enterococcus Faecium NOT DETECTED NOT DETECTED   Listeria monocytogenes NOT DETECTED NOT DETECTED   Staphylococcus species NOT DETECTED NOT DETECTED   Staphylococcus aureus (BCID) NOT DETECTED NOT DETECTED   Staphylococcus epidermidis NOT DETECTED NOT DETECTED   Staphylococcus lugdunensis NOT DETECTED NOT DETECTED   Streptococcus species NOT DETECTED NOT DETECTED   Streptococcus agalactiae NOT DETECTED NOT DETECTED   Streptococcus pneumoniae NOT DETECTED NOT DETECTED   Streptococcus pyogenes NOT DETECTED NOT DETECTED   A.calcoaceticus-baumannii NOT DETECTED NOT DETECTED   Bacteroides fragilis NOT DETECTED NOT DETECTED   Enterobacterales NOT DETECTED NOT DETECTED   Enterobacter cloacae complex NOT DETECTED NOT DETECTED   Escherichia coli NOT DETECTED NOT DETECTED   Klebsiella aerogenes NOT DETECTED NOT DETECTED   Klebsiella oxytoca NOT DETECTED NOT DETECTED   Klebsiella pneumoniae NOT DETECTED NOT DETECTED   Proteus species NOT  DETECTED NOT DETECTED   Salmonella species NOT DETECTED NOT DETECTED   Serratia marcescens NOT DETECTED NOT DETECTED   Haemophilus influenzae NOT DETECTED NOT DETECTED   Neisseria meningitidis NOT DETECTED NOT DETECTED   Pseudomonas aeruginosa NOT DETECTED NOT DETECTED   Stenotrophomonas maltophilia NOT DETECTED NOT DETECTED   Candida albicans NOT DETECTED NOT DETECTED   Candida auris NOT DETECTED NOT DETECTED   Candida glabrata NOT DETECTED NOT DETECTED   Candida krusei NOT DETECTED NOT DETECTED   Candida parapsilosis NOT DETECTED NOT DETECTED   Candida tropicalis NOT DETECTED NOT DETECTED   Cryptococcus neoformans/gattii NOT DETECTED NOT DETECTED   Vancomycin resistance NOT DETECTED NOT DETECTED    Jani Gravel, PharmD PGY-2 Infectious Diseases Resident  10/10/2022 3:41 PM

## 2022-10-10 NOTE — Progress Notes (Signed)
Ok to streamline abx to PO vanc/IV flagyl/Cefepime for now per Dr. Angus Seller, PharmD, BCIDP, AAHIVP, CPP Infectious Disease Pharmacist 10/10/2022 10:30 AM

## 2022-10-10 NOTE — TOC Initial Note (Signed)
Transition of Care [facility]) - Initial/Assessment Note    Patient Details  Name: Rick [name] MRN: [id number] Date of Birth: 04/27/1929  Transition of Care [facility]) CM/SW Contact:    Janae Bridgeman, RN Phone Number: 10/10/2022, 4:38 PM  Clinical Narrative:                 CM met with the patient and family at the bedside after Palliative NP, Raynelle Fanning met with the family.  The patient was admitted for Shock and the patient and family would like time to make goals of care decisions.  The patient lives with the wife and 2 adult sons at the home.  DME at the home include RW and 3:1.  The patient was awake and alert and able to participate and answer questions.  [name] was provided with Medicare choice regarding home hospice providers and the wife is leaning towards [facility] is he chooses to go home with hospice.  CM will follow up.  NP with Palliative to follow up with family/patient over the weekend.  The patient is currently active with [facility] for home health services.  CM will continue to follow the patient for return to home - likely with home hospice provider - pending patient/ family decision.  Expected Discharge Plan: Home w Hospice Care Barriers to Discharge: Continued Medical Work up   Patient Goals and CMS Choice Patient states their goals for this hospitalization and ongoing recovery are:: To return home CMS Medicare.gov Compare Post Acute Care list provided to:: Patient Choice offered to / list presented to : Patient [place] ownership interest in [facility].provided to:: Patient    Expected Discharge Plan and Services   Discharge Planning Services: CM Consult Post Acute Care Choice:  (Patient's family provided with Home Hospice providers) Living arrangements for the past 2 months: Single Family Home                                      Prior Living Arrangements/Services Living arrangements for the past 2 months:  Single Family Home Lives with:: Spouse, Adult Children (Lives with spouse and 2 Adult sons at the home) Patient language and need for interpreter reviewed:: Yes Do you feel safe going back to the place where you live?: Yes      Need for Family Participation in Patient Care: Yes (Comment) Care giver support system in place?: Yes (comment) Current home services: Home PT (Active and [facility], RW and 3:1 at the home) Criminal Activity/Legal Involvement Pertinent to Current Situation/Hospitalization: No - Comment as needed  Activities of Daily Living      Permission Sought/Granted Permission sought to share information with : Case Manager, Family Supports Permission granted to share information with : Yes, Verbal Permission Granted        Permission granted to share info w Relationship: spouse - [name] Streed     Emotional Assessment Appearance:: Appears stated age Attitude/Demeanor/Rapport: Gracious Affect (typically observed): Accepting Orientation: : Oriented to Self, Oriented to Place, Oriented to  Time, Oriented to Situation Alcohol / Substance Use: Not Applicable Psych Involvement: No (comment)  Admission diagnosis:  Shock (HCC) [R57.9] Septic shock (HCC) [A41.9, R65.21] Patient Active Problem List   Diagnosis Date Noted   Shock (HCC) 10/09/2022   Skin tear of hand without complication 09/12/2022   DNR (do not resuscitate) 09/06/2022   HFrEF (heart failure with reduced ejection fraction) (  HCC) 09/06/2022   Palliative care by specialist 09/06/2022   Acute kidney injury superimposed on chronic kidney disease (HCC) 09/03/2022   Clostridioides difficile infection 09/03/2022   Goals of care, counseling/discussion 09/03/2022   Hypoglycemia 09/03/2022   Gastric AVM 08/14/2022   Hypothyroidism 07/30/2022   Volume overload 07/23/2022   DNR (do not resuscitate) discussion 06/05/2022   Hypoalbuminemia due to protein-calorie malnutrition (HCC) 06/01/2022   Acquired hypothyroidism  06/01/2022   BPH (benign prostatic hyperplasia) 06/01/2022   Hypokalemia 05/31/2022   Metacarpal bone fracture 11/06/2021   Multiple pelvic fractures (HCC) 10/25/2021   Multiple closed pelvic fractures without disruption of pelvic ring (HCC) 10/22/2021   Gastritis and gastroduodenitis    Gastric polyp    Upper GI bleed 05/02/2021   Anemia due to stage 4 chronic kidney disease (HCC) 05/02/2021   Hyponatremia 05/02/2021   Cystic lesion of abdominal viscera 12/11/2020   Severe mitral regurgitation 12/02/2020   Demand ischemia 12/02/2020   Aortic atherosclerosis (HCC) 12/01/2020   Hypertensive urgency 12/01/2020   Polycystic kidney disease 12/01/2020   Glaucoma 12/01/2020   Diarrhea 12/01/2020   Acute on chronic combined systolic and diastolic CHF (congestive heart failure) (HCC) 11/30/2020   Shoulder pain 05/30/2020   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   Retinal hemorrhage, right eye 05/29/2020   Hypertensive retinopathy, right eye 05/29/2020   Cough 10/25/2017   Health care maintenance 10/22/2016   Anemia, iron deficiency 04/11/2015   Heme positive stool 04/11/2015   Advance care planning 01/16/2015   Benign paroxysmal positional vertigo 01/16/2015   History of prostate cancer 02/02/2014   Bilateral renal cysts 01/31/2014   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   Family history of malignant neoplasm of gastrointestinal tract 03/04/2012   Medicare annual wellness visit, subsequent 01/23/2012   VENTRAL HERNIA, ASYMPTOMATIC 03/09/2008   TOBACCO ABUSE, HX OF 02/23/2007   HELICOBACTER PYLORI INFECTION 11/25/2006   HYPERCHOLESTEROLEMIA 11/25/2006   Essential hypertension 11/25/2006   GERD without esophagitis 11/25/2006   Hyperglycemia 11/25/2006   PCP:  Joaquim Nam, MD Pharmacy:   [id number] [place], [place] - [address] [place] [address] Phone: [phone] Fax: [phone]  [facility] [address] [place] [address] Phone: [phone] Fax: [phone]     Social Determinants of Health (SDOH) Social History: SDOH Screenings   Food Insecurity: No Food Insecurity (09/10/2022)  Housing: Low Risk  (09/10/2022)  Transportation Needs: No Transportation Needs (09/10/2022)  Utilities: Not At Risk (09/03/2022)  Alcohol Screen: Low Risk  (11/27/2021)  Depression (PHQ2-9): Low Risk  (09/01/2022)  Financial Resource Strain: Low Risk  (11/27/2021)  Physical Activity: Insufficiently Active (11/27/2021)  Social Connections: Socially Integrated (11/27/2021)  Stress: No Stress Concern Present (11/27/2021)  Tobacco Use: Medium Risk (10/09/2022)   SDOH Interventions:     Readmission Risk Interventions    10/10/2022    4:35 PM 09/03/2022    4:07 PM 07/25/2022    4:19 PM  Readmission Risk Prevention Plan  Transportation Screening Complete Complete Complete  PCP or Specialist Appt within 3-5 Days   Complete  HRI or Home Care Consult   Not Complete  HRI or Home Care Consult comments   need pt eval  Social Work Consult for Recovery Care Planning/Counseling   Not Complete  SW consult not completed comments   need pt eval  Palliative Care Screening   Not Applicable  Medication Review Oceanographer) Complete  Complete Complete  PCP or Specialist appointment within 3-5 days of discharge Complete Complete   HRI or Home Care Consult Complete Complete   SW Recovery Care/Counseling Consult Complete    Palliative Care Screening Complete Not Applicable   Skilled Nursing Facility Not Applicable Not Applicable

## 2022-10-10 NOTE — Consult Note (Signed)
Palliative Care Consult Note                                  Date: 10/10/2022   Patient Name: Rick [name]  DOB: 1928/09/25  MRN: [id number]  Age / Sex: 89 y.o., male  PCP: Joaquim Nam, MD Referring Physician: Lanae Boast, MD  Reason for Consultation: {Reason for Consult:23484}  HPI/Patient Profile: 89 y.o. male  with past medical history of chronic HFrEF, moderate to severe MR, history of prostate cancer, CKD stage V admitted on 10/09/2022 with ***.   Past Medical History:  Diagnosis Date   Acute hypoxemic respiratory failure (HCC) 12/01/2020   Acute metabolic encephalopathy 07/30/2022   Acute on chronic heart failure with preserved ejection fraction (HFpEF) (HCC) 05/02/2021   Anemia 01/16/2015   Arthritis    CHF (congestive heart failure) (HCC)    GERD (gastroesophageal reflux disease)    History of colon polyps    History of prostate cancer    Hyperlipidemia    Hypertension    Kidney cysts    bilateral.  Renal US   Mitral regurgitation 12/02/2020    Subjective:   I have reviewed medical records including progress notes, labs and imaging, and assessed the patient at bedside. He is awake and appears to be resting comfortably in bed. He denies pain.   I met with *** to discuss diagnosis, prognosis, GOC, EOL wishes, disposition, and options.  Patient is known to PMT from previous hospitalizations. I re-introduced Palliative Medicine as specialized medical care for people living with serious illness. It focuses on providing relief from the symptoms and stress of a serious illness.   We discussed patient's current illness and what it means in the larger context of his/her ongoing co-morbidities. Current clinical status was reviewed. Natural disease trajectory of *** was discussed.  Created space and opportunity for patient and family to explore thoughts and feelings regarding current medical situation.  Values and goals of  care important to patient and family were attempted to be elicited.  A discussion was had today regarding advanced directives. Concepts specific to code status, artifical feeding and hydration, continued IV antibiotics and rehospitalization was had.  The MOST form was introduced and discussed.  Questions and concerns addressed. Patient/family encouraged to call with questions or concerns.       Review of Systems  Objective:   Primary Diagnoses: Present on Admission:  Shock (HCC)   Physical Exam  Vital Signs:  BP (!) 82/48 (BP Location: Right Arm)   Pulse (!) 51   Temp (!) 95.9 F (35.5 C) (Rectal)   Resp 18   SpO2 98%   Palliative Assessment/Data: ***     Assessment & Plan:   SUMMARY OF RECOMMENDATIONS   ***  Primary Decision Maker: {Primary Decision WUJWJ:19147}  Code Status/Advance Care Planning: {Palliative Code status:23503}  Symptom Management:  ***  Prognosis:  {Palliative Care Prognosis:23504}  Discharge Planning:  {Palliative dispostion:23505}   Discussed with: ***    Thank you for allowing [place] to participate in the care of Rick [name]   Time Total: ***  Greater than 50%  of this time was spent counseling and coordinating care related to the above assessment and plan.  Signed by: Sherlean Foot, NP Palliative Medicine Team  Team Phone # [phone]  For individual providers, please see AMION

## 2022-10-10 NOTE — Progress Notes (Signed)
Patient seen and examined personally, I reviewed the chart, history and physical and admission note, done by admitting physician this morning and agree with the same with following addendum.  Please refer to the morning admission note for more detailed plan of care.  Briefly,  89 yom w/ tory significant for combined systolic and diastolic CHF with lvef 30 to 35% with global hypokinesis, moderate to severe MR, CKD stage V/history of PCKD, hyponatremia, gastric AVM, history of GI bleed, GERD, hypothyroidism, bilateral renal cysts, BPH, recent C. difficile infection, history of H. pylori infection, history of prostate cancer, anemia of chronic disease, iron deficiency anemia, hyperlipidemia brought to the ED with worsening lethargy x 24 to 48 hours and being minimally responsive on the day of admission Brought to the ED: Where he has been profoundly hypotensive in 60s to 70s systolic, hypoglycemic in the 03K, hyponatremia 121 hypokalemia 2.8 acute renal failure on CKD with metabolic acidosis bicarb 16, severe hypoalbuminemia, anemia hemoglobin 7, thrombocytopenia. Discussion was held with patient and wife and other kids and decided to admit for end-of-life care and comfort measures. TRH was called for admission     Seen and examined this morning patient is lethargic able to open his eyes tell me his name Abdomen appears distended, bilateral crackles on exam His wife and daughter outside the room and extensively discussed  Issues End-of-life care Failure to thrive secondary to severe protein calorie malnutrition/CHF/CKD Shock with hypotension hypothermia hypoglycemia: Likely multifactorial hypovolemic/septic,?  Cardiogenic Combined systolic and diastolic CHF with EF 30-35% AKI on CKD stage V Hypoglycemia Hypothermia Hyponatremia Metabolic acidosis Severe hypoalbuminemia Recent C. difficile infection Prolonged Qtc/LBBB Severe anemia Hypothyroidism Iron deficiency  anemia BPH Hyperlipidemia Moderate to severe MR  Plan: Extensively discussed with patient's wife and daughter-wife is reluctant on discharge to hospice house. She is wondering about taking him home, not sure if he is stable for discharge back to home,they would like to meet with palliative care, for now continue current end-of-life care. Patient admitted on empiric antibiotics minimize vancomycin due to renal failure-pending further palliative EVAL.

## 2022-10-11 DIAGNOSIS — R579 Shock, unspecified: Secondary | ICD-10-CM | POA: Diagnosis not present

## 2022-10-11 LAB — CULTURE, BLOOD (ROUTINE X 2)

## 2022-10-11 NOTE — Plan of Care (Signed)

## 2022-10-11 NOTE — Progress Notes (Signed)
PROGRESS NOTE Rick [name]  WUJ:[id number] DOB: August 09, 1928 DOA: 10/09/2022 PCP: Joaquim Nam, MD  Brief Narrative/Hospital Course: 83 yom w/ tory significant for combined systolic and diastolic CHF with lvef 30 to 35% with global hypokinesis, moderate to severe MR, CKD stage V/history of PCKD, hyponatremia, gastric AVM, history of GI bleed, GERD, hypothyroidism, bilateral renal cysts, BPH, recent C. difficile infection, history of H. pylori infection, history of prostate cancer, anemia of chronic disease, iron deficiency anemia, hyperlipidemia brought to the ED with worsening lethargy x 24 to 48 hours and being minimally responsive on the day of admission Brought to the ED: Where he has been profoundly hypotensive in 60s to 70s systolic, hypoglycemic in the 95A, hyponatremia 121 hypokalemia 2.8 acute renal failure on CKD with metabolic acidosis bicarb 16, severe hypoalbuminemia, anemia hemoglobin 7, thrombocytopenia. Discussion was held with patient and wife and other kids and decided to admit for end-of-life care and comfort measures. TRH was called for admission    Subjective: Patient seen and examined this morning wife and daughter at the bedside he appears more alert awake this morning Daughter is feeding Appears comfortable  Assessment and Plan: Principal Problem:   Shock (HCC)   End-of-life care Failure to thrive secondary to severe protein calorie malnutrition/CHF/CKD Shock with hypotension hypothermia hypoglycemia:multifactorial hypovolemic/septic shock. E faecalis bacteremia Combined systolic and diastolic CHF with EF 30-35% AKI on CKD stage V Hypoglycemia Hypothermia Hyponatremia Metabolic acidosis Severe hypoalbuminemia Recent C. difficile infection Prolonged Qtc/LBBB Severe anemia Hypothyroidism Iron deficiency anemia BPH Hyperlipidemia Moderate to severe MR   Plan: Appreciate palliative care input on board. Family would like to continue antibiotics for now  continue Unasyn per ID recommendation, continue his vancomycin for his recent C. difficile infection.  Appears more perked up alert awake and able to feed. Family contemplating on taking home with hospice-I had further discussion with palliative care.  DVT prophylaxis: none for comport Code Status:   Code Status: DNR Family Communication: plan of care discussed with patient/wife and daughter at bedside. Patient status is:  inpatient  because of end of life care Level of care: Telemetry Medical   Dispo: The patient is from: home            Anticipated disposition: hospice home vs inpatient Objective: Vitals last 24 hrs: Vitals:   10/10/22 0020 10/10/22 0804 10/10/22 2032 10/11/22 0807  BP: (!) 80/53 (!) 82/48 102/65 101/67  Pulse: (!) 51 (!) 51 (!) 59 65  Resp: 17 18 16    Temp:   98.3 F (36.8 C) 98.6 F (37 C)  TempSrc:      SpO2: 98% 98% 100% 96%   Weight change:   Physical Examination: General exam: alert awake, elderly frail HEENT:Oral mucosa moist, Ear/Nose WNL grossly Respiratory system: bilaterally clear BS, +no use of accessory muscle, mildly tachypneic Cardiovascular system: S1 & S2 +, No JVD. Gastrointestinal system: Abdomen soft, distended, BS+ Nervous System:Alert, awake, moving extremities. Extremities: LE edema mild,distal peripheral pulses palpable.  Skin: No rashes,no icterus. MSK: Normal muscle bulk,tone, power  Medications reviewed:  Scheduled Meds:  vancomycin  500 mg Oral TID AC & HS   Continuous Infusions:  ampicillin-sulbactam (UNASYN) IV Stopped (10/10/22 1817)      Diet Order             Diet regular Room service appropriate? Yes; Fluid consistency: Thin  Diet effective now                  Intake/Output Summary (Last 24  hours) at 10/11/2022 1051 Last data filed at 10/11/2022 0736 Gross per 24 hour  Intake 1789.66 ml  Output 30 ml  Net 1759.66 ml   Net IO Since Admission: 2,209.66 mL [10/11/22 1051]  Wt Readings from Last 3  Encounters:  09/08/22 63.6 kg  09/01/22 65.8 kg  08/21/22 62.1 kg     Unresulted Labs (From admission, onward)     Start     Ordered   10/09/22 2147  Urinalysis, w/ Reflex to Culture (Infection Suspected) -Urine, Clean Catch  Once,   URGENT       Question:  Specimen Source  Answer:  Urine, Clean Catch   10/09/22 2147          Data Reviewed: I have personally reviewed following labs and imaging studies CBC: Recent Labs  Lab 10/09/22 2214  WBC 11.0*  NEUTROABS 10.4*  HGB 7.0*  HCT 20.5*  MCV 83.7  PLT 120*   Basic Metabolic Panel: Recent Labs  Lab 10/09/22 2214  NA 121*  K 2.8*  CL 91*  CO2 16*  GLUCOSE 69*  BUN 69*  CREATININE 6.28*  CALCIUM 7.3*   GFR: CrCl cannot be calculated (Unknown ideal weight.). Liver Function Tests: Recent Labs  Lab 10/09/22 2214  AST 19  ALT 9  ALKPHOS 139*  BILITOT 1.0  PROT 6.0*  ALBUMIN 1.6*   Recent Labs  Lab 10/09/22 2214  INR 1.5*   BNP (last 3 results) Recent Labs    02/17/22 1622  PROBNP 4,983*  HbA1C: No results for input(s): "HGBA1C" in the last 72 hours. CBG: Recent Labs  Lab 10/09/22 2141 10/09/22 2225 10/09/22 2300  GLUCAP 76 52* 137*   Recent Labs  Lab 10/09/22 2214 10/10/22 0115  LATICACIDVEN 1.2 1.5    Recent Results (from the past 240 hour(s))  Culture, blood (Routine x 2)     Status: None (Preliminary result)   Collection Time: 10/09/22  9:47 PM   Specimen: BLOOD LEFT ARM  Result Value Ref Range Status   Specimen Description BLOOD LEFT ARM  Final   Special Requests   Final    BOTTLES DRAWN AEROBIC AND ANAEROBIC Blood Culture results may not be optimal due to an inadequate volume of blood received in culture bottles   Culture  Setup Time   Final    GRAM POSITIVE COCCI IN BOTH AEROBIC AND ANAEROBIC BOTTLES CRITICAL RESULT CALLED TO, READ BACK BY AND VERIFIED WITH: PHARMD AUSTIN PAYTES ON 10/10/2022 @ 1516 BY DRT    Culture   Final    GRAM POSITIVE COCCI IDENTIFICATION TO  FOLLOW Performed at [facility], [address] [place], [place] [address]    Report Status PENDING  Incomplete  Culture, blood (Routine x 2)     Status: Abnormal (Preliminary result)   Collection Time: 10/09/22  9:52 PM   Specimen: BLOOD  Result Value Ref Range Status   Specimen Description BLOOD RIGHT ANTECUBITAL  Final   Special Requests   Final    BOTTLES DRAWN AEROBIC AND ANAEROBIC Blood Culture results may not be optimal due to an inadequate volume of blood received in culture bottles   Culture  Setup Time   Final    GRAM POSITIVE COCCI IN CHAINS ANAEROBIC BOTTLE ONLY CRITICAL RESULT CALLED TO, READ BACK BY AND VERIFIED WITH: PHARMD AUSTIN PAYTES ON 10/10/2022 @ 1516 BY DRT    Culture (A)  Final    ENTEROCOCCUS FAECALIS SUSCEPTIBILITIES TO FOLLOW Performed at [facility], [address] [place], [place] [address]    Report Status PENDING  Incomplete  Blood Culture ID Panel (Reflexed)     Status: Abnormal   Collection Time: 10/09/22  9:52 PM  Result Value Ref Range Status   Enterococcus faecalis DETECTED (A) NOT DETECTED Final    Comment: CRITICAL RESULT CALLED TO, READ BACK BY AND VERIFIED WITH: PHARMD AUSTIN PAYTES ON 10/10/2022 @ 1516 BY DRT    Enterococcus Faecium NOT DETECTED NOT DETECTED Final   Listeria monocytogenes NOT DETECTED NOT DETECTED Final   Staphylococcus species NOT DETECTED NOT DETECTED Final   Staphylococcus aureus (BCID) NOT DETECTED NOT DETECTED Final   Staphylococcus epidermidis NOT DETECTED NOT DETECTED Final   Staphylococcus lugdunensis NOT DETECTED NOT DETECTED Final   Streptococcus species NOT DETECTED NOT DETECTED Final   Streptococcus agalactiae NOT DETECTED NOT DETECTED Final   Streptococcus pneumoniae NOT DETECTED NOT DETECTED Final   Streptococcus pyogenes NOT DETECTED NOT DETECTED Final   A.calcoaceticus-baumannii NOT DETECTED NOT DETECTED Final   Bacteroides fragilis NOT DETECTED NOT DETECTED Final   Enterobacterales NOT  DETECTED NOT DETECTED Final   Enterobacter cloacae complex NOT DETECTED NOT DETECTED Final   Escherichia coli NOT DETECTED NOT DETECTED Final   Klebsiella aerogenes NOT DETECTED NOT DETECTED Final   Klebsiella oxytoca NOT DETECTED NOT DETECTED Final   Klebsiella pneumoniae NOT DETECTED NOT DETECTED Final   Proteus species NOT DETECTED NOT DETECTED Final   Salmonella species NOT DETECTED NOT DETECTED Final   Serratia marcescens NOT DETECTED NOT DETECTED Final   Haemophilus influenzae NOT DETECTED NOT DETECTED Final   Neisseria meningitidis NOT DETECTED NOT DETECTED Final   Pseudomonas aeruginosa NOT DETECTED NOT DETECTED Final   Stenotrophomonas maltophilia NOT DETECTED NOT DETECTED Final   Candida albicans NOT DETECTED NOT DETECTED Final   Candida auris NOT DETECTED NOT DETECTED Final   Candida glabrata NOT DETECTED NOT DETECTED Final   Candida krusei NOT DETECTED NOT DETECTED Final   Candida parapsilosis NOT DETECTED NOT DETECTED Final   Candida tropicalis NOT DETECTED NOT DETECTED Final   Cryptococcus neoformans/gattii NOT DETECTED NOT DETECTED Final   Vancomycin resistance NOT DETECTED NOT DETECTED Final    Comment: Performed at [facility], [address], [place], [place] [address]  Resp panel by RT-PCR (RSV, Flu A&B, Covid) Anterior Nasal Swab     Status: None   Collection Time: 10/09/22 10:21 PM   Specimen: Anterior Nasal Swab  Result Value Ref Range Status   SARS Coronavirus 2 by RT PCR NEGATIVE NEGATIVE Final   Influenza A by PCR NEGATIVE NEGATIVE Final   Influenza B by PCR NEGATIVE NEGATIVE Final    Comment: (NOTE) The Xpert Xpress SARS-CoV-2/FLU/RSV plus assay is intended as an aid in the diagnosis of influenza from Nasopharyngeal swab specimens and should not be used as a sole basis for treatment. Nasal washings and aspirates are unacceptable for Xpert Xpress SARS-CoV-2/FLU/RSV testing.  Fact Sheet for Patients: BloggerCourse.com  Fact  Sheet for Healthcare Providers: SeriousBroker.it  This test is not yet approved or cleared by the [place] FDA and has been authorized for detection and/or diagnosis of SARS-CoV-2 by FDA under an Emergency Use Authorization (EUA). This EUA will remain in effect (meaning this test can be used) for the duration of the COVID-19 declaration under Section 564(b)(1) of the Act, 21 U.S.C. section 360bbb-3(b)(1), unless the authorization is terminated or revoked.     Resp Syncytial Virus by PCR NEGATIVE NEGATIVE Final    Comment: (NOTE)  Fact Sheet for Patients: BloggerCourse.com  Fact Sheet for Healthcare Providers: SeriousBroker.it  This test is not yet approved or cleared by the [place] FDA and has been authorized for detection and/or diagnosis of SARS-CoV-2 by FDA under an Emergency Use Authorization (EUA). This EUA will remain in effect (meaning this test can be used) for the duration of the COVID-19 declaration under Section 564(b)(1) of the Act, 21 U.S.C. section 360bbb-3(b)(1), unless the authorization is terminated or revoked.  Performed at [facility], [address], [place], [place] [address]     Antimicrobials: Anti-infectives (From admission, onward)    Start     Dose/Rate Route Frequency Ordered Stop   10/10/22 2200  ceFEPIme (MAXIPIME) 1 g in sodium chloride 0.9 % 100 mL IVPB  Status:  Discontinued        1 g 200 mL/hr over 30 Minutes Intravenous Every 24 hours 10/09/22 2306 10/10/22 1540   10/10/22 1800  metroNIDAZOLE (FLAGYL) IVPB 500 mg  Status:  Discontinued        500 mg 100 mL/hr over 60 Minutes Intravenous Every 8 hours 10/10/22 1030 10/10/22 1540   10/10/22 1630  Ampicillin-Sulbactam (UNASYN) 3 g in sodium chloride 0.9 % 100 mL IVPB        3 g 200 mL/hr over 30 Minutes Intravenous Every 24 hours 10/10/22 1540     10/10/22 1000  metroNIDAZOLE (FLAGYL) IVPB 500 mg   Status:  Discontinued        500 mg 100 mL/hr over 60 Minutes Intravenous Every 12 hours 10/10/22 0112 10/10/22 1030   10/10/22 0230  vancomycin (VANCOCIN) capsule 500 mg        500 mg Oral 3 times daily before meals & bedtime 10/10/22 0124 10/23/22 2159   10/09/22 2305  vancomycin variable dose per unstable renal function (pharmacist dosing)  Status:  Discontinued         Does not apply See admin instructions 10/09/22 2306 10/10/22 1027   10/09/22 2230  vancomycin (VANCOREADY) IVPB 1250 mg/250 mL        1,250 mg 166.7 mL/hr over 90 Minutes Intravenous  Once 10/09/22 2217 10/10/22 0030   10/09/22 2215  ceFEPIme (MAXIPIME) 2 g in sodium chloride 0.9 % 100 mL IVPB  Status:  Discontinued        2 g 200 mL/hr over 30 Minutes Intravenous  Once 10/09/22 2207 10/09/22 2310   10/09/22 2215  metroNIDAZOLE (FLAGYL) IVPB 500 mg        500 mg 100 mL/hr over 60 Minutes Intravenous  Once 10/09/22 2207 10/09/22 2341   10/09/22 2215  vancomycin (VANCOCIN) IVPB 1000 mg/200 mL premix  Status:  Discontinued        1,000 mg 200 mL/hr over 60 Minutes Intravenous  Once 10/09/22 2207 10/09/22 2217      Culture/Microbiology    Component Value Date/Time   SDES BLOOD RIGHT ANTECUBITAL 10/09/2022 2152   SPECREQUEST  10/09/2022 2152    BOTTLES DRAWN AEROBIC AND ANAEROBIC Blood Culture results may not be optimal due to an inadequate volume of blood received in culture bottles   CULT (A) 10/09/2022 2152    ENTEROCOCCUS FAECALIS SUSCEPTIBILITIES TO FOLLOW Performed at [facility], [address] [place], [place] [address]    REPTSTATUS PENDING 10/09/2022 2152    Radiology Studies: DG Chest Portable 1 View  Result Date: 10/09/2022 CLINICAL DATA:  Dyspnea EXAM: PORTABLE CHEST 1 VIEW COMPARISON:  09/02/2022 FINDINGS: Small left pleural effusion has developed with  associated left basilar atelectasis or infiltrate. No pneumothorax. No pleural effusion on the right. Cardiac size is stable pulmonary  vascularity is normal. No acute bone abnormality. IMPRESSION: 1. Interval development of small left pleural effusion with associated left basilar atelectasis or infiltrate. Electronically Signed   By: Helyn Numbers M.D.   On: 10/09/2022 23:01     LOS: 1 day   Lanae Boast, MD [facility]  10/11/2022, 10:51 AM

## 2022-10-12 DIAGNOSIS — Q613 Polycystic kidney, unspecified: Secondary | ICD-10-CM

## 2022-10-12 DIAGNOSIS — N185 Chronic kidney disease, stage 5: Secondary | ICD-10-CM

## 2022-10-12 DIAGNOSIS — H409 Unspecified glaucoma: Secondary | ICD-10-CM

## 2022-10-12 DIAGNOSIS — N400 Enlarged prostate without lower urinary tract symptoms: Secondary | ICD-10-CM

## 2022-10-12 DIAGNOSIS — K219 Gastro-esophageal reflux disease without esophagitis: Secondary | ICD-10-CM

## 2022-10-12 DIAGNOSIS — Z87891 Personal history of nicotine dependence: Secondary | ICD-10-CM

## 2022-10-12 DIAGNOSIS — D631 Anemia in chronic kidney disease: Secondary | ICD-10-CM

## 2022-10-12 DIAGNOSIS — R579 Shock, unspecified: Secondary | ICD-10-CM | POA: Diagnosis not present

## 2022-10-12 DIAGNOSIS — D62 Acute posthemorrhagic anemia: Secondary | ICD-10-CM

## 2022-10-12 DIAGNOSIS — I5043 Acute on chronic combined systolic (congestive) and diastolic (congestive) heart failure: Secondary | ICD-10-CM | POA: Diagnosis not present

## 2022-10-12 DIAGNOSIS — I132 Hypertensive heart and chronic kidney disease with heart failure and with stage 5 chronic kidney disease, or end stage renal disease: Secondary | ICD-10-CM | POA: Diagnosis not present

## 2022-10-12 DIAGNOSIS — E039 Hypothyroidism, unspecified: Secondary | ICD-10-CM

## 2022-10-12 DIAGNOSIS — A0472 Enterocolitis due to Clostridium difficile, not specified as recurrent: Secondary | ICD-10-CM | POA: Diagnosis not present

## 2022-10-12 DIAGNOSIS — Z86010 Personal history of colonic polyps: Secondary | ICD-10-CM

## 2022-10-12 DIAGNOSIS — K317 Polyp of stomach and duodenum: Secondary | ICD-10-CM

## 2022-10-12 DIAGNOSIS — Z8546 Personal history of malignant neoplasm of prostate: Secondary | ICD-10-CM

## 2022-10-12 DIAGNOSIS — E871 Hypo-osmolality and hyponatremia: Secondary | ICD-10-CM | POA: Diagnosis not present

## 2022-10-12 DIAGNOSIS — E7800 Pure hypercholesterolemia, unspecified: Secondary | ICD-10-CM

## 2022-10-12 DIAGNOSIS — K31819 Angiodysplasia of stomach and duodenum without bleeding: Secondary | ICD-10-CM

## 2022-10-12 DIAGNOSIS — Z9181 History of falling: Secondary | ICD-10-CM

## 2022-10-12 LAB — CULTURE, BLOOD (ROUTINE X 2)

## 2022-10-12 NOTE — Progress Notes (Signed)
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 difficile infection, history of H. pylori infection, history of prostate cancer, anemia of chronic disease, iron deficiency anemia, hyperlipidemia brought to the ED with worsening lethargy x 24 to 48 hours and being minimally responsive on the day of admission Brought to the ED: Where he has been profoundly hypotensive in 60s to 70s systolic, hypoglycemic in the 81X, hyponatremia 121 hypokalemia 2.8 acute renal failure on CKD with metabolic acidosis bicarb 16, severe hypoalbuminemia, anemia hemoglobin 7, thrombocytopenia. Discussion was held with patient and wife and other kids and decided to admit for end-of-life care and comfort measures. TRH was called for admission. Patient was seen by palliative care> continue DNR, family wanted to proceed with comfort measures but want to continue on antibiotic regimen.  Blood culture grew E faecalis  Subjective: Patient seen and examined this morning wife daughter and son present.  He is more alert awake following commands about to start his breakfast He had a bowel movement yesterday Appears very weak Abdomen still distended  Assessment and Plan: Principal Problem:   Shock (HCC)   End-of-life care Failure to thrive secondary to severe protein calorie malnutrition/CHF/CKD Shock with hypotension hypothermia hypoglycemia:multifactorial hypovolemic/septic shock. E faecalis bacteremia Combined systolic and diastolic CHF with EF 30-35% AKI on CKD stage V Hypoglycemia Hypothermia Hyponatremia Metabolic acidosis Severe hypoalbuminemia Recent C.  difficile infection Prolonged Qtc/LBBB Severe anemia Hypothyroidism Iron deficiency anemia BPH Hyperlipidemia Moderate to severe MR   Plan: Appreciate palliative care input on board.  Continue current comfort measures/supportive cares, pleasure feeding. Family would like to continue antibiotics for now. On iv Unasyn per ID recommendation BC ID shows E faecalis, C/S pending once oral regimen available will switch to p.o. and discharge on PO regimen Family contemplating on taking home with hospice.  DVT prophylaxis: none for comport Code Status:   Code Status: DNR Family Communication: plan of care discussed with patient/wife and daughter at bedside. Patient status is:  inpatient  because of end of life care Level of care: Telemetry Medical   Dispo: The patient is from: home            Anticipated disposition: hospice home vs inpatient Objective: Vitals last 24 hrs: Vitals:   10/11/22 2115 10/11/22 2351 10/11/22 2351 10/12/22 0946  BP: 110/67   108/69  Pulse: 71 65 65 61  Resp: 13   15  Temp: 97.9 F (36.6 C)     TempSrc:      SpO2: (!) 77% 100% 100% 98%   Weight change:   Physical Examination: General exam: AA, weak,older appearing HEENT:Oral mucosa moist, Ear/Nose WNL grossly, dentition normal. Respiratory system: bilaterally diminished BS Cardiovascular system: S1 & S2 +, regular rate. Gastrointestinal system: Abdomen soft, distended,BS+ Nervous System:Alert, awake, moving extremities and grossly nonfocal Extremities: LE ankle edema +, lower extremities warm Skin: No rashes,no icterus. MSK: weak muscle low tone, power   Medications reviewed:  Scheduled Meds:  vancomycin  500 mg Oral TID AC & HS   Continuous Infusions:  ampicillin-sulbactam (UNASYN) IV 3 g (10/11/22 1651)      Diet Order             Diet  regular Room service appropriate? Yes; Fluid consistency: Thin  Diet effective now                  Intake/Output Summary (Last 24 hours) at  10/12/2022 1139 Last data filed at 10/12/2022 0745 Gross per 24 hour  Intake 60 ml  Output 20 ml  Net 40 ml   Net IO Since Admission: 2,249.66 mL [10/12/22 1139]  Wt Readings from Last 3 Encounters:  09/08/22 63.6 kg  09/01/22 65.8 kg  08/21/22 62.1 kg     Unresulted Labs (From admission, onward)     Start     Ordered   10/09/22 2147  Urinalysis, w/ Reflex to Culture (Infection Suspected) -Urine, Clean Catch  Once,   URGENT       Question:  Specimen Source  Answer:  Urine, Clean Catch   10/09/22 2147          Data Reviewed: I have personally reviewed following labs and imaging studies CBC: Recent Labs  Lab 10/09/22 2214  WBC 11.0*  NEUTROABS 10.4*  HGB 7.0*  HCT 20.5*  MCV 83.7  PLT 120*   Basic Metabolic Panel: Recent Labs  Lab 10/09/22 2214  NA 121*  K 2.8*  CL 91*  CO2 16*  GLUCOSE 69*  BUN 69*  CREATININE 6.28*  CALCIUM 7.3*   GFR: CrCl cannot be calculated (Unknown ideal weight.). Liver Function Tests: Recent Labs  Lab 10/09/22 2214  AST 19  ALT 9  ALKPHOS 139*  BILITOT 1.0  PROT 6.0*  ALBUMIN 1.6*   Recent Labs  Lab 10/09/22 2214  INR 1.5*   BNP (last 3 results) Recent Labs    02/17/22 1622  PROBNP 4,983*  HbA1C: No results for input(s): "HGBA1C" in the last 72 hours. CBG: Recent Labs  Lab 10/09/22 2141 10/09/22 2225 10/09/22 2300  GLUCAP 76 52* 137*   Recent Labs  Lab 10/09/22 2214 10/10/22 0115  LATICACIDVEN 1.2 1.5    Recent Results (from the past 240 hour(s))  Culture, blood (Routine x 2)     Status: Abnormal   Collection Time: 10/09/22  9:47 PM   Specimen: BLOOD LEFT ARM  Result Value Ref Range Status   Specimen Description BLOOD LEFT ARM  Final   Special Requests   Final    BOTTLES DRAWN AEROBIC AND ANAEROBIC Blood Culture results may not be optimal due to an inadequate volume of blood received in culture bottles   Culture  Setup Time   Final    GRAM POSITIVE COCCI IN BOTH AEROBIC AND ANAEROBIC  BOTTLES CRITICAL RESULT CALLED TO, READ BACK BY AND VERIFIED WITH: PHARMD AUSTIN PAYTES ON 10/10/2022 @ 1516 BY DRT    Culture (A)  Final    ENTEROCOCCUS FAECALIS SUSCEPTIBILITIES PERFORMED ON PREVIOUS CULTURE WITHIN THE LAST 5 DAYS. Performed at [facility], [address] [place], [place] [address]    Report Status 10/12/2022 FINAL  Final  Culture, blood (Routine x 2)     Status: Abnormal   Collection Time: 10/09/22  9:52 PM   Specimen: BLOOD  Result Value Ref Range Status   Specimen Description BLOOD RIGHT ANTECUBITAL  Final   Special Requests   Final    BOTTLES DRAWN AEROBIC AND ANAEROBIC Blood Culture results may not be optimal due to an inadequate volume of blood received in culture bottles   Culture  Setup Time   Final    GRAM POSITIVE COCCI IN CHAINS ANAEROBIC BOTTLE ONLY CRITICAL RESULT CALLED TO, READ  BACK BY AND VERIFIED WITH: PHARMD AUSTIN PAYTES ON 10/10/2022 @ 1516 BY DRT Performed at [facility], [address], [place], [place] [address]    Culture ENTEROCOCCUS FAECALIS (A)  Final   Report Status 10/12/2022 FINAL  Final   Organism ID, Bacteria ENTEROCOCCUS FAECALIS  Final      Susceptibility   Enterococcus faecalis - MIC*    AMPICILLIN <=2 SENSITIVE Sensitive     VANCOMYCIN 2 SENSITIVE Sensitive     GENTAMICIN SYNERGY SENSITIVE Sensitive     LINEZOLID Value in next row Sensitive      SENSITIVEMIC 2    * ENTEROCOCCUS FAECALIS  Blood Culture ID Panel (Reflexed)     Status: Abnormal   Collection Time: 10/09/22  9:52 PM  Result Value Ref Range Status   Enterococcus faecalis DETECTED (A) NOT DETECTED Final    Comment: CRITICAL RESULT CALLED TO, READ BACK BY AND VERIFIED WITH: PHARMD AUSTIN PAYTES ON 10/10/2022 @ 1516 BY DRT    Enterococcus Faecium NOT DETECTED NOT DETECTED Final   Listeria monocytogenes NOT DETECTED NOT DETECTED Final   Staphylococcus species NOT DETECTED NOT DETECTED Final   Staphylococcus aureus (BCID) NOT DETECTED NOT DETECTED  Final   Staphylococcus epidermidis NOT DETECTED NOT DETECTED Final   Staphylococcus lugdunensis NOT DETECTED NOT DETECTED Final   Streptococcus species NOT DETECTED NOT DETECTED Final   Streptococcus agalactiae NOT DETECTED NOT DETECTED Final   Streptococcus pneumoniae NOT DETECTED NOT DETECTED Final   Streptococcus pyogenes NOT DETECTED NOT DETECTED Final   A.calcoaceticus-baumannii NOT DETECTED NOT DETECTED Final   Bacteroides fragilis NOT DETECTED NOT DETECTED Final   Enterobacterales NOT DETECTED NOT DETECTED Final   Enterobacter cloacae complex NOT DETECTED NOT DETECTED Final   Escherichia coli NOT DETECTED NOT DETECTED Final   Klebsiella aerogenes NOT DETECTED NOT DETECTED Final   Klebsiella oxytoca NOT DETECTED NOT DETECTED Final   Klebsiella pneumoniae NOT DETECTED NOT DETECTED Final   Proteus species NOT DETECTED NOT DETECTED Final   Salmonella species NOT DETECTED NOT DETECTED Final   Serratia marcescens NOT DETECTED NOT DETECTED Final   Haemophilus influenzae NOT DETECTED NOT DETECTED Final   Neisseria meningitidis NOT DETECTED NOT DETECTED Final   Pseudomonas aeruginosa NOT DETECTED NOT DETECTED Final   Stenotrophomonas maltophilia NOT DETECTED NOT DETECTED Final   Candida albicans NOT DETECTED NOT DETECTED Final   Candida auris NOT DETECTED NOT DETECTED Final   Candida glabrata NOT DETECTED NOT DETECTED Final   Candida krusei NOT DETECTED NOT DETECTED Final   Candida parapsilosis NOT DETECTED NOT DETECTED Final   Candida tropicalis NOT DETECTED NOT DETECTED Final   Cryptococcus neoformans/gattii NOT DETECTED NOT DETECTED Final   Vancomycin resistance NOT DETECTED NOT DETECTED Final    Comment: Performed at [facility], [address], [place], [place] [address]  Resp panel by RT-PCR (RSV, Flu A&B, Covid) Anterior Nasal Swab     Status: None   Collection Time: 10/09/22 10:21 PM   Specimen: Anterior Nasal Swab  Result Value Ref Range Status   SARS Coronavirus 2 by  RT PCR NEGATIVE NEGATIVE Final   Influenza A by PCR NEGATIVE NEGATIVE Final   Influenza B by PCR NEGATIVE NEGATIVE Final    Comment: (NOTE) The Xpert Xpress SARS-CoV-2/FLU/RSV plus assay is intended as an aid in the diagnosis of influenza from Nasopharyngeal swab specimens and should not be used as a sole basis for treatment. Nasal washings and aspirates are unacceptable for Xpert Xpress SARS-CoV-2/FLU/RSV testing.  Fact Sheet for Patients: BloggerCourse.com  Fact Sheet for Healthcare Providers: SeriousBroker.it  This test is not yet approved or cleared by the [place] FDA and has been authorized for detection and/or diagnosis of SARS-CoV-2 by FDA under an Emergency Use Authorization (EUA). This EUA will remain in effect (meaning this test can be used) for the duration of the COVID-19 declaration under Section 564(b)(1) of the Act, 21 U.S.C. section 360bbb-3(b)(1), unless the authorization is terminated or revoked.     Resp Syncytial Virus by PCR NEGATIVE NEGATIVE Final    Comment: (NOTE) Fact Sheet for Patients: BloggerCourse.com  Fact Sheet for Healthcare Providers: SeriousBroker.it  This test is not yet approved or cleared by the [place] FDA and has been authorized for detection and/or diagnosis of SARS-CoV-2 by FDA under an Emergency Use Authorization (EUA). This EUA will remain in effect (meaning this test can be used) for the duration of the COVID-19 declaration under Section 564(b)(1) of the Act, 21 U.S.C. section 360bbb-3(b)(1), unless the authorization is terminated or revoked.  Performed at [facility], [address] [place], [place] [address]     Antimicrobials: Anti-infectives (From admission, onward)    Start     Dose/Rate Route Frequency Ordered Stop   10/10/22 2200  ceFEPIme (MAXIPIME) 1 g in sodium chloride 0.9 % 100 mL IVPB  Status:   Discontinued        1 g 200 mL/hr over 30 Minutes Intravenous Every 24 hours 10/09/22 2306 10/10/22 1540   10/10/22 1800  metroNIDAZOLE (FLAGYL) IVPB 500 mg  Status:  Discontinued        500 mg 100 mL/hr over 60 Minutes Intravenous Every 8 hours 10/10/22 1030 10/10/22 1540   10/10/22 1630  Ampicillin-Sulbactam (UNASYN) 3 g in sodium chloride 0.9 % 100 mL IVPB        3 g 200 mL/hr over 30 Minutes Intravenous Every 24 hours 10/10/22 1540     10/10/22 1000  metroNIDAZOLE (FLAGYL) IVPB 500 mg  Status:  Discontinued        500 mg 100 mL/hr over 60 Minutes Intravenous Every 12 hours 10/10/22 0112 10/10/22 1030   10/10/22 0230  vancomycin (VANCOCIN) capsule 500 mg        500 mg Oral 3 times daily before meals & bedtime 10/10/22 0124 10/23/22 2159   10/09/22 2305  vancomycin variable dose per unstable renal function (pharmacist dosing)  Status:  Discontinued         Does not apply See admin instructions 10/09/22 2306 10/10/22 1027   10/09/22 2230  vancomycin (VANCOREADY) IVPB 1250 mg/250 mL        1,250 mg 166.7 mL/hr over 90 Minutes Intravenous  Once 10/09/22 2217 10/10/22 0030   10/09/22 2215  ceFEPIme (MAXIPIME) 2 g in sodium chloride 0.9 % 100 mL IVPB  Status:  Discontinued        2 g 200 mL/hr over 30 Minutes Intravenous  Once 10/09/22 2207 10/09/22 2310   10/09/22 2215  metroNIDAZOLE (FLAGYL) IVPB 500 mg        500 mg 100 mL/hr over 60 Minutes Intravenous  Once 10/09/22 2207 10/09/22 2341   10/09/22 2215  vancomycin (VANCOCIN) IVPB 1000 mg/200 mL premix  Status:  Discontinued        1,000 mg 200 mL/hr over 60 Minutes Intravenous  Once 10/09/22 2207 10/09/22 2217      Culture/Microbiology    Component Value Date/Time   SDES BLOOD RIGHT ANTECUBITAL 10/09/2022 2152   SPECREQUEST  10/09/2022 2152    BOTTLES DRAWN AEROBIC AND ANAEROBIC Blood Culture results may not be optimal due to an inadequate volume of blood received in culture bottles   CULT ENTEROCOCCUS FAECALIS (A) 10/09/2022  2152   REPTSTATUS 10/12/2022 FINAL 10/09/2022 2152    Radiology Studies: No results found.   LOS: 2 days   Lanae Boast, MD [facility]  10/12/2022, 11:39 AM

## 2022-10-12 NOTE — Plan of Care (Signed)

## 2022-10-12 NOTE — Progress Notes (Signed)
TRH night cross cover note:   I was notified by RN that this patient, who is on comfort care measures, is hypothermic, similar to that at initial presentation, with most recent oral temperature noted to be 94.1.  Patient without any reported acute complaints at this time.  I subsequently added Bair hugger for hypothermia/patient comfort.      Newton Pigg, DO Hospitalist

## 2022-10-12 NOTE — Progress Notes (Signed)
                                                                                                                                                                                                          Palliative Medicine Progress Note   Patient Name: Rick [name]       Date: 10/12/2022 DOB: [birth year]  Age: 89 y.o. MRN#: [id number] Attending Physician: Lanae Boast, MD Primary Care Physician: Joaquim Nam, MD Admit Date: 10/09/2022   HPI/Patient Profile: 89 y.o. male  with past medical history of chronic HFrEF, moderate to severe MR, history of prostate cancer, CKD stage V, anemia of chronic disease, and recent history of C. difficile infection who ended to the ED on 10/09/2022 with failure to thrive and was admitted for end-of-life care.    Palliative medicine was consulted for symptom management and support in the setting of end-of-life care.  Subjective: Chart reviewed and patient assessed at bedside. He   Discussed plan of care with wife, daughter, and son at bedside. They feel patient has "improved" and wish to finish the course of antibiotics.   They   Objective:  Physical Exam          Vital Signs: BP 108/69 (BP Location: Left Arm)   Pulse 61   Temp 97.9 F (36.6 C)   Resp 15   SpO2 98%  SpO2: SpO2: 98 % O2 Device: O2 Device: Room Air O2 Flow Rate:      LBM: Last BM Date : 10/11/22     Palliative Assessment/Data: ***     Palliative Medicine Assessment & Plan   Assessment: Principal Problem:   Shock (HCC)    Recommendations/Plan: Referral for home hospice - [facility] Family wishes patient to complete course of antibiotics Ongoing palliative support  Goals of Care and Additional Recommendations: Limitations on Scope of Treatment: {Recommended Scope and Preferences:21019}  Code Status: DNR/DNI   Prognosis:  {Palliative Care Prognosis:23504}  Discharge Planning: {Palliative dispostion:23505}  Care plan was discussed with ***  Thank you for  allowing the Palliative Medicine Team to assist in the care of this patient.   ***   Merry Proud, NP   Please contact Palliative Medicine Team phone at [phone] for questions and concerns.  For individual providers, please see AMION.

## 2022-10-13 DIAGNOSIS — R579 Shock, unspecified: Secondary | ICD-10-CM | POA: Diagnosis not present

## 2022-10-13 MED ORDER — SODIUM CHLORIDE 0.9 % IV SOLN
2.0000 g | Freq: Two times a day (BID) | INTRAVENOUS | Status: DC
  Administered 2022-10-13 – 2022-10-14 (×2): 2 g via INTRAVENOUS
  Filled 2022-10-13 (×4): qty 2000

## 2022-10-13 NOTE — TOC Progression Note (Signed)
Transition of Care [facility]) - Progression Note    Patient Details  Name: Rick [name] MRN: [id number] Date of Birth: 1928-07-10  Transition of Care [facility]) CM/SW Contact  Janae Bridgeman, RN Phone Number: 10/13/2022, 3:07 PM  Clinical Narrative:    CM met with the patient and daughter at the bedside.  The patient is now comfort care and the family has decided to take the patient home with home hospice support.  Medicare choice was provided on Friday and the patient is currently receiving antibiotics and the family would like [facility] hospice.  Referral was placed with Revonda Standard, [facility] with [facility].  DME at the home include RW and 3:1.  The patient will need a hospital bed and bedside table for home along with PTAR transport to home when medically ready for discharge.  The patient remains on room air.  The patient's daughter is currently at the bedside and I confirmed information with her in person at the bedside.     Expected Discharge Plan: Home w Hospice Care Barriers to Discharge: Continued Medical Work up  Expected Discharge Plan and Services   Discharge Planning Services: CM Consult Post Acute Care Choice:  (Patient's family provided with Home Hospice providers) Living arrangements for the past 2 months: Single Family Home                                       Social Determinants of Health (SDOH) Interventions SDOH Screenings   Food Insecurity: No Food Insecurity (09/10/2022)  Housing: Low Risk  (09/10/2022)  Transportation Needs: No Transportation Needs (09/10/2022)  Utilities: Not At Risk (09/03/2022)  Alcohol Screen: Low Risk  (11/27/2021)  Depression (PHQ2-9): Low Risk  (09/01/2022)  Financial Resource Strain: Low Risk  (11/27/2021)  Physical Activity: Insufficiently Active (11/27/2021)  Social Connections: Socially Integrated (11/27/2021)  Stress: No Stress Concern Present (11/27/2021)  Tobacco Use: Medium Risk (10/09/2022)    Readmission Risk  Interventions    10/10/2022    4:35 PM 09/03/2022    4:07 PM 07/25/2022    4:19 PM  Readmission Risk Prevention Plan  Transportation Screening Complete Complete Complete  PCP or Specialist Appt within 3-5 Days   Complete  HRI or Home Care Consult   Not Complete  HRI or Home Care Consult comments   need pt eval  Social Work Consult for Recovery Care Planning/Counseling   Not Complete  SW consult not completed comments   need pt eval  Palliative Care Screening   Not Applicable  Medication Review Oceanographer) Complete Complete Complete  PCP or Specialist appointment within 3-5 days of discharge Complete Complete   HRI or Home Care Consult Complete Complete   SW Recovery Care/Counseling Consult Complete    Palliative Care Screening Complete Not Applicable   Skilled Nursing Facility Not Applicable Not Applicable

## 2022-10-13 NOTE — Progress Notes (Signed)
Civil engineer, contracting [facility]) Hospital Liaison Note:  Received request from Transitions of Care Manager for hospice services at home after discharg  Spoke with patient and spouse to initiate education related to hospice philosophy, services and team approach to care. Patient/family verbalized understanding of information given. Per discussion, the plan is for discharge home by ambulance in the morning if medically stable once DME delivery is confirmed with family.  DME needs discussed. Patient has the following equipment in the home: 3in1 and rolling walker. Patient/family requests the following equipment for delivery: hospital bed and bedside table. Address has been verified and is correct in the chart. [name] (patient's spouse) is the  family contact to arrange time of equipment delivery and can be reached at [phone].Marland Kitchen   Please send signed and completed DNR home with patient /family. Please provide precriptions at discharge as needed to ensure ongoing symptom management.  [facility] information and contact numbers given to daughter at bedside.  Please call with any hospice related questions or concerns.  Thank you for the opportunity to participate in this patient's care.  Roda Shutters, RN [facility] Liaison [phone]

## 2022-10-13 NOTE — Progress Notes (Signed)
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 difficile infection, history of H. pylori infection, history of prostate cancer, anemia of chronic disease, iron deficiency anemia, hyperlipidemia brought to the ED with worsening lethargy x 24 to 48 hours and being minimally responsive on the day of admission Brought to the ED: Where he has been profoundly hypotensive in 60s to 70s systolic, hypoglycemic in the 81X, hyponatremia 121 hypokalemia 2.8 acute renal failure on CKD with metabolic acidosis bicarb 16, severe hypoalbuminemia, anemia hemoglobin 7, thrombocytopenia. Discussion was held with patient and wife and other kids and decided to admit for end-of-life care and comfort measures. TRH was called for admission. Patient was seen by palliative care> continue DNR, family wanted to proceed with comfort measures but want to continue on antibiotic regimen.  Blood culture grew E faecalis  Subjective: Seen and examined this morning.  Bit drowsy. Did not eat much.  Overnight hypothermic needing [employer] Wife and daughter at the bedside waiting for hospice set up at home Denies any complaint no abdominal pain  Assessment and Plan: Principal Problem:   Shock (HCC)   End-of-life care Failure to thrive secondary to severe protein calorie malnutrition/CHF/CKD Shock with hypotension hypothermia hypoglycemia:multifactorial hypovolemic/septic shock. E faecalis bacteremia Hypothermia Combined systolic and diastolic CHF with EF 30-35% AKI on CKD stage V Hypoglycemia Hypothermia Hyponatremia Metabolic acidosis Severe hypoalbuminemia and  anasarca-leg edema. Recent C. difficile infection Prolonged Qtc/LBBB Severe anemia Hypothyroidism Iron deficiency anemia BPH Hyperlipidemia Moderate to severe MR   Plan: Appreciate palliative care input>Continue current comfort measures/supportive cares, pleasure feeding. Family would like to continue antibiotics for now> changing to ampicillin per sensitivity, on discharge will switch to oral amoxicillin to complete total 14 days course discussed with pharmacy. Awaiting for hospice set up at home but overnight hypothermic he is able to talk/interact.  DVT prophylaxis: none for comport Code Status:   Code Status: DNR Family Communication: plan of care discussed with patient/wife and daughter at bedside. Patient status is:  inpatient  because of end of life care Level of care: Telemetry Medical   Dispo: The patient is from: home            Anticipated disposition: home w/ hospice Objective: Vitals last 24 hrs: Vitals:   10/12/22 0946 10/12/22 1931 10/13/22 0027 10/13/22 0910  BP: 108/69 96/62  105/68  Pulse: 61 63  68  Resp: 15 18    Temp:  (!) 94.1 F (34.5 C) (!) 94.8 F (34.9 C) (!) 94.9 F (34.9 C)  TempSrc:  Oral Oral   SpO2: 98% 100%  100%   Weight change:   Physical Examination: General exam:Very drowsy he thinks he is at home.  HEENT:Oral mucosa moist, Ear/Nose WNL grossly, dentition normal. Respiratory system: bilaterally clear BS, no use of accessory muscle Cardiovascular system: S1 & S2 +, regular rate, JVD. Gastrointestinal system: Abdomen soft,distended,BS sluggish Nervous System: bit drowsy, moving extremities and grossly nonfocal Extremities: LE ankle edema ++ Skin:No rashes,no icterus. BJY:NWGNFA muscle bulk,tone, power   Medications reviewed:  Scheduled Meds:  vancomycin  500 mg Oral TID AC & HS   Continuous Infusions:  ampicillin-sulbactam (UNASYN) IV Stopped (10/12/22 1737)  Diet Order             Diet regular Room service  appropriate? Yes; Fluid consistency: Thin  Diet effective now                  Intake/Output Summary (Last 24 hours) at 10/13/2022 1253 Last data filed at 10/13/2022 0144 Gross per 24 hour  Intake 429.94 ml  Output --  Net 429.94 ml    Net IO Since Admission: 2,879.6 mL [10/13/22 1253]  Wt Readings from Last 3 Encounters:  09/08/22 63.6 kg  09/01/22 65.8 kg  08/21/22 62.1 kg     Unresulted Labs (From admission, onward)     Start     Ordered   10/09/22 2147  Urinalysis, w/ Reflex to Culture (Infection Suspected) -Urine, Clean Catch  Once,   URGENT       Question:  Specimen Source  Answer:  Urine, Clean Catch   10/09/22 2147          Data Reviewed: I have personally reviewed following labs and imaging studies CBC: Recent Labs  Lab 10/09/22 2214  WBC 11.0*  NEUTROABS 10.4*  HGB 7.0*  HCT 20.5*  MCV 83.7  PLT 120*    Basic Metabolic Panel: Recent Labs  Lab 10/09/22 2214  NA 121*  K 2.8*  CL 91*  CO2 16*  GLUCOSE 69*  BUN 69*  CREATININE 6.28*  CALCIUM 7.3*    GFR: CrCl cannot be calculated (Unknown ideal weight.). Liver Function Tests: Recent Labs  Lab 10/09/22 2214  AST 19  ALT 9  ALKPHOS 139*  BILITOT 1.0  PROT 6.0*  ALBUMIN 1.6*    Recent Labs  Lab 10/09/22 2214  INR 1.5*    BNP (last 3 results) Recent Labs    02/17/22 1622  PROBNP 4,983*   HbA1C: No results for input(s): "HGBA1C" in the last 72 hours. CBG: Recent Labs  Lab 10/09/22 2141 10/09/22 2225 10/09/22 2300  GLUCAP 76 52* 137*    Recent Labs  Lab 10/09/22 2214 10/10/22 0115  LATICACIDVEN 1.2 1.5     Recent Results (from the past 240 hour(s))  Culture, blood (Routine x 2)     Status: Abnormal   Collection Time: 10/09/22  9:47 PM   Specimen: BLOOD LEFT ARM  Result Value Ref Range Status   Specimen Description BLOOD LEFT ARM  Final   Special Requests   Final    BOTTLES DRAWN AEROBIC AND ANAEROBIC Blood Culture results may not be optimal due to an  inadequate volume of blood received in culture bottles   Culture  Setup Time   Final    GRAM POSITIVE COCCI IN BOTH AEROBIC AND ANAEROBIC BOTTLES CRITICAL RESULT CALLED TO, READ BACK BY AND VERIFIED WITH: PHARMD AUSTIN PAYTES ON 10/10/2022 @ 1516 BY DRT    Culture (A)  Final    ENTEROCOCCUS FAECALIS SUSCEPTIBILITIES PERFORMED ON PREVIOUS CULTURE WITHIN THE LAST 5 DAYS. Performed at [facility], [address], [place], [place] [address]    Report Status 10/12/2022 FINAL  Final  Culture, blood (Routine x 2)     Status: Abnormal   Collection Time: 10/09/22  9:52 PM   Specimen: BLOOD  Result Value Ref Range Status   Specimen Description BLOOD RIGHT ANTECUBITAL  Final   Special Requests   Final    BOTTLES DRAWN AEROBIC AND ANAEROBIC Blood Culture results may not be optimal due to an inadequate volume of blood received in culture bottles   Culture  Setup Time   Final    GRAM POSITIVE COCCI IN CHAINS ANAEROBIC BOTTLE ONLY CRITICAL RESULT CALLED TO, READ BACK BY AND VERIFIED WITH: PHARMD AUSTIN PAYTES ON 10/10/2022 @ 1516 BY DRT Performed at [facility], [address], [place], [place] [address]    Culture ENTEROCOCCUS FAECALIS (A)  Final   Report Status 10/12/2022 FINAL  Final   Organism ID, Bacteria ENTEROCOCCUS FAECALIS  Final      Susceptibility   Enterococcus faecalis - MIC*    AMPICILLIN <=2 SENSITIVE Sensitive     VANCOMYCIN 2 SENSITIVE Sensitive     GENTAMICIN SYNERGY SENSITIVE Sensitive     LINEZOLID Value in next row Sensitive      SENSITIVEMIC 2    * ENTEROCOCCUS FAECALIS  Blood Culture ID Panel (Reflexed)     Status: Abnormal   Collection Time: 10/09/22  9:52 PM  Result Value Ref Range Status   Enterococcus faecalis DETECTED (A) NOT DETECTED Final    Comment: CRITICAL RESULT CALLED TO, READ BACK BY AND VERIFIED WITH: PHARMD AUSTIN PAYTES ON 10/10/2022 @ 1516 BY DRT    Enterococcus Faecium NOT DETECTED NOT DETECTED Final   Listeria monocytogenes NOT  DETECTED NOT DETECTED Final   Staphylococcus species NOT DETECTED NOT DETECTED Final   Staphylococcus aureus (BCID) NOT DETECTED NOT DETECTED Final   Staphylococcus epidermidis NOT DETECTED NOT DETECTED Final   Staphylococcus lugdunensis NOT DETECTED NOT DETECTED Final   Streptococcus species NOT DETECTED NOT DETECTED Final   Streptococcus agalactiae NOT DETECTED NOT DETECTED Final   Streptococcus pneumoniae NOT DETECTED NOT DETECTED Final   Streptococcus pyogenes NOT DETECTED NOT DETECTED Final   A.calcoaceticus-baumannii NOT DETECTED NOT DETECTED Final   Bacteroides fragilis NOT DETECTED NOT DETECTED Final   Enterobacterales NOT DETECTED NOT DETECTED Final   Enterobacter cloacae complex NOT DETECTED NOT DETECTED Final   Escherichia coli NOT DETECTED NOT DETECTED Final   Klebsiella aerogenes NOT DETECTED NOT DETECTED Final   Klebsiella oxytoca NOT DETECTED NOT DETECTED Final   Klebsiella pneumoniae NOT DETECTED NOT DETECTED Final   Proteus species NOT DETECTED NOT DETECTED Final   Salmonella species NOT DETECTED NOT DETECTED Final   Serratia marcescens NOT DETECTED NOT DETECTED Final   Haemophilus influenzae NOT DETECTED NOT DETECTED Final   Neisseria meningitidis NOT DETECTED NOT DETECTED Final   Pseudomonas aeruginosa NOT DETECTED NOT DETECTED Final   Stenotrophomonas maltophilia NOT DETECTED NOT DETECTED Final   Candida albicans NOT DETECTED NOT DETECTED Final   Candida auris NOT DETECTED NOT DETECTED Final   Candida glabrata NOT DETECTED NOT DETECTED Final   Candida krusei NOT DETECTED NOT DETECTED Final   Candida parapsilosis NOT DETECTED NOT DETECTED Final   Candida tropicalis NOT DETECTED NOT DETECTED Final   Cryptococcus neoformans/gattii NOT DETECTED NOT DETECTED Final   Vancomycin resistance NOT DETECTED NOT DETECTED Final    Comment: Performed at [facility], [address] [place], [place] [address]  Resp panel by RT-PCR (RSV, Flu A&B, Covid) Anterior Nasal Swab      Status: None   Collection Time: 10/09/22 10:21 PM   Specimen: Anterior Nasal Swab  Result Value Ref Range Status   SARS Coronavirus 2 by RT PCR NEGATIVE NEGATIVE Final   Influenza A by PCR NEGATIVE NEGATIVE Final   Influenza B by PCR NEGATIVE NEGATIVE Final    Comment: (NOTE) The Xpert Xpress SARS-CoV-2/FLU/RSV plus assay is intended as an aid in the diagnosis of influenza from Nasopharyngeal swab specimens and should  not be used as a sole basis for treatment. Nasal washings and aspirates are unacceptable for Xpert Xpress SARS-CoV-2/FLU/RSV testing.  Fact Sheet for Patients: BloggerCourse.com  Fact Sheet for Healthcare Providers: SeriousBroker.it  This test is not yet approved or cleared by the [place] FDA and has been authorized for detection and/or diagnosis of SARS-CoV-2 by FDA under an Emergency Use Authorization (EUA). This EUA will remain in effect (meaning this test can be used) for the duration of the COVID-19 declaration under Section 564(b)(1) of the Act, 21 U.S.C. section 360bbb-3(b)(1), unless the authorization is terminated or revoked.     Resp Syncytial Virus by PCR NEGATIVE NEGATIVE Final    Comment: (NOTE) Fact Sheet for Patients: BloggerCourse.com  Fact Sheet for Healthcare Providers: SeriousBroker.it  This test is not yet approved or cleared by the [place] FDA and has been authorized for detection and/or diagnosis of SARS-CoV-2 by FDA under an Emergency Use Authorization (EUA). This EUA will remain in effect (meaning this test can be used) for the duration of the COVID-19 declaration under Section 564(b)(1) of the Act, 21 U.S.C. section 360bbb-3(b)(1), unless the authorization is terminated or revoked.  Performed at [facility], [address], [place], [place] [address]     Antimicrobials: Anti-infectives (From admission, onward)     Start     Dose/Rate Route Frequency Ordered Stop   10/10/22 2200  ceFEPIme (MAXIPIME) 1 g in sodium chloride 0.9 % 100 mL IVPB  Status:  Discontinued        1 g 200 mL/hr over 30 Minutes Intravenous Every 24 hours 10/09/22 2306 10/10/22 1540   10/10/22 1800  metroNIDAZOLE (FLAGYL) IVPB 500 mg  Status:  Discontinued        500 mg 100 mL/hr over 60 Minutes Intravenous Every 8 hours 10/10/22 1030 10/10/22 1540   10/10/22 1630  Ampicillin-Sulbactam (UNASYN) 3 g in sodium chloride 0.9 % 100 mL IVPB        3 g 200 mL/hr over 30 Minutes Intravenous Every 24 hours 10/10/22 1540     10/10/22 1000  metroNIDAZOLE (FLAGYL) IVPB 500 mg  Status:  Discontinued        500 mg 100 mL/hr over 60 Minutes Intravenous Every 12 hours 10/10/22 0112 10/10/22 1030   10/10/22 0230  vancomycin (VANCOCIN) capsule 500 mg        500 mg Oral 3 times daily before meals & bedtime 10/10/22 0124 10/23/22 2159   10/09/22 2305  vancomycin variable dose per unstable renal function (pharmacist dosing)  Status:  Discontinued         Does not apply See admin instructions 10/09/22 2306 10/10/22 1027   10/09/22 2230  vancomycin (VANCOREADY) IVPB 1250 mg/250 mL        1,250 mg 166.7 mL/hr over 90 Minutes Intravenous  Once 10/09/22 2217 10/10/22 0030   10/09/22 2215  ceFEPIme (MAXIPIME) 2 g in sodium chloride 0.9 % 100 mL IVPB  Status:  Discontinued        2 g 200 mL/hr over 30 Minutes Intravenous  Once 10/09/22 2207 10/09/22 2310   10/09/22 2215  metroNIDAZOLE (FLAGYL) IVPB 500 mg        500 mg 100 mL/hr over 60 Minutes Intravenous  Once 10/09/22 2207 10/09/22 2341   10/09/22 2215  vancomycin (VANCOCIN) IVPB 1000 mg/200 mL premix  Status:  Discontinued        1,000 mg 200 mL/hr over 60 Minutes Intravenous  Once 10/09/22 2207 10/09/22 2217  Culture/Microbiology    Component Value Date/Time   SDES BLOOD RIGHT ANTECUBITAL 10/09/2022 2152   SPECREQUEST  10/09/2022 2152    BOTTLES DRAWN AEROBIC AND ANAEROBIC Blood  Culture results may not be optimal due to an inadequate volume of blood received in culture bottles   CULT ENTEROCOCCUS FAECALIS (A) 10/09/2022 2152   REPTSTATUS 10/12/2022 FINAL 10/09/2022 2152    Radiology Studies: No results found.   LOS: 3 days   Lanae Boast, MD [facility]  10/13/2022, 12:53 PM

## 2022-10-13 NOTE — Progress Notes (Signed)
                                                                                                                                                                                                          Palliative Medicine Progress Note   Patient Name: Rick [name]       Date: 10/13/2022 DOB: 03-12-1929  Age: 89 y.o. MRN#: [id number] Attending Physician: Lanae Boast, MD Primary Care Physician: Joaquim Nam, MD Admit Date: 10/09/2022  Reason for Consultation/Follow-up: {Reason for Consult:23484}  HPI/Patient Profile: 89 y.o. male  with past medical history of chronic HFrEF, moderate to severe MR, history of prostate cancer, CKD stage V, anemia of chronic disease, and recent history of C. difficile infection who ended to the ED on 10/09/2022 with failure to thrive and was admitted for end-of-life care.    Palliative medicine was consulted for symptom management and support in the setting of end-of-life care.  Subjective: ***  Objective:  Physical Exam          Vital Signs: BP 105/68 (BP Location: Right Arm)   Pulse 68   Temp (!) 94.9 F (34.9 C)   Resp 18   SpO2 100%  SpO2: SpO2: 100 % O2 Device: O2 Device: Room Air O2 Flow Rate:     Palliative Medicine Assessment & Plan   Assessment: Principal Problem:   Shock (HCC)    Recommendations/Plan: ***  Goals of Care and Additional Recommendations: Limitations on Scope of Treatment: {Recommended Scope and Preferences:21019}  Code Status:   Prognosis:  {Palliative Care Prognosis:23504}  Discharge Planning: {Palliative dispostion:23505}  Care plan was discussed with ***  Thank you for allowing the Palliative Medicine Team to assist in the care of this patient.   ***   Merry Proud, NP   Please contact Palliative Medicine Team phone at [phone] for questions and concerns.  For individual providers, please see AMION.

## 2022-10-14 ENCOUNTER — Telehealth: Payer: Self-pay | Admitting: Family Medicine

## 2022-10-14 DIAGNOSIS — R579 Shock, unspecified: Secondary | ICD-10-CM | POA: Diagnosis not present

## 2022-10-14 MED ORDER — PROCHLORPERAZINE MALEATE 5 MG PO TABS: 5.0000 mg | ORAL_TABLET | Freq: Four times a day (QID) | ORAL | 0 refills | Status: DC | PRN

## 2022-10-14 MED ORDER — AMOXICILLIN 500 MG PO CAPS: 500.0000 mg | ORAL_CAPSULE | Freq: Every day | ORAL | 0 refills | Status: DC

## 2022-10-14 NOTE — Telephone Encounter (Signed)
Star from [facility] called in and was wanting to know if Dr. Para March would serve as attending for Peyton Najjar. Thank you!

## 2022-10-14 NOTE — TOC Transition Note (Addendum)
Transition of Care [facility]) - CM/SW Discharge Note   Patient Details  Name: Rick [name] MRN: [id number] Date of Birth: May 06, 1929  Transition of Care [facility] CM/SW Contact:  Janae Bridgeman, RN Phone Number: 10/14/2022, 10:35 AM   Clinical Narrative:    CM sent a message to Revonda Standard, CM with [facility] to determine when hospital bed will be delivered to the home today for patient's discharge by PTAR back home today.  CM will coordinate PTAR transport to the home when ready.  Bedside nursing is aware.  I called the patient's wife this morning but was unable to reach her by phone at this time.  10/14/22 1059 - I met with the patient and family at the bedside and the patient's hospital bed has been delivered to the home.  PTAR was coordinated for transport to home and bedside nursing will provide the discharge instructions.   Final next level of care: Home w Hospice Care (Pending decision by family - Palliative Team to follow up) Barriers to Discharge: Continued Medical Work up   Patient Goals and CMS Choice CMS Medicare.gov Compare Post Acute Care list provided to:: Patient Choice offered to / list presented to : Patient  Discharge Placement                         Discharge Plan and Services Additional resources added to the After Visit Summary for     Discharge Planning Services: CM Consult Post Acute Care Choice:  (Patient's family provided with Home Hospice providers)                               Social Determinants of Health (SDOH) Interventions SDOH Screenings   Food Insecurity: No Food Insecurity (09/10/2022)  Housing: Low Risk  (09/10/2022)  Transportation Needs: No Transportation Needs (09/10/2022)  Utilities: Not At Risk (09/03/2022)  Alcohol Screen: Low Risk  (11/27/2021)  Depression (PHQ2-9): Low Risk  (09/01/2022)  Financial Resource Strain: Low Risk  (11/27/2021)  Physical Activity: Insufficiently Active (11/27/2021)  Social Connections: Socially  Integrated (11/27/2021)  Stress: No Stress Concern Present (11/27/2021)  Tobacco Use: Medium Risk (10/09/2022)     Readmission Risk Interventions    10/10/2022    4:35 PM 09/03/2022    4:07 PM 07/25/2022    4:19 PM  Readmission Risk Prevention Plan  Transportation Screening Complete Complete Complete  PCP or Specialist Appt within 3-5 Days   Complete  HRI or Home Care Consult   Not Complete  HRI or Home Care Consult comments   need pt eval  Social Work Consult for Recovery Care Planning/Counseling   Not Complete  SW consult not completed comments   need pt eval  Palliative Care Screening   Not Applicable  Medication Review Oceanographer) Complete Complete Complete  PCP or Specialist appointment within 3-5 days of discharge Complete Complete   HRI or Home Care Consult Complete Complete   SW Recovery Care/Counseling Consult Complete    Palliative Care Screening Complete Not Applicable   Skilled Nursing Facility Not Applicable Not Applicable

## 2022-10-14 NOTE — Discharge Summary (Signed)
Physician Discharge Summary  Rick [name]:[id number] DOB: [birth year] DOA: 10/09/2022  PCP: Joaquim Nam, MD  Admit date: 10/09/2022 Discharge date: 10/14/2022 Recommendations for Outpatient Follow-up:  Follow up with PCP in 1 weeks-call for appointment Please obtain BMP/CBC in one week  Discharge Dispo: Home w/ hospice Discharge Condition: Stable Code Status:   Code Status: DNR Diet recommendation:  Diet Order             Diet regular Room service appropriate? 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 difficile infection, history of H. pylori infection, history of prostate cancer, anemia of chronic disease, iron deficiency anemia, hyperlipidemia brought to the ED with worsening lethargy x 24 to 48 hours and being minimally responsive on the day of admission Brought to the ED: Where he has been profoundly hypotensive in 60s to 70s systolic, hypoglycemic in the 81X, hyponatremia 121 hypokalemia 2.8 acute renal failure on CKD with metabolic acidosis bicarb 16, severe hypoalbuminemia, anemia hemoglobin 7, thrombocytopenia. Discussion was held with patient and wife and other kids and decided to admit for end-of-life care and comfort measures. TRH was called for admission. Patient was seen by palliative care> continue DNR, family wanted to proceed with comfort measures but want to continue on antibiotic regimen.  Blood culture grew E faecalis> is pansensitive. Planning for dc home w/ hospice Home hospice has been arranged and patient will be discharged  Discharge Diagnoses:  Principal Problem:   Shock (HCC)  End-of-life care Failure to thrive secondary to severe  protein calorie malnutrition/CHF/CKD Shock with hypotension hypothermia hypoglycemia:multifactorial hypovolemic/septic shock. E faecalis bacteremia Hypothermia Combined systolic and diastolic CHF with EF 30-35% AKI on CKD stage V Hypoglycemia Hypothermia Hyponatremia Metabolic acidosis Severe hypoalbuminemia and anasarca-leg edema. Recent C. difficile infection Prolonged Qtc/LBBB Severe anemia Hypothyroidism Iron deficiency anemia BPH Hyperlipidemia Moderate to severe MR   Plan: Appreciate palliative care input>Continue current comfort measures/supportive cares, pleasure feeding. Family would like to continue antibiotics for now> changing to ampicillin per sensitivity, on discharge will switch to oral amoxicillin- 500 mg daily for rena;l dosing to complete total 14 days course- discussed with pharmacy. Home hospice has been set up and patient will be discharged  Consults: Patient is on the phone, palliative care medicine. Subjective: Alert awake pleasant just ate wife at bedside. Wants to go home today   Discharge Exam: Vitals:   10/13/22 1941 10/14/22 0803  BP: 103/60 110/67  Pulse: 71 71  Resp: 18 18  Temp: (!) 96 F (35.6 C) 97.7 F (36.5 C)  SpO2: 94% 97%   General: Pt is alert, awake, not in acute distress Cardiovascular: RRR, S1/S2 +, no rubs, no gallops Respiratory: CTA bilaterally, no wheezing, no rhonchi Abdominal: Soft, NT, ND, bowel sounds + Extremities: no edema, no cyanosis  Discharge Instructions   Allergies as of 10/14/2022       Reactions   Lisinopril    cough   Nsaids    Elevated Cr        Medication List     STOP taking these medications    atorvastatin 10 MG tablet Commonly known as: LIPITOR  furosemide 40 MG tablet Commonly known as: LASIX   isosorbide-hydrALAZINE 20-37.5 MG tablet Commonly known as: BIDIL   metoprolol succinate 25 MG 24 hr tablet Commonly known as: TOPROL-XL   torsemide 20 MG tablet Commonly known as:  DEMADEX       TAKE these medications    acetaminophen 325 MG tablet Commonly known as: TYLENOL Take 2 tablets (650 mg total) by mouth every 6 (six) hours as needed. What changed: reasons to take this   amoxicillin 500 MG capsule Commonly known as: AMOXIL Take 1 capsule (500 mg total) by mouth daily in the afternoon for 8 days.   ferrous sulfate 325 (65 FE) MG tablet Take 1 tablet (325 mg total) by mouth daily with breakfast.   latanoprost 0.005 % ophthalmic solution Commonly known as: XALATAN Place 1 drop into both eyes at bedtime.   levothyroxine 50 MCG tablet Commonly known as: SYNTHROID Take 50 mcg by mouth daily before breakfast.   pantoprazole 40 MG tablet Commonly known as: Protonix Take 1 tablet (40 mg total) by mouth 2 (two) times daily before a meal.   prochlorperazine 5 MG tablet Commonly known as: COMPAZINE Take 1 tablet (5 mg total) by mouth every 6 (six) hours as needed for up to 7 days for nausea or vomiting.   sodium bicarbonate 650 MG tablet Take 1,300 mg by mouth 2 (two) times daily.   tamsulosin 0.4 MG Caps capsule Commonly known as: FLOMAX Take 0.4 mg by mouth daily.   timolol 0.5 % ophthalmic solution Commonly known as: TIMOPTIC Place 1 drop into both eyes every morning.        Follow-up Information     [facility] Follow up.   Specialty: Hospice and Palliative Medicine Why: [facility] will provide Home Hospice support. Contact information: [address] [facility] [place] [address] [phone]               Allergies  Allergen Reactions   Lisinopril     cough   Nsaids     Elevated Cr    The results of significant diagnostics from this hospitalization (including imaging, microbiology, ancillary and laboratory) are listed below for reference.    Microbiology: Recent Results (from the past 240 hour(s))  Culture, blood (Routine x 2)     Status: Abnormal   Collection Time: 10/09/22  9:47 PM   Specimen:  BLOOD LEFT ARM  Result Value Ref Range Status   Specimen Description BLOOD LEFT ARM  Final   Special Requests   Final    BOTTLES DRAWN AEROBIC AND ANAEROBIC Blood Culture results may not be optimal due to an inadequate volume of blood received in culture bottles   Culture  Setup Time   Final    GRAM POSITIVE COCCI IN BOTH AEROBIC AND ANAEROBIC BOTTLES CRITICAL RESULT CALLED TO, READ BACK BY AND VERIFIED WITH: PHARMD AUSTIN PAYTES ON 10/10/2022 @ 1516 BY DRT    Culture (A)  Final    ENTEROCOCCUS FAECALIS SUSCEPTIBILITIES PERFORMED ON PREVIOUS CULTURE WITHIN THE LAST 5 DAYS. Performed at [facility], [address], [place], [place] [address]    Report Status 10/12/2022 FINAL  Final  Culture, blood (Routine x 2)     Status: Abnormal   Collection Time: 10/09/22  9:52 PM   Specimen: BLOOD  Result Value Ref Range Status   Specimen Description BLOOD RIGHT ANTECUBITAL  Final   Special Requests   Final    BOTTLES DRAWN AEROBIC AND ANAEROBIC Blood Culture results may  not be optimal due to an inadequate volume of blood received in culture bottles   Culture  Setup Time   Final    GRAM POSITIVE COCCI IN CHAINS ANAEROBIC BOTTLE ONLY CRITICAL RESULT CALLED TO, READ BACK BY AND VERIFIED WITH: PHARMD AUSTIN PAYTES ON 10/10/2022 @ 1516 BY DRT Performed at [facility], [address], [place], [place] [address]    Culture ENTEROCOCCUS FAECALIS (A)  Final   Report Status 10/12/2022 FINAL  Final   Organism ID, Bacteria ENTEROCOCCUS FAECALIS  Final      Susceptibility   Enterococcus faecalis - MIC*    AMPICILLIN <=2 SENSITIVE Sensitive     VANCOMYCIN 2 SENSITIVE Sensitive     GENTAMICIN SYNERGY SENSITIVE Sensitive     LINEZOLID Value in next row Sensitive      SENSITIVEMIC 2    * ENTEROCOCCUS FAECALIS  Blood Culture ID Panel (Reflexed)     Status: Abnormal   Collection Time: 10/09/22  9:52 PM  Result Value Ref Range Status   Enterococcus faecalis DETECTED (A) NOT DETECTED Final     Comment: CRITICAL RESULT CALLED TO, READ BACK BY AND VERIFIED WITH: PHARMD AUSTIN PAYTES ON 10/10/2022 @ 1516 BY DRT    Enterococcus Faecium NOT DETECTED NOT DETECTED Final   Listeria monocytogenes NOT DETECTED NOT DETECTED Final   Staphylococcus species NOT DETECTED NOT DETECTED Final   Staphylococcus aureus (BCID) NOT DETECTED NOT DETECTED Final   Staphylococcus epidermidis NOT DETECTED NOT DETECTED Final   Staphylococcus lugdunensis NOT DETECTED NOT DETECTED Final   Streptococcus species NOT DETECTED NOT DETECTED Final   Streptococcus agalactiae NOT DETECTED NOT DETECTED Final   Streptococcus pneumoniae NOT DETECTED NOT DETECTED Final   Streptococcus pyogenes NOT DETECTED NOT DETECTED Final   A.calcoaceticus-baumannii NOT DETECTED NOT DETECTED Final   Bacteroides fragilis NOT DETECTED NOT DETECTED Final   Enterobacterales NOT DETECTED NOT DETECTED Final   Enterobacter cloacae complex NOT DETECTED NOT DETECTED Final   Escherichia coli NOT DETECTED NOT DETECTED Final   Klebsiella aerogenes NOT DETECTED NOT DETECTED Final   Klebsiella oxytoca NOT DETECTED NOT DETECTED Final   Klebsiella pneumoniae NOT DETECTED NOT DETECTED Final   Proteus species NOT DETECTED NOT DETECTED Final   Salmonella species NOT DETECTED NOT DETECTED Final   Serratia marcescens NOT DETECTED NOT DETECTED Final   Haemophilus influenzae NOT DETECTED NOT DETECTED Final   Neisseria meningitidis NOT DETECTED NOT DETECTED Final   Pseudomonas aeruginosa NOT DETECTED NOT DETECTED Final   Stenotrophomonas maltophilia NOT DETECTED NOT DETECTED Final   Candida albicans NOT DETECTED NOT DETECTED Final   Candida auris NOT DETECTED NOT DETECTED Final   Candida glabrata NOT DETECTED NOT DETECTED Final   Candida krusei NOT DETECTED NOT DETECTED Final   Candida parapsilosis NOT DETECTED NOT DETECTED Final   Candida tropicalis NOT DETECTED NOT DETECTED Final   Cryptococcus neoformans/gattii NOT DETECTED NOT DETECTED Final    Vancomycin resistance NOT DETECTED NOT DETECTED Final    Comment: Performed at [facility], [address], [place], [place] [address]  Resp panel by RT-PCR (RSV, Flu A&B, Covid) Anterior Nasal Swab     Status: None   Collection Time: 10/09/22 10:21 PM   Specimen: Anterior Nasal Swab  Result Value Ref Range Status   SARS Coronavirus 2 by RT PCR NEGATIVE NEGATIVE Final   Influenza A by PCR NEGATIVE NEGATIVE Final   Influenza B by PCR NEGATIVE NEGATIVE Final    Comment: (NOTE) The Xpert Xpress SARS-CoV-2/FLU/RSV  plus assay is intended as an aid in the diagnosis of influenza from Nasopharyngeal swab specimens and should not be used as a sole basis for treatment. Nasal washings and aspirates are unacceptable for Xpert Xpress SARS-CoV-2/FLU/RSV testing.  Fact Sheet for Patients: BloggerCourse.com  Fact Sheet for Healthcare Providers: SeriousBroker.it  This test is not yet approved or cleared by the [place] FDA and has been authorized for detection and/or diagnosis of SARS-CoV-2 by FDA under an Emergency Use Authorization (EUA). This EUA will remain in effect (meaning this test can be used) for the duration of the COVID-19 declaration under Section 564(b)(1) of the Act, 21 U.S.C. section 360bbb-3(b)(1), unless the authorization is terminated or revoked.     Resp Syncytial Virus by PCR NEGATIVE NEGATIVE Final    Comment: (NOTE) Fact Sheet for Patients: BloggerCourse.com  Fact Sheet for Healthcare Providers: SeriousBroker.it  This test is not yet approved or cleared by the [place] FDA and has been authorized for detection and/or diagnosis of SARS-CoV-2 by FDA under an Emergency Use Authorization (EUA). This EUA will remain in effect (meaning this test can be used) for the duration of the COVID-19 declaration under Section 564(b)(1) of the Act, 21 U.S.C. section  360bbb-3(b)(1), unless the authorization is terminated or revoked.  Performed at [facility], [address] [place], [place] [address]     Procedures/Studies: DG Chest Portable 1 View  Result Date: 10/09/2022 CLINICAL DATA:  Dyspnea EXAM: PORTABLE CHEST 1 VIEW COMPARISON:  09/02/2022 FINDINGS: Small left pleural effusion has developed with associated left basilar atelectasis or infiltrate. No pneumothorax. No pleural effusion on the right. Cardiac size is stable pulmonary vascularity is normal. No acute bone abnormality. IMPRESSION: 1. Interval development of small left pleural effusion with associated left basilar atelectasis or infiltrate. Electronically Signed   By: Helyn Numbers M.D.   On: 10/09/2022 23:01    Labs: BNP (last 3 results) Recent Labs    05/31/22 2211 07/23/22 1120 09/02/22 1307  BNP 1,180.5* 1,433.9* 622.3*   Basic Metabolic Panel: Recent Labs  Lab 10/09/22 2214  NA 121*  K 2.8*  CL 91*  CO2 16*  GLUCOSE 69*  BUN 69*  CREATININE 6.28*  CALCIUM 7.3*   Liver Function Tests: Recent Labs  Lab 10/09/22 2214  AST 19  ALT 9  ALKPHOS 139*  BILITOT 1.0  PROT 6.0*  ALBUMIN 1.6*   No results for input(s): "LIPASE", "AMYLASE" in the last 168 hours. No results for input(s): "AMMONIA" in the last 168 hours. CBC: Recent Labs  Lab 10/09/22 2214  WBC 11.0*  NEUTROABS 10.4*  HGB 7.0*  HCT 20.5*  MCV 83.7  PLT 120*   Cardiac Enzymes: No results for input(s): "CKTOTAL", "CKMB", "CKMBINDEX", "TROPONINI" in the last 168 hours. BNP: Invalid input(s): "POCBNP" CBG: Recent Labs  Lab 10/09/22 2141 10/09/22 2225 10/09/22 2300  GLUCAP 76 52* 137*   D-Dimer No results for input(s): "DDIMER" in the last 72 hours. Hgb A1c No results for input(s): "HGBA1C" in the last 72 hours. Lipid Profile No results for input(s): "CHOL", "HDL", "LDLCALC", "TRIG", "CHOLHDL", "LDLDIRECT" in the last 72 hours. Thyroid function studies No results for input(s):  "TSH", "T4TOTAL", "T3FREE", "THYROIDAB" in the last 72 hours.  Invalid input(s): "FREET3" Anemia work up No results for input(s): "VITAMINB12", "FOLATE", "FERRITIN", "TIBC", "IRON", "RETICCTPCT" in the last 72 hours. Urinalysis    Component Value Date/Time   COLORURINE YELLOW 09/02/2022 2137   APPEARANCEUR CLOUDY (A) 09/02/2022 2137   LABSPEC 1.011 09/02/2022  2137   PHURINE 5.0 09/02/2022 2137   GLUCOSEU NEGATIVE 09/02/2022 2137   GLUCOSEU NEGATIVE 02/27/2022 1237   HGBUR MODERATE (A) 09/02/2022 2137   BILIRUBINUR NEGATIVE 09/02/2022 2137   KETONESUR NEGATIVE 09/02/2022 2137   PROTEINUR 30 (A) 09/02/2022 2137   UROBILINOGEN 0.2 02/27/2022 1237   NITRITE NEGATIVE 09/02/2022 2137   LEUKOCYTESUR LARGE (A) 09/02/2022 2137   Sepsis Labs Recent Labs  Lab 10/09/22 2214  WBC 11.0*   Microbiology Recent Results (from the past 240 hour(s))  Culture, blood (Routine x 2)     Status: Abnormal   Collection Time: 10/09/22  9:47 PM   Specimen: BLOOD LEFT ARM  Result Value Ref Range Status   Specimen Description BLOOD LEFT ARM  Final   Special Requests   Final    BOTTLES DRAWN AEROBIC AND ANAEROBIC Blood Culture results may not be optimal due to an inadequate volume of blood received in culture bottles   Culture  Setup Time   Final    GRAM POSITIVE COCCI IN BOTH AEROBIC AND ANAEROBIC BOTTLES CRITICAL RESULT CALLED TO, READ BACK BY AND VERIFIED WITH: PHARMD AUSTIN PAYTES ON 10/10/2022 @ 1516 BY DRT    Culture (A)  Final    ENTEROCOCCUS FAECALIS SUSCEPTIBILITIES PERFORMED ON PREVIOUS CULTURE WITHIN THE LAST 5 DAYS. Performed at [facility], [address] [place], [place] [address]    Report Status 10/12/2022 FINAL  Final  Culture, blood (Routine x 2)     Status: Abnormal   Collection Time: 10/09/22  9:52 PM   Specimen: BLOOD  Result Value Ref Range Status   Specimen Description BLOOD RIGHT ANTECUBITAL  Final   Special Requests   Final    BOTTLES DRAWN AEROBIC AND ANAEROBIC  Blood Culture results may not be optimal due to an inadequate volume of blood received in culture bottles   Culture  Setup Time   Final    GRAM POSITIVE COCCI IN CHAINS ANAEROBIC BOTTLE ONLY CRITICAL RESULT CALLED TO, READ BACK BY AND VERIFIED WITH: PHARMD AUSTIN PAYTES ON 10/10/2022 @ 1516 BY DRT Performed at [facility], [address] [place], [place] [address]    Culture ENTEROCOCCUS FAECALIS (A)  Final   Report Status 10/12/2022 FINAL  Final   Organism ID, Bacteria ENTEROCOCCUS FAECALIS  Final      Susceptibility   Enterococcus faecalis - MIC*    AMPICILLIN <=2 SENSITIVE Sensitive     VANCOMYCIN 2 SENSITIVE Sensitive     GENTAMICIN SYNERGY SENSITIVE Sensitive     LINEZOLID Value in next row Sensitive      SENSITIVEMIC 2    * ENTEROCOCCUS FAECALIS  Blood Culture ID Panel (Reflexed)     Status: Abnormal   Collection Time: 10/09/22  9:52 PM  Result Value Ref Range Status   Enterococcus faecalis DETECTED (A) NOT DETECTED Final    Comment: CRITICAL RESULT CALLED TO, READ BACK BY AND VERIFIED WITH: PHARMD AUSTIN PAYTES ON 10/10/2022 @ 1516 BY DRT    Enterococcus Faecium NOT DETECTED NOT DETECTED Final   Listeria monocytogenes NOT DETECTED NOT DETECTED Final   Staphylococcus species NOT DETECTED NOT DETECTED Final   Staphylococcus aureus (BCID) NOT DETECTED NOT DETECTED Final   Staphylococcus epidermidis NOT DETECTED NOT DETECTED Final   Staphylococcus lugdunensis NOT DETECTED NOT DETECTED Final   Streptococcus species NOT DETECTED NOT DETECTED Final   Streptococcus agalactiae NOT DETECTED NOT DETECTED Final   Streptococcus pneumoniae NOT DETECTED NOT DETECTED Final   Streptococcus pyogenes NOT  DETECTED NOT DETECTED Final   A.calcoaceticus-baumannii NOT DETECTED NOT DETECTED Final   Bacteroides fragilis NOT DETECTED NOT DETECTED Final   Enterobacterales NOT DETECTED NOT DETECTED Final   Enterobacter cloacae complex NOT DETECTED NOT DETECTED Final   Escherichia coli NOT  DETECTED NOT DETECTED Final   Klebsiella aerogenes NOT DETECTED NOT DETECTED Final   Klebsiella oxytoca NOT DETECTED NOT DETECTED Final   Klebsiella pneumoniae NOT DETECTED NOT DETECTED Final   Proteus species NOT DETECTED NOT DETECTED Final   Salmonella species NOT DETECTED NOT DETECTED Final   Serratia marcescens NOT DETECTED NOT DETECTED Final   Haemophilus influenzae NOT DETECTED NOT DETECTED Final   Neisseria meningitidis NOT DETECTED NOT DETECTED Final   Pseudomonas aeruginosa NOT DETECTED NOT DETECTED Final   Stenotrophomonas maltophilia NOT DETECTED NOT DETECTED Final   Candida albicans NOT DETECTED NOT DETECTED Final   Candida auris NOT DETECTED NOT DETECTED Final   Candida glabrata NOT DETECTED NOT DETECTED Final   Candida krusei NOT DETECTED NOT DETECTED Final   Candida parapsilosis NOT DETECTED NOT DETECTED Final   Candida tropicalis NOT DETECTED NOT DETECTED Final   Cryptococcus neoformans/gattii NOT DETECTED NOT DETECTED Final   Vancomycin resistance NOT DETECTED NOT DETECTED Final    Comment: Performed at [facility], [address] [place], [place] [address]  Resp panel by RT-PCR (RSV, Flu A&B, Covid) Anterior Nasal Swab     Status: None   Collection Time: 10/09/22 10:21 PM   Specimen: Anterior Nasal Swab  Result Value Ref Range Status   SARS Coronavirus 2 by RT PCR NEGATIVE NEGATIVE Final   Influenza A by PCR NEGATIVE NEGATIVE Final   Influenza B by PCR NEGATIVE NEGATIVE Final    Comment: (NOTE) The Xpert Xpress SARS-CoV-2/FLU/RSV plus assay is intended as an aid in the diagnosis of influenza from Nasopharyngeal swab specimens and should not be used as a sole basis for treatment. Nasal washings and aspirates are unacceptable for Xpert Xpress SARS-CoV-2/FLU/RSV testing.  Fact Sheet for Patients: BloggerCourse.com  Fact Sheet for Healthcare Providers: SeriousBroker.it  This test is not yet approved or  cleared by the [place] FDA and has been authorized for detection and/or diagnosis of SARS-CoV-2 by FDA under an Emergency Use Authorization (EUA). This EUA will remain in effect (meaning this test can be used) for the duration of the COVID-19 declaration under Section 564(b)(1) of the Act, 21 U.S.C. section 360bbb-3(b)(1), unless the authorization is terminated or revoked.     Resp Syncytial Virus by PCR NEGATIVE NEGATIVE Final    Comment: (NOTE) Fact Sheet for Patients: BloggerCourse.com  Fact Sheet for Healthcare Providers: SeriousBroker.it  This test is not yet approved or cleared by the [place] FDA and has been authorized for detection and/or diagnosis of SARS-CoV-2 by FDA under an Emergency Use Authorization (EUA). This EUA will remain in effect (meaning this test can be used) for the duration of the COVID-19 declaration under Section 564(b)(1) of the Act, 21 U.S.C. section 360bbb-3(b)(1), unless the authorization is terminated or revoked.  Performed at [facility], [address], [place], [place] [address]    Time coordinating discharge: 35 minutes  SIGNED: Lanae Boast, MD  [facility] 10/14/2022, 9:06 AM  If 7PM-7AM, please contact night-coverage [email or web address]

## 2022-10-15 NOTE — Telephone Encounter (Signed)
Called [facility] back and notified them Dr. Para March will be the attending.

## 2022-10-15 NOTE — Telephone Encounter (Signed)
[facility] called in to follow up on this request. She stated that a nurse will be heading out there today at 9:00 AM to do a hospice assessment.

## 2022-10-15 NOTE — Telephone Encounter (Signed)
Yes. Thanks.   I would like hospice help and I thank all involved.

## 2022-10-16 ENCOUNTER — Encounter (HOSPITAL_COMMUNITY): Payer: Medicare PPO

## 2022-10-17 NOTE — Telephone Encounter (Signed)
Received notification that patient passed away yesterday at 2:15 am. Hospice dx was abnormal weight loss.

## 2022-10-20 ENCOUNTER — Telehealth: Payer: Self-pay | Admitting: Family Medicine

## 2022-10-20 NOTE — Telephone Encounter (Signed)
Called, LMOVM w/o confidential info, offering support to the patient's family.  I was always glad to see this kind gentleman in clinic.

## 2022-10-20 NOTE — Telephone Encounter (Signed)
[facility] contacted the office regarding this patient, was wondering if Dr. Para March would be signing patient's death certificate. If needed please advise [phone]. Caller Irven Baltimore states if he cannot be reached a message may be left with the answering machine.

## 2022-10-21 NOTE — Telephone Encounter (Signed)
Called again, wasn't able to LMOVM.

## 2022-10-21 NOTE — Telephone Encounter (Signed)
Tried to call funeral home back but no answer and VM was not set up to leave a message as advised.

## 2022-10-21 NOTE — Telephone Encounter (Signed)
Funeral home returned call requesting an update, relayed message from below and states he will add certificate to registry today for dr. Para March to sign.

## 2022-10-21 NOTE — Telephone Encounter (Signed)
Done. Thanks.

## 2022-10-21 NOTE — Telephone Encounter (Signed)
I can work on it but I don't have the form to address it yet.  I checked the [place] registry and it isn't available for me to work on.  Please have them send it and I'll address.  Thanks.

## 2022-10-30 ENCOUNTER — Encounter (HOSPITAL_COMMUNITY): Payer: Medicare PPO

## 2022-10-30 ENCOUNTER — Encounter: Payer: Medicare PPO | Admitting: Pharmacist

## 2022-11-06 ENCOUNTER — Telehealth: Payer: Self-pay | Admitting: Family Medicine
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
## 2022-11-06 NOTE — Telephone Encounter (Signed)
Deceased patient's wife contacted the office regarding her husband, asked if she could have a call back to speak with Dr. Para March about her husband's death. Had some questions regarding the nature of it, please advise Rick [name] [phone]

## 2022-11-06 NOTE — Telephone Encounter (Signed)
Printed and put in your inbox for review

## 2022-11-06 NOTE — Telephone Encounter (Signed)
See other TE note. 
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
## 2022-11-07 NOTE — Telephone Encounter (Signed)
Please notify pt's family about the update from Dr. Thedore Mins.

## 2022-11-07 NOTE — Telephone Encounter (Signed)
Tried to call patients wife [name] but no answer and VM not set up

## 2022-11-08 DEATH — deceased

## 2022-11-12 NOTE — Telephone Encounter (Signed)
Tried to call patients wife [name] but no answer and VM not set up

## 2022-11-13 NOTE — Telephone Encounter (Signed)
Called patients wife and advised of Dr. Doristine Church message. She verbalized understanding. Will call if any further questions.

## 2022-12-03 ENCOUNTER — Ambulatory Visit: Payer: Medicare PPO | Admitting: Internal Medicine

## 2023-08-18 ENCOUNTER — Other Ambulatory Visit (HOSPITAL_COMMUNITY): Payer: Self-pay

## 2023-09-18 IMAGING — US US ABDOMEN COMPLETE
1 series · 13 of 25 positions shown · non-contrast
Comparison: 11/30/2020.

CLINICAL DATA: Abdominal distension.

EXAM:
ABDOMEN ULTRASOUND COMPLETE

[Series 1: us abdomen complete · 13 of 82 slices shown]
[im 1/82]
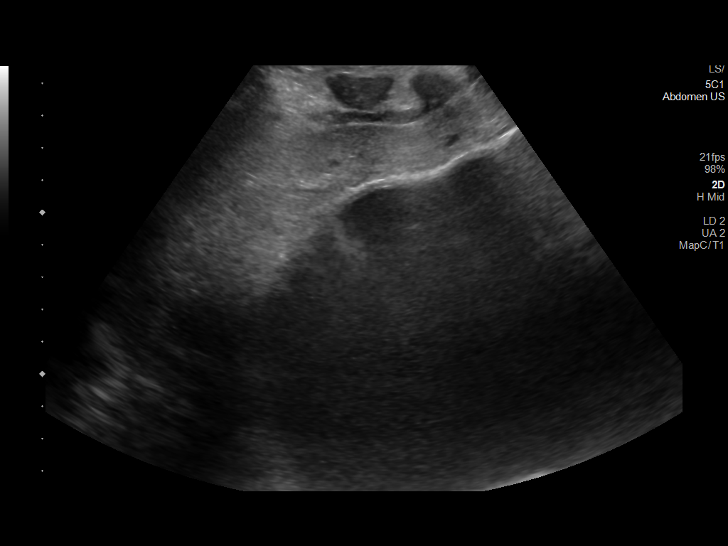
[im 7/82]
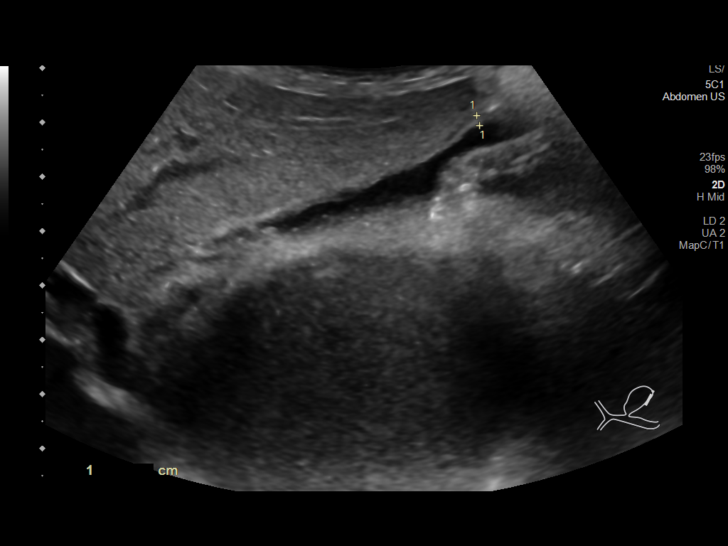
[im 14/82]
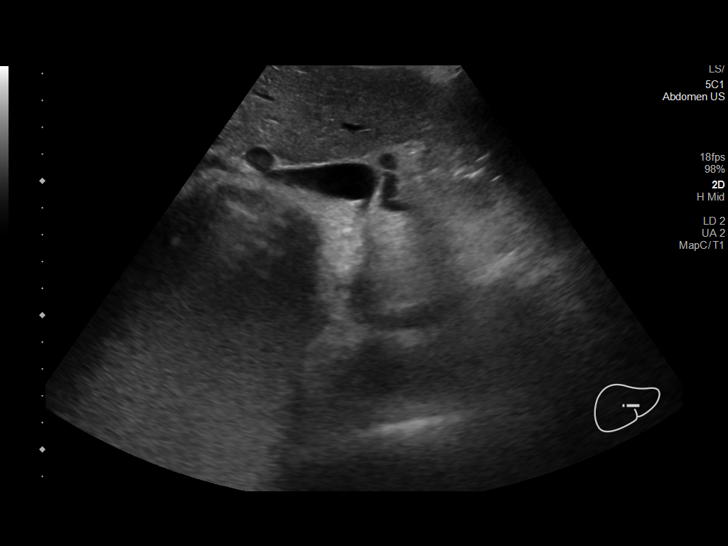
[im 21/82]
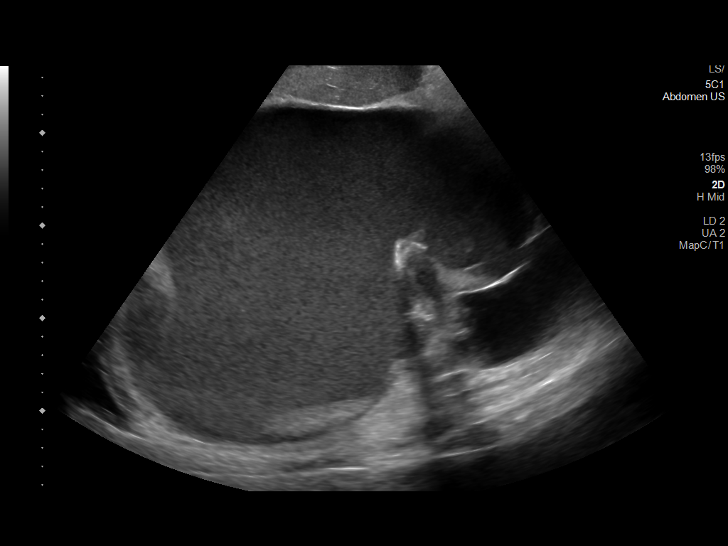
[im 28/82]
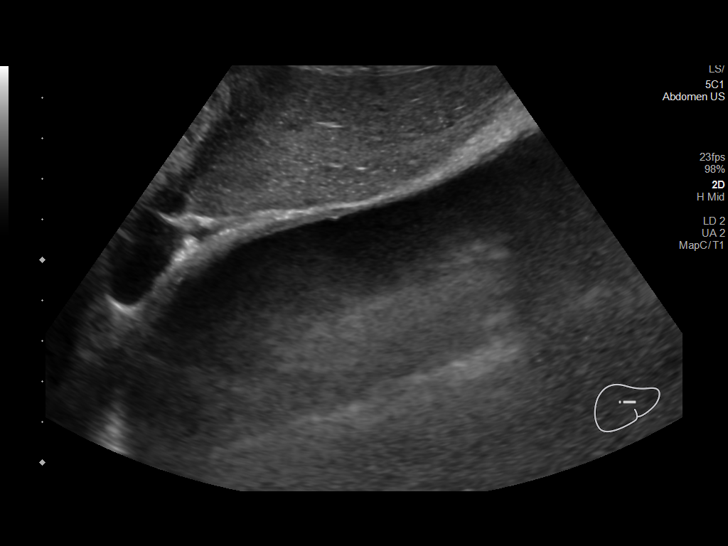
[im 34/82]
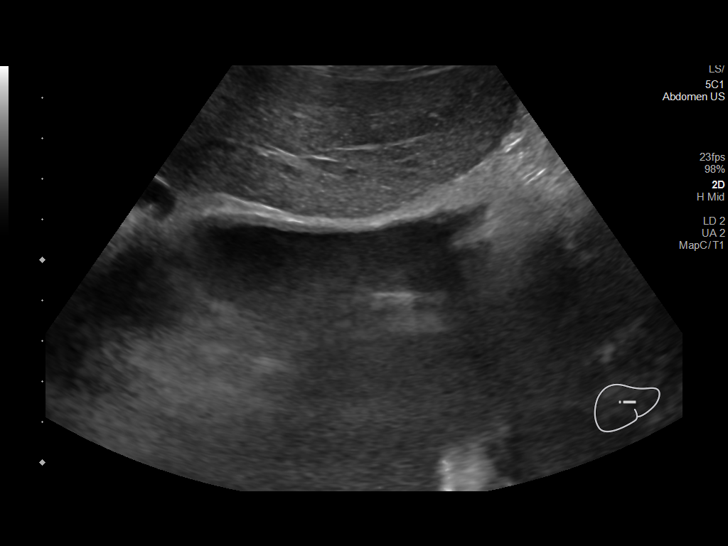
[im 41/82]
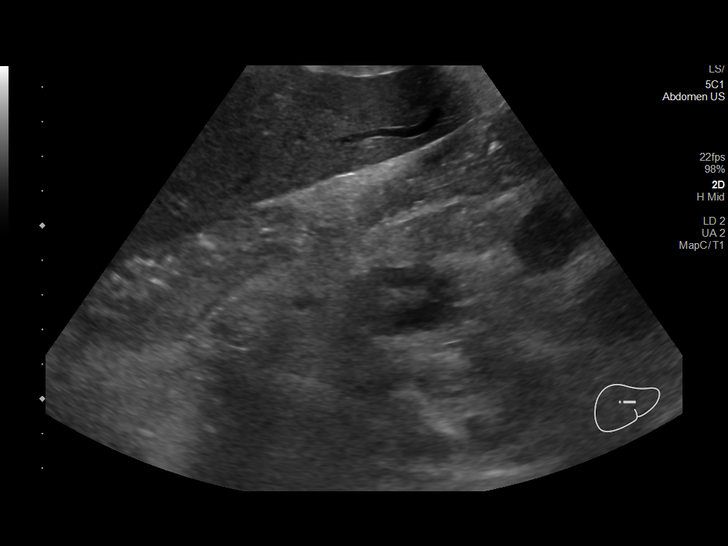
[im 48/82]
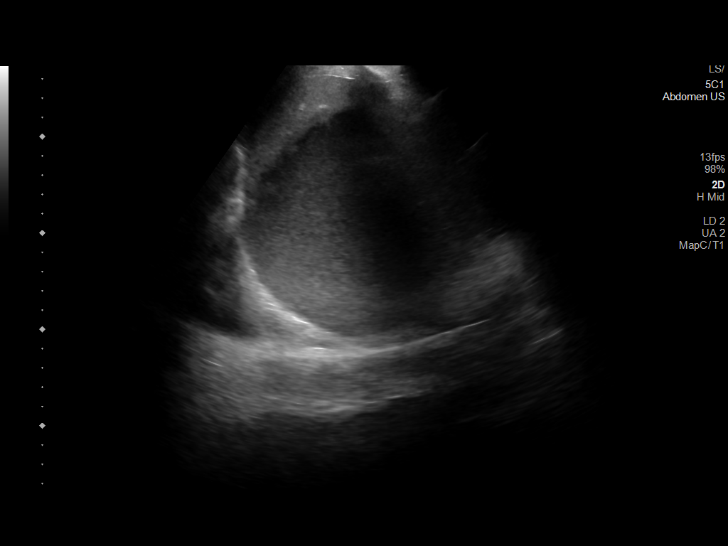
[im 55/82]
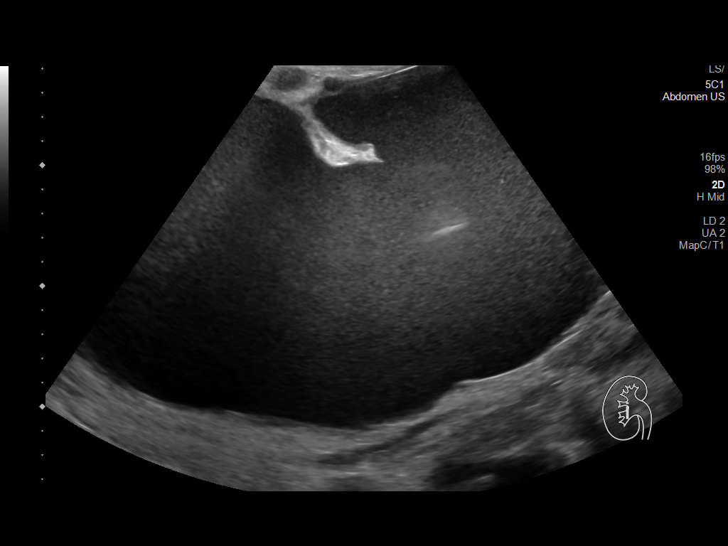
[im 61/82]
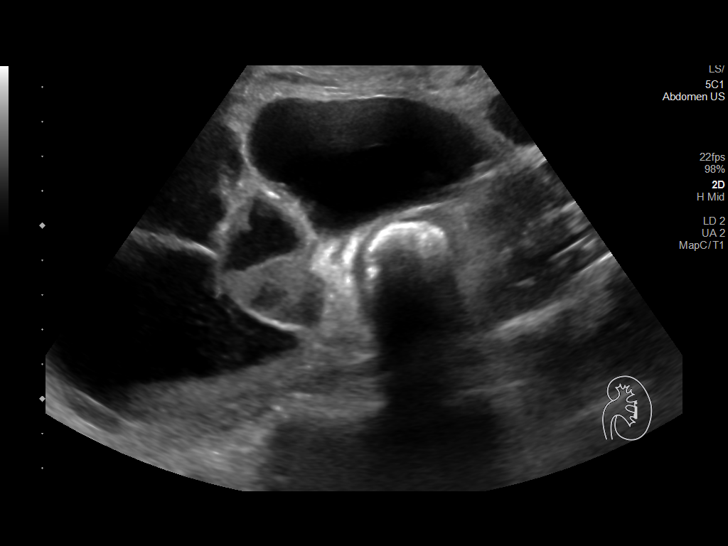
[im 68/82]
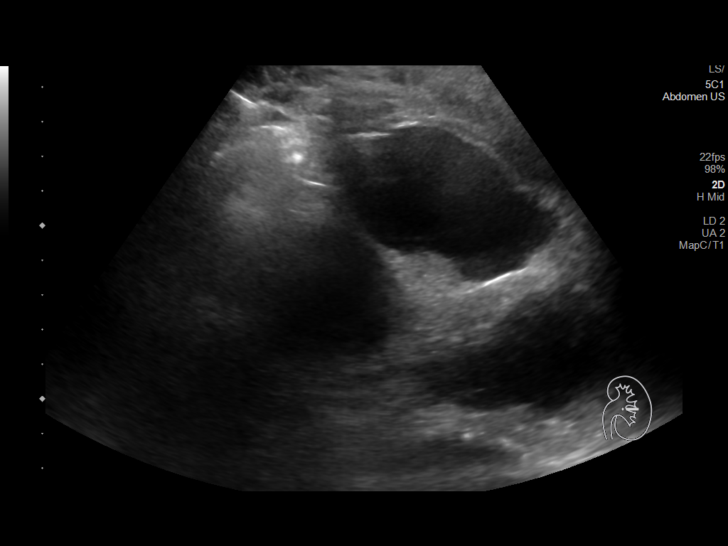
[im 75/82]
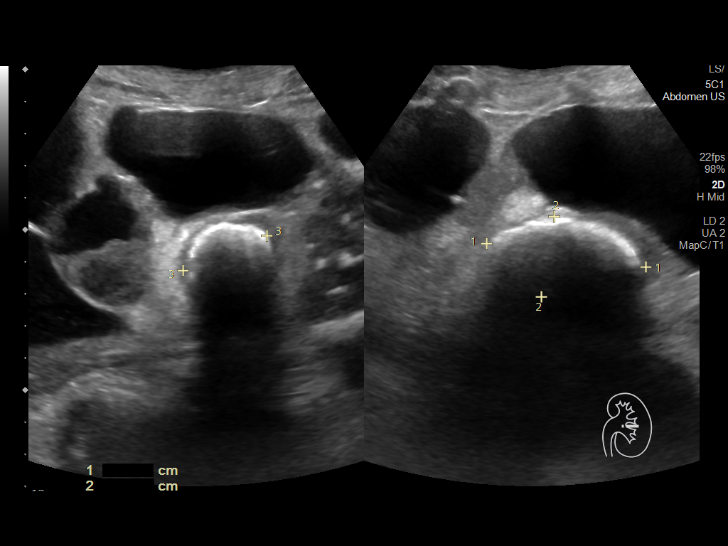
[im 82/82]
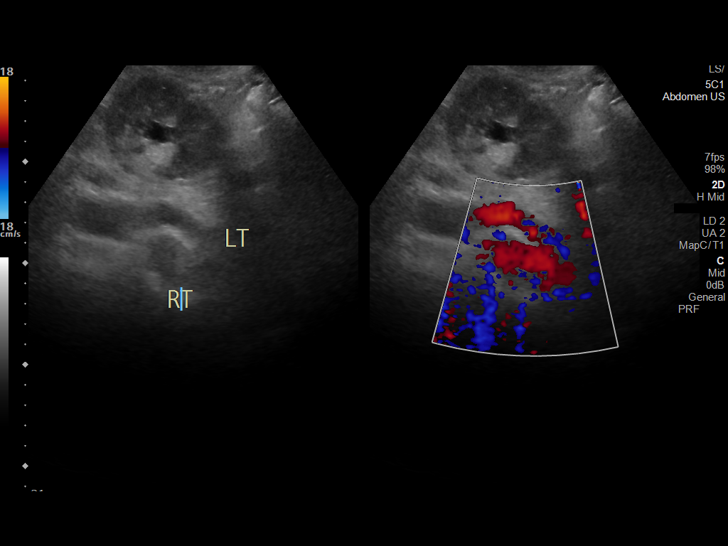

[13 of 25 positions shown; findings below may reference images not displayed]

FINDINGS: Gallbladder: There is a suspected stone in the gallbladder. No wall
thickening or pericholecystic edema. The sonographer reports a
negative Murphy sign.

Common bile duct: Diameter: 2.3 mm

Liver: No focal lesion identified. Within normal limits in
parenchymal echogenicity. Portal vein is patent on color Doppler
imaging with normal direction of blood flow towards the liver.

IVC: Not well seen on exam

Pancreas: Not well seen on exam

Spleen: Size and appearance within normal limits.

Right Kidney: Renal parenchyma is not visualized on exam. There is a
cystic structure in the right renal fossa measuring 2.4 x 1.8 cm.

Left Kidney: Renal parenchyma is not visualized on exam. There is a
cystic structure in the left renal fossa measuring 2.5 x 1.4 cm.
Calcification is noted in the region of the left renal pelvis
measuring approximately 5 cm.

Abdominal aorta: No aneurysm visualized.

Other findings: No free fluid.
IMPRESSION: 1. No evidence of renal parenchyma bilaterally with large cystic
structures in the renal fossae, unchanged from prior exams.
Calcification is noted in the region of the left renal pelvis.
2. Cholelithiasis without acute cholecystitis.

## 2024-03-09 IMAGING — DX DG CHEST 1V PORT
1 series · 1 of 1 positions shown · non-contrast
Comparison: None Available.

CLINICAL DATA: Ran over by a tractor.

EXAM:
PORTABLE CHEST 1 VIEW

[chest ap]
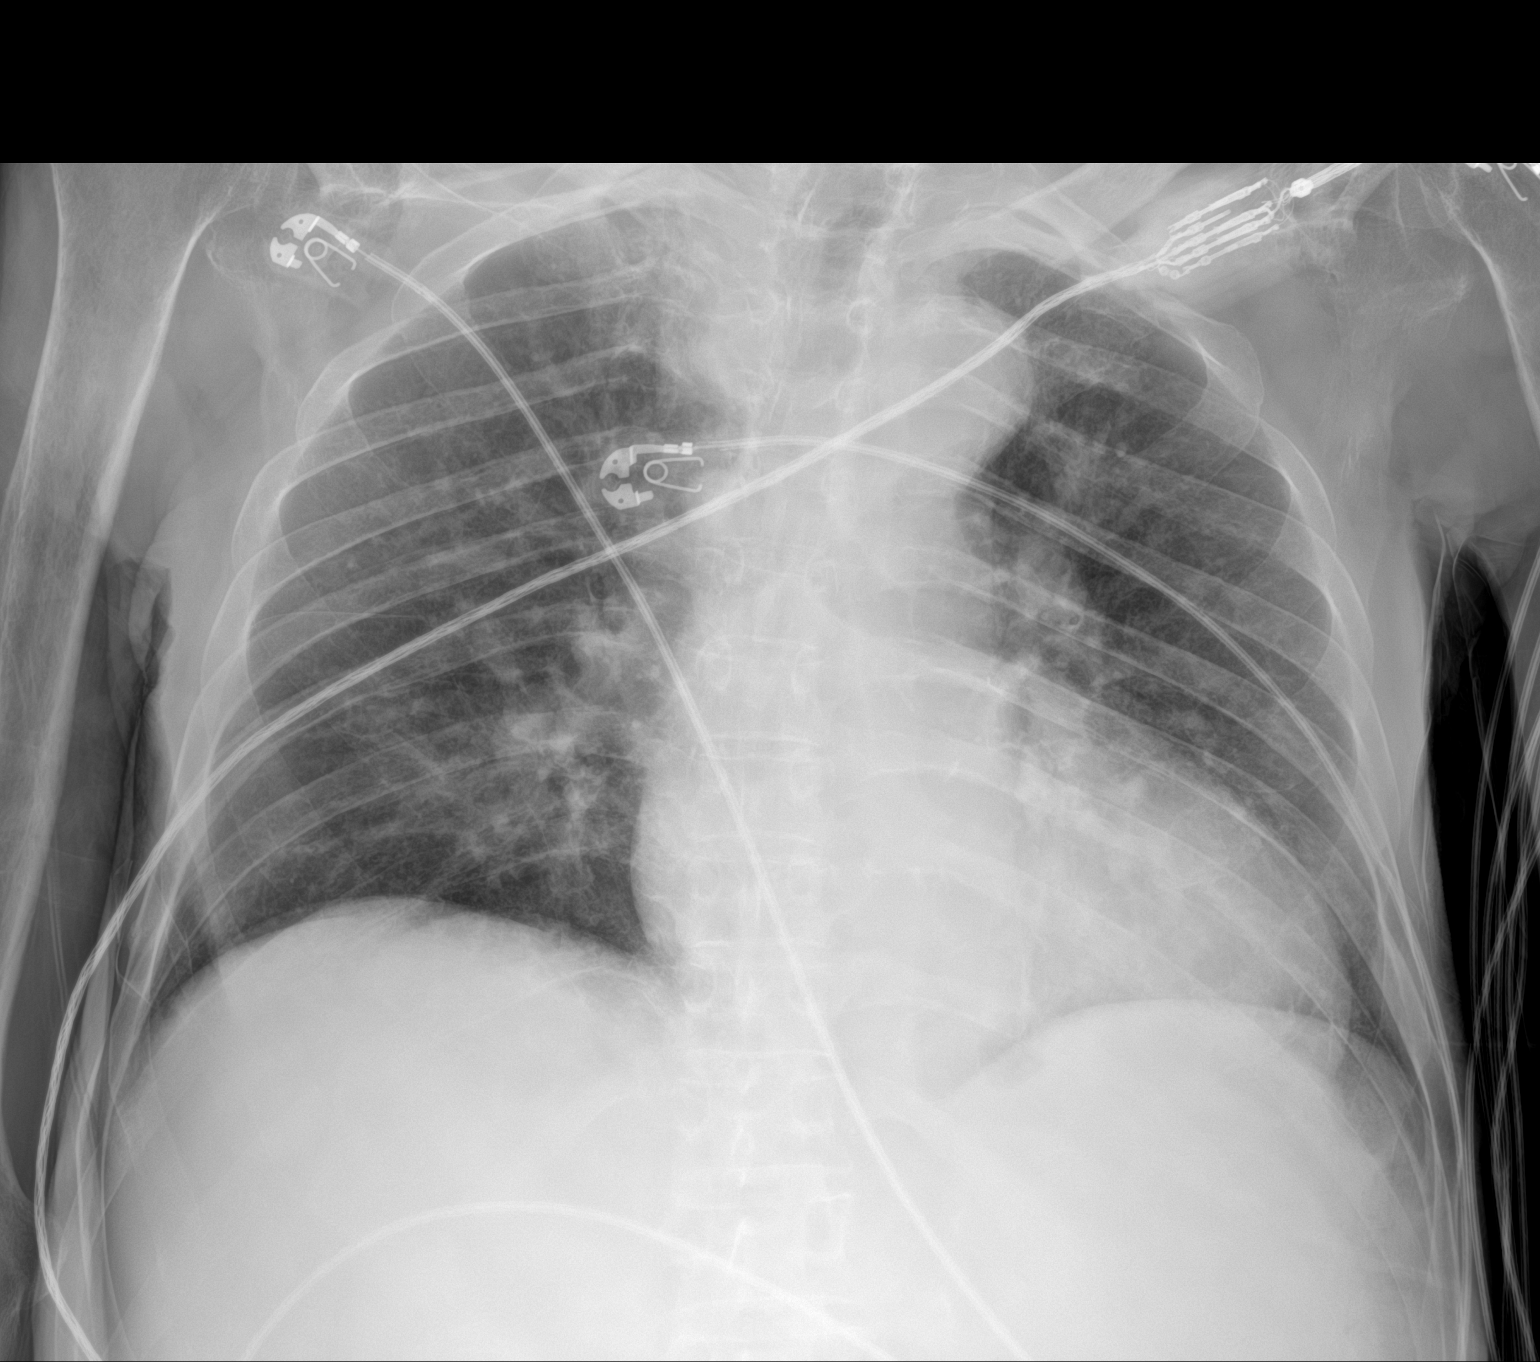

[1 of 1 positions shown; findings below may reference images not displayed]

FINDINGS: The heart is enlarged. Atherosclerotic calcification of the aortic
arch. Lungs are clear without evidence of focal consolidation or
pleural effusion. No appreciable pneumothorax. No acute displaced
fracture.
IMPRESSION: No active disease.
